# Patient Record
Sex: Male | Born: 1940
Health system: Southern US, Community
[De-identification: ages and names within clinical notes are randomized; demographics above are authoritative.]

## PROBLEM LIST (undated history)

## (undated) DIAGNOSIS — M1990 Unspecified osteoarthritis, unspecified site: Secondary | ICD-10-CM

## (undated) DIAGNOSIS — M545 Low back pain, unspecified: Secondary | ICD-10-CM

## (undated) DIAGNOSIS — F1010 Alcohol abuse, uncomplicated: Secondary | ICD-10-CM

## (undated) DIAGNOSIS — N189 Chronic kidney disease, unspecified: Secondary | ICD-10-CM

## (undated) DIAGNOSIS — G8929 Other chronic pain: Secondary | ICD-10-CM

## (undated) DIAGNOSIS — K921 Melena: Secondary | ICD-10-CM

## (undated) DIAGNOSIS — I509 Heart failure, unspecified: Secondary | ICD-10-CM

## (undated) DIAGNOSIS — K7460 Unspecified cirrhosis of liver: Secondary | ICD-10-CM

## (undated) DIAGNOSIS — J189 Pneumonia, unspecified organism: Secondary | ICD-10-CM

## (undated) DIAGNOSIS — K4090 Unilateral inguinal hernia, without obstruction or gangrene, not specified as recurrent: Secondary | ICD-10-CM

## (undated) DIAGNOSIS — K652 Spontaneous bacterial peritonitis: Secondary | ICD-10-CM

## (undated) DIAGNOSIS — I10 Essential (primary) hypertension: Secondary | ICD-10-CM

## (undated) DIAGNOSIS — D7589 Other specified diseases of blood and blood-forming organs: Secondary | ICD-10-CM

## (undated) DIAGNOSIS — I499 Cardiac arrhythmia, unspecified: Secondary | ICD-10-CM

## (undated) HISTORY — DX: Unilateral inguinal hernia, without obstruction or gangrene, not specified as recurrent: K40.90

## (undated) HISTORY — DX: Essential (primary) hypertension: I10

## (undated) HISTORY — DX: Unspecified osteoarthritis, unspecified site: M19.90

## (undated) HISTORY — DX: Melena: K92.1

## (undated) HISTORY — DX: Unspecified cirrhosis of liver: K74.60

## (undated) HISTORY — DX: Spontaneous bacterial peritonitis: K65.2

---

## 1998-01-17 ENCOUNTER — Emergency Department (HOSPITAL_COMMUNITY): Admission: EM | Admit: 1998-01-17 | Discharge: 1998-01-17 | Payer: Self-pay | Admitting: *Deleted

## 1998-01-24 ENCOUNTER — Encounter: Payer: Self-pay | Admitting: Emergency Medicine

## 1998-01-24 ENCOUNTER — Inpatient Hospital Stay (HOSPITAL_COMMUNITY): Admission: EM | Admit: 1998-01-24 | Discharge: 1998-01-28 | Payer: Self-pay | Admitting: Emergency Medicine

## 1998-01-24 ENCOUNTER — Encounter: Payer: Self-pay | Admitting: Family Medicine

## 1998-01-25 ENCOUNTER — Encounter: Payer: Self-pay | Admitting: Family Medicine

## 1998-02-02 ENCOUNTER — Encounter: Admission: RE | Admit: 1998-02-02 | Discharge: 1998-02-02 | Payer: Self-pay | Admitting: Family Medicine

## 1998-02-04 ENCOUNTER — Encounter: Admission: RE | Admit: 1998-02-04 | Discharge: 1998-02-04 | Payer: Self-pay | Admitting: Family Medicine

## 1998-03-04 ENCOUNTER — Encounter: Admission: RE | Admit: 1998-03-04 | Discharge: 1998-03-04 | Payer: Self-pay | Admitting: Family Medicine

## 1998-04-07 ENCOUNTER — Encounter: Admission: RE | Admit: 1998-04-07 | Discharge: 1998-04-07 | Payer: Self-pay | Admitting: Sports Medicine

## 1998-06-03 ENCOUNTER — Inpatient Hospital Stay (HOSPITAL_COMMUNITY): Admission: AD | Admit: 1998-06-03 | Discharge: 1998-06-05 | Payer: Self-pay | Admitting: Family Medicine

## 1998-06-19 ENCOUNTER — Encounter: Admission: RE | Admit: 1998-06-19 | Discharge: 1998-06-19 | Payer: Self-pay | Admitting: Family Medicine

## 1998-07-17 ENCOUNTER — Encounter: Admission: RE | Admit: 1998-07-17 | Discharge: 1998-07-17 | Payer: Self-pay | Admitting: Family Medicine

## 1998-10-08 ENCOUNTER — Inpatient Hospital Stay (HOSPITAL_COMMUNITY): Admission: EM | Admit: 1998-10-08 | Discharge: 1998-10-10 | Payer: Self-pay | Admitting: Emergency Medicine

## 1998-10-08 ENCOUNTER — Encounter: Payer: Self-pay | Admitting: Emergency Medicine

## 1999-01-26 ENCOUNTER — Encounter: Payer: Self-pay | Admitting: Sports Medicine

## 1999-01-26 ENCOUNTER — Inpatient Hospital Stay (HOSPITAL_COMMUNITY): Admission: EM | Admit: 1999-01-26 | Discharge: 1999-01-28 | Payer: Self-pay | Admitting: *Deleted

## 1999-01-28 ENCOUNTER — Encounter: Payer: Self-pay | Admitting: Sports Medicine

## 1999-02-03 ENCOUNTER — Encounter: Admission: RE | Admit: 1999-02-03 | Discharge: 1999-02-03 | Payer: Self-pay | Admitting: Family Medicine

## 1999-05-24 ENCOUNTER — Encounter: Admission: RE | Admit: 1999-05-24 | Discharge: 1999-05-24 | Payer: Self-pay | Admitting: Family Medicine

## 1999-05-25 ENCOUNTER — Encounter: Payer: Self-pay | Admitting: Sports Medicine

## 1999-05-25 ENCOUNTER — Encounter: Admission: RE | Admit: 1999-05-25 | Discharge: 1999-05-25 | Payer: Self-pay | Admitting: Sports Medicine

## 1999-06-03 ENCOUNTER — Encounter: Admission: RE | Admit: 1999-06-03 | Discharge: 1999-06-03 | Payer: Self-pay | Admitting: Family Medicine

## 1999-12-29 ENCOUNTER — Encounter: Admission: RE | Admit: 1999-12-29 | Discharge: 1999-12-29 | Payer: Self-pay | Admitting: Family Medicine

## 2000-01-28 ENCOUNTER — Encounter: Admission: RE | Admit: 2000-01-28 | Discharge: 2000-01-28 | Payer: Self-pay | Admitting: Family Medicine

## 2000-01-31 ENCOUNTER — Encounter: Admission: RE | Admit: 2000-01-31 | Discharge: 2000-01-31 | Payer: Self-pay | Admitting: Family Medicine

## 2000-03-08 ENCOUNTER — Inpatient Hospital Stay (HOSPITAL_COMMUNITY): Admission: AD | Admit: 2000-03-08 | Discharge: 2000-03-10 | Payer: Self-pay | Admitting: Family Medicine

## 2000-03-08 ENCOUNTER — Encounter: Admission: RE | Admit: 2000-03-08 | Discharge: 2000-03-08 | Payer: Self-pay | Admitting: Family Medicine

## 2000-04-10 ENCOUNTER — Encounter: Payer: Self-pay | Admitting: Emergency Medicine

## 2000-04-10 ENCOUNTER — Inpatient Hospital Stay (HOSPITAL_COMMUNITY): Admission: EM | Admit: 2000-04-10 | Discharge: 2000-04-12 | Payer: Self-pay | Admitting: Emergency Medicine

## 2000-04-19 ENCOUNTER — Encounter: Admission: RE | Admit: 2000-04-19 | Discharge: 2000-04-19 | Payer: Self-pay | Admitting: Family Medicine

## 2000-05-26 ENCOUNTER — Encounter: Admission: RE | Admit: 2000-05-26 | Discharge: 2000-05-26 | Payer: Self-pay | Admitting: Family Medicine

## 2000-06-30 ENCOUNTER — Encounter: Admission: RE | Admit: 2000-06-30 | Discharge: 2000-06-30 | Payer: Self-pay | Admitting: Family Medicine

## 2000-10-11 ENCOUNTER — Encounter: Admission: RE | Admit: 2000-10-11 | Discharge: 2000-10-11 | Payer: Self-pay | Admitting: Family Medicine

## 2000-12-06 ENCOUNTER — Encounter: Admission: RE | Admit: 2000-12-06 | Discharge: 2000-12-06 | Payer: Self-pay | Admitting: Family Medicine

## 2001-07-16 ENCOUNTER — Encounter: Admission: RE | Admit: 2001-07-16 | Discharge: 2001-07-16 | Payer: Self-pay | Admitting: Family Medicine

## 2001-10-24 ENCOUNTER — Encounter: Admission: RE | Admit: 2001-10-24 | Discharge: 2001-10-24 | Payer: Self-pay | Admitting: Family Medicine

## 2002-01-21 ENCOUNTER — Encounter: Admission: RE | Admit: 2002-01-21 | Discharge: 2002-01-21 | Payer: Self-pay | Admitting: Family Medicine

## 2002-02-05 ENCOUNTER — Encounter: Admission: RE | Admit: 2002-02-05 | Discharge: 2002-02-05 | Payer: Self-pay | Admitting: Family Medicine

## 2002-04-08 ENCOUNTER — Encounter: Admission: RE | Admit: 2002-04-08 | Discharge: 2002-04-08 | Payer: Self-pay | Admitting: Sports Medicine

## 2002-06-05 ENCOUNTER — Encounter: Admission: RE | Admit: 2002-06-05 | Discharge: 2002-06-05 | Payer: Self-pay | Admitting: Family Medicine

## 2002-07-15 ENCOUNTER — Encounter: Admission: RE | Admit: 2002-07-15 | Discharge: 2002-07-15 | Payer: Self-pay | Admitting: Family Medicine

## 2002-07-19 ENCOUNTER — Encounter: Admission: RE | Admit: 2002-07-19 | Discharge: 2002-07-19 | Payer: Self-pay | Admitting: Family Medicine

## 2002-09-26 ENCOUNTER — Encounter: Admission: RE | Admit: 2002-09-26 | Discharge: 2002-09-26 | Payer: Self-pay | Admitting: Family Medicine

## 2003-01-10 ENCOUNTER — Encounter: Admission: RE | Admit: 2003-01-10 | Discharge: 2003-01-10 | Payer: Self-pay | Admitting: Family Medicine

## 2003-01-14 ENCOUNTER — Encounter: Payer: Self-pay | Admitting: Sports Medicine

## 2003-01-14 ENCOUNTER — Encounter: Admission: RE | Admit: 2003-01-14 | Discharge: 2003-01-14 | Payer: Self-pay | Admitting: Sports Medicine

## 2003-01-23 ENCOUNTER — Encounter: Admission: RE | Admit: 2003-01-23 | Discharge: 2003-01-23 | Payer: Self-pay | Admitting: Family Medicine

## 2003-03-04 ENCOUNTER — Encounter: Admission: RE | Admit: 2003-03-04 | Discharge: 2003-03-04 | Payer: Self-pay | Admitting: Family Medicine

## 2003-06-30 ENCOUNTER — Encounter: Admission: RE | Admit: 2003-06-30 | Discharge: 2003-06-30 | Payer: Self-pay | Admitting: Family Medicine

## 2003-07-31 ENCOUNTER — Encounter: Admission: RE | Admit: 2003-07-31 | Discharge: 2003-07-31 | Payer: Self-pay | Admitting: Sports Medicine

## 2003-08-05 ENCOUNTER — Inpatient Hospital Stay (HOSPITAL_COMMUNITY): Admission: EM | Admit: 2003-08-05 | Discharge: 2003-08-08 | Payer: Self-pay | Admitting: *Deleted

## 2003-08-05 ENCOUNTER — Encounter: Admission: RE | Admit: 2003-08-05 | Discharge: 2003-08-05 | Payer: Self-pay | Admitting: Family Medicine

## 2003-08-12 ENCOUNTER — Encounter: Admission: RE | Admit: 2003-08-12 | Discharge: 2003-08-12 | Payer: Self-pay | Admitting: Sports Medicine

## 2003-09-11 ENCOUNTER — Encounter: Admission: RE | Admit: 2003-09-11 | Discharge: 2003-09-11 | Payer: Self-pay | Admitting: Sports Medicine

## 2004-01-28 ENCOUNTER — Ambulatory Visit: Payer: Self-pay | Admitting: Family Medicine

## 2004-03-24 ENCOUNTER — Ambulatory Visit: Payer: Self-pay | Admitting: Family Medicine

## 2004-08-11 ENCOUNTER — Ambulatory Visit: Payer: Self-pay | Admitting: Family Medicine

## 2004-12-23 ENCOUNTER — Ambulatory Visit: Payer: Self-pay | Admitting: Family Medicine

## 2004-12-23 ENCOUNTER — Encounter: Admission: RE | Admit: 2004-12-23 | Discharge: 2004-12-23 | Payer: Self-pay | Admitting: Sports Medicine

## 2005-01-07 ENCOUNTER — Ambulatory Visit: Payer: Self-pay | Admitting: Family Medicine

## 2005-03-09 ENCOUNTER — Ambulatory Visit: Payer: Self-pay | Admitting: Family Medicine

## 2005-06-09 ENCOUNTER — Ambulatory Visit: Payer: Self-pay | Admitting: Family Medicine

## 2005-09-01 ENCOUNTER — Ambulatory Visit: Payer: Self-pay | Admitting: Family Medicine

## 2006-02-10 ENCOUNTER — Ambulatory Visit: Payer: Self-pay | Admitting: Family Medicine

## 2006-07-13 DIAGNOSIS — M1990 Unspecified osteoarthritis, unspecified site: Secondary | ICD-10-CM

## 2006-07-13 DIAGNOSIS — I10 Essential (primary) hypertension: Secondary | ICD-10-CM | POA: Insufficient documentation

## 2006-07-13 DIAGNOSIS — M109 Gout, unspecified: Secondary | ICD-10-CM | POA: Insufficient documentation

## 2006-09-26 ENCOUNTER — Encounter (INDEPENDENT_AMBULATORY_CARE_PROVIDER_SITE_OTHER): Payer: Self-pay | Admitting: Family Medicine

## 2006-09-26 ENCOUNTER — Ambulatory Visit: Payer: Self-pay | Admitting: Family Medicine

## 2006-09-26 LAB — CONVERTED CEMR LAB
ALT: 38 units/L (ref 0–53)
Alkaline Phosphatase: 84 units/L (ref 39–117)
CO2: 22 meq/L (ref 19–32)
Cholesterol: 177 mg/dL (ref 0–200)
LDL Cholesterol: 73 mg/dL (ref 0–99)
Sodium: 139 meq/L (ref 135–145)
Total Bilirubin: 1 mg/dL (ref 0.3–1.2)
Total Protein: 7.8 g/dL (ref 6.0–8.3)
VLDL: 36 mg/dL (ref 0–40)

## 2006-09-27 ENCOUNTER — Encounter (INDEPENDENT_AMBULATORY_CARE_PROVIDER_SITE_OTHER): Payer: Self-pay | Admitting: Family Medicine

## 2007-04-30 ENCOUNTER — Ambulatory Visit: Payer: Self-pay | Admitting: Sports Medicine

## 2007-04-30 ENCOUNTER — Encounter (INDEPENDENT_AMBULATORY_CARE_PROVIDER_SITE_OTHER): Payer: Self-pay | Admitting: Family Medicine

## 2007-04-30 DIAGNOSIS — F17200 Nicotine dependence, unspecified, uncomplicated: Secondary | ICD-10-CM

## 2007-04-30 LAB — CONVERTED CEMR LAB
Chloride: 102 meq/L (ref 96–112)
Potassium: 4.6 meq/L (ref 3.5–5.3)

## 2007-05-01 ENCOUNTER — Encounter (INDEPENDENT_AMBULATORY_CARE_PROVIDER_SITE_OTHER): Payer: Self-pay | Admitting: Family Medicine

## 2007-05-04 ENCOUNTER — Encounter (INDEPENDENT_AMBULATORY_CARE_PROVIDER_SITE_OTHER): Payer: Self-pay | Admitting: Family Medicine

## 2007-05-07 ENCOUNTER — Encounter (INDEPENDENT_AMBULATORY_CARE_PROVIDER_SITE_OTHER): Payer: Self-pay | Admitting: Family Medicine

## 2007-06-14 ENCOUNTER — Ambulatory Visit: Payer: Self-pay | Admitting: Family Medicine

## 2007-06-14 ENCOUNTER — Encounter (INDEPENDENT_AMBULATORY_CARE_PROVIDER_SITE_OTHER): Payer: Self-pay | Admitting: Family Medicine

## 2007-06-14 LAB — CONVERTED CEMR LAB
CO2: 21 meq/L (ref 19–32)
Chloride: 103 meq/L (ref 96–112)
Glucose, Bld: 93 mg/dL (ref 70–99)
Potassium: 4.2 meq/L (ref 3.5–5.3)
Sodium: 138 meq/L (ref 135–145)

## 2007-06-18 ENCOUNTER — Encounter (INDEPENDENT_AMBULATORY_CARE_PROVIDER_SITE_OTHER): Payer: Self-pay | Admitting: Family Medicine

## 2007-07-26 ENCOUNTER — Ambulatory Visit: Payer: Self-pay | Admitting: Family Medicine

## 2007-07-26 ENCOUNTER — Encounter (INDEPENDENT_AMBULATORY_CARE_PROVIDER_SITE_OTHER): Payer: Self-pay | Admitting: Family Medicine

## 2007-07-26 LAB — CONVERTED CEMR LAB
CO2: 24 meq/L (ref 19–32)
Calcium: 9.3 mg/dL (ref 8.4–10.5)
Creatinine, Ser: 1.19 mg/dL (ref 0.40–1.50)
Glucose, Bld: 109 mg/dL — ABNORMAL HIGH (ref 70–99)

## 2007-07-27 ENCOUNTER — Encounter (INDEPENDENT_AMBULATORY_CARE_PROVIDER_SITE_OTHER): Payer: Self-pay | Admitting: Family Medicine

## 2007-08-28 ENCOUNTER — Ambulatory Visit: Payer: Self-pay | Admitting: Family Medicine

## 2007-11-20 ENCOUNTER — Ambulatory Visit: Payer: Self-pay | Admitting: Family Medicine

## 2008-02-28 ENCOUNTER — Ambulatory Visit: Payer: Self-pay | Admitting: Family Medicine

## 2008-04-25 ENCOUNTER — Ambulatory Visit: Payer: Self-pay | Admitting: Family Medicine

## 2008-04-29 ENCOUNTER — Ambulatory Visit: Payer: Self-pay | Admitting: Family Medicine

## 2008-04-29 ENCOUNTER — Encounter: Payer: Self-pay | Admitting: Family Medicine

## 2008-04-30 LAB — CONVERTED CEMR LAB
Calcium: 9.2 mg/dL (ref 8.4–10.5)
Creatinine, Ser: 1.13 mg/dL (ref 0.40–1.50)
HDL: 60 mg/dL (ref >39)
Triglycerides: 88 mg/dL (ref <150)
Uric Acid, Serum: 12.2 mg/dL — ABNORMAL HIGH (ref 4.0–7.8)

## 2008-05-12 ENCOUNTER — Telehealth: Payer: Self-pay | Admitting: Family Medicine

## 2008-06-30 ENCOUNTER — Ambulatory Visit: Payer: Self-pay | Admitting: Family Medicine

## 2008-06-30 DIAGNOSIS — E785 Hyperlipidemia, unspecified: Secondary | ICD-10-CM | POA: Insufficient documentation

## 2008-07-01 ENCOUNTER — Ambulatory Visit: Payer: Self-pay | Admitting: Family Medicine

## 2008-07-01 ENCOUNTER — Encounter: Payer: Self-pay | Admitting: Family Medicine

## 2008-07-01 LAB — CONVERTED CEMR LAB: Uric Acid, Serum: 11.9 mg/dL — ABNORMAL HIGH (ref 4.0–7.8)

## 2008-08-04 ENCOUNTER — Ambulatory Visit: Payer: Self-pay | Admitting: Family Medicine

## 2008-08-07 ENCOUNTER — Ambulatory Visit: Payer: Self-pay | Admitting: Gastroenterology

## 2008-08-07 ENCOUNTER — Telehealth: Payer: Self-pay | Admitting: Gastroenterology

## 2008-08-11 ENCOUNTER — Telehealth: Payer: Self-pay | Admitting: Gastroenterology

## 2008-08-25 ENCOUNTER — Ambulatory Visit: Payer: Self-pay | Admitting: Gastroenterology

## 2008-08-25 ENCOUNTER — Encounter: Payer: Self-pay | Admitting: Gastroenterology

## 2008-08-27 ENCOUNTER — Encounter: Payer: Self-pay | Admitting: Gastroenterology

## 2008-09-10 ENCOUNTER — Ambulatory Visit: Payer: Self-pay | Admitting: Family Medicine

## 2008-09-10 LAB — CONVERTED CEMR LAB: Hgb A1c MFr Bld: 5.1 %

## 2008-10-21 ENCOUNTER — Ambulatory Visit: Payer: Self-pay | Admitting: Family Medicine

## 2008-10-21 ENCOUNTER — Encounter: Payer: Self-pay | Admitting: Family Medicine

## 2008-11-10 LAB — CONVERTED CEMR LAB
ALT: 45 units/L (ref 0–53)
AST: 40 units/L — ABNORMAL HIGH (ref 0–37)
CO2: 24 meq/L (ref 19–32)
Chloride: 99 meq/L (ref 96–112)
Sodium: 133 meq/L — ABNORMAL LOW (ref 135–145)
Total Bilirubin: 0.6 mg/dL (ref 0.3–1.2)
Total Protein: 8.1 g/dL (ref 6.0–8.3)
Uric Acid, Serum: 8.6 mg/dL — ABNORMAL HIGH (ref 4.0–7.8)

## 2009-01-02 ENCOUNTER — Ambulatory Visit: Payer: Self-pay | Admitting: Family Medicine

## 2009-01-02 ENCOUNTER — Encounter: Payer: Self-pay | Admitting: Family Medicine

## 2009-01-02 LAB — CONVERTED CEMR LAB
BUN: 9 mg/dL (ref 6–23)
CO2: 24 meq/L (ref 19–32)
Calcium: 10 mg/dL (ref 8.4–10.5)
Creatinine, Ser: 1.12 mg/dL (ref 0.40–1.50)
Glucose, Bld: 102 mg/dL — ABNORMAL HIGH (ref 70–99)

## 2009-01-07 ENCOUNTER — Ambulatory Visit: Payer: Self-pay | Admitting: Family Medicine

## 2009-02-11 ENCOUNTER — Encounter: Payer: Self-pay | Admitting: Family Medicine

## 2009-02-11 ENCOUNTER — Ambulatory Visit: Payer: Self-pay | Admitting: Family Medicine

## 2009-02-11 LAB — CONVERTED CEMR LAB
BUN: 18 mg/dL (ref 6–23)
Calcium: 9.3 mg/dL (ref 8.4–10.5)
Creatinine, Ser: 1.07 mg/dL (ref 0.40–1.50)
Glucose, Bld: 89 mg/dL (ref 70–99)
Potassium: 3.8 meq/L (ref 3.5–5.3)
Uric Acid, Serum: 5.6 mg/dL (ref 4.0–7.8)

## 2009-02-12 ENCOUNTER — Encounter: Payer: Self-pay | Admitting: Family Medicine

## 2009-02-18 ENCOUNTER — Ambulatory Visit: Payer: Self-pay | Admitting: Family Medicine

## 2009-06-01 ENCOUNTER — Emergency Department (HOSPITAL_COMMUNITY): Admission: EM | Admit: 2009-06-01 | Discharge: 2009-06-02 | Payer: Self-pay | Admitting: Emergency Medicine

## 2009-06-19 ENCOUNTER — Ambulatory Visit: Payer: Self-pay | Admitting: Family Medicine

## 2009-06-19 LAB — CONVERTED CEMR LAB

## 2009-10-05 ENCOUNTER — Ambulatory Visit: Payer: Self-pay | Admitting: Family Medicine

## 2009-10-13 ENCOUNTER — Encounter: Payer: Self-pay | Admitting: Family Medicine

## 2009-10-13 ENCOUNTER — Ambulatory Visit: Payer: Self-pay | Admitting: Family Medicine

## 2009-10-14 LAB — CONVERTED CEMR LAB
Albumin: 4.1 g/dL (ref 3.5–5.2)
CO2: 25 meq/L (ref 19–32)
Calcium: 9.6 mg/dL (ref 8.4–10.5)
Chloride: 103 meq/L (ref 96–112)
Cholesterol: 163 mg/dL (ref 0–200)
Glucose, Bld: 121 mg/dL — ABNORMAL HIGH (ref 70–99)
LDL Cholesterol: 93 mg/dL (ref 0–99)
Total Protein: 7.5 g/dL (ref 6.0–8.3)
Triglycerides: 66 mg/dL (ref <150)

## 2009-10-26 ENCOUNTER — Encounter: Payer: Self-pay | Admitting: Family Medicine

## 2009-12-31 ENCOUNTER — Encounter: Payer: Self-pay | Admitting: Family Medicine

## 2009-12-31 ENCOUNTER — Ambulatory Visit: Payer: Self-pay | Admitting: Family Medicine

## 2010-01-01 ENCOUNTER — Encounter: Payer: Self-pay | Admitting: Family Medicine

## 2010-01-16 ENCOUNTER — Encounter: Payer: Self-pay | Admitting: Family Medicine

## 2010-01-29 ENCOUNTER — Encounter: Payer: Self-pay | Admitting: Family Medicine

## 2010-01-29 DIAGNOSIS — J45909 Unspecified asthma, uncomplicated: Secondary | ICD-10-CM | POA: Insufficient documentation

## 2010-02-16 ENCOUNTER — Ambulatory Visit: Payer: Self-pay | Admitting: Family Medicine

## 2010-02-21 ENCOUNTER — Emergency Department (HOSPITAL_COMMUNITY): Admission: EM | Admit: 2010-02-21 | Discharge: 2010-02-21 | Payer: Self-pay | Admitting: Emergency Medicine

## 2010-05-31 ENCOUNTER — Ambulatory Visit: Admission: RE | Admit: 2010-05-31 | Discharge: 2010-05-31 | Payer: Self-pay | Source: Home / Self Care

## 2010-05-31 ENCOUNTER — Encounter: Payer: Self-pay | Admitting: Family Medicine

## 2010-05-31 DIAGNOSIS — L2089 Other atopic dermatitis: Secondary | ICD-10-CM | POA: Insufficient documentation

## 2010-06-04 LAB — CONVERTED CEMR LAB
Albumin: 5 g/dL (ref 3.5–5.2)
Alkaline Phosphatase: 104 units/L (ref 39–117)
CO2: 23 meq/L (ref 19–32)
Glucose, Bld: 102 mg/dL — ABNORMAL HIGH (ref 70–99)
Potassium: 4.1 meq/L (ref 3.5–5.3)
Sodium: 133 meq/L — ABNORMAL LOW (ref 135–145)
Total Protein: 8.2 g/dL (ref 6.0–8.3)

## 2010-06-10 ENCOUNTER — Encounter (INDEPENDENT_AMBULATORY_CARE_PROVIDER_SITE_OTHER): Payer: Self-pay | Admitting: *Deleted

## 2010-06-15 NOTE — Assessment & Plan Note (Signed)
Summary: FLU SHOT/KH  Nurse Visit   Vital Signs:  Patient profile:   70 year old male Temp:     98.9 degrees F  Vitals Entered By: Marcell Barlow RN (February 16, 2010 9:49 AM)  Allergies: No Known Drug Allergies  Immunizations Administered:  Influenza Vaccine # 1:    Vaccine Type: Fluvax MCR    Site: left deltoid    Mfr: GlaxoSmithKline    Dose: 0.5 ml    Route: IM    Given by: Marcell Barlow RN    Exp. Date: 11/10/2010    Lot #: [id number]    VIS given: 12/08/09 version given February 16, 2010.  Flu Vaccine Consent Questions:    Do you have a history of severe allergic reactions to this vaccine? no    Any prior history of allergic reactions to egg and/or gelatin? no    Do you have a sensitivity to the preservative Thimersol? no    Do you have a past history of Guillan-Barre Syndrome? no    Do you currently have an acute febrile illness? no    Have you ever had a severe reaction to latex? no    Vaccine information given and explained to patient? yes  Orders Added: 1)  Influenza Vaccine MCR [00025] 2)  Administration Flu vaccine - MCR [id number]

## 2010-06-15 NOTE — Miscellaneous (Signed)
Summary: Update problem list  Clinical Lists Changes  Problems: Removed problem of ASTHMA, PERSISTENT, MILD (ICD-493.90) Added new problem of ASTHMA, PERSISTENT (ICD-493.90)

## 2010-06-15 NOTE — Miscellaneous (Signed)
Summary: Correction in asthma Dx  Clinical Lists Changes  Problems: Removed problem of ASTHMA, UNSPECIFIED (ICD-493.90) Added new problem of ASTHMA, PERSISTENT, MILD (ICD-493.90)

## 2010-06-15 NOTE — Assessment & Plan Note (Signed)
Summary: SWOLLEN KNEE/KH   Vital Signs:  Patient profile:   70 year old male Height:      69 inches Weight:      182.2 pounds BMI:     27.00 Temp:     98.8 degrees F oral Pulse rate:   68 / minute BP sitting:   150 / 71  (left arm) Cuff size:   regular  Vitals Entered By: Levert Feinstein LPN (August 18, 624THL 3:13 PM) CC: left knee pain x 4 days Is Patient Diabetic? No Pain Assessment Patient in pain? yes     Location: left knee   Primary Lillian Tigges:  Eugenie Norrie  MD  CC:  left knee pain x 4 days.  History of Present Illness: 1. L knee pain.  Noticed it on Monday morning when he woke up. Better today than when he called for appt on Tuesday. Denies hitting knee, falling on knee or any other trauma to knee. Taking arthritis meds and Tylenol; have been helping. Able to walk okay now, though stiff. Hurts when moving. Uses walker occasionally. This has happened before, resolved on its own and with meds.   Arthritis also in [facility], but no pain.  Denies other pains, rashes, fevers.  2. Gout. Last took med for acute attack in foot last week. Resolved with med.   3. Asthma. Well controlled on current meds. Hasn't used alb inhaler for a while.   Preventive Screening-Counseling & Management  Alcohol-Tobacco     Smoking Status: never  Current Medications (verified): 1)  Advair Diskus 500-50 Mcg/dose Misc (Fluticasone-Salmeterol) .... Inhale 1 Puff Twice A Day 2)  Albuterol 90 Mcg/act Aers (Albuterol) .... Inhale 2 Puff Using Inhaler Every Four Hours 3)  Lasix 40 Mg Tabs (Furosemide) .... Take 1 Tablet By Mouth Once A Day 4)  Naprosyn 500 Mg Tabs (Naproxen) .Marland Kitchen.. 1 Tablet By Mouth Twice A Day 5)  Norvasc 10 Mg Tabs (Amlodipine Besylate) .... Take 1 Tablet By Mouth Once A Day 6)  Singulair 10 Mg Tabs (Montelukast Sodium) .... Take 1 Tablet By Mouth Once A Day 7)  Lisinopril 40 Mg  Tabs (Lisinopril) .Marland Kitchen.. 1 By Mouth Daily 8)  Coreg 6.25 Mg  Tabs (Carvedilol) .Marland Kitchen.. 1 By Mouth Bid 9)   Colcrys 0.6 Mg Tabs (Colchicine) .... Take 2 Tabs By Mouth At The Onset of A Gout Attack Followed By One Tablet One Hour Later. Continue Daily Until Gout Attack Subsides. 10)  Allopurinol 300 Mg Tabs (Allopurinol) .... Take One By Mouth Daily To Prevent Gout Attacks  Allergies (verified): No Known Drug Allergies  Social History: Just returned for trip to [place]. Lives with sister Stanton Kidney and nephew (now 57 yo) in [place]. Still uses snuff all day. Denies smoking. Drinks beer occ. Walks several miles a day.  Smoking Status:  never  Review of Systems       Denies weakness, headaches, nausea, vision changes, dysuria, hematuria, constipation, hematuria, diarrhea.  Physical Exam  General:  Pleasant, optimistic; well nourished, well hydrated Head:  normocephalic and atraumatic.   Eyes:  vision grossly intact, pupils equal, pupils round, pupils reactive to light, pupils react to accomodation, no optic disk abnormalities, and no retinal abnormalitiies.   Nose:  Erythema medial R turbinate. No edema bilaterally. No rhinorrhea. Mouth:  No teeth. Pharynx pink and mosit. No erythema or lesions.  Neck:  supple, full ROM, and no masses.   Lungs:  Some mild wheezing diffusely L lobe.  R lobe clear. No crackles or dullness. Heart:  normal rate, regular rhythm, no murmur, no gallop, and no rub.   Abdomen:  soft, non-tender, normal bowel sounds, no distention, no masses, no guarding, no rigidity, and no rebound tenderness.   Extremities:  5+ strength upper extremity, 4+ strength lower. L knee: warmth, moderate intraarticular effusion, moderate tenderness laterally; full ROM  Feet: long toenails, no heavy callouses, lesions, erythema, or warmth Neurologic:  cranial nerves II-XII intact.   Gait: limping on L side but able get onto exam table independently Coordination: finger to nose slightly slower L vs. R; fingertap same bilaterally Cervical Nodes:  no anterior cervical adenopathy and no posterior  cervical adenopathy.   Psych:  memory intact for recent and remote, normally interactive, and good eye contact.   Additional Exam:  Repeat BP in room: 140/70   Impression & Recommendations:  Problem # 1:  JOINT EFFUSION, LEFT KNEE (ICD-719.06) Likely due to DJD since relieved with pain meds, afebrile, and resolving symptoms. Rec icing to decrease inflammation. Cont naproxen as needed.  Problem # 2:  GOUT, UNSPECIFIED (ICD-274.9) Controlled on allopurinol & colchicine as needed. Cont. Will check uric acid level today.   His updated medication list for this problem includes:    Naprosyn 500 Mg Tabs (Naproxen) .Marland Kitchen... 1 tablet by mouth twice a day    Colcrys 0.6 Mg Tabs (Colchicine) .Marland Kitchen... Take 2 tabs by mouth at the onset of a gout attack followed by one tablet one hour later. continue daily until gout attack subsides.    Allopurinol 300 Mg Tabs (Allopurinol) .Marland Kitchen... Take one by mouth daily to prevent gout attacks  Orders: [facility]- Est Level  3 [id number]) Uric Acid-[facility] [id number])  Problem # 3:  HYPERTENSION, BENIGN SYSTEMIC (ICD-401.1) BP controlled on current meds. Cont Lasix, Norvasc, lisinopril, & Coreg.  Pt will f/u in 6 months to re-eval.   His updated medication list for this problem includes:    Lasix 40 Mg Tabs (Furosemide) .Marland Kitchen... Take 1 tablet by mouth once a day    Norvasc 10 Mg Tabs (Amlodipine besylate) .Marland Kitchen... Take 1 tablet by mouth once a day    Lisinopril 40 Mg Tabs (Lisinopril) .Marland Kitchen... 1 by mouth daily    Coreg 6.25 Mg Tabs (Carvedilol) .Marland Kitchen... 1 by mouth bid  Orders: [place]- Est Level  3 [id number])  Complete Medication List: 1)  Advair Diskus 500-50 Mcg/dose Misc (Fluticasone-salmeterol) .... Inhale 1 puff twice a day 2)  Albuterol 90 Mcg/act Aers (Albuterol) .... Inhale 2 puff using inhaler every four hours 3)  Lasix 40 Mg Tabs (Furosemide) .... Take 1 tablet by mouth once a day 4)  Naprosyn 500 Mg Tabs (Naproxen) .Marland Kitchen.. 1 tablet by mouth twice a day 5)  Norvasc 10 Mg Tabs (Amlodipine  besylate) .... Take 1 tablet by mouth once a day 6)  Singulair 10 Mg Tabs (Montelukast sodium) .... Take 1 tablet by mouth once a day 7)  Lisinopril 40 Mg Tabs (Lisinopril) .Marland Kitchen.. 1 by mouth daily 8)  Coreg 6.25 Mg Tabs (Carvedilol) .Marland Kitchen.. 1 by mouth bid 9)  Colcrys 0.6 Mg Tabs (Colchicine) .... Take 2 tabs by mouth at the onset of a gout attack followed by one tablet one hour later. continue daily until gout attack subsides. 10)  Allopurinol 300 Mg Tabs (Allopurinol) .... Take one by mouth daily to prevent gout attacks

## 2010-06-15 NOTE — Assessment & Plan Note (Signed)
Summary: [place]   Vital Signs:  Patient profile:   70 year old male Height:      69 inches Weight:      188.4 pounds BMI:     27.92 Temp:     98.6 degrees F oral Pulse rate:   59 / minute BP sitting:   114 / 66  (left arm) Cuff size:   regular  Vitals Entered By: Levert Feinstein LPN (May 23, 624THL [id number] AM) CC: f/u asthma, gout, bp Is Patient Diabetic? No Pain Assessment Patient in pain? no        Primary Care Provider:  Eugenie Norrie  MD  CC:  f/u asthma, gout, and bp.  History of Present Illness: 1. gout Had a flare that started within the last 2 weeks -- pain in the L ankle. Now resolved. Last uric acid as 5.9 Sept 2010. on allopurinol. Take colcyrs and naproxen for acute attacks with good results .  2. Hypertension adherent to medications: yes side effects from medications:no subjective: taking medicines without problems  ROS: chest pain: no      SOB: no   HA: no     swelling: no   3. asthma Taking advair, singulair and albuterol. No increase in usage of albuterol inhaler. Has had a persistent cough from recent URI that is productive of yellow sputum but states that this is slowly improving.   Habits & Providers  Alcohol-Tobacco-Diet     Tobacco Status: current     Tobacco Counseling: to quit use of tobacco products  Current Medications (verified): 1)  Advair Diskus 500-50 Mcg/dose Misc (Fluticasone-Salmeterol) .... Inhale 1 Puff Twice A Day 2)  Albuterol 90 Mcg/act Aers (Albuterol) .... Inhale 2 Puff Using Inhaler Every Four Hours 3)  Lasix 40 Mg Tabs (Furosemide) .... Take 1 Tablet By Mouth Once A Day 4)  Naprosyn 500 Mg Tabs (Naproxen) .Marland Kitchen.. 1 Tablet By Mouth Twice A Day 5)  Norvasc 10 Mg Tabs (Amlodipine Besylate) .... Take 1 Tablet By Mouth Once A Day 6)  Singulair 10 Mg Tabs (Montelukast Sodium) .... Take 1 Tablet By Mouth Once A Day 7)  Lisinopril 40 Mg  Tabs (Lisinopril) .Marland Kitchen.. 1 By Mouth Daily 8)  Coreg 6.25 Mg  Tabs (Carvedilol) .Marland Kitchen.. 1 By Mouth Bid 9)   Colcrys 0.6 Mg Tabs (Colchicine) .... Take 2 Tabs By Mouth At The Onset of A Gout Attack Followed By One Tablet One Hour Later. Continue Daily Until Gout Attack Subsides. 10)  Allopurinol 300 Mg Tabs (Allopurinol) .... Take One By Mouth Daily To Prevent Gout Attacks  Allergies (verified): No Known Drug Allergies  Social History: Smoking Status:  current  Review of Systems       review of systems as noted in HPI section   Physical Exam  General:  vital signs reviewed and normal Alert, appropriate; well-dressed and well-nourished  Lungs:  shallow breaths but clear throughout; good air movement. no wheezes, rales, or ronchi. work of breathing normal Heart:  vital signs reviewed and normal Alert, appropriate; well-dressed and well-nourished  Msk:  L and R ankles non-tender to palpation. No swelling, erythema or warmth Extremities:  no cyanosis, clubbing, or edema    Impression & Recommendations:  Problem # 1:  GOUT, UNSPECIFIED (ICD-274.9)  check uric acid. continue current therapy.   His updated medication list for this problem includes:    Naprosyn 500 Mg Tabs (Naproxen) .Marland Kitchen... 1 tablet by mouth twice a day    Colcrys 0.6 Mg Tabs (Colchicine) .Marland KitchenMarland KitchenMarland KitchenMarland Kitchen  Take 2 tabs by mouth at the onset of a gout attack followed by one tablet one hour later. continue daily until gout attack subsides.    Allopurinol 300 Mg Tabs (Allopurinol) .Marland Kitchen... Take one by mouth daily to prevent gout attacks  Orders: Uric Acid-[facility] [id number]) [place]- Est  Level 4 [id number])  Problem # 2:  HYPERTENSION, BENIGN SYSTEMIC (ICD-401.1)  stable. continue current regimen. BP shows good control today.  His updated medication list for this problem includes:    Lasix 40 Mg Tabs (Furosemide) .Marland Kitchen... Take 1 tablet by mouth once a day    Norvasc 10 Mg Tabs (Amlodipine besylate) .Marland Kitchen... Take 1 tablet by mouth once a day    Lisinopril 40 Mg Tabs (Lisinopril) .Marland Kitchen... 1 by mouth daily    Coreg 6.25 Mg Tabs (Carvedilol) .Marland Kitchen... 1 by mouth  bid  BP today: 114/66 Prior BP: 129/74 (06/19/2009)  Labs Reviewed: K+: 3.8 (02/11/2009) Creat: : 1.07 (02/11/2009)   Chol: 190 (04/29/2008)   HDL: 60 (04/29/2008)   LDL: 112 (04/29/2008)   TG: 88 (04/29/2008)  Orders: [place]- Est  Level 4 [id number])  Problem # 3:  ASTHMA, UNSPECIFIED (ICD-493.90)  quite severe in younger years but has been stable since I have known him. Note that pt is on a nonselective beta blocker but has been stable on this regimen for many years and therefore I am not inclined to alter it.   His updated medication list for this problem includes:    Advair Diskus 500-50 Mcg/dose Misc (Fluticasone-salmeterol) ..... Inhale 1 puff twice a day    Albuterol 90 Mcg/act Aers (Albuterol) ..... Inhale 2 puff using inhaler every four hours    Singulair 10 Mg Tabs (Montelukast sodium) .Marland Kitchen... Take 1 tablet by mouth once a day  Orders: [place]- Est  Level 4 ([id number])  Complete Medication List: 1)  Advair Diskus 500-50 Mcg/dose Misc (Fluticasone-salmeterol) .... Inhale 1 puff twice a day 2)  Albuterol 90 Mcg/act Aers (Albuterol) .... Inhale 2 puff using inhaler every four hours 3)  Lasix 40 Mg Tabs (Furosemide) .... Take 1 tablet by mouth once a day 4)  Naprosyn 500 Mg Tabs (Naproxen) .Marland Kitchen.. 1 tablet by mouth twice a day 5)  Norvasc 10 Mg Tabs (Amlodipine besylate) .... Take 1 tablet by mouth once a day 6)  Singulair 10 Mg Tabs (Montelukast sodium) .... Take 1 tablet by mouth once a day 7)  Lisinopril 40 Mg Tabs (Lisinopril) .Marland Kitchen.. 1 by mouth daily 8)  Coreg 6.25 Mg Tabs (Carvedilol) .Marland Kitchen.. 1 by mouth bid 9)  Colcrys 0.6 Mg Tabs (Colchicine) .... Take 2 tabs by mouth at the onset of a gout attack followed by one tablet one hour later. continue daily until gout attack subsides. 10)  Allopurinol 300 Mg Tabs (Allopurinol) .... Take one by mouth daily to prevent gout attacks  Other Orders: Comp Met-[facility] [id number]) Lipid-[facility] [id number])  Patient Instructions: 1)  come back for a fasting  cholesterol check on Friday 2)  continue your medicines as they are 3)  I'll send in refills to [facility]. 4)  I'll send in a refill for you amlodipine to the pharmacy.  Prescriptions: ALLOPURINOL 300 MG TABS (ALLOPURINOL) take one by mouth daily to prevent gout attacks  #90 Tablet x 1   Entered and Authorized by:   Eugenie Norrie  MD   Signed by:   Eugenie Norrie  MD on 10/05/2009   Method used:   Electronically to        [facility] (retail)       [address], [place]  [id number]       Ph: [phone]       Fax: [phone]   RxID:   [id number]    Prevention & Chronic Care Immunizations   Influenza vaccine: Fluvax Non-MCR  (02/18/2009)   Influenza vaccine due: 02/27/2009    Tetanus booster: 05/17/1999: Done.   Tetanus booster due: 05/16/2009    Pneumococcal vaccine: Pneumovax  (04/25/2008)   Pneumococcal vaccine due: None    H. zoster vaccine: 06/30/2008: refused  Colorectal Screening   Hemoccult: negative  (05/07/2007)   Hemoccult action/deferral: Not indicated  (01/07/2009)   Hemoccult due: 05/2008    Colonoscopy: Location:  [place].    (08/25/2008)   Colonoscopy due: 08/2013  Other Screening   PSA: 2.89  (04/29/2008)   PSA action/deferral: Not indicated  (06/19/2009)   PSA due due: 04/29/2009   Smoking status: current  (10/05/2009)   Smoking cessation counseling: yes  (04/25/2008)  Lipids   Total Cholesterol: 190  (04/29/2008)   LDL: 112  (04/29/2008)   LDL Direct: Not documented   HDL: 60  (04/29/2008)   Triglycerides: 88  (04/29/2008)    SGOT (AST): 40  (10/21/2008)   SGPT (ALT): 45  (10/21/2008) CMP ordered    Alkaline phosphatase: 104  (10/21/2008)   Total bilirubin: 0.6  (10/21/2008)    Lipid flowsheet reviewed?: Yes   Progress toward LDL goal: Unchanged  Hypertension   Last Blood Pressure: 114 / 66  (10/05/2009)   Serum creatinine: 1.07  (02/11/2009)   Serum  potassium 3.8  (02/11/2009) CMP ordered     Hypertension flowsheet reviewed?: Yes   Progress toward BP goal: At goal  Self-Management Support :   Personal Goals (by the next clinic visit) :      Personal blood pressure goal: 140/90  (01/07/2009)     Personal LDL goal: 100  (01/07/2009)    Hypertension self-management support: Written self-care plan  (02/11/2009)    Hypertension self-management support not done because: Good outcomes  (06/19/2009)    Lipid self-management support: Written self-care plan  (02/11/2009)     Lipid self-management support not done because: Good outcomes  (06/19/2009)

## 2010-06-15 NOTE — Letter (Signed)
Summary: Generic Letter  [facility]  [address]   [place], [place] [address]   Phone: [phone]  Fax: [phone]    01/01/2010  Donald Moreno [address] [place], [place]  [address]  Dear Mr. Copher,  Your uric acid level yesterday was 7.4 mg/dL, on the high side of the normal range, 4.0-7.8 mg/dL. If you have more frequent gout flares than usual or your knee does not improve, please come in and see me.  It was a pleasure to meet you yesterday.   Sincerely,   Karen Kays MD  Appended Document: Generic Letter mailed.

## 2010-06-15 NOTE — Miscellaneous (Signed)
Summary: chart update  Pt needs recheck of fasting glucose after labs done 5/11.  Clinical Lists Changes  Observations: Added new observation of PRIMARY MD: Eugenie Norrie  MD (10/26/2009 11:18)

## 2010-06-15 NOTE — Assessment & Plan Note (Signed)
Summary: [place]   Vital Signs:  Patient profile:   70 year old male Height:      69 inches Weight:      185 pounds BMI:     27.42 BSA:     2.00 Temp:     98.5 degrees F Pulse rate:   51 / minute BP sitting:   129 / 74  Vitals Entered By: Christen Bame CMA (June 19, 2009 10:25 AM) CC: f/u Is Patient Diabetic? No Pain Assessment Patient in pain? no        Primary Care Provider:  Eugenie Norrie  MD  CC:  f/u.  History of Present Illness: 1. nosebleed Seen at ED a few weeks ago for nosebleed that he couldn't get to stop. Since then has bought a humidifier and nasal saline and hasn't had any further problems.   2. Asthma stable on current regimen. Taking advair, singulair, albuterol.   3. gout On allopurinol 300 mg daily. Last uric acid 5.8. Stable. No recent flares. Takes naproxen and colcrys with acute flares.   4. HTN  well controlled today on coreg, norvasc and lasix.  ROS: no chest pain. Breathing is at baseline. Some occasional joint aches. No back pain.   Habits & Providers  Alcohol-Tobacco-Diet     Tobacco Status: never  Current Medications (verified): 1)  Advair Diskus 500-50 Mcg/dose Misc (Fluticasone-Salmeterol) .... Inhale 1 Puff Twice A Day 2)  Albuterol 90 Mcg/act Aers (Albuterol) .... Inhale 2 Puff Using Inhaler Every Four Hours 3)  Lasix 40 Mg Tabs (Furosemide) .... Take 1 Tablet By Mouth Once A Day 4)  Naprosyn 500 Mg Tabs (Naproxen) .Marland Kitchen.. 1 Tablet By Mouth Twice A Day 5)  Norvasc 10 Mg Tabs (Amlodipine Besylate) .... Take 1 Tablet By Mouth Once A Day 6)  Singulair 10 Mg Tabs (Montelukast Sodium) .... Take 1 Tablet By Mouth Once A Day 7)  Lisinopril 40 Mg  Tabs (Lisinopril) .Marland Kitchen.. 1 By Mouth Daily 8)  Coreg 6.25 Mg  Tabs (Carvedilol) .Marland Kitchen.. 1 By Mouth Bid 9)  Colcrys 0.6 Mg Tabs (Colchicine) .... Take 2 Tabs By Mouth At The Onset of A Gout Attack Followed By One Tablet One Hour Later. Continue Daily Until Gout Attack Subsides. 10)  Allopurinol 300 Mg  Tabs (Allopurinol) .... Take One By Mouth Daily To Prevent Gout Attacks  Allergies (verified): No Known Drug Allergies  Family History: Reviewed history from 09/26/2006 and no changes required. Asthma - pretty much everyone in his family CAD - Sister with CAD prior to 62 yo No DM, no CA.  Social History: Reviewed history from 09/10/2008 and no changes required. Lives with sister Stanton Kidney in [place], uses snuff, no smoke or drugs.  1-2 beers a day.  10th-grade education.  Frequently travels to [place] and [place] (by bus) to see his kids, plans trip in about 3 months.  3 daughters in [place], 1 son in [place] with 6 grandkids.  walks a mile daily unless it rains   Physical Exam  General:  General:  Vital signs reviewed -- normal except slightly bradycardic Alert, appropriate; well-dressed and well-nourished Neck:  no carotid bruits, no JVD, no tenderness or masses Lungs:  work of breathing unlabored, clear to auscultation bilaterally; no wheezes, rales, or ronchi; shallow respirations but o/w normal  Heart:  regular rate and rhythm, no murmurs; normal s1/s2 Pulses:  DP and radial pulses 2+ bilaterally  Extremities:  no cyanosis, clubbing, or edema Neurologic:  alert and oriented. speech normal.  Additional Exam:  General:  Vital signs reviewed -- normal except slightly bradycardic Alert, appropriate; well-dressed and well-nourished Nose: nasal mucosa slightly red and boggy, no active bleeding or clotted blood.  Lungs:  work of breathing unlabored, clear to auscultation bilaterally except for occasional mild wheeze; no rales, or ronchi; shallow respirations but o/w normal  Heart:  regular rate and rhythm, no murmurs; normal s1/s2    Impression & Recommendations:  Problem # 1:  NOSEBLEED (ICD-784.7) Assessment New  resolved. Encouraged humidified air and nasal saline.   Orders: [facility]- Est  Level 4 [id number])  Problem # 2:  ASTHMA, UNSPECIFIED (ICD-493.90) Assessment: Unchanged  stable, continue  current meds.   His updated medication list for this problem includes:    Advair Diskus 500-50 Mcg/dose Misc (Fluticasone-salmeterol) ..... Inhale 1 puff twice a day    Albuterol 90 Mcg/act Aers (Albuterol) ..... Inhale 2 puff using inhaler every four hours    Singulair 10 Mg Tabs (Montelukast sodium) .Marland Kitchen... Take 1 tablet by mouth once a day  Orders: [facility]- Est  Level 4 [id number])  Problem # 3:  HYPERTENSION, BENIGN SYSTEMIC (ICD-401.1) Assessment: Unchanged  stable, continue current meds.  His updated medication list for this problem includes:    Lasix 40 Mg Tabs (Furosemide) .Marland Kitchen... Take 1 tablet by mouth once a day    Norvasc 10 Mg Tabs (Amlodipine besylate) .Marland Kitchen... Take 1 tablet by mouth once a day    Lisinopril 40 Mg Tabs (Lisinopril) .Marland Kitchen... 1 by mouth daily    Coreg 6.25 Mg Tabs (Carvedilol) .Marland Kitchen... 1 by mouth bid  Orders: [facility]- Est  Level 4 [id number])  Problem # 4:  GOUT, UNSPECIFIED (ICD-274.9) Assessment: Unchanged  stasble continue current meds.   His updated medication list for this problem includes:    Naprosyn 500 Mg Tabs (Naproxen) .Marland Kitchen... 1 tablet by mouth twice a day    Colcrys 0.6 Mg Tabs (Colchicine) .Marland Kitchen... Take 2 tabs by mouth at the onset of a gout attack followed by one tablet one hour later. continue daily until gout attack subsides.    Allopurinol 300 Mg Tabs (Allopurinol) .Marland Kitchen... Take one by mouth daily to prevent gout attacks  Orders: [facility]- Est  Level 4 (99214)  Complete Medication List: 1)  Advair Diskus 500-50 Mcg/dose Misc (Fluticasone-salmeterol) .... Inhale 1 puff twice a day 2)  Albuterol 90 Mcg/act Aers (Albuterol) .... Inhale 2 puff using inhaler every four hours 3)  Lasix 40 Mg Tabs (Furosemide) .... Take 1 tablet by mouth once a day 4)  Naprosyn 500 Mg Tabs (Naproxen) .Marland Kitchen.. 1 tablet by mouth twice a day 5)  Norvasc 10 Mg Tabs (Amlodipine besylate) .... Take 1 tablet by mouth once a day 6)  Singulair 10 Mg Tabs (Montelukast sodium) .... Take 1 tablet by mouth once  a day 7)  Lisinopril 40 Mg Tabs (Lisinopril) .Marland Kitchen.. 1 by mouth daily 8)  Coreg 6.25 Mg Tabs (Carvedilol) .Marland Kitchen.. 1 by mouth bid 9)  Colcrys 0.6 Mg Tabs (Colchicine) .... Take 2 tabs by mouth at the onset of a gout attack followed by one tablet one hour later. continue daily until gout attack subsides. 10)  Allopurinol 300 Mg Tabs (Allopurinol) .... Take one by mouth daily to prevent gout attacks  Patient Instructions: 1)  follow-up with me in 6 months 2)  you look great   Prevention & Chronic Care Immunizations   Influenza vaccine: Fluvax Non-MCR  (02/18/2009)   Influenza vaccine due: 02/27/2009    Tetanus booster: 05/17/1999: Done.  Tetanus booster due: 05/16/2009    Pneumococcal vaccine: Pneumovax  (04/25/2008)   Pneumococcal vaccine due: None    H. zoster vaccine: 06/30/2008: refused  Colorectal Screening   Hemoccult: negative  (05/07/2007)   Hemoccult action/deferral: Not indicated  (01/07/2009)   Hemoccult due: 05/2008    Colonoscopy: Location:  [facility].    (08/25/2008)   Colonoscopy due: 08/2013  Other Screening   PSA: 2.89  (04/29/2008)   PSA action/deferral: Not indicated  (06/19/2009)   PSA due due: 04/29/2009   Smoking status: never  (06/19/2009)  Lipids   Total Cholesterol: 190  (04/29/2008)   LDL: 112  (04/29/2008)   LDL Direct: Not documented   HDL: 60  (04/29/2008)   Triglycerides: 88  (04/29/2008)    SGOT (AST): 40  (10/21/2008)   SGPT (ALT): 45  (10/21/2008)   Alkaline phosphatase: 104  (10/21/2008)   Total bilirubin: 0.6  (10/21/2008)    Lipid flowsheet reviewed?: Yes   Progress toward LDL goal: Unchanged  Hypertension   Last Blood Pressure: 129 / 74  (06/19/2009)   Serum creatinine: 1.07  (02/11/2009)   Serum potassium 3.8  (02/11/2009)    Hypertension flowsheet reviewed?: Yes   Progress toward BP goal: At goal  Self-Management Support :   Personal Goals (by the next clinic visit) :      Personal blood pressure goal:  140/90  (01/07/2009)     Personal LDL goal: 100  (01/07/2009)    Hypertension self-management support: Written self-care plan  (02/11/2009)    Hypertension self-management support not done because: Good outcomes  (06/19/2009)    Lipid self-management support: Written self-care plan  (02/11/2009)     Lipid self-management support not done because: Good outcomes  (06/19/2009)

## 2010-06-17 NOTE — Assessment & Plan Note (Signed)
Summary: f/u hypertension, starting Chantix for snuff, gout/arthritis   Vital Signs:  Patient profile:   70 year old male Height:      69 inches Weight:      186.8 pounds BMI:     27.69 Temp:     98.8 degrees F oral Pulse rate:   65 / minute BP sitting:   124 / 74  (left arm) Cuff size:   regular  Vitals Entered By: Levert Feinstein LPN (January 16, [id number] 1:53 PM) CC: f/u meds Is Patient Diabetic? No Pain Assessment Patient in pain? no        Primary Provider:  Karen Kays MD  CC:  f/u meds.  History of Present Illness: 1. Abdominal rash Past several months. Keeps itching it while sleeping and during day. Has tried diaper ointment on it.  Denies fevers, drainage.  2. Arthritis Controlled with Naproxen occasionally. Has taken almost 120 tablets over past 6 months, so a fewer than 1 daily.  Denies any pain now.   3. Gout No recent flares (past 6 months).  Taking allopurinol and colchicine as needed (has not needed since he last saw me).   4. HTN Taking lasix, norvasc, lisinopril, and coreg. Denies chest pain, edema, ligthheadedness, dyspnea.   5. Mild persistent asthma Has not needed albuterol past 6 months. Also on Advair daily.   6. Wants to quit using snuff. Interested in Chantix which he tried before.     Other ROS: denies dysuria, hematuria  Preventive Screening-Counseling & Management  Alcohol-Tobacco     Alcohol drinks/day: 2     Alcohol type: beer     Alcohol Counseling: to decrease amount and/or frequency of alcohol intake     Smoking Status: never     Smoking Cessation Counseling: yes     Cans of tobacco/week: yes     Passive Smoke Exposure: no     Tobacco Counseling: to quit use of tobacco products  Allergies: No Known Drug Allergies  Past History:  Past Medical History: dupuytren`s contracture L hand,  Asthma--on Advair and albuterol as needed   Frequent past hospitalization for exacerbations... intubated X2 (5/99, 9/99) for  asthma HTN   (01/16) taking torsemide, norvasc, lisinopril, coreg Arthritis--controlled with occasional naproxen Gout--on allopurinol and colchicine prn  A1c 5.1 09/10/08  Social History: Sons are about to Writer from college in [place]. Lives with sister Stanton Kidney and nephew (now 22 yo) in [place]. Still uses snuff all day. Denies smoking. Drinks beer 1-2 daily; used to drink whiskey. CAGE negative.  Walks several miles a day.    Physical Exam  General:  alert, well-developed, well-nourished, and well-hydrated.   Head:  normocephalic and atraumatic.   Eyes:  vision intact, sclera cloudy (no jaundiced) Mouth:  no teeth, no exudates/erythema/plaques/other lesions, no LAD Neck:  no LAD Lungs:  CTAB, no w/r/r Heart:  RRR, no murmurs Abdomen:  soft, distended, NABS   Impression & Recommendations:  Problem # 1:  DERMATITIS, ATOPIC (ICD-691.8) Assessment New Seems more atopic. Recommend Eucerin cream instead of diaper ointment. Also may be contact dermatitis. Belt hits rash. Recommended not using belt and keeping pressure off area.   Problem # 2:  NICOTINE ADDICTION (ICD-305.1) Assessment: Improved Rx for Chat[place]. Patients wants to quit. Will follow-up in 3 months. Given red flags about side affects. Patient aware. Tried in past.   His updated medication list for this problem includes:    Chantix Starting Month Pak 0.5 Mg X 11 & 1 Mg X 42  Tabs (Varenicline tartrate) .Marland Kitchen... Take 0.5mg  once daily x3 days. then take 0.5mg  twice daily x4days. then take 1mg  twice daily from then on.    Chantix 1 Mg Tabs (Varenicline tartrate) .Marland Kitchen... Take 1 tablet twice daily.  Orders: [facility]- Est Level  3 [id number])  Problem # 3:  HYPERTENSION, BENIGN SYSTEMIC (ICD-401.1) Assessment: Improved BP good today. Will decrease loop dose and change to torsemide. Patient may finish lasix but at half the dose. Will f/u 3 months.   His updated medication list for this problem includes:    Torsemide 10 Mg Tabs  (Torsemide) .Marland Kitchen... Take 1 fluid pill daily for your high blood pressure.    Norvasc 10 Mg Tabs (Amlodipine besylate) .Marland Kitchen... Take 1 tablet by mouth once a day    Lisinopril 40 Mg Tabs (Lisinopril) .Marland Kitchen... 1 by mouth daily    Coreg 6.25 Mg Tabs (Carvedilol) .Marland Kitchen... 1 by mouth bid  Problem # 4:  HEALTH MAINTENANCE EXAM (ICD-V70.0) Given tetanus shot today.   Problem # 5:  GOUT, UNSPECIFIED (ICD-274.9) Assessment: Improved Controlled with no recent flares. Continue allopurinol and colchicine as needed.   His updated medication list for this problem includes:    Naprosyn 500 Mg Tabs (Naproxen) .Marland Kitchen... 1 tablet by mouth twice a day    Colcrys 0.6 Mg Tabs (Colchicine) .Marland Kitchen... Take 2 tabs by mouth at the onset of a gout attack followed by one tablet one hour later. continue daily until gout attack subsides.    Allopurinol 300 Mg Tabs (Allopurinol) .Marland Kitchen... Take one by mouth daily to prevent gout attacks  Orders: [facility]- Est Level  3 [id number])  Problem # 6:  ARTHRITIS (ICD-716.90) Assessment: Improved Osteoarthritis controlled on naproxen. Will check Cr today.   Orders: [facility]- Est Level  3 [id number])  Complete Medication List: 1)  Advair Diskus 500-50 Mcg/dose Misc (Fluticasone-salmeterol) .... Inhale 1 puff twice a day 2)  Albuterol 90 Mcg/act Aers (Albuterol) .... Inhale 2 puff using inhaler every four hours 3)  Torsemide 10 Mg Tabs (Torsemide) .... Take 1 fluid pill daily for your high blood pressure. 4)  Naprosyn 500 Mg Tabs (Naproxen) .Marland Kitchen.. 1 tablet by mouth twice a day 5)  Norvasc 10 Mg Tabs (Amlodipine besylate) .... Take 1 tablet by mouth once a day 6)  Singulair 10 Mg Tabs (Montelukast sodium) .... Take 1 tablet by mouth once a day 7)  Lisinopril 40 Mg Tabs (Lisinopril) .Marland Kitchen.. 1 by mouth daily 8)  Coreg 6.25 Mg Tabs (Carvedilol) .Marland Kitchen.. 1 by mouth bid 9)  Colcrys 0.6 Mg Tabs (Colchicine) .... Take 2 tabs by mouth at the onset of a gout attack followed by one tablet one hour later. continue daily until gout  attack subsides. 10)  Allopurinol 300 Mg Tabs (Allopurinol) .... Take one by mouth daily to prevent gout attacks 11)  Chantix Starting Month Pak 0.5 Mg X 11 & 1 Mg X 42 Tabs (Varenicline tartrate) .... Take 0.5mg  once daily x3 days. then take 0.5mg  twice daily x4days. then take 1mg  twice daily from then on. 12)  Chantix 1 Mg Tabs (Varenicline tartrate) .... Take 1 tablet twice daily.  Other Orders: [facility] [id number])  Patient Instructions: 1)  I am so happy to hear your wanting to quit dipping. I sent a prescription for Chantix. The first month you take a starter pack. The next 2 months, you take Chantix 1 mg twice a day. Take the medicine with food. I hope you would stop using dip right away, but at the very least,  stop using dip within 1 week of starting the medicine. If you have any thoughts of hurting yourself, get chest pain, or develop a rash, please call the clinic or got the ED if severe. 2)  I am decreasing your fluid pill by half and switching it to a different one (called torsemide). So, take your remaining lasix (the one I marked with a star), 1/2 tablet daily until you finish. After that, you will start taking the torsemide 10mg  once a day.  3)  Try Eucerin cream (you can get from the pharmacy or grocery store) daily for your rash, and try not using your belt and not letting any clothing or other belts irritate that area.  4)  Please come back and see me in 3 months. We'll take a look at your blood pressure then and talk about your using dip.  Prescriptions: CHANTIX 1 MG TABS (VARENICLINE TARTRATE) Take 1 tablet twice daily.  #60 x 7   Entered and Authorized by:   Karen Kays MD   Signed by:   Sheral Flow Park MD on 05/31/2010   Method used:   Electronically to        [facility] (retail)       [address], [place]  [id number]       Ph: [phone]       Fax: [phone]   RxID:   [id number] CHANTIX STARTING MONTH PAK 0.5  MG X 11 & 1 MG X 42 TABS (VARENICLINE TARTRATE) Take 0.5mg  once daily x3 days. Then take 0.5mg  twice daily x4days. Then take 1mg  twice daily from then on.  #1 pack x 0   Entered and Authorized by:   Karen Kays MD   Signed by:   Sheral Flow Park MD on 05/31/2010   Method used:   Electronically to        [facility] (retail)       [address], [place]  [id number]       Ph: [phone]       Fax: [phone]   RxID:   [id number] TORSEMIDE 10 MG TABS (TORSEMIDE) Take 1 fluid pill daily for your high blood pressure.  #90 x 2   Entered and Authorized by:   Karen Kays MD   Signed by:   Sheral Flow Park MD on 05/31/2010   Method used:   Electronically to        [facility] (retail)       [address], [place]  [id number]       Ph: [phone]       Fax: [phone]   RxID:   [id number] LISINOPRIL 40 MG  TABS (LISINOPRIL) 1 by mouth daily  #90 x 3   Entered and Authorized by:   Karen Kays MD   Signed by:   Sheral Flow Park MD on 05/31/2010   Method used:   Electronically to        [facility] (retail)       [address], [place]  [id number]       Ph: [phone]       Fax: [phone]   RxID:   [id number] COLCRYS 0.6 MG TABS (COLCHICINE) take 2 tabs by mouth at the onset of a gout attack  followed by one tablet one hour later. Continue daily until gout attack subsides.  #30 x 2   Entered and Authorized by:   Karen Kays MD   Signed by:   Sheral Flow Park MD on 05/31/2010   Method used:   Electronically to        [facility] (retail)       [address], [place]  [id number]       Ph: [phone]       Fax: [phone]   RxID:   [id number] ALLOPURINOL 300 MG TABS (ALLOPURINOL) take one by mouth daily to prevent gout attacks  #90 Tablet x 1   Entered and Authorized by:   Karen Kays MD    Signed by:   Sheral Flow Park MD on 05/31/2010   Method used:   Electronically to        [facility] (retail)       [address], [place]  [id number]       Ph: [phone]       Fax: [phone]   RxID:   [id number] SINGULAIR 10 MG TABS (MONTELUKAST SODIUM) Take 1 tablet by mouth once a day  #90 Tablet x 5   Entered and Authorized by:   Karen Kays MD   Signed by:   Sheral Flow Park MD on 05/31/2010   Method used:   Electronically to        [facility] (retail)       [address], [place]  [id number]       Ph: [phone]       Fax: [phone]   RxID:   [id number] NAPROSYN 500 MG TABS (NAPROXEN) 1 tablet by mouth twice a day  #60 x 1   Entered and Authorized by:   Karen Kays MD   Signed by:   Sheral Flow Park MD on 05/31/2010   Method used:   Electronically to        [facility] (retail)       [address], [place]  [id number]       Ph: [phone]       Fax: [phone]   RxID:   [id number] COREG 6.25 MG  TABS (CARVEDILOL) 1 by mouth bid  #180 Tablet x 5   Entered and Authorized by:   Karen Kays MD   Signed by:   Sheral Flow Park MD on 05/31/2010   Method used:   Electronically to        [facility] (retail)       [address], [place]  [id number]       Ph: [phone]       Fax: [phone]   RxID:   [id number] NORVASC 10 MG TABS (AMLODIPINE BESYLATE) Take 1 tablet by mouth once a day  #90 Tablet x 3   Entered and Authorized by:   Karen Kays MD   Signed by:   Sheral Flow Park MD on 05/31/2010   Method used:   Electronically to        [facility] (retail)       [address]       [place], [place]  [id number]  Ph: [phone]       Fax: [phone]   [id number]    Orders Added: 1)  [facility]- Est Level  3 [99213] 2)  [facility]  [id number]  Appended Document: f/u hypertension, starting Chantix for snuff, gout/arthritis   Tetanus/Td Vaccine    Vaccine Type: Td    Site: right deltoid    Mfr: [place]    Dose: 0.5 ml    Route: IM    Given by: Levert Feinstein LPN    Exp. Date: 06/11/2011    Lot #: [id number]    VIS given: 04/02/08 version given May 31, 2010.

## 2010-06-17 NOTE — Letter (Signed)
Summary: Generic Letter  [facility]  [address]   [place], [place] [address]   Phone: [phone]  Fax: [phone]    06/10/2010  [address] [place], [place]  [address]  Dear Donald Moreno,  We are happy to let you know that since you are covered under Medicare you are able to have a FREE visit at the [facility] to discuss your HEALTH. This is a new benefit for Medicare.  There will be no co-payment.  At this visit you will meet with [name] an expert in wellness and the health coach at our clinic.  At this visit we will discuss ways to keep you healthy and feeling well.  This visit will not replace your regular doctor visit and we cannot refill medications.     You will need to plan to be here at least one hour to talk about your medical history, your current status, review all of your medications, and discuss your future plans for your health.  This information will be entered into your record for your doctor to have and review.  If you are interested in staying healthy, this type of visit can help.  Please call the office at: [phone], to schedule a "Medicare Wellness Visit".  The day of the visit you should bring in all of your medications, including any vitamins, herbs, over the counter products you take.  Make a list of all the other doctors that you see, so we know who they are. If you have any other health documents please bring them.  We look forward to helping you stay healthy.  Sincerely,   Suzanne Lineberry [place]

## 2010-06-24 ENCOUNTER — Encounter: Payer: Self-pay | Admitting: Home Health Services

## 2010-06-24 ENCOUNTER — Ambulatory Visit (INDEPENDENT_AMBULATORY_CARE_PROVIDER_SITE_OTHER): Payer: No Typology Code available for payment source | Admitting: Home Health Services

## 2010-06-24 VITALS — BP 151/76 | Temp 98.8°F | Ht 70.0 in | Wt 190.2 lb

## 2010-06-24 DIAGNOSIS — Z0000 Encounter for general adult medical examination without abnormal findings: Secondary | ICD-10-CM

## 2010-06-24 NOTE — Progress Notes (Signed)
Patient here for annual wellness visit, he reports: Does not have any other doctors.  Independence in ADL and IADL Lives with his sister and nephew in 1-story home.  Reports no falls. He walked every other day 1.5 miles or for an hour.  He wears seat belts. Patient does not have any teeth and does not wear his dentures.  He does not have any problems with hearing. He admits to difficulty of vision while reading and to not have an eye doctor.  Encouraged patient to schedule an eye exam. He does not have smoke alarms in the home.   He does not have any adaptive equipment.   He admits to 2 cans of beer daily and never drives after drinking.  His diet is high in animal protein and starches. His BMI is 27. He reports that when he first moved to [facility] 15 years ago that he was 150 lbs and wanted to gain weight.  Patient set the goal to lose 5 lbs.  He denies depression, denies prolems with her memory. He has discussed his wishes for end of life care with sister Labarron Pianka, she would call her in the case of an emergency.  Gave patient an advance directive pamphlet. He did not ever receive the zostavax shot.  Gave patient pamphlet and encourage to ask his pharmacist at [facility] about insurance coverage.  He identified his health problems as: gout, asthma, hypertension, and arthritis. He did not bring his medications for review and could not name the medicines that he is currently taking. Encourage patient to bring all his medication to his next PCP appointment.     Balance max value value  Sitting balance 1 1  Arise 2 2  Attempts to arise 2 2  Immediate standing balance 2 2  Standing balance 1 1  Nudge 2 2  Eyes closed 1 1  360 degree turn 1 1  Sitting down 2 2    Gait max value value  Initiation of gait 1 1  Step length-left 1 1  Step length-right 1 1  Step height-left 1 1  Step height-right 1 1  Step symmetry 1 1  Step continuity 1 1  Path 2 2  Trunk 2 2  Walking  stance 1 1    Mental Status Exam max value value  Orientation to time 5 5  Orientation to place 5 5  Registration 3 5  Attention 5 5  Recall 3 3  Language (name 2 objects) 2 2  Language-repeat 1 1  Language-follow 3 step command 3 3  Language-read and follow directions 1 1

## 2010-06-24 NOTE — Patient Instructions (Addendum)
1. Try to decease your how much salt you eat every day. 2. Try to eat vegetables 3 times a week. 3. Try to lose 5 pounds. 4. Purchase 2 smoke detectors for your house. 5. Make an appointment with an eye doctor.  6. Talk to your pharmacist at [facility] about the shingles shot.

## 2010-06-29 ENCOUNTER — Encounter: Payer: Self-pay | Admitting: Home Health Services

## 2010-07-28 ENCOUNTER — Encounter: Payer: Self-pay | Admitting: Home Health Services

## 2010-08-26 ENCOUNTER — Encounter: Payer: Self-pay | Admitting: Family Medicine

## 2010-08-27 ENCOUNTER — Ambulatory Visit (INDEPENDENT_AMBULATORY_CARE_PROVIDER_SITE_OTHER): Payer: No Typology Code available for payment source | Admitting: Family Medicine

## 2010-08-27 ENCOUNTER — Encounter: Payer: Self-pay | Admitting: Family Medicine

## 2010-08-27 VITALS — BP 117/67 | HR 67 | Temp 99.4°F | Wt 187.5 lb

## 2010-08-27 DIAGNOSIS — M109 Gout, unspecified: Secondary | ICD-10-CM

## 2010-08-27 DIAGNOSIS — L2089 Other atopic dermatitis: Secondary | ICD-10-CM

## 2010-08-27 DIAGNOSIS — F17200 Nicotine dependence, unspecified, uncomplicated: Secondary | ICD-10-CM

## 2010-08-27 DIAGNOSIS — J45909 Unspecified asthma, uncomplicated: Secondary | ICD-10-CM

## 2010-08-27 DIAGNOSIS — I10 Essential (primary) hypertension: Secondary | ICD-10-CM

## 2010-08-27 MED ORDER — HYDROCORTISONE 1 % EX CREA: TOPICAL_CREAM | CUTANEOUS | Status: DC

## 2010-08-27 NOTE — Patient Instructions (Addendum)
Please stop taking the amlodipine (Norvasc). Please measure your blood pressures in a few days at the pharmacy. If your blood pressure is higher than 150 (top number) or 90 (bottom number), please call the clinic and let me know.  For your rash, please use the steroid cream once a day. If it does not get better, you can try it twice a day.  Please check your stool for blood using those cards.  Please follow-up with me in 3 months.

## 2010-08-29 ENCOUNTER — Encounter: Payer: Self-pay | Admitting: Family Medicine

## 2010-08-29 MED ORDER — ALLOPURINOL 300 MG PO TABS: 300.0000 mg | ORAL_TABLET | Freq: Every day | ORAL | Status: DC

## 2010-08-29 MED ORDER — ALBUTEROL 90 MCG/ACT IN AERS: 1.0000 | INHALATION_SPRAY | RESPIRATORY_TRACT | Status: DC | PRN

## 2010-08-29 MED ORDER — FLUTICASONE-SALMETEROL 500-50 MCG/DOSE IN AEPB: 1.0000 | INHALATION_SPRAY | Freq: Two times a day (BID) | RESPIRATORY_TRACT | Status: DC

## 2010-08-29 MED ORDER — TORSEMIDE 10 MG PO TABS: 10.0000 mg | ORAL_TABLET | Freq: Every day | ORAL | Status: DC

## 2010-08-29 MED ORDER — LISINOPRIL 40 MG PO TABS: 40.0000 mg | ORAL_TABLET | Freq: Every day | ORAL | Status: DC

## 2010-08-29 MED ORDER — CARVEDILOL 6.25 MG PO TABS: 6.2500 mg | ORAL_TABLET | Freq: Two times a day (BID) | ORAL | Status: DC

## 2010-08-29 MED ORDER — MONTELUKAST SODIUM 10 MG PO TABS: 10.0000 mg | ORAL_TABLET | Freq: Every day | ORAL | Status: DC

## 2010-08-29 MED ORDER — VARENICLINE TARTRATE 1 MG PO TABS: 1.0000 mg | ORAL_TABLET | Freq: Two times a day (BID) | ORAL | Status: DC

## 2010-08-29 NOTE — Assessment & Plan Note (Signed)
Patient aware of risks (of oropharyngeal diseases, including cancer) but still uses snuff. Not ready to quit completely at this time but feels using Chantix has helped decreased his usage (has cut back to 1/3 of what he previously was using). Would like to continue Chantix. Will refill.  Denies visit with pharmacologist for cessation recommendations.

## 2010-08-29 NOTE — Progress Notes (Signed)
  Subjective:    Patient ID: Donald Moreno, male    DOB: 1940-10-07, 70 y.o.   MRN: [id number]  HPI Routine 3 month follow-up of chronic issues:  1. HTN Compliant with all meds: norvasc, lisiniopril, torsemide, coreg. Had stopped one medication last visit 3 months ago secondary to good BPs. Today, BP is also good. ROS: denies falls, chest pain, dizziness  2. Preventative Still discussing AD given to him by [name] at recent visit. As of now, would like all interventions done acutely but would like ventilation/life support discontinued if more than 48 hours.   Does not want shingles vaccine.  3. Gout No recent episodes. Compliant with allopurinol.   4. Snuff use Still using but less after starting Chantix. Now one box lasts him 3 days whereas before would only last him 1 day.   5. Severe asthma requiring past intubation Compliant with Advair and Singular. Has not needed albuterol recently.   6. Atopic dermatitis Using thick cream twice daily. Only wears his belt was he is going out. But still has rash on belly and now on his left shoulder.  Itches and feels very dry. ROS: denies fever, erythema, wamrth   Review of Systems     Objective:   Physical Exam Gen: NAD, pleasant, good spirits Neuro: fully alert and oriented, appropriate to all questions; steady gait; slight resting tremor in hands bilaterally CV: RRR Resp: no increased WOB, CTAB Skin: thickened, scaley, non-erythematous/non-warm rash transversely across abdomen about where the waist of his pants his; similar rash on left shoulder area Ext: no pedal edema     Assessment & Plan:

## 2010-08-29 NOTE — Assessment & Plan Note (Signed)
Rash about the same as previously on belly and now new rash on shoulder.  Rx for steroid cream. Continue using emollient, especially after bathing.

## 2010-08-29 NOTE — Assessment & Plan Note (Signed)
Controlled.  Continue allopurinol daily and colchicine prn.

## 2010-08-29 NOTE — Assessment & Plan Note (Signed)
Unclear why patient has been on so many blood pressure medications. His blood pressure today is actually lower than I would like in this elderly patient.  Will discontinue Norvasc. Continue lisinopril, torsemide, and Coreg. Asked patient to measure BP in a few days and if high, then to call office and re-start Norvasc.  Will follow-up in 3 months.

## 2010-08-29 NOTE — Assessment & Plan Note (Signed)
Controlled.  Continue current therapy.  

## 2010-09-03 LAB — HEMOCCULT GUIAC POC 1CARD (OFFICE): Card #2 Fecal Occult Blod, POC: NEGATIVE

## 2010-09-06 ENCOUNTER — Other Ambulatory Visit (INDEPENDENT_AMBULATORY_CARE_PROVIDER_SITE_OTHER): Payer: No Typology Code available for payment source | Admitting: Family Medicine

## 2010-09-06 DIAGNOSIS — I10 Essential (primary) hypertension: Secondary | ICD-10-CM

## 2010-09-06 DIAGNOSIS — Z1211 Encounter for screening for malignant neoplasm of colon: Secondary | ICD-10-CM

## 2010-09-06 MED ORDER — CARVEDILOL 6.25 MG PO TABS: 6.2500 mg | ORAL_TABLET | Freq: Two times a day (BID) | ORAL | Status: DC

## 2010-09-09 NOTE — Progress Notes (Signed)
Addended by: Martin[place], Wilson Dusenbery on: 09/09/2010 05:20 PM   Modules accepted: Orders

## 2010-10-01 NOTE — Discharge Summary (Signed)
[place]  Patient:    Donald Moreno, Donald Moreno                       MRN: [id number] Adm. Date:  [id number] Disc. Date: [id number] Attending:  Tiburcio Pea Dictator:   Lyda Jester, M.D. CC:         Pecolia Ades, M.D.   Discharge Summary  CONSULTING PHYSICIANS:  None.  PROCEDURE:  None.  DISCHARGE DIAGNOSES: 1. Asthma acute exacerbation. 2. Elevated blood pressures without diagnosis of hypertension.  DISCHARGE MEDICATIONS: 1. Singulair 10 mg p.o. q.d. 2. Flovent 220 mcg MDI two puffs b.i.d. 3. Serevent two puffs b.i.d. 4. Albuterol meter dose inhaler two puffs q.4-6h. p.r.n. 5. Prednisone 20 mg three pills everyday x 5 days then stop.  BRIEF HISTORY:  The patient is a 70 year old male who presents with a three-day history of worsening asthma symptoms, increasing shortness of breath, wheezing, and chest tightness.  He denied any upper respiratory infection symptoms.  He states that his current exacerbation was triggered by the change in the weather and working outside in the heat.  He was using his albuterol nebulizers q.3h. at home, but has not used the Singulair for one month because the drug people told him it was not indicated for his disease and therefore not covered by his medication assistance program.  He has a history of frequent hospitalizations with the last hospitalization in December of 2000 and a history of intubations x 2.  His last asthma exacerbation was December of 2000.  LABORATORY DATA:  Peak flow prealbuterol and Atrovent treatment was 0, posttreatment was 140, 150, and 170.  HOSPITAL COURSE:  Asthma exacerbation.  He was admitted to a nontelemetry bed with continuous pulse oximetry.  He did not have any evidence of acute infection, therefore, no antibiotics were started.  He was continued on albuterol and Atrovent nebulizers q.6h. as well as Solu-Medrol IV, and then was changed to p.o. prednisone for a  five-day 60 mg burst.  His oxygen saturation on admission was 93%.  I am not sure if that was on room air.  He did the following day have oxygen saturations up to 98% on room air.  Chest x-ray was obtained to rule out any infiltrate and it showed just COPD changes with no acute infiltrate, so he was not placed on antibiotics.  His examination was much improved on the day of discharge and he had much less wheezing on examination.  He did receive a flu shot prior to discharge and was felt stable for discharge home.  Elevated blood pressures.  His blood pressure on admission was 163/95. Over his hospitalization, his blood pressures did go down into the 120s to 130s over 70s to 80s.  Therefore it was felt that his admission hypertension was secondary to his acute respiratory distress, but this will need to be followed up on an outpatient basis.  DISPOSITION:  The patient was discharged to home in stable condition.  DISCHARGE INSTRUCTIONS:  He was instructed to call his doctor if he should have increased difficulty breathing or his inhalers are not helping.  FOLLOW-UP:  He is scheduled to follow up with Dr. Gust Rung at the [facility] on Friday, March 17, 2000, at 2:20 p.m. DD:  08/06/00 TD:  08/07/00 Job: [id number]

## 2010-10-01 NOTE — Discharge Summary (Signed)
Donald Moreno, Donald Moreno                          ACCOUNT NO.:  [id number]   MEDICAL RECORD NO.:  [id number]                   PATIENT TYPE:  INP   LOCATION:  [id number]                                 FACILITY:  [facility]   PHYSICIAN:  Manus Rudd, MD                    DATE OF BIRTH:  1940-09-03   DATE OF ADMISSION:  08/05/2003  DATE OF DISCHARGE:  08/08/2003                                 DISCHARGE SUMMARY   PRIMARY CARE PHYSICIAN:  Remo Lipps A. Lavella Hammock, M.D. at [facility].   PROCEDURE:  None.   DISCHARGE MEDICATIONS:  1. Advair 500/50, one puff b.i.d.  2. Albuterol MDI spacer, two puffs p.r.n. as needed.  3. HCTZ 25 mg, one tab p.o. q.d.  4. Norvasc 10 mg, one tab p.o. q.d.  5. Singulair 10 mg, one tab p.o. q.d.  6. Prednisone 60 mg once daily for six days.  7. Avelox 400 mg once daily for four days.   DISCHARGE DIAGNOSES:  1. Acute asthma exacerbation.  2. Right lower lobe pneumonia.  3. Hypertension.  4. Arthritis.  5. Elevated CBGs with a hemoglobin A1C of 6.1.  6. Elevated creatinine at 1.4.  7. Elevated liver function tests.   LABORATORY VALUES:  Creatinine 1.4, hemoglobin A1C 6.1, sodium 135,  potassium 4, chloride 103, CO2 24, sugars 300's, BUN 22, calcium 9, AST 32,  ALT 64, alkaline phosphatase 83, T protein 7.6, albumin 3.5, T bilirubin  1.1.  Cardiac markers negative times three.  Acute hepatitis panel negative.  Hepatitis B surface antigen negative.  Hepatitis B core antibody IgM  negative.  Hepatitis A negative.  Hepatitis C negative.   HISTORY OF PRESENT ILLNESS:  Please see full dictation for further details  but in short, Donald Moreno is a 70 year old African-American male with a  known history of asthma his entire life with history of multiple intubations  in the past.  The patient had recently been seen in the outpatient clinic  and was treated for an asthma exacerbation with p.o. steroid burst.  Patient  said the morning prior to admission he took  his last steroid pill and he  noticed he began having a pain in his right lower ribs with cough.  He  describes the pain as sharp and stabbing and only present with cough,  although he does admit that it hurts occasionally with twisting and he gets  no relief except for when he stops coughing.  The patient returned to the  clinic on the day of admission with increased work of breathing, respiratory  rate in the 40's, O2 saturation 93 on room air.  He was diaphoretic and had  diffuse wheezing on pulmonary exam.  The patient did report a sore throat  the week prior to admission but said that this was resolved, and he had  taken some over-the-counter cold tablets in addition to his regular  medications which did include Advair, Singulair and albuterol.   HOSPITAL COURSE:  Problem #1:  Asthma.  Donald Moreno was admitted and started  on IV Solu-Medrol 120 q.12h. for the first day and then was transitioned to  prednisone 60 q.d.  He was initially started on albuterol and Atrovent nebs  q.4h. for the first day and then transitioned to just albuterol nebs q.6h.  for the second day and q.8h. on the third day, and on the day of discharge  he said that he was back to his baseline work of breathing, was able to  ambulate well with saturation in the high 90s on room air.  However, on  physical exam he continued to have diffuse wheezing but had greatly improved  air movement.  Patient states that he chronically wheezes.  Patient is  extremely compliant with his medications and was provided with albuterol MDI  with spacer prior to his discharge.  He was continued on a full 10 day  course of prednisone as well as a full seven day course of Avelox for the  right lower lobe pneumonia that developed on chest x-ray.   Problem #2:  Right lower lobe pneumonia.  His initial chest x-ray on  admission showed a questionable right lower lobe pneumonia which was more  evident on day two.  So the patient was treated  with a seven day course of  Avelox.  At the time of discharge the patient had no crackles or rhonchi in  the right lower lobe and this was clear to auscultation.  The patient was  afebrile throughout his hospital course.   Problem #3:  Elevated CBG.  Throughout his hospital course the patient had  elevated CBG in the 300s.  I think this was likely secondary to his acute  illness, as well as the ample amount of steroids that he received.  A  hemoglobin A1C was checked and was 6.1. This was borderline for diabetes.  However, diabetes should not be diagnosed in the presence of acute illness  and in hospitalization.  Also felt that given his past history of having  frequent bouts of steroids in several illnesses over the past several months  this was likely to raise his A1C and they recommend to follow up as an  outpatient.   Problem #4:  Hypertension.  His hypertension was stable throughout the  hospital stay and he was maintained on HCTZ and Norvasc.   Problem #5:  Elevated creatinine.  The patient's creatinine on admission was  1.3 then rose to 1.4.  It stabilized there and was not responsive to fluid  boluses.  I do not; however, know where the patient's baseline was.  It was  felt that this could be further worked up as an outpatient as needed.   Problem #6:  Elevated LFT's.  The patient on admission had an elevated ALT  in a 2:1 ratio to AST.  An acute hepatitis panel was checked and everything  was negative.  The patient was complaining of acute right upper quadrant  pain on admission and this resolved and was completely bone by day two.  No  further workup of the patient's elevated LFT's was done during this hospital  admission.   Problem #7:  Right upper quadrant pain/rib pain.  It was felt that the  patient's pain represented multiple strain versus costochondritis secondary  to his coughing and also he had a right lower lobe pneumonia which could result in some diaphragmatic  irritation  resulting in right upper quadrant  pain.  His pain was completely gone by day two of admission.   INSTRUCTION TO PATIENT AND FAMILY:  The patient was instructed to complete  his course of prednisone and Avelox and was instructed to use his albuterol  as needed.  He was instructed to call Dr. Remo Lipps A. Greer on Monday next  week for a followup next week.  I was unable to make this appointment myself  secondary to today being a holiday and the clinic being closed.                                                Manus Rudd, MD    SJ/MEDQ  D:  08/08/2003  T:  08/10/2003  Job:  [id number]   cc:   Remo Lipps A. Lavella Hammock, M.D.  Fam. Med - Resident - [place], [place] [address]  Fax: [phone]

## 2010-10-01 NOTE — H&P (Signed)
[place]  Patient:    Donald Moreno, Donald Moreno                       MRN: [id number] Adm. Date:  [id number] Attending:  Tiburcio Pea Dictator:   Lucas Mallow, M.D. CC:         Pecolia Ades, M.D.   History and Physical  HISTORY OF PRESENT ILLNESS:  Donald Moreno is a 70 year old black male with a longstanding history of asthma.  He has had multiple hospitalizations in the past and most recent was in December 2000.  He has also had two intubations in 1999.  His asthma has actually been fairly well controlled in the past year. His last exacerbation was with that hospitalization in December 2000.  CURRENT MEDICATIONS: 1. Albuterol nebulizers as needed. 2. Flovent 220 mcg, two puffs b.i.d. 3. Serevent two puffs b.i.d. 4. Singulair 10 mg q.d., although he has not taken the Singulair in    over one month, because the person providing him with his medications    told him that this medication was not indicated for his disease.  He reports that in the past three days he has had worsening asthma symptoms, with increasing shortness of breath, wheeze, and chest tightness.  He has not had any upper respiratory infection symptoms.  No fevers or chills.  He thinks that this may have been triggered by the change in weather and working outside in the heat.  He has been using albuterol nebulizer q.3h. at home, with limited improvement in his symptoms.  REVIEW OF SYSTEMS:  Negative for fever and chills.  No weight loss, no chest pain, no change in GI symptoms.  Normal bowel movements.  No bright red blood per rectum.  No melena.  No skin rash.  No joint pain.  No urinary symptoms.  PAST MEDICAL HISTORY:  Significant for asthma, as mentioned above.  ALLERGIES:  No known drug allergies.  SOCIAL HISTORY:  He lives with his sister in [place].  He uses snuff.  Has never smoked or done drugs.  He does use occasional alcohol.  He has a 10th grade education and  frequently travels to the [place] and [place] by bus.  FAMILY HISTORY:  Significant for asthma in multiple family members.  PHYSICAL EXAMINATION:  VITAL SIGNS:  Temperature 99.4 degrees, pulse 95, respirations 32, O2 saturation 93% on room air.  GENERAL:  He is a well-developed, well-nourished black male, in moderate respiratory distress, with obvious increased work of breathing, unable to complete sentences, secondary to shortness of breath.  HEENT:  [place]/AT.  Conjunctivae clear.  Nares patent.  No rhinorrhea.  Tympanic membranes clear.  OP:  With very little remaining dentition.  No erythema or exudate.  NECK:  Supple, no LAD, no TM.  CARDIOVASCULAR:  Tachy, S1, S2.  No N/R/J appreciated.  LUNGS:  Diffuse inspiratory and expiratory wheezes.  He is tachypneic and has poor aeration.  Moderate intercostal retractions and supraclavicular tugging.  ABDOMEN:  Soft, NT/ND, positive BS, no HSM.  SKIN:  No rash.  EXTREMITIES:  No C/C/E.  Peak flow pre-nebulizer treatment is 0 x 3.  Post-nebulizer treatment is 140, 150, and 170.  The patient did receive an albuterol and Atrovent nebulizer, which did improve his symptoms only minimally.  His respiratory rate remained 30, and his O2 saturation remained at 93% on room air.  ASSESSMENT: 1. Asthma exacerbation.  PLAN:  To admit him to a nontelemetry bed  with continuous pulse oximetry. Since he has no evidence of infection at this time, will not start antibiotics. He will be placed on albuterol and Atrovent nebulizers.  He will be given Solu-Medrol 125 mg IV x 1, and then switched to p.o. prednisone tomorrow.  Will check a chest x-ray on admission, to rule out infiltrate.  2. Elevated blood pressure, likely secondary to respiratory distress.    He has had labile blood pressures in the past.  PLAN:  Would recommend following blood pressure once his acute attack resolves, and consider blood pressure medications if it remains  elevated.  The patient is seen and discussed with Dr. Jamal Collin. Hensel. DD:  03/09/00 TD:  03/09/00 Job: [id number]

## 2010-10-01 NOTE — H&P (Signed)
NAMEBASTIAN, BALSBAUGH                          ACCOUNT NO.:  [id number]   MEDICAL RECORD NO.:  [id number]                   PATIENT TYPE:  INP   LOCATION:  [id number]                                 FACILITY:  [facility]   PHYSICIAN:  Jamal Collin. Hensel, M.D.             DATE OF BIRTH:  21-May-1940   DATE OF ADMISSION:  08/05/2003  DATE OF DISCHARGE:                                HISTORY & PHYSICAL   CHIEF COMPLAINT:  Shortness of breath.   HISTORY OF PRESENT ILLNESS:  Mr. Donald Moreno is a 70 year old with a known  history of asthma with multiple intubations in the past.  The patient had  recently been seen in the outpatient clinic and treated for an asthma  exacerbation with p.o. steroid bursts.  The patient said that yesterday  morning after he took his last steroid pill, he noticed that he began having  a pain in his right lower ribs with cough.  He describes the pain as sharp  and stabbing and only present with cough, although he does admit that it  hurts occasionally with twisting and he gets no relief except when he stops  coughing.  The patient returned to the clinic today with increased work of  breathing.  His respiratory rate was 40, O2 saturation was 93% on room air,  he was diaphoretic and had diffuse wheezing on pulmonary exam.  The patient  does report he had a sore throat last week, but says that this has resolved,  and he has taken some over-the-counter cold tablets in addition to his  regular medications which do include Advair and Singulair and albuterol  p.r.n.   REVIEW OF SYSTEMS:  CONSTITUTIONAL:  The patient did complain of chills last  night and loss of 5 pounds over the past week.  CARDIOVASCULAR:  No chest  pain.  No edema.  GI:  No nausea, vomiting or diarrhea.  No blood in his  stool.  NEUROLOGIC:  No tingling or numbness.  He does have some generalized  weakness.  MUSCULOSKELETAL:  Reports that he feels sore all over but says  that this is chronic and he thinks from his  arthritis.  ENT:  Some nasal  congestion.  EYES:  No vision changes.  GU:  No blood or urinary tract  symptoms.  RESPIRATORY:  Positive dyspnea, see above.  SKIN:  No rashes or  lesions.  ENDOCRINE:  No excessive heat or cold sensation.   PAST MEDICAL HISTORY:  1. Hypertension.  2. Asthma.  3. Arthritis.  4. History of intubation x3 or 4.   MEDICATIONS:  1. Advair Diskus 500/50 mcg 1 puff b.i.d.  2. Albuterol p.r.n.  3. Hydrochlorothiazide 25 mg daily.  4. Ibuprofen 800 mg t.i.d. p.r.n.  5. Norvasc 10 mg daily.  6. Singulair 10 mg daily.   ALLERGIES:  No known drug allergies.   FAMILY HISTORY:  Multiple people in his family with asthma.  Denies any  heart disease or diabetes, though he does report a brother who he thinks may  have had a stroke.   SOCIAL HISTORY:  The patient currently lives with his sister here in  [place].  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  He has 5 children, 3 that live in [place] and 1  in [place] and 1 that is deceased.  He has used snuff for approximately 50  years and does not smoke or use drugs.  The patient does drink 1 or 2 beers  a day.   EXAM:  VITAL SIGNS:  Temperature 99.0, heart rate 76, respirations 32, blood  pressure 123/76 and 97% on 2 L.  CONSTITUTIONAL:  The patient is alert and oriented in general, in no acute  distress.  He is somewhat short of breath with speech.  He does appear to  have good insight and judgment.  HEENT:  He is atraumatic, normocephalic.  Pupils are equal, round and  reactive to light and accommodation.  Extraocular movements are intact.  Conjunctivae and lids are normal.  External ears appear normal with clear  TMs bilaterally, though the internal canals do appear somewhat erythematous  but with no drainage.  External nose is normal.  The patient is missing all  of his teeth but his gums appear intact.  Oropharynx is clear.  NECK:  No masses or thyromegaly.   Trachea is midline.  RESPIRATORY:  He has increased work of breathing and is currently  tachypneic.  He has diffuse rhonchi over the entire right lung and at the  base of the left lung and some decreased air movement in the left upper  lung.  He has also some mild expiratory wheezing bilaterally diffusely.  HEART:  Difficult to hear S1 and S2 secondary to respirations.  Extremities  have no edema and inspection of digits is normal.  GI:  Abdomen appears somewhat tender in the right upper quadrant and along  several of the lower lateral ribs.  His abdomen is mildly distended and  tympanic and obese.  No organomegaly is appreciated.  SKIN:  No rashes.  NEUROLOGIC:  Cranial nerves II-XII are intact.  Gross motor intact.   LABORATORY TESTS:  CBC and BMET are pending.   EKG showed a rate of 76 with a normal axis, normal sinus rhythm, no ST-T  wave changes and no Q waves.   Chest x-ray shows a right lower lobe pneumonia.   ASSESSMENT AND PLAN:  1. Acute asthma exacerbation.  As the patient has received an intravenous     dose of Solu-Medrol, plan is to switch to oral, once he is under better     control.  We will also place him on scheduled q.4 h. nebulizers.  We will     also check a sputum culture and follow for any fevers.  The patient has     been started on Avelox and we will order pre and post nebulizer     treatments.  Currently, it appears that his asthma has been exacerbated     by an upper respiratory infection, now probably secondary to a right     lower lobe pneumonia.  2. Right lower lobe pneumonia.  The patient will be started on Avelox today,     as above.  3. Hypertension.  We will continue home hydrochlorothiazide and Norvasc.  4. Distended abdomen with right upper quadrant pain.  We  will check a KUB     and check a CMET.  Consider other sources such as gallbladder disease or     possible rib fracture; we will rule these out by plain film.     Beatrice Lecher,  M.D.                  William A. Andria Frames, M.D.    CM/MEDQ  D:  08/06/2003  T:  08/06/2003  Job:  [id number]   cc:   Remo Lipps A. Lavella Hammock, M.D.  Fam. Med - Resident - [place], [place] [address]  Fax: [phone]

## 2010-11-09 ENCOUNTER — Encounter: Payer: Self-pay | Admitting: Family Medicine

## 2010-11-09 NOTE — Progress Notes (Signed)
  Subjective:    Patient ID: Donald Moreno, male    DOB: June 18, 1940, 70 y.o.   MRN: [id number]  HPI    Review of Systems     Objective:   Physical Exam        Assessment & Plan:  Received note from pharmacy. Patient has not filled Rx on lisinopril.

## 2010-12-13 ENCOUNTER — Ambulatory Visit: Payer: No Typology Code available for payment source | Admitting: Family Medicine

## 2011-01-03 ENCOUNTER — Encounter: Payer: Self-pay | Admitting: Family Medicine

## 2011-01-03 ENCOUNTER — Ambulatory Visit (INDEPENDENT_AMBULATORY_CARE_PROVIDER_SITE_OTHER): Payer: No Typology Code available for payment source | Admitting: Family Medicine

## 2011-01-03 DIAGNOSIS — F17200 Nicotine dependence, unspecified, uncomplicated: Secondary | ICD-10-CM

## 2011-01-03 DIAGNOSIS — L2089 Other atopic dermatitis: Secondary | ICD-10-CM

## 2011-01-03 DIAGNOSIS — I10 Essential (primary) hypertension: Secondary | ICD-10-CM

## 2011-01-03 DIAGNOSIS — Z7189 Other specified counseling: Secondary | ICD-10-CM

## 2011-01-03 DIAGNOSIS — J45909 Unspecified asthma, uncomplicated: Secondary | ICD-10-CM

## 2011-01-03 DIAGNOSIS — M109 Gout, unspecified: Secondary | ICD-10-CM

## 2011-01-03 MED ORDER — HYDROCORTISONE 1 % EX CREA: TOPICAL_CREAM | CUTANEOUS | Status: DC

## 2011-01-03 MED ORDER — ALLOPURINOL 300 MG PO TABS: 300.0000 mg | ORAL_TABLET | Freq: Every day | ORAL | Status: DC

## 2011-01-03 MED ORDER — FLUTICASONE-SALMETEROL 500-50 MCG/DOSE IN AEPB: 1.0000 | INHALATION_SPRAY | Freq: Two times a day (BID) | RESPIRATORY_TRACT | Status: DC

## 2011-01-03 MED ORDER — MONTELUKAST SODIUM 10 MG PO TABS: 10.0000 mg | ORAL_TABLET | Freq: Every day | ORAL | Status: DC

## 2011-01-03 MED ORDER — COLCHICINE 0.6 MG PO TABS: ORAL_TABLET | ORAL | Status: DC

## 2011-01-03 MED ORDER — ALBUTEROL 90 MCG/ACT IN AERS: 1.0000 | INHALATION_SPRAY | RESPIRATORY_TRACT | Status: DC | PRN

## 2011-01-03 MED ORDER — LISINOPRIL 40 MG PO TABS: 40.0000 mg | ORAL_TABLET | Freq: Every day | ORAL | Status: DC

## 2011-01-03 MED ORDER — CARVEDILOL 6.25 MG PO TABS: 6.2500 mg | ORAL_TABLET | Freq: Two times a day (BID) | ORAL | Status: DC

## 2011-01-03 NOTE — Assessment & Plan Note (Signed)
Has forms at home. Asked patient to go over with family and bring at next visit.

## 2011-01-03 NOTE — Progress Notes (Signed)
  Subjective:    Patient ID: Donald Moreno, male    DOB: May 12, 1941, 70 y.o.   MRN: [id number]  HPI 1. HTN Stopped amlodipine at last visit.  Taking torsemide and Coreg.   2. Eczema On abdomen and back of neck improving.  Now new rash on right leg. Has not tried anything for this but using steroid cream for other 2 places.   3. Snuff-user Chantix made him use more snuff.  Not interested in quitting.  Review of Systems Denies dizziness, chest pain, shortness-of-breath, blood in sputum or difficulty swallowing, blood in stool.     Objective:   Physical Exam General: NAD, pleasant HEENT: no teeth, mucosa pink with no lesions in oropharynx, no LAD CV: RRR, no m/g Pulm: CTAB, no w/r/r Ext: warm, 2+ pedal pulses bilaterally, no edema Skin: dry scaly rash on abdomen above belt buckle but rash not as extensive as before (can see areas that have improved), similar rash on neck and on lateral lower right leg    Assessment & Plan:

## 2011-01-03 NOTE — Assessment & Plan Note (Signed)
Stable. Has not needed albuterol. Continue current regimen.

## 2011-01-03 NOTE — Assessment & Plan Note (Signed)
Unchanged. Will stop Chantix as it has not helped. Patient not ready to quit but is aware of possible consequences of snuff use.

## 2011-01-03 NOTE — Assessment & Plan Note (Signed)
Controlled. No recent flares. Continue allopurinol daily and colchicine prn.

## 2011-01-03 NOTE — Patient Instructions (Signed)
Stop taking the torsemide.  For the rashes, use the cream and use a heavy thick lotion at least once daily.   Stretch before you exercise.   Bring the Advanced Directives forms at your next visit.  Follow-up in 3 months to re-check your blood pressure.

## 2011-01-03 NOTE — Assessment & Plan Note (Addendum)
Blood pressure continues to be low despite discontinuing Norvasc. Will discontinue torsemide today as patient shows no signs of fluid overload. Continue lisinopril and Coreg.  Follow-up in 3 months. Will not check labs this visit as they were fine last visit 3 months ago. Will check at next visit in 3 months.

## 2011-01-03 NOTE — Assessment & Plan Note (Signed)
Rash improving on abdomen and neck but new rash on right lower extremity. Continue steroid cream. Emphasized keeping areas well moisturized, using thick emollient at least once daily, especially after bathing.

## 2011-02-15 ENCOUNTER — Ambulatory Visit (INDEPENDENT_AMBULATORY_CARE_PROVIDER_SITE_OTHER): Payer: No Typology Code available for payment source | Admitting: *Deleted

## 2011-02-15 DIAGNOSIS — Z23 Encounter for immunization: Secondary | ICD-10-CM

## 2011-04-11 ENCOUNTER — Encounter: Payer: Self-pay | Admitting: Family Medicine

## 2011-04-11 ENCOUNTER — Ambulatory Visit (INDEPENDENT_AMBULATORY_CARE_PROVIDER_SITE_OTHER): Payer: No Typology Code available for payment source | Admitting: Family Medicine

## 2011-04-11 VITALS — BP 139/84 | HR 65 | Temp 98.2°F | Ht 71.0 in | Wt 180.0 lb

## 2011-04-11 DIAGNOSIS — J45909 Unspecified asthma, uncomplicated: Secondary | ICD-10-CM

## 2011-04-11 DIAGNOSIS — R05 Cough: Secondary | ICD-10-CM

## 2011-04-11 MED ORDER — AZITHROMYCIN 250 MG PO TABS: ORAL_TABLET | ORAL | Status: AC

## 2011-04-11 MED ORDER — ALBUTEROL 90 MCG/ACT IN AERS: 1.0000 | INHALATION_SPRAY | RESPIRATORY_TRACT | Status: DC | PRN

## 2011-04-11 MED ORDER — PREDNISONE 50 MG PO TABS: 50.0000 mg | ORAL_TABLET | Freq: Every day | ORAL | Status: DC

## 2011-04-11 NOTE — Patient Instructions (Signed)
Thank you for coming in today. Please take the prednisone daily for 5 day.  Take the Z-pack (azithromycin) 2 pills day 1 and 1 pill days 2-5.  Use the albuterol every 6 hours for today and then as needed after.  If you dont get better soon let me know.  If you are still horse in 2 weeks come back we will need x-rays.

## 2011-04-11 NOTE — Progress Notes (Signed)
   Donald Moreno presents to clinic today to discuss a cough and cold.   URI symptoms present now for 2 weeks. He notes a productive cough, runny nose and feeling run down. He denies any fevers chills or dyspnea. He also notes a sore throat.  Additionally he notes some hoarsness for 2 weeks. He feels well otherwise.   PMH reviewed.  ROS as above otherwise neg Medications reviewed. Current Outpatient Prescriptions  Medication Sig Dispense Refill  . albuterol (PROVENTIL,VENTOLIN) 90 MCG/ACT inhaler Inhale 1-2 puffs into the lungs every 4 (four) hours as needed for wheezing. Inhale 2 puff using inhaler every four hours  17 g  3  . allopurinol (ZYLOPRIM) 300 MG tablet Take 1 tablet (300 mg total) by mouth daily. To prevent gout attacks  30 tablet  3  . azithromycin (ZITHROMAX Z-PAK) 250 MG tablet Take 2 tablets (500 mg) on  Day 1,  followed by 1 tablet (250 mg) once daily on Days 2 through 5.  6 each  0  . carvedilol (COREG) 6.25 MG tablet Take 1 tablet (6.25 mg total) by mouth 2 (two) times daily.  180 tablet  3  . colchicine 0.6 MG tablet Take 2 tabs by mouth at the onset of a gout attack followed by one tablet one hour later. Continue daily until gout attack subsides.  30 tablet  1  . Fluticasone-Salmeterol (ADVAIR DISKUS) 500-50 MCG/DOSE AEPB Inhale 1 puff into the lungs 2 (two) times daily.  60 each  3  . hydrocortisone 1 % cream Apply to affected area (back of neck, belly, and leg) 2 times daily  60 g  2  . lisinopril (PRINIVIL,ZESTRIL) 40 MG tablet Take 1 tablet (40 mg total) by mouth daily.  30 tablet  3  . montelukast (SINGULAIR) 10 MG tablet Take 1 tablet (10 mg total) by mouth daily.  30 tablet  3  . naproxen (NAPROSYN) 500 MG tablet Take 500 mg by mouth 2 (two) times daily.        . predniSONE (DELTASONE) 50 MG tablet Take 1 tablet (50 mg total) by mouth daily.  5 tablet  0  . DISCONTD: albuterol (PROVENTIL,VENTOLIN) 90 MCG/ACT inhaler Inhale 1-2 puffs into the lungs every 4 (four) hours as  needed for wheezing. Inhale 2 puff using inhaler every four hours  17 g  3    Exam:  BP 139/84  Pulse 65  Temp(Src) 98.2 F (36.8 C) (Oral)  Ht 5\' 11"  (1.803 m)  Wt 180 lb (81.647 kg)  BMI 25.10 kg/m2 Gen: Well NAD HEENT: EOMI,  MMM Lungs: NL WOB, long exp phase but no wheeze. Clear otherwise.  Heart: RRR no MRG Abd: NABS, NT, ND Exts: Non edematous BL  LE, warm and well perfused.

## 2011-04-11 NOTE — Assessment & Plan Note (Signed)
Possibly this is a COPD exacerbation. He has a long history of asthma. I suspect that he does have some irreversible changes in his lungs.  Plan to treat as COPD excerb with Albuterol, Prednisone and Z-pack.  Will f/u in 2 weeks if not better. If horsiness still present then I would recommend Xray.

## 2011-05-19 ENCOUNTER — Other Ambulatory Visit: Payer: Self-pay | Admitting: Family Medicine

## 2011-05-19 DIAGNOSIS — I10 Essential (primary) hypertension: Secondary | ICD-10-CM

## 2011-05-19 DIAGNOSIS — J45909 Unspecified asthma, uncomplicated: Secondary | ICD-10-CM

## 2011-05-19 MED ORDER — MONTELUKAST SODIUM 10 MG PO TABS: 10.0000 mg | ORAL_TABLET | Freq: Every day | ORAL | Status: DC

## 2011-05-19 MED ORDER — LISINOPRIL 40 MG PO TABS: 40.0000 mg | ORAL_TABLET | Freq: Every day | ORAL | Status: DC

## 2011-06-28 ENCOUNTER — Encounter: Payer: Self-pay | Admitting: Home Health Services

## 2011-06-28 ENCOUNTER — Ambulatory Visit (INDEPENDENT_AMBULATORY_CARE_PROVIDER_SITE_OTHER): Payer: No Typology Code available for payment source | Admitting: Home Health Services

## 2011-06-28 VITALS — BP 144/75 | HR 73 | Temp 98.7°F | Ht 70.0 in | Wt 173.2 lb

## 2011-06-28 DIAGNOSIS — Z0000 Encounter for general adult medical examination without abnormal findings: Secondary | ICD-10-CM

## 2011-06-28 NOTE — Patient Instructions (Addendum)
1. Schedule eye appointment [facility] accepts Medicare [id number] [place], [place] [address] [phone]   2. Walk every day.  3. Think about quitting chew.

## 2011-06-28 NOTE — Progress Notes (Signed)
Patient here for annual wellness visit, patient reports: Risk Factors/Conditions needing evaluation or treatment: Pt does not have any risk factors that need evaluation. Home Safety: Pt lives with sister and nephew in 1 story home.  Pt reports having smoke detector and does not have adaptive equipment in bathroom. Other Information: Corrective lens: Pt does not wear corrective lens.  Pt has not gone to eye doctor in several years. Dentures: Pt has full dentures, does not wear them. Memory: Pt denies memory problems.  Patient's Mini Mental Score (recorded in doc. flowsheet): 23.  Pt report illiteracy therefore mini mental score may not accurately reflect cognitive impairment.   Balance/Gait: Pt does not have any noticeable impairment.  Balance Abnormal Patient value  Sitting balance    Sit to stand    Attempts to arise    Immediate standing balance    Standing balance    Nudge    Eyes closed- Romberg    Tandem stance    Back lean    Neck Rotation    360 degree turn    Sitting down     Gait Abnormal Patient value  Initiation of gait    Step length-left    Step length-right    Step height-left    Step height-right    Step symmetry    Step continuity    Path deviation    Trunk movement    Walking stance        Annual Wellness Visit Requirements Recorded Today In  Medical, family, social history Past Medical, Family, Social History Section  Current providers Care team  Current medications Medications  Wt, BP, Ht, BMI Vital signs  Tobacco, alcohol, illicit drug use History  ADL Nurse Assessment  Depression Screening Nurse Assessment  Cognitive impairment Nurse Assessment  Mini Mental Status Document Flowsheet  Fall Risk Nurse Assessment  Home Safety Progress Note  End of Life Planning (welcome visit) Social Documentation  Medicare preventative services Progress Note  Risk factors/conditions needing evaluation/treatment Progress Note  Personalized health advice  Patient Instructions, goals, letter  Diet & Exercise Social Documentation  Emergency Contact Social Documentation  Seat Belts Social Documentation  Sun exposure/protection Social Documentation    Medicare Prevention Plan:   Recommended Medicare Prevention Screenings Men over 65 Test For Frequency Date of Last- BOLD if needed  Colorectal Cancer 1-10 yrs 4/10  Prostate Cancer Never or yearly 12/09  Aortic Aneurysm Once if 65-75 with hx of smoking discussed  Cholesterol 5 yrs 5/11  Diabetes yearly 1/12  HIV yearly declined  Influenza Shot yearly 10/12  Pneumonia Shot once 12/09  Zostavax Shot once declined

## 2011-06-28 NOTE — Progress Notes (Signed)
  Subjective:    Patient ID: Donald Moreno, male    DOB: 07/14/1940, 71 y.o.   MRN: [id number]  HPI    Review of Systems     Objective:   Physical Exam        Assessment & Plan:  I have reviewed this visit and discussed with [name] and agree with her documentation.

## 2011-08-18 ENCOUNTER — Other Ambulatory Visit: Payer: Self-pay | Admitting: Family Medicine

## 2011-08-18 DIAGNOSIS — I10 Essential (primary) hypertension: Secondary | ICD-10-CM

## 2011-08-18 MED ORDER — NAPROXEN 500 MG PO TABS: 500.0000 mg | ORAL_TABLET | Freq: Two times a day (BID) | ORAL | Status: DC

## 2011-08-18 MED ORDER — LISINOPRIL 40 MG PO TABS: 40.0000 mg | ORAL_TABLET | Freq: Every day | ORAL | Status: DC

## 2011-09-19 ENCOUNTER — Other Ambulatory Visit: Payer: Self-pay | Admitting: Family Medicine

## 2011-10-29 ENCOUNTER — Other Ambulatory Visit: Payer: Self-pay | Admitting: Family Medicine

## 2011-11-11 ENCOUNTER — Encounter: Payer: Self-pay | Admitting: Family Medicine

## 2011-11-11 ENCOUNTER — Ambulatory Visit (INDEPENDENT_AMBULATORY_CARE_PROVIDER_SITE_OTHER): Payer: No Typology Code available for payment source | Admitting: Family Medicine

## 2011-11-11 VITALS — BP 154/72 | HR 58 | Temp 98.3°F | Ht 70.0 in | Wt 170.1 lb

## 2011-11-11 DIAGNOSIS — F17200 Nicotine dependence, unspecified, uncomplicated: Secondary | ICD-10-CM

## 2011-11-11 DIAGNOSIS — Z0000 Encounter for general adult medical examination without abnormal findings: Secondary | ICD-10-CM

## 2011-11-11 DIAGNOSIS — J45909 Unspecified asthma, uncomplicated: Secondary | ICD-10-CM

## 2011-11-11 DIAGNOSIS — I10 Essential (primary) hypertension: Secondary | ICD-10-CM

## 2011-11-11 LAB — LIPID PANEL
LDL Cholesterol: 44 mg/dL (ref 0–99)
VLDL: 26 mg/dL (ref 0–40)

## 2011-11-11 MED ORDER — NAPROXEN 500 MG PO TABS: 500.0000 mg | ORAL_TABLET | Freq: Two times a day (BID) | ORAL | Status: DC

## 2011-11-11 NOTE — Patient Instructions (Addendum)
If your lab results are normal, I will send you a letter with the results. If abnormal, someone at the clinic will get in touch with you.   Follow-up at the [facility] sometime in July. We can re-assess your blood pressure at that time as well.

## 2011-11-11 NOTE — Assessment & Plan Note (Signed)
Controlled.  He does have occasional wheeze today but no symptoms.  Continue current regimen.

## 2011-11-11 NOTE — Progress Notes (Signed)
  Subjective:    Patient ID: Suriel Calverley, male    DOB: Mar 26, 1941, 71 y.o.   MRN: [id number]  HPI Physical. No acute issues.   1. No thoughts about quitting chew tobacco. 2. Hypertension. Does not measure BP outside of clinic although he does have cuff at home. He reports compliance with medication, including this morning. 3. Asthma. No issues. He has not had to use albuterol recently.    Review of Systems Denies fevers/chills, feeling unwell Denies chest pain/palpitations, headaches, difficulty breathing Denies dysuria, problems with urination Denies feet swelling    Objective:   Physical Exam GEN: NAD; well-appearing, -nourished PSYCH: engaged, appropriate to questions, normal affect, pleasant, not depressed or anxious appearing HEENT:    Mouth: no teeth; no lesions in gum or oropharynx CV: RRR, no m/r/g PULM: NI WOB, occasional wheeze, fair aeration, no ronchi or rales ABD: soft, NT, ND GU: deferred Ext: no edema Skin: warm, dry    Assessment & Plan:

## 2011-11-11 NOTE — Assessment & Plan Note (Signed)
Not well controlled. His SBP has been ranging 110-150s in clinic. He is maximum dose of lisinopril. Would be hesitant to increase beta-blocker due to HR in 50s. So would need another agent.  Discussed options with patient. He will try to minimize salt in his diet for now and follow-up in about a month. Consider adding HCTZ is SBP consistently >150. Also strongly encouraged him to bring log of BP (since he has cuff at home) and check about once a week.

## 2011-11-11 NOTE — Assessment & Plan Note (Signed)
Not interested in quitting 

## 2011-11-14 ENCOUNTER — Encounter: Payer: Self-pay | Admitting: Family Medicine

## 2011-12-09 ENCOUNTER — Other Ambulatory Visit: Payer: Self-pay | Admitting: Family Medicine

## 2011-12-20 ENCOUNTER — Other Ambulatory Visit: Payer: Self-pay | Admitting: Family Medicine

## 2011-12-20 NOTE — Telephone Encounter (Signed)
Needs appointment

## 2011-12-24 ENCOUNTER — Other Ambulatory Visit: Payer: Self-pay | Admitting: Family Medicine

## 2012-01-13 ENCOUNTER — Other Ambulatory Visit: Payer: Self-pay | Admitting: Family Medicine

## 2012-01-13 NOTE — Telephone Encounter (Signed)
Needs appointment for more refills.

## 2012-01-26 ENCOUNTER — Other Ambulatory Visit: Payer: Self-pay | Admitting: Family Medicine

## 2012-02-16 ENCOUNTER — Other Ambulatory Visit: Payer: Self-pay | Admitting: Family Medicine

## 2012-02-22 ENCOUNTER — Ambulatory Visit: Payer: No Typology Code available for payment source | Admitting: Family Medicine

## 2012-03-07 ENCOUNTER — Encounter: Payer: Self-pay | Admitting: Family Medicine

## 2012-03-07 ENCOUNTER — Ambulatory Visit (INDEPENDENT_AMBULATORY_CARE_PROVIDER_SITE_OTHER): Payer: No Typology Code available for payment source | Admitting: Family Medicine

## 2012-03-07 VITALS — BP 141/82 | HR 56 | Temp 98.4°F | Ht 70.0 in | Wt 167.0 lb

## 2012-03-07 DIAGNOSIS — M109 Gout, unspecified: Secondary | ICD-10-CM

## 2012-03-07 DIAGNOSIS — M129 Arthropathy, unspecified: Secondary | ICD-10-CM

## 2012-03-07 DIAGNOSIS — Z23 Encounter for immunization: Secondary | ICD-10-CM

## 2012-03-07 DIAGNOSIS — Z0000 Encounter for general adult medical examination without abnormal findings: Secondary | ICD-10-CM

## 2012-03-07 DIAGNOSIS — I10 Essential (primary) hypertension: Secondary | ICD-10-CM

## 2012-03-07 DIAGNOSIS — F17200 Nicotine dependence, unspecified, uncomplicated: Secondary | ICD-10-CM

## 2012-03-07 MED ORDER — LISINOPRIL 40 MG PO TABS: 40.0000 mg | ORAL_TABLET | Freq: Every day | ORAL | Status: DC

## 2012-03-07 MED ORDER — ALLOPURINOL 300 MG PO TABS: 300.0000 mg | ORAL_TABLET | Freq: Every day | ORAL | Status: DC

## 2012-03-07 MED ORDER — CARVEDILOL 6.25 MG PO TABS: 6.2500 mg | ORAL_TABLET | Freq: Two times a day (BID) | ORAL | Status: DC

## 2012-03-07 MED ORDER — FLUTICASONE-SALMETEROL 500-50 MCG/DOSE IN AEPB: 1.0000 | INHALATION_SPRAY | Freq: Two times a day (BID) | RESPIRATORY_TRACT | Status: DC

## 2012-03-07 MED ORDER — MONTELUKAST SODIUM 10 MG PO TABS: 10.0000 mg | ORAL_TABLET | Freq: Every day | ORAL | Status: DC

## 2012-03-07 NOTE — Assessment & Plan Note (Signed)
Flu shot today 

## 2012-03-07 NOTE — Assessment & Plan Note (Signed)
Controlled with occasional NSAID

## 2012-03-07 NOTE — Patient Instructions (Addendum)
Re-start lisinopril (sent to your pharmacy)  Continue current medications  Start baby aspirin (81 mg chewable) daily  Schedule a follow-up to do a biopsy of your leg rash  It was nice to see you today.

## 2012-03-07 NOTE — Assessment & Plan Note (Signed)
Controlled with current medications.

## 2012-03-07 NOTE — Assessment & Plan Note (Signed)
He is not at all interested in quitting. He is aware of risks.

## 2012-03-07 NOTE — Progress Notes (Signed)
  Subjective:    Patient ID: Donald Moreno, male    DOB: [birth year], 71 y.o.   MRN: [id number]  HPI # Preventative He would like flu shot today He is not interested in quitting chewing tobacco  # Arthritis  Well controlled with prn NSAID (does not use every day; maybe a few times a week if that)  # Gout He had a flare that lasted for 2 days a few weeks ago It resolved with colchicine/NSAID He is taking allopurinol daily   # HTN He stopped taking lisinopril recently because he ran out and new Rx was not filled (I had advised that he make a follow-up visit before any more refills and sent in 30 tablets at that time) ROS: denies chest pain, headache; he does have pain around his right eye sometimes and is scheduled to see eye doctor; he sometimes have blurred vision in the morning; he denies headache or eye pain currently   Review of Systems Per HPI  Allergies, medication, past medical history reviewed.      Objective:   Physical Exam GEN: NAD; well-nourished, -appearing PSYCH: pleasant CV: RRR, no m/r/g, normal S1/S2 PULM: NI WOB; CTAB without w/r/r EXT: no edema     Assessment & Plan:

## 2012-03-07 NOTE — Assessment & Plan Note (Signed)
It could be better controlled. He was asked to resume lisinopril and new Rx sent in and to continue other anti-hypertensives.

## 2012-03-15 ENCOUNTER — Ambulatory Visit (INDEPENDENT_AMBULATORY_CARE_PROVIDER_SITE_OTHER): Payer: No Typology Code available for payment source | Admitting: Family Medicine

## 2012-03-15 ENCOUNTER — Encounter: Payer: Self-pay | Admitting: Family Medicine

## 2012-03-15 VITALS — BP 172/75 | HR 69 | Temp 98.5°F | Ht 70.0 in | Wt 163.0 lb

## 2012-03-15 DIAGNOSIS — L2089 Other atopic dermatitis: Secondary | ICD-10-CM

## 2012-03-15 DIAGNOSIS — L989 Disorder of the skin and subcutaneous tissue, unspecified: Secondary | ICD-10-CM

## 2012-03-15 NOTE — Patient Instructions (Addendum)
For you wound: -Change the dressing every day. Wash gently with mild soap and water. Use antibiotic ointment. Cover for gauze or a band-aide.   If you have fevers or notice redness and worsening pain in that area, please call the clinic and let me know.   Follow-up next Friday 11/08.

## 2012-03-16 NOTE — Assessment & Plan Note (Signed)
Biopsies in center of rash and at border taken for evaluation due to persistence of rash and no improvement with steroid cream.

## 2012-03-16 NOTE — Progress Notes (Signed)
  Subjective:    Patient ID: Donald Moreno, male    DOB: May 14, 1941, 71 y.o.   MRN: [id number]  HPI # Unchanged rash on right lower extremity It has been present for several months. I noted it on 12/2010 office visit. He has a history of what seems to be eczema and has had similar rashes in various areas of his body before that have improved with steroid creams and emollients in the past.  This current rash though is not getting worse but is not improving with the creams. He uses 3 different kinds that he seems to alternate. He is not sure what he is using right now. He applies after his bath.  Review of Systems Denies new rashes    Objective:   Physical Exam GEN: NAD SKIN: 10 x 5 cm dry, scaly, hyperpigmented, slightly rash lesion on right lower leg with clean borders  Procedure note: skin tag removal  Written informed consent received. Right lower inner shin cleaned with Betadine x 2 and alcohol swabs x 4, then injected with a total of 3 cm 2% lidocaine with epinephrine.  Good anesthesia achieved.  5 mm punch biopsy taken at border and in middle of lesion Hemostasis achieved promptly with silver nitrate Area covered with antibiotic ointment and then gauze with tape Patient was advised to keep area clean and dry and change dressing daily for the next few days to prevent infection.     Assessment & Plan:

## 2012-03-23 ENCOUNTER — Ambulatory Visit (INDEPENDENT_AMBULATORY_CARE_PROVIDER_SITE_OTHER): Payer: No Typology Code available for payment source | Admitting: Family Medicine

## 2012-03-23 ENCOUNTER — Encounter: Payer: Self-pay | Admitting: Family Medicine

## 2012-03-23 VITALS — BP 133/74 | HR 67 | Temp 98.2°F | Ht 70.0 in | Wt 171.0 lb

## 2012-03-23 DIAGNOSIS — L2089 Other atopic dermatitis: Secondary | ICD-10-CM

## 2012-03-23 MED ORDER — TRIAMCINOLONE ACETONIDE 0.1 % EX CREA: TOPICAL_CREAM | Freq: Two times a day (BID) | CUTANEOUS | Status: DC

## 2012-03-23 NOTE — Progress Notes (Signed)
  Subjective:    Patient ID: Donald Moreno, male    DOB: 1940/08/07, 71 y.o.   MRN: [id number]  HPI # Dermatitis Punch biopsy wound taken last week as some clear drainage He brought cream he had been using with him today. Vaseline, hydrating creams, diaper rash ointment, baby oil and lotion.  He denies fevers, tenderness/redness/warmth in leg  Review of Systems Per HPI  Allergies, medication, past medical history reviewed.     Objective:   Physical Exam GEN: NAD SKIN: 12mm x 2 punch biopsies with minimal serous drainage; large thickened hypertrophic dry scaly rash on lateral lower right leg    Assessment & Plan:

## 2012-03-23 NOTE — Assessment & Plan Note (Signed)
We discussed biopsy results consistent with atopic dermatitis. We will treat more aggressively with bid TAC 0.1% cream and Vaseline particularly after bathing. Follow-up in 1 month if not improved.

## 2012-03-23 NOTE — Patient Instructions (Addendum)
What to do for your rash: Put antibiotic on your wound Then put prescription cream (triamcinolone cream) over the rash Then cover everything with vaseline Then cover with your sock   Do this for a month or until rash goes away  Follow-up in 1 month if the rash is not improving Otherwise, follow-up in 6 months

## 2012-04-16 ENCOUNTER — Other Ambulatory Visit: Payer: Self-pay | Admitting: Family Medicine

## 2012-06-04 ENCOUNTER — Other Ambulatory Visit: Payer: Self-pay | Admitting: *Deleted

## 2012-06-04 MED ORDER — COLCHICINE 0.6 MG PO TABS: ORAL_TABLET | ORAL | Status: DC

## 2012-07-13 ENCOUNTER — Other Ambulatory Visit: Payer: Self-pay | Admitting: Family Medicine

## 2012-07-23 ENCOUNTER — Encounter: Payer: Self-pay | Admitting: Home Health Services

## 2012-07-31 ENCOUNTER — Ambulatory Visit (INDEPENDENT_AMBULATORY_CARE_PROVIDER_SITE_OTHER): Payer: No Typology Code available for payment source | Admitting: Home Health Services

## 2012-07-31 ENCOUNTER — Encounter: Payer: Self-pay | Admitting: Home Health Services

## 2012-07-31 VITALS — BP 148/78 | HR 52 | Temp 97.6°F | Ht 70.0 in | Wt 169.0 lb

## 2012-07-31 DIAGNOSIS — Z0000 Encounter for general adult medical examination without abnormal findings: Secondary | ICD-10-CM

## 2012-07-31 NOTE — Patient Instructions (Addendum)
1. Schedule eye appointment with Dr. Katy Fitch. 2. Continue to walk every day. 3. Eat fruits and vegetables every day. 4. Take all medications as prescribed. 5. Consider quitting chewing tobacco. 6. No more than 2 beers a day.

## 2012-07-31 NOTE — Progress Notes (Signed)
Patient here for annual wellness visit, patient reports: Risk Factors/Conditions needing evaluation or treatment: Pt has not risk factors that need evaluation. Home Safety: Pt lives with sister and nephew in 1 story home. Other Information: Corrective lens: Pt does not have corrective lens and has not had eye appointment in several years. Recommenced scheduling eye apt ASAP.  Dentures: Pt has full dentures but does not wear them. Memory: Pt denies memory loss.  Patient's Mini Mental Score (recorded in doc. flowsheet): 22 pt has low literacy and score may not acuratley reflect cognitive ability.  Balance/Gait: Pt has no noticeable impairment.   Balance Abnormal Patient value  Sitting balance    Sit to stand    Attempts to arise    Immediate standing balance    Standing balance    Nudge    Eyes closed- Romberg    Tandem stance    Back lean    Neck Rotation    360 degree turn    Sitting down     Gait Abnormal Patient value  Initiation of gait    Step length-left    Step length-right    Step height-left    Step height-right    Step symmetry    Step continuity    Path deviation    Trunk movement    Walking stance        Annual Wellness Visit Requirements Recorded Today In  Medical, family, social history Past Medical, Family, Social History Section  Current providers Care team  Current medications Medications  Wt, BP, Ht, BMI Vital signs  Tobacco, alcohol, illicit drug use History  ADL Nurse Assessment  Depression Screening Nurse Assessment  Cognitive impairment Nurse Assessment  Mini Mental Status Document Flowsheet  Fall Risk Nurse Assessment  Home Safety Progress Note  End of Life Planning (welcome visit) Social Documentation  Medicare preventative services Progress Note  Risk factors/conditions needing evaluation/treatment Progress Note  Personalized health advice Patient Instructions, goals, letter  Diet & Exercise Social Documentation  Emergency Contact  Social Documentation  Seat Belts Social Documentation  Sun exposure/protection Social Documentation

## 2012-07-31 NOTE — Progress Notes (Signed)
I have reviewed this visit and discussed with Suzanne Lineberry and agree with her documentation  

## 2012-08-06 ENCOUNTER — Ambulatory Visit (INDEPENDENT_AMBULATORY_CARE_PROVIDER_SITE_OTHER): Payer: No Typology Code available for payment source | Admitting: Family Medicine

## 2012-08-06 DIAGNOSIS — J45909 Unspecified asthma, uncomplicated: Secondary | ICD-10-CM

## 2012-08-06 DIAGNOSIS — F17200 Nicotine dependence, unspecified, uncomplicated: Secondary | ICD-10-CM

## 2012-08-06 DIAGNOSIS — I10 Essential (primary) hypertension: Secondary | ICD-10-CM

## 2012-08-06 MED ORDER — CARVEDILOL 6.25 MG PO TABS: 6.2500 mg | ORAL_TABLET | Freq: Two times a day (BID) | ORAL | Status: DC

## 2012-08-06 MED ORDER — ALBUTEROL SULFATE HFA 108 (90 BASE) MCG/ACT IN AERS: 2.0000 | INHALATION_SPRAY | Freq: Four times a day (QID) | RESPIRATORY_TRACT | Status: DC | PRN

## 2012-08-06 MED ORDER — ALLOPURINOL 300 MG PO TABS: 300.0000 mg | ORAL_TABLET | Freq: Every day | ORAL | Status: DC

## 2012-08-06 NOTE — Assessment & Plan Note (Signed)
Poorly controlled today, asymptomatic. He did not take his medications this morning in anticipation of today's OV. I advised he take medications even if coming to see provider. He will bring his sister to her appointment in a few days and I will re-check at that time. I anticipate that it will improved.

## 2012-08-06 NOTE — Patient Instructions (Signed)
We will re-check your blood pressure on Friday  Follow-up in June

## 2012-08-06 NOTE — Progress Notes (Signed)
  Subjective:    Patient ID: Donald Moreno, male    DOB: October 21, 1940, 72 y.o.   MRN: [id number]  HPI # Hypertension  He does not check at home.  He says his blood pressure fluctuates, high, normal, low.  He is usually compliant with medications, does not miss doses but did not take this morning because of appointment today.  ROS: denies headaches, chest pain, difficulty breathing  # Asthma, persistent Compliant with Advair Uses albuterol: infrequently; he has not had to use it in a while   # Tobacco  Continues to use chewing tobacco    Review of Systems Per HPI  Allergies, medication, past medical history reviewed.  Smoking status noted.     Objective:   Physical Exam GEN: NAD; well-nourished, -appearing PSYCH: pleasant; alert and oriented CV: RRR, no m/r/g PULM: NI WOB; CTAB EXT: no edema    Assessment & Plan:

## 2012-08-06 NOTE — Assessment & Plan Note (Signed)
He is not interested in quitting chewing tobacco.

## 2012-08-06 NOTE — Assessment & Plan Note (Signed)
Controlled. He had been using fluticasone inhaler prn. I took this medication away from him today and recommended he use albuterol prn; Rx sent. Continue Advair, which he is using appropriately.

## 2012-11-07 ENCOUNTER — Ambulatory Visit (INDEPENDENT_AMBULATORY_CARE_PROVIDER_SITE_OTHER): Payer: No Typology Code available for payment source | Admitting: Family Medicine

## 2012-11-07 VITALS — BP 149/79 | HR 64 | Temp 98.4°F | Wt 163.4 lb

## 2012-11-07 DIAGNOSIS — M109 Gout, unspecified: Secondary | ICD-10-CM

## 2012-11-07 DIAGNOSIS — I10 Essential (primary) hypertension: Secondary | ICD-10-CM

## 2012-11-07 DIAGNOSIS — J45909 Unspecified asthma, uncomplicated: Secondary | ICD-10-CM

## 2012-11-07 MED ORDER — MONTELUKAST SODIUM 10 MG PO TABS: 10.0000 mg | ORAL_TABLET | Freq: Every day | ORAL | Status: DC

## 2012-11-07 MED ORDER — CARVEDILOL 6.25 MG PO TABS: 6.2500 mg | ORAL_TABLET | Freq: Two times a day (BID) | ORAL | Status: DC

## 2012-11-07 MED ORDER — ALLOPURINOL 300 MG PO TABS: 300.0000 mg | ORAL_TABLET | Freq: Every day | ORAL | Status: DC

## 2012-11-07 MED ORDER — FLUTICASONE-SALMETEROL 500-50 MCG/DOSE IN AEPB: 1.0000 | INHALATION_SPRAY | Freq: Two times a day (BID) | RESPIRATORY_TRACT | Status: DC

## 2012-11-07 MED ORDER — COLCHICINE 0.6 MG PO TABS: ORAL_TABLET | ORAL | Status: DC

## 2012-11-07 NOTE — Assessment & Plan Note (Signed)
Controlled on Advair, Singulair, prn albuterol which he uses infrequently.

## 2012-11-07 NOTE — Patient Instructions (Addendum)
Fllow-up in 6 months or sooner if needed

## 2012-11-07 NOTE — Assessment & Plan Note (Signed)
Controlled on current medications 

## 2012-11-07 NOTE — Assessment & Plan Note (Signed)
Controlled. No recent exacerbations on prophylactic medication.

## 2012-11-07 NOTE — Progress Notes (Signed)
  Subjective:    Patient ID: Donald Moreno, male    DOB: [birth year], 72 y.o.   MRN: [id number]  HPI # Asthma Albuterol: rarely uses Advair: bid  # Gout on PPx medication He has not had one recently    # HTN See below ROS Denies headache  Review of Systems Per HPI Denies chest pain, difficulty breathing, constipation, urinating problems Denies depression   Allergies, medication, past medical history reviewed.  Smoking status noted. Stills uses snuff Alcohol 1-2 beer daily     Objective:   Physical Exam GEN: NAD HEENT:   Head: [place]/AT   Eyes: normal conjunctiva without injection or tearing   Mouth: MMM; odentulous; no oral lesions NECK: no LAD CV: RRR, no m/r/g PULM: NI WOB; CTAB without w/r/r     Assessment & Plan:

## 2012-11-11 ENCOUNTER — Other Ambulatory Visit: Payer: Self-pay | Admitting: Family Medicine

## 2013-01-22 ENCOUNTER — Telehealth: Payer: Self-pay | Admitting: Family Medicine

## 2013-01-22 NOTE — Telephone Encounter (Signed)
Pt would like a refill on his arthritis medicine he thinks its the allopurinol medicine.

## 2013-01-23 ENCOUNTER — Other Ambulatory Visit: Payer: Self-pay | Admitting: Family Medicine

## 2013-01-23 MED ORDER — ALLOPURINOL 300 MG PO TABS: 300.0000 mg | ORAL_TABLET | Freq: Every day | ORAL | Status: DC

## 2013-01-23 NOTE — Telephone Encounter (Signed)
Pt informed. Fleeger, Jessica Dawn  

## 2013-01-23 NOTE — Telephone Encounter (Signed)
Refill sent. Raeann Offner M. Eileene Kisling, M.D.  

## 2013-01-30 ENCOUNTER — Ambulatory Visit (INDEPENDENT_AMBULATORY_CARE_PROVIDER_SITE_OTHER): Payer: No Typology Code available for payment source | Admitting: Family Medicine

## 2013-01-30 ENCOUNTER — Encounter: Payer: Self-pay | Admitting: Family Medicine

## 2013-01-30 VITALS — BP 162/81 | HR 54 | Temp 98.6°F | Wt 169.0 lb

## 2013-01-30 DIAGNOSIS — I10 Essential (primary) hypertension: Secondary | ICD-10-CM

## 2013-01-30 DIAGNOSIS — M129 Arthropathy, unspecified: Secondary | ICD-10-CM

## 2013-01-30 DIAGNOSIS — Z23 Encounter for immunization: Secondary | ICD-10-CM

## 2013-01-30 DIAGNOSIS — Z0000 Encounter for general adult medical examination without abnormal findings: Secondary | ICD-10-CM

## 2013-01-30 DIAGNOSIS — M109 Gout, unspecified: Secondary | ICD-10-CM

## 2013-01-30 LAB — COMPREHENSIVE METABOLIC PANEL
Alkaline Phosphatase: 124 U/L — ABNORMAL HIGH (ref 39–117)
BUN: 14 mg/dL (ref 6–23)
Glucose, Bld: 97 mg/dL (ref 70–99)
Sodium: 130 mEq/L — ABNORMAL LOW (ref 135–145)
Total Bilirubin: 0.5 mg/dL (ref 0.3–1.2)

## 2013-01-30 LAB — CBC
HCT: 40.5 % (ref 39.0–52.0)
Hemoglobin: 13.9 g/dL (ref 13.0–17.0)
MCH: 34.4 pg — ABNORMAL HIGH (ref 26.0–34.0)
MCV: 100.2 fL — ABNORMAL HIGH (ref 78.0–100.0)
RBC: 4.04 MIL/uL — ABNORMAL LOW (ref 4.22–5.81)

## 2013-01-30 MED ORDER — MELOXICAM 15 MG PO TABS: 15.0000 mg | ORAL_TABLET | Freq: Every day | ORAL | Status: DC | PRN

## 2013-01-30 NOTE — Assessment & Plan Note (Signed)
Flu shot and prevnar today.

## 2013-01-30 NOTE — Assessment & Plan Note (Signed)
No change. Refills previously sent. Contact our office if he has a severe flare.

## 2013-01-30 NOTE — Progress Notes (Signed)
Patient ID: Donald Moreno, male   DOB: 04-29-1941, 72 y.o.   MRN: [id number]  [facility] Brandii Lakey M. Shekita Boyden, MD Phone: [phone]   Subjective: HPI: Patient is a 72 y.o. male presenting to clinic today for follow up appointment. Concerns today include needing arthritis medication (unsure which medication this is)  1. Gout - No recent flares. On allopurinol daily with colcrys prn. States he knows when his attack is coming based on tingling in his toes 2. Arthritis - Needs med refilled. Based on previous notes, it looks like he was on Mobic prn in the past. I googled a picture of the pill and he immediately identified the Mobic 15mg . He states his arthritis is all over and he only takes the NSAID when it is very severe. No acute concerns today 3. Health Maintenance- Pt UTD on pneumovax and zoster. Needs Prevnar booster and flu shot today. No labs since 2012, will check today given chronic medication use.  History Reviewed:  Oral tobacco user  ROS: Please see HPI above.  Objective: Office vital signs reviewed. BP 162/81  Pulse 54  Temp(Src) 98.6 F (37 C) (Oral)  Wt 169 lb (76.658 kg)  BMI 24.25 kg/m2  Physical Examination:  General: Awake, alert. NAD HEENT: Atraumatic, normocephalic Neck: No masses palpated. No LAD Pulm: CTAB, scant wheezes at bases Cardio: RRR, no murmurs appreciated Abdomen:+BS, soft, nontender, nondistended Extremities: No edema Neuro: Grossly intact  Assessment: 72 y.o. male follow up  Plan: See Problem List and After Visit Summary

## 2013-01-30 NOTE — Patient Instructions (Addendum)
It was nice to meet you today!  I have sent Meloxicam to the pharmacy for you to use as needed for your arthritis pain.  If your labs are not normal, I will call you. Otherwise, please come back in 6 months or sooner if you need anything!  Amber M. Hairford, M.D.

## 2013-01-30 NOTE — Assessment & Plan Note (Signed)
Bmet and CBC today. Con't current medications.

## 2013-01-30 NOTE — Assessment & Plan Note (Signed)
Mobic 15mg  prn pain sent to pharmacy. Hopefully this will help. F/u if it does not.

## 2013-03-22 ENCOUNTER — Other Ambulatory Visit: Payer: Self-pay | Admitting: Family Medicine

## 2013-05-03 ENCOUNTER — Ambulatory Visit: Payer: No Typology Code available for payment source | Admitting: Family Medicine

## 2013-05-16 DIAGNOSIS — K7460 Unspecified cirrhosis of liver: Secondary | ICD-10-CM

## 2013-05-16 HISTORY — DX: Unspecified cirrhosis of liver: K74.60

## 2013-05-27 ENCOUNTER — Ambulatory Visit: Payer: No Typology Code available for payment source | Admitting: Family Medicine

## 2013-06-22 ENCOUNTER — Other Ambulatory Visit: Payer: Self-pay | Admitting: Family Medicine

## 2013-07-05 ENCOUNTER — Ambulatory Visit (INDEPENDENT_AMBULATORY_CARE_PROVIDER_SITE_OTHER): Payer: Medicare Other | Admitting: Family Medicine

## 2013-07-05 ENCOUNTER — Ambulatory Visit (HOSPITAL_COMMUNITY)
Admission: RE | Admit: 2013-07-05 | Discharge: 2013-07-05 | Disposition: A | Discharge status: Home / Self Care | Payer: Medicare Other | Source: Ambulatory Visit | Attending: Sports Medicine | Admitting: Sports Medicine

## 2013-07-05 ENCOUNTER — Encounter: Payer: Self-pay | Admitting: Family Medicine

## 2013-07-05 VITALS — BP 161/81 | HR 57 | Temp 98.9°F | Ht 70.0 in | Wt 166.0 lb

## 2013-07-05 DIAGNOSIS — M109 Gout, unspecified: Secondary | ICD-10-CM

## 2013-07-05 DIAGNOSIS — Z0000 Encounter for general adult medical examination without abnormal findings: Secondary | ICD-10-CM

## 2013-07-05 DIAGNOSIS — R001 Bradycardia, unspecified: Secondary | ICD-10-CM

## 2013-07-05 DIAGNOSIS — J45909 Unspecified asthma, uncomplicated: Secondary | ICD-10-CM

## 2013-07-05 DIAGNOSIS — I498 Other specified cardiac arrhythmias: Secondary | ICD-10-CM

## 2013-07-05 DIAGNOSIS — R9431 Abnormal electrocardiogram [ECG] [EKG]: Secondary | ICD-10-CM | POA: Insufficient documentation

## 2013-07-05 DIAGNOSIS — I10 Essential (primary) hypertension: Secondary | ICD-10-CM

## 2013-07-05 MED ORDER — ALBUTEROL SULFATE HFA 108 (90 BASE) MCG/ACT IN AERS: INHALATION_SPRAY | RESPIRATORY_TRACT | Status: DC

## 2013-07-05 MED ORDER — CARVEDILOL 6.25 MG PO TABS
6.2500 mg | ORAL_TABLET | Freq: Two times a day (BID) | ORAL | Status: DC
Start: 2013-07-05 — End: 2013-07-05

## 2013-07-05 NOTE — Patient Instructions (Signed)
I am glad everything is going well for you.  Stop taking the Carvedilol. I will call your pharmacy and tell them as well.  I would like to see you back in 2 weeks to recheck your blood pressure off this medication.  If you have any chest pain, shortness of breath or feel like you are going to pass out, please seek medical care.  Ivana Nicastro M. Jasani Lengel, M.D.

## 2013-07-08 DIAGNOSIS — R001 Bradycardia, unspecified: Secondary | ICD-10-CM | POA: Insufficient documentation

## 2013-07-08 NOTE — Assessment & Plan Note (Signed)
Stable without recent flares. Con't regimen.

## 2013-07-08 NOTE — Progress Notes (Signed)
Patient ID: Donald Moreno, male   DOB: [birth year], 73 y.o.   MRN: [id number]    Subjective: HPI: Patient is a 73 y.o. male presenting to clinic today for annual exam. He has no concerns today, and states he feels well  1. Hypertension Blood pressure at home: Does not check Blood pressure today: 161/81 Pulse: 57 Taking Meds: Yes, Coreg and Lisinopril Side effects: None. He states he does not have CP, SOB or pre-syncope especially with position changes ROS: Denies headache, visual changes, nausea, vomiting, chest pain, abdominal pain or shortness of breath. *He has never seen a cardiologist. His last EKG was many years ago (not in [place])  2. Asthma Stable, no recent flares. Taking Advair daily, singulair po daily and Albuterol prn. He has been intubated in the past for his asthma. He has excellent control at this time with no concerns. He does not smoke. No recent respiratory illnesses.   3. Gout No recent flares. He has allopurinol and colchicine to use, which he is tolerating with no adverse side effects. Take Mobic prn for pain   History Reviewed: Dips tobacco daily. No interest in quitting  Health Maintenance:  UTD colonoscopy, but states he will not have another.  Declines DRE today (also states he has no problems urinating).  UTD on flu, pneumovax and prevar.  ROS: Please see HPI above.  Objective: Office vital signs reviewed. BP 161/81  Pulse 57  Temp(Src) 98.9 F (37.2 C) (Oral)  Ht 5\' 10"  (1.778 m)  Wt 166 lb (75.297 kg)  BMI 23.82 kg/m2  Physical Examination:  General: Awake, alert. NAD. Pleasant HEENT: Atraumatic, normocephalic. Mild posterior pharynx erythema. Neck: No masses palpated. No LAD Pulm: CTAB, no wheezes Cardio: RRR, no murmurs appreciated Abdomen:+BS, soft, nontender, nondistended Extremities: No edema Skin: Diffusely dry skin. Large hyperpigmented area on RLE with excoriations Neuro: Grossly intact  Assessment: 73 y.o. male annual  exam  Plan: See Problem List and After Visit Summary

## 2013-07-08 NOTE — Assessment & Plan Note (Signed)
BP at goal for age, however now that we are stopping Coreg for bradycardia will need to keep close eye on BP. F/u in 2 weeks.

## 2013-07-08 NOTE — Assessment & Plan Note (Signed)
Clinc EKG obtained and reviewed. Sinus brady without BBB. Shows some upsloping of T waves in lateral leads and inverted T waves. Since there is no old one for comparison, will monitor at this time. Pt advised to STOP taking Coreg and f/u in 2 weeks for bp check and possible repeat EKG. Given red flags that should prompt immediate evaluation such as CP, SOB, syncope or pre-syncope.

## 2013-07-08 NOTE — Assessment & Plan Note (Signed)
Stable. Con't current regimen. Advised to avoid sick contacts. Refills on Albuterol sent to pharmacy.

## 2013-07-19 ENCOUNTER — Ambulatory Visit (INDEPENDENT_AMBULATORY_CARE_PROVIDER_SITE_OTHER): Payer: Medicare Other | Admitting: Family Medicine

## 2013-07-19 ENCOUNTER — Encounter: Payer: Self-pay | Admitting: Family Medicine

## 2013-07-19 VITALS — BP 134/80 | HR 60 | Temp 98.3°F | Wt 164.0 lb

## 2013-07-19 DIAGNOSIS — R001 Bradycardia, unspecified: Secondary | ICD-10-CM

## 2013-07-19 DIAGNOSIS — I498 Other specified cardiac arrhythmias: Secondary | ICD-10-CM

## 2013-07-19 NOTE — Patient Instructions (Signed)
Stay off your Coreg. Let me know if anything changes.  I will see you back in June. Call to make your appointment.  Jacorie Ernsberger M. Morayo Leven, M.D.

## 2013-07-19 NOTE — Assessment & Plan Note (Signed)
Improved off beta blocker, no symptoms. Will continue current regimen and f/u in 3 months or sooner if needed. Will likely need repeat EKG.

## 2013-07-19 NOTE — Progress Notes (Signed)
Patient ID: Donald Moreno, male   DOB: March 03, 1941, 73 y.o.   MRN: [id number]    Subjective: HPI: Patient is a 73 y.o. male presenting to clinic today for asymptomatic bradycardia.  He was seen in the clinic 2 weeks ago and noted to have bradycardia. He was not having any symptoms at that time, but was on a Beta blocker. His EKG had nonspecific changes and did not show a bundle branch block. We stopped his BB and brought him back today to check his BP and how he is feeling. He states he feels fine and still has not had any symptoms at all. His BP is stable. He is happy to not take an extra medication.   History Reviewed: Oral tobacco user Health Maintenance: UTD  ROS: Please see HPI above. Denies CP, headaches, SOB, syncope  Objective: Office vital signs reviewed. BP 134/80  Pulse 60  Temp(Src) 98.3 F (36.8 C) (Oral)  Wt 164 lb (74.39 kg)  Physical Examination:  General: Awake, alert. NAD HEENT: Atraumatic, normocephalic. MMM Neck: No masses palpated. No LAD Pulm: CTAB, no wheezes Cardio: RRR, no murmurs appreciated Abdomen:+BS, soft, nontender, nondistended Extremities: Trace edema Neuro: Grossly intact  Assessment: 73 y.o. male with bradycardia  Plan: See Problem List and After Visit Summary

## 2013-09-09 ENCOUNTER — Encounter: Payer: Self-pay | Admitting: Internal Medicine

## 2013-11-23 ENCOUNTER — Other Ambulatory Visit: Payer: Self-pay | Admitting: Family Medicine

## 2014-01-23 ENCOUNTER — Other Ambulatory Visit: Payer: Self-pay | Admitting: Family Medicine

## 2014-01-23 MED ORDER — ALLOPURINOL 300 MG PO TABS
300.0000 mg | ORAL_TABLET | Freq: Every day | ORAL | Status: DC
Start: 2014-01-23 — End: 2014-07-30

## 2014-01-27 ENCOUNTER — Encounter: Payer: Self-pay | Admitting: Obstetrics and Gynecology

## 2014-01-27 ENCOUNTER — Ambulatory Visit (INDEPENDENT_AMBULATORY_CARE_PROVIDER_SITE_OTHER): Payer: Medicare Other | Admitting: Obstetrics and Gynecology

## 2014-01-27 VITALS — BP 145/63 | HR 64 | Temp 98.5°F | Ht 70.0 in | Wt 159.2 lb

## 2014-01-27 DIAGNOSIS — Z23 Encounter for immunization: Secondary | ICD-10-CM

## 2014-01-27 DIAGNOSIS — M109 Gout, unspecified: Secondary | ICD-10-CM

## 2014-01-27 DIAGNOSIS — Z0000 Encounter for general adult medical examination without abnormal findings: Secondary | ICD-10-CM

## 2014-01-27 DIAGNOSIS — J45909 Unspecified asthma, uncomplicated: Secondary | ICD-10-CM

## 2014-01-27 DIAGNOSIS — F17200 Nicotine dependence, unspecified, uncomplicated: Secondary | ICD-10-CM

## 2014-01-27 DIAGNOSIS — I10 Essential (primary) hypertension: Secondary | ICD-10-CM

## 2014-01-27 LAB — BASIC METABOLIC PANEL
BUN: 13 mg/dL (ref 6–23)
CHLORIDE: 103 meq/L (ref 96–112)
CO2: 25 mEq/L (ref 19–32)
CREATININE: 1.07 mg/dL (ref 0.50–1.35)
Calcium: 8.9 mg/dL (ref 8.4–10.5)
GLUCOSE: 115 mg/dL — AB (ref 70–99)
Potassium: 4.4 mEq/L (ref 3.5–5.3)
Sodium: 135 mEq/L (ref 135–145)

## 2014-01-27 NOTE — Assessment & Plan Note (Addendum)
Blood pressure at goal for age. Patient should continue with using Lisinopril. BMET ordered.

## 2014-01-27 NOTE — Assessment & Plan Note (Signed)
Patient stable. Continue current regimen. Use albuterol as needed.

## 2014-01-27 NOTE — Progress Notes (Signed)
Patient ID: Donald Moreno, male   DOB: 1940-06-14, 73 y.o.   MRN: [id number]     Subjective:  Chief Complaint  Patient presents with  . Annual Exam    HPI: Donald Moreno is a 73 y.o. presenting to clinic today for his annual visit. He has no concerns.  #Hypertension Blood pressure at home: Patient does not track blood pressures at home. Blood pressure today: 145/63 Taking Meds: Yes lisinopril Side effects: None ROS: Denies headache, dizziness, visual changes, nausea, vomiting, chest pain, abdominal pain or shortness of breath.  #Asthma  Stable, no recent flares. Taking Advair daily, Singulair daily, and Albuterol prn. He has been intubated/hospitalized in the past for his asthma. He has excellent control at this time with no concerns. He does not smoke. No recent respiratory illnesses.   #Gout  No recent flares. He has allopurinol and colchicine to use, which he is tolerating with no adverse side effects.    #Health Maintenance: Only needs flu shot.   Past Medical, Surgical, Social, and Family History Reviewed & Updated per EMR.  Objective: BP 145/63  Pulse 64  Temp(Src) 98.5 F (36.9 C) (Oral)  Ht 5\' 10"  (1.778 m)  Wt 159 lb 3.2 oz (72.213 kg)  BMI 22.84 kg/m2  General: alert, well-developed, NAD, cooperative, A&Ox3 HEENT: normocephalic and atraumatic. MMM, EOMI. Pharynx without erythema or lesions. Missing teeth. Neck: supple, no thyromegaly, no carotid bruits. No palpable masses. Lungs: CTAB, normal respiratory effort, no accessory muscle use, no crackles, and no wheezes. Heart: RRR, no M/R/G appreciated Abdomen: soft, NT, ND, BS present and normoactive, no guarding, no rebound tenderness, no hepatosplenomegaly, no palpable masses Msk: no joint swelling, no joint warmth, and no redness over joints.  Extremities: No edema Neurologic: No neurological deficits. Grossly intact. Skin: turgor normal and no rashes.    Assessment/Plan: Please see problem based  Assessment and Plan  Health Maintainance: flu shot given today   Luiz Blare, DO 01/27/2014, 11:03 AM PGY-1, [place]

## 2014-01-27 NOTE — Assessment & Plan Note (Signed)
Flu vaccine given today. 

## 2014-01-27 NOTE — Assessment & Plan Note (Signed)
Patient denies any recent flares. Continue current medication regimen.

## 2014-01-27 NOTE — Patient Instructions (Signed)
Donald Moreno it was great to see you today!  I am pleased to hear that things are going well for you. Continue to stay active and healthy.  Here are some of the things we discussed today: -Flu shot given today -Medications -Ordered blood work on you today. I will call you with any abnormal results.  Please schedule your next annual for next year. You can always schedule an appointment as needed.  Thanks for allowing me to be a part of your care! Dr. Gerarda Fraction

## 2014-01-27 NOTE — Assessment & Plan Note (Signed)
Patient not interested in snuff/chewing tobacco cessation.

## 2014-02-03 ENCOUNTER — Encounter: Payer: Self-pay | Admitting: Internal Medicine

## 2014-02-28 ENCOUNTER — Other Ambulatory Visit: Payer: Self-pay | Admitting: *Deleted

## 2014-02-28 NOTE — Telephone Encounter (Signed)
Pt in nurse clinic for refill on Mobic.  Pt stated he is out of medication.  Derl Barrow, RN

## 2014-03-01 MED ORDER — MELOXICAM 15 MG PO TABS: 15.0000 mg | ORAL_TABLET | Freq: Every day | ORAL | Status: DC | PRN

## 2014-03-31 ENCOUNTER — Encounter: Payer: Self-pay | Admitting: Obstetrics and Gynecology

## 2014-03-31 ENCOUNTER — Ambulatory Visit (INDEPENDENT_AMBULATORY_CARE_PROVIDER_SITE_OTHER): Payer: Medicare Other | Admitting: Obstetrics and Gynecology

## 2014-03-31 VITALS — BP 138/64 | HR 90 | Temp 98.8°F | Ht 70.0 in | Wt 156.0 lb

## 2014-03-31 DIAGNOSIS — M109 Gout, unspecified: Secondary | ICD-10-CM

## 2014-03-31 DIAGNOSIS — M10079 Idiopathic gout, unspecified ankle and foot: Secondary | ICD-10-CM

## 2014-03-31 DIAGNOSIS — M129 Arthropathy, unspecified: Secondary | ICD-10-CM

## 2014-03-31 MED ORDER — COLCHICINE 0.6 MG PO TABS: 1.2000 mg | ORAL_TABLET | ORAL | Status: DC | PRN

## 2014-03-31 MED ORDER — ALLOPURINOL 100 MG PO TABS: 100.0000 mg | ORAL_TABLET | Freq: Every day | ORAL | Status: DC

## 2014-03-31 MED ORDER — COLCHICINE 0.6 MG PO TABS: 0.6000 mg | ORAL_TABLET | ORAL | Status: DC | PRN

## 2014-03-31 NOTE — Progress Notes (Signed)
     Subjective: Chief Complaint  Patient presents with  . Arthritis    HPI: Donald Moreno is a 73 y.o. presenting to clinic today to discuss the following:  #Arthritis: Patient states that he is having more stiffness. Feels like his arthritis is flaring up more. This occurs mainly in both hands. Gets better after ~15 minutes of massaging and moving hands. Is worse at night. The cold weather has exacerbated attacks. Pain sometimes goes up his arms. He uses Mobic as needed. Says affects only lasts about 15 minutes.  #Gout: Patient states that for the last 2 months he's been getting more frequent gout attacks. Says he is getting them about every other day. Denies any exacerbating causes. Currently he takes 300 mg of allopurinol daily and colchicine as needed. Patient states that the colchicine does help with gout flares.  All systems were reviewed and were negative unless otherwise noted in the HPI   Objective: BP 138/64 mmHg  Pulse 90  Temp(Src) 98.8 F (37.1 C) (Oral)  Ht 5\' 10"  (1.778 m)  Wt 156 lb (70.761 kg)  BMI 22.38 kg/m2  General: alert, well-developed, NAD, cooperative Lungs: CTAB, normal respiratory effort Heart: RRR, no M/R/G.  Msk: no joint swelling, no joint warmth, and no redness over joints. Range of motion intact. 5th digit stricture on left hand. Tone & strength normal. Extremities: No cyanosis, clubbing, edema. Neurologic: No focal deficits, +5 strength globally, sensation grossly intact, gait normal, A&Ox3. Deep tendon reflexes symmetrical and normal. Skin: Intact without suspicious lesions or rashes. Warm and dry.  Assessment/Plan: Please see problem based Assessment and Plan  Health Maintainance: Given Rx for Tdap or Tetanus   Luiz Blare, DO 03/31/2014, 2:32 PM PGY-1, Donald Moreno

## 2014-03-31 NOTE — Assessment & Plan Note (Signed)
Flares becoming more frequent. Patient denies any exacerbating factors. Uric acid ordered. Increased allopurinol to 400 mg daily. Patient to continue colchicine as needed for flares.

## 2014-03-31 NOTE — Patient Instructions (Signed)
Donald Moreno it was great to see you today!  I am pleased to hear that things are going well for you.  Here are some of the things we discussed today: -Please take you Mobic daily for your arthritis. This will also help with your gout. -Also I have increased your allopurinal to 400mg /daily. Take your 300mg  pill and a 100mg  pill.    Please schedule a follow-up appointment as needed.   Thanks for allowing me to be a part of your care! Dr. Gerarda Fraction

## 2014-03-31 NOTE — Assessment & Plan Note (Signed)
Increase in arthropathy. Encouraged patient to use warm compresses, continue massages, and continue Mobic as needed.

## 2014-04-01 LAB — URIC ACID: URIC ACID, SERUM: 4.3 mg/dL (ref 4.0–7.8)

## 2014-04-02 ENCOUNTER — Encounter: Payer: Self-pay | Admitting: *Deleted

## 2014-04-02 NOTE — Progress Notes (Unsigned)
Form completed by PCP and faxed to pt's insurance for review.  Derl Barrow, RN

## 2014-04-02 NOTE — Progress Notes (Unsigned)
Prior Authorization received from [facility] for colchicine 0.6 mg.  PA form placed in provider box for completion. Derl Barrow, RN

## 2014-05-12 ENCOUNTER — Other Ambulatory Visit: Payer: Self-pay | Admitting: *Deleted

## 2014-05-12 MED ORDER — LISINOPRIL 40 MG PO TABS: 40.0000 mg | ORAL_TABLET | Freq: Every day | ORAL | Status: DC

## 2014-05-16 ENCOUNTER — Emergency Department (HOSPITAL_COMMUNITY)
Admission: EM | Admit: 2014-05-16 | Discharge: 2014-05-16 | Disposition: A | Discharge status: Home / Self Care | Payer: Medicare Other | Attending: Emergency Medicine | Admitting: Emergency Medicine

## 2014-05-16 ENCOUNTER — Encounter (HOSPITAL_COMMUNITY): Payer: Self-pay | Admitting: Emergency Medicine

## 2014-05-16 DIAGNOSIS — J45909 Unspecified asthma, uncomplicated: Secondary | ICD-10-CM | POA: Diagnosis not present

## 2014-05-16 DIAGNOSIS — R040 Epistaxis: Secondary | ICD-10-CM | POA: Insufficient documentation

## 2014-05-16 DIAGNOSIS — Z79899 Other long term (current) drug therapy: Secondary | ICD-10-CM | POA: Diagnosis not present

## 2014-05-16 DIAGNOSIS — M109 Gout, unspecified: Secondary | ICD-10-CM | POA: Insufficient documentation

## 2014-05-16 DIAGNOSIS — M1990 Unspecified osteoarthritis, unspecified site: Secondary | ICD-10-CM | POA: Diagnosis not present

## 2014-05-16 DIAGNOSIS — I10 Essential (primary) hypertension: Secondary | ICD-10-CM | POA: Diagnosis not present

## 2014-05-16 MED ORDER — SALINE SPRAY 0.65 % NA SOLN
1.0000 | Freq: Once | NASAL | Status: AC
  Administered 2014-05-16: 1 via NASAL
  Filled 2014-05-16: qty 44

## 2014-05-16 NOTE — ED Notes (Addendum)
Pt states he was laying in bed and felt his nose start to bleed, thinks it has been bleeding about 30 mins but bleeding seems to have slowed down since pt arrived here. No injuries to nose, states he has had bad nose bleeds before but it has been a while since his last one. Alert,oriented, respirations equal and unlabored. nad

## 2014-05-16 NOTE — ED Notes (Signed)
MD at bedside. 

## 2014-05-16 NOTE — ED Notes (Signed)
Pt. Reports epistaxis at left nare onset this morning , denies injury , respirations unlabored . Pt. is not taking anticoagulant.

## 2014-05-16 NOTE — ED Provider Notes (Signed)
CSN: [id number]     Arrival date & time 05/16/14  [id number] History   First MD Initiated Contact with Patient 05/16/14 0424     Chief Complaint  Patient presents with  . Epistaxis     (Consider location/radiation/quality/duration/timing/severity/associated sxs/prior Treatment) HPI  Patient presents with concern of epistaxis. This episode began in the hours prior to ED arrival. Bleeding was from the left nostril, with no associated lightheadedness, syncope, pain anywhere else. Patient had minimal relief with pressure.  However, bleeding decreased substantially, after arrival here.   Past Medical History  Diagnosis Date  . Arthritis   . Gout   . Asthma   . Hypertension    History reviewed. No pertinent past surgical history. Family History  Problem Relation Age of Onset  . Asthma Sister   . Heart disease Sister   . Asthma Brother    History  Substance Use Topics  . Smoking status: Never Smoker   . Smokeless tobacco: Current User  . Alcohol Use: 1.2 oz/week    2 Cans of beer per week    Review of Systems  All other systems reviewed and are negative.     Allergies  Review of patient's allergies indicates no known allergies.  Home Medications   Prior to Admission medications   Medication Sig Start Date End Date Taking? Authorizing Provider  ADVAIR DISKUS 500-50 MCG/DOSE AEPB inhale 1 puff INTO THE LUNGS twice a day   Yes Katheren Shams, DO  albuterol (PROAIR HFA) 108 (90 BASE) MCG/ACT inhaler take 2 puffs every 6 hours if needed for wheezing 07/05/13  Yes Andrena Mews, MD  allopurinol (ZYLOPRIM) 100 MG tablet Take 1 tablet (100 mg total) by mouth daily. Add to 300mg  tablet for combined dose of 400mg . 03/31/14  Yes Katheren Shams, DO  allopurinol (ZYLOPRIM) 300 MG tablet Take 1 tablet (300 mg total) by mouth daily. 01/23/14  Yes Katheren Shams, DO  aspirin 81 MG tablet Take 81 mg by mouth daily.   Yes Historical Provider, MD  colchicine (COLCRYS) 0.6 MG tablet Take 2  tablets (1.2 mg total) by mouth as needed. Followed by 1 tab every hour until gout flare subsides. 03/31/14  Yes Katheren Shams, DO  lisinopril (PRINIVIL,ZESTRIL) 40 MG tablet Take 1 tablet (40 mg total) by mouth daily. 05/12/14  Yes Katheren Shams, DO  meloxicam (MOBIC) 15 MG tablet Take 1 tablet (15 mg total) by mouth daily as needed for pain. 03/01/14  Yes Katheren Shams, DO  montelukast (SINGULAIR) 10 MG tablet take 1 tablet by mouth at bedtime 01/23/14  Yes Jazma Y Phelps, DO   BP 123/60 mmHg  Pulse 75  Temp(Src) 98.6 F (37 C) (Oral)  Resp 14  SpO2 99% Physical Exam  Constitutional: He is oriented to person, place, and time. He appears well-developed. No distress.  HENT:  Head: Normocephalic and atraumatic.  Nose:    Eyes: Conjunctivae and EOM are normal.  Cardiovascular: Normal rate and regular rhythm.   Pulmonary/Chest: Effort normal. No stridor. No respiratory distress.  Abdominal: He exhibits no distension.  Musculoskeletal: He exhibits no edema.  Neurological: He is alert and oriented to person, place, and time.  Skin: Skin is warm and dry.  Psychiatric: He has a normal mood and affect.  Nursing note and vitals reviewed.  After the initial evaluation the patient received phenylephrine infused gauze packing in the left nostril.  ED Course  EPISTAXIS MANAGEMENT Date/Time: 05/16/2014 5:50 AM Performed by: Carmin Muskrat Authorized  by: Carmin Muskrat Consent: The procedure was performed in an emergent situation. Verbal consent obtained. Risks and benefits: risks, benefits and alternatives were discussed Consent given by: patient Patient understanding: patient states understanding of the procedure being performed Patient consent: the patient's understanding of the procedure matches consent given Procedure consent: procedure consent matches procedure scheduled Relevant documents: relevant documents present and verified Test results: test results available and properly  labeled Site marked: the operative site was marked Imaging studies: imaging studies available Required items: required blood products, implants, devices, and special equipment available Patient identity confirmed: verbally with patient Time out: Immediately prior to procedure a "time out" was called to verify the correct patient, procedure, equipment, support staff and site/side marked as required. Preparation: Patient was prepped and draped in the usual sterile fashion. Anesthesia: see MAR for details Patient sedated: no Treatment site: left Kiesselbach's area Repair method: silver nitrate Post-procedure assessment: bleeding stopped Treatment complexity: simple Patient tolerance: Patient tolerated the procedure well with no immediate complications   (including critical care time)   MDM   Patient presents with epistaxis, largely resolved on arrival, but with ongoing mild oozing. Following packing with infused gauze, and upper location of silver nitrate there was no ongoing bleeding. He was discharged in stable condition.   Carmin Muskrat, MD 05/16/14 [phone]

## 2014-05-16 NOTE — ED Notes (Addendum)
Ice applied to bridge of nose. ENT cart brought to patient's room.

## 2014-05-16 NOTE — Discharge Instructions (Signed)
For the next two days, use the provided nose spray four times daily.   Nosebleed A nosebleed can be caused by many things, including:  Getting hit hard in the nose.  Infections.  Dry nose.  Colds.  Medicines. Your doctor may do lab testing if you get nosebleeds a lot and the cause is not known. HOME CARE   If your nose was packed with material, keep it there until your doctor takes it out. Put the pack back in your nose if the pack falls out.  Do not blow your nose for 12 hours after the nosebleed.  Sit up and bend forward if your nose starts bleeding again. Pinch the front half of your nose nonstop for 20 minutes.  Put petroleum jelly inside your nose every morning if you have a dry nose.  Use a humidifier to make the air less dry.  Do not take aspirin.  Try not to strain, lift, or bend at the waist for many days after the nosebleed. GET HELP RIGHT AWAY IF:   Nosebleeds keep happening and are hard to stop or control.  You have bleeding or bruises that are not normal on other parts of the body.  You have a fever.  The nosebleeds get worse.  You get lightheaded, feel faint, sweaty, or throw up (vomit) blood. MAKE SURE YOU:   Understand these instructions.  Will watch your condition.  Will get help right away if you are not doing well or get worse. Document Released: 02/09/2008 Document Revised: 07/25/2011 Document Reviewed: 02/09/2008 [facility] Patient Information 2015 [place], [place]. This information is not intended to replace advice given to you by your health care provider. Make sure you discuss any questions you have with your health care provider.

## 2014-07-24 ENCOUNTER — Other Ambulatory Visit: Payer: Self-pay | Admitting: *Deleted

## 2014-07-24 MED ORDER — FLUTICASONE-SALMETEROL 500-50 MCG/DOSE IN AEPB: INHALATION_SPRAY | RESPIRATORY_TRACT | Status: DC

## 2014-07-29 ENCOUNTER — Ambulatory Visit (INDEPENDENT_AMBULATORY_CARE_PROVIDER_SITE_OTHER): Payer: Medicare Other | Admitting: Family Medicine

## 2014-07-29 ENCOUNTER — Encounter: Payer: Self-pay | Admitting: Family Medicine

## 2014-07-29 ENCOUNTER — Other Ambulatory Visit: Payer: Self-pay | Admitting: *Deleted

## 2014-07-29 VITALS — BP 150/66 | HR 62 | Temp 98.6°F | Ht 70.0 in | Wt 163.0 lb

## 2014-07-29 DIAGNOSIS — I10 Essential (primary) hypertension: Secondary | ICD-10-CM

## 2014-07-29 DIAGNOSIS — M129 Arthropathy, unspecified: Secondary | ICD-10-CM | POA: Diagnosis not present

## 2014-07-29 MED ORDER — MONTELUKAST SODIUM 10 MG PO TABS: 10.0000 mg | ORAL_TABLET | Freq: Every day | ORAL | Status: DC

## 2014-07-29 MED ORDER — LISINOPRIL 40 MG PO TABS: 40.0000 mg | ORAL_TABLET | Freq: Every day | ORAL | Status: DC

## 2014-07-29 MED ORDER — MELOXICAM 15 MG PO TABS: 15.0000 mg | ORAL_TABLET | Freq: Every day | ORAL | Status: DC | PRN

## 2014-07-29 NOTE — Progress Notes (Signed)
Patient ID: Donald Moreno, male   DOB: [birth year], 74 y.o.   MRN: [id number]  Tommi Rumps, MD Phone: [phone]  Donald Moreno is a 74 y.o. male who presents today for f/u. States coming in for a check up.   Left leg pain: notes this pain feels like his arthritis. Notes it starts in his hip and moves down to his knee. It is achey. It is intermittent. Has not had any pain since early yesterday. Denies swelling at this time. He does note he took a recent train trip to [place], though he states he got up and walked around the train a significant portion of the day and at every stop they made for the whole trip. He did note some mild swelling of ankles while on the trip. No swelling or redness of thighs or calves since his trip. No pain at this time. Has taken mobic with good relief of pain.   HYPERTENSION Disease Monitoring Home BP Monitoring not checking Chest pain- no    Dyspnea- no Medications Compliance-  Taking lisinopril.  Edema- no   Patient is a nonsmoker.    ROS: Per HPI   Physical Exam Filed Vitals:   07/29/14 1512  BP: 150/66  Pulse:   Temp:     Gen: Well NAD HEENT: PERRL,  MMM Lungs: CTABL Nl WOB Heart: RRR  MSK: no thigh or calf tenderness bilaterally, no thigh or calf swelling or erythema bilaterally, decreased ROM in internal and external ROM in bilateral hips with no pain, bilateral knees with no joint line tenderness, swelling, or erythema, no ligamentous laxity, negative mcmurray's, no cords palpated Exts: Non edematous BL  LE, warm and well perfused.    Assessment/Plan: Please see individual problem list.  Tommi Rumps, MD [facility] PGY-3

## 2014-07-29 NOTE — Patient Instructions (Signed)
Nice to see you. Your leg pain is likely related to your arthritis. If you develop persistent leg pain, swelling, or redness of the leg or chest pain or shortness of breath please seek medical attention.  We are going to have you follow-up in one month with your PCP for your blood pressure.

## 2014-07-29 NOTE — Assessment & Plan Note (Addendum)
Patient with left leg pain likely related to arthritis given origin in hip with decreased ROM in bilateral hips. Doubt DVT at this time with no evidence of swelling, tenderness, or erythema in either LE on exam, no history of blood clot, and symptoms that are intermittent. Also patient with high level of mobility on recent trip. Discussed that he should continue to treat this with mobic. Advised that if he were to develop swelling, recurrent pain, or erythema that he should follow-up immediately and may warrant d-dimer at that time.

## 2014-07-29 NOTE — Assessment & Plan Note (Signed)
At goal for age on recheck. Will continue current regimen. F/u with PCP in one month to ensure still at goal.

## 2014-07-30 ENCOUNTER — Other Ambulatory Visit: Payer: Self-pay | Admitting: *Deleted

## 2014-07-30 MED ORDER — ALLOPURINOL 300 MG PO TABS: 300.0000 mg | ORAL_TABLET | Freq: Every day | ORAL | Status: DC

## 2014-07-30 MED ORDER — ALLOPURINOL 100 MG PO TABS: 100.0000 mg | ORAL_TABLET | Freq: Every day | ORAL | Status: DC

## 2014-07-30 MED ORDER — COLCHICINE 0.6 MG PO TABS: ORAL_TABLET | ORAL | Status: DC

## 2014-07-30 NOTE — Telephone Encounter (Signed)
Patient also requesting rx for flonase nasal spray.

## 2014-07-30 NOTE — Addendum Note (Signed)
Addended by: Leone Haven on: 07/30/2014 03:31 PM   Modules accepted: Level of Service

## 2014-08-01 ENCOUNTER — Telehealth: Payer: Self-pay | Admitting: *Deleted

## 2014-08-01 NOTE — Telephone Encounter (Signed)
Received fax from [facility] needing clarification in zyloprim dosage.  States maximum recommended single dose of 300 mg.  Form placed in provider box for review.  Derl Barrow, RN

## 2014-08-04 NOTE — Telephone Encounter (Signed)
Completed form and placed in the to be faxed pile.

## 2014-09-03 ENCOUNTER — Ambulatory Visit (INDEPENDENT_AMBULATORY_CARE_PROVIDER_SITE_OTHER): Payer: Medicare Other | Admitting: Obstetrics and Gynecology

## 2014-09-03 ENCOUNTER — Encounter: Payer: Self-pay | Admitting: Obstetrics and Gynecology

## 2014-09-03 VITALS — BP 146/64 | HR 85 | Temp 99.1°F | Ht 70.0 in | Wt 161.0 lb

## 2014-09-03 DIAGNOSIS — I10 Essential (primary) hypertension: Secondary | ICD-10-CM | POA: Diagnosis not present

## 2014-09-03 DIAGNOSIS — M129 Arthropathy, unspecified: Secondary | ICD-10-CM | POA: Diagnosis not present

## 2014-09-03 NOTE — Assessment & Plan Note (Signed)
A: Initially elevated but on recheck blood pressure at goal for age.  P: Continue current regimen. Goal blood pressure less than 150/80. Patient advised to monitor blood pressures at least once weekly at home to monitor for continued elevation. He is to return to clinic if blood pressures are above goal.

## 2014-09-03 NOTE — Progress Notes (Signed)
    Subjective: Chief Complaint  Patient presents with  . Hypertension    HPI: Donald Moreno is a 74 y.o. presenting to clinic today to discuss the following:  #Hypertension: Blood pressure at home: Patient does not monitor his blood pressures at home Blood pressure today: First blood pressure check blood pressure 163/76 but on repeat 146/64 Taking Meds: Lisinopril 40 mg Side effects: No side effects ROS: Denies headache, dizziness, visual changes, nausea, vomiting, chest pain, abdominal pain or shortness of breath. Compliant with medications, just taken his medications an hour before visit  #Leg pain: Patient recently seen for leg pain. -resolved now -left leg -Patient continued to take his mobic which helps with his arthritic pain -he is able to get around and do his yard work  Health Maintenance: Up to date.  ROS reviewed and were negative unless otherwise noted in HPI. Pertinently, no chest pain, palpitations, SOB, Fever, Chills, Abd pain, N/V/D.  Past Medical, Surgical, Social, and Family History Reviewed & Updated per EMR.   Objective: BP 146/64 mmHg  Pulse 85  Temp(Src) 99.1 F (37.3 C) (Oral)  Ht 5\' 10"  (1.778 m)  Wt 161 lb (73.029 kg)  BMI 23.10 kg/m2  Physical Exam  Constitutional: He is oriented to person, place, and time and well-developed, well-nourished, and in no distress.  HENT:  Head: Normocephalic and atraumatic.  Neck: Normal range of motion. Neck supple. No thyromegaly present.  Cardiovascular: Normal rate, regular rhythm, normal heart sounds and intact distal pulses.   Pulmonary/Chest: Effort normal and breath sounds normal.  Musculoskeletal: Normal range of motion. He exhibits no edema or tenderness.  Neurological: He is alert and oriented to person, place, and time.  Skin: Skin is warm and dry.  Psychiatric: Mood and affect normal.    Assessment/Plan: Please see problem based Assessment and Plan  Health Maintainance: Up to date   Luiz Blare, DO 09/03/2014, 3:22 PM PGY-1, [place]

## 2014-09-03 NOTE — Patient Instructions (Signed)
Mr. Merrick it was great to see you today!  I am pleased to hear that things are going well for you.  We do not need to change your BP medication at this time. However I want you to try and check it once a week so I can have an idea of what your BPs are at home. If they remain >150/80 please return to have medications adjusted.  Thanks for allowing me to be a part of your care! Dr. Gerarda Fraction

## 2014-09-03 NOTE — Assessment & Plan Note (Signed)
Resolved patient continuing to take mobic daily.

## 2014-10-14 ENCOUNTER — Encounter: Payer: Self-pay | Admitting: Gastroenterology

## 2014-10-16 ENCOUNTER — Encounter: Payer: Self-pay | Admitting: Family Medicine

## 2014-10-16 ENCOUNTER — Encounter (HOSPITAL_COMMUNITY)
Admission: EM | Disposition: A | Discharge status: Home / Self Care | Payer: Self-pay | Source: Home / Self Care | Attending: Family Medicine

## 2014-10-16 ENCOUNTER — Encounter (HOSPITAL_COMMUNITY): Payer: Self-pay

## 2014-10-16 ENCOUNTER — Inpatient Hospital Stay (HOSPITAL_COMMUNITY): Payer: Medicare Other

## 2014-10-16 ENCOUNTER — Inpatient Hospital Stay (HOSPITAL_COMMUNITY)
Admission: EM | Admit: 2014-10-16 | Discharge: 2014-10-21 | DRG: 442 | Disposition: A | Discharge status: Home / Self Care | Payer: Medicare Other | Attending: Family Medicine | Admitting: Family Medicine

## 2014-10-16 ENCOUNTER — Emergency Department (HOSPITAL_COMMUNITY): Payer: Medicare Other

## 2014-10-16 DIAGNOSIS — K7030 Alcoholic cirrhosis of liver without ascites: Secondary | ICD-10-CM | POA: Diagnosis not present

## 2014-10-16 DIAGNOSIS — I493 Ventricular premature depolarization: Secondary | ICD-10-CM | POA: Diagnosis present

## 2014-10-16 DIAGNOSIS — D689 Coagulation defect, unspecified: Secondary | ICD-10-CM | POA: Diagnosis present

## 2014-10-16 DIAGNOSIS — D696 Thrombocytopenia, unspecified: Secondary | ICD-10-CM | POA: Diagnosis not present

## 2014-10-16 DIAGNOSIS — R079 Chest pain, unspecified: Secondary | ICD-10-CM

## 2014-10-16 DIAGNOSIS — Z7951 Long term (current) use of inhaled steroids: Secondary | ICD-10-CM | POA: Diagnosis not present

## 2014-10-16 DIAGNOSIS — Z87891 Personal history of nicotine dependence: Secondary | ICD-10-CM | POA: Diagnosis not present

## 2014-10-16 DIAGNOSIS — K921 Melena: Secondary | ICD-10-CM | POA: Diagnosis not present

## 2014-10-16 DIAGNOSIS — Z538 Procedure and treatment not carried out for other reasons: Secondary | ICD-10-CM | POA: Diagnosis present

## 2014-10-16 DIAGNOSIS — Z7982 Long term (current) use of aspirin: Secondary | ICD-10-CM | POA: Diagnosis not present

## 2014-10-16 DIAGNOSIS — I10 Essential (primary) hypertension: Secondary | ICD-10-CM | POA: Diagnosis not present

## 2014-10-16 DIAGNOSIS — R0789 Other chest pain: Secondary | ICD-10-CM | POA: Diagnosis not present

## 2014-10-16 DIAGNOSIS — K922 Gastrointestinal hemorrhage, unspecified: Secondary | ICD-10-CM | POA: Diagnosis present

## 2014-10-16 DIAGNOSIS — R1084 Generalized abdominal pain: Secondary | ICD-10-CM | POA: Diagnosis not present

## 2014-10-16 DIAGNOSIS — I959 Hypotension, unspecified: Secondary | ICD-10-CM | POA: Diagnosis present

## 2014-10-16 DIAGNOSIS — M109 Gout, unspecified: Secondary | ICD-10-CM | POA: Diagnosis present

## 2014-10-16 DIAGNOSIS — K766 Portal hypertension: Secondary | ICD-10-CM | POA: Diagnosis not present

## 2014-10-16 DIAGNOSIS — D62 Acute posthemorrhagic anemia: Secondary | ICD-10-CM | POA: Diagnosis not present

## 2014-10-16 DIAGNOSIS — F1010 Alcohol abuse, uncomplicated: Secondary | ICD-10-CM | POA: Diagnosis present

## 2014-10-16 DIAGNOSIS — Z79899 Other long term (current) drug therapy: Secondary | ICD-10-CM

## 2014-10-16 DIAGNOSIS — N179 Acute kidney failure, unspecified: Secondary | ICD-10-CM | POA: Diagnosis not present

## 2014-10-16 DIAGNOSIS — J45909 Unspecified asthma, uncomplicated: Secondary | ICD-10-CM | POA: Diagnosis not present

## 2014-10-16 DIAGNOSIS — R109 Unspecified abdominal pain: Secondary | ICD-10-CM

## 2014-10-16 DIAGNOSIS — K3189 Other diseases of stomach and duodenum: Secondary | ICD-10-CM | POA: Diagnosis not present

## 2014-10-16 DIAGNOSIS — K4090 Unilateral inguinal hernia, without obstruction or gangrene, not specified as recurrent: Secondary | ICD-10-CM | POA: Diagnosis not present

## 2014-10-16 DIAGNOSIS — R072 Precordial pain: Secondary | ICD-10-CM

## 2014-10-16 DIAGNOSIS — K769 Liver disease, unspecified: Secondary | ICD-10-CM | POA: Diagnosis not present

## 2014-10-16 DIAGNOSIS — E875 Hyperkalemia: Secondary | ICD-10-CM | POA: Diagnosis present

## 2014-10-16 DIAGNOSIS — E861 Hypovolemia: Secondary | ICD-10-CM | POA: Diagnosis not present

## 2014-10-16 DIAGNOSIS — K7460 Unspecified cirrhosis of liver: Secondary | ICD-10-CM | POA: Diagnosis not present

## 2014-10-16 DIAGNOSIS — D649 Anemia, unspecified: Secondary | ICD-10-CM | POA: Diagnosis not present

## 2014-10-16 HISTORY — DX: Alcohol abuse, uncomplicated: F10.10

## 2014-10-16 LAB — POC OCCULT BLOOD, ED: Fecal Occult Bld: POSITIVE — AB

## 2014-10-16 LAB — CBC WITH DIFFERENTIAL/PLATELET
Basophils Absolute: 0.1 10*3/uL (ref 0.0–0.1)
Basophils Relative: 1 % (ref 0–1)
EOS ABS: 0.2 10*3/uL (ref 0.0–0.7)
EOS PCT: 2 % (ref 0–5)
HEMATOCRIT: 22.8 % — AB (ref 39.0–52.0)
HEMOGLOBIN: 7.8 g/dL — AB (ref 13.0–17.0)
LYMPHS ABS: 2.9 10*3/uL (ref 0.7–4.0)
Lymphocytes Relative: 27 % (ref 12–46)
MCH: 35.5 pg — ABNORMAL HIGH (ref 26.0–34.0)
MCHC: 34.2 g/dL (ref 30.0–36.0)
MCV: 103.6 fL — ABNORMAL HIGH (ref 78.0–100.0)
Monocytes Absolute: 0.7 10*3/uL (ref 0.1–1.0)
Monocytes Relative: 6 % (ref 3–12)
NEUTROS ABS: 7.2 10*3/uL (ref 1.7–7.7)
Neutrophils Relative %: 65 % (ref 43–77)
PLATELETS: 107 10*3/uL — AB (ref 150–400)
RBC: 2.2 MIL/uL — AB (ref 4.22–5.81)
RDW: 14.5 % (ref 11.5–15.5)
WBC: 11.1 10*3/uL — ABNORMAL HIGH (ref 4.0–10.5)

## 2014-10-16 LAB — CBC
HCT: 17.7 % — ABNORMAL LOW (ref 39.0–52.0)
HEMOGLOBIN: 6.3 g/dL — AB (ref 13.0–17.0)
MCH: 36.4 pg — ABNORMAL HIGH (ref 26.0–34.0)
MCHC: 35.6 g/dL (ref 30.0–36.0)
MCV: 102.3 fL — ABNORMAL HIGH (ref 78.0–100.0)
PLATELETS: 86 10*3/uL — AB (ref 150–400)
RBC: 1.73 MIL/uL — ABNORMAL LOW (ref 4.22–5.81)
RDW: 14.9 % (ref 11.5–15.5)
WBC: 11.5 10*3/uL — AB (ref 4.0–10.5)

## 2014-10-16 LAB — COMPREHENSIVE METABOLIC PANEL
ALBUMIN: 2.5 g/dL — AB (ref 3.5–5.0)
ALK PHOS: 74 U/L (ref 38–126)
ALT: 30 U/L (ref 17–63)
AST: 49 U/L — ABNORMAL HIGH (ref 15–41)
Anion gap: 7 (ref 5–15)
BUN: 37 mg/dL — AB (ref 6–20)
CHLORIDE: 107 mmol/L (ref 101–111)
CO2: 23 mmol/L (ref 22–32)
Calcium: 8.4 mg/dL — ABNORMAL LOW (ref 8.9–10.3)
Creatinine, Ser: 1.63 mg/dL — ABNORMAL HIGH (ref 0.61–1.24)
GFR calc non Af Amer: 40 mL/min — ABNORMAL LOW (ref >60)
GFR, EST AFRICAN AMERICAN: 47 mL/min — AB (ref >60)
Glucose, Bld: 113 mg/dL — ABNORMAL HIGH (ref 65–99)
Potassium: 5.5 mmol/L — ABNORMAL HIGH (ref 3.5–5.1)
Sodium: 137 mmol/L (ref 135–145)
TOTAL PROTEIN: 6.1 g/dL — AB (ref 6.5–8.1)
Total Bilirubin: 1.6 mg/dL — ABNORMAL HIGH (ref 0.3–1.2)

## 2014-10-16 LAB — PROTIME-INR
INR: 1.59 — ABNORMAL HIGH (ref 0.00–1.49)
PROTHROMBIN TIME: 19 s — AB (ref 11.6–15.2)

## 2014-10-16 LAB — I-STAT TROPONIN, ED: Troponin i, poc: 0 ng/mL (ref 0.00–0.08)

## 2014-10-16 LAB — ABO/RH: ABO/RH(D): AB POS

## 2014-10-16 LAB — LIPASE, BLOOD: Lipase: 35 U/L (ref 22–51)

## 2014-10-16 LAB — MRSA PCR SCREENING: MRSA by PCR: NEGATIVE

## 2014-10-16 LAB — I-STAT CG4 LACTIC ACID, ED: LACTIC ACID, VENOUS: 1.64 mmol/L (ref 0.5–2.0)

## 2014-10-16 LAB — APTT: APTT: 31 s (ref 24–37)

## 2014-10-16 LAB — TROPONIN I
TROPONIN I: 0.03 ng/mL (ref <0.031)
Troponin I: <0.03 ng/mL (ref <0.031)

## 2014-10-16 LAB — PREPARE RBC (CROSSMATCH)

## 2014-10-16 SURGERY — CANCELLED PROCEDURE

## 2014-10-16 MED ORDER — ALLOPURINOL 300 MG PO TABS
300.0000 mg | ORAL_TABLET | Freq: Every day | ORAL | Status: DC
  Administered 2014-10-17 – 2014-10-21 (×5): 300 mg via ORAL
  Filled 2014-10-16 (×5): qty 1

## 2014-10-16 MED ORDER — DIPHENHYDRAMINE HCL 50 MG/ML IJ SOLN
INTRAMUSCULAR | Status: AC
  Filled 2014-10-16: qty 1

## 2014-10-16 MED ORDER — SODIUM CHLORIDE 0.9 % IV BOLUS (SEPSIS)
1000.0000 mL | Freq: Once | INTRAVENOUS | Status: AC
  Administered 2014-10-16: 1000 mL via INTRAVENOUS

## 2014-10-16 MED ORDER — PANTOPRAZOLE SODIUM 40 MG IV SOLR
40.0000 mg | Freq: Once | INTRAVENOUS | Status: AC
  Administered 2014-10-16: 40 mg via INTRAVENOUS
  Filled 2014-10-16: qty 40

## 2014-10-16 MED ORDER — BUTAMBEN-TETRACAINE-BENZOCAINE 2-2-14 % EX AERO
INHALATION_SPRAY | CUTANEOUS | Status: DC | PRN
  Administered 2014-10-16: 2 via TOPICAL

## 2014-10-16 MED ORDER — ALBUTEROL SULFATE (2.5 MG/3ML) 0.083% IN NEBU
2.5000 mg | INHALATION_SOLUTION | RESPIRATORY_TRACT | Status: DC | PRN
  Administered 2014-10-16 – 2014-10-18 (×2): 2.5 mg via RESPIRATORY_TRACT
  Filled 2014-10-16: qty 3

## 2014-10-16 MED ORDER — SODIUM CHLORIDE 0.9 % IV SOLN
Freq: Once | INTRAVENOUS | Status: AC
  Administered 2014-10-16: 23:00:00 via INTRAVENOUS

## 2014-10-16 MED ORDER — SODIUM CHLORIDE 0.9 % IV SOLN
INTRAVENOUS | Status: DC
  Administered 2014-10-16 – 2014-10-19 (×3): via INTRAVENOUS
  Filled 2014-10-16: qty 1000

## 2014-10-16 MED ORDER — SODIUM CHLORIDE 0.9 % IV SOLN
8.0000 mg/h | INTRAVENOUS | Status: DC
  Administered 2014-10-16 – 2014-10-18 (×3): 8 mg/h via INTRAVENOUS
  Filled 2014-10-16 (×7): qty 80

## 2014-10-16 MED ORDER — FENTANYL CITRATE (PF) 100 MCG/2ML IJ SOLN
INTRAMUSCULAR | Status: AC
  Filled 2014-10-16: qty 2

## 2014-10-16 MED ORDER — PANTOPRAZOLE SODIUM 40 MG IV SOLR: 40.0000 mg | Freq: Two times a day (BID) | INTRAVENOUS | Status: DC

## 2014-10-16 MED ORDER — MIDAZOLAM HCL 10 MG/2ML IJ SOLN
INTRAMUSCULAR | Status: DC | PRN
  Administered 2014-10-16: 2 mg via INTRAVENOUS
  Administered 2014-10-16: 1 mg via INTRAVENOUS
  Administered 2014-10-16: 2 mg via INTRAVENOUS

## 2014-10-16 MED ORDER — SUCRALFATE 1 GM/10ML PO SUSP
1.0000 g | Freq: Four times a day (QID) | ORAL | Status: DC
  Administered 2014-10-16 – 2014-10-21 (×17): 1 g via ORAL
  Filled 2014-10-16 (×23): qty 10

## 2014-10-16 MED ORDER — SODIUM CHLORIDE 0.9 % IV SOLN
80.0000 mg | Freq: Once | INTRAVENOUS | Status: AC
  Administered 2014-10-16: 80 mg via INTRAVENOUS
  Filled 2014-10-16: qty 80

## 2014-10-16 MED ORDER — SODIUM CHLORIDE 0.9 % IV SOLN
INTRAVENOUS | Status: DC
  Administered 2014-10-17: 75 mL/h via INTRAVENOUS
  Administered 2014-10-19: 02:00:00 via INTRAVENOUS

## 2014-10-16 MED ORDER — ALBUTEROL SULFATE HFA 108 (90 BASE) MCG/ACT IN AERS: 2.0000 | INHALATION_SPRAY | Freq: Four times a day (QID) | RESPIRATORY_TRACT | Status: DC | PRN

## 2014-10-16 MED ORDER — LORAZEPAM 2 MG/ML IJ SOLN: 2.0000 mg | INTRAMUSCULAR | Status: DC | PRN

## 2014-10-16 MED ORDER — MIDAZOLAM HCL 5 MG/ML IJ SOLN
INTRAMUSCULAR | Status: AC
  Filled 2014-10-16: qty 2

## 2014-10-16 MED ORDER — FENTANYL CITRATE (PF) 100 MCG/2ML IJ SOLN
INTRAMUSCULAR | Status: DC | PRN
  Administered 2014-10-16 (×2): 25 ug via INTRAVENOUS

## 2014-10-16 MED ORDER — MOMETASONE FURO-FORMOTEROL FUM 200-5 MCG/ACT IN AERO
2.0000 | INHALATION_SPRAY | Freq: Two times a day (BID) | RESPIRATORY_TRACT | Status: DC
  Administered 2014-10-17 – 2014-10-21 (×8): 2 via RESPIRATORY_TRACT
  Filled 2014-10-16 (×2): qty 8.8

## 2014-10-16 NOTE — Progress Notes (Signed)
Pt just got here from ED. Pt is alert and oriented x 4. Pt given a CHG bath and settled into bed. Pt has no complaints at this time. Nurse tech is calling Telemetry to notify. Mrsa swab sent to lab. Pt's vitals within normal limits.

## 2014-10-16 NOTE — ED Notes (Signed)
Gi at bedside performing egd

## 2014-10-16 NOTE — ED Notes (Signed)
Pt here for left lower chest pain, back pain, stomach pain and rectal bleeding that started bothering him this morning at 0300. Denies taking any blood thinners. Only reports taking ASA. Has gone to the bathroom 3 x and the commode has been "black" each time. Pt states he hurts all over.

## 2014-10-16 NOTE — Progress Notes (Signed)
Attempted endoscopic evaluation for  evaluation of patient's GI bleeding was unsuccessful, because of patient combativeness. despite an increase in sedation and having several people try to hold the patient down, he was combative, pushing the scope away, taking swings at the staff, and so forth.  It was felt that further sedation was not safe, and that continued attempts at endoscopy would entail excessive risk for patient and staff safety.  Accordingly the procedure was aborted.  Recommendations:  1.  Maximal medical therapy (e.g., Protonix 80mg  IV bolus followed by 8 mg/hr constant infusion, PLUS sucralfate suspension 1 gm qid).  2.  Monitor hgb and VS carefully; if the patient shows signs of acute destabilizing bleeding, options would include egd in O.R., under propofol sedation, or a bleeding scan to verify whether bleeding is from upper or lower GI tract (could pass an NG tube for a diagnostic lavage for the same purpose).  3.  The patient should have eventual elective outpatient surveillance colonoscopy (if he will let us---he indicated "never again," but he might agree if it's under propofol sedation), in which case we could do a concurrent egd to look for any persistent abnormality in the upper GI tract.  Will follow patient with you.  Cleotis Nipper, M.D. Pager [phone] If no answer or after 5 PM call [phone]

## 2014-10-16 NOTE — ED Notes (Signed)
Called to give report X1. Secretary stated nurse was in meeting and would call back.

## 2014-10-16 NOTE — ED Notes (Signed)
amb to the bathroom

## 2014-10-16 NOTE — ED Notes (Signed)
PA at bedside.

## 2014-10-16 NOTE — ED Notes (Signed)
Attempted IV twice unable to gain IV access. Second nurse will attempt second IV and blood draw.

## 2014-10-16 NOTE — H&P (Signed)
[facility] Admission History and Physical Service Pager: [phone]  [name] Medical record number: [id number] Date of birth: 07/06/1940 Age: 74 y.o. Gender: male  Primary Care Provider: Diona Fanti, DO Consultants: gastroenterology, cardiology Code Status: FULL for now, will verify once awake  Chief Complaint: melena, hematochezia, atypical chest pain.  Assessment and Plan: [name] is a 74 y.o. male presenting with melena, hematochezia, atypical chest pain. PMH significant for arthritis, gout, asthma, HTN and ETOH abuse presenting with melena, fatigue, abdominal pain, and atypical chest pain.   Melena/Hematochezia: FOBT+, abd u/s with cirrhosis and a benign hepatic cyst, gallbladder wall thickening w/out Murphy's sign. CT abd hepatic cirrhosis, ascites, gallbladder wall thickening, sequela of AVN on b/l femoral head w/ mild-mod degenerative changes. Hgb 7.8, Plt 107, LA 1.64, K 5.5, 1.63, Bili 1.6 -c/s GI, appreciate recs:  -EGD today unsuccessful 2/2 combativeness under sedation  -Discussed care with Dr Cristina Gong, GI. Recommends maximum medical management with protonix infusion and carafate.  Recommends tagged RBC study vs conscious EGD vs EGD with propofol if need for further evaluation of GI bleed (becomes hemodynamically unstable or bleed becomes more brisk)  -Will need outpatient colonoscopy -repeat CBC 1800 -CBC in am  Atypical chest pain: EKG with PVCs and trigeminy.  Trop neg x1. LA normal.  -c/s cardiology, recommend cycling troponins but no further ischemic work up at this time. Consider nuclear stress test outpatient -telemetry -cycle troponins q6 x2 -ASA held in setting of GI bleed  AKI: Cr 1.63 (1.07 in 01/2014) -s/p 2L IVF -gentle IVF hydration with PO clears -BMET in am  ETOH abuse: AST 49, ALT 30, bilirubin 1.6, platelets 107 (normal 2 years ago) -CIWA protocol  HTN: hypotensive in ED to 70/40's. On  Lisinopril at home. -Hold home med for now  Gout: on Allopurinol and Colcrys at home -Continue allopurinol  FEN/GI: Clears, Protonix infusion Prophylaxis: SCDs in setting of GI bleed  Disposition: Admit to SDU.  Anticipate discharge home with family once improved.  History of Present Illness: Donald Moreno is a 74 y.o. male presenting with GI bleed and atypical chest pain.  Unfortunately, patient sedated s/p EGD attempt so history obtained per ED, cardiology and gastroenterology notes.  It appears that patient had black tarry stools that began around 3am today.  Last couple of BMs were tarry with trace red blood.  Patient endorsed light headedness, fatigue and generalized abdominal pain to Cardiologist along with some dull CP.  No SOB, N/V.  Patient normally active at home.  Lives with wife.  Performs ADLs independently.  H/O ETOH abuse, currently 3 beers/day.  Review Of Systems: Per HPI with the following additions: none Otherwise 12 point review of systems was performed and was unremarkable.  Patient Active Problem List   Diagnosis Date Noted  . Upper GI bleed 10/16/2014  . Preventative health care 11/11/2011  . ASTHMA, PERSISTENT 01/29/2010  . NICOTINE ADDICTION 04/30/2007  . Gout 07/13/2006  . HYPERTENSION, BENIGN SYSTEMIC 07/13/2006  . Arthropathy 07/13/2006   Past Medical History: Past Medical History  Diagnosis Date  . Arthritis   . Gout   . Asthma   . Hypertension   . ETOH abuse    Past Surgical History: History reviewed. No pertinent past surgical history. Social History: History  Substance Use Topics  . Smoking status: Never Smoker   . Smokeless tobacco: Current User  . Alcohol Use: 1.2 oz/week    2 Cans of beer per week   Additional  social history: ETOH use daily, no drugs  Please also refer to relevant sections of EMR.  Family History: Family History  Problem Relation Age of Onset  . Asthma Sister   . Heart disease Sister   . Asthma Brother     Allergies and Medications: No Known Allergies No current facility-administered medications on file prior to encounter.   Current Outpatient Prescriptions on File Prior to Encounter  Medication Sig Dispense Refill  . albuterol (PROAIR HFA) 108 (90 BASE) MCG/ACT inhaler take 2 puffs every 6 hours if needed for wheezing 8.5 g 5  . allopurinol (ZYLOPRIM) 100 MG tablet Take 1 tablet (100 mg total) by mouth daily. Add to 300mg  tablet for combined dose of 400mg . 60 tablet 3  . allopurinol (ZYLOPRIM) 300 MG tablet Take 1 tablet (300 mg total) by mouth daily. 60 tablet 3  . aspirin 81 MG tablet Take 81 mg by mouth daily.    . Fluticasone-Salmeterol (ADVAIR DISKUS) 500-50 MCG/DOSE AEPB Inhale 1 puff INTO THE LUNGS twice a day 60 each 6  . lisinopril (PRINIVIL,ZESTRIL) 40 MG tablet Take 1 tablet (40 mg total) by mouth daily. 90 tablet 3  . meloxicam (MOBIC) 15 MG tablet Take 1 tablet (15 mg total) by mouth daily as needed for pain. 30 tablet 5  . montelukast (SINGULAIR) 10 MG tablet Take 1 tablet (10 mg total) by mouth at bedtime. 90 tablet 6  . colchicine (COLCRYS) 0.6 MG tablet Take 2 tablets(1.2mg  total) at first sign of flare. One hour later take 1 tablet (0.6mg ). (Patient taking differently: Take 0.6-1.2 mg by mouth as needed (gout flare). Take 2 tablets(1.2mg  total) at first sign of flare. One hour later take 1 tablet (0.6mg ).) 60 tablet 3    Objective: BP 139/65 mmHg  Pulse 98  Temp(Src) 98.1 F (36.7 C) (Oral)  Resp 23  Ht 5\' 10"  (1.778 m)  Wt 165 lb (74.844 kg)  BMI 23.68 kg/m2  SpO2 100% Exam: General: sedated, NAD Eyes: closed ENTM: closed Neck: no LAD, no JVD appreciated Cardiovascular: RRR, soft blowing murmur at apex of heart Respiratory: CTAB, no increased WOB, on 2L O2 [place] Abdomen: flat, soft, ND, +BS MSK: no edema, +2DP, warm Skin: dry, intact, no rashes Neuro: sedated Psych: sedated  Labs and Imaging: CBC BMET   Recent Labs Lab 10/16/14 0915  WBC 11.1*  HGB  7.8*  HCT 22.8*  PLT 107*    Recent Labs Lab 10/16/14 0915  NA 137  K 5.5*  CL 107  CO2 23  BUN 37*  CREATININE 1.63*  GLUCOSE 113*  CALCIUM 8.4*     Ct Abdomen Pelvis Wo Contrast  10/16/2014   CLINICAL DATA:  Left lower chest pain. Back pain. Stomach pain. Rectal bleeding.  EXAM: CT ABDOMEN AND PELVIS WITHOUT CONTRAST  TECHNIQUE: Multidetector CT imaging of the abdomen and pelvis was performed following the standard protocol without IV contrast.  COMPARISON:  None.  FINDINGS: The lack of intravenous contrast limits the ability to evaluate solid abdominal organs.  There is mild nodularity of the hepatic contour suggestive of cirrhosis. This finding is associated with minimal amount of intra-abdominal ascites. Note is made of an approximately 1.2 cm hypo attenuating (9 Hounsfield unit) lesion within the central aspect of the liver (image 17, series 2), incompletely characterized on this noncontrast examination. No radiopaque gallstones however there is mild diffuse apparent thickening of the gallbladder wall, nonspecific finding given gallbladder underdistention and minimal amount of intra-abdominal ascites.  Normal noncontrast appearance of the  bilateral kidneys. No renal stones. No renal stones are seen along the expected course of either ureter or the urinary bladder. Normal noncontrast appearance of the urinary bladder given degree distention.  Normal noncontrast appearance of the bilateral adrenal glands and spleen. No evidence of splenomegaly. Incidental note is made of a small splenule. There is a minimal amount of fluid tracking within the root of the mesentery which is favored to be secondary to minimal amount of intra-abdominal ascites. Otherwise, normal noncontrast appearance of the pancreas.  The bowel is normal in course and caliber without wall thickening or evidence of obstruction. Normal noncontrast appearance of the appendix. No pneumoperitoneum, pneumatosis or portal venous gas.   Scattered atherosclerotic plaque within a normal caliber abdominal aorta.  Scattered shotty porta hepatis, retroperitoneal and mesenteric lymph nodes are numerous though individually not enlarged by size criteria with index gastrohepatic ligament lymph node measuring 1.3 cm in greatest short axis diameter (coronal image 81, series 5), aortocaval lymph node measuring 0.6 cm in greatest short axis diameter (image 34, series 2) and index mesenteric lymph node measuring 0.5 cm in diameter (image 53, series 2).  Scattered calcifications within a normal sized prostate gland. There is a small amount of fluid seen within the pelvic cul-de-sac and within the bilateral inguinal canals.  Limited visualization of lower thorax is negative for focal airspace opacity or pleural effusion.  Normal heart size. There is diffuse decreased attenuation of the intra cardiac blood pool suggestive of anemia.  No acute or aggressive osseous abnormalities.  Avascular necrosis involving the bilateral femoral heads with associated mild-to-moderate bilateral hip degenerative change, articular surface collapse and irregularity, left greater than right.  Tiny left-sided mesenteric fat containing inguinal hernia. Regional soft tissues appear otherwise normal.  IMPRESSION: 1. Nodularity hepatic contour for suggestive of hepatic cirrhosis. This finding is associated with a small volume intra-abdominal ascites. 2. There is stranding throughout the root of the mesentery, presumably the sequela of small volume intra-abdominal ascites. Otherwise, normal noncontrast appearance of the pancreas. If there is clinical concern for acute pancreatitis, further evaluation with serum amylase and lipase levels is recommended. 3. Mild diffuse gallbladder wall thickening, presumably secondary to underdistention and small amount of intra-abdominal ascites, however if there is clinical concern for acute cholecystitis, further evaluation could be performed with right  upper quadrant abdominal ultrasound as indicated. 4. Punctate (approximately 1.2 cm) hypo attenuating lesion within the central aspect of the liver, incompletely characterized on this noncontrast examination, and while potentially representative of a hepatic cyst given suspected hepatic cirrhosis, further evaluation with nonemergent abdominal MRI could be performed as clinically indicated. 5. Sequela of avascular necrosis involving the bilateral femoral heads with associated mild-to-moderate bilateral hip degenerative change and bilateral femoral head articular surface collapse and irregularity, left greater than right.   Electronically Signed   By: Sandi Mariscal M.D.   On: 10/16/2014 11:55   Dg Chest 2 View  10/16/2014   CLINICAL DATA:  Chest pain.  EXAM: CHEST  2 VIEW  COMPARISON:  August 05, 2003.  FINDINGS: The heart size and mediastinal contours are within normal limits. Both lungs are clear. No pneumothorax or pleural effusion is noted. The visualized skeletal structures are unremarkable.  IMPRESSION: No active cardiopulmonary disease.   Electronically Signed   By: Marijo Conception, M.D.   On: 10/16/2014 09:46   US Abdomen Limited Ruq  10/16/2014   CLINICAL DATA:  74 year old male with abnormal liver and gallbladder and small volume ascites on noncontrast CT Abdomen  and Pelvis. Initial encounter.  EXAM: US ABDOMEN LIMITED - RIGHT UPPER QUADRANT  COMPARISON:  CT Abdomen and Pelvis 1125 hours today.  FINDINGS: Gallbladder:  Decompressed with thickening of the gallbladder wall up to 5-6 mm. No sonographic Murphy sign elicited. No sludge or stones identified within the gallbladder lumen.  Common bile duct:  Diameter: 3 mm, normal  Liver:  Nodular liver contour re- identified. No intrahepatic biliary ductal dilatation. Simple appearing cyst in the central liver measuring 17 mm and corresponding to the low-density finding on the CT earlier today.  Other findings: Small volume perihepatic ascites is less apparent  (image 34). Negative visualized right kidney.  IMPRESSION: 1. Cirrhotic liver. The small central hypodense liver lesions seen on the earlier CT is a benign cyst. 2. Gallbladder wall thickening without stones or sonographic Murphy sign most likely is reactive / sequelae of chronic liver disease.   Electronically Signed   By: Genevie Ann M.D.   On: 10/16/2014 13:21    Janora Norlander, DO 10/16/2014, 1:38 PM PGY-1, [facility] pager: [phone], text pages welcome

## 2014-10-16 NOTE — ED Notes (Signed)
Report to Herbert Spires, Therapist, sports and Marya Amsler, Therapist, sports.  Pt care transferred

## 2014-10-16 NOTE — Progress Notes (Signed)
CRITICAL VALUE ALERT  Critical value received: Hemoglobin 6.3  Date of notification: 10/16/14  Time of notification: 2008  Critical value read back: Yes  Nurse who received alert: Leary Roca  Time of first page: 2015  Responding MD: Dr. Georgena Spurling  Time MD responded: 2023

## 2014-10-16 NOTE — ED Notes (Signed)
Patient transported to Ultrasound 

## 2014-10-16 NOTE — ED Provider Notes (Signed)
CSN: [id number]     Arrival date & time 10/16/14  [id number] History   First MD Initiated Contact with Patient 10/16/14 [phone]     Chief Complaint  Patient presents with  . Chest Pain  . Rectal Bleeding     (Consider location/radiation/quality/duration/timing/severity/associated sxs/prior Treatment) The history is provided by the patient. No language interpreter was used.  Donald Moreno is a 74 y/o M with PMHx of arthritis, gout, asthma, HTN presenting to the ED with abdominal pain, chest pain, and melena that started this morning. Patient reported that he has been dealing with abdominal pain and chest pain for years, but noted an exacerbation yesterday evening. Patient reported that he woke up and had a BM at 3:00AM this morning that was noted to melenic. Patient reported that he has had at least 3 BM this morning that were of melenic stools. Patient reported that his abdominal pain is localized to the upper aspect of his abdomen described as a tightness, as well as chest pain described as a tightness. Patient reported that he does take ASA 81 mg daily - reported that he took ASA today. Reported that he has been eating hot peppers and spicy food all of his life. Reported that he does take mobic for his arthritis. Reported that he does drink alcohol "every chance he gets" stated that his last use was yesterday where he drank at least 2-3 beers. Denied blood thinners, arm pain, jaw pain, dizziness, fainting, hemtochezia, urine decreased, hematuria, dysuria, hemoptysis, nausea, vomiting, leg swelling, travels, cough.  PCP Dr. Gerarda Fraction  Past Medical History  Diagnosis Date  . Arthritis   . Gout   . Asthma   . Hypertension   . ETOH abuse    History reviewed. No pertinent past surgical history. Family History  Problem Relation Age of Onset  . Asthma Sister   . Heart disease Sister   . Asthma Brother    History  Substance Use Topics  . Smoking status: Never Smoker   . Smokeless tobacco: Current User   . Alcohol Use: 1.2 oz/week    2 Cans of beer per week    Review of Systems  Constitutional: Negative for fever and chills.  Eyes: Negative for visual disturbance.  Respiratory: Negative for chest tightness and shortness of breath.   Cardiovascular: Positive for chest pain. Negative for leg swelling.  Gastrointestinal: Positive for abdominal pain and blood in stool. Negative for nausea, vomiting, diarrhea and constipation.  Genitourinary: Negative for dysuria and hematuria.  Neurological: Negative for dizziness, weakness, numbness and headaches.      Allergies  Review of patient's allergies indicates no known allergies.  Home Medications   Prior to Admission medications   Medication Sig Start Date End Date Taking? Authorizing Provider  albuterol (PROAIR HFA) 108 (90 BASE) MCG/ACT inhaler take 2 puffs every 6 hours if needed for wheezing 07/05/13  Yes Kinnie Feil, MD  allopurinol (ZYLOPRIM) 100 MG tablet Take 1 tablet (100 mg total) by mouth daily. Add to 300mg  tablet for combined dose of 400mg . 07/30/14  Yes Katheren Shams, DO  allopurinol (ZYLOPRIM) 300 MG tablet Take 1 tablet (300 mg total) by mouth daily. 07/30/14  Yes Katheren Shams, DO  aspirin 81 MG tablet Take 81 mg by mouth daily.   Yes Historical Provider, MD  Fluticasone-Salmeterol (ADVAIR DISKUS) 500-50 MCG/DOSE AEPB Inhale 1 puff INTO THE LUNGS twice a day 07/24/14  Yes Katheren Shams, DO  lisinopril (PRINIVIL,ZESTRIL) 40 MG tablet Take 1 tablet (  40 mg total) by mouth daily. 07/29/14  Yes Katheren Shams, DO  meloxicam (MOBIC) 15 MG tablet Take 1 tablet (15 mg total) by mouth daily as needed for pain. 07/29/14  Yes Katheren Shams, DO  montelukast (SINGULAIR) 10 MG tablet Take 1 tablet (10 mg total) by mouth at bedtime. 07/29/14  Yes Katheren Shams, DO  colchicine (COLCRYS) 0.6 MG tablet Take 2 tablets(1.2mg  total) at first sign of flare. One hour later take 1 tablet (0.6mg ). Patient taking differently: Take 0.6-1.2 mg by  mouth as needed (gout flare). Take 2 tablets(1.2mg  total) at first sign of flare. One hour later take 1 tablet (0.6mg ). 07/30/14   Katheren Shams, DO   BP 104/45 mmHg  Pulse 95  Temp(Src) 98.1 F (36.7 C) (Oral)  Resp 19  Ht 5\' 10"  (1.778 m)  Wt 165 lb (74.844 kg)  BMI 23.68 kg/m2  SpO2 100% Physical Exam  Constitutional: He is oriented to person, place, and time. He appears well-developed and well-nourished. No distress.  HENT:  Head: Normocephalic and atraumatic.  Mouth/Throat: Oropharynx is clear and moist. No oropharyngeal exudate.  Eyes: Conjunctivae and EOM are normal. Pupils are equal, round, and reactive to light. Right eye exhibits no discharge. Left eye exhibits no discharge.  Neck: Normal range of motion. Neck supple. No tracheal deviation present.  Cardiovascular: Normal rate, regular rhythm and normal heart sounds.  Exam reveals no friction rub.   No murmur heard. Pulmonary/Chest: Effort normal and breath sounds normal. No respiratory distress. He has no wheezes. He has no rales. He exhibits tenderness.  Patient is able to speak in full sentences without difficulty  Negative use of accessory muscles Negative stridor  Pain is reproducible upon palpation to the chest wal  Abdominal: Soft. Bowel sounds are normal. He exhibits no distension. There is tenderness in the periumbilical area. There is no rebound and no guarding.  Genitourinary:  Rectal Exam: Negative swelling, erythema, inflammation, lesions, sores, deformities, hemorrhoids. Bright red blood surrounding the anus with a mixture of black tarry stools. Black tarry and bright red noted on glove.  Exam chaperoned with nurse, Jerene Pitch.   Musculoskeletal: Normal range of motion.  Lymphadenopathy:    He has no cervical adenopathy.  Neurological: He is alert and oriented to person, place, and time. No cranial nerve deficit. He exhibits normal muscle tone. Coordination normal. GCS eye subscore is 4. GCS verbal subscore is 5.  GCS motor subscore is 6.  Skin: Skin is warm and dry. No rash noted. He is not diaphoretic. No erythema.  Psychiatric: He has a normal mood and affect. His behavior is normal. Thought content normal.  Nursing note and vitals reviewed.   ED Course  Procedures (including critical care time)  Results for orders placed or performed during the hospital encounter of 10/16/14  Comprehensive metabolic panel  Result Value Ref Range   Sodium 137 135 - 145 mmol/L   Potassium 5.5 (H) 3.5 - 5.1 mmol/L   Chloride 107 101 - 111 mmol/L   CO2 23 22 - 32 mmol/L   Glucose, Bld 113 (H) 65 - 99 mg/dL   BUN 37 (H) 6 - 20 mg/dL   Creatinine, Ser 1.63 (H) 0.61 - 1.24 mg/dL   Calcium 8.4 (L) 8.9 - 10.3 mg/dL   Total Protein 6.1 (L) 6.5 - 8.1 g/dL   Albumin 2.5 (L) 3.5 - 5.0 g/dL   AST 49 (H) 15 - 41 U/L   ALT 30 17 - 63 U/L  Alkaline Phosphatase 74 38 - 126 U/L   Total Bilirubin 1.6 (H) 0.3 - 1.2 mg/dL   GFR calc non Af Amer 40 (L) >60 mL/min   GFR calc Af Amer 47 (L) >60 mL/min   Anion gap 7 5 - 15  Troponin I  Result Value Ref Range   Troponin I <0.03 <0.031 ng/mL  CBC with Differential/Platelet  Result Value Ref Range   WBC 11.1 (H) 4.0 - 10.5 K/uL   RBC 2.20 (L) 4.22 - 5.81 MIL/uL   Hemoglobin 7.8 (L) 13.0 - 17.0 g/dL   HCT 22.8 (L) 39.0 - 52.0 %   MCV 103.6 (H) 78.0 - 100.0 fL   MCH 35.5 (H) 26.0 - 34.0 pg   MCHC 34.2 30.0 - 36.0 g/dL   RDW 14.5 11.5 - 15.5 %   Platelets 107 (L) 150 - 400 K/uL   Neutrophils Relative % 65 43 - 77 %   Neutro Abs 7.2 1.7 - 7.7 K/uL   Lymphocytes Relative 27 12 - 46 %   Lymphs Abs 2.9 0.7 - 4.0 K/uL   Monocytes Relative 6 3 - 12 %   Monocytes Absolute 0.7 0.1 - 1.0 K/uL   Eosinophils Relative 2 0 - 5 %   Eosinophils Absolute 0.2 0.0 - 0.7 K/uL   Basophils Relative 1 0 - 1 %   Basophils Absolute 0.1 0.0 - 0.1 K/uL  Protime-INR  Result Value Ref Range   Prothrombin Time 19.0 (H) 11.6 - 15.2 seconds   INR 1.59 (H) 0.00 - 1.49  APTT  Result Value Ref  Range   aPTT 31 24 - 37 seconds  Lipase, blood  Result Value Ref Range   Lipase 35 22 - 51 U/L  I-stat troponin, ED  (not at [facility], [facility])  Result Value Ref Range   Troponin i, poc 0.00 0.00 - 0.08 ng/mL   Comment 3          POC occult blood, ED  Result Value Ref Range   Fecal Occult Bld POSITIVE (A) NEGATIVE  I-Stat CG4 Lactic Acid, ED  Result Value Ref Range   Lactic Acid, Venous 1.64 0.5 - 2.0 mmol/L  Type and screen  Result Value Ref Range   ABO/RH(D) AB POS    Antibody Screen NEG    Sample Expiration 10/19/2014   ABO/Rh  Result Value Ref Range   ABO/RH(D) AB POS     Labs Review Labs Reviewed  COMPREHENSIVE METABOLIC PANEL - Abnormal; Notable for the following:    Potassium 5.5 (*)    Glucose, Bld 113 (*)    BUN 37 (*)    Creatinine, Ser 1.63 (*)    Calcium 8.4 (*)    Total Protein 6.1 (*)    Albumin 2.5 (*)    AST 49 (*)    Total Bilirubin 1.6 (*)    GFR calc non Af Amer 40 (*)    GFR calc Af Amer 47 (*)    All other components within normal limits  CBC WITH DIFFERENTIAL/PLATELET - Abnormal; Notable for the following:    WBC 11.1 (*)    RBC 2.20 (*)    Hemoglobin 7.8 (*)    HCT 22.8 (*)    MCV 103.6 (*)    MCH 35.5 (*)    Platelets 107 (*)    All other components within normal limits  PROTIME-INR - Abnormal; Notable for the following:    Prothrombin Time 19.0 (*)    INR 1.59 (*)  All other components within normal limits  POC OCCULT BLOOD, ED - Abnormal; Notable for the following:    Fecal Occult Bld POSITIVE (*)    All other components within normal limits  TROPONIN I  APTT  LIPASE, BLOOD  I-STAT TROPOININ, ED  I-STAT CG4 LACTIC ACID, ED  TYPE AND SCREEN  ABO/RH    Imaging Review Ct Abdomen Pelvis Wo Contrast  10/16/2014   CLINICAL DATA:  Left lower chest pain. Back pain. Stomach pain. Rectal bleeding.  EXAM: CT ABDOMEN AND PELVIS WITHOUT CONTRAST  TECHNIQUE: Multidetector CT imaging of the abdomen and pelvis was performed following the standard  protocol without IV contrast.  COMPARISON:  None.  FINDINGS: The lack of intravenous contrast limits the ability to evaluate solid abdominal organs.  There is mild nodularity of the hepatic contour suggestive of cirrhosis. This finding is associated with minimal amount of intra-abdominal ascites. Note is made of an approximately 1.2 cm hypo attenuating (9 Hounsfield unit) lesion within the central aspect of the liver (image 17, series 2), incompletely characterized on this noncontrast examination. No radiopaque gallstones however there is mild diffuse apparent thickening of the gallbladder wall, nonspecific finding given gallbladder underdistention and minimal amount of intra-abdominal ascites.  Normal noncontrast appearance of the bilateral kidneys. No renal stones. No renal stones are seen along the expected course of either ureter or the urinary bladder. Normal noncontrast appearance of the urinary bladder given degree distention.  Normal noncontrast appearance of the bilateral adrenal glands and spleen. No evidence of splenomegaly. Incidental note is made of a small splenule. There is a minimal amount of fluid tracking within the root of the mesentery which is favored to be secondary to minimal amount of intra-abdominal ascites. Otherwise, normal noncontrast appearance of the pancreas.  The bowel is normal in course and caliber without wall thickening or evidence of obstruction. Normal noncontrast appearance of the appendix. No pneumoperitoneum, pneumatosis or portal venous gas.  Scattered atherosclerotic plaque within a normal caliber abdominal aorta.  Scattered shotty porta hepatis, retroperitoneal and mesenteric lymph nodes are numerous though individually not enlarged by size criteria with index gastrohepatic ligament lymph node measuring 1.3 cm in greatest short axis diameter (coronal image 81, series 5), aortocaval lymph node measuring 0.6 cm in greatest short axis diameter (image 34, series 2) and index  mesenteric lymph node measuring 0.5 cm in diameter (image 53, series 2).  Scattered calcifications within a normal sized prostate gland. There is a small amount of fluid seen within the pelvic cul-de-sac and within the bilateral inguinal canals.  Limited visualization of lower thorax is negative for focal airspace opacity or pleural effusion.  Normal heart size. There is diffuse decreased attenuation of the intra cardiac blood pool suggestive of anemia.  No acute or aggressive osseous abnormalities.  Avascular necrosis involving the bilateral femoral heads with associated mild-to-moderate bilateral hip degenerative change, articular surface collapse and irregularity, left greater than right.  Tiny left-sided mesenteric fat containing inguinal hernia. Regional soft tissues appear otherwise normal.  IMPRESSION: 1. Nodularity hepatic contour for suggestive of hepatic cirrhosis. This finding is associated with a small volume intra-abdominal ascites. 2. There is stranding throughout the root of the mesentery, presumably the sequela of small volume intra-abdominal ascites. Otherwise, normal noncontrast appearance of the pancreas. If there is clinical concern for acute pancreatitis, further evaluation with serum amylase and lipase levels is recommended. 3. Mild diffuse gallbladder wall thickening, presumably secondary to underdistention and small amount of intra-abdominal ascites, however if there is clinical  concern for acute cholecystitis, further evaluation could be performed with right upper quadrant abdominal ultrasound as indicated. 4. Punctate (approximately 1.2 cm) hypo attenuating lesion within the central aspect of the liver, incompletely characterized on this noncontrast examination, and while potentially representative of a hepatic cyst given suspected hepatic cirrhosis, further evaluation with nonemergent abdominal MRI could be performed as clinically indicated. 5. Sequela of avascular necrosis involving the  bilateral femoral heads with associated mild-to-moderate bilateral hip degenerative change and bilateral femoral head articular surface collapse and irregularity, left greater than right.   Electronically Signed   By: Sandi Mariscal M.D.   On: 10/16/2014 11:55   Dg Chest 2 View  10/16/2014   CLINICAL DATA:  Chest pain.  EXAM: CHEST  2 VIEW  COMPARISON:  August 05, 2003.  FINDINGS: The heart size and mediastinal contours are within normal limits. Both lungs are clear. No pneumothorax or pleural effusion is noted. The visualized skeletal structures are unremarkable.  IMPRESSION: No active cardiopulmonary disease.   Electronically Signed   By: Marijo Conception, M.D.   On: 10/16/2014 09:46     EKG Interpretation   Date/Time:  Thursday October 16 2014 08:49:53 EDT Ventricular Rate:  104 PR Interval:  171 QRS Duration: 76 QT Interval:  335 QTC Calculation: 441 R Axis:   67 Text Interpretation:  Sinus tachycardia Ventricular trigeminy Since  previous tracing PVCs are new Confirmed by Canary Brim  MD, MARTHA [phone]) on  10/16/2014 8:57:44 AM       11:06 AM This provider spoke with Wannetta Sender, Cardiology. Discussed case, labs, EKG in great detail. Patient to be consulted.   11:14 AM This provider spoke with Dr. Cristina Gong, GI. Discussed case, labs in great detail. GI to consult patient.   12:13 PM This provider spoke with Dr. Petra Kuba, Family medicine Resident. Discussed case in great detail. Patient to be admitted to [facility].    CRITICAL CARE Performed by: Jamse Mead   Total critical care time: 40  Critical care time was exclusive of separately billable procedures and treating other patients.  Critical care was necessary to treat or prevent imminent or life-threatening deterioration.  Critical care was time spent personally by me on the following activities: development of treatment plan with patient and/or surrogate as well as nursing, discussions with consultants, evaluation of patient's response to  treatment, examination of patient, obtaining history from patient or surrogate, ordering and performing treatments and interventions, ordering and review of laboratory studies, ordering and review of radiographic studies, pulse oximetry and re-evaluation of patient's condition.   MDM   Final diagnoses:  Abdominal pain  Upper GI bleed  Chest pain, unspecified chest pain type  Hyperkalemia    Medications  sodium chloride 0.9 % bolus 1,000 mL (not administered)  sodium chloride 0.9 % bolus 1,000 mL (0 mLs Intravenous Stopped 10/16/14 1047)  pantoprazole (PROTONIX) injection 40 mg (40 mg Intravenous Given 10/16/14 1023)    Filed Vitals:   10/16/14 1015 10/16/14 1030 10/16/14 1045 10/16/14 1145  BP: 112/49 130/59 116/43 104/45  Pulse: 84 78 93 95  Temp:      TempSrc:      Resp: 22 19 26 19   Height:      Weight:      SpO2: 100%  100% 100%   EKG noted sinus tachycardia with a heart rate 104 bpm with trigeminy PVCs that is a new finding. Troponin negative elevation. CBC noted elevated white blood cell count of 11.1. Hemoglobin 7.8 when compared to one year  ago patient's hemoglobin was 7.1. CMP noted hyperkalemia 5.5, negative findings of T-wave changes noted on EKG. Elevated BUN of 37, creatinine 1.63 - when compared to 8 months ago patient's creatinine was 1.07. Lactic acid negative elevation. Lipase negative elevation. Fecal occult positive. Chest xray negative for acute cardiopulmonary disease. CT abdomen and pelvis with contrast noted  Hepatic cirrhosis associated with a small volume intra-abdominal ascites. Stranding throughout the root of the mesentery presumably sequelae of the ascites. Mild diffuse gallbladder wall thickening presently secondary to under distention, small amount of intra-abdominal ascites. Punctate hypoattenuating lesion within the central aspect of the liver. Avascular necrosis involving the bilateral femoral heads. Patient presenting to the ED with chest pain which  changes to EKG, PVCs and trigeminy with negative elevated troponin. Cardiology to consult patients. Patient presenting to the ED with abdominal pain with identified ascites and cirrhosis with possible beginnings of gallbladder issue-right upper quadrant ultrasound ordered. Patient presenting with upper GI bleed, patient started on IV fluids, IV protonic-GI consulted and to assess patient. Patient to be admitted to stepdown under the care family medicine. Discussed plan for admission with patient who is in accordance to plan of care.  Jamse Mead, PA-C 10/16/14 [place], MD 10/16/14 1226

## 2014-10-16 NOTE — Consult Note (Signed)
Referring Provider: [facility] (Dr. McDiarmid) Primary Care Physician:  Diona Fanti, DO Primary Gastroenterologist:  None (unassigned; had screening colonoscopy by Dr. Sharlett Iles 6 yrs ago)  Reason for Consultation:  GI bleed  HPI: Donald Moreno is a 74 y.o. male with no prior history of GI bleeding been admitted to the hospital today because of multiple episodes of bloody bowel movements, both dark and red in color, which began this morning and were associated with some slight lightheadedness but no frank syncope. No prodromal dyspeptic symptomatology, no associated abdominal pain or significant cramps. Patient is on daily 81 mg aspirin plus Mobitz for polyarthralgias. Is not on any PPI therapy.  Colonoscopy 6 years ago did show left-sided diverticulosis and a small adenomatous polyp, which has not been followed up on.  In the emergency room, hemoglobin is 7.8 as compared to 13 a couple of years ago, MCV is elevated at 104, platelets are down at 107,000, BUN is up at 37.  The patient admits to drinking "several beers" a day, having previously consumed other types of alcohol somewhat more heavily. In that regard, the patient has an elevated AST of 49, ALT 30, bilirubin 1.6, platelets low, as mentioned, at 107,000 whereas they were normal 2 years ago.   The patient is a reasonably sound overall medical health, doing a fair amount of yard work, driving a car, up and around all day.   Past Medical History  Diagnosis Date  . Arthritis   . Gout   . Asthma   . Hypertension   . ETOH abuse     History reviewed. No pertinent past surgical history.  Prior to Admission medications   Medication Sig Start Date End Date Taking? Authorizing Provider  albuterol (PROAIR HFA) 108 (90 BASE) MCG/ACT inhaler take 2 puffs every 6 hours if needed for wheezing 07/05/13  Yes Kinnie Feil, MD  allopurinol (ZYLOPRIM) 100 MG tablet Take 1 tablet (100 mg total) by mouth daily. Add to  300mg  tablet for combined dose of 400mg . 07/30/14  Yes Katheren Shams, DO  allopurinol (ZYLOPRIM) 300 MG tablet Take 1 tablet (300 mg total) by mouth daily. 07/30/14  Yes Katheren Shams, DO  aspirin 81 MG tablet Take 81 mg by mouth daily.   Yes Historical Provider, MD  Fluticasone-Salmeterol (ADVAIR DISKUS) 500-50 MCG/DOSE AEPB Inhale 1 puff INTO THE LUNGS twice a day 07/24/14  Yes Katheren Shams, DO  lisinopril (PRINIVIL,ZESTRIL) 40 MG tablet Take 1 tablet (40 mg total) by mouth daily. 07/29/14  Yes Katheren Shams, DO  meloxicam (MOBIC) 15 MG tablet Take 1 tablet (15 mg total) by mouth daily as needed for pain. 07/29/14  Yes Katheren Shams, DO  montelukast (SINGULAIR) 10 MG tablet Take 1 tablet (10 mg total) by mouth at bedtime. 07/29/14  Yes Katheren Shams, DO  colchicine (COLCRYS) 0.6 MG tablet Take 2 tablets(1.2mg  total) at first sign of flare. One hour later take 1 tablet (0.6mg ). Patient taking differently: Take 0.6-1.2 mg by mouth as needed (gout flare). Take 2 tablets(1.2mg  total) at first sign of flare. One hour later take 1 tablet (0.6mg ). 07/30/14   Katheren Shams, DO    No current facility-administered medications for this encounter.   Current Outpatient Prescriptions  Medication Sig Dispense Refill  . albuterol (PROAIR HFA) 108 (90 BASE) MCG/ACT inhaler take 2 puffs every 6 hours if needed for wheezing 8.5 g 5  . allopurinol (ZYLOPRIM) 100 MG tablet Take 1 tablet (100  mg total) by mouth daily. Add to 300mg  tablet for combined dose of 400mg . 60 tablet 3  . allopurinol (ZYLOPRIM) 300 MG tablet Take 1 tablet (300 mg total) by mouth daily. 60 tablet 3  . aspirin 81 MG tablet Take 81 mg by mouth daily.    . Fluticasone-Salmeterol (ADVAIR DISKUS) 500-50 MCG/DOSE AEPB Inhale 1 puff INTO THE LUNGS twice a day 60 each 6  . lisinopril (PRINIVIL,ZESTRIL) 40 MG tablet Take 1 tablet (40 mg total) by mouth daily. 90 tablet 3  . meloxicam (MOBIC) 15 MG tablet Take 1 tablet (15 mg total) by mouth daily as  needed for pain. 30 tablet 5  . montelukast (SINGULAIR) 10 MG tablet Take 1 tablet (10 mg total) by mouth at bedtime. 90 tablet 6  . colchicine (COLCRYS) 0.6 MG tablet Take 2 tablets(1.2mg  total) at first sign of flare. One hour later take 1 tablet (0.6mg ). (Patient taking differently: Take 0.6-1.2 mg by mouth as needed (gout flare). Take 2 tablets(1.2mg  total) at first sign of flare. One hour later take 1 tablet (0.6mg ).) 60 tablet 3    Allergies as of 10/16/2014  . (No Known Allergies)    Family History  Problem Relation Age of Onset  . Asthma Sister   . Heart disease Sister   . Asthma Brother     History   Social History  . Marital Status: Legally Separated    Spouse Name: N/A  . Number of Children: 5  . Years of Education: 10   Occupational History  . Retired- Administrator    Social History Main Topics  . Smoking status: Never Smoker   . Smokeless tobacco: Current User  . Alcohol Use: 1.2 oz/week    2 Cans of beer per week  . Drug Use: No  . Sexual Activity: Not Currently   Other Topics Concern  . Not on file   Social History Narrative   Lives with his sister Stanton Kidney and nephew in [place] home.     Alcohol: 2 cans of beer daily    Tobacco: snuff all day (now 1/3 can per day). Never smoked.    Exercise: walks a to store a few times a week.       Other: dentures, walker (occasionally)      Will be visiting his son in [place] for his graduation in July 2012.          Hobbies:  TV, drink beer, visit with friends    Pets:  Dog, Bean    Review of Systems: Negative for chest pain, shortness of breath, urinary difficulties, or skin problems. As mentioned, no prior history of GI bleeding. No problems with chronic reflux. Multiple arthralgias   Physical Exam: Vital signs in last 24 hours: Temp:  [98.1 F (36.7 C)] 98.1 F (36.7 C) (06/02 0855) Pulse Rate:  [78-112] 112 (06/02 1300) Resp:  [16-32] 20 (06/02 1300) BP: (97-139)/(43-73) 139/65 mmHg (06/02  1300) SpO2:  [100 %] 100 % (06/02 1300) Weight:  [74.844 kg (165 lb)] 74.844 kg (165 lb) (06/02 0855)   General:   Alert,  Well-developed, well-nourished, pleasant and cooperative in NAD Head:  Normocephalic and atraumatic. Eyes:  Sclera clear, no icterus.   Conjunctiva pink. Mouth:   No ulcerations or lesions.  Oropharynx pink & moist. Neck:   No masses or thyromegaly. Lungs: The patient has some scattered soft wheezes.Marland Kitchen Heart:   Regular rate and rhythm; no murmurs, clicks, rubs,  or gallops. Abdomen:  Soft, nontender, nontympanitic, and  nondistended. No masses, hepatosplenomegaly or ventral hernias noted. Normal bowel sounds, without bruits, guarding, or rebound.   Rectal:  Grossly bloody stool, both dark and red, per ER MD   Msk:   Symmetrical without gross deformities. Pulses:  Normal radial pulse is noted. Extremities:   Without clubbing, cyanosis, or edema. Neurologic:  Alert and coherent;  grossly normal neurologically. Skin:  Intact without significant lesions or rashes. Cervical Nodes:  No significant cervical adenopathy. Psych:   Alert and cooperative. Normal mood and affect.  Intake/Output from previous day:   Intake/Output this shift: Total I/O In: 1000 [I.V.:1000] Out: -   Lab Results:  Recent Labs  10/16/14 0915  WBC 11.1*  HGB 7.8*  HCT 22.8*  PLT 107*   BMET  Recent Labs  10/16/14 0915  NA 137  K 5.5*  CL 107  CO2 23  GLUCOSE 113*  BUN 37*  CREATININE 1.63*  CALCIUM 8.4*   LFT  Recent Labs  10/16/14 0915  PROT 6.1*  ALBUMIN 2.5*  AST 49*  ALT 30  ALKPHOS 74  BILITOT 1.6*   PT/INR  Recent Labs  10/16/14 0915  LABPROT 19.0*  INR 1.59*    Studies/Results: Ct Abdomen Pelvis Wo Contrast  10/16/2014   CLINICAL DATA:  Left lower chest pain. Back pain. Stomach pain. Rectal bleeding.  EXAM: CT ABDOMEN AND PELVIS WITHOUT CONTRAST  TECHNIQUE: Multidetector CT imaging of the abdomen and pelvis was performed following the standard protocol  without IV contrast.  COMPARISON:  None.  FINDINGS: The lack of intravenous contrast limits the ability to evaluate solid abdominal organs.  There is mild nodularity of the hepatic contour suggestive of cirrhosis. This finding is associated with minimal amount of intra-abdominal ascites. Note is made of an approximately 1.2 cm hypo attenuating (9 Hounsfield unit) lesion within the central aspect of the liver (image 17, series 2), incompletely characterized on this noncontrast examination. No radiopaque gallstones however there is mild diffuse apparent thickening of the gallbladder wall, nonspecific finding given gallbladder underdistention and minimal amount of intra-abdominal ascites.  Normal noncontrast appearance of the bilateral kidneys. No renal stones. No renal stones are seen along the expected course of either ureter or the urinary bladder. Normal noncontrast appearance of the urinary bladder given degree distention.  Normal noncontrast appearance of the bilateral adrenal glands and spleen. No evidence of splenomegaly. Incidental note is made of a small splenule. There is a minimal amount of fluid tracking within the root of the mesentery which is favored to be secondary to minimal amount of intra-abdominal ascites. Otherwise, normal noncontrast appearance of the pancreas.  The bowel is normal in course and caliber without wall thickening or evidence of obstruction. Normal noncontrast appearance of the appendix. No pneumoperitoneum, pneumatosis or portal venous gas.  Scattered atherosclerotic plaque within a normal caliber abdominal aorta.  Scattered shotty porta hepatis, retroperitoneal and mesenteric lymph nodes are numerous though individually not enlarged by size criteria with index gastrohepatic ligament lymph node measuring 1.3 cm in greatest short axis diameter (coronal image 81, series 5), aortocaval lymph node measuring 0.6 cm in greatest short axis diameter (image 34, series 2) and index mesenteric  lymph node measuring 0.5 cm in diameter (image 53, series 2).  Scattered calcifications within a normal sized prostate gland. There is a small amount of fluid seen within the pelvic cul-de-sac and within the bilateral inguinal canals.  Limited visualization of lower thorax is negative for focal airspace opacity or pleural effusion.  Normal heart size. There  is diffuse decreased attenuation of the intra cardiac blood pool suggestive of anemia.  No acute or aggressive osseous abnormalities.  Avascular necrosis involving the bilateral femoral heads with associated mild-to-moderate bilateral hip degenerative change, articular surface collapse and irregularity, left greater than right.  Tiny left-sided mesenteric fat containing inguinal hernia. Regional soft tissues appear otherwise normal.  IMPRESSION: 1. Nodularity hepatic contour for suggestive of hepatic cirrhosis. This finding is associated with a small volume intra-abdominal ascites. 2. There is stranding throughout the root of the mesentery, presumably the sequela of small volume intra-abdominal ascites. Otherwise, normal noncontrast appearance of the pancreas. If there is clinical concern for acute pancreatitis, further evaluation with serum amylase and lipase levels is recommended. 3. Mild diffuse gallbladder wall thickening, presumably secondary to underdistention and small amount of intra-abdominal ascites, however if there is clinical concern for acute cholecystitis, further evaluation could be performed with right upper quadrant abdominal ultrasound as indicated. 4. Punctate (approximately 1.2 cm) hypo attenuating lesion within the central aspect of the liver, incompletely characterized on this noncontrast examination, and while potentially representative of a hepatic cyst given suspected hepatic cirrhosis, further evaluation with nonemergent abdominal MRI could be performed as clinically indicated. 5. Sequela of avascular necrosis involving the bilateral  femoral heads with associated mild-to-moderate bilateral hip degenerative change and bilateral femoral head articular surface collapse and irregularity, left greater than right.   Electronically Signed   By: Sandi Mariscal M.D.   On: 10/16/2014 11:55   Dg Chest 2 View  10/16/2014   CLINICAL DATA:  Chest pain.  EXAM: CHEST  2 VIEW  COMPARISON:  August 05, 2003.  FINDINGS: The heart size and mediastinal contours are within normal limits. Both lungs are clear. No pneumothorax or pleural effusion is noted. The visualized skeletal structures are unremarkable.  IMPRESSION: No active cardiopulmonary disease.   Electronically Signed   By: Marijo Conception, M.D.   On: 10/16/2014 09:46   US Abdomen Limited Ruq  10/16/2014   CLINICAL DATA:  74 year old male with abnormal liver and gallbladder and small volume ascites on noncontrast CT Abdomen and Pelvis. Initial encounter.  EXAM: US ABDOMEN LIMITED - RIGHT UPPER QUADRANT  COMPARISON:  CT Abdomen and Pelvis 1125 hours today.  FINDINGS: Gallbladder:  Decompressed with thickening of the gallbladder wall up to 5-6 mm. No sonographic Murphy sign elicited. No sludge or stones identified within the gallbladder lumen.  Common bile duct:  Diameter: 3 mm, normal  Liver:  Nodular liver contour re- identified. No intrahepatic biliary ductal dilatation. Simple appearing cyst in the central liver measuring 17 mm and corresponding to the low-density finding on the CT earlier today.  Other findings: Small volume perihepatic ascites is less apparent (image 34). Negative visualized right kidney.  IMPRESSION: 1. Cirrhotic liver. The small central hypodense liver lesions seen on the earlier CT is a benign cyst. 2. Gallbladder wall thickening without stones or sonographic Murphy sign most likely is reactive / sequelae of chronic liver disease.   Electronically Signed   By: Genevie Ann M.D.   On: 10/16/2014 13:21    Impression: 1. Acute GI bleeding of indeterminate origin. I favor the upper tract  because of the dark character of the stool and the elevated BUN, plus the risk factors for ulcer disease with aspirin and NSAID exposure. No significant hemodynamic instability. 2. Posthemorrhagic anemia, acute, moderate 3. Evidence for chronic liver disease with nodular liver contour on CT, mild elevation of liver chemistries, thrombocytopenia 4. Left-sided diverticulosis and small adenomatous  polyp on colonoscopy 6 years ago   Plan: Proceed to endoscopic evaluation this afternoon. Petra Kuba, purpose, and risks reviewed and patient agreeable. Further management to depend on endoscopic findings.  Consider elective colonoscopy, either later during this admission or as an outpatient, to follow up on the small adenomatous polyp removed 6 years ago.  The patient will need education about the fact he is showing signs of liver disease, and the need for complete ethanol abstinence for optimal longevity.      LOS: 0 days   Shauna Bodkins,Kaylum V  10/16/2014, 1:30 PM   Pager [phone] If no answer or after 5 PM call [phone]

## 2014-10-16 NOTE — Consult Note (Signed)
Patient ID: Donald Moreno MRN: [id number], DOB/AGE: 10/04/1940   Admit date: 10/16/2014  Primary Physician: Diona Fanti, DO Primary Cardiologist: new - seen by C. Angelena Form, MD   Pt. Profile:  74 year old male with PMH significant for arthritis, gout, asthma, HTN and ETOH abuse presenting with melena, fatigue, abdominal pain, and atypical chest pain.   Problem List  Past Medical History  Diagnosis Date  . Arthritis   . Gout   . Asthma   . Hypertension   . ETOH abuse     History reviewed. No pertinent past surgical history.   Allergies  No Known Allergies  HPI  Mr. Tray is a 74 year old male with a PMH significant per above. He states that he was in his usual state of health up until this morning. He had a black, tarry stool at 0300 this morning and had 2 additional BMs later in the morning which were tarry and with trace amounts of bright red blood. While on the commode, he felt light-headed, fatigued, and had generalized abdominal pain with some dull, atypical chest pain. At no point this morning did he feel short of breath, anxious, diaphoretic, or nauseous. He phoned a friend and told him about how weak he felt, and his friend brought him to the ED.   He states that he's experienced nonspecific abdominal pain for several years but has never sought care for this. Nothing in particular seems to make the pain better or worse; it just comes and goes from time to time with no identifiable pattern. Of note, he reports drinking 3 beers every day and has a distant history of severe, protracted alcohol abuse. Lately, over the past 5 years or so, he only drinks beer. He also loves spicy foods and eats hot peppers on a daily basis. However, he has never experienced burning or irritation in his throat or esophagus. No history of illicit drug use or unprotected sex.   Lastly, he is very active and keeps busy by working in the yard and chopping down trees. He says working in the yard  and staying busy helps with the abdominal pain. Most times he will work through the pain, but says this time he was frightened by the blood in his stool.   Home Medications  Prior to Admission medications   Medication Sig Start Date End Date Taking? Authorizing Provider  albuterol (PROAIR HFA) 108 (90 BASE) MCG/ACT inhaler take 2 puffs every 6 hours if needed for wheezing 07/05/13  Yes Kinnie Feil, MD  allopurinol (ZYLOPRIM) 100 MG tablet Take 1 tablet (100 mg total) by mouth daily. Add to 300mg  tablet for combined dose of 400mg . 07/30/14  Yes Katheren Shams, DO  allopurinol (ZYLOPRIM) 300 MG tablet Take 1 tablet (300 mg total) by mouth daily. 07/30/14  Yes Katheren Shams, DO  aspirin 81 MG tablet Take 81 mg by mouth daily.   Yes Historical Provider, MD  Fluticasone-Salmeterol (ADVAIR DISKUS) 500-50 MCG/DOSE AEPB Inhale 1 puff INTO THE LUNGS twice a day 07/24/14  Yes Katheren Shams, DO  lisinopril (PRINIVIL,ZESTRIL) 40 MG tablet Take 1 tablet (40 mg total) by mouth daily. 07/29/14  Yes Katheren Shams, DO  meloxicam (MOBIC) 15 MG tablet Take 1 tablet (15 mg total) by mouth daily as needed for pain. 07/29/14  Yes Katheren Shams, DO  montelukast (SINGULAIR) 10 MG tablet Take 1 tablet (10 mg total) by mouth at bedtime. 07/29/14  Yes Katheren Shams,  DO  colchicine (COLCRYS) 0.6 MG tablet Take 2 tablets(1.2mg  total) at first sign of flare. One hour later take 1 tablet (0.6mg ). Patient taking differently: Take 0.6-1.2 mg by mouth as needed (gout flare). Take 2 tablets(1.2mg  total) at first sign of flare. One hour later take 1 tablet (0.6mg ). 07/30/14   Katheren Shams, DO    Family History  Family History  Problem Relation Age of Onset  . Asthma Sister   . Heart disease Sister     multiple stents.  . Asthma Brother   . Other Mother     died when pt was only 62 - unknown cause.  . Other Father     deceased.    Social History  History   Social History  . Marital Status: Legally Separated     Spouse Name: N/A  . Number of Children: 5  . Years of Education: 10   Occupational History  . Retired- Administrator    Social History Main Topics  . Smoking status: Never Smoker   . Smokeless tobacco: Current User  . Alcohol Use: 1.2 oz/week    2 Cans of beer per week  . Drug Use: No  . Sexual Activity: Not Currently   Other Topics Concern  . Not on file   Social History Narrative   Lives with his sister Stanton Kidney and nephew in [place] home.     Alcohol: 2 cans of beer daily    Tobacco: snuff all day (now 1/3 can per day). Never smoked.    Exercise: walks a to store a few times a week.       Other: dentures, walker (occasionally)      Will be visiting his son in [place] for his graduation in July 2012.          Hobbies:  TV, drink beer, visit with friends    Pets:  Dog, Bean     Review of Systems General:  No chills, fever, night sweats or weight changes.  Cardiovascular:  Positive for chest pain. No dyspnea on exertion, edema, orthopnea, palpitations, paroxysmal nocturnal dyspnea. Dermatological: No rash, lesions/masses Respiratory: No cough, dyspnea Urologic: No hematuria, dysuria Abdominal:   No nausea or vomiting. No diarrhea. Positive for abdominal pain, melena, and hematochezia.  Neurologic:  No visual changes, wkns, changes in mental status. All other systems reviewed and are otherwise negative except as noted above.  Physical Exam  Blood pressure 115/65, pulse 98, temperature 98.1 F (36.7 C), temperature source Oral, resp. rate 23, height 5\' 10"  (1.778 m), weight 165 lb (74.844 kg), SpO2 100 %.  General: Pleasant, alert, interactive, NAD Psych: Normal affect. Neuro: Alert and oriented X 3. Moves all extremities spontaneously. HEENT: Sclera icteric, poor dentition(toothless)  Neck: Supple without bruits or JVD. Lungs:  Wheezes auscultated in RUL, RML, LUL. Good air movement.  Heart: RRR no s3, s4, or murmurs. Abdomen: Soft, tender to light and deep  palpation predominantly around the umbilicus, mildly distended. Trace ascities. BS + x4.  Extremities: No clubbing, cyanosis or edema. DP/PT/Radials 2+ and equal bilaterally.  Labs  Troponin [facility] Care Test)  Recent Labs  10/16/14 0920  TROPIPOC 0.00    Recent Labs  10/16/14 0915  TROPONINI <0.03   Lab Results  Component Value Date   WBC 11.1* 10/16/2014   HGB 7.8* 10/16/2014   HCT 22.8* 10/16/2014   MCV 103.6* 10/16/2014   PLT 107* 10/16/2014     Recent Labs Lab 10/16/14 0915  NA 137  K 5.5*  CL 107  CO2 23  BUN 37*  CREATININE 1.63*  CALCIUM 8.4*  PROT 6.1*  BILITOT 1.6*  ALKPHOS 74  ALT 30  AST 49*  GLUCOSE 113*   Radiology/Studies  Ct Abdomen Pelvis Wo Contrast  10/16/2014   CLINICAL DATA:  Left lower chest pain. Back pain. Stomach pain. Rectal bleeding.  EXAM: CT ABDOMEN AND PELVIS WITHOUT CONTRAST  IMPRESSION: 1. Nodularity hepatic contour for suggestive of hepatic cirrhosis. This finding is associated with a small volume intra-abdominal ascites. 2. There is stranding throughout the root of the mesentery, presumably the sequela of small volume intra-abdominal ascites. Otherwise, normal noncontrast appearance of the pancreas. If there is clinical concern for acute pancreatitis, further evaluation with serum amylase and lipase levels is recommended. 3. Mild diffuse gallbladder wall thickening, presumably secondary to underdistention and small amount of intra-abdominal ascites, however if there is clinical concern for acute cholecystitis, further evaluation could be performed with right upper quadrant abdominal ultrasound as indicated. 4. Punctate (approximately 1.2 cm) hypo attenuating lesion within the central aspect of the liver, incompletely characterized on this noncontrast examination, and while potentially representative of a hepatic cyst given suspected hepatic cirrhosis, further evaluation with nonemergent abdominal MRI could be performed as clinically  indicated. 5. Sequela of avascular necrosis involving the bilateral femoral heads with associated mild-to-moderate bilateral hip degenerative change and bilateral femoral head articular surface collapse and irregularity, left greater than right.   Electronically Signed   By: Sandi Mariscal M.D.   On: 10/16/2014 11:55   Dg Chest 2 View  10/16/2014   CLINICAL DATA:  Chest pain.  EXAM: CHEST  2 VIEW  COMPARISON:  August 05, 2003.  FINDINGS: The heart size and mediastinal contours are within normal limits. Both lungs are clear. No pneumothorax or pleural effusion is noted. The visualized skeletal structures are unremarkable.  IMPRESSION: No active cardiopulmonary disease.   Electronically Signed   By: Marijo Conception, M.D.   On: 10/16/2014 09:46   US Abdomen Limited Ruq  10/16/2014   CLINICAL DATA:  74 year old male with abnormal liver and gallbladder and small volume ascites on noncontrast CT Abdomen and Pelvis. Initial encounter.  EXAM: US ABDOMEN LIMITED - RIGHT UPPER QUADRANT   IMPRESSION: 1. Cirrhotic liver. The small central hypodense liver lesions seen on the earlier CT is a benign cyst. 2. Gallbladder wall thickening without stones or sonographic Murphy sign most likely is reactive / sequelae of chronic liver disease.   Electronically Signed   By: Genevie Ann M.D.   On: 10/16/2014 13:21   ECG  Sinus tachycardia, rate 104, poor R wave progression, frequent PVCs, no ischemic changes.   ASSESSMENT AND PLAN  1. Atypical chest pain: ECG without ischemic changes and initial troponin negative. Low suspicion for ACS, however, patient has several risk factors for CAD. In setting of melena/GIB, suspect chest and abdominal pain is GI in nature - currently pending EGD in ED. -Trend cardiac enzymes q6 hours x2 -Replace volume and correct electrolytes per internal medicine -Discontinue ASA for now due to active GIB -Consider nuclear stress test as an outpt for risk stratification.   2. GIB: management per GI.  EGD  pending today.  3. HTN: currently hypotensive due to hypovolemia. Consider restarting home lisinopril 40 mg following volume resuscitation if stable.  4. Transaminitis and coagulopathy: abdominal CT showing hepatic cirrhosis with small volume intra-abdominal ascites. Likely related to chronic alcohol abuse and predisposing GIB. Per GI/IM.  5. Alcohol and tobacco  abuse: discussed tobacco and alcohol cessation and the patient is interested in quitting.  6.  AKI:  In setting of hypovolemia/GIB.  IVF running though R AC IV currently infiltrated.  Hold home dose of ACEI.  7.  Asthma:  On inhalers @ home.  Signed, Murray Hodgkins, NP 10/16/2014, 1:52 PM  I have personally seen and examined this patient with Ignacia Bayley, NP. I agree with the assessment and plan as outlined above. He is being admitted with abdominal pain and melena and an urgent EGD is being performed in the ED. It does not seem that there are any active cardiac issues. Agree with cycling troponin for atypical chest pain. He has risk factors for CAD but no ischemic workup recommended at this time.   MCALHANY,CHRISTOPHER 10/16/2014 2:03 PM

## 2014-10-17 DIAGNOSIS — D62 Acute posthemorrhagic anemia: Secondary | ICD-10-CM | POA: Diagnosis present

## 2014-10-17 DIAGNOSIS — K921 Melena: Secondary | ICD-10-CM

## 2014-10-17 LAB — CBC
HCT: 20.3 % — ABNORMAL LOW (ref 39.0–52.0)
HEMATOCRIT: 21.2 % — AB (ref 39.0–52.0)
HEMOGLOBIN: 7.2 g/dL — AB (ref 13.0–17.0)
Hemoglobin: 7 g/dL — ABNORMAL LOW (ref 13.0–17.0)
MCH: 34.1 pg — AB (ref 26.0–34.0)
MCH: 34.4 pg — AB (ref 26.0–34.0)
MCHC: 34.5 g/dL (ref 30.0–36.0)
MCHC: 34 g/dL (ref 30.0–36.0)
MCV: 99 fL (ref 78.0–100.0)
MCV: 101.4 fL — AB (ref 78.0–100.0)
Platelets: 85 10*3/uL — ABNORMAL LOW (ref 150–400)
Platelets: 82 10*3/uL — ABNORMAL LOW (ref 150–400)
RBC: 2.05 MIL/uL — ABNORMAL LOW (ref 4.22–5.81)
RBC: 2.09 MIL/uL — ABNORMAL LOW (ref 4.22–5.81)
RDW: 16.7 % — ABNORMAL HIGH (ref 11.5–15.5)
RDW: 17.9 % — ABNORMAL HIGH (ref 11.5–15.5)
WBC: 9 10*3/uL (ref 4.0–10.5)
WBC: 8.7 10*3/uL (ref 4.0–10.5)

## 2014-10-17 LAB — COMPREHENSIVE METABOLIC PANEL
ALT: 24 U/L (ref 17–63)
AST: 33 U/L (ref 15–41)
Albumin: 2.2 g/dL — ABNORMAL LOW (ref 3.5–5.0)
Alkaline Phosphatase: 50 U/L (ref 38–126)
Anion gap: 5 (ref 5–15)
BUN: 35 mg/dL — ABNORMAL HIGH (ref 6–20)
CO2: 21 mmol/L — ABNORMAL LOW (ref 22–32)
Calcium: 8 mg/dL — ABNORMAL LOW (ref 8.9–10.3)
Chloride: 114 mmol/L — ABNORMAL HIGH (ref 101–111)
Creatinine, Ser: 1.25 mg/dL — ABNORMAL HIGH (ref 0.61–1.24)
GFR calc non Af Amer: 56 mL/min — ABNORMAL LOW (ref >60)
GLUCOSE: 94 mg/dL (ref 65–99)
POTASSIUM: 3.9 mmol/L (ref 3.5–5.1)
Sodium: 140 mmol/L (ref 135–145)
Total Bilirubin: 1.9 mg/dL — ABNORMAL HIGH (ref 0.3–1.2)
Total Protein: 5.5 g/dL — ABNORMAL LOW (ref 6.5–8.1)

## 2014-10-17 LAB — TROPONIN I
Troponin I: <0.03 ng/mL (ref <0.031)
Troponin I: <0.03 ng/mL (ref <0.031)

## 2014-10-17 MED ORDER — PHYTONADIONE 5 MG PO TABS
5.0000 mg | ORAL_TABLET | Freq: Every day | ORAL | Status: AC
  Administered 2014-10-17 – 2014-10-19 (×3): 5 mg via ORAL
  Filled 2014-10-17 (×3): qty 1

## 2014-10-17 NOTE — Progress Notes (Signed)
Family Medicine Teaching Service Daily Progress Note Intern Pager: [phone]  [name] Medical record number: [id number] Date of birth: 1941/04/18 Age: 74 y.o. Gender: male  Primary Care Provider: Luiz Blare, DO Consultants: GI, Cardiology Code Status: Full code  Pt Overview and Major Events to Date:  6/2 - admit for melena, hematochezia, atypical chest pain  Assessment and Plan: Donald Moreno is a 74 y.o. male presenting with melena, hematochezia, atypical chest pain. PMH significant for arthritis, gout, asthma, HTN and ETOH abuse presenting with melena, fatigue, abdominal pain, and atypical chest pain.   Melena/Hematochezia: FOBT+, abd u/s with cirrhosis and a benign hepatic cyst, gallbladder wall thickening w/out Murphy's sign. CT abd with hepatic cirrhosis, ascites, gallbladder wall thickening, sequela of AVN on b/l femoral head w/ mild-mod degenerative changes. Hgb 7.8, Plt 107, LA 1.64, K 5.5, 1.63, Bili 1.6 on admission -c/s GI, appreciate recs: -EGD 6/2 unsuccessful 2/2 combativeness under sedation -Continue IV PPI, switch to oral 6/4  - if continued melena or persistent drop in hemoglobin, EGD under propofol -Will need outpatient colonoscopy - Continue to monitor Hgb: 7.8 >> 6.3 (1u pRBC given) >> 7.0  - next check at 3pm  Atypical chest pain: EKG with PVCs and trigeminy. Trop neg x1. LA normal. Cardiology reports low suspicion for ACS. -c/s cardiology, appreciate recs. - no further ischemic work up at this time. Consider nuclear stress test outpatient - Continue to monitor on telemetry - troponins neg x3, repeat EKG nonischemic -ASA held in setting of GI bleed  AKI: Cr 1.63 on admission >> 1.25 this AM (1.07 in 01/2014). Likely related to volume loss in setting of GI bleed. -s/p 2L IVF - Continue to monitor  ETOH abuse: AST 49, ALT 30, bilirubin 1.6, platelets 107 (normal 2 years ago) -CIWA protocol  HTN:  hypotensive in ED to 70/40's. Now normotensive -Hold home Lisinopril for now - Can consider restarting if BPs elevated consistently  Gout: on Allopurinol and Colcrys at home -Continue allopurinol  FEN/GI: Reg diet, Protonix infusion Prophylaxis: SCDs in setting of GI bleed  Disposition: SDU for close monitoring.  D/c pending GI, cardiology recs, and stabilization of hemoglobin.  Subjective:  Feeling well, no further bleeding, those RN reports small melenotic stool.  Objective: Temp:  [98.5 F (36.9 C)-100.8 F (38.2 C)] 98.6 F (37 C) (06/03 1214) Pulse Rate:  [68-103] 82 (06/03 1104) Resp:  [14-25] 25 (06/03 1104) BP: (74-140)/(42-71) 137/71 mmHg (06/03 1104) SpO2:  [100 %] 100 % (06/03 1104) Weight:  [161 lb 9.6 oz (73.3 kg)] 161 lb 9.6 oz (73.3 kg) (06/02 1807) Physical Exam: General: NAD, resting comfortably in bed Cardiovascular: RRR, soft blowing murmur at apex Respiratory: CTAB, normal WOB, no w/r/c Abdomen: Soft, NDNT, +BS, no rebound/guarding Extremities: WWP, no edema  Laboratory:  Recent Labs Lab 10/16/14 0915 10/16/14 1932 10/17/14 0419  WBC 11.1* 11.5* 9.0  HGB 7.8* 6.3* 7.0*  HCT 22.8* 17.7* 20.3*  PLT 107* 86* 85*    Recent Labs Lab 10/16/14 0915 10/17/14 0419  NA 137 140  K 5.5* 3.9  CL 107 114*  CO2 23 21*  BUN 37* 35*  CREATININE 1.63* 1.25*  CALCIUM 8.4* 8.0*  PROT 6.1* 5.5*  BILITOT 1.6* 1.9*  ALKPHOS 74 50  ALT 30 24  AST 49* 33  GLUCOSE 113* 94    Troponin neg x3  EKG: NSR, PVCs  Imaging/Diagnostic Tests: Ct Abdomen Pelvis Wo Contrast  10/16/2014   CLINICAL DATA:  Left lower chest pain. Back  pain. Stomach pain. Rectal bleeding.  EXAM: CT ABDOMEN AND PELVIS WITHOUT CONTRAST  TECHNIQUE: Multidetector CT imaging of the abdomen and pelvis was performed following the standard protocol without IV contrast.  COMPARISON:  None.  FINDINGS: The lack of intravenous contrast limits the ability to evaluate solid abdominal organs.  There is  mild nodularity of the hepatic contour suggestive of cirrhosis. This finding is associated with minimal amount of intra-abdominal ascites. Note is made of an approximately 1.2 cm hypo attenuating (9 Hounsfield unit) lesion within the central aspect of the liver (image 17, series 2), incompletely characterized on this noncontrast examination. No radiopaque gallstones however there is mild diffuse apparent thickening of the gallbladder wall, nonspecific finding given gallbladder underdistention and minimal amount of intra-abdominal ascites.  Normal noncontrast appearance of the bilateral kidneys. No renal stones. No renal stones are seen along the expected course of either ureter or the urinary bladder. Normal noncontrast appearance of the urinary bladder given degree distention.  Normal noncontrast appearance of the bilateral adrenal glands and spleen. No evidence of splenomegaly. Incidental note is made of a small splenule. There is a minimal amount of fluid tracking within the root of the mesentery which is favored to be secondary to minimal amount of intra-abdominal ascites. Otherwise, normal noncontrast appearance of the pancreas.  The bowel is normal in course and caliber without wall thickening or evidence of obstruction. Normal noncontrast appearance of the appendix. No pneumoperitoneum, pneumatosis or portal venous gas.  Scattered atherosclerotic plaque within a normal caliber abdominal aorta.  Scattered shotty porta hepatis, retroperitoneal and mesenteric lymph nodes are numerous though individually not enlarged by size criteria with index gastrohepatic ligament lymph node measuring 1.3 cm in greatest short axis diameter (coronal image 81, series 5), aortocaval lymph node measuring 0.6 cm in greatest short axis diameter (image 34, series 2) and index mesenteric lymph node measuring 0.5 cm in diameter (image 53, series 2).  Scattered calcifications within a normal sized prostate gland. There is a small amount  of fluid seen within the pelvic cul-de-sac and within the bilateral inguinal canals.  Limited visualization of lower thorax is negative for focal airspace opacity or pleural effusion.  Normal heart size. There is diffuse decreased attenuation of the intra cardiac blood pool suggestive of anemia.  No acute or aggressive osseous abnormalities.  Avascular necrosis involving the bilateral femoral heads with associated mild-to-moderate bilateral hip degenerative change, articular surface collapse and irregularity, left greater than right.  Tiny left-sided mesenteric fat containing inguinal hernia. Regional soft tissues appear otherwise normal.  IMPRESSION: 1. Nodularity hepatic contour for suggestive of hepatic cirrhosis. This finding is associated with a small volume intra-abdominal ascites. 2. There is stranding throughout the root of the mesentery, presumably the sequela of small volume intra-abdominal ascites. Otherwise, normal noncontrast appearance of the pancreas. If there is clinical concern for acute pancreatitis, further evaluation with serum amylase and lipase levels is recommended. 3. Mild diffuse gallbladder wall thickening, presumably secondary to underdistention and small amount of intra-abdominal ascites, however if there is clinical concern for acute cholecystitis, further evaluation could be performed with right upper quadrant abdominal ultrasound as indicated. 4. Punctate (approximately 1.2 cm) hypo attenuating lesion within the central aspect of the liver, incompletely characterized on this noncontrast examination, and while potentially representative of a hepatic cyst given suspected hepatic cirrhosis, further evaluation with nonemergent abdominal MRI could be performed as clinically indicated. 5. Sequela of avascular necrosis involving the bilateral femoral heads with associated mild-to-moderate bilateral hip degenerative change and  bilateral femoral head articular surface collapse and irregularity,  left greater than right.   Electronically Signed   By: Sandi Mariscal M.D.   On: 10/16/2014 11:55   Dg Chest 2 View  10/16/2014   CLINICAL DATA:  Chest pain.  EXAM: CHEST  2 VIEW  COMPARISON:  August 05, 2003.  FINDINGS: The heart size and mediastinal contours are within normal limits. Both lungs are clear. No pneumothorax or pleural effusion is noted. The visualized skeletal structures are unremarkable.  IMPRESSION: No active cardiopulmonary disease.   Electronically Signed   By: Marijo Conception, M.D.   On: 10/16/2014 09:46   US Abdomen Limited Ruq  10/16/2014   CLINICAL DATA:  74 year old male with abnormal liver and gallbladder and small volume ascites on noncontrast CT Abdomen and Pelvis. Initial encounter.  EXAM: US ABDOMEN LIMITED - RIGHT UPPER QUADRANT  COMPARISON:  CT Abdomen and Pelvis 1125 hours today.  FINDINGS: Gallbladder:  Decompressed with thickening of the gallbladder wall up to 5-6 mm. No sonographic Murphy sign elicited. No sludge or stones identified within the gallbladder lumen.  Common bile duct:  Diameter: 3 mm, normal  Liver:  Nodular liver contour re- identified. No intrahepatic biliary ductal dilatation. Simple appearing cyst in the central liver measuring 17 mm and corresponding to the low-density finding on the CT earlier today.  Other findings: Small volume perihepatic ascites is less apparent (image 34). Negative visualized right kidney.  IMPRESSION: 1. Cirrhotic liver. The small central hypodense liver lesions seen on the earlier CT is a benign cyst. 2. Gallbladder wall thickening without stones or sonographic Murphy sign most likely is reactive / sequelae of chronic liver disease.   Electronically Signed   By: Genevie Ann M.D.   On: 10/16/2014 13:21     Virginia Crews, MD 10/17/2014, 2:10 PM PGY-1, [place] Intern pager: [phone], text pages welcome

## 2014-10-17 NOTE — Progress Notes (Signed)
Utilization Review Completed.  

## 2014-10-17 NOTE — Progress Notes (Signed)
Subjective: Some black stools.  No overt red hematochezia. No abdominal pain. Is hungry.  Objective: Vital signs in last 24 hours: Temp:  [98.5 F (36.9 C)-100.8 F (38.2 C)] 98.5 F (36.9 C) (06/03 0827) Pulse Rate:  [68-112] 80 (06/03 0827) Resp:  [14-32] 22 (06/03 0827) BP: (74-140)/(42-68) 131/64 mmHg (06/03 0827) SpO2:  [100 %] 100 % (06/03 0827) Weight:  [73.3 kg (161 lb 9.6 oz)] 73.3 kg (161 lb 9.6 oz) (06/02 1807) Weight change:  Last BM Date: 10/17/14  PE: GEN:  NAD, pleasant, engaging personality ABD:  Soft, non-tender  Lab Results: CBC    Component Value Date/Time   WBC 9.0 10/17/2014 0419   RBC 2.05* 10/17/2014 0419   HGB 7.0* 10/17/2014 0419   HCT 20.3* 10/17/2014 0419   PLT 85* 10/17/2014 0419   MCV 99.0 10/17/2014 0419   MCH 34.1* 10/17/2014 0419   MCHC 34.5 10/17/2014 0419   RDW 16.7* 10/17/2014 0419   LYMPHSABS 2.9 10/16/2014 0915   MONOABS 0.7 10/16/2014 0915   EOSABS 0.2 10/16/2014 0915   BASOSABS 0.1 10/16/2014 0915   CMP     Component Value Date/Time   NA 140 10/17/2014 0419   K 3.9 10/17/2014 0419   CL 114* 10/17/2014 0419   CO2 21* 10/17/2014 0419   GLUCOSE 94 10/17/2014 0419   BUN 35* 10/17/2014 0419   CREATININE 1.25* 10/17/2014 0419   CREATININE 1.07 01/27/2014 1136   CALCIUM 8.0* 10/17/2014 0419   PROT 5.5* 10/17/2014 0419   ALBUMIN 2.2* 10/17/2014 0419   AST 33 10/17/2014 0419   ALT 24 10/17/2014 0419   ALKPHOS 50 10/17/2014 0419   BILITOT 1.9* 10/17/2014 0419   GFRNONAA 56* 10/17/2014 0419   GFRAA >60 10/17/2014 0419   Assessment:  1.  GI bleeding, black and maroon stools.  Now mostly dark.  Suspect upper GI tract (ulcer vs gastritis vs portal gastropathy seem most likely), but unfortunately patient unable to be sedated for his attempted endoscopy yesterday. 2.  Anemia.  Likely acute blood loss.    Plan:  1.  Clear liquid diet. 2.  Continued medical therapy with intravenous PPI; may switch to oral therapy tomorrow. 3.   If patient has continued black stools and/or interval persistent decline in hemoglobin, will need to consider endoscopy with propofol (if emergent, do in OR over weekend; if not emergent do in endoscopy unit Monday).  On the other hand, if his anemia stabilizes and his black stool improves, we could likely discharge patient in the next few days with anticipated outpatient evaluation with propofol. 4.  Will follow.   Landry Dyke 10/17/2014, 11:00 AM   Pager [phone] If no answer or after 5 PM call [phone]

## 2014-10-17 NOTE — Progress Notes (Signed)
Pt had a 11 beat run of Vtach. Pt asymptomatic lying in bed watching tv. Vital signs WNLs. Provider on call paged. Awaiting response. Day shift RN made aware. Will continue to monitor

## 2014-10-18 LAB — CBC
HEMATOCRIT: 18.9 % — AB (ref 39.0–52.0)
HEMATOCRIT: 24.2 % — AB (ref 39.0–52.0)
HEMOGLOBIN: 8.5 g/dL — AB (ref 13.0–17.0)
Hemoglobin: 6.5 g/dL — CL (ref 13.0–17.0)
MCH: 34.6 pg — ABNORMAL HIGH (ref 26.0–34.0)
MCH: 34.4 pg — ABNORMAL HIGH (ref 26.0–34.0)
MCHC: 34.4 g/dL (ref 30.0–36.0)
MCHC: 35.1 g/dL (ref 30.0–36.0)
MCV: 100.5 fL — ABNORMAL HIGH (ref 78.0–100.0)
MCV: 98 fL (ref 78.0–100.0)
PLATELETS: 63 10*3/uL — AB (ref 150–400)
PLATELETS: 76 10*3/uL — AB (ref 150–400)
RBC: 1.88 MIL/uL — ABNORMAL LOW (ref 4.22–5.81)
RBC: 2.47 MIL/uL — AB (ref 4.22–5.81)
RDW: 17.7 % — AB (ref 11.5–15.5)
RDW: 18.7 % — AB (ref 11.5–15.5)
WBC: 6.8 10*3/uL (ref 4.0–10.5)
WBC: 6.7 10*3/uL (ref 4.0–10.5)

## 2014-10-18 LAB — BASIC METABOLIC PANEL
Anion gap: 8 (ref 5–15)
BUN: 19 mg/dL (ref 6–20)
CALCIUM: 7.9 mg/dL — AB (ref 8.9–10.3)
CO2: 17 mmol/L — ABNORMAL LOW (ref 22–32)
Chloride: 112 mmol/L — ABNORMAL HIGH (ref 101–111)
Creatinine, Ser: 1.03 mg/dL (ref 0.61–1.24)
GFR calc Af Amer: >60 mL/min (ref >60)
GFR calc non Af Amer: >60 mL/min (ref >60)
GLUCOSE: 97 mg/dL (ref 65–99)
POTASSIUM: 4.2 mmol/L (ref 3.5–5.1)
Sodium: 137 mmol/L (ref 135–145)

## 2014-10-18 LAB — PREPARE RBC (CROSSMATCH)

## 2014-10-18 MED ORDER — SODIUM CHLORIDE 0.9 % IV SOLN
Freq: Once | INTRAVENOUS | Status: AC
  Administered 2014-10-18: 06:00:00 via INTRAVENOUS

## 2014-10-18 MED ORDER — PANTOPRAZOLE SODIUM 40 MG PO TBEC
40.0000 mg | DELAYED_RELEASE_TABLET | Freq: Two times a day (BID) | ORAL | Status: DC
  Administered 2014-10-18 – 2014-10-21 (×7): 40 mg via ORAL
  Filled 2014-10-18 (×5): qty 1

## 2014-10-18 NOTE — Progress Notes (Signed)
Subjective: No dark stools or frank hematochezia. No abdominal pain. Tolerating clear liquid diet.  Objective: Vital signs in last 24 hours: Temp:  [98.2 F (36.8 C)-98.7 F (37.1 C)] 98.6 F (37 C) (06/04 1125) Pulse Rate:  [56-104] 70 (06/04 1125) Resp:  [16-28] 23 (06/04 1125) BP: (108-150)/(50-73) 128/64 mmHg (06/04 1125) SpO2:  [94 %-100 %] 100 % (06/04 1125) Weight:  [75.2 kg (165 lb 12.6 oz)] 75.2 kg (165 lb 12.6 oz) (06/04 0426) Weight change: 0.356 kg (12.6 oz) Last BM Date: 10/17/14  PE: GEN:  NAD ABD:  Soft  Lab Results: CBC    Component Value Date/Time   WBC 6.8 10/18/2014 0300   RBC 1.88* 10/18/2014 0300   HGB 6.5* 10/18/2014 0300   HCT 18.9* 10/18/2014 0300   PLT 63* 10/18/2014 0300   MCV 100.5* 10/18/2014 0300   MCH 34.6* 10/18/2014 0300   MCHC 34.4 10/18/2014 0300   RDW 17.7* 10/18/2014 0300   LYMPHSABS 2.9 10/16/2014 0915   MONOABS 0.7 10/16/2014 0915   EOSABS 0.2 10/16/2014 0915   BASOSABS 0.1 10/16/2014 0915   CMP     Component Value Date/Time   NA 137 10/18/2014 0300   K 4.2 10/18/2014 0300   CL 112* 10/18/2014 0300   CO2 17* 10/18/2014 0300   GLUCOSE 97 10/18/2014 0300   BUN 19 10/18/2014 0300   CREATININE 1.03 10/18/2014 0300   CREATININE 1.07 01/27/2014 1136   CALCIUM 7.9* 10/18/2014 0300   PROT 5.5* 10/17/2014 0419   ALBUMIN 2.2* 10/17/2014 0419   AST 33 10/17/2014 0419   ALT 24 10/17/2014 0419   ALKPHOS 50 10/17/2014 0419   BILITOT 1.9* 10/17/2014 0419   GFRNONAA >60 10/18/2014 0300   GFRAA >60 10/18/2014 0300   Assessment:  1.  Melena and hematochezia, resolved.  Suspect portal gastropathy, given his cirrhosis and enhanced by his thrombocytopenia, but no symptoms suggestive of overt variceal bleeding. 2.  Anemia, interval worsening overnight but without overt bleeding, possibly reflective of equilibration changes.  Plan:  1.  Continue PPI. 2.  Follow Hgb; if continues to downtrend, will need endoscopy prior to discharge  (which will need to be done with propofol); if Hgb stabilizes, will manage medically with plan for outpatient endoscopy +/- colonoscopy. 3.  Advance diet today. 4.  Will follow.   Landry Dyke 10/18/2014, 1:19 PM   Pager [phone] If no answer or after 5 PM call [phone]

## 2014-10-18 NOTE — Progress Notes (Signed)
Family Medicine Teaching Service Daily Progress Note Intern Pager: [phone]  Patient name: Gus Dicks Medical record number: [id number] Date of birth: March 20, 1941 Age: 74 y.o. Gender: male  Primary Care Provider: Luiz Blare, DO Consultants: GI, Cardiology Code Status: Full code  Pt Overview and Major Events to Date:  6/2 - admit for melena, hematochezia, atypical chest pain 6/4- 2nd unit of pRBCs given  Assessment and Plan: Kierian Hoggan is a 74 y.o. male presenting with melena, hematochezia, atypical chest pain. PMH significant for arthritis, gout, asthma, HTN and ETOH abuse presenting with melena, fatigue, abdominal pain, and atypical chest pain.   Melena/Hematochezia: FOBT+, abd u/s with cirrhosis and a benign hepatic cyst, gallbladder wall thickening w/out Murphy's sign. CT abd with hepatic cirrhosis, ascites, gallbladder wall thickening, sequela of AVN on b/l femoral head w/ mild-mod degenerative changes. Plt 82>63, Hgb 7.8 >7.0>6.5 -c/s GI, appreciate recs: -EGD 6/2 unsuccessful 2/2 combativeness under sedation -Will switch PPI to oral 6/4  -if continued melena or persistent drop in hemoglobin, EGD under propofol -Will need outpatient colonoscopy - Continue to monitor Hgb; 1 pRBCs ordered for this am. S/p 1 unit thus far.   - f/u post transfusion CBC  Atypical chest pain: EKG with PVCs and trigeminy. Trop neg x4. LA normal. Cardiology reports low suspicion for  ACS. repeat EKG nonischemic -c/s cardiology, appreciate recs. - no further ischemic work up at this time. Consider nuclear stress test outpatient -Telemetry -ASA held in setting of GI bleed  AKI: Cr 1.63 on admission >> 1.03 this AM (1.07 in 01/2014). Likely related to volume loss in setting of GI bleed. -s/p 2L IVF - Continue to monitor  ETOH abuse: On admission, AST 49, ALT 30, bilirubin 1.6, platelets 107 (normal 2 years ago) -CIWA protocol  HTN: hypotensive in ED to  70/40's. Now normotensive -Hold home Lisinopril for now - Can consider restarting if BPs elevated consistently  Gout: on Allopurinol and Colcrys at home -Continue allopurinol  FEN/GI: Reg diet, Protonix infusion Prophylaxis: SCDs in setting of GI bleed  Disposition: SDU for close monitoring.  D/c pending GI, cardiology recs, and stabilization of hemoglobin.  Subjective:  Patient reports that he is feeling really well today.  He denies CP, SOB, N/V, abdominal pain, melena, hematochezia, dizziness.  He reports that he wheezed and for that reason was placed on O2 a couple of days ago.    Objective: Temp:  [98.2 F (36.8 C)-98.7 F (37.1 C)] 98.3 F (36.8 C) (06/04 0515) Pulse Rate:  [69-189] 71 (06/04 0540) Resp:  [17-28] 23 (06/04 0540) BP: (108-137)/(50-71) 133/65 mmHg (06/04 0515) SpO2:  [94 %-100 %] 100 % (06/04 0540) Weight:  [165 lb 12.6 oz (75.2 kg)] 165 lb 12.6 oz (75.2 kg) (06/04 0426) Physical Exam: General: well appearing male, NAD, sitting up in bed Cardiovascular: RRR, soft blowing murmur at apex Respiratory: CTAB, normal WOB, end expiratory wheeze appreciated, 2L [place] in place Abdomen: Soft, NT/ND, +BS, no rebound/guarding Extremities: WWP, no edema, +2DP  Laboratory:  Recent Labs Lab 10/17/14 0419 10/17/14 1545 10/18/14 0300  WBC 9.0 8.7 6.8  HGB 7.0* 7.2* 6.5*  HCT 20.3* 21.2* 18.9*  PLT 85* 82* 63*    Recent Labs Lab 10/16/14 0915 10/17/14 0419 10/18/14 0300  NA 137 140 137  K 5.5* 3.9 4.2  CL 107 114* 112*  CO2 23 21* 17*  BUN 37* 35* 19  CREATININE 1.63* 1.25* 1.03  CALCIUM 8.4* 8.0* 7.9*  PROT 6.1* 5.5*  --   BILITOT 1.6*  1.9*  --   ALKPHOS 74 50  --   ALT 30 24  --   AST 49* 33  --   GLUCOSE 113* 94 97   Troponin neg x3  EKG: NSR, PVCs  Imaging/Diagnostic Tests: No new  Janora Norlander, DO 10/18/2014, 5:52 AM PGY-1, [place] Intern pager: [phone], text pages welcome

## 2014-10-18 NOTE — Progress Notes (Addendum)
CRITICAL VALUE ALERT  Critical value received:  Hemoglobin 6.5  Date of notification:  10/18/2014  Time of notification:  [id number]  Critical value read back:Yes.    Nurse who received alert:  Annice Pih, RN  MD notified (1st page):  Paged family medicine teaching services   Time of first page:  [phone]  MD notified (2nd page): Janora Norlander, MD  Time of second page:  Responding MD:   Time MD responded:  [phone]

## 2014-10-19 LAB — CBC
HCT: 25.3 % — ABNORMAL LOW (ref 39.0–52.0)
HEMATOCRIT: 23.5 % — AB (ref 39.0–52.0)
HEMOGLOBIN: 8.8 g/dL — AB (ref 13.0–17.0)
Hemoglobin: 8.2 g/dL — ABNORMAL LOW (ref 13.0–17.0)
MCH: 34.5 pg — ABNORMAL HIGH (ref 26.0–34.0)
MCH: 34.4 pg — ABNORMAL HIGH (ref 26.0–34.0)
MCHC: 34.9 g/dL (ref 30.0–36.0)
MCHC: 34.8 g/dL (ref 30.0–36.0)
MCV: 98.7 fL (ref 78.0–100.0)
MCV: 98.8 fL (ref 78.0–100.0)
PLATELETS: 71 10*3/uL — AB (ref 150–400)
Platelets: 78 10*3/uL — ABNORMAL LOW (ref 150–400)
RBC: 2.38 MIL/uL — ABNORMAL LOW (ref 4.22–5.81)
RBC: 2.56 MIL/uL — AB (ref 4.22–5.81)
RDW: 18.3 % — AB (ref 11.5–15.5)
RDW: 17.9 % — ABNORMAL HIGH (ref 11.5–15.5)
WBC: 7.2 10*3/uL (ref 4.0–10.5)
WBC: 7.7 10*3/uL (ref 4.0–10.5)

## 2014-10-19 LAB — TYPE AND SCREEN
ABO/RH(D): AB POS
Antibody Screen: NEGATIVE
UNIT DIVISION: 0
Unit division: 0

## 2014-10-19 LAB — BASIC METABOLIC PANEL
Anion gap: 4 — ABNORMAL LOW (ref 5–15)
BUN: 12 mg/dL (ref 6–20)
CALCIUM: 8.2 mg/dL — AB (ref 8.9–10.3)
CO2: 24 mmol/L (ref 22–32)
Chloride: 110 mmol/L (ref 101–111)
Creatinine, Ser: 1.11 mg/dL (ref 0.61–1.24)
GFR calc Af Amer: >60 mL/min (ref >60)
GFR calc non Af Amer: >60 mL/min (ref >60)
GLUCOSE: 116 mg/dL — AB (ref 65–99)
Potassium: 3.5 mmol/L (ref 3.5–5.1)
SODIUM: 138 mmol/L (ref 135–145)

## 2014-10-19 MED ORDER — LISINOPRIL 2.5 MG PO TABS
2.5000 mg | ORAL_TABLET | Freq: Every day | ORAL | Status: DC
  Filled 2014-10-19: qty 1

## 2014-10-19 MED ORDER — LISINOPRIL 20 MG PO TABS: 20.0000 mg | ORAL_TABLET | Freq: Every day | ORAL | Status: DC

## 2014-10-19 MED ORDER — LISINOPRIL 10 MG PO TABS
10.0000 mg | ORAL_TABLET | Freq: Every day | ORAL | Status: DC
  Filled 2014-10-19: qty 1

## 2014-10-19 NOTE — Progress Notes (Signed)
Subjective: Normal stool today; no black stool or hematochezia. No abdominal pain; tolerating diet.  Objective: Vital signs in last 24 hours: Temp:  [98.1 F (36.7 C)-99.1 F (37.3 C)] 99.1 F (37.3 C) (06/05 1315) Pulse Rate:  [64-78] 78 (06/05 1315) Resp:  [17-28] 27 (06/05 1315) BP: (133-153)/(60-72) 152/72 mmHg (06/05 1315) SpO2:  [97 %-100 %] 97 % (06/05 1315) Weight change:  Last BM Date: 10/18/14  PE: GEN:  Pleasant, NAD ABD:  Soft, non-tender  Lab Results: CBC    Component Value Date/Time   WBC 7.2 10/19/2014 0405   RBC 2.38* 10/19/2014 0405   HGB 8.2* 10/19/2014 0405   HCT 23.5* 10/19/2014 0405   PLT 71* 10/19/2014 0405   MCV 98.7 10/19/2014 0405   MCH 34.5* 10/19/2014 0405   MCHC 34.9 10/19/2014 0405   RDW 18.3* 10/19/2014 0405   LYMPHSABS 2.9 10/16/2014 0915   MONOABS 0.7 10/16/2014 0915   EOSABS 0.2 10/16/2014 0915   BASOSABS 0.1 10/16/2014 0915   CMP     Component Value Date/Time   NA 138 10/19/2014 0405   K 3.5 10/19/2014 0405   CL 110 10/19/2014 0405   CO2 24 10/19/2014 0405   GLUCOSE 116* 10/19/2014 0405   BUN 12 10/19/2014 0405   CREATININE 1.11 10/19/2014 0405   CREATININE 1.07 01/27/2014 1136   CALCIUM 8.2* 10/19/2014 0405   PROT 5.5* 10/17/2014 0419   ALBUMIN 2.2* 10/17/2014 0419   AST 33 10/17/2014 0419   ALT 24 10/17/2014 0419   ALKPHOS 50 10/17/2014 0419   BILITOT 1.9* 10/17/2014 0419   GFRNONAA >60 10/19/2014 0405   GFRAA >60 10/19/2014 0405   Assessment:  1. Melena and hematochezia, resolved. Suspect portal gastropathy, given his cirrhosis and enhanced by his thrombocytopenia, but no symptoms suggestive of overt variceal bleeding. 2. Anemia, stable over past 24 hours.  No evidence of overt GI bleeding.  Plan:  1.  Continue pantoprazole 40 mg po bid, now and upon discharge. 2.  Advance diet to heart healthy. 3.  If allowable, patient would benefit from OOBTC/mobilization. 4.  No further testing from GI perspective  anticipated while patient is hospitalized. 5.  Will make outpatient follow-up arrangements, and patient can follow-up with Dr. Cristina Gong [phone]) as outpatient, at which time we will likely arrange outpatient endoscopy and colonoscopy. 6.  Will sign-off; please call with questions; thank you for the consult.  Landry Dyke 10/19/2014, 3:30 PM   Pager [phone] If no answer or after 5 PM call [phone]

## 2014-10-19 NOTE — Discharge Summary (Signed)
[facility] Discharge Summary  [name] Medical record number: [id number] Date of birth: 11-18-1940 Age: 74 y.o. Gender: male Date of Admission: 10/16/2014  Date of Discharge: 10/21/14 Admitting Physician: Blane Ohara McDiarmid, MD  Primary Care Provider: Luiz Blare, DO Consultants: gastroenterology, cardiology  Indication for Hospitalization: GI bleed, symptomatic anemia  Discharge Diagnoses/Problem List:  Upper GI bleed Hypotension Atypical Chest pain Acute blood loss anemia  Disposition: Discharge home  Discharge Condition: Stable  Discharge Exam:  Blood pressure 139/63, pulse 73, temperature 98.8 F (37.1 C), temperature source Oral, resp. rate 16, height 5\' 10"  (1.778 m), weight 165 lb 9.1 oz (75.1 kg), SpO2 98 %. Gen: awake, alert, well appearing male, NAD, standing at sink washing up HEENT: [place]/AT, EOMI, MMM Cardio: RRR, soft murmur at apex of heart Pulm: normal WOB, CTAB, no wheeze this am Abd: flat, NT/ND, +BS Ext: WWP, no edema MSK: normal strength, ambulates independently Neuro: no focal deficits, follows commands Psych: normal speech, mood stable  Brief Hospital Course:  Donald Moreno is a 74 y.o. male that presented with melena, hematochezia, atypical chest pain. PMH significant for arthritis, gout, asthma, HTN and ETOH abuse presenting with melena, fatigue, abdominal pain, and atypical chest pain.   In ED, patient found to be FOBT+.  Abdominal u/s revealed cirrhosis and a benign hepatic cyst, gallbladder thickening w NEG Murphy sign.  CT abdomen with hepatic cirrhosis, ascites.  Hgb 7.8, Plt 107, Lactic acid 1.64, Bili 1.6.  GI was consulted and attempted and EGD in the emergency department but EGD was unsuccessful 2/2 patient combativeness under sedation.  Patient was admitted to [facility] and medically managed on a protonix infusion and carafate.  His diet was advanced slowly.  Patient was placed in the step down unit and his  hemoglobin was monitored closely.  Patient needed a total of 2 units of pRBCs for hgb <7.0.  GI continued to follow patient and recommended continued outpatient evaluation and monitoring.  Once patient's hemoglobin was stable x24 hours, he was transferred to telemetry.  Upon discharge patient's hemoglobin was 8.4.  Patient also noted atypical chest pain in ED.  EKG showed PVCs and trigeminy.  Cardiology was consulted who recommended cycling troponins and consideration for a nuclear stress test outpatient.  Troponins were negative.  Patient's home aspirin was held in the setting of GI bleed.  Patient noted to have an AKI on admission with Cr 1.63.  He was hydrated with 2L IVF in the ED.  HE was continued on gentle hydration until he was tolerating PO.  Renal function was monitored and gradually improved to baseline.  Patient hypotensive in ED to 70/40's.  His home lisinopril was held during hospitalization.  His blood pressures improved and he remained essentially normotensive throughout the duration of his hospitalization.   He was also placed on CIWA protocol for h/o alcoholism.  No PRN Ativan was needed.  Patient was discharged in stable condition.  Discharge instructions and return precautions were reviewed with patient, who voiced good understanding.  Patient to follow up with family medicine and gastroenterology.  Issues for Follow Up:  1. Hemoglobin, recommend CBC 2. Patient to f/u with GI outpatient.  Discharged on Protonix 40mg  BID, carafate and iron 3. Blood pressure, Lisinopril held at discharge 4. Consider referral to cardiology for nuclear stress test for risk stratification.  Significant Procedures: transfused 2 units pRBCs  Significant Labs and Imaging:   Recent Labs Lab 10/20/14 0320 10/20/14 1136 10/21/14 0554  WBC  6.8 7.0 7.5  HGB 7.8* 8.8* 8.4*  HCT 22.2* 25.3* 24.5*  PLT 75* 81* 86*    Recent Labs Lab 10/16/14 0915 10/17/14 0419 10/18/14 0300 10/19/14 0405  10/20/14 0320  NA 137 140 137 138 138  K 5.5* 3.9 4.2 3.5 3.5  CL 107 114* 112* 110 111  CO2 23 21* 17* 24 23  GLUCOSE 113* 94 97 116* 107*  BUN 37* 35* 19 12 7   CREATININE 1.63* 1.25* 1.03 1.11 0.95  CALCIUM 8.4* 8.0* 7.9* 8.2* 8.3*  ALKPHOS 74 50  --   --   --   AST 49* 33  --   --   --   ALT 30 24  --   --   --   ALBUMIN 2.5* 2.2*  --   --   --    Cardiac Panel (last 3 results) No results for input(s): CKTOTAL, CKMB, TROPONINI, RELINDX in the last 72 hours. No results found. Results/Tests Pending at Time of Discharge: none  Discharge Medications:    Medication List    STOP taking these medications        lisinopril 40 MG tablet  Commonly known as:  PRINIVIL,ZESTRIL     meloxicam 15 MG tablet  Commonly known as:  MOBIC      TAKE these medications        albuterol 108 (90 BASE) MCG/ACT inhaler  Commonly known as:  PROAIR HFA  take 2 puffs every 6 hours if needed for wheezing     allopurinol 300 MG tablet  Commonly known as:  ZYLOPRIM  Take 1 tablet (300 mg total) by mouth daily.     allopurinol 100 MG tablet  Commonly known as:  ZYLOPRIM  Take 1 tablet (100 mg total) by mouth daily. Add to 300mg  tablet for combined dose of 400mg .     aspirin 81 MG tablet  Take 81 mg by mouth daily.     colchicine 0.6 MG tablet  Commonly known as:  COLCRYS  Take 2 tablets(1.2mg  total) at first sign of flare. One hour later take 1 tablet (0.6mg ).     ferrous sulfate 325 (65 FE) MG tablet  Take 1 tablet (325 mg total) by mouth 2 (two) times daily with a meal.     Fluticasone-Salmeterol 500-50 MCG/DOSE Aepb  Commonly known as:  ADVAIR DISKUS  Inhale 1 puff INTO THE LUNGS twice a day     montelukast 10 MG tablet  Commonly known as:  SINGULAIR  Take 1 tablet (10 mg total) by mouth at bedtime.     pantoprazole 40 MG tablet  Commonly known as:  PROTONIX  Take 1 tablet (40 mg total) by mouth 2 (two) times daily.     sucralfate 1 G tablet  Commonly known as:  CARAFATE   Take 1 tablet (1 g total) by mouth 4 (four) times daily -  with meals and at bedtime.        Discharge Instructions: Please refer to Patient Instructions section of EMR for full details.  Patient was counseled important signs and symptoms that should prompt return to medical care, changes in medications, dietary instructions, activity restrictions, and follow up appointments.   Follow-Up Appointments: Follow-up Information    Follow up with Ronnie Doss, DO. Go on 10/28/2014.   Specialty:  Family Medicine   Why:  3:30 pm (hospital follow up)   Contact information:   [address] [place] [address] [phone]       Schedule  an appointment as soon as possible for a visit with Cleotis Nipper, MD.   Specialty:  Gastroenterology   Why:  hospital follow up   Contact information:   [address] [place] [address] [phone]       Follow up with Moreno. Go on 10/24/2014.   Why:  9:15am lab appointment CBC   Contact information:   [address] [place]      Janora Norlander, DO 10/21/2014, 12:55 PM PGY-1, [place]

## 2014-10-19 NOTE — Progress Notes (Signed)
Patient wheezing on ausculation, however, states he doesn't need neb treatment at this time.

## 2014-10-19 NOTE — Progress Notes (Signed)
Family Medicine Teaching Service Daily Progress Note Intern Pager: [phone]  [name] Medical record number: [id number] Date of birth: 1940-05-28 Age: 74 y.o. Gender: male  Primary Care Provider: Luiz Blare, DO Consultants: GI, Cardiology Code Status: Full code  Pt Overview and Major Events to Date:  6/2 - admit for melena, hematochezia, atypical chest pain 6/4- 2nd unit of pRBCs given  Assessment and Plan: Donald Moreno is a 74 y.o. male presenting with melena, hematochezia, atypical chest pain. PMH significant for arthritis, gout, asthma, HTN and ETOH abuse presenting with melena, fatigue, abdominal pain, and atypical chest pain.   Melena/Hematochezia: FOBT+, abd u/s with cirrhosis and a benign hepatic cyst, gallbladder wall thickening w/out Murphy's sign. CT abd with hepatic cirrhosis, ascites, gallbladder wall thickening, sequela of AVN on b/l femoral head w/ mild-mod degenerative changes. Plt 82>63>71, Hgb 7.8 >7.0>6.5>8.5>8.2 -c/s GI, appreciate recs: -EGD 6/2 unsuccessful 2/2 combativeness under sedation -Will switch PPI to oral 6/4  -if continued melena or persistent drop in hemoglobin, EGD under propofol -Will need outpatient colonoscopy -Continue to monitor Hgb, S/p 2 pRBCs units this hospitalization.  -Once patient 24 hours with stable hemoglobin, can transfer out of SDU to telemetry  Atypical chest pain: EKG with PVCs and trigeminy. Trop neg x4. LA normal. Cardiology reports low suspicion for  ACS. repeat EKG nonischemic -c/s cardiology, appreciate recs. - no further ischemic work up at this time. Consider nuclear stress test outpatient -Telemetry -ASA held in setting of GI bleed  AKI: Cr 1.63 on admission >> 1.11 this AM (1.07 in 01/2014). Likely related to volume loss in setting of GI bleed. -s/p 2L IVF -Continue to monitor -KVO  ETOH abuse: On admission, AST 49, ALT 30, bilirubin 1.6, platelets 107 (normal 2  years ago) -CIWA protocol  HTN: hypotensive in ED to 70/40's. Hypertensive to 140-150's/24 hours.  Last BP 135/64 -consider restarting lisinopril at decreased dose (40mg  at home) once patient no longer bleeding.  Gout: on Allopurinol and Colcrys at home -Continue allopurinol  FEN/GI: Reg diet, Protonix infusion, KVO Prophylaxis: SCDs in setting of GI bleed  Disposition: SDU for close monitoring.  Anticipate patient can transfer to tele once hgb stable x24 hours.  D/c pending GI, cardiology recs, and stabilization of hemoglobin.  Subjective:  Patient reports that he is feeling well today.  He tolerated a normal diet yesterday without difficulty.  He denies CP, SOB, N/V, abdominal pain, melena, hematochezia, dizziness.  He is breathing on room air.    Objective: Temp:  [98.2 F (36.8 C)-98.7 F (37.1 C)] 98.7 F (37.1 C) (06/05 0416) Pulse Rate:  [56-104] 72 (06/05 0416) Resp:  [16-28] 24 (06/05 0416) BP: (115-156)/(57-85) 135/64 mmHg (06/05 0416) SpO2:  [99 %-100 %] 100 % (06/05 0416) Physical Exam: General: well appearing male, NAD, sitting up in bed watching TV Cardiovascular: RRR, soft blowing murmur at apex Respiratory: CTAB, normal WOB, end expiratory wheeze appreciated, breathing on room air Abdomen: Soft, NT/ND, +BS, no rebound/guarding Extremities: WWP, no edema, +2DP  Laboratory:  Recent Labs Lab 10/18/14 0300 10/18/14 1349 10/19/14 0405  WBC 6.8 6.7 7.2  HGB 6.5* 8.5* 8.2*  HCT 18.9* 24.2* 23.5*  PLT 63* 76* 71*    Recent Labs Lab 10/16/14 0915 10/17/14 0419 10/18/14 0300 10/19/14 0405  NA 137 140 137 138  K 5.5* 3.9 4.2 3.5  CL 107 114* 112* 110  CO2 23 21* 17* 24  BUN 37* 35* 19 12  CREATININE 1.63* 1.25* 1.03 1.11  CALCIUM 8.4*  8.0* 7.9* 8.2*  PROT 6.1* 5.5*  --   --   BILITOT 1.6* 1.9*  --   --   ALKPHOS 74 50  --   --   ALT 30 24  --   --   AST 49* 33  --   --   GLUCOSE 113* 94 97 116*   Troponin neg x3  EKG: NSR,  PVCs  Imaging/Diagnostic Tests: No new  Janora Norlander, DO 10/19/2014, 6:27 AM PGY-1, [place] Intern pager: [phone], text pages welcome

## 2014-10-20 LAB — CBC
HCT: 22.2 % — ABNORMAL LOW (ref 39.0–52.0)
HCT: 25.3 % — ABNORMAL LOW (ref 39.0–52.0)
Hemoglobin: 7.8 g/dL — ABNORMAL LOW (ref 13.0–17.0)
Hemoglobin: 8.8 g/dL — ABNORMAL LOW (ref 13.0–17.0)
MCH: 34.5 pg — ABNORMAL HIGH (ref 26.0–34.0)
MCH: 34.6 pg — ABNORMAL HIGH (ref 26.0–34.0)
MCHC: 35.1 g/dL (ref 30.0–36.0)
MCHC: 34.8 g/dL (ref 30.0–36.0)
MCV: 98.2 fL (ref 78.0–100.0)
MCV: 99.6 fL (ref 78.0–100.0)
PLATELETS: 75 10*3/uL — AB (ref 150–400)
Platelets: 81 10*3/uL — ABNORMAL LOW (ref 150–400)
RBC: 2.26 MIL/uL — AB (ref 4.22–5.81)
RBC: 2.54 MIL/uL — ABNORMAL LOW (ref 4.22–5.81)
RDW: 17.9 % — AB (ref 11.5–15.5)
RDW: 18 % — AB (ref 11.5–15.5)
WBC: 6.8 10*3/uL (ref 4.0–10.5)
WBC: 7 10*3/uL (ref 4.0–10.5)

## 2014-10-20 LAB — BASIC METABOLIC PANEL
ANION GAP: 4 — AB (ref 5–15)
BUN: 7 mg/dL (ref 6–20)
CHLORIDE: 111 mmol/L (ref 101–111)
CO2: 23 mmol/L (ref 22–32)
CREATININE: 0.95 mg/dL (ref 0.61–1.24)
Calcium: 8.3 mg/dL — ABNORMAL LOW (ref 8.9–10.3)
Glucose, Bld: 107 mg/dL — ABNORMAL HIGH (ref 65–99)
Potassium: 3.5 mmol/L (ref 3.5–5.1)
Sodium: 138 mmol/L (ref 135–145)

## 2014-10-20 MED ORDER — PANTOPRAZOLE SODIUM 40 MG PO TBEC: 40.0000 mg | DELAYED_RELEASE_TABLET | Freq: Two times a day (BID) | ORAL | Status: DC

## 2014-10-20 NOTE — Progress Notes (Signed)
NURSING PROGRESS NOTE  Donald Moreno [id number] Transfer Data: 10/20/2014 3:43 PM Attending Provider: Blane Ohara McDiarmid, MD [id number] Gerarda Fraction, DO Code Status: Full  Donald Moreno is a 74 y.o. male patient transferred from [place]  -No acute distress noted.  -No complaints of shortness of breath.  -No complaints of chest pain.   Cardiac Monitoring: Box # 08 in place.   Blood pressure 153/87, pulse 70, temperature 99 F (37.2 C), temperature source Oral, resp. rate 16, height 5\' 10"  (1.778 m), weight 75.1 kg (165 lb 9.1 oz), SpO2 100 %.    Allergies:  Review of patient's allergies indicates no known allergies.  Past Medical History:   has a past medical history of Arthritis; Gout; Asthma; Hypertension; and ETOH abuse.  Past Surgical History:   has no past surgical history on file.  Social History:   reports that he has never smoked. He uses smokeless tobacco. He reports that he drinks about 1.2 oz of alcohol per week. He reports that he does not use illicit drugs.  Skin: intact  Patient/Family orientated to room. Information packet given to patient/family. Admission inpatient armband information verified with patient/family to include name and date of birth and placed on patient arm. Side rails up x 2, fall assessment and education completed with patient/family. Patient/family able to verbalize understanding of risk associated with falls and verbalized understanding to call for assistance before getting out of bed. Call light within reach. Patient/family able to voice and demonstrate understanding of unit orientation instructions.    Will continue to evaluate and treat per MD orders.

## 2014-10-20 NOTE — Progress Notes (Signed)
Report called to nurse on [place]. Will transport pt via wheelchair. Teresita Madura RN

## 2014-10-20 NOTE — Evaluation (Signed)
Physical Therapy Evaluation Patient Details Name: Dionis Spar MRN: [id number] DOB: 07/26/1940 Today's Date: 10/20/2014   History of Present Illness  Ramces Stetzer is a 74 y.o. male presenting with melena, hematochezia, atypical chest pain. PMH significant for arthritis, gout, asthma, HTN and ETOH abuse presenting with melena, fatigue, abdominal pain, and atypical chest pain.  EGD attempt was unsuccessful.  Clinical Impression  Pt is at or close to baseline functioning and should be safe at home with limited available assist. There are no further acute PT needs.  Will sign off at this time.     Follow Up Recommendations No PT follow up    Equipment Recommendations  None recommended by PT    Recommendations for Other Services       Precautions / Restrictions Precautions Precautions: None Restrictions Weight Bearing Restrictions: No      Mobility  Bed Mobility Overal bed mobility: Independent                Transfers Overall transfer level: Independent                  Ambulation/Gait Ambulation/Gait assistance: Independent Ambulation Distance (Feet): 300 Feet Assistive device: None Gait Pattern/deviations: WFL(Within Functional Limits) Gait velocity: moderate. Gait velocity interpretation: at or above normal speed for age/gender General Gait Details: WFL  Stairs Stairs: Yes Stairs assistance: Modified independent (Device/Increase time) Stair Management: One rail Right;Alternating pattern;Step to pattern;Forwards Number of Stairs: 12 General stair comments: safe with rail  Wheelchair Mobility    Modified Rankin (Stroke Patients Only)       Balance Overall balance assessment: Needs assistance;No apparent balance deficits (not formally assessed)                           High level balance activites: Backward walking;Direction changes;Turns;Sudden stops;Head turns High Level Balance Comments: no deviations or overt LOB              Pertinent Vitals/Pain Pain Assessment: No/denies pain    Home Living Family/patient expects to be discharged to:: Private residence Living Arrangements: Other relatives Available Help at Discharge: Family;Available PRN/intermittently Type of Home: House Home Access: Level entry     Home Layout: One level Home Equipment: None      Prior Function Level of Independence: Independent               Hand Dominance        Extremity/Trunk Assessment   Upper Extremity Assessment: Overall WFL for tasks assessed           Lower Extremity Assessment: Overall WFL for tasks assessed      Cervical / Trunk Assessment: Normal  Communication   Communication: No difficulties  Cognition Arousal/Alertness: Awake/alert Behavior During Therapy: WFL for tasks assessed/performed Overall Cognitive Status: Within Functional Limits for tasks assessed                      General Comments      Exercises        Assessment/Plan    PT Assessment Patent does not need any further PT services  PT Diagnosis     PT Problem List    PT Treatment Interventions     PT Goals (Current goals can be found in the Care Plan section) Acute Rehab PT Goals PT Goal Formulation: All assessment and education complete, DC therapy    Frequency     Barriers to discharge  Co-evaluation               End of Session   Activity Tolerance: Patient tolerated treatment well Patient left: Other (comment) (walking around in the room) Nurse Communication: Mobility status         Time: [id number] PT Time Calculation (min) (ACUTE ONLY): 17 min   Charges:   PT Evaluation $Initial PT Evaluation Tier I: 1 Procedure     PT G Codes:        Eula Jaster, Tessie Fass 10/20/2014, 5:29 PM 10/20/2014  Donnella Sham, PT [phone]  (pager)

## 2014-10-20 NOTE — Progress Notes (Signed)
Family Medicine Teaching Service Daily Progress Note Intern Pager: [phone]  [name] Medical record number: [id number] Date of birth: [birth year] Age: 74 y.o. Gender: male  Primary Care Provider: Luiz Blare, DO Consultants: GI, Cardiology Code Status: Full code  Pt Overview and Major Events to Date:  6/2 - admit for melena, hematochezia, atypical chest pain 6/4- 2nd unit of pRBCs given  Assessment and Plan: Donald Moreno is a 74 y.o. male presenting with melena, hematochezia, atypical chest pain. PMH significant for arthritis, gout, asthma, HTN and ETOH abuse presenting with melena, fatigue, abdominal pain, and atypical chest pain.   Melena/Hematochezia: FOBT+, abd u/s with cirrhosis and a benign hepatic cyst, gallbladder wall thickening w/out Murphy's sign. CT abd with hepatic cirrhosis, ascites, gallbladder wall thickening, sequela of AVN on b/l femoral head w/ mild-mod degenerative changes. Plt 82>63>>75, Hgb 6.3>7.8 >>6.5>8.5>>8.8>7.8 -c/s GI, appreciate recs: signed off 6/5 -EGD 6/2 unsuccessful 2/2 combativeness under sedation -Will switch PPI to oral 6/4  -if continued melena or persistent drop in hemoglobin, EGD under propofol -Will need outpatient colonoscopy -Continue to monitor Hgb, S/p 2 pRBCs units this hospitalization.  -Recheck hgb at 12pm today.  If hemoglobin stable, can transfer out of SDU to telemetry  Atypical chest pain: EKG with PVCs and trigeminy. Trop neg x4. LA normal. Cardiology reports low suspicion for  ACS. repeat EKG nonischemic -c/s cardiology, appreciate recs. - no further ischemic work up at this time. Consider nuclear stress test outpatient -Telemetry  -ASA held in setting of GI bleed  AKI: Resolved. Cr 1.63 on admission >> 0.95 this AM (1.07 in 01/2014). Likely related to volume loss in setting of GI bleed. -s/p 2L IVF -Continue to monitor -KVO  ETOH abuse: On admission, AST 49, ALT 30,  bilirubin 1.6, platelets 107 (normal 2 years ago) -CIWA protocol  HTN: hypotensive in ED to 70/40's. Hypertensive to 140-150's/24 hours.  Last BP 137/60 -consider restarting lisinopril at decreased dose (40mg  at home) once patient's hgb stable  Gout: on Allopurinol and Colcrys at home -Continue allopurinol  FEN/GI: Reg diet, Protonix infusion, KVO Prophylaxis: SCDs in setting of GI bleed  Disposition: SDU for close monitoring.  Anticipate patient can transfer to tele once hgb stable x24 hours.  Hopefully today.  D/c pending stabilization of hemoglobin.  Subjective:  Patient reports that he is feeling well today. He continues to tolerate diet without difficulty.  He reports that he had a normal BM yesterday.  Patient declined his albuterol yesterday, in spite of having wheeze on exam.  Patient reports that he does not feel like he isn't breathing well so he did not want it.  No hematochezia, melena, dizziness, SOB, CP, abdominal pain, nausea or vomiting.    Objective: Temp:  [97.9 F (36.6 C)-99.1 F (37.3 C)] 98 F (36.7 C) (06/06 0400) Pulse Rate:  [67-82] 67 (06/06 0400) Resp:  [22-27] 26 (06/05 2020) BP: (115-152)/(53-72) 137/60 mmHg (06/06 0400) SpO2:  [97 %-100 %] 100 % (06/06 0400) Weight:  [166 lb 0.1 oz (75.3 kg)] 166 lb 0.1 oz (75.3 kg) (06/06 0400) Physical Exam: General: well appearing male, pleasant, NAD, lying in bed resting Cardiovascular: RRR, soft blowing murmur at apex Respiratory: normal WOB, end expiratory wheeze appreciated, breathing on room air Abdomen: Soft, NT/ND, +BS, no rebound/guarding Extremities: WWP, no edema, +2DP  Laboratory:  Recent Labs Lab 10/19/14 0405 10/19/14 1705 10/20/14 0320  WBC 7.2 7.7 6.8  HGB 8.2* 8.8* 7.8*  HCT 23.5* 25.3* 22.2*  PLT 71* 78* 75*  Recent Labs Lab 10/16/14 0915 10/17/14 0419 10/18/14 0300 10/19/14 0405 10/20/14 0320  NA 137 140 137 138 138  K 5.5* 3.9 4.2 3.5 3.5  CL 107 114* 112* 110 111  CO2 23  21* 17* 24 23  BUN 37* 35* 19 12 7   CREATININE 1.63* 1.25* 1.03 1.11 0.95  CALCIUM 8.4* 8.0* 7.9* 8.2* 8.3*  PROT 6.1* 5.5*  --   --   --   BILITOT 1.6* 1.9*  --   --   --   ALKPHOS 74 50  --   --   --   ALT 30 24  --   --   --   AST 49* 33  --   --   --   GLUCOSE 113* 94 97 116* 107*   Troponin neg x3  EKG: NSR, PVCs  Imaging/Diagnostic Tests: No new  Janora Norlander, DO 10/20/2014, 7:00 AM PGY-1, [facility] Intern pager: [phone], text pages welcome

## 2014-10-20 NOTE — Progress Notes (Signed)
Report received from Maudie Mercury, [place] for transfer to [phone]

## 2014-10-21 LAB — CBC
HEMATOCRIT: 24.5 % — AB (ref 39.0–52.0)
Hemoglobin: 8.4 g/dL — ABNORMAL LOW (ref 13.0–17.0)
MCH: 34.3 pg — ABNORMAL HIGH (ref 26.0–34.0)
MCHC: 34.3 g/dL (ref 30.0–36.0)
MCV: 100 fL (ref 78.0–100.0)
PLATELETS: 86 10*3/uL — AB (ref 150–400)
RBC: 2.45 MIL/uL — ABNORMAL LOW (ref 4.22–5.81)
RDW: 18 % — AB (ref 11.5–15.5)
WBC: 7.5 10*3/uL (ref 4.0–10.5)

## 2014-10-21 MED ORDER — FERROUS SULFATE 325 (65 FE) MG PO TABS: 325.0000 mg | ORAL_TABLET | Freq: Two times a day (BID) | ORAL | Status: DC

## 2014-10-21 MED ORDER — FERROUS SULFATE 325 (65 FE) MG PO TABS
325.0000 mg | ORAL_TABLET | Freq: Two times a day (BID) | ORAL | Status: DC
  Filled 2014-10-21 (×2): qty 1

## 2014-10-21 MED ORDER — SUCRALFATE 1 G PO TABS: 1.0000 g | ORAL_TABLET | Freq: Three times a day (TID) | ORAL | Status: DC

## 2014-10-21 NOTE — Care Management Note (Signed)
Case Management Note  Patient Details  Name: Donald Moreno MRN: [id number] Date of Birth: 1940-09-15  Subjective/Objective:  Patient lives with nephew and niece, patient is indep.  Patient is for possible dc today.                  Action/Plan:   Expected Discharge Date:                  Expected Discharge Plan:  Home/Self Care  In-House Referral:     Discharge planning Services  CM Consult  Post Acute Care Choice:    Choice offered to:     DME Arranged:    DME Agency:     HH Arranged:    [place] Agency:     Status of Service:  Completed, signed off  Medicare Important Message Given:  Yes Date Medicare IM Given:  10/21/14 Medicare IM give by:  Tomi Bamberger RN Date Additional Medicare IM Given:    Additional Medicare Important Message give by:     If discussed at [facility] of Stay Meetings, dates discussed:    Additional Comments:  Zenon Mayo, RN 10/21/2014, 1:49 PM

## 2014-10-21 NOTE — Discharge Instructions (Signed)
You were admitted for a GI bleed.  You were seen by Dr Cristina Gong, gastroenterologist, who recommends that you see him outpatient for continued monitoring of your GI bleed.  You were transfused with blood for your anemia.  You are being discharged with Protonix (Pantoprozole) to take twice daily and iron pills as well.  If you become constipated you can use Miralax daily.  You should stop taking NSAIDs (Meloxicam, Naproxen, Ibuprofen) as this can increase your risk of bleeding.  You were also evaluated by cardiology.  They recommend that you follow up outpatient as well.   A hospital follow up appointment has been made for you to see Family Medicine.  Bloody Stools Bloody stools means there is blood in your poop (stool). It is a sign that there is a problem somewhere in the digestive system. It is important for your doctor to find the cause of your bleeding, so the problem can be treated.  HOME CARE  Only take medicine as told by your doctor.  Eat foods with fiber (prunes, bran cereals).  Drink enough fluids to keep your pee (urine) clear or pale yellow.  Sit in warm water (sitz bath) for 10 to 15 minutes as told by your doctor.  Know how to take your medicines (enemas, suppositories) if advised by your doctor.  Watch for signs that you are getting better or getting worse. GET HELP RIGHT AWAY IF:   You are not getting better.  You start to get better but then get worse again.  You have new problems.  You have severe bleeding from the place where poop comes out (rectum) that does not stop.  You throw up (vomit) blood.  You feel weak or pass out (faint).  You have a fever. MAKE SURE YOU:   Understand these instructions.  Will watch your condition.  Will get help right away if you are not doing well or get worse. Document Released: 04/20/2009 Document Revised: 07/25/2011 Document Reviewed: 09/17/2010 [facility] Patient Information 2015 [place], [place]. This information is not intended  to replace advice given to you by your health care provider. Make sure you discuss any questions you have with your health care provider.

## 2014-10-21 NOTE — Progress Notes (Signed)
Utilization review completed.  

## 2014-10-21 NOTE — Progress Notes (Signed)
Donald Moreno to be D/C'd Home per MD order.  Discussed with the patient and all questions fully answered.  VSS, Skin clean, dry and intact without evidence of skin break down, no evidence of skin tears noted. IV catheter discontinued intact. Site without signs and symptoms of complications. Dressing and pressure applied.  An After Visit Summary was printed and given to the patient.  D/c education completed with patient/family including follow up instructions, medication list, d/c activities limitations if indicated, with other d/c instructions as indicated by MD - patient able to verbalize understanding, all questions fully answered.   Patient instructed to return to ED, call 911, or call MD for any changes in condition.   Patient escorted via [place], and D/C home via private auto.  [name] F 10/21/2014 3:42 PM

## 2014-10-23 LAB — CULTURE, BLOOD (ROUTINE X 2)
CULTURE: NO GROWTH
Culture: NO GROWTH

## 2014-10-24 ENCOUNTER — Other Ambulatory Visit: Payer: Medicare Other

## 2014-10-24 DIAGNOSIS — D62 Acute posthemorrhagic anemia: Secondary | ICD-10-CM | POA: Diagnosis not present

## 2014-10-24 LAB — CBC
HCT: 29.3 % — ABNORMAL LOW (ref 39.0–52.0)
HEMOGLOBIN: 9.8 g/dL — AB (ref 13.0–17.0)
MCH: 34.5 pg — AB (ref 26.0–34.0)
MCHC: 33.4 g/dL (ref 30.0–36.0)
MCV: 103.2 fL — ABNORMAL HIGH (ref 78.0–100.0)
MPV: 11 fL (ref 8.6–12.4)
Platelets: 143 10*3/uL — ABNORMAL LOW (ref 150–400)
RBC: 2.84 MIL/uL — AB (ref 4.22–5.81)
RDW: 18 % — ABNORMAL HIGH (ref 11.5–15.5)
WBC: 7.3 10*3/uL (ref 4.0–10.5)

## 2014-10-24 NOTE — Progress Notes (Signed)
CBC DONE TODAY Donald Moreno 

## 2014-10-28 ENCOUNTER — Ambulatory Visit (INDEPENDENT_AMBULATORY_CARE_PROVIDER_SITE_OTHER): Payer: Medicare Other | Admitting: Family Medicine

## 2014-10-28 VITALS — BP 130/63 | HR 78 | Temp 98.3°F | Ht 70.0 in | Wt 163.8 lb

## 2014-10-28 DIAGNOSIS — K922 Gastrointestinal hemorrhage, unspecified: Secondary | ICD-10-CM | POA: Diagnosis not present

## 2014-10-28 DIAGNOSIS — K4090 Unilateral inguinal hernia, without obstruction or gangrene, not specified as recurrent: Secondary | ICD-10-CM | POA: Diagnosis not present

## 2014-10-28 DIAGNOSIS — I10 Essential (primary) hypertension: Secondary | ICD-10-CM | POA: Diagnosis not present

## 2014-10-28 HISTORY — DX: Unilateral inguinal hernia, without obstruction or gangrene, not specified as recurrent: K40.90

## 2014-10-28 NOTE — Assessment & Plan Note (Signed)
Patient continued to take Lisinopril on discharge.  BP stable today -Patient can continue Lisinopril since not hypotensive -Follow up BPs

## 2014-10-28 NOTE — Patient Instructions (Signed)
It was a pleasure seeing you today, Donald Moreno!  Information regarding what we discussed is included in this packet.  Please make an appointment to see your primary care provider in 2 weeks.  Use Miralax to help with constipation: 1 capful mixed in 1 glass liquid once daily.  If no normal bowel movements in 3 days increase to twice daily. Take ferrous sulfate and pantoprazole.  You have an inguinal hernia.  It is stable for now and we will watch it.  BUT SEEK IMMEDIATE MEDICAL CARE IF:   Pain in the groin increases suddenly.  A bulge in the groin gets bigger suddenly and does not go down.  For men, there is sudden pain in the scrotum. Or, the size of the scrotum increases.  A bulge in the groin area becomes red or purple and is painful to touch.  You have nausea or vomiting that does not go away.  You feel your heart beating much faster than normal.  You cannot have a bowel movement or pass gas.  You develop a fever of more than 102.0 F (38.9 C).  Please feel free to call our office at [phone] if any questions or concerns arise.  Warm Regards, Ashly M. Gottschalk, DO Inguinal Hernia, Adult Muscles help keep everything in the body in its proper place. But if a weak spot in the muscles develops, something can poke through. That is called a hernia. When this happens in the lower part of the belly (abdomen), it is called an inguinal hernia. (It takes its name from a part of the body in this region called the inguinal canal.) A weak spot in the wall of muscles lets some fat or part of the small intestine bulge through. An inguinal hernia can develop at any age. Men get them more often than women. CAUSES  In adults, an inguinal hernia develops over time.  It can be triggered by:  Suddenly straining the muscles of the lower abdomen.  Lifting heavy objects.  Straining to have a bowel movement. Difficult bowel movements (constipation) can lead to this.  Constant coughing. This  may be caused by smoking or lung disease.  Being overweight.  Being pregnant.  Working at a job that requires long periods of standing or heavy lifting.  Having had an inguinal hernia before. One type can be an emergency situation. It is called a strangulated inguinal hernia. It develops if part of the small intestine slips through the weak spot and cannot get back into the abdomen. The blood supply can be cut off. If that happens, part of the intestine may die. This situation requires emergency surgery. SYMPTOMS  Often, a small inguinal hernia has no symptoms. It is found when a healthcare provider does a physical exam. Larger hernias usually have symptoms.   In adults, symptoms may include:  A lump in the groin. This is easier to see when the person is standing. It might disappear when lying down.  In men, a lump in the scrotum.  Pain or burning in the groin. This occurs especially when lifting, straining or coughing.  A dull ache or feeling of pressure in the groin.  Signs of a strangulated hernia can include:  A bulge in the groin that becomes very painful and tender to the touch.  A bulge that turns red or purple.  Fever, nausea and vomiting.  Inability to have a bowel movement or to pass gas. DIAGNOSIS  To decide if you have an inguinal hernia, a  healthcare provider will probably do a physical examination.  This will include asking questions about any symptoms you have noticed.  The healthcare provider might feel the groin area and ask you to cough. If an inguinal hernia is felt, the healthcare provider may try to slide it back into the abdomen.  Usually no other tests are needed. TREATMENT  Treatments can vary. The size of the hernia makes a difference. Options include:  Watchful waiting. This is often suggested if the hernia is small and you have had no symptoms.  No medical procedure will be done unless symptoms develop.  You will need to watch closely for  symptoms. If any occur, contact your healthcare provider right away.  Surgery. This is used if the hernia is larger or you have symptoms.  Open surgery. This is usually an outpatient procedure (you will not stay overnight in a hospital). An cut (incision) is made through the skin in the groin. The hernia is put back inside the abdomen. The weak area in the muscles is then repaired by herniorrhaphy or hernioplasty. Herniorrhaphy: in this type of surgery, the weak muscles are sewn back together. Hernioplasty: a patch or mesh is used to close the weak area in the abdominal wall.  Laparoscopy. In this procedure, a surgeon makes small incisions. A thin tube with a tiny video camera (called a laparoscope) is put into the abdomen. The surgeon repairs the hernia with mesh by looking with the video camera and using two long instruments. HOME CARE INSTRUCTIONS   After surgery to repair an inguinal hernia:  You will need to take pain medicine prescribed by your healthcare provider. Follow all directions carefully.  You will need to take care of the wound from the incision.  Your activity will be restricted for awhile. This will probably include no heavy lifting for several weeks. You also should not do anything too active for a few weeks. When you can return to work will depend on the type of job that you have.  During "watchful waiting" periods, you should:  Maintain a healthy weight.  Eat a diet high in fiber (fruits, vegetables and whole grains).  Drink plenty of fluids to avoid constipation. This means drinking enough water and other liquids to keep your urine clear or pale yellow.  Do not lift heavy objects.  Do not stand for long periods of time.  Quit smoking. This should keep you from developing a frequent cough. SEEK MEDICAL CARE IF:   A bulge develops in your groin area.  You feel pain, a burning sensation or pressure in the groin. This might be worse if you are lifting or  straining.  You develop a fever of more than 100.5 F (38.1 C). SEEK IMMEDIATE MEDICAL CARE IF:   Pain in the groin increases suddenly.  A bulge in the groin gets bigger suddenly and does not go down.  For men, there is sudden pain in the scrotum. Or, the size of the scrotum increases.  A bulge in the groin area becomes red or purple and is painful to touch.  You have nausea or vomiting that does not go away.  You feel your heart beating much faster than normal.  You cannot have a bowel movement or pass gas.  You develop a fever of more than 102.0 F (38.9 C). Document Released: 09/18/2008 Document Revised: 07/25/2011 Document Reviewed: 09/18/2008 [facility] Patient Information 2015 [place], [place]. This information is not intended to replace advice given to you by your health  care provider. Make sure you discuss any questions you have with your health care provider.  Iron Deficiency Anemia Anemia is a condition in which there are less red blood cells or hemoglobin in the blood than normal. Hemoglobin is the part of red blood cells that carries oxygen. Iron deficiency anemia is anemia caused by too little iron. It is the most common type of anemia. It may leave you tired and short of breath. CAUSES   Lack of iron in the diet.  Poor absorption of iron, as seen with intestinal disorders.  Intestinal bleeding.  Heavy periods. SIGNS AND SYMPTOMS  Mild anemia may not be noticeable. Symptoms may include:  Fatigue.  Headache.  Pale skin.  Weakness.  Tiredness.  Shortness of breath.  Dizziness.  Cold hands and feet.  Fast or irregular heartbeat. DIAGNOSIS  Diagnosis requires a thorough evaluation and physical exam by your health care provider. Blood tests are generally used to confirm iron deficiency anemia. Additional tests may be done to find the underlying cause of your anemia. These may include:  Testing for blood in the stool (fecal occult blood test).  A  procedure to see inside the colon and rectum (colonoscopy).  A procedure to see inside the esophagus and stomach (endoscopy). TREATMENT  Iron deficiency anemia is treated by correcting the cause of the deficiency. Treatment may involve:  Adding iron-rich foods to your diet.  Taking iron supplements. Pregnant or breastfeeding women need to take extra iron because their normal diet usually does not provide the required amount.  Taking vitamins. Vitamin C improves the absorption of iron. Your health care provider may recommend that you take your iron tablets with a glass of orange juice or vitamin C supplement.  Medicines to make heavy menstrual flow lighter.  Surgery. HOME CARE INSTRUCTIONS   Take iron as directed by your health care provider.  If you cannot tolerate taking iron supplements by mouth, talk to your health care provider about taking them through a vein (intravenously) or an injection into a muscle.  For the best iron absorption, iron supplements should be taken on an empty stomach. If you cannot tolerate them on an empty stomach, you may need to take them with food.  Do not drink milk or take antacids at the same time as your iron supplements. Milk and antacids may interfere with the absorption of iron.  Iron supplements can cause constipation. Make sure to include fiber in your diet to prevent constipation. A stool softener may also be recommended.  Take vitamins as directed by your health care provider.  Eat a diet rich in iron. Foods high in iron include liver, lean beef, whole-grain bread, eggs, dried fruit, and dark green leafy vegetables. SEEK IMMEDIATE MEDICAL CARE IF:   You faint. If this happens, do not drive. Call your local emergency services (911 in [place] if no other help is available.  You have chest pain.  You feel nauseous or vomit.  You have severe or increased shortness of breath with activity.  You feel weak.  You have a rapid heartbeat.  You  have unexplained sweating.  You become light-headed when getting up from a chair or bed. MAKE SURE YOU:   Understand these instructions.  Will watch your condition.  Will get help right away if you are not doing well or get worse. Document Released: 04/29/2000 Document Revised: 05/07/2013 Document Reviewed: 01/07/2013 [facility] Patient Information 2015 [place], [place]. This information is not intended to replace advice given to you by  your health care provider. Make sure you discuss any questions you have with your health care provider.

## 2014-10-28 NOTE — Progress Notes (Addendum)
Patient ID: Donald Moreno, male   DOB: Jan 03, 1941, 74 y.o.   MRN: [id number]    Subjective: CC: GI bleed/symptomatic anemia HPI: Patient is a 73 y.o. male presenting to clinic today for hospital follow up. Concerns today include:  GI bleed: Patient reports he has been doing well since discharge from hospital.  Denies dizziness, SOB, CP, fatigue, melena.  He has not had any more bloody bowel movements.  He has follow up with GI on 10/31/14.  He has not been taking protonix or iron, as he is waiting for his mail order to send this to him.  He expects that it will arrive in the next day or two.  He reports that he discussed CBC with Dr Gerarda Fraction.    HTN: Patient reports that he continued to take his Lisinopril at discharge from hospital.  He reports that he has been doing well on it.  Denies CP, SOB, vision changes, dizziness/ lightheadedness.    Hernia Patient reports that he developed a hernia before hospitalization.  He reports that it is non tender but "swells occasionally".  He reports that he was moving some trees that he cut down about 3 weeks ago when he first noticed it.  Denies abdominal pain, chills, fevers, nausea or vomiting.  Endorses constipation.  Reports that has been stooling about every other day.  Normally stools daily but has been having a BM about every other day.  Not taking Miralax but has in the past.  Does not feel hernia is worse.  Denies straining.   FamHx and MedHx updated.  Please see EMR.  ROS: All other systems reviewed and are negative.  Objective: Office vital signs reviewed. BP 130/63 mmHg  Pulse 78  Temp(Src) 98.3 F (36.8 C) (Oral)  Ht 5\' 10"  (1.778 m)  Wt 163 lb 12.8 oz (74.299 kg)  BMI 23.50 kg/m2  Physical Examination:  General: Awake, alert, well nourished male, NAD HEENT: Normal, EOMI, MMM Cardio: RRR, S1S2 heard, no murmurs appreciated Pulm: CTAB, no wheezes, rhonchi or rales GI: soft, NT/ND but full feeling,+BS x4 GU: femoral pulses palpable,  R inguinal hernia present that is reducible and non-TTP Extremities: WWP, No edema, cyanosis or clubbing; +2 pulses bilaterally MSK: Normal gait and station Skin: dry, intact, no rashes or lesions  Assessment: 74 y.o. male with upper GI bleed, HTN, inguinal hernia  Plan: See Problem List and After Visit Summary   Janora Norlander, DO PGY-1, [place]

## 2014-10-28 NOTE — Assessment & Plan Note (Addendum)
CBC reviewed today.  Hgb improved since discharge. No melena, hematochezia. -Iron supplementation and Protonix BID -Patient to follow up with GI 10/31/14

## 2014-10-28 NOTE — Assessment & Plan Note (Signed)
Reducible.  Nontender.  Patient does not want surgery if can be avoided. -Recommend that this be monitored -Return precautions reviewed with patient and handout given, see AVS -Consider referral to surgery if gets larger/painful/red flags -f/u with PCP in 2-4 weeks

## 2014-10-31 DIAGNOSIS — K7460 Unspecified cirrhosis of liver: Secondary | ICD-10-CM | POA: Diagnosis not present

## 2014-10-31 DIAGNOSIS — K5900 Constipation, unspecified: Secondary | ICD-10-CM | POA: Diagnosis not present

## 2014-10-31 DIAGNOSIS — D649 Anemia, unspecified: Secondary | ICD-10-CM | POA: Diagnosis not present

## 2014-10-31 DIAGNOSIS — Z86010 Personal history of colonic polyps: Secondary | ICD-10-CM | POA: Diagnosis not present

## 2014-10-31 DIAGNOSIS — K922 Gastrointestinal hemorrhage, unspecified: Secondary | ICD-10-CM | POA: Diagnosis not present

## 2014-11-14 DIAGNOSIS — Z86010 Personal history of colonic polyps: Secondary | ICD-10-CM | POA: Diagnosis not present

## 2014-11-14 DIAGNOSIS — K766 Portal hypertension: Secondary | ICD-10-CM | POA: Diagnosis not present

## 2014-11-14 DIAGNOSIS — D509 Iron deficiency anemia, unspecified: Secondary | ICD-10-CM | POA: Diagnosis not present

## 2014-11-14 DIAGNOSIS — K5520 Angiodysplasia of colon without hemorrhage: Secondary | ICD-10-CM | POA: Diagnosis not present

## 2014-11-14 DIAGNOSIS — I8500 Esophageal varices without bleeding: Secondary | ICD-10-CM | POA: Diagnosis not present

## 2014-11-14 DIAGNOSIS — K3189 Other diseases of stomach and duodenum: Secondary | ICD-10-CM | POA: Diagnosis not present

## 2014-11-14 DIAGNOSIS — K921 Melena: Secondary | ICD-10-CM | POA: Diagnosis not present

## 2014-11-19 ENCOUNTER — Ambulatory Visit (INDEPENDENT_AMBULATORY_CARE_PROVIDER_SITE_OTHER): Payer: Medicare Other | Admitting: Family Medicine

## 2014-11-19 ENCOUNTER — Encounter: Payer: Self-pay | Admitting: Family Medicine

## 2014-11-19 ENCOUNTER — Ambulatory Visit: Payer: Medicare Other | Admitting: Family Medicine

## 2014-11-19 VITALS — BP 155/62 | HR 78 | Temp 98.3°F | Ht 69.0 in | Wt 163.5 lb

## 2014-11-19 DIAGNOSIS — K4091 Unilateral inguinal hernia, without obstruction or gangrene, recurrent: Secondary | ICD-10-CM

## 2014-11-19 DIAGNOSIS — R140 Abdominal distension (gaseous): Secondary | ICD-10-CM

## 2014-11-19 DIAGNOSIS — R1030 Lower abdominal pain, unspecified: Secondary | ICD-10-CM | POA: Diagnosis not present

## 2014-11-19 NOTE — Assessment & Plan Note (Signed)
Physical exam on discharge summary reports abdomen as being flat in a patient with BMI 24 Reports more fullness and looks like he is pregnant Having some tenderness in lower quadrant.  Normal BM two hours ago with no N/V. Hearing bowel sounds.  - CT ab/pelvis  - BMP  - discussed with Dr. Nori Riis

## 2014-11-19 NOTE — Patient Instructions (Signed)
Thank you for coming in,   I will call you with the results from today and the ct scan.   Please bring all of your medications with you to each visit.    Please feel free to call with any questions or concerns at any time, at [phone]. --Dr. Raeford Razor

## 2014-11-19 NOTE — Assessment & Plan Note (Signed)
Reducible, but having increased pain with walking. Has to place his hand on hernia when he walks to reduce pain  - referral to gen surgery  - discussed with Dr. Nori Riis.

## 2014-11-19 NOTE — Progress Notes (Signed)
   Subjective:    Patient ID: Donald Moreno, male    DOB: [birth year], 74 y.o.   MRN: [id number]  HPI  Donald Moreno is here for hernia f/u.   Hernia: he can't walk without placing his hand on his right groin. This helps the relieve the pain.  The pain is worse if he doesn't hold pressure.  Reports normal bowel movements.  Normal urination.  No nausea, vomiting, constipation, diarrhea, fevers, or night sweats.  The hernia developed before his recent hospitalization.  First about 1.5 months ago. Started getting worse about 3 weeks ago.   Current Outpatient Prescriptions on File Prior to Visit  Medication Sig Dispense Refill  . albuterol (PROAIR HFA) 108 (90 BASE) MCG/ACT inhaler take 2 puffs every 6 hours if needed for wheezing 8.5 g 5  . allopurinol (ZYLOPRIM) 100 MG tablet Take 1 tablet (100 mg total) by mouth daily. Add to 300mg  tablet for combined dose of 400mg . 60 tablet 3  . allopurinol (ZYLOPRIM) 300 MG tablet Take 1 tablet (300 mg total) by mouth daily. 60 tablet 3  . aspirin 81 MG tablet Take 81 mg by mouth daily.    . colchicine (COLCRYS) 0.6 MG tablet Take 2 tablets(1.2mg  total) at first sign of flare. One hour later take 1 tablet (0.6mg ). (Patient taking differently: Take 0.6-1.2 mg by mouth as needed (gout flare). Take 2 tablets(1.2mg  total) at first sign of flare. One hour later take 1 tablet (0.6mg ).) 60 tablet 3  . ferrous sulfate 325 (65 FE) MG tablet Take 1 tablet (325 mg total) by mouth 2 (two) times daily with a meal. 60 tablet 3  . Fluticasone-Salmeterol (ADVAIR DISKUS) 500-50 MCG/DOSE AEPB Inhale 1 puff INTO THE LUNGS twice a day 60 each 6  . lisinopril (PRINIVIL,ZESTRIL) 40 MG tablet Take 40 mg by mouth daily.    . montelukast (SINGULAIR) 10 MG tablet Take 1 tablet (10 mg total) by mouth at bedtime. 90 tablet 6  . pantoprazole (PROTONIX) 40 MG tablet Take 1 tablet (40 mg total) by mouth 2 (two) times daily. 60 tablet 0  . sucralfate (CARAFATE) 1 G tablet Take 1 tablet (1 g  total) by mouth 4 (four) times daily -  with meals and at bedtime. 28 tablet 0   No current facility-administered medications on file prior to visit.   Review of Systems See HPI     Objective:   Physical Exam BP 155/62 mmHg  Pulse 78  Temp(Src) 98.3 F (36.8 C) (Oral)  Ht 5\' 9"  (1.753 m)  Wt 163 lb 8 oz (74.163 kg)  BMI 24.13 kg/m2 Gen: NAD, alert, cooperative with exam Abd: S, some tenderness upon palpation of lower quadrant, distended, BS present, no guarding or organomegaly,  GU: inguinal hernia present on right, able to reduce.  Skin: no rashes, normal turgor  Neuro: no gross deficits.      Assessment & Plan:

## 2014-11-20 ENCOUNTER — Encounter: Payer: Self-pay | Admitting: Family Medicine

## 2014-11-20 LAB — BASIC METABOLIC PANEL WITH GFR
BUN: 12 mg/dL (ref 6–23)
CO2: 25 mEq/L (ref 19–32)
CREATININE: 0.88 mg/dL (ref 0.50–1.35)
Calcium: 9 mg/dL (ref 8.4–10.5)
Chloride: 109 mEq/L (ref 96–112)
GFR, EST NON AFRICAN AMERICAN: 86 mL/min
GFR, Est African American: >89 mL/min
Glucose, Bld: 100 mg/dL — ABNORMAL HIGH (ref 70–99)
POTASSIUM: 4.1 meq/L (ref 3.5–5.3)
Sodium: 145 mEq/L (ref 135–145)

## 2014-11-21 ENCOUNTER — Other Ambulatory Visit: Payer: Self-pay | Admitting: Family Medicine

## 2014-11-21 ENCOUNTER — Telehealth: Payer: Self-pay | Admitting: Family Medicine

## 2014-11-21 ENCOUNTER — Telehealth: Payer: Self-pay

## 2014-11-21 DIAGNOSIS — Z8719 Personal history of other diseases of the digestive system: Secondary | ICD-10-CM

## 2014-11-21 DIAGNOSIS — R140 Abdominal distension (gaseous): Secondary | ICD-10-CM

## 2014-11-21 NOTE — Telephone Encounter (Signed)
Patient presents to clinic to find out the status of his CT scan. It was approved for next Friday.  I discussed he symptoms and story with Dr. Lindell Noe. Upon chart review, he had a CT scan on June 2 that showed cirrhosis. He has had abdominal distention since being discharged from the hospital.  He denies any abdominal pain, fevers or chills. Still is having bowel movements. Will order [place] complete abdomen as well as US paracentesis so if there is ascites present then labs can be collected and fluid drained.  Patient is in agreement with this plan.   Rosemarie Ax, MD PGY-3, [facility] 11/21/2014, 12:10 PM

## 2014-11-24 ENCOUNTER — Ambulatory Visit (HOSPITAL_COMMUNITY)
Admission: RE | Admit: 2014-11-24 | Discharge: 2014-11-24 | Disposition: A | Discharge status: Home / Self Care | Payer: Medicare Other | Source: Ambulatory Visit | Attending: Family Medicine | Admitting: Family Medicine

## 2014-11-24 DIAGNOSIS — Z8719 Personal history of other diseases of the digestive system: Secondary | ICD-10-CM

## 2014-11-24 DIAGNOSIS — R140 Abdominal distension (gaseous): Secondary | ICD-10-CM

## 2014-11-24 DIAGNOSIS — K7460 Unspecified cirrhosis of liver: Secondary | ICD-10-CM | POA: Diagnosis not present

## 2014-11-24 DIAGNOSIS — R188 Other ascites: Secondary | ICD-10-CM | POA: Diagnosis not present

## 2014-11-24 LAB — LACTATE DEHYDROGENASE, PLEURAL OR PERITONEAL FLUID: LD FL: 45 U/L — AB (ref 3–23)

## 2014-11-24 LAB — BODY FLUID CELL COUNT WITH DIFFERENTIAL
EOS FL: 0 %
LYMPHS FL: 49 %
Monocyte-Macrophage-Serous Fluid: 39 % — ABNORMAL LOW (ref 50–90)
Neutrophil Count, Fluid: 12 % (ref 0–25)
Other Cells, Fluid: 0 %
WBC FLUID: 324 uL (ref 0–1000)

## 2014-11-24 LAB — AMYLASE, PERITONEAL FLUID: AMYLASE, PERITONEAL FLUID: 12 U/L

## 2014-11-24 LAB — ALBUMIN, FLUID (OTHER): Albumin, Fluid: 1.3 g/dL

## 2014-11-24 LAB — PH, BODY FLUID: pH, Fluid: 8

## 2014-11-24 LAB — GLUCOSE, SEROUS FLUID: GLUCOSE FL: 110 mg/dL

## 2014-11-24 LAB — GRAM STAIN

## 2014-11-24 LAB — PROTEIN, BODY FLUID: Total protein, fluid: <3 g/dL

## 2014-11-24 MED ORDER — LIDOCAINE HCL (PF) 1 % IJ SOLN
INTRAMUSCULAR | Status: AC
Start: 2014-11-24 — End: 2014-11-24
  Filled 2014-11-24: qty 10

## 2014-11-27 ENCOUNTER — Other Ambulatory Visit: Payer: Self-pay | Admitting: Gastroenterology

## 2014-11-27 DIAGNOSIS — K7460 Unspecified cirrhosis of liver: Secondary | ICD-10-CM | POA: Diagnosis not present

## 2014-11-27 DIAGNOSIS — K769 Liver disease, unspecified: Secondary | ICD-10-CM

## 2014-11-27 DIAGNOSIS — R188 Other ascites: Secondary | ICD-10-CM | POA: Diagnosis not present

## 2014-11-27 DIAGNOSIS — D649 Anemia, unspecified: Secondary | ICD-10-CM | POA: Diagnosis not present

## 2014-11-27 DIAGNOSIS — I8500 Esophageal varices without bleeding: Secondary | ICD-10-CM | POA: Diagnosis not present

## 2014-11-28 ENCOUNTER — Ambulatory Visit (HOSPITAL_COMMUNITY): Payer: Medicare Other

## 2014-11-28 LAB — TOTAL BILIRUBIN, BODY FLUID: TOTBILIFLUID: 0.3 mg/dL

## 2014-11-29 LAB — CULTURE, BODY FLUID-BOTTLE: CULTURE: NO GROWTH

## 2014-11-29 LAB — CULTURE, BODY FLUID W GRAM STAIN -BOTTLE

## 2014-12-09 ENCOUNTER — Other Ambulatory Visit: Payer: Medicare Other

## 2014-12-09 ENCOUNTER — Other Ambulatory Visit: Payer: Self-pay | Admitting: Surgery

## 2014-12-09 DIAGNOSIS — K4090 Unilateral inguinal hernia, without obstruction or gangrene, not specified as recurrent: Secondary | ICD-10-CM | POA: Diagnosis not present

## 2014-12-10 ENCOUNTER — Ambulatory Visit
Admission: RE | Admit: 2014-12-10 | Discharge: 2014-12-10 | Disposition: A | Discharge status: Home / Self Care | Payer: Medicare Other | Source: Ambulatory Visit | Attending: Gastroenterology | Admitting: Gastroenterology

## 2014-12-10 DIAGNOSIS — K7689 Other specified diseases of liver: Secondary | ICD-10-CM | POA: Diagnosis not present

## 2014-12-10 DIAGNOSIS — K7460 Unspecified cirrhosis of liver: Secondary | ICD-10-CM

## 2014-12-10 DIAGNOSIS — R188 Other ascites: Secondary | ICD-10-CM | POA: Diagnosis not present

## 2014-12-10 DIAGNOSIS — K769 Liver disease, unspecified: Secondary | ICD-10-CM

## 2014-12-10 MED ORDER — GADOXETATE DISODIUM 0.25 MMOL/ML IV SOLN
7.0000 mL | Freq: Once | INTRAVENOUS | Status: AC | PRN
  Administered 2014-12-10: 7 mL via INTRAVENOUS

## 2014-12-24 ENCOUNTER — Other Ambulatory Visit (HOSPITAL_COMMUNITY): Payer: Self-pay | Admitting: Gastroenterology

## 2014-12-24 DIAGNOSIS — R188 Other ascites: Secondary | ICD-10-CM

## 2014-12-29 ENCOUNTER — Ambulatory Visit (HOSPITAL_COMMUNITY): Payer: Medicare Other

## 2014-12-30 DIAGNOSIS — H40013 Open angle with borderline findings, low risk, bilateral: Secondary | ICD-10-CM | POA: Diagnosis not present

## 2014-12-30 DIAGNOSIS — H25813 Combined forms of age-related cataract, bilateral: Secondary | ICD-10-CM | POA: Diagnosis not present

## 2014-12-31 ENCOUNTER — Ambulatory Visit (HOSPITAL_COMMUNITY)
Admission: RE | Admit: 2014-12-31 | Discharge: 2014-12-31 | Disposition: A | Discharge status: Home / Self Care | Payer: Medicare Other | Source: Ambulatory Visit | Attending: Gastroenterology | Admitting: Gastroenterology

## 2014-12-31 ENCOUNTER — Encounter (HOSPITAL_COMMUNITY)
Admission: RE | Admit: 2014-12-31 | Discharge: 2014-12-31 | Disposition: A | Discharge status: Home / Self Care | Payer: Medicare Other | Source: Ambulatory Visit | Attending: Gastroenterology | Admitting: Gastroenterology

## 2014-12-31 ENCOUNTER — Encounter (HOSPITAL_COMMUNITY): Payer: Self-pay

## 2014-12-31 DIAGNOSIS — R188 Other ascites: Secondary | ICD-10-CM | POA: Insufficient documentation

## 2014-12-31 DIAGNOSIS — K7460 Unspecified cirrhosis of liver: Secondary | ICD-10-CM | POA: Insufficient documentation

## 2014-12-31 MED ORDER — SODIUM CHLORIDE 0.9 % IV SOLN
Freq: Once | INTRAVENOUS | Status: AC
  Administered 2014-12-31: 12:00:00 via INTRAVENOUS

## 2014-12-31 MED ORDER — ALBUMIN HUMAN 25 % IV SOLN
50.0000 g | Freq: Once | INTRAVENOUS | Status: AC
  Administered 2014-12-31: 50 g via INTRAVENOUS
  Filled 2014-12-31: qty 200

## 2014-12-31 NOTE — Procedures (Signed)
Successful US guided paracentesis from [facility].  Yielded 5.9 liters of serous fluid.  No immediate complications.  Pt tolerated well.  Post procedure IV Albumin was ordered.  Specimen was not sent for labs.  Tsosie Billing D PA-C 12/31/2014 10:21 AM

## 2014-12-31 NOTE — Discharge Instructions (Signed)
Paracentesis °Paracentesis is a procedure used to remove excess fluid from the belly (abdomen). Excess fluid in the belly is called ascites. Excess fluid can be the result of certain conditions, such as infection, inflammation, abdominal injury, heart failure, chronic scarring of the liver (cirrhosis), or cancer. The excess fluid is removed using a needle inserted through the skin and tissue into the abdomen.  °A paracentesis may be done to: °· Determine the cause of the excess fluid through examination of the fluid. °· Relieve symptoms of shortness of breath or pain caused by the excess fluid. °· Determine presence of bleeding after an abdominal injury. °LET YOUR CAREGIVERS KNOW ABOUT: °· Allergies. °· Medications taken including herbs, eye drops, over-the-counter medications, and creams. °· Use of steroids (by mouth or creams). °· Previous problems with anesthetics or numbing medicine. °· Possibility of pregnancy, if this applies. °· History of blood clots (thrombophlebitis). °· History of bleeding or blood problems. °· Previous surgery. °· Other health problems. °RISKS AND COMPLICATIONS °· Injury to an abdominal organ, such as the bowel (large intestine), liver, spleen, or bladder. °· Possible infection. °· Bleeding. °· Low blood pressure (hypotension). °BEFORE THE PROCEDURE °This is a procedure that can be done as an outpatient. Confirm the time that you need to arrive for your procedure. A blood sample may be done to determine your blood clotting time. The presence of a severe bleeding disorder (coagulopathy) which cannot be promptly corrected may make this procedure inadvisable. You may be asked to urinate. °PROCEDURE °The procedure will take about 30 minutes. This time will vary depending on the amount of fluid that is removed. You may be asked to lie on your back with your head elevated. An area on your abdomen will be cleansed. A numbing medicine may then be injected (local anesthesia) into the skin and  tissue. A needle is inserted through your abdominal skin and tissues until it is positioned in your abdomen. You may feel pressure or slight pain as the needle is positioned into the abdomen. Fluid is removed from the abdomen through the needle. Tell your caregiver if you feel dizzy or lightheaded. The needle is withdrawn once the desired amount of fluid has been removed. A sample of the fluid may be sent for examination.  °AFTER THE PROCEDURE °Your recovery will be assessed and monitored. If there are no problems, as an outpatient, you should be able to go home shortly after the procedure. There may be a very limited amount of clear fluid draining from the needle insertion site over the next 2 days. Confirm with your caregiver as to the expected amount of drainage. °Obtaining the Test Results °It is your responsibility to obtain your test results. Do not assume everything is normal if you have not heard from your caregiver or the medical facility. It is important for you to follow up on all of your test results. °HOME CARE INSTRUCTIONS  °· You may resume normal diet and activities as directed or allowed. °· Only take over-the-counter or prescription medicines for pain, discomfort, or fever as directed by your caregiver. °SEEK IMMEDIATE MEDICAL CARE IF: °· You develop shortness of breath or chest pain. °· You develop increasing pain, discomfort, or swelling in your abdomen. °· You develop new drainage or pus coming from site where fluid was removed. °· You develop swelling or increased redness from site where fluid was removed. °· You develop an unexplained temperature of 102° F (38.9° C) or above. °Document Released: 11/15/2004 Document Revised: 07/25/2011 Document   Reviewed: 12/22/2008 [employer] Patient Information 2015 Donald More[place], [place]. This information is not intended to replace advice given to you by your health care provider. Make sure you discuss any questions you have with your health care  provider.       Albumin Albumin tests are done as a screen for a liver disorder or kidney disease or to check nutritional status, especially in hospitalized patients (prealbumin is sometimes used instead of albumin in this situation). Albumin is the most plentiful protein in the blood plasma. It keeps fluid from leaking out of blood vessels; nourishes tissues; and transports hormones, vitamins, drugs, and ions like calcium throughout the body. Albumin is made in the liver and is extremely sensitive to liver damage. The concentration of albumin drops when the liver is damaged, with kidney disease (nephrotic syndrome), when a person is malnourished, if a person experiences inflammation in the body, or with shock. Albumin increases when a person is dehydrated. PREPARATION FOR TEST No preparation or fasting is necessary. A blood sample is taken by a needle from a vein. Tell the person doing the test if you are pregnant. NORMAL FINDINGS  Adult/Elderly  Total Protein: 6.4-8.3 g/dL or 64-83g/L (SI units)  Albumin; 3.5-5 g/dL or 35-50 g/L (SI units)  Globulin 2.3-3.4 g/dL  Alpha1 globulin: 0.1-3 g/dL or 1-3 g/L (SI units)  Alpha2 globulin: 0.6-1 g/dL or6-10 g/L (SI units)  Beta globulin: 0.7-1.1 g/dL or 7-11 g/L (SI units) Children  Total protein.  Premature infant: 4.2-7.6 g/L  Newborn: 4.6-7.4 g/dL  Infant: 6-6.7 g/L  Albumin.  Premature infant: 3-4.2 g/dL  Newborn: 3.5-5.4 g/dL  Infant: 4.4-5.4 g/dL  Child: 4-5.9 g/dL Ranges for normal findings may vary among different laboratories and hospitals. You should always check with your doctor after having lab work or other tests done to discuss the meaning of your test results and whether your values are considered within normal limits. MEANING OF TEST  Your caregiver will go over the test results with you and discuss the importance and meaning of your results, as well as treatment options and the need for additional tests if  necessary. OBTAINING THE TEST RESULTS It is your responsibility to obtain your test results. Ask the lab or department performing the test when and how you will get your results. Document Released: 05/24/2004 Document Revised: 07/25/2011 Document Reviewed: 04/06/2008 [facility] Patient Information 2015 [place], [place]. This information is not intended to replace advice given to you by your health care provider. Make sure you discuss any questions you have with your health care provider.

## 2015-01-02 DIAGNOSIS — K4090 Unilateral inguinal hernia, without obstruction or gangrene, not specified as recurrent: Secondary | ICD-10-CM | POA: Diagnosis not present

## 2015-01-02 DIAGNOSIS — R188 Other ascites: Secondary | ICD-10-CM | POA: Diagnosis not present

## 2015-01-06 DIAGNOSIS — R188 Other ascites: Secondary | ICD-10-CM | POA: Diagnosis not present

## 2015-01-06 DIAGNOSIS — K7460 Unspecified cirrhosis of liver: Secondary | ICD-10-CM | POA: Diagnosis not present

## 2015-01-06 DIAGNOSIS — K769 Liver disease, unspecified: Secondary | ICD-10-CM | POA: Diagnosis not present

## 2015-01-24 ENCOUNTER — Emergency Department (HOSPITAL_COMMUNITY)
Admission: EM | Admit: 2015-01-24 | Discharge: 2015-01-24 | Disposition: A | Discharge status: Home / Self Care | Payer: Medicare Other | Attending: Emergency Medicine | Admitting: Emergency Medicine

## 2015-01-24 ENCOUNTER — Emergency Department (HOSPITAL_COMMUNITY): Payer: Medicare Other

## 2015-01-24 ENCOUNTER — Encounter (HOSPITAL_COMMUNITY): Payer: Self-pay | Admitting: Emergency Medicine

## 2015-01-24 DIAGNOSIS — M1990 Unspecified osteoarthritis, unspecified site: Secondary | ICD-10-CM | POA: Diagnosis not present

## 2015-01-24 DIAGNOSIS — Z79899 Other long term (current) drug therapy: Secondary | ICD-10-CM | POA: Diagnosis not present

## 2015-01-24 DIAGNOSIS — J45909 Unspecified asthma, uncomplicated: Secondary | ICD-10-CM | POA: Insufficient documentation

## 2015-01-24 DIAGNOSIS — S3992XA Unspecified injury of lower back, initial encounter: Secondary | ICD-10-CM | POA: Diagnosis present

## 2015-01-24 DIAGNOSIS — S2231XA Fracture of one rib, right side, initial encounter for closed fracture: Secondary | ICD-10-CM | POA: Diagnosis not present

## 2015-01-24 DIAGNOSIS — Y998 Other external cause status: Secondary | ICD-10-CM | POA: Insufficient documentation

## 2015-01-24 DIAGNOSIS — M549 Dorsalgia, unspecified: Secondary | ICD-10-CM | POA: Diagnosis not present

## 2015-01-24 DIAGNOSIS — I10 Essential (primary) hypertension: Secondary | ICD-10-CM | POA: Diagnosis not present

## 2015-01-24 DIAGNOSIS — X58XXXA Exposure to other specified factors, initial encounter: Secondary | ICD-10-CM | POA: Diagnosis not present

## 2015-01-24 DIAGNOSIS — Y9289 Other specified places as the place of occurrence of the external cause: Secondary | ICD-10-CM | POA: Diagnosis not present

## 2015-01-24 DIAGNOSIS — M109 Gout, unspecified: Secondary | ICD-10-CM | POA: Insufficient documentation

## 2015-01-24 DIAGNOSIS — S2241XA Multiple fractures of ribs, right side, initial encounter for closed fracture: Secondary | ICD-10-CM | POA: Diagnosis not present

## 2015-01-24 DIAGNOSIS — Z7982 Long term (current) use of aspirin: Secondary | ICD-10-CM | POA: Insufficient documentation

## 2015-01-24 DIAGNOSIS — R188 Other ascites: Secondary | ICD-10-CM | POA: Diagnosis not present

## 2015-01-24 DIAGNOSIS — Y9389 Activity, other specified: Secondary | ICD-10-CM | POA: Insufficient documentation

## 2015-01-24 LAB — CBC WITH DIFFERENTIAL/PLATELET
Basophils Absolute: 0.1 10*3/uL (ref 0.0–0.1)
Basophils Relative: 1 % (ref 0–1)
Eosinophils Absolute: 0.2 10*3/uL (ref 0.0–0.7)
Eosinophils Relative: 4 % (ref 0–5)
HEMATOCRIT: 34.9 % — AB (ref 39.0–52.0)
Hemoglobin: 12.1 g/dL — ABNORMAL LOW (ref 13.0–17.0)
LYMPHS ABS: 2 10*3/uL (ref 0.7–4.0)
LYMPHS PCT: 37 % (ref 12–46)
MCH: 33.8 pg (ref 26.0–34.0)
MCHC: 34.7 g/dL (ref 30.0–36.0)
MCV: 97.5 fL (ref 78.0–100.0)
Monocytes Absolute: 0.4 10*3/uL (ref 0.1–1.0)
Monocytes Relative: 7 % (ref 3–12)
NEUTROS ABS: 2.8 10*3/uL (ref 1.7–7.7)
NEUTROS PCT: 51 % (ref 43–77)
Platelets: 104 10*3/uL — ABNORMAL LOW (ref 150–400)
RBC: 3.58 MIL/uL — AB (ref 4.22–5.81)
RDW: 13.5 % (ref 11.5–15.5)
WBC: 5.4 10*3/uL (ref 4.0–10.5)

## 2015-01-24 LAB — COMPREHENSIVE METABOLIC PANEL
ALT: 15 U/L — AB (ref 17–63)
AST: 30 U/L (ref 15–41)
Albumin: 3.3 g/dL — ABNORMAL LOW (ref 3.5–5.0)
Alkaline Phosphatase: 69 U/L (ref 38–126)
Anion gap: 9 (ref 5–15)
BUN: 21 mg/dL — ABNORMAL HIGH (ref 6–20)
CALCIUM: 9.3 mg/dL (ref 8.9–10.3)
CO2: 21 mmol/L — AB (ref 22–32)
Chloride: 104 mmol/L (ref 101–111)
Creatinine, Ser: 1.37 mg/dL — ABNORMAL HIGH (ref 0.61–1.24)
GFR calc Af Amer: 58 mL/min — ABNORMAL LOW (ref >60)
GFR calc non Af Amer: 50 mL/min — ABNORMAL LOW (ref >60)
Glucose, Bld: 115 mg/dL — ABNORMAL HIGH (ref 65–99)
Potassium: 4.2 mmol/L (ref 3.5–5.1)
SODIUM: 134 mmol/L — AB (ref 135–145)
TOTAL PROTEIN: 7.6 g/dL (ref 6.5–8.1)
Total Bilirubin: 1 mg/dL (ref 0.3–1.2)

## 2015-01-24 MED ORDER — HYDROCODONE-ACETAMINOPHEN 5-325 MG PO TABS
1.0000 | ORAL_TABLET | Freq: Once | ORAL | Status: AC
  Administered 2015-01-24: 1 via ORAL
  Filled 2015-01-24: qty 1

## 2015-01-24 MED ORDER — SODIUM CHLORIDE 0.9 % IV BOLUS (SEPSIS)
1000.0000 mL | Freq: Once | INTRAVENOUS | Status: AC
Start: 2015-01-24 — End: 2015-01-24
  Administered 2015-01-24: 1000 mL via INTRAVENOUS

## 2015-01-24 MED ORDER — IOHEXOL 350 MG/ML SOLN
100.0000 mL | Freq: Once | INTRAVENOUS | Status: AC | PRN
  Administered 2015-01-24: 100 mL via INTRAVENOUS

## 2015-01-24 MED ORDER — TRAMADOL HCL 50 MG PO TABS: 50.0000 mg | ORAL_TABLET | Freq: Four times a day (QID) | ORAL | Status: DC | PRN

## 2015-01-24 NOTE — ED Notes (Signed)
Pt. Stated, I've had some back pain for about 2 weeks.

## 2015-01-24 NOTE — ED Notes (Signed)
Patient transported to X-ray 

## 2015-01-24 NOTE — ED Provider Notes (Signed)
CSN: [id number]     Arrival date & time 01/24/15  [id number] History   First MD Initiated Contact with Patient 01/24/15 [phone]     Chief Complaint  Patient presents with  . Back Pain     (Consider location/radiation/quality/duration/timing/severity/associated sxs/prior Treatment) Patient is a 74 y.o. male presenting with back pain. The history is provided by the patient (The patient complains of mid back pain for 2 weeks. No history of trauma).  Back Pain Location:  Generalized (across upper back) Quality:  Aching Radiates to:  Does not radiate Pain severity:  Moderate Onset quality:  Gradual Timing:  Constant Progression:  Unchanged Associated symptoms: no abdominal pain, no chest pain and no headaches     Past Medical History  Diagnosis Date  . Arthritis   . Gout   . Asthma   . Hypertension   . ETOH abuse    History reviewed. No pertinent past surgical history. Family History  Problem Relation Age of Onset  . Asthma Sister   . Heart disease Sister     multiple stents.  . Asthma Brother   . Other Mother     died when pt was only 89 - unknown cause.  . Other Father     deceased.   Social History  Substance Use Topics  . Smoking status: Never Smoker   . Smokeless tobacco: Current User  . Alcohol Use: 1.2 oz/week    2 Cans of beer per week    Review of Systems  Constitutional: Negative for appetite change and fatigue.  HENT: Negative for congestion, ear discharge and sinus pressure.   Eyes: Negative for discharge.  Respiratory: Negative for cough.   Cardiovascular: Negative for chest pain.  Gastrointestinal: Negative for abdominal pain and diarrhea.  Genitourinary: Negative for frequency and hematuria.  Musculoskeletal: Positive for back pain.  Skin: Negative for rash.  Neurological: Negative for seizures and headaches.  Psychiatric/Behavioral: Negative for hallucinations.      Allergies  Review of patient's allergies indicates no known allergies.  Home  Medications   Prior to Admission medications   Medication Sig Start Date End Date Taking? Authorizing Provider  albuterol (PROAIR HFA) 108 (90 BASE) MCG/ACT inhaler take 2 puffs every 6 hours if needed for wheezing 07/05/13   Kinnie Feil, MD  allopurinol (ZYLOPRIM) 100 MG tablet Take 1 tablet (100 mg total) by mouth daily. Add to 300mg  tablet for combined dose of 400mg . 07/30/14   Katheren Shams, DO  allopurinol (ZYLOPRIM) 300 MG tablet Take 1 tablet (300 mg total) by mouth daily. 07/30/14   Katheren Shams, DO  aspirin 81 MG tablet Take 81 mg by mouth daily.    Historical Provider, MD  colchicine (COLCRYS) 0.6 MG tablet Take 2 tablets(1.2mg  total) at first sign of flare. One hour later take 1 tablet (0.6mg ). Patient taking differently: Take 0.6-1.2 mg by mouth as needed (gout flare). Take 2 tablets(1.2mg  total) at first sign of flare. One hour later take 1 tablet (0.6mg ). 07/30/14   Katheren Shams, DO  ferrous sulfate 325 (65 FE) MG tablet Take 1 tablet (325 mg total) by mouth 2 (two) times daily with a meal. 10/21/14   Ashly Windell Moulding, DO  Fluticasone-Salmeterol (ADVAIR DISKUS) 500-50 MCG/DOSE AEPB Inhale 1 puff INTO THE LUNGS twice a day 07/24/14   Katheren Shams, DO  lisinopril (PRINIVIL,ZESTRIL) 40 MG tablet Take 40 mg by mouth daily.    Historical Provider, MD  montelukast (SINGULAIR) 10 MG tablet Take 1 tablet (  10 mg total) by mouth at bedtime. 07/29/14   Katheren Shams, DO  pantoprazole (PROTONIX) 40 MG tablet Take 1 tablet (40 mg total) by mouth 2 (two) times daily. 10/20/14   Ashly Windell Moulding, DO  sucralfate (CARAFATE) 1 G tablet Take 1 tablet (1 g total) by mouth 4 (four) times daily -  with meals and at bedtime. 10/21/14   Ashly Windell Moulding, DO  traMADol (ULTRAM) 50 MG tablet Take 1 tablet (50 mg total) by mouth every 6 (six) hours as needed. 01/24/15   Milton Ferguson, MD   BP 98/69 mmHg  Pulse 73  Temp(Src) 97.9 F (36.6 C)  Resp 15  Ht 5\' 8"  (1.727 m)  Wt 143 lb 9 oz (65.12 kg)   BMI 21.83 kg/m2  SpO2 99% Physical Exam  Constitutional: He is oriented to person, place, and time. He appears well-developed.  HENT:  Head: Normocephalic.  Eyes: Conjunctivae and EOM are normal. No scleral icterus.  Neck: Neck supple. No thyromegaly present.  Cardiovascular: Normal rate and regular rhythm.  Exam reveals no gallop and no friction rub.   No murmur heard. Pulmonary/Chest: No stridor. He has no wheezes. He has no rales. He exhibits no tenderness.  Abdominal: He exhibits no distension. There is no tenderness. There is no rebound.  Musculoskeletal: Normal range of motion. He exhibits tenderness. He exhibits no edema.  Tenderness across his upper back worse on right side.  Lymphadenopathy:    He has no cervical adenopathy.  Neurological: He is oriented to person, place, and time. He exhibits normal muscle tone. Coordination normal.  Skin: No rash noted. No erythema.  Psychiatric: He has a normal mood and affect. His behavior is normal.    ED Course  Procedures (including critical care time) Labs Review Labs Reviewed  CBC WITH DIFFERENTIAL/PLATELET - Abnormal; Notable for the following:    RBC 3.58 (*)    Hemoglobin 12.1 (*)    HCT 34.9 (*)    Platelets 104 (*)    All other components within normal limits  COMPREHENSIVE METABOLIC PANEL - Abnormal; Notable for the following:    Sodium 134 (*)    CO2 21 (*)    Glucose, Bld 115 (*)    BUN 21 (*)    Creatinine, Ser 1.37 (*)    Albumin 3.3 (*)    ALT 15 (*)    GFR calc non Af Amer 50 (*)    GFR calc Af Amer 58 (*)    All other components within normal limits    Imaging Review Dg Chest 2 View  01/24/2015   CLINICAL DATA:  Shoulder left-sided back pain for 2 weeks.  EXAM: CHEST  2 VIEW  COMPARISON:  10/16/2014  FINDINGS: Posterior right seventh rib fracture with mild displacement, new from June 2016. There is a remote and healed left seventh rib fracture. When accounting for skin folds there is no evidence of  pneumothorax. Linear opacity in the right mid lung consistent with atelectasis.  IMPRESSION: 1. Mildly displaced right seventh rib fracture, recent appearing and new from June 2016. 2. Mild right atelectasis. 3. No visible pneumothorax.   Electronically Signed   By: Monte Fantasia M.D.   On: 01/24/2015 11:23   Ct Angio Abdomen W/cm &/or Wo Contrast  01/24/2015   CLINICAL DATA:  Back pain for 2 weeks. Evaluation for aortic dissection.  EXAM: CT ANGIOGRAPHY CHEST AND ABDOMEN  TECHNIQUE: Multidetector CT imaging of the chest and abdomen was performed using the standard protocol  during bolus administration of intravenous contrast. Multiplanar CT image reconstructions and MIPs were obtained to evaluate the vascular anatomy.  CONTRAST:  171mL OMNIPAQUE IOHEXOL 350 MG/ML SOLN  COMPARISON:  Abdominal MRI 12/10/2014. CT abdomen and pelvis 10/16/2014. Chest radiographs 01/24/2015.  FINDINGS: CTA CHEST FINDINGS  Decreased attenuation of the blood pool is consistent with known anemia. There is no evidence of thoracic aortic intramural hematoma on precontrast images. Incidental note is made of a bovine configuration of the aortic arch. The thoracic aorta is normal in caliber without evidence of dissection. There is mild thoracic aortic atherosclerotic calcification. Heart size is within normal limits.  There is a 1.2 cm precarinal lymph node with a normal fatty hilum. Enlarged right hilar lymph nodes measure up to 1.3 cm in short axis. Enlarged subcarinal and azygoesophageal recess lymph nodes measure up to 2.0 cm in short axis. The configuration of these enlarged lymph nodes are similar to the prior abdominal MRI although better demonstrated on this CT. Subcentimeter left hilar lymph nodes are noted. There is no pleural or pericardial effusion.  Scarring or atelectasis is present posteriorly in the right upper lobe and in both lower lobes. There is thickening of the right minor fissure as seen on prior radiographs. A 3 mm  nodule is present in the right middle lobe just beneath the minor fissure (series 7, image 62). There is a minimally displaced, acute appearing posterior right seventh rib fracture. There are old fractures of the left seventh, ninth, and tenth ribs.  Review of the MIP images confirms the above findings.  CTA ABDOMEN FINDINGS  Abdominal aorta is normal in caliber without evidence of dissection. Mild atherosclerotic calcification is noted. Celiac, superior mesenteric, renal, and inferior mesenteric arteries are patent. Incidental note is made of replaced right hepatic artery arising from the celiac artery.  The liver is again noted to be nodular in contour consistent with cirrhosis. 1.5 cm liver cyst is again seen. Subcentimeter low-density renal lesions likely represent cysts. The spleen, adrenal glands, and pancreas are unremarkable. Gallbladder is grossly unremarkable.  Large volume ascites is similar to the prior MRI and incompletely visualized. 1.6 cm gastrohepatic lymph node is similar to the prior CT, with multiple mildly prominent periportal lymph nodes also again seen. Small caliber omental varices are noted. No dilated loops of bowel are seen in the visualized abdomen. Thoracic spondylosis is noted.  Review of the MIP images confirms the above findings.  IMPRESSION: 1. No evidence of aortic dissection. 2. Cirrhosis and large volume ascites. 3. Acute appearing right seventh rib fracture. 4. 3 mm right middle lobe nodule. If the patient is at high risk for bronchogenic carcinoma, follow-up chest CT at 1 year is recommended. If the patient is at low risk, no follow-up is needed. This recommendation follows the consensus statement: Guidelines for Management of Small Pulmonary Nodules Detected on CT Scans: A Statement from the [employer] as published in Radiology 2005; 237:395-400. 5. Mild right hilar and subcarinal lymphadenopathy, similar to the prior MRI and nonspecific.   Electronically Signed   By:  Logan Bores M.D.   On: 01/24/2015 14:05   Ct Angio Chest Aorta W/cm &/or Wo/cm  01/24/2015   CLINICAL DATA:  Back pain for 2 weeks. Evaluation for aortic dissection.  EXAM: CT ANGIOGRAPHY CHEST AND ABDOMEN  TECHNIQUE: Multidetector CT imaging of the chest and abdomen was performed using the standard protocol during bolus administration of intravenous contrast. Multiplanar CT image reconstructions and MIPs were obtained to evaluate the vascular  anatomy.  CONTRAST:  113mL OMNIPAQUE IOHEXOL 350 MG/ML SOLN  COMPARISON:  Abdominal MRI 12/10/2014. CT abdomen and pelvis 10/16/2014. Chest radiographs 01/24/2015.  FINDINGS: CTA CHEST FINDINGS  Decreased attenuation of the blood pool is consistent with known anemia. There is no evidence of thoracic aortic intramural hematoma on precontrast images. Incidental note is made of a bovine configuration of the aortic arch. The thoracic aorta is normal in caliber without evidence of dissection. There is mild thoracic aortic atherosclerotic calcification. Heart size is within normal limits.  There is a 1.2 cm precarinal lymph node with a normal fatty hilum. Enlarged right hilar lymph nodes measure up to 1.3 cm in short axis. Enlarged subcarinal and azygoesophageal recess lymph nodes measure up to 2.0 cm in short axis. The configuration of these enlarged lymph nodes are similar to the prior abdominal MRI although better demonstrated on this CT. Subcentimeter left hilar lymph nodes are noted. There is no pleural or pericardial effusion.  Scarring or atelectasis is present posteriorly in the right upper lobe and in both lower lobes. There is thickening of the right minor fissure as seen on prior radiographs. A 3 mm nodule is present in the right middle lobe just beneath the minor fissure (series 7, image 62). There is a minimally displaced, acute appearing posterior right seventh rib fracture. There are old fractures of the left seventh, ninth, and tenth ribs.  Review of the MIP  images confirms the above findings.  CTA ABDOMEN FINDINGS  Abdominal aorta is normal in caliber without evidence of dissection. Mild atherosclerotic calcification is noted. Celiac, superior mesenteric, renal, and inferior mesenteric arteries are patent. Incidental note is made of replaced right hepatic artery arising from the celiac artery.  The liver is again noted to be nodular in contour consistent with cirrhosis. 1.5 cm liver cyst is again seen. Subcentimeter low-density renal lesions likely represent cysts. The spleen, adrenal glands, and pancreas are unremarkable. Gallbladder is grossly unremarkable.  Large volume ascites is similar to the prior MRI and incompletely visualized. 1.6 cm gastrohepatic lymph node is similar to the prior CT, with multiple mildly prominent periportal lymph nodes also again seen. Small caliber omental varices are noted. No dilated loops of bowel are seen in the visualized abdomen. Thoracic spondylosis is noted.  Review of the MIP images confirms the above findings.  IMPRESSION: 1. No evidence of aortic dissection. 2. Cirrhosis and large volume ascites. 3. Acute appearing right seventh rib fracture. 4. 3 mm right middle lobe nodule. If the patient is at high risk for bronchogenic carcinoma, follow-up chest CT at 1 year is recommended. If the patient is at low risk, no follow-up is needed. This recommendation follows the consensus statement: Guidelines for Management of Small Pulmonary Nodules Detected on CT Scans: A Statement from the [employer] as published in Radiology 2005; 237:395-400. 5. Mild right hilar and subcarinal lymphadenopathy, similar to the prior MRI and nonspecific.   Electronically Signed   By: Logan Bores M.D.   On: 01/24/2015 14:05   I have personally reviewed and evaluated these images and lab results as part of my medical decision-making.   EKG Interpretation None      MDM   Final diagnoses:  Rib fracture, right, closed, initial encounter     Labs and x-rays unremarkable except CT scan shows seventh rib fracture acute. Also there is a small lung nodule will be followed up by primary care doctor. Patient will be given Ultram for pain.    Milton Ferguson, MD  01/24/15 1548 

## 2015-01-24 NOTE — ED Notes (Signed)
Pt is in stable condition upon d/c and is escorted from ED via wheelchair. 

## 2015-01-24 NOTE — Discharge Instructions (Signed)
Follow up with your family md for recheck.

## 2015-02-07 ENCOUNTER — Other Ambulatory Visit: Payer: Self-pay | Admitting: Obstetrics and Gynecology

## 2015-02-10 DIAGNOSIS — I8500 Esophageal varices without bleeding: Secondary | ICD-10-CM | POA: Diagnosis not present

## 2015-02-10 DIAGNOSIS — K769 Liver disease, unspecified: Secondary | ICD-10-CM | POA: Diagnosis not present

## 2015-02-10 DIAGNOSIS — R188 Other ascites: Secondary | ICD-10-CM | POA: Diagnosis not present

## 2015-02-10 DIAGNOSIS — D649 Anemia, unspecified: Secondary | ICD-10-CM | POA: Diagnosis not present

## 2015-02-10 DIAGNOSIS — K7460 Unspecified cirrhosis of liver: Secondary | ICD-10-CM | POA: Diagnosis not present

## 2015-03-02 ENCOUNTER — Other Ambulatory Visit: Payer: Self-pay | Admitting: Family Medicine

## 2015-03-03 NOTE — Telephone Encounter (Signed)
Patient has an appt on 03/04/2015. Jazmin Hartsell,CMA

## 2015-03-04 ENCOUNTER — Encounter: Payer: Self-pay | Admitting: Obstetrics and Gynecology

## 2015-03-04 ENCOUNTER — Ambulatory Visit (INDEPENDENT_AMBULATORY_CARE_PROVIDER_SITE_OTHER): Payer: Medicare Other | Admitting: Obstetrics and Gynecology

## 2015-03-04 VITALS — BP 120/69 | HR 69 | Temp 98.5°F | Ht 69.0 in | Wt 143.0 lb

## 2015-03-04 DIAGNOSIS — I10 Essential (primary) hypertension: Secondary | ICD-10-CM | POA: Diagnosis not present

## 2015-03-04 DIAGNOSIS — K4090 Unilateral inguinal hernia, without obstruction or gangrene, not specified as recurrent: Secondary | ICD-10-CM

## 2015-03-04 DIAGNOSIS — Z23 Encounter for immunization: Secondary | ICD-10-CM | POA: Diagnosis not present

## 2015-03-04 DIAGNOSIS — Z87898 Personal history of other specified conditions: Secondary | ICD-10-CM

## 2015-03-04 DIAGNOSIS — Z8719 Personal history of other diseases of the digestive system: Secondary | ICD-10-CM | POA: Insufficient documentation

## 2015-03-04 DIAGNOSIS — M8580 Other specified disorders of bone density and structure, unspecified site: Secondary | ICD-10-CM

## 2015-03-04 DIAGNOSIS — R634 Abnormal weight loss: Secondary | ICD-10-CM | POA: Diagnosis not present

## 2015-03-04 DIAGNOSIS — Z9189 Other specified personal risk factors, not elsewhere classified: Secondary | ICD-10-CM | POA: Insufficient documentation

## 2015-03-04 LAB — BASIC METABOLIC PANEL
BUN: 23 mg/dL (ref 7–25)
CALCIUM: 9.2 mg/dL (ref 8.6–10.3)
CO2: 24 mmol/L (ref 20–31)
CREATININE: 1.32 mg/dL — AB (ref 0.70–1.18)
Chloride: 104 mmol/L (ref 98–110)
Glucose, Bld: 108 mg/dL — ABNORMAL HIGH (ref 65–99)
Potassium: 3.6 mmol/L (ref 3.5–5.3)
Sodium: 138 mmol/L (ref 135–146)

## 2015-03-04 LAB — CBC WITH DIFFERENTIAL/PLATELET
Basophils Absolute: 0.1 10*3/uL (ref 0.0–0.1)
Basophils Relative: 1 % (ref 0–1)
EOS ABS: 0.2 10*3/uL (ref 0.0–0.7)
Eosinophils Relative: 3 % (ref 0–5)
HCT: 33.4 % — ABNORMAL LOW (ref 39.0–52.0)
Hemoglobin: 11.3 g/dL — ABNORMAL LOW (ref 13.0–17.0)
LYMPHS ABS: 2.2 10*3/uL (ref 0.7–4.0)
Lymphocytes Relative: 37 % (ref 12–46)
MCH: 33.5 pg (ref 26.0–34.0)
MCHC: 33.8 g/dL (ref 30.0–36.0)
MCV: 99.1 fL (ref 78.0–100.0)
MONO ABS: 0.5 10*3/uL (ref 0.1–1.0)
MONOS PCT: 8 % (ref 3–12)
MPV: 10.8 fL (ref 8.6–12.4)
NEUTROS PCT: 51 % (ref 43–77)
Neutro Abs: 3.1 10*3/uL (ref 1.7–7.7)
Platelets: 115 10*3/uL — ABNORMAL LOW (ref 150–400)
RBC: 3.37 MIL/uL — ABNORMAL LOW (ref 4.22–5.81)
RDW: 15.5 % (ref 11.5–15.5)
WBC: 6 10*3/uL (ref 4.0–10.5)

## 2015-03-04 MED ORDER — CERAVE EX LOTN: TOPICAL_LOTION | CUTANEOUS | Status: DC

## 2015-03-04 MED ORDER — ENSURE COMPLETE SHAKE PO LIQD: 1.0000 | Freq: Every day | ORAL | Status: DC

## 2015-03-04 NOTE — Progress Notes (Signed)
    Subjective: Chief Complaint  Patient presents with  . Hypertension  . Hernia    HPI: Donald Moreno is a 74 y.o. presenting to clinic today to discuss the following:  #Hypertension: Blood pressure at home: Does not monitor Blood pressure today: within normal limits Taking Meds: Yes; on two diuretics(placed on by GI doctor for ascites) and lisinopril Side effects: None ROS: Denies headache, dizziness, visual changes, nausea, vomiting, chest pain, abdominal pain or shortness of breath.  #Hernia: Here for routine follow-up of hernias Has inguinal and umbilical hernia Groin hernia has increased in size and is harder to reduce Causes patient discomfort intermittently  #Weight loss: States he has lost about 30lbs in the last couple of months States he has never had a good appetite but is eating like normal Unsure what could be causing weight loss Denies any more GI bleeding Denies any other constitutional symptoms such as fever, night sweats, chills History of recurrent bone fractures No h/o cancer - lung nodule found on CT chest a couple months ago (recommended 1 yr repeat CT scan)  Non-smoker but does dip daily Last alcoholic drink was 6 months ago  Health Maintenance: Up to date just needs flu vaccine   ROS in HPI.  Past Medical, Surgical, Social, and Family History Reviewed & Updated per EMR.   Objective: BP 120/69 mmHg  Pulse 69  Temp(Src) 98.5 F (36.9 C) (Oral)  Ht 5\' 9"  (1.753 m)  Wt 143 lb (64.864 kg)  BMI 21.11 kg/m2  Physical Exam  Constitutional: He is well-developed, well-nourished, and in no distress.  Thin appearing  HENT:  Head: Normocephalic and atraumatic.  Eyes: EOM are normal.  Abdominal: Soft. Normal appearance and bowel sounds are normal. He exhibits ascites. There is hepatomegaly. There is no tenderness. A hernia is present. Hernia confirmed positive in the umbilical area and confirmed positive in the right inguinal area.  Umbilical  hernia reducible. Inguinal hernia about 10cm in size. Non-reducible. Not painful. Does not appear incarcerated. No abnormal coloration.     Assessment/Plan: Please see problem based Assessment and Plan   Orders Placed This Encounter  Procedures  . DG Bone Density    Standing Status: Future     Number of Occurrences:      Standing Expiration Date: 05/03/2016    Order Specific Question:  Reason for Exam (SYMPTOM  OR DIAGNOSIS REQUIRED)    Answer:  Screening bone density    Order Specific Question:  Preferred imaging location?    Answer:  [facility]  . Flu Vaccine QUAD 36+ mos IM  . Basic Metabolic Panel  . CBC with Differential/Platelet  . Ambulatory referral to General Surgery    Referral Priority:  Routine    Referral Type:  Surgical    Referral Reason:  Specialty Services Required    Requested Specialty:  General Surgery    Number of Visits Requested:  1    Meds ordered this encounter  Medications  . Nutritional Supplements (ENSURE COMPLETE SHAKE) LIQD    Sig: Take 1 Container by mouth daily.    Dispense:  237 mL    Refill:  11  . Emollient (CERAVE) LOTN    Sig: Apply to dry skin daily.    Dispense:  1 Bottle    Refill:  [place], DO 03/04/2015, 5:24 PM PGY-2, [place]

## 2015-03-04 NOTE — Patient Instructions (Signed)
Here are some of the things we discussed today: -I sent in a prescription for you to start Ensure supplementation for your weight -Referral back to surgeon for hernia -I will contact you about your blood work -Start taking the calcium and vitamin d supplements; I qam going to check your bone density to screen for osteoporosis.  -Lotion called in for dry skin as well  Please schedule a follow-up appointment for 6 weeks so I can monitor your weight.    Thanks for allowing me to be a part of your care! Dr. Gerarda Fraction

## 2015-03-05 ENCOUNTER — Telehealth: Payer: Self-pay | Admitting: Obstetrics and Gynecology

## 2015-03-05 MED ORDER — VITAMIN D3 25 MCG (1000 UT) PO CAPS: 1.0000 | ORAL_CAPSULE | Freq: Every day | ORAL | Status: DC

## 2015-03-05 NOTE — Assessment & Plan Note (Addendum)
Patient with 20lb weight loss in the last 2 months. Patient denies any dietary changes. States he has always had poor appetite. Patient with history of ascites and on diuretics but weight loss from diuretics would not be this extreme. Will work up other etiology of weight loss. Basic blood work ordered. Patient up to date on colonoscopy. No h/o smoking but does dip. CT chest this year showed 74mm RML ung nodule that requires yearly follow-up. Patient given Rx for ensure to take at least daily as a meal supplement. Discussed eating a more balanced diet. Will follow-up in 4 weeks. Will place referral to nutrientist Dr. Jenne Campus.

## 2015-03-05 NOTE — Telephone Encounter (Signed)
Patient was referred for a bone density test and [facility] told him that the way the order was put in that the insurance would not pay.  Please let him know when this has been corrected.

## 2015-03-05 NOTE — Telephone Encounter (Signed)
Will forward to MD to change diagnosis for this order.  Donald Moreno,CMA

## 2015-03-05 NOTE — Assessment & Plan Note (Addendum)
Patient with history of multiple fractures some without known trauma. Last seen in ED in Sept 2016 for rib fracture. Will place order for bone density scan. Patient to start Vit-D supplement.

## 2015-03-05 NOTE — Assessment & Plan Note (Signed)
Inguinal hernia per patient is larger in size and causing more discomfort. On physical exam hard to reduce and is moderately firm. Does not appear incarcerated at this time and no color changes. No tenderness with palpation. Patient previously seen by general surgery who said monitor it. Patient was not a candidate at that time for procedures due to health. Will place another referral as I believe patient needs to be reevaluated.

## 2015-03-05 NOTE — Assessment & Plan Note (Signed)
BP stable today and wnl. Patient to continue taking his lisinopril and diuretic medications. Side effects and signs of hypotension discussed.

## 2015-03-06 NOTE — Addendum Note (Signed)
Addended by: Katheren Shams on: 03/06/2015 08:51 AM   Modules accepted: Miquel Dunn

## 2015-03-06 NOTE — Telephone Encounter (Signed)
Placed another associated diagnosis to bone density scan. [facility] and [facility] gave me qualifying diagnosis so this should work. Please let patient and imaging center know so they can schedule imaging. Thank you.

## 2015-03-06 NOTE — Telephone Encounter (Signed)
Lm for patient to call back.  Please inform him that the proper changes have been made and he can now call to schedule his bone density scan.  Thanks [employer]

## 2015-03-27 DIAGNOSIS — K4090 Unilateral inguinal hernia, without obstruction or gangrene, not specified as recurrent: Secondary | ICD-10-CM | POA: Diagnosis not present

## 2015-03-30 ENCOUNTER — Telehealth: Payer: Self-pay | Admitting: Obstetrics and Gynecology

## 2015-03-30 NOTE — Telephone Encounter (Signed)
Called patient due to general surgery concern that his ascites has worsened. I would like patient to be seen either by a SDA or myself if possible in order to reassess his ascites. He may need to have another paracentesis vs oral diuretic. Patient is going to call to make appointment. Thanks

## 2015-03-31 ENCOUNTER — Ambulatory Visit (INDEPENDENT_AMBULATORY_CARE_PROVIDER_SITE_OTHER): Payer: Medicare Other | Admitting: Family Medicine

## 2015-03-31 ENCOUNTER — Ambulatory Visit (HOSPITAL_COMMUNITY)
Admission: RE | Admit: 2015-03-31 | Discharge: 2015-03-31 | Disposition: A | Discharge status: Home / Self Care | Payer: Medicare Other | Source: Ambulatory Visit | Attending: Family Medicine | Admitting: Family Medicine

## 2015-03-31 ENCOUNTER — Other Ambulatory Visit: Payer: Self-pay | Admitting: Family Medicine

## 2015-03-31 ENCOUNTER — Encounter: Payer: Self-pay | Admitting: Family Medicine

## 2015-03-31 VITALS — BP 126/72 | HR 69 | Temp 98.6°F | Wt 152.0 lb

## 2015-03-31 DIAGNOSIS — I493 Ventricular premature depolarization: Secondary | ICD-10-CM | POA: Insufficient documentation

## 2015-03-31 DIAGNOSIS — R188 Other ascites: Principal | ICD-10-CM

## 2015-03-31 DIAGNOSIS — K7460 Unspecified cirrhosis of liver: Secondary | ICD-10-CM

## 2015-03-31 DIAGNOSIS — R008 Other abnormalities of heart beat: Secondary | ICD-10-CM | POA: Diagnosis not present

## 2015-03-31 DIAGNOSIS — I499 Cardiac arrhythmia, unspecified: Secondary | ICD-10-CM

## 2015-03-31 LAB — BASIC METABOLIC PANEL WITH GFR
BUN: 22 mg/dL (ref 7–25)
CALCIUM: 9.6 mg/dL (ref 8.6–10.3)
CO2: 25 mmol/L (ref 20–31)
CREATININE: 1.2 mg/dL — AB (ref 0.70–1.18)
Chloride: 104 mmol/L (ref 98–110)
GFR, Est African American: 69 mL/min (ref >=60)
GFR, Est Non African American: 60 mL/min (ref >=60)
Glucose, Bld: 103 mg/dL — ABNORMAL HIGH (ref 65–99)
Potassium: 4.4 mmol/L (ref 3.5–5.3)
SODIUM: 138 mmol/L (ref 135–146)

## 2015-03-31 MED ORDER — SPIRONOLACTONE 100 MG PO TABS: 100.0000 mg | ORAL_TABLET | Freq: Every day | ORAL | Status: DC

## 2015-03-31 MED ORDER — FUROSEMIDE 40 MG PO TABS: 40.0000 mg | ORAL_TABLET | Freq: Every day | ORAL | Status: DC

## 2015-03-31 NOTE — Progress Notes (Signed)
    Subjective:  Donald Moreno is a 74 y.o. male who presents to the [facility] today for same day appointment with a chief complaint of worsening ascites.    HPI:  Ascites Patient presents with worsening ascites for the past month. Patient saw his surgeon last week to discuss hernia repair, however his surgeon told him that he would be unable to perform the surgery due to his ascites. Patient feels like his abdomen is increasing in size. He has gained 9-10 pounds in the past month. Patient is unsure what cased his ascites, but is currently being followed by Dr Paulita Fujita. His is currently taking spironolactone 50mg  daily and lasix 40mg  daily. Patient is not aware of anything that makes the ascites better or worse. Patient denies any abdominal pain. No melena or hematochezia. No hematemesis. No fevers or chills.   ROS: Per HPI  PMH:  The following were reviewed and entered/updated in epic: Past Medical History  Diagnosis Date  . Arthritis   . Gout   . Asthma   . Hypertension   . ETOH abuse    Patient Active Problem List   Diagnosis Date Noted  . Hepatic cirrhosis ([place]) 03/31/2015  . Irregular heartbeat 03/31/2015  . History of GI bleed 03/04/2015  . At risk for decreased bone density 03/04/2015  . Weight loss, unintentional 03/04/2015  . Abdominal distention 11/19/2014  . Inguinal hernia 10/28/2014  . ASTHMA, PERSISTENT 01/29/2010  . NICOTINE ADDICTION 04/30/2007  . Gout 07/13/2006  . HYPERTENSION, BENIGN SYSTEMIC 07/13/2006  . Arthropathy 07/13/2006   No past surgical history on file.   Objective:  Physical Exam: BP 126/72 mmHg  Pulse 69  Temp(Src) 98.6 F (37 C) (Oral)  Wt 152 lb (68.947 kg)  Gen: NAD, resting comfortably CV: RRR with occasional ectopic beat. No murmurs appreciated Lungs: NWOB, CTAB with no crackles, wheezes, or rhonchi GI: Very distended abdomen. Nontender. 2cm umbilical hernia noted, easily reducible MSK: no cyanosis or clubbing noted Skin: warm,  dry Neuro: grossly normal, moves all extremities Psych: Normal affect and thought content  EKG: NSR with occasional PVCs  Assessment/Plan:  Hepatic cirrhosis ([place]) Patient with worsening ascites due to cirrhosis. No clear etiology for patient's cirrhosis on chart review, and patient is not sure of the cause. Patient is currently being followed by Dr Paulita Fujita. Requested records to be sent to this office.  Will order a therapeutic paracentesis. Will also increase patient's diuretic regimen to spironolactone 100mg  daily and lasix 40mg  daily. Will check BMP today. Instructed patient to follow up in 1 week for recheck on ascites and repeat labs. May need increase in diuretic therapy if fluid rapidly re-accumulates.   No hepatitis labs available in our EMR. Hopefully, patient's GI physician has already investigated this. Will wait for further records before obtain hepatitis panel. Additionally, patient may benefit from prophylactic propranolol given his history of upper GI bleeds, though will wait until obtaining records from GI.   Irregular heartbeat Noted on physical exam today. EKG with PVCs. No afib.     Algis Greenhouse. Jerline Pain, [place] Resident PGY-2 03/31/2015 11:08 AM

## 2015-03-31 NOTE — Patient Instructions (Addendum)
We scheduled you to have fluid taken off your belly. Thursday 8:45am  We will increase your fluid pills. We sent in a new prescription. Please take 100mg  of spironolactone daily and 40mg  of lasix daily. Please come back in 1 week for a recheck on your ascites and blood work.  We would like records from your stomach doctor. Please send those here as soon as possible.  See you again in 1 week.  Take care,  Dr Jerline Pain

## 2015-03-31 NOTE — Assessment & Plan Note (Signed)
Noted on physical exam today. EKG with PVCs. No afib.

## 2015-03-31 NOTE — Assessment & Plan Note (Signed)
Patient with worsening ascites due to cirrhosis. No clear etiology for patient's cirrhosis on chart review, and patient is not sure of the cause. Patient is currently being followed by Dr Paulita Fujita. Requested records to be sent to this office.  Will order a therapeutic paracentesis. Will also increase patient's diuretic regimen to spironolactone 100mg  daily and lasix 40mg  daily. Will check BMP today. Instructed patient to follow up in 1 week for recheck on ascites and repeat labs. May need increase in diuretic therapy if fluid rapidly re-accumulates.   No hepatitis labs available in our EMR. Hopefully, patient's GI physician has already investigated this. Will wait for further records before obtain hepatitis panel. Additionally, patient may benefit from prophylactic propranolol given his history of upper GI bleeds, though will wait until obtaining records from GI.

## 2015-04-01 ENCOUNTER — Encounter: Payer: Self-pay | Admitting: Family Medicine

## 2015-04-02 ENCOUNTER — Ambulatory Visit (HOSPITAL_COMMUNITY): Payer: Medicare Other

## 2015-04-02 ENCOUNTER — Ambulatory Visit (HOSPITAL_COMMUNITY)
Admission: RE | Admit: 2015-04-02 | Discharge: 2015-04-02 | Disposition: A | Discharge status: Home / Self Care | Payer: Medicare Other | Source: Ambulatory Visit | Attending: Family Medicine | Admitting: Family Medicine

## 2015-04-02 DIAGNOSIS — K7460 Unspecified cirrhosis of liver: Secondary | ICD-10-CM | POA: Diagnosis not present

## 2015-04-02 DIAGNOSIS — R140 Abdominal distension (gaseous): Secondary | ICD-10-CM | POA: Insufficient documentation

## 2015-04-02 DIAGNOSIS — R188 Other ascites: Secondary | ICD-10-CM | POA: Insufficient documentation

## 2015-04-02 DIAGNOSIS — I499 Cardiac arrhythmia, unspecified: Secondary | ICD-10-CM

## 2015-04-02 MED ORDER — LIDOCAINE HCL (PF) 1 % IJ SOLN
INTRAMUSCULAR | Status: AC
  Filled 2015-04-02: qty 10

## 2015-04-02 MED ORDER — ALBUMIN HUMAN 25 % IV SOLN
50.0000 g | Freq: Once | INTRAVENOUS | Status: AC
  Administered 2015-04-02: 50 g via INTRAVENOUS
  Filled 2015-04-02: qty 200

## 2015-04-02 NOTE — Procedures (Signed)
Successful US paracentesis No comp Stable Full report in PACS

## 2015-04-06 ENCOUNTER — Encounter: Payer: Self-pay | Admitting: Student

## 2015-04-06 ENCOUNTER — Ambulatory Visit (INDEPENDENT_AMBULATORY_CARE_PROVIDER_SITE_OTHER): Payer: Medicare Other | Admitting: Student

## 2015-04-06 VITALS — BP 134/78 | HR 71 | Temp 98.4°F | Ht 69.0 in | Wt 144.2 lb

## 2015-04-06 DIAGNOSIS — R188 Other ascites: Secondary | ICD-10-CM | POA: Diagnosis not present

## 2015-04-06 DIAGNOSIS — K7031 Alcoholic cirrhosis of liver with ascites: Secondary | ICD-10-CM | POA: Insufficient documentation

## 2015-04-06 NOTE — Assessment & Plan Note (Signed)
Status post therapeutic tap on 11/17 of 7.2L, increase in spironolactone to 100 mg from 50 mg. Still unclear etiology of ascites but has again asked the patient to bring record from his GI office - repeat BMP today and adjust spironolacone as needed - will delay further work up until records received for fear of repeating what has already been performed

## 2015-04-06 NOTE — Progress Notes (Signed)
   Subjective:    Patient ID: Donald Moreno, male    DOB: 05/23/1940, 74 y.o.   MRN: [id number]   CC: Follow up labs  HPI 74 y/o M with ascites with recent paracentesis of 7.2 L on 11/17, and increase in home spironolactone from 50 mg to 100 mg and was continued on home lasix 40 mg  Ascites - Today since is therapeutic paracentesis, states his abdomen has felt much better - "it has gone down a lot" after the tap - he has had ascites like this in past that has been tapped twice prior to this most recent episode - last therapeutic tap was 3 months ago per patient - he is manages by GI, Dr Paulita Fujita - denies abdominal pain, fevers/chills, chest pain, SOB  Review of Systems   See HPI for ROS.   Past Medical History  Diagnosis Date  . Arthritis   . Gout   . Asthma   . Hypertension   . ETOH abuse    No past surgical history on file.  Social History   Social History  . Marital Status: Divorced    Spouse Name: N/A  . Number of Children: 5  . Years of Education: 10   Occupational History  . Retired- Administrator    Social History Main Topics  . Smoking status: Never Smoker   . Smokeless tobacco: Current User  . Alcohol Use: 1.2 oz/week    2 Cans of beer per week  . Drug Use: No  . Sexual Activity: Not Currently   Other Topics Concern  . Not on file   Social History Narrative   Lives with his sister Stanton Kidney and nephew in [place] home.     Alcohol: 2 cans of beer daily    Tobacco: snuff all day (now 1/3 can per day). Never smoked.    Exercise: walks a to store a few times a week.       Other: dentures, walker (occasionally)      Will be visiting his son in [place] for his graduation in July 2012.          Hobbies:  TV, drink beer, visit with friends    Pets:  Dog, Bean    Objective:  BP 134/78 mmHg  Pulse 71  Temp(Src) 98.4 F (36.9 C) (Oral)  Ht 5\' 9"  (1.753 m)  Wt 144 lb 3.2 oz (65.409 kg)  BMI 21.29 kg/m2 Vitals and nursing note reviewed  General:  NAD Cardiac: RRR, normal heart sounds, Respiratory: CTAB, normal effort Abdomen: mildly distended with no discernable fluid wave, non tender, reducible approximately 2 cm umbilical hernia Extremities: no edema or cyanosis. WWP. Skin: warm and dry, no rashes noted Neuro: alert and oriented, no focal deficits   Assessment & Plan:    Ascites Status post therapeutic tap on 11/17 of 7.2L, increase in spironolactone to 100 mg from 50 mg. Still unclear etiology of ascites but has again asked the patient to bring record from his GI office - repeat BMP today and adjust spironolacone as needed - will delay further work up until records received for fear of repeating what has already been performed     Bleu Moisan A. Lincoln Brigham MD, [facility] Resident PGY-1 Pager [phone]

## 2015-04-06 NOTE — Patient Instructions (Addendum)
Follow up in 1 month If you have questions or concerns, please call the office Please have your GI specialist, Dr Paulita Fujita, send you records to out office If you have questions or concerns, please call the office at [phone]

## 2015-04-07 LAB — BASIC METABOLIC PANEL
BUN: 25 mg/dL (ref 7–25)
CALCIUM: 9.4 mg/dL (ref 8.6–10.3)
CO2: 23 mmol/L (ref 20–31)
Chloride: 103 mmol/L (ref 98–110)
Creat: 1.18 mg/dL (ref 0.70–1.18)
GLUCOSE: 86 mg/dL (ref 65–99)
POTASSIUM: 4.5 mmol/L (ref 3.5–5.3)
SODIUM: 136 mmol/L (ref 135–146)

## 2015-04-13 ENCOUNTER — Other Ambulatory Visit: Payer: Medicare Other

## 2015-04-20 ENCOUNTER — Other Ambulatory Visit: Payer: Self-pay | Admitting: Obstetrics and Gynecology

## 2015-04-28 ENCOUNTER — Other Ambulatory Visit (HOSPITAL_COMMUNITY): Payer: Self-pay | Admitting: Gastroenterology

## 2015-04-28 DIAGNOSIS — K7460 Unspecified cirrhosis of liver: Secondary | ICD-10-CM | POA: Diagnosis not present

## 2015-04-28 DIAGNOSIS — I8500 Esophageal varices without bleeding: Secondary | ICD-10-CM | POA: Diagnosis not present

## 2015-04-28 DIAGNOSIS — K769 Liver disease, unspecified: Secondary | ICD-10-CM | POA: Diagnosis not present

## 2015-04-28 DIAGNOSIS — R188 Other ascites: Secondary | ICD-10-CM

## 2015-04-28 DIAGNOSIS — D649 Anemia, unspecified: Secondary | ICD-10-CM | POA: Diagnosis not present

## 2015-05-04 ENCOUNTER — Ambulatory Visit (HOSPITAL_COMMUNITY)
Admission: RE | Admit: 2015-05-04 | Discharge: 2015-05-04 | Disposition: A | Discharge status: Home / Self Care | Payer: Medicare Other | Source: Ambulatory Visit | Attending: Gastroenterology | Admitting: Gastroenterology

## 2015-05-04 DIAGNOSIS — K7460 Unspecified cirrhosis of liver: Secondary | ICD-10-CM | POA: Diagnosis not present

## 2015-05-04 DIAGNOSIS — R188 Other ascites: Secondary | ICD-10-CM | POA: Diagnosis not present

## 2015-05-04 MED ORDER — ALBUMIN HUMAN 25 % IV SOLN
50.0000 g | Freq: Once | INTRAVENOUS | Status: AC
  Administered 2015-05-04: 50 g via INTRAVENOUS
  Filled 2015-05-04: qty 200

## 2015-05-04 MED ORDER — LIDOCAINE HCL (PF) 1 % IJ SOLN
INTRAMUSCULAR | Status: AC
  Filled 2015-05-04: qty 10

## 2015-05-04 NOTE — Procedures (Signed)
US guided therapeutic paracentesis performed yielding 7 liters yellow fluid (maximum ordered). No immediate complications. The pt will receive IV albumin postprocedure.

## 2015-05-06 ENCOUNTER — Ambulatory Visit (INDEPENDENT_AMBULATORY_CARE_PROVIDER_SITE_OTHER): Payer: Medicare Other | Admitting: Family Medicine

## 2015-05-06 ENCOUNTER — Encounter: Payer: Self-pay | Admitting: Family Medicine

## 2015-05-06 VITALS — BP 102/62 | HR 82 | Temp 98.2°F | Ht 69.0 in | Wt 141.0 lb

## 2015-05-06 DIAGNOSIS — R188 Other ascites: Principal | ICD-10-CM

## 2015-05-06 DIAGNOSIS — K7460 Unspecified cirrhosis of liver: Secondary | ICD-10-CM | POA: Diagnosis not present

## 2015-05-06 DIAGNOSIS — I10 Essential (primary) hypertension: Secondary | ICD-10-CM

## 2015-05-06 LAB — COMPLETE METABOLIC PANEL WITH GFR
ALT: 12 U/L (ref 9–46)
AST: 19 U/L (ref 10–35)
Albumin: 3.4 g/dL — ABNORMAL LOW (ref 3.6–5.1)
Alkaline Phosphatase: 93 U/L (ref 40–115)
BUN: 55 mg/dL — ABNORMAL HIGH (ref 7–25)
CHLORIDE: 106 mmol/L (ref 98–110)
CO2: 19 mmol/L — AB (ref 20–31)
CREATININE: 2.89 mg/dL — AB (ref 0.70–1.18)
Calcium: 8.9 mg/dL (ref 8.6–10.3)
GFR, EST AFRICAN AMERICAN: 24 mL/min — AB (ref >=60)
GFR, Est Non African American: 21 mL/min — ABNORMAL LOW (ref >=60)
Glucose, Bld: 115 mg/dL — ABNORMAL HIGH (ref 65–99)
POTASSIUM: 5 mmol/L (ref 3.5–5.3)
Sodium: 133 mmol/L — ABNORMAL LOW (ref 135–146)
Total Bilirubin: 0.5 mg/dL (ref 0.2–1.2)
Total Protein: 6.7 g/dL (ref 6.1–8.1)

## 2015-05-06 LAB — CBC WITH DIFFERENTIAL/PLATELET
BASOS ABS: 0.1 10*3/uL (ref 0.0–0.1)
Basophils Relative: 1 % (ref 0–1)
EOS PCT: 11 % — AB (ref 0–5)
Eosinophils Absolute: 0.7 10*3/uL (ref 0.0–0.7)
HEMATOCRIT: 33.2 % — AB (ref 39.0–52.0)
HEMOGLOBIN: 10.9 g/dL — AB (ref 13.0–17.0)
LYMPHS ABS: 2.1 10*3/uL (ref 0.7–4.0)
LYMPHS PCT: 32 % (ref 12–46)
MCH: 33.5 pg (ref 26.0–34.0)
MCHC: 32.8 g/dL (ref 30.0–36.0)
MCV: 102.2 fL — AB (ref 78.0–100.0)
MPV: 12.1 fL (ref 8.6–12.4)
Monocytes Absolute: 0.3 10*3/uL (ref 0.1–1.0)
Monocytes Relative: 5 % (ref 3–12)
NEUTROS ABS: 3.3 10*3/uL (ref 1.7–7.7)
Neutrophils Relative %: 51 % (ref 43–77)
Platelets: 122 10*3/uL — ABNORMAL LOW (ref 150–400)
RBC: 3.25 MIL/uL — AB (ref 4.22–5.81)
RDW: 14.4 % (ref 11.5–15.5)
WBC: 6.5 10*3/uL (ref 4.0–10.5)

## 2015-05-06 LAB — PROTIME-INR
INR: 1.34 (ref <1.50)
PROTHROMBIN TIME: 16.7 s — AB (ref 11.6–15.2)

## 2015-05-06 NOTE — Assessment & Plan Note (Signed)
Pt. With low BP today 102/63, especially given Cirrhosis. He was also recently taken off of his Aldactone and lasix and continues with low blood pressure. He has not had any hypotensive episodes. Cr had been elevated previously.  - Check kidney function today.  - Will discontinue Lisinopril today given low BP's in the setting of Cirrhosis and concern for kidney function.  - Follow up for BP check in 3 weeks.  - Would avoid ACE if possible if he needs to restart BP medication.

## 2015-05-06 NOTE — Progress Notes (Signed)
Patient ID: Donald Moreno, male   DOB: [birth year], 74 y.o.   MRN: [id number]   [facility] Aquilla Hacker, MD Phone: [phone]  Subjective:   # Cirrhosis Follow Up - pt. Received therapeutic paracentesis on 12/19.  - He says that he is feeling much better after his tap.  - No fever, no chills, no abdominal pain.  - He has been following with GI Dr. Paulita Fujita for his symptoms.  - He says he was referred to Nephrology.  - Now off of his lasix / aldactone at the direction of Dr. Paulita Fujita until he sees nephrology. He has not been on any propranolol for variceal ppx.  - He is not drinking any more. Has not had a drinkin 7 months per pt.  - He does not know if he has ever been tested for Hepatitis.  - He has not had any episodes of dizziness or lightheadedness.  - No confusion, no nausea, or vomiting.  - He remains in good spirits.  - Pt. Is up to date on immunizations / health maintenance.   # HTN  - pt. Is on lisinopril for BP medication 40 mg daily.  - He was on lasix and aldactone but is off of these now.  - No hypotensive episodes.  - He has had rising creatinine in the past, but most recently here has been relatively normal.  - No chest pain , SOB, or LE edema.   All relevant systems were reviewed and were negative unless otherwise noted in the HPI  Past Medical History Reviewed problem list.  Medications- reviewed and updated Current Outpatient Prescriptions  Medication Sig Dispense Refill  . ADVAIR DISKUS 500-50 MCG/DOSE AEPB Inhale 1 puff two times  daily 120 each 3  . albuterol (PROAIR HFA) 108 (90 BASE) MCG/ACT inhaler take 2 puffs every 6 hours if needed for wheezing 8.5 g 5  . allopurinol (ZYLOPRIM) 300 MG tablet Take 1 tablet by mouth  daily 60 tablet 1  . aspirin 81 MG tablet Take 81 mg by mouth daily.    . Cholecalciferol (VITAMIN D3) 1000 UNITS CAPS Take 1 capsule (1,000 Units total) by mouth daily. 60 capsule 1  . colchicine (COLCRYS) 0.6 MG  tablet Take 2 tablets(1.2mg  total) at first sign of flare. One hour later take 1 tablet (0.6mg ). (Patient taking differently: Take 0.6-1.2 mg by mouth as needed (gout flare). Take 2 tablets(1.2mg  total) at first sign of flare. One hour later take 1 tablet (0.6mg ).) 60 tablet 3  . Emollient (CERAVE) LOTN Apply to dry skin daily. 1 Bottle 2  . ferrous sulfate 325 (65 FE) MG tablet Take 1 tablet (325 mg total) by mouth 2 (two) times daily with a meal. 60 tablet 3  . furosemide (LASIX) 40 MG tablet Take 1 tablet (40 mg total) by mouth daily. 30 tablet 0  . montelukast (SINGULAIR) 10 MG tablet Take 1 tablet (10 mg total) by mouth at bedtime. 90 tablet 6  . Nutritional Supplements (ENSURE COMPLETE SHAKE) LIQD Take 1 Container by mouth daily. 237 mL 11  . pantoprazole (PROTONIX) 40 MG tablet Take 1 tablet (40 mg total) by mouth 2 (two) times daily. 60 tablet 0  . spironolactone (ALDACTONE) 100 MG tablet Take 1 tablet (100 mg total) by mouth daily. 30 tablet 0  . sucralfate (CARAFATE) 1 G tablet Take 1 tablet (1 g total) by mouth 4 (four) times daily -  with meals and at bedtime. (Patient not taking: Reported on 03/04/2015)  28 tablet 0  . traMADol (ULTRAM) 50 MG tablet Take 1 tablet (50 mg total) by mouth every 6 (six) hours as needed. (Patient not taking: Reported on 03/04/2015) 30 tablet 0   No current facility-administered medications for this visit.   Chief complaint-noted No additions to family history Social history- patient uses chewable tobacco products.   Objective: BP 102/62 mmHg  Pulse 82  Temp(Src) 98.2 F (36.8 C) (Oral)  Ht 5\' 9"  (1.753 m)  Wt 141 lb (63.957 kg)  BMI 20.81 kg/m2 Gen: NAD, alert, cooperative with exam, Cachectic.  HEENT: NCAT, EOMI, PERRL, TMs nml, O/P clear. Sclera clear. No icterus.  Neck: FROM, supple, no LAD.  CV: RRR, good S1/S2, no murmur Resp: CTABL, no wheezes, non-labored Abd: S,NT, no peritoneal signs, Bandaid in place over paracentesis site, Distended  abdomen with obvious fluid wave, venous congestion over abdominal wall noted , BS present, no guarding or organomegaly.  Ext: No edema, warm, normal tone, moves UE/LE spontaneously, Cachectic extremities.  Neuro: Alert and oriented, No gross deficits Skin: no rashes no lesions.   Assessment/Plan:   HYPERTENSION, BENIGN SYSTEMIC Pt. With low BP today 102/63, especially given Cirrhosis. He was also recently taken off of his Aldactone and lasix and continues with low blood pressure. He has not had any hypotensive episodes. Cr had been elevated previously.  - Check kidney function today.  - Will discontinue Lisinopril today given low BP's in the setting of Cirrhosis and concern for kidney function.  - Follow up for BP check in 3 weeks.  - Would avoid ACE if possible if he needs to restart BP medication.   Hepatic cirrhosis ([place]) Ongoing follow up for Hepatic Cirrhosis. Presumptively ETOH cirrhosis Child's B at this point. Have not received records from GI yet. Had a second therapeutic paracentesis two days ago without complications. No signs of BP. He has ongoing follow up with Dr. Paulita Fujita of GI. Recently discontinued Lasix and Aldactone given ? Kidney function.  - will check laboratory work today given that we still do not have records from GI. - CMET, CBC, Hepatitis, PT/ INR.  - Discontinuing Lisinopril today given low BP's in cirrhosis.  - Has not had variceal ppx, but BP is borderline anyway.  - All other health maintenance is up to date.  - GI is referring him to Renal. Will f/u renal recommendations.  - Pt. To return in 3 weeks for BP f/u.  - therapeutic paracentesis as needed.

## 2015-05-06 NOTE — Assessment & Plan Note (Signed)
Ongoing follow up for Hepatic Cirrhosis. Presumptively ETOH cirrhosis Child's B at this point. Have not received records from GI yet. Had a second therapeutic paracentesis two days ago without complications. No signs of BP. He has ongoing follow up with Dr. Paulita Fujita of GI. Recently discontinued Lasix and Aldactone given ? Kidney function.  - will check laboratory work today given that we still do not have records from GI. - CMET, CBC, Hepatitis, PT/ INR.  - Discontinuing Lisinopril today given low BP's in cirrhosis.  - Has not had variceal ppx, but BP is borderline anyway.  - All other health maintenance is up to date.  - GI is referring him to Renal. Will f/u renal recommendations.  - Pt. To return in 3 weeks for BP f/u.  - therapeutic paracentesis as needed.

## 2015-05-06 NOTE — Patient Instructions (Signed)
Thanks for coming in today.   I will call you with the lab results.   When you see the kidney doctor, and Dr. Paulita Fujita, then be sure to have them resend the medical records.   We are stopping your Lisinopril today. We will have you return in 3 weeks for follow up of your blood pressure and Cirrhosis.   Thanks for letting us take care of you.   Let us know if you need anything in the meantime.   Sincerely,  Paula Compton, MD Family Medicine - PGY 2

## 2015-05-07 LAB — ACUTE HEP PANEL AND HEP B SURFACE AB
HCV AB: NEGATIVE
HEP A IGM: NONREACTIVE
HEP B C IGM: NONREACTIVE
HEP B S AB: NEGATIVE
Hepatitis B Surface Ag: NEGATIVE

## 2015-05-15 ENCOUNTER — Encounter (HOSPITAL_COMMUNITY): Admission: RE | Admit: 2015-05-15 | Payer: Medicare Other | Source: Ambulatory Visit

## 2015-05-22 DIAGNOSIS — D631 Anemia in chronic kidney disease: Secondary | ICD-10-CM | POA: Diagnosis not present

## 2015-05-22 DIAGNOSIS — N183 Chronic kidney disease, stage 3 (moderate): Secondary | ICD-10-CM | POA: Diagnosis not present

## 2015-05-22 DIAGNOSIS — Z720 Tobacco use: Secondary | ICD-10-CM | POA: Diagnosis not present

## 2015-05-22 DIAGNOSIS — N2581 Secondary hyperparathyroidism of renal origin: Secondary | ICD-10-CM | POA: Diagnosis not present

## 2015-05-22 DIAGNOSIS — I129 Hypertensive chronic kidney disease with stage 1 through stage 4 chronic kidney disease, or unspecified chronic kidney disease: Secondary | ICD-10-CM | POA: Diagnosis not present

## 2015-05-26 ENCOUNTER — Other Ambulatory Visit: Payer: Self-pay | Admitting: Nephrology

## 2015-05-26 ENCOUNTER — Telehealth: Payer: Self-pay | Admitting: Obstetrics and Gynecology

## 2015-05-26 DIAGNOSIS — N183 Chronic kidney disease, stage 3 unspecified: Secondary | ICD-10-CM

## 2015-05-26 NOTE — Telephone Encounter (Signed)
Would like to have an appt sometime this week  to have the fluid drained off his stomach hs

## 2015-05-26 NOTE — Telephone Encounter (Signed)
Patient has appointment scheduled for tomorrow.

## 2015-05-27 ENCOUNTER — Ambulatory Visit (INDEPENDENT_AMBULATORY_CARE_PROVIDER_SITE_OTHER): Payer: Medicare Other | Admitting: Obstetrics and Gynecology

## 2015-05-27 ENCOUNTER — Encounter: Payer: Self-pay | Admitting: Obstetrics and Gynecology

## 2015-05-27 VITALS — BP 132/68 | HR 68 | Temp 98.3°F | Wt 152.2 lb

## 2015-05-27 DIAGNOSIS — N189 Chronic kidney disease, unspecified: Secondary | ICD-10-CM

## 2015-05-27 DIAGNOSIS — K7460 Unspecified cirrhosis of liver: Secondary | ICD-10-CM | POA: Diagnosis not present

## 2015-05-27 DIAGNOSIS — I10 Essential (primary) hypertension: Secondary | ICD-10-CM | POA: Diagnosis not present

## 2015-05-27 DIAGNOSIS — R188 Other ascites: Secondary | ICD-10-CM

## 2015-05-27 DIAGNOSIS — N184 Chronic kidney disease, stage 4 (severe): Secondary | ICD-10-CM | POA: Insufficient documentation

## 2015-05-27 DIAGNOSIS — N183 Chronic kidney disease, stage 3 unspecified: Secondary | ICD-10-CM | POA: Insufficient documentation

## 2015-05-27 LAB — COMPREHENSIVE METABOLIC PANEL
ALK PHOS: 83 U/L (ref 40–115)
ALT: 12 U/L (ref 9–46)
AST: 21 U/L (ref 10–35)
Albumin: 3.5 g/dL — ABNORMAL LOW (ref 3.6–5.1)
BILIRUBIN TOTAL: 0.7 mg/dL (ref 0.2–1.2)
BUN: 27 mg/dL — AB (ref 7–25)
CO2: 20 mmol/L (ref 20–31)
Calcium: 9.2 mg/dL (ref 8.6–10.3)
Chloride: 110 mmol/L (ref 98–110)
Creat: 1.81 mg/dL — ABNORMAL HIGH (ref 0.70–1.18)
GLUCOSE: 93 mg/dL (ref 65–99)
POTASSIUM: 5.3 mmol/L (ref 3.5–5.3)
Sodium: 137 mmol/L (ref 135–146)
Total Protein: 7 g/dL (ref 6.1–8.1)

## 2015-05-27 MED ORDER — FLUTICASONE-SALMETEROL 500-50 MCG/DOSE IN AEPB: INHALATION_SPRAY | RESPIRATORY_TRACT | Status: DC

## 2015-05-27 MED ORDER — ALBUTEROL SULFATE HFA 108 (90 BASE) MCG/ACT IN AERS: INHALATION_SPRAY | RESPIRATORY_TRACT | Status: DC

## 2015-05-27 NOTE — Progress Notes (Addendum)
     Subjective: Chief Complaint  Patient presents with  . Follow-up    Hypertension;Cirrhosis    HPI: Donald Moreno is a 75 y.o. presenting to clinic today to discuss the following:  #Hypertension Blood pressure at home: Does not take pressures Blood pressure today: wnl Taking Meds:  No, discontinued at last office visit due to hypotension Side effects: None. ROS: Denies headache, dizziness, visual changes, nausea, vomiting, chest pain, or shortness of breath.  #Cirrhosis - no longer taking fluid pills either due to hypotension -feels like he has accumulation of fluid again -abdomen is really tight; no abdominal pain currently -follows with [facility], Dr. Paulita Fujita ; next appointment in 2 months see GI doctor -believes he needs paracentesis again - last time he had about 2 months ago and took off about 6L -has gained weight believe it is fluid weight. Baseline weight ~140s ROS: no abdominal tenderness, fevers, weight loss, bleeding  #CKD  -recently started going to [facility] -has new diagnosis of CKD -when he was hypotensive he also had AKI.   ROS noted in HPI.  Past Medical, Surgical, Social, and Family History Reviewed & Updated per EMR.   Objective: BP 132/68 mmHg  Pulse 68  Temp(Src) 98.3 F (36.8 C) (Oral)  Wt 152 lb 3.2 oz (69.037 kg) Vitals and nursing notes reviewed  Physical Exam  Constitutional: He is oriented to person, place, and time.  Well-appearing, non-toxic, NAD, thin AAM  Cardiovascular: Normal rate and regular rhythm.   Pulmonary/Chest: Effort normal. He has wheezes. He has no rales.  Abdominal: He exhibits distension and ascites. There is hepatomegaly. There is no tenderness.  Musculoskeletal: He exhibits no edema.  Neurological: He is alert and oriented to person, place, and time.  Skin: Skin is warm and dry.    Assessment/Plan: Please see problem based Assessment and Plan   Orders Placed This Encounter  Procedures  .  Comprehensive metabolic panel    Order Specific Question:  Has the patient fasted?    Answer:  No    Meds ordered this encounter  Medications  . albuterol (PROAIR HFA) 108 (90 Base) MCG/ACT inhaler    Sig: take 2 puffs every 4 hours if needed for wheezing or shortness of breath    Dispense:  8.5 g    Refill:  5  . Fluticasone-Salmeterol (ADVAIR DISKUS) 500-50 MCG/DOSE AEPB    Sig: Inhale 1 puff two times  daily    Dispense:  120 each    Refill:  [place], DO 05/27/2015, 8:35 AM PGY-2, [place]

## 2015-05-27 NOTE — Patient Instructions (Signed)
Mr. Rosati it was great to see you today!  Here are some of the things we discussed today: -Please go and release records from [facility] so I can see what they are working up -Someone will call you about scheduling a therapeutic tap to get fluid off abdomen -Will let you know results of your blood work -Will continue to hold the BP medication. Also avoid NSAIDs and ibuprofen as this can damage kidneys further  New medications: -None  Please schedule a follow-up appointment for 6 weeks. I want to follow you more frequently.  Thanks for allowing me to be a part of your care! Dr. Gerarda Fraction

## 2015-05-29 ENCOUNTER — Encounter: Payer: Self-pay | Admitting: Obstetrics and Gynecology

## 2015-05-29 ENCOUNTER — Ambulatory Visit
Admission: RE | Admit: 2015-05-29 | Discharge: 2015-05-29 | Disposition: A | Discharge status: Home / Self Care | Payer: Medicare Other | Source: Ambulatory Visit | Attending: Nephrology | Admitting: Nephrology

## 2015-05-29 DIAGNOSIS — N183 Chronic kidney disease, stage 3 unspecified: Secondary | ICD-10-CM

## 2015-05-29 NOTE — Assessment & Plan Note (Signed)
Unknown at this time what new baseline might be. Patient has fluctuated between Cr of .8-1.2. Recently with AKI. Had referral placed to [facility] by GI. Patient to send those records to my office. Will collect CMP today to see if renal function improving. Discussed avoidance of NSAIDs and other nephrotoxic agents.

## 2015-05-29 NOTE — Assessment & Plan Note (Signed)
Abdominal distention and accumulation of ascites again. Therapeutic paracentesis scheduled for patient. Appears patient is going toward needing regularly scheduled taps.

## 2015-05-29 NOTE — Assessment & Plan Note (Signed)
BPs now appropriate and not low. Continue to hold BP medications at this time. Patient denies any more hypotensive symptoms. Add back medications as appropriate but start with lowest dose.

## 2015-05-29 NOTE — Assessment & Plan Note (Signed)
Ongoing hepatic cirrhosis with recurrent asitics needing therapeutic paracentesis about every 2-3 months. Most likely cause of his cirrhosis is due to alcohol abuse. Hep labs were negative. No signs of SBP a this time. Vitals are stable. Does have moderate abdominal distention but without tenderness or pain.  -continue to hold off on diuretic regimen in setting of AKI and hypotension -Will order a therapeutic paracentesis today -CMET ordered today -will contact GI doctor about helping schedule regular taps -RTC in 6 weeks; patient will need regular follow-up with me.

## 2015-06-01 ENCOUNTER — Ambulatory Visit (HOSPITAL_COMMUNITY)
Admission: RE | Admit: 2015-06-01 | Discharge: 2015-06-01 | Disposition: A | Discharge status: Home / Self Care | Payer: Medicare Other | Source: Ambulatory Visit | Attending: Family Medicine | Admitting: Family Medicine

## 2015-06-01 DIAGNOSIS — K7460 Unspecified cirrhosis of liver: Secondary | ICD-10-CM

## 2015-06-01 DIAGNOSIS — R188 Other ascites: Secondary | ICD-10-CM | POA: Insufficient documentation

## 2015-06-01 DIAGNOSIS — N189 Chronic kidney disease, unspecified: Secondary | ICD-10-CM | POA: Diagnosis not present

## 2015-06-01 NOTE — Procedures (Signed)
Ultrasound-guided therapeutic paracentesis performed yielding 10 L slightly hazy, yellow fluid. No immediate complications.

## 2015-06-11 ENCOUNTER — Other Ambulatory Visit: Payer: Self-pay | Admitting: Obstetrics and Gynecology

## 2015-06-11 ENCOUNTER — Telehealth: Payer: Self-pay | Admitting: Obstetrics and Gynecology

## 2015-06-11 NOTE — Telephone Encounter (Signed)
Patient needs to be referred to have fluid drawn again.  He wants to do this [birth year] his [birth year].

## 2015-06-11 NOTE — Telephone Encounter (Signed)
Will forward to Dr. Gerarda Fraction to place another order. Jazmin Hartsell,CMA

## 2015-06-11 NOTE — Telephone Encounter (Signed)
Spoke to patient's GI doctor (Dr. Paulita Fujita) about his continued need for paracentesis. Dr. Paulita Fujita stated patient should call their office to get scheduled for regular taps.

## 2015-06-11 NOTE — Telephone Encounter (Signed)
Patient is aware of this and will plan to contact GI. Richie Vadala,CMA

## 2015-06-12 ENCOUNTER — Other Ambulatory Visit (HOSPITAL_COMMUNITY): Payer: Self-pay | Admitting: Gastroenterology

## 2015-06-12 ENCOUNTER — Encounter (HOSPITAL_COMMUNITY): Payer: Medicare Other

## 2015-06-12 DIAGNOSIS — R188 Other ascites: Secondary | ICD-10-CM

## 2015-06-12 DIAGNOSIS — K7460 Unspecified cirrhosis of liver: Secondary | ICD-10-CM

## 2015-06-22 ENCOUNTER — Other Ambulatory Visit: Payer: Self-pay

## 2015-06-22 ENCOUNTER — Emergency Department (HOSPITAL_COMMUNITY)
Admission: EM | Admit: 2015-06-22 | Discharge: 2015-06-22 | Disposition: A | Discharge status: Home / Self Care | Payer: Medicare Other | Attending: Emergency Medicine | Admitting: Emergency Medicine

## 2015-06-22 ENCOUNTER — Ambulatory Visit (HOSPITAL_COMMUNITY)
Admission: RE | Admit: 2015-06-22 | Discharge: 2015-06-22 | Disposition: A | Discharge status: Home / Self Care | Payer: Medicare Other | Source: Ambulatory Visit | Attending: Gastroenterology | Admitting: Gastroenterology

## 2015-06-22 ENCOUNTER — Encounter (HOSPITAL_COMMUNITY): Payer: Self-pay | Admitting: Emergency Medicine

## 2015-06-22 ENCOUNTER — Encounter (HOSPITAL_COMMUNITY)
Admission: RE | Admit: 2015-06-22 | Discharge: 2015-06-22 | Disposition: A | Discharge status: Home / Self Care | Payer: Medicare Other | Source: Ambulatory Visit | Attending: Gastroenterology | Admitting: Gastroenterology

## 2015-06-22 ENCOUNTER — Encounter (HOSPITAL_COMMUNITY): Payer: Self-pay

## 2015-06-22 DIAGNOSIS — E869 Volume depletion, unspecified: Secondary | ICD-10-CM | POA: Insufficient documentation

## 2015-06-22 DIAGNOSIS — M109 Gout, unspecified: Secondary | ICD-10-CM | POA: Insufficient documentation

## 2015-06-22 DIAGNOSIS — J45909 Unspecified asthma, uncomplicated: Secondary | ICD-10-CM | POA: Diagnosis not present

## 2015-06-22 DIAGNOSIS — R188 Other ascites: Secondary | ICD-10-CM | POA: Insufficient documentation

## 2015-06-22 DIAGNOSIS — R000 Tachycardia, unspecified: Secondary | ICD-10-CM | POA: Diagnosis not present

## 2015-06-22 DIAGNOSIS — Z7952 Long term (current) use of systemic steroids: Secondary | ICD-10-CM | POA: Insufficient documentation

## 2015-06-22 DIAGNOSIS — K7460 Unspecified cirrhosis of liver: Secondary | ICD-10-CM

## 2015-06-22 DIAGNOSIS — I10 Essential (primary) hypertension: Secondary | ICD-10-CM | POA: Insufficient documentation

## 2015-06-22 DIAGNOSIS — Z79899 Other long term (current) drug therapy: Secondary | ICD-10-CM | POA: Diagnosis not present

## 2015-06-22 DIAGNOSIS — K7031 Alcoholic cirrhosis of liver with ascites: Secondary | ICD-10-CM | POA: Insufficient documentation

## 2015-06-22 DIAGNOSIS — Z7982 Long term (current) use of aspirin: Secondary | ICD-10-CM | POA: Diagnosis not present

## 2015-06-22 LAB — BASIC METABOLIC PANEL
ANION GAP: 8 (ref 5–15)
BUN: 23 mg/dL — AB (ref 6–20)
CO2: 21 mmol/L — AB (ref 22–32)
Calcium: 8.8 mg/dL — ABNORMAL LOW (ref 8.9–10.3)
Chloride: 106 mmol/L (ref 101–111)
Creatinine, Ser: 1.43 mg/dL — ABNORMAL HIGH (ref 0.61–1.24)
GFR calc Af Amer: 55 mL/min — ABNORMAL LOW (ref >60)
GFR calc non Af Amer: 47 mL/min — ABNORMAL LOW (ref >60)
GLUCOSE: 124 mg/dL — AB (ref 65–99)
POTASSIUM: 4.3 mmol/L (ref 3.5–5.1)
Sodium: 135 mmol/L (ref 135–145)

## 2015-06-22 LAB — CBC
HEMATOCRIT: 28.9 % — AB (ref 39.0–52.0)
Hemoglobin: 9.8 g/dL — ABNORMAL LOW (ref 13.0–17.0)
MCH: 34 pg (ref 26.0–34.0)
MCHC: 33.9 g/dL (ref 30.0–36.0)
MCV: 100.3 fL — AB (ref 78.0–100.0)
Platelets: 133 10*3/uL — ABNORMAL LOW (ref 150–400)
RBC: 2.88 MIL/uL — AB (ref 4.22–5.81)
RDW: 14.1 % (ref 11.5–15.5)
WBC: 5.1 10*3/uL (ref 4.0–10.5)

## 2015-06-22 MED ORDER — SODIUM CHLORIDE 0.9 % IV SOLN
Freq: Once | INTRAVENOUS | Status: AC
  Administered 2015-06-22: 13:00:00 via INTRAVENOUS

## 2015-06-22 MED ORDER — SODIUM CHLORIDE 0.9 % IV BOLUS (SEPSIS)
1000.0000 mL | Freq: Once | INTRAVENOUS | Status: AC
  Administered 2015-06-22: 1000 mL via INTRAVENOUS

## 2015-06-22 MED ORDER — METOPROLOL TARTRATE 1 MG/ML IV SOLN
5.0000 mg | Freq: Once | INTRAVENOUS | Status: AC
  Administered 2015-06-22: 5 mg via INTRAVENOUS
  Filled 2015-06-22: qty 5

## 2015-06-22 MED ORDER — ALBUMIN HUMAN 25 % IV SOLN
50.0000 g | Freq: Once | INTRAVENOUS | Status: AC
  Administered 2015-06-22: 50 g via INTRAVENOUS
  Filled 2015-06-22: qty 200

## 2015-06-22 MED ORDER — ADENOSINE 6 MG/2ML IV SOLN
18.0000 mg | Freq: Once | INTRAVENOUS | Status: AC
  Administered 2015-06-22: 18 mg via INTRAVENOUS
  Filled 2015-06-22: qty 6

## 2015-06-22 NOTE — ED Notes (Signed)
Pt coming from short stay. Pt had paracentesis earlier today and in recovery pt had heart rate of 130. Pt received 50g albumin in short stay and pulled off 7 L of fluid. Dr. Paulita Fujita is pt's medical doctor. Pt appears to be asymptomatic, no complaints. A&Ox4. Pt ambulatory.

## 2015-06-22 NOTE — Progress Notes (Addendum)
Patient ID: Donald Moreno, male   DOB: 01/02/1941, 75 y.o.   MRN: [id number] Pt s/p large volume paracentesis today( 7 liters) with subsequent albumin replacement (50 grams). He is asymptomatic. His heart rate is consistently tachycardic(130's) but regular. AF; BP ok; O2 sats 100 % RA; case d/w ordering MD (Dr. Paulita Fujita) and he prefers that pt be evaluated in ED before discharge. Pt informed.

## 2015-06-22 NOTE — ED Provider Notes (Addendum)
CSN: [id number]     Arrival date & time 06/22/15  1415 History   First MD Initiated Contact with Patient 06/22/15 1424     Chief Complaint  Patient presents with  . Tachycardia      HPI Patient has a history of cirrhosis and was at short stay today where he had 7 L drained from his peritoneum, and was given 50 g of albumin.  He presents to the emergency department after he was found to have heart rate of 1:30.  Patient is completely asymptomatic.  He denies chest pain or palpitations.  No shortness of breath.  He denies weakness.  No recent fever or chills.  He reports no lightheadedness.  No history of A. fib or atrial flutter.  No other complaints.   Past Medical History  Diagnosis Date  . Arthritis   . Gout   . Asthma   . Hypertension   . ETOH abuse    History reviewed. No pertinent past surgical history. Family History  Problem Relation Age of Onset  . Asthma Sister   . Heart disease Sister     multiple stents.  . Asthma Brother   . Other Mother     died when pt was only 84 - unknown cause.  . Other Father     deceased.   Social History  Substance Use Topics  . Smoking status: Never Smoker   . Smokeless tobacco: Current User  . Alcohol Use: 1.2 oz/week    2 Cans of beer per week    Review of Systems  All other systems reviewed and are negative.     Allergies  Review of patient's allergies indicates no known allergies.  Home Medications   Prior to Admission medications   Medication Sig Start Date End Date Taking? Authorizing Provider  albuterol (PROAIR HFA) 108 (90 Base) MCG/ACT inhaler take 2 puffs every 4 hours if needed for wheezing or shortness of breath 05/27/15   Katheren Shams, DO  allopurinol (ZYLOPRIM) 300 MG tablet Take 1 tablet by mouth  daily 02/09/15   Katheren Shams, DO  aspirin 81 MG tablet Take 81 mg by mouth daily.    Historical Provider, MD  Cholecalciferol (VITAMIN D3) 1000 UNITS CAPS Take 1 capsule (1,000 Units total) by mouth daily.  03/05/15   Katheren Shams, DO  Emollient (CERAVE) LOTN Apply to dry skin daily. 03/04/15   Katheren Shams, DO  ferrous sulfate 325 (65 FE) MG tablet Take 1 tablet (325 mg total) by mouth 2 (two) times daily with a meal. 10/21/14   Ashly Windell Moulding, DO  Fluticasone-Salmeterol (ADVAIR DISKUS) 500-50 MCG/DOSE AEPB Inhale 1 puff two times  daily 05/27/15   Katheren Shams, DO  montelukast (SINGULAIR) 10 MG tablet Take 1 tablet (10 mg total) by mouth at bedtime. 07/29/14   Katheren Shams, DO  Nutritional Supplements (ENSURE COMPLETE SHAKE) LIQD Take 1 Container by mouth daily. 03/04/15   Katheren Shams, DO  pantoprazole (PROTONIX) 40 MG tablet Take 1 tablet (40 mg total) by mouth 2 (two) times daily. 10/20/14   Ashly Windell Moulding, DO  sucralfate (CARAFATE) 1 G tablet Take 1 tablet (1 g total) by mouth 4 (four) times daily -  with meals and at bedtime. Patient not taking: Reported on 03/04/2015 10/21/14   Janora Norlander, DO  traMADol (ULTRAM) 50 MG tablet Take 1 tablet (50 mg total) by mouth every 6 (six) hours as needed. Patient not taking: Reported on 03/04/2015  01/24/15   Milton Ferguson, MD   BP 124/91 mmHg  Pulse 130  Temp(Src) 97.8 F (36.6 C) (Oral)  Resp 17  SpO2 100% Physical Exam  Constitutional: He is oriented to person, place, and time. He appears well-developed and well-nourished.  HENT:  Head: Normocephalic and atraumatic.  Eyes: EOM are normal.  Neck: Normal range of motion.  Cardiovascular: Regular rhythm, normal heart sounds and intact distal pulses.   Tachycardia  Pulmonary/Chest: Effort normal and breath sounds normal. No respiratory distress.  Abdominal: Soft. He exhibits no distension. There is no tenderness.  Musculoskeletal: Normal range of motion.  Neurological: He is alert and oriented to person, place, and time.  Skin: Skin is warm and dry.  Psychiatric: He has a normal mood and affect. Judgment normal.  Nursing note and vitals reviewed.   ED Course  Procedures  (including critical care time) Labs Review Labs Reviewed  CBC - Abnormal; Notable for the following:    RBC 2.88 (*)    Hemoglobin 9.8 (*)    HCT 28.9 (*)    MCV 100.3 (*)    Platelets 133 (*)    All other components within normal limits  BASIC METABOLIC PANEL - Abnormal; Notable for the following:    CO2 21 (*)    Glucose, Bld 124 (*)    BUN 23 (*)    Creatinine, Ser 1.43 (*)    Calcium 8.8 (*)    GFR calc non Af Amer 47 (*)    GFR calc Af Amer 55 (*)    All other components within normal limits   BUN  Date Value Ref Range Status  06/22/2015 23* 6 - 20 mg/dL Final  05/27/2015 27* 7 - 25 mg/dL Final  05/06/2015 55* 7 - 25 mg/dL Final  04/06/2015 25 7 - 25 mg/dL Final   CREAT  Date Value Ref Range Status  05/27/2015 1.81* 0.70 - 1.18 mg/dL Final  05/06/2015 2.89* 0.70 - 1.18 mg/dL Final  04/06/2015 1.18 0.70 - 1.18 mg/dL Final  03/31/2015 1.20* 0.70 - 1.18 mg/dL Final   CREATININE, SER  Date Value Ref Range Status  06/22/2015 1.43* 0.61 - 1.24 mg/dL Final  01/24/2015 1.37* 0.61 - 1.24 mg/dL Final  10/20/2014 0.95 0.61 - 1.24 mg/dL Final  10/19/2014 1.11 0.61 - 1.24 mg/dL Final       Imaging Review US Paracentesis  06/22/2015  INDICATION: Cirrhosis, recurrent ascites. Request is made for therapeutic paracentesis up to 7 liters. EXAM: ULTRASOUND GUIDED THERAPEUTIC PARACENTESIS MEDICATIONS: None. COMPLICATIONS: None immediate. PROCEDURE: Informed written consent was obtained from the patient after a discussion of the risks, benefits and alternatives to treatment. A timeout was performed prior to the initiation of the procedure. Initial ultrasound scanning demonstrates a large amount of ascites within the right lower abdominal quadrant. The right lower abdomen was prepped and draped in the usual sterile fashion. 1% lidocaine was used for local anesthesia. Following this, a Yueh catheter was introduced. An ultrasound image was saved for documentation purposes. The paracentesis  was performed. The catheter was removed and a dressing was applied. The patient tolerated the procedure well without immediate post procedural complication. FINDINGS: A total of approximately 7 liters of slightly hazy, yellow fluid was removed. IMPRESSION: Successful ultrasound-guided therapeutic paracentesis yielding 7 liters liters of peritoneal fluid. The patient will receive IV albumin infusion postprocedure . Read by: Rowe Hulon, PA-C Electronically Signed   By: Aletta Edouard M.D.   On: 06/22/2015 12:29   I have personally reviewed and  evaluated these images and lab results as part of my medical decision-making.   EKG Interpretation   Date/Time:  Monday June 22 2015 14:18:46 EST Ventricular Rate:  131 PR Interval:  97 QRS Duration: 97 QT Interval:  339 QTC Calculation: 500 R Axis:   52 Text Interpretation:  Sinus or ectopic atrial tachycardia Abnormal R-wave  progression, early transition Prolonged QT interval HR increased compared  to prior ecg Confirmed by Tyliek Timberman  MD, Lennette Bihari ([id number]) on 06/22/2015 2:25:17 PM      MDM   Final diagnoses:  None    The patient's initial rate was so regular 130 this was thought to be possibly 2:1 atrial flutter.  He was given 6 mg of adenosine followed by 12 mg of adenosine for electrical pause to determine if there are underlying flutter waves.  There were no underlying atrial flutter waves.  Patient received IV fluids and his heart rate improved to 116.  He is asymptomatic at this time.  No indication for additional workup.  Sinus tachycardia resolving with IV fluids likely related to large volume paracentesis.  Patient was able stand at the edge of the bed without any lightheadedness.  Discharge home in good condition.    Jola Schmidt, MD 06/23/15 [id number]  Jola Schmidt, MD 06/23/15 [phone]

## 2015-06-22 NOTE — Progress Notes (Signed)
Rowe Madeline PA Radiology here to check on patient. Pulse 135 and BP 120/89. Pt watching TV and drinking ginger ale. States he feels fine

## 2015-06-22 NOTE — ED Notes (Signed)
Bed: [id number] Expected date:  Expected time:  Means of arrival:  Comments: Pt from Short Stay

## 2015-06-22 NOTE — Progress Notes (Signed)
Rowe Slaton PA spoke to patient's medical doctor Dr. Paulita Fujita regarding pulse in the 130's. He stated to take patient to the ER now. Calling ER to make them aware.

## 2015-06-22 NOTE — Discharge Instructions (Signed)
Albumin injection °What is this medicine? °ALBUMIN (al BYOO min) is used to treat or prevent shock following serious injury, bleeding, surgery, or burns by increasing the volume of blood plasma. This medicine can also replace low blood protein. °This medicine may be used for other purposes; ask your health care provider or pharmacist if you have questions. °What should I tell my health care provider before I take this medicine? °They need to know if you have any of the following conditions: °-anemia °-heart disease °-kidney disease °-an unusual or allergic reaction to albumin, other medicines, foods, dyes, or preservatives °-pregnant or trying to get pregnant °-breast-feeding °How should I use this medicine? °This medicine is for infusion into a vein. It is given by a health-care professional in a hospital or clinic. °Talk to your pediatrician regarding the use of this medicine in children. While this drug may be prescribed for selected conditions, precautions do apply. °Overdosage: If you think you have taken too much of this medicine contact a poison control center or emergency room at once. °NOTE: This medicine is only for you. Do not share this medicine with others. °What if I miss a dose? °This does not apply. °What may interact with this medicine? °Interactions are not expected. °This list may not describe all possible interactions. Give your health care provider a list of all the medicines, herbs, non-prescription drugs, or dietary supplements you use. Also tell them if you smoke, drink alcohol, or use illegal drugs. Some items may interact with your medicine. °What should I watch for while using this medicine? °Your condition will be closely monitored while you receive this medicine. °Some products are derived from human plasma, and there is a small risk that these products may contain certain types of virus or bacteria. All products are processed to kill most viruses and bacteria. If you have questions  concerning the risk of infections, discuss them with your doctor or health care professional. °What side effects may I notice from receiving this medicine? °Side effects that you should report to your doctor or health care professional as soon as possible: °-allergic reactions like skin rash, itching or hives, swelling of the face, lips, or tongue °-breathing problems °-changes in heartbeat °-fever, chills °-pain, redness or swelling at the injection site °-signs of viral infection including fever, drowsiness, chills, runny nose followed in about 2 weeks by a rash and joint pain °-tightness in the chest °Side effects that usually do not require medical attention (report to your doctor or health care professional if they continue or are bothersome): °-increased salivation °-nausea, vomiting °This list may not describe all possible side effects. Call your doctor for medical advice about side effects. You may report side effects to FDA at 1-800-FDA-1088. °Where should I keep my medicine? °This does not apply. You will not be given this medicine to store at home. °NOTE: This sheet is a summary. It may not cover all possible information. If you have questions about this medicine, talk to your doctor, pharmacist, or health care provider. °  °© 2016, [employer]vier/[employer]. (2007-07-26 10:18:55) °Paracentesis °Paracentesis is a procedure to remove excess fluid (ascites) from the belly (abdomen). Ascites can result from certain conditions, such as infection, inflammation, abdominal injury, heart failure, chronic scarring of the liver (cirrhosis), or cancer. Ascites is removed using a needle that is inserted through the skin and tissue into the abdomen. °This procedure may be done: °· To determine the cause of the ascites. °· To relieve symptoms that are caused   by the ascites, such as pain or shortness of breath. °· To see if there is bleeding after an abdominal injury. °LET YOUR HEALTH CARE PROVIDER KNOW ABOUT: °· Any  allergies you have. °· All medicines you are taking, including vitamins, herbs, eye drops, creams, and over-the-counter medicines. °· Previous problems you or members of your family have had with the use of anesthetics. °· Any blood disorders you have. °· Previous surgeries you have had. °· Any medical conditions you have. °· Whether you are pregnant or may be pregnant. °RISKS AND COMPLICATIONS °Generally, this is a safe procedure. However, problems may occur, including: °· Infection. °· Bleeding. °· Injury to an abdominal organ, such as the bowel (large intestine), liver, spleen, or bladder. °· Low blood pressure (hypotension). °· Spreading of cancer, if there are cancer cells in the abdominal fluid. °· Mental status changes in people who have liver disease. These changes would be caused by shifts in the balance of fluids and minerals (electrolytes) in the body. °BEFORE THE PROCEDURE °· Ask your health care provider about: °¨ Changing or stopping your regular medicines. This is especially important if you are taking diabetes medicines or blood thinners. °¨ Taking medicines such as aspirin and ibuprofen. These medicines can thin your blood. Do not take these medicines before your procedure if your health care provider instructs you not to. °· A blood sample may be done to determine your blood clotting time. °· You will be asked to urinate. °PROCEDURE °· You may be asked to lie on your back with your head raised (elevated). °· To reduce your risk of infection: °¨ Your health care team will wash or sanitize their hands. °¨ Your skin will be washed with soap. °· You will be given a medicine to numb the area (local anesthetic). °· Your abdominal skin will be punctured with a needle or a scalpel. °· A drainage tube will be inserted through the puncture site. Fluid will drain through the tube into a container. °· After enough fluid has been removed, the tube will be removed. °· A sample of the fluid will be sent for  examination. °· A bandage (dressing) will be placed over the puncture site. °The procedure may vary among health care providers and hospitals. °AFTER THE PROCEDURE °· It is your responsibility to get your test results. Ask your health care provider or the department performing the test when your results will be ready. °  °This information is not intended to replace advice given to you by your health care provider. Make sure you discuss any questions you have with your health care provider. °  °Document Released: 11/15/2004 Document Revised: 01/21/2015 Document Reviewed: 07/15/2014 °Elsevier Interactive Patient Education ©2016 [employer]. ° ° °

## 2015-06-22 NOTE — Procedures (Signed)
Ultrasound-guided  therapeutic paracentesis performed yielding 7  liters (maximum ordered) of slightly hazy, yellow  fluid. No immediate complications. The patient will receive IV albumin infusion post procedure.

## 2015-06-22 NOTE — Progress Notes (Addendum)
Pt arrived here today from Ultrasound radiology via w/c post paracentesis. Pulse is 136 per dynamap and then 132 radially (pulse feels regular). BP it 115/78 . Pt is warm and dry denies any discomfort and states he feel fine. No previous documentation of his pulse in this record. Spoke with Rowe Adar PA radiology who did the paracentesis and informed him of this. If patient is asymptomatic I am to proceed with Albumin infusion

## 2015-06-22 NOTE — Progress Notes (Signed)
Albumin has infused Pt still feels fine and is eager to go home. BP 124/75 p 133 regular. OK to discharge per Rowe Melanie PA radiology

## 2015-07-01 ENCOUNTER — Emergency Department (HOSPITAL_COMMUNITY): Payer: Medicare Other

## 2015-07-01 ENCOUNTER — Inpatient Hospital Stay (HOSPITAL_COMMUNITY)
Admission: EM | Admit: 2015-07-01 | Discharge: 2015-07-07 | DRG: 432 | Disposition: A | Discharge status: Home Health | Payer: Medicare Other | Attending: Family Medicine | Admitting: Family Medicine

## 2015-07-01 ENCOUNTER — Encounter (HOSPITAL_COMMUNITY): Payer: Self-pay | Admitting: Family Medicine

## 2015-07-01 DIAGNOSIS — R140 Abdominal distension (gaseous): Secondary | ICD-10-CM | POA: Diagnosis not present

## 2015-07-01 DIAGNOSIS — K767 Hepatorenal syndrome: Secondary | ICD-10-CM | POA: Diagnosis present

## 2015-07-01 DIAGNOSIS — J45909 Unspecified asthma, uncomplicated: Secondary | ICD-10-CM | POA: Diagnosis not present

## 2015-07-01 DIAGNOSIS — R188 Other ascites: Secondary | ICD-10-CM

## 2015-07-01 DIAGNOSIS — M109 Gout, unspecified: Secondary | ICD-10-CM | POA: Diagnosis not present

## 2015-07-01 DIAGNOSIS — D649 Anemia, unspecified: Secondary | ICD-10-CM | POA: Diagnosis not present

## 2015-07-01 DIAGNOSIS — Z6822 Body mass index (BMI) 22.0-22.9, adult: Secondary | ICD-10-CM

## 2015-07-01 DIAGNOSIS — R64 Cachexia: Secondary | ICD-10-CM | POA: Diagnosis not present

## 2015-07-01 DIAGNOSIS — R000 Tachycardia, unspecified: Secondary | ICD-10-CM | POA: Insufficient documentation

## 2015-07-01 DIAGNOSIS — F1021 Alcohol dependence, in remission: Secondary | ICD-10-CM | POA: Diagnosis not present

## 2015-07-01 DIAGNOSIS — I483 Typical atrial flutter: Secondary | ICD-10-CM | POA: Insufficient documentation

## 2015-07-01 DIAGNOSIS — I440 Atrioventricular block, first degree: Secondary | ICD-10-CM | POA: Diagnosis not present

## 2015-07-01 DIAGNOSIS — K652 Spontaneous bacterial peritonitis: Secondary | ICD-10-CM | POA: Diagnosis not present

## 2015-07-01 DIAGNOSIS — E869 Volume depletion, unspecified: Secondary | ICD-10-CM | POA: Diagnosis present

## 2015-07-01 DIAGNOSIS — Z66 Do not resuscitate: Secondary | ICD-10-CM | POA: Diagnosis not present

## 2015-07-01 DIAGNOSIS — Z7951 Long term (current) use of inhaled steroids: Secondary | ICD-10-CM

## 2015-07-01 DIAGNOSIS — N184 Chronic kidney disease, stage 4 (severe): Secondary | ICD-10-CM | POA: Diagnosis present

## 2015-07-01 DIAGNOSIS — I10 Essential (primary) hypertension: Secondary | ICD-10-CM | POA: Diagnosis not present

## 2015-07-01 DIAGNOSIS — Z825 Family history of asthma and other chronic lower respiratory diseases: Secondary | ICD-10-CM

## 2015-07-01 DIAGNOSIS — R109 Unspecified abdominal pain: Secondary | ICD-10-CM | POA: Diagnosis present

## 2015-07-01 DIAGNOSIS — K7031 Alcoholic cirrhosis of liver with ascites: Principal | ICD-10-CM | POA: Diagnosis present

## 2015-07-01 DIAGNOSIS — N183 Chronic kidney disease, stage 3 unspecified: Secondary | ICD-10-CM | POA: Diagnosis present

## 2015-07-01 DIAGNOSIS — I868 Varicose veins of other specified sites: Secondary | ICD-10-CM | POA: Diagnosis not present

## 2015-07-01 DIAGNOSIS — E43 Unspecified severe protein-calorie malnutrition: Secondary | ICD-10-CM | POA: Insufficient documentation

## 2015-07-01 DIAGNOSIS — F17200 Nicotine dependence, unspecified, uncomplicated: Secondary | ICD-10-CM | POA: Diagnosis present

## 2015-07-01 DIAGNOSIS — I471 Supraventricular tachycardia: Secondary | ICD-10-CM | POA: Diagnosis not present

## 2015-07-01 DIAGNOSIS — I129 Hypertensive chronic kidney disease with stage 1 through stage 4 chronic kidney disease, or unspecified chronic kidney disease: Secondary | ICD-10-CM | POA: Diagnosis not present

## 2015-07-01 DIAGNOSIS — N182 Chronic kidney disease, stage 2 (mild): Secondary | ICD-10-CM | POA: Diagnosis not present

## 2015-07-01 DIAGNOSIS — R1084 Generalized abdominal pain: Secondary | ICD-10-CM

## 2015-07-01 DIAGNOSIS — I4892 Unspecified atrial flutter: Secondary | ICD-10-CM | POA: Diagnosis not present

## 2015-07-01 DIAGNOSIS — K7030 Alcoholic cirrhosis of liver without ascites: Secondary | ICD-10-CM | POA: Insufficient documentation

## 2015-07-01 DIAGNOSIS — E875 Hyperkalemia: Secondary | ICD-10-CM | POA: Diagnosis not present

## 2015-07-01 DIAGNOSIS — K7460 Unspecified cirrhosis of liver: Secondary | ICD-10-CM | POA: Diagnosis present

## 2015-07-01 HISTORY — DX: Spontaneous bacterial peritonitis: K65.2

## 2015-07-01 LAB — LACTIC ACID, PLASMA
LACTIC ACID, VENOUS: 1.4 mmol/L (ref 0.5–2.0)
Lactic Acid, Venous: 2.2 mmol/L (ref 0.5–2.0)

## 2015-07-01 LAB — CBC
HCT: 34.1 % — ABNORMAL LOW (ref 39.0–52.0)
Hemoglobin: 11.4 g/dL — ABNORMAL LOW (ref 13.0–17.0)
MCH: 33.8 pg (ref 26.0–34.0)
MCHC: 33.4 g/dL (ref 30.0–36.0)
MCV: 101.2 fL — AB (ref 78.0–100.0)
PLATELETS: 181 10*3/uL (ref 150–400)
RBC: 3.37 MIL/uL — ABNORMAL LOW (ref 4.22–5.81)
RDW: 14.2 % (ref 11.5–15.5)
WBC: 7.6 10*3/uL (ref 4.0–10.5)

## 2015-07-01 LAB — LIPASE, BLOOD: LIPASE: 39 U/L (ref 11–51)

## 2015-07-01 LAB — PROTEIN, BODY FLUID

## 2015-07-01 LAB — BODY FLUID CELL COUNT WITH DIFFERENTIAL
Eos, Fluid: 0 %
LYMPHS FL: 68 %
MONOCYTE-MACROPHAGE-SEROUS FLUID: 30 % — AB (ref 50–90)
Neutrophil Count, Fluid: 2 % (ref 0–25)
WBC FLUID: 68 uL (ref 0–1000)

## 2015-07-01 LAB — GLUCOSE, PERITONEAL FLUID: GLUCOSE, PERITONEAL FLUID: 117 mg/dL

## 2015-07-01 LAB — COMPREHENSIVE METABOLIC PANEL
ALT: 17 U/L (ref 17–63)
AST: 30 U/L (ref 15–41)
Albumin: 3.1 g/dL — ABNORMAL LOW (ref 3.5–5.0)
Alkaline Phosphatase: 132 U/L — ABNORMAL HIGH (ref 38–126)
Anion gap: 9 (ref 5–15)
BUN: 25 mg/dL — AB (ref 6–20)
CHLORIDE: 109 mmol/L (ref 101–111)
CO2: 22 mmol/L (ref 22–32)
CREATININE: 1.67 mg/dL — AB (ref 0.61–1.24)
Calcium: 9.2 mg/dL (ref 8.9–10.3)
GFR calc Af Amer: 45 mL/min — ABNORMAL LOW (ref >60)
GFR calc non Af Amer: 39 mL/min — ABNORMAL LOW (ref >60)
GLUCOSE: 115 mg/dL — AB (ref 65–99)
Potassium: 5.5 mmol/L — ABNORMAL HIGH (ref 3.5–5.1)
SODIUM: 140 mmol/L (ref 135–145)
Total Bilirubin: 0.7 mg/dL (ref 0.3–1.2)
Total Protein: 7.1 g/dL (ref 6.5–8.1)

## 2015-07-01 LAB — GRAM STAIN

## 2015-07-01 LAB — URINALYSIS, ROUTINE W REFLEX MICROSCOPIC
GLUCOSE, UA: NEGATIVE mg/dL
HGB URINE DIPSTICK: NEGATIVE
KETONES UR: NEGATIVE mg/dL
Leukocytes, UA: NEGATIVE
Nitrite: NEGATIVE
PROTEIN: NEGATIVE mg/dL
Specific Gravity, Urine: 1.026 (ref 1.005–1.030)
pH: 5 (ref 5.0–8.0)

## 2015-07-01 LAB — TSH: TSH: 3.004 u[IU]/mL (ref 0.350–4.500)

## 2015-07-01 LAB — LACTATE DEHYDROGENASE, PLEURAL OR PERITONEAL FLUID: LD, Fluid: 41 U/L — ABNORMAL HIGH (ref 3–23)

## 2015-07-01 LAB — I-STAT CG4 LACTIC ACID, ED: Lactic Acid, Venous: 2.19 mmol/L (ref 0.5–2.0)

## 2015-07-01 LAB — ALBUMIN, FLUID (OTHER): Albumin, Fluid: 1.2 g/dL

## 2015-07-01 LAB — I-STAT TROPONIN, ED: Troponin i, poc: 0 ng/mL (ref 0.00–0.08)

## 2015-07-01 MED ORDER — SODIUM CHLORIDE 0.45 % IV SOLN
INTRAVENOUS | Status: DC
  Administered 2015-07-01: 23:00:00 via INTRAVENOUS

## 2015-07-01 MED ORDER — FERROUS SULFATE 325 (65 FE) MG PO TABS
325.0000 mg | ORAL_TABLET | Freq: Two times a day (BID) | ORAL | Status: DC
  Administered 2015-07-02 – 2015-07-07 (×11): 325 mg via ORAL
  Filled 2015-07-01 (×11): qty 1

## 2015-07-01 MED ORDER — ENSURE ENLIVE PO LIQD
1.0000 | Freq: Every day | ORAL | Status: DC
  Administered 2015-07-02: 237 mL via ORAL

## 2015-07-01 MED ORDER — IPRATROPIUM-ALBUTEROL 0.5-2.5 (3) MG/3ML IN SOLN
3.0000 mL | Freq: Once | RESPIRATORY_TRACT | Status: AC
  Administered 2015-07-01: 3 mL via RESPIRATORY_TRACT
  Filled 2015-07-01: qty 3

## 2015-07-01 MED ORDER — ALBUTEROL SULFATE (2.5 MG/3ML) 0.083% IN NEBU: 3.0000 mL | INHALATION_SOLUTION | Freq: Four times a day (QID) | RESPIRATORY_TRACT | Status: DC | PRN

## 2015-07-01 MED ORDER — VITAMIN D 1000 UNITS PO TABS
1000.0000 [IU] | ORAL_TABLET | Freq: Every day | ORAL | Status: DC
  Administered 2015-07-02 – 2015-07-07 (×6): 1000 [IU] via ORAL
  Filled 2015-07-01 (×7): qty 1

## 2015-07-01 MED ORDER — DEXTROSE 5 % IV SOLN
1.0000 g | Freq: Once | INTRAVENOUS | Status: AC
  Administered 2015-07-01: 1 g via INTRAVENOUS
  Filled 2015-07-01: qty 10

## 2015-07-01 MED ORDER — LIDOCAINE-EPINEPHRINE 1 %-1:100000 IJ SOLN
10.0000 mL | Freq: Once | INTRAMUSCULAR | Status: AC
  Administered 2015-07-01: 10 mL
  Filled 2015-07-01: qty 1

## 2015-07-01 MED ORDER — HEPARIN SODIUM (PORCINE) 5000 UNIT/ML IJ SOLN
5000.0000 [IU] | Freq: Three times a day (TID) | INTRAMUSCULAR | Status: DC
  Administered 2015-07-01 – 2015-07-07 (×16): 5000 [IU] via SUBCUTANEOUS
  Filled 2015-07-01 (×14): qty 1

## 2015-07-01 MED ORDER — MOMETASONE FURO-FORMOTEROL FUM 200-5 MCG/ACT IN AERO
2.0000 | INHALATION_SPRAY | Freq: Two times a day (BID) | RESPIRATORY_TRACT | Status: DC
Start: 2015-07-01 — End: 2015-07-07
  Administered 2015-07-02 – 2015-07-07 (×9): 2 via RESPIRATORY_TRACT
  Filled 2015-07-01 (×2): qty 8.8

## 2015-07-01 MED ORDER — SODIUM CHLORIDE 0.9 % IV BOLUS (SEPSIS)
500.0000 mL | Freq: Once | INTRAVENOUS | Status: AC
  Administered 2015-07-01: 500 mL via INTRAVENOUS

## 2015-07-01 MED ORDER — MONTELUKAST SODIUM 10 MG PO TABS
10.0000 mg | ORAL_TABLET | Freq: Every day | ORAL | Status: DC
  Administered 2015-07-01 – 2015-07-06 (×6): 10 mg via ORAL
  Filled 2015-07-01 (×6): qty 1

## 2015-07-01 MED ORDER — DEXTROSE 5 % IV SOLN
2.0000 g | INTRAVENOUS | Status: DC
  Administered 2015-07-02 – 2015-07-03 (×2): 2 g via INTRAVENOUS
  Filled 2015-07-01 (×2): qty 2

## 2015-07-01 MED ORDER — SODIUM CHLORIDE 0.9% FLUSH
3.0000 mL | Freq: Two times a day (BID) | INTRAVENOUS | Status: DC
  Administered 2015-07-01 – 2015-07-06 (×10): 3 mL via INTRAVENOUS

## 2015-07-01 NOTE — Progress Notes (Signed)
Pharmacy Antibiotic Note  Donald Moreno is a 75 y.o. male admitted on 07/01/2015 with SBP.  Pharmacy has been consulted for Ceftriaxone dosing. WBC WNL. Other labs reviewed. Peritoneal fluid has been collected for cultures.   Plan: -Ceftriaxone 2g IV q24h -F/U body fluid cultures   Weight: 155 lb 3.3 oz (70.4 kg)  Temp (24hrs), Avg:98.2 F (36.8 C), Min:97.8 F (36.6 C), Max:98.6 F (37 C)   Recent Labs Lab 07/01/15 0841 07/01/15 0924 07/01/15 1930  WBC 7.6  --   --   CREATININE 1.67*  --   --   LATICACIDVEN  --  2.19* 2.2*    Estimated Creatinine Clearance: 39.2 mL/min (by C-G formula based on Cr of 1.67).    No Known Allergies   Narda Bonds 07/01/2015 10:48 PM

## 2015-07-01 NOTE — H&P (Signed)
[facility] Admission History and Physical Service Pager: [phone]  [name] Medical record number: [id number] Date of birth: [birth year] Age: 75 y.o. Gender: male  Primary Care Provider: Luiz Blare, DO Consultants: None Code Status: DNR  Chief Complaint: Abdominal pain  Assessment and Plan: Donald Moreno is a 75 y.o. male presenting with chronic abdominal distention and new abdominal pain. PMH is significant for remote alcoholism, cirrhosis, asthma, OA, gout  Spontaneous bacterial peritonitis: Presumptive Dx based on tachycardia and new abdominal discomfort in presence of ascites. Not septic (qSOFA 1).  - Follow blood cultures (2/15 >> ) - Continue CTX for SBP (2/15 >> anticipated final dose of 5 day course 2/19) consider D/C abx pending ascitic fluid cell analysis. If worsening clinically, consider broadening to include pseudomonal coverage. - Defer IV albumin for now as albumin isn't severely low at 3.1.  - 1L NS bolus given in ED, will continue MIVF x 20 hrs - Trend lactate until cleared - Could consider SBP ppx after discharge given history of ascites and upper GI bleed - Pt of Dr. Erlinda Hong, [facility], who has been made aware of admission - though no formal consult at this time.   Sinus tachycardia: Confirmed by ECG s/p adenosine per ED MD report. New, not present at [place] 1/11. Presumably related to related volume depletion, infection, pain. Mild anemia possibly contributing. Asymptomatic. Doubt PE with good oxygenation and no chest pain or significant DVT RFs. No reqcent h/o alcohol use or signs of withdrawal so will not order CIWA. - Hold beta blocker while asymptomatic, could consider this, though propranolol was discontinued for a history of hypotension - Peritoneal fluid studies pending - UA without signs of infection and pt w/o sxs.  - Consider D/C MIVF if/when resolves - Check TSH with history of weight loss (none found in  EMR)  Asthma: Persistent, chronic, stable. Wheezing with good air exchange on auscultation, saturating 100% on room air without labored breathing all suggest no exacerbation. Albuterol possibly contributing to tachycardia.  - Continue formulary dulera for advair and albuterol as needed. Consider xopenex if requiring consistent albuterol.  - Pulse ox with vital signs; O2 by [place] prn saturation < 92% - Regular diet (do not wish to restrict fluid with infection)  Protein calorie malnutrition: Chronic, taking ensure at home. Cachectic on exam. Presumptive wasting secondary to chronic illness. Long time alcohol and smokeless tobacco use predispose to malignancy, though  - Reorder formulary equivalent to ensure - Nutrition consult - PT/OT -> Do not suspect he will require SNF  CKD: Presumably hepatorenal, Cr at historic baseline. No casts on UA. - Avoid nephrotoxins - I/O  DNR STATUS CONFIRMED AT ADMISSION  FEN/GI: MIVF, regular diet (do not wish to restrict fluid with infection) Prophylaxis: Subcutaneous heparin  Disposition: Admit to telemetry for HR monitoring and treatment of presumptive SBP.   History of Present Illness:  Donald Moreno is a 75 y.o. male presenting with abdominal distention and new abdominal pain.   He reports gradual onset of pain last night around 12:30am when going to sleep. Pain was general in his abdomen and affected whatever side he was sleeping on, including right abdomen, left abdomen and his back when supine. Non-radiating, moderate to severe, keeping him awake, not experienced before. Nothing taken for the pain, but he called his nephew to bring him in this morning to get it checked out. Notes no changes prior to last night. Receives therapeutic paracenteses every 1 - 2 months, last  2/6, uncomplicated. His legs are chronically intermittently swollen. Denies personal or family history of clots, recent surgery, Dx malignancy.  Review Of Systems: Per HPI with the  following additions: He denies fevers, chills, night sweats, recent weight loss (chronically very thin), chest pain, dyspnea, palpitations, HA, vision changes, rash, wounds, dysuria, changes in urinary habits, constipation, blood in stool, N/V/D. No congestion, rhinorrhea, cough, sore throat. + intermittent nose bleeds in the winter. + wheezing chronically. Otherwise the remainder of the systems were negative.  Patient Active Problem List   Diagnosis Date Noted  . Abdominal pain 07/01/2015  . CKD (chronic kidney disease) 05/27/2015  . Ascites 04/06/2015  . Hepatic cirrhosis ([place]) 03/31/2015  . Irregular heartbeat 03/31/2015  . History of GI bleed 03/04/2015  . At risk for decreased bone density 03/04/2015  . Weight loss, unintentional 03/04/2015  . Abdominal distention 11/19/2014  . Inguinal hernia 10/28/2014  . ASTHMA, PERSISTENT 01/29/2010  . NICOTINE ADDICTION 04/30/2007  . Gout 07/13/2006  . HYPERTENSION, BENIGN SYSTEMIC 07/13/2006  . Arthropathy 07/13/2006   Past Medical History: Past Medical History  Diagnosis Date  . Arthritis   . Gout   . Asthma   . Hypertension   . ETOH abuse    Past Surgical History: History reviewed. No pertinent past surgical history.  Social History: Social History  Substance Use Topics  . Smoking status: Never Smoker   . Smokeless tobacco: Current User  . Alcohol Use: 1.2 oz/week    2 Cans of beer per week   Additional social history: Lives in deceased sister's house, which he owns, and his nephew and 2 nieces live with him periodically. Never has smoked but continues to dip daily. Started drinking "everything," meaning liquor, wine and beer at a young age, transitioned to just beer about 20 years ago, and stopped drinking without outside intervention about 1 year ago because he was told it was hurting his liver. Denies illicit drugs.  Please also refer to relevant sections of EMR.  Family History: Family History  Problem Relation Age of  Onset  . Asthma Sister   . Heart disease Sister     multiple stents.  . Asthma Brother   . Other Mother     died when pt was only 46 - unknown cause.  . Other Father     deceased.   Allergies and Medications: No Known Allergies No current facility-administered medications on file prior to encounter.   Current Outpatient Prescriptions on File Prior to Encounter  Medication Sig Dispense Refill  . albuterol (PROAIR HFA) 108 (90 Base) MCG/ACT inhaler take 2 puffs every 4 hours if needed for wheezing or shortness of breath 8.5 g 5  . allopurinol (ZYLOPRIM) 300 MG tablet Take 1 tablet by mouth  daily 60 tablet 1  . Cholecalciferol (VITAMIN D3) 1000 UNITS CAPS Take 1 capsule (1,000 Units total) by mouth daily. 60 capsule 1  . Emollient (CERAVE) LOTN Apply to dry skin daily. 1 Bottle 2  . ferrous sulfate 325 (65 FE) MG tablet Take 1 tablet (325 mg total) by mouth 2 (two) times daily with a meal. (Patient taking differently: Take 325 mg by mouth daily with breakfast. ) 60 tablet 3  . Fluticasone-Salmeterol (ADVAIR DISKUS) 500-50 MCG/DOSE AEPB Inhale 1 puff two times  daily 120 each 3  . montelukast (SINGULAIR) 10 MG tablet Take 1 tablet (10 mg total) by mouth at bedtime. 90 tablet 6  . Nutritional Supplements (ENSURE COMPLETE SHAKE) LIQD Take 1 Container by mouth daily.  237 mL 11    Objective: BP 121/101 mmHg  Pulse 139  Temp(Src) 98.1 F (36.7 C) (Oral)  Resp 25  SpO2 98% Exam: General: Pleasant, frail elderly gentleman in no distress Eyes: Muddy sclerae, arcus senilis, PERRL ENTM: MMM, edentulous, no nasal flaring, nares wnl Neck: Supple, no thyromegaly Cardiovascular: Tachycardic, no murmur, 2+ DP and radial pulses, elevated JVP. Respiratory: Nonlabored, 100% at time of exam breathing room air. Scattered expiratory wheezing with good air movement, bibasilar crackles Abdomen: Distended, nontender, cannot examine organomegaly, +BS GU: Very large reducible right inguinal hernia Ext:  LE's with + trace pitting edema R > L nontender, negative homan's. No deformities, decreased muscle bulk Skin: Thin without rashes or bleeding. RLQ needle wound from paracentesis hemostatic. Varicosities across abdomen. No wounds on sacrum or feet. Neuro: Alert and oriented, conversant without aphasia, gait slow but balanced, no dysmetria, no asterixis. Psych: Euthymic mood, broad affect.  Labs and Imaging: CBC BMET   Recent Labs Lab 07/01/15 0841  WBC 7.6  HGB 11.4*  HCT 34.1*  PLT 181    Recent Labs Lab 07/01/15 0841  NA 140  K 5.5*  CL 109  CO2 22  BUN 25*  CREATININE 1.67*  GLUCOSE 115*  CALCIUM 9.2     ECG: Supraventricular tachycardia, 142bpm, normal ventricular axis, Qtc 459msec  CXR 2/15: Elevated hemidiaphragms suggesting abdominal ascites as seen previously. Mild basilar atelectasis and fluid in the fissures.  Lactate: 2.19 Troponin 0.00  Patrecia Pour, MD 07/01/2015, 12:22 PM PGY-3, [facility] pager: [phone], text pages welcome

## 2015-07-01 NOTE — ED Notes (Signed)
Pt here for severe abd pain and swelling sts due to liver issues. Pt tachy in triage around 140.

## 2015-07-01 NOTE — ED Notes (Signed)
Patient transported to X-ray 

## 2015-07-01 NOTE — ED Notes (Signed)
Report attempted, Nurse in another room, will call back.

## 2015-07-01 NOTE — ED Provider Notes (Signed)
CSN: [id number]     Arrival date & time 07/01/15  [id number] History   First MD Initiated Contact with Patient 07/01/15 [phone]     Chief Complaint  Patient presents with  . Abdominal Pain     (Consider location/radiation/quality/duration/timing/severity/associated sxs/prior Treatment) HPI Comments: 75 year old male with past medical history including liver cirrhosis who presents with abdominal pain. The patient states that he often has mild abdominal discomfort related to distention from ascites. He reports that recently his abdominal pain has been worsening and he could not sleep last night due to the severity of the pain. His pain is not usually severe like this. He denies any vomiting, diarrhea, fevers, blood in his stool, or chest pain. His last therapeutic paracentesis was on 2/6, after which he was evaluated here for tachycardia. He has not noticed any heart racing sensation or shortness of breath. He chronically has wheezing related to asthma but denies any significant change recently.  Patient is a 75 y.o. male presenting with abdominal pain. The history is provided by the patient.  Abdominal Pain   Past Medical History  Diagnosis Date  . Arthritis   . Gout   . Asthma   . Hypertension   . ETOH abuse    History reviewed. No pertinent past surgical history. Family History  Problem Relation Age of Onset  . Asthma Sister   . Heart disease Sister     multiple stents.  . Asthma Brother   . Other Mother     died when pt was only 83 - unknown cause.  . Other Father     deceased.   Social History  Substance Use Topics  . Smoking status: Never Smoker   . Smokeless tobacco: Current User  . Alcohol Use: 1.2 oz/week    2 Cans of beer per week    Review of Systems  Gastrointestinal: Positive for abdominal pain.    10 Systems reviewed and are negative for acute change except as noted in the HPI.   Allergies  Review of patient's allergies indicates no known allergies.  Home  Medications   Prior to Admission medications   Medication Sig Start Date End Date Taking? Authorizing Provider  albuterol (PROAIR HFA) 108 (90 Base) MCG/ACT inhaler take 2 puffs every 4 hours if needed for wheezing or shortness of breath 05/27/15   Katheren Shams, DO  allopurinol (ZYLOPRIM) 300 MG tablet Take 1 tablet by mouth  daily 02/09/15   Katheren Shams, DO  Cholecalciferol (VITAMIN D3) 1000 UNITS CAPS Take 1 capsule (1,000 Units total) by mouth daily. 03/05/15   Katheren Shams, DO  Emollient (CERAVE) LOTN Apply to dry skin daily. 03/04/15   Katheren Shams, DO  ferrous sulfate 325 (65 FE) MG tablet Take 1 tablet (325 mg total) by mouth 2 (two) times daily with a meal. Patient taking differently: Take 325 mg by mouth daily with breakfast.  10/21/14   Janora Norlander, DO  Fluticasone-Salmeterol (ADVAIR DISKUS) 500-50 MCG/DOSE AEPB Inhale 1 puff two times  daily 05/27/15   Katheren Shams, DO  montelukast (SINGULAIR) 10 MG tablet Take 1 tablet (10 mg total) by mouth at bedtime. 07/29/14   Katheren Shams, DO  Nutritional Supplements (ENSURE COMPLETE SHAKE) LIQD Take 1 Container by mouth daily. 03/04/15   Jazma Sharyne Richters, DO   BP 135/91 mmHg  Pulse 143  Temp(Src) 98.1 F (36.7 C) (Oral)  Resp 26  SpO2 100% Physical Exam  Constitutional: He is oriented to person,  place, and time. No distress.  Cachectic with significant ascites  HENT:  Head: Normocephalic and atraumatic.  Moist mucous membranes  Eyes: Conjunctivae are normal. Pupils are equal, round, and reactive to light.  Neck: Neck supple.  Cardiovascular: Regular rhythm and normal heart sounds.   No murmur heard. Tachycardic  Pulmonary/Chest: Effort normal.  Wheezes bilaterally with diminished breath sounds left lower lung  Abdominal: Soft. Bowel sounds are normal. He exhibits distension. There is no tenderness.  Significant distension w/ umbilical hernia  Musculoskeletal:  1+ pitting edema R>L leg  Neurological: He is alert and  oriented to person, place, and time.  Fluent speech  Skin: Skin is warm and dry. No rash noted.  Psychiatric: He has a normal mood and affect. Judgment normal.  Nursing note and vitals reviewed.   ED Course  .Paracentesis Date/Time: 07/01/2015 10:46 AM Performed by: Sharlett Iles Authorized by: Sharlett Iles Consent: Written consent obtained. Risks and benefits: risks, benefits and alternatives were discussed Consent given by: patient Patient understanding: patient states understanding of the procedure being performed Patient consent: the patient's understanding of the procedure matches consent given Procedure consent: procedure consent matches procedure scheduled Patient identity confirmed: verbally with patient Time out: Immediately prior to procedure a "time out" was called to verify the correct patient, procedure, equipment, support staff and site/side marked as required. Initial or subsequent exam: initial Procedure purpose: diagnostic Indications: secondary bacterial peritonitis and suspected peritonitis Anesthesia: local infiltration Local anesthetic: lidocaine 1% with epinephrine Anesthetic total: 0.2 ml Patient sedated: no Preparation: Patient was prepped and draped in the usual sterile fashion. Needle gauge: 18 Ultrasound guidance: yes Puncture site: left lower quadrant Fluid removed: 60(ml) Fluid appearance: cloudy Dressing: 4x4 sterile gauze Patient tolerance: Patient tolerated the procedure well with no immediate complications   (including critical care time) Labs Review Labs Reviewed  COMPREHENSIVE METABOLIC PANEL - Abnormal; Notable for the following:    Potassium 5.5 (*)    Glucose, Bld 115 (*)    BUN 25 (*)    Creatinine, Ser 1.67 (*)    Albumin 3.1 (*)    Alkaline Phosphatase 132 (*)    GFR calc non Af Amer 39 (*)    GFR calc Af Amer 45 (*)    All other components within normal limits  CBC - Abnormal; Notable for the following:    RBC  3.37 (*)    Hemoglobin 11.4 (*)    HCT 34.1 (*)    MCV 101.2 (*)    All other components within normal limits  URINALYSIS, ROUTINE W REFLEX MICROSCOPIC (NOT AT [facility]) - Abnormal; Notable for the following:    Color, Urine AMBER (*)    Bilirubin Urine SMALL (*)    All other components within normal limits  LACTATE DEHYDROGENASE, BODY FLUID - Abnormal; Notable for the following:    LD, Fluid 41 (*)    All other components within normal limits  I-STAT CG4 LACTIC ACID, ED - Abnormal; Notable for the following:    Lactic Acid, Venous 2.19 (*)    All other components within normal limits  URINE CULTURE  GRAM STAIN  CULTURE, BLOOD (ROUTINE X 2)  CULTURE, BLOOD (ROUTINE X 2)  CULTURE, BODY FLUID-BOTTLE  LIPASE, BLOOD  GLUCOSE, PERITONEAL FLUID  PROTEIN, BODY FLUID  ALBUMIN, FLUID  I-STAT TROPOININ, ED    Imaging Review Dg Chest 2 View  07/01/2015  CLINICAL DATA:  Tachycardia with abdominal pain and distension. EXAM: CHEST  2 VIEW COMPARISON:  01/24/2015 FINDINGS:  Heart and mediastinal shadows are normal. Diaphragms are elevated consistent with the presence of abdominal ascites. There is mild atelectasis at the lung bases. There is a small amount of fluid in the fissures. No acute bone finding. IMPRESSION: Elevated hemidiaphragms suggesting abdominal ascites as seen previously. Mild basilar atelectasis and fluid in the fissures. Electronically Signed   By: Nelson Chimes M.D.   On: 07/01/2015 10:09   I have personally reviewed and evaluated these lab results as part of my medical decision-making.   EKG Interpretation   Date/Time:  Wednesday July 01 2015 08:30:57 EST Ventricular Rate:  142 PR Interval:    QRS Duration: 94 QT Interval:  324 QTC Calculation: 498 R Axis:   42 Text Interpretation:  Supraventricular tachycardia Nonspecific ST  abnormality Abnormal ECG No significant change since last tracing  Confirmed by Brooke Payes MD, Nevae Pinnix [id number]) on 07/01/2015 9:09:19 AM      MDM    Final diagnoses:  Generalized abdominal pain  Ascites  Sinus tachycardia (HCC)   Pt w/ h/o cirrhosis p/w worsening abdominal pain and his usual abd distension related to ascites. On exam, he was thin and chronically ill-appearing but comfortable and in no acute distress. Heart rate in the 140s, mild wheezes on exam but normal work of breathing. No focal abdominal tenderness but significant abdominal distention noted. Obtained above labs as well as chest x-ray. EKG shows sinus tachycardia without ischemic changes. Also sent blood and urine cultures given the patient's chronic disease and risk of infection.  Labs show mildly elevated lactate at 2.2, hyperkalemia at 5.5, stable creatinine of 1.67, stable anemia with hemoglobin 11.4. Chest x-ray negative acute.  I'm concerned about SBP. Performed a diagnostic paracentesis at bedside after obtaining consent; see procedure note for details. Patient tolerated procedure well. Fluid sent for analysis and culture. Gave the patient a dose of ceftriaxone as UA and chest x-ray do not show signs of infection. I discussed admission with family medicine, Dr. Bonner Puna, and pt admitted for further care.  Sharlett Iles, MD 07/01/15 [phone]

## 2015-07-01 NOTE — ED Notes (Signed)
Ignore

## 2015-07-01 NOTE — Progress Notes (Signed)
Arrival Method: via stretcher Mental Status: alert and oriented x 4 Telemetry: applied, CCMD notified. Pt ST Tubes: n/a IV: RAC Pain: Pt states he is always in pain, but refuses intervention  Family: at bedside Living Situation: home with family  Safety Measures: bed in lowest position, call bell in reach,  6E Orientation: oriented to staff and unit

## 2015-07-01 NOTE — ED Notes (Signed)
Pt transported to imaging.

## 2015-07-02 ENCOUNTER — Inpatient Hospital Stay (HOSPITAL_COMMUNITY): Payer: Medicare Other

## 2015-07-02 DIAGNOSIS — K7460 Unspecified cirrhosis of liver: Secondary | ICD-10-CM

## 2015-07-02 DIAGNOSIS — E43 Unspecified severe protein-calorie malnutrition: Secondary | ICD-10-CM | POA: Insufficient documentation

## 2015-07-02 DIAGNOSIS — N182 Chronic kidney disease, stage 2 (mild): Secondary | ICD-10-CM

## 2015-07-02 LAB — URINE CULTURE

## 2015-07-02 LAB — CBC
HEMATOCRIT: 30.6 % — AB (ref 39.0–52.0)
HEMOGLOBIN: 10.2 g/dL — AB (ref 13.0–17.0)
MCH: 32.9 pg (ref 26.0–34.0)
MCHC: 33.3 g/dL (ref 30.0–36.0)
MCV: 98.7 fL (ref 78.0–100.0)
Platelets: 182 10*3/uL (ref 150–400)
RBC: 3.1 MIL/uL — AB (ref 4.22–5.81)
RDW: 14.1 % (ref 11.5–15.5)
WBC: 6.3 10*3/uL (ref 4.0–10.5)

## 2015-07-02 LAB — COMPREHENSIVE METABOLIC PANEL
ALBUMIN: 2.7 g/dL — AB (ref 3.5–5.0)
ALK PHOS: 101 U/L (ref 38–126)
ALT: 12 U/L — AB (ref 17–63)
AST: 25 U/L (ref 15–41)
Anion gap: 9 (ref 5–15)
BILIRUBIN TOTAL: 0.9 mg/dL (ref 0.3–1.2)
BUN: 25 mg/dL — AB (ref 6–20)
CALCIUM: 8.7 mg/dL — AB (ref 8.9–10.3)
CO2: 21 mmol/L — AB (ref 22–32)
CREATININE: 1.57 mg/dL — AB (ref 0.61–1.24)
Chloride: 108 mmol/L (ref 101–111)
GFR calc Af Amer: 49 mL/min — ABNORMAL LOW (ref >60)
GFR calc non Af Amer: 42 mL/min — ABNORMAL LOW (ref >60)
GLUCOSE: 89 mg/dL (ref 65–99)
Potassium: 5.1 mmol/L (ref 3.5–5.1)
SODIUM: 138 mmol/L (ref 135–145)
TOTAL PROTEIN: 6.2 g/dL — AB (ref 6.5–8.1)

## 2015-07-02 LAB — PHOSPHORUS: Phosphorus: 3.7 mg/dL (ref 2.5–4.6)

## 2015-07-02 LAB — PATHOLOGIST SMEAR REVIEW

## 2015-07-02 LAB — LACTIC ACID, PLASMA: Lactic Acid, Venous: 0.9 mmol/L (ref 0.5–2.0)

## 2015-07-02 LAB — MAGNESIUM: Magnesium: 2 mg/dL (ref 1.7–2.4)

## 2015-07-02 MED ORDER — ENSURE ENLIVE PO LIQD
1.0000 | Freq: Two times a day (BID) | ORAL | Status: DC
  Administered 2015-07-02 – 2015-07-07 (×8): 237 mL via ORAL

## 2015-07-02 MED ORDER — SPIRONOLACTONE 25 MG PO TABS
50.0000 mg | ORAL_TABLET | Freq: Every day | ORAL | Status: DC
  Administered 2015-07-02 – 2015-07-07 (×6): 50 mg via ORAL
  Filled 2015-07-02 (×3): qty 1
  Filled 2015-07-02: qty 2
  Filled 2015-07-02: qty 1
  Filled 2015-07-02: qty 2

## 2015-07-02 MED ORDER — METOPROLOL SUCCINATE ER 25 MG PO TB24
25.0000 mg | ORAL_TABLET | Freq: Every day | ORAL | Status: DC
  Administered 2015-07-02 – 2015-07-03 (×2): 25 mg via ORAL
  Filled 2015-07-02 (×2): qty 1

## 2015-07-02 MED ORDER — LIDOCAINE HCL (PF) 1 % IJ SOLN
INTRAMUSCULAR | Status: AC
  Filled 2015-07-02: qty 10

## 2015-07-02 MED ORDER — FUROSEMIDE 20 MG PO TABS
20.0000 mg | ORAL_TABLET | Freq: Every day | ORAL | Status: DC
  Administered 2015-07-02 – 2015-07-07 (×6): 20 mg via ORAL
  Filled 2015-07-02 (×7): qty 1

## 2015-07-02 NOTE — Progress Notes (Signed)
Results for Donald Moreno, Donald Moreno (MRN [id number]), notified as of 07/01/2015 21:14  Ref. Range 07/01/2015 19:30  Lactic Acid, Venous Latest Ref Range: 0.5-2.0 mmol/L 2.2 [facility])  MD on call notified 07/01/15 @ 22:12

## 2015-07-02 NOTE — Progress Notes (Signed)
Initial Nutrition Assessment  DOCUMENTATION CODES:   Severe malnutrition in context of chronic illness  INTERVENTION:  Provide Ensure Enlive po BID, each supplement provides 350 kcal and 20 grams of protein.  Encourage adequate PO intake.   NUTRITION DIAGNOSIS:   Malnutrition related to chronic illness as evidenced by severe depletion of body fat, severe depletion of muscle mass.  GOAL:   Patient will meet greater than or equal to 90% of their needs  MONITOR:   PO intake, Supplement acceptance, Weight trends, Labs, I & O's, Skin  REASON FOR ASSESSMENT:   Consult  (PCM)  ASSESSMENT:   75 y.o. male presenting with chronic abdominal distention and new abdominal pain. PMH is significant for remote alcoholism, cirrhosis, asthma, OA, gout Pt to undergo paracentesis today.   Pt reports appetite is fine currently and PTA. He reports only eating at most 1-2 meals a day with Ensure shakes 1-2 times daily. Usual body weight unknown as he reports weight fluctuates frequently. Per Epic weight records, pt with no significant weight loss. Current meal completion has been 90%. Pt currently has Ensure ordered and has been consuming them. RD to increase Ensure order to aid in caloric and protein needs. Pt was encouraged to eat his food at meals and to drink his supplements.   Nutrition-Focused physical exam completed. Findings are severe fat depletion, severe muscle depletion, and moderate edema.   Labs and medications reviewed.   Diet Order:  Diet regular Room service appropriate?: Yes; Fluid consistency:: Thin  Skin:  Reviewed, no issues  Last BM:  2/15  Height:   Ht Readings from Last 1 Encounters:  05/06/15 5\' 9"  (1.753 m)    Weight:   Wt Readings from Last 1 Encounters:  07/01/15 155 lb 3.3 oz (70.4 kg)    Ideal Body Weight:  72.7 kg  BMI:  Body mass index is 22.91 kg/(m^2).  Estimated Nutritional Needs:   Kcal:  1900-2100  Protein:  100-110 grams  Fluid:  Per  MD  EDUCATION NEEDS:   No education needs identified at this time  Corrin Parker, MS, RD, LDN Pager # [phone] After hours/ weekend pager # [phone]

## 2015-07-02 NOTE — Evaluation (Signed)
Occupational Therapy Evaluation Patient Details Name: Jaquante Kirschenmann MRN: [id number] DOB: 03/17/1941 Today's Date: 07/02/2015    History of Present Illness Antwoine Mcguffie is a 75 y.o. male presenting with chronic abdominal distention and new abdominal pain. PMH is significant for remote alcoholism, cirrhosis, asthma, OA, gout admitted  on 07/01/15 with diagnosis: Spontaneous bacterial peritonitis.   Clinical Impression   Pt admitted as above and is overall Mod I-distant supervision level for ADL's and self care tasks. He is Independent bed mobility, sit to stand transfers & overall Mod I-distant supervision for toilet transfers. Pt stood at sink for bathing UB and grooming tasks, and sat in chair to don/doff sock and for LB dressing with increased time for tasks. Pt was educated in Role of OT and states that he has PRN intermittent assistance from family members upon d/c. Pt currently polietly declines need for further acute OT at this time. Will sign off as pt appears at baseline level.   Follow Up Recommendations  No OT follow up;Supervision - Intermittent    Equipment Recommendations  None recommended by OT    Recommendations for Other Services       Precautions / Restrictions        Mobility Bed Mobility Overal bed mobility: Independent                Transfers Overall transfer level: Modified independent Equipment used: None                  Balance Overall balance assessment: No apparent balance deficits (not formally assessed)                                          ADL Overall ADL's : At baseline                                       General ADL Comments: Pt is overall Mod I-distant supervision level for ADL's and self care tasks. He was assessed by OT today was Independent bed mobility, transfers overall Mod I-distant supervision for toilet transfers. Pt stood at sink for bathing UB and grooming tasks, and sat in  chair to don/doff sock and for LB dressing with increased time for tasks. Pt was educated in Role of OT and states that he has PRN intermittent assistance from family members upon d/c. Pt currently polietly declines need for further acute OT at this time. Will sign off as pt appears at baseline level.     Vision  Wears glasses for distance: No change from baseline per pt report   Perception     Praxis      Pertinent Vitals/Pain Pain Assessment: 0-10 Pain Score: 3  Pain Location: Abdomen Pain Descriptors / Indicators: Tightness Pain Intervention(s): Limited activity within patient's tolerance;Repositioned;Monitored during session     Hand Dominance Right   Extremity/Trunk Assessment Upper Extremity Assessment Upper Extremity Assessment: Overall WFL for tasks assessed   Lower Extremity Assessment Lower Extremity Assessment: Defer to PT evaluation       Communication Communication Communication: No difficulties   Cognition Arousal/Alertness: Awake/alert Behavior During Therapy: WFL for tasks assessed/performed Overall Cognitive Status: Within Functional Limits for tasks assessed                     General Comments  Exercises       Shoulder Instructions      Home Living Family/patient expects to be discharged to:: Private residence Living Arrangements: Other relatives (Neice and nephew live with pt, both work and are avail intermittently) Available Help at Discharge: Family;Available PRN/intermittently Type of Home: House Home Access: Level entry     Home Layout: One level     Bathroom Shower/Tub: Tub/shower unit;Curtain   Biochemist, clinical: Standard     Home Equipment: Bedside commode;Shower seat          Prior Functioning/Environment Level of Independence: Independent with assistive device(s)        Comments: drives, I ADL's and functional mobiltiy w/o AD. Uses 3:1 over toilet and shower seat in tub     OT Diagnosis:     OT Problem  List:     OT Treatment/Interventions:      OT Goals(Current goals can be found in the care plan section) Acute Rehab OT Goals Patient Stated Goal: Get some fluid off  OT Goal Formulation: All assessment and education complete, DC therapy  OT Frequency:     Barriers to D/C:            Co-evaluation              End of Session    Activity Tolerance: Patient tolerated treatment well Patient left: in chair;with call bell/phone within reach;with chair alarm set   Time: [id number] OT Time Calculation (min): 34 min Charges:  OT General Charges $OT Visit: 1 Procedure OT Evaluation $OT Eval Moderate Complexity: 1 Procedure OT Treatments $Self Care/Home Management : 8-22 mins G-Codes:    Josephine Igo Dixon, OTR/L 07/02/2015, 10:28 AM

## 2015-07-02 NOTE — Progress Notes (Signed)
Family Medicine Teaching Service Daily Progress Note Intern Pager: [phone]  [name] Medical record number: [id number] Date of birth: Oct 19, 1940 Age: 75 y.o. Gender: male  Primary Care Provider: Luiz Blare, DO Consultants: Cardiology Code Status: DNR  Pt Overview and Major Events to Date:  12/15 - admitted to [facility] 12/16 - paracentesis  Assessment and Plan: [facility] is a 75 y.o. male presenting with chronic abdominal distention and new abdominal pain. PMH is significant for remote alcoholism, cirrhosis, asthma, OA, gout.  Abdominal pain: Initially thought to be SBP based on tachycardia and new abdominal discomfort in presence of ascites, however ascitic fluid analysis ruled out SBP (only 68 WBC and 2 neutrophils). Not septic (qSOFA 1). Lactic acid increased to 2.2 initially; repeat LA normalized to 0.9. Received paracentesis yesterday.  - Follow blood cultures - NGx1d - Follow ascitic fluid culture - NGx1d - Continue CTX for SBP (2/15 >) - Defer IV albumin for now as albumin isn't severely low at 2.7  - Could consider SBP ppx after discharge given history of ascites and upper GI bleed - Pt of Dr. Erlinda Hong, [facility], who has been made aware of admission - though no formal consult at this time. - Cont Lasix 20 mg, spironolactone 50 mg   Sinus tachycardia: Confirmed by ECG s/p adenosine per ED MD report. New, not present at [place] 1/11. Possibly related to related volume depletion, infection, pain. Mild anemia possibly contributing. Asymptomatic. Doubt PE with good oxygenation and no chest pain or significant DVT RFs. No reqcent h/o alcohol use or signs of withdrawal so will not order CIWA. UA without signs of infection and pt w/o sxs; cx with multiple species suggesting not clean catch. TSH WNL. Tachycardia persists; average HR in 140s again overnight.  - Cont metoprolol 25 mg - Cards consulted - appreciate recs  Asthma: Persistent, chronic, stable. Wheezing with  good air exchange on auscultation, saturating 100% on room air without labored breathing all suggest no exacerbation. Albuterol possibly contributing to tachycardia. Maintaining adequate O2 sats on RA.  - Continue formulary dulera for advair and albuterol as needed.  - Pulse ox with vital signs; O2 by [place] prn saturation < 92% - Regular diet (do not wish to restrict fluid with infection)  Protein calorie malnutrition: Chronic, taking ensure at home. Cachectic on exam. Presumptive wasting secondary to chronic illness. Long time alcohol and smokeless tobacco use predispose to malignancy. Also seen by OT, who recommended no follow-up. Pt not evaluated by PT yesterday due to tachycardia at rest.  - Reorder formulary equivalent to ensure - Nutrition consult - continue Ensure Enlive PO BID (350kcal and 20g protein in each supplement) - F/u PT recs  CKD: Presumably hepatorenal, Cr at historic baseline. No casts on UA. - Avoid nephrotoxins - I/O  FEN/GI: regular diet (do not wish to restrict fluid with infection) Prophylaxis: subQ heparin  Disposition: home pending medical improvement  Subjective:  Patient says he is feeling much better this AM after paracentesis. Remains tachycardic, but has no complaints.   Objective: Temp:  [97.9 F (36.6 C)-99.2 F (37.3 C)] 97.9 F (36.6 C) (02/17 0835) Pulse Rate:  [128-144] 128 (02/17 0835) Resp:  [18-19] 18 (02/17 0835) BP: (96-139)/(65-92) 96/73 mmHg (02/17 0835) SpO2:  [98 %-100 %] 98 % (02/17 0835) Weight:  [139 lb (63.05 kg)] 139 lb (63.05 kg) (02/16 2115) Physical Exam: General: sitting on side of bed eating breakfast, in NAD Cardiovascular: tachycardic, regular rhythm, no murmurs appreciated Respiratory: scattered wheezes bilaterally,  good air movement, normal work of breathing on RA Abdomen: distended but improved from yesterday; non-tender, +BS Extremities: 1+ pitting edema bilateral LE to midshin  Laboratory:  Recent Labs Lab  07/01/15 0841 07/02/15 0604  WBC 7.6 6.3  HGB 11.4* 10.2*  HCT 34.1* 30.6*  PLT 181 182    Recent Labs Lab 07/01/15 0841 07/02/15 0604  NA 140 138  K 5.5* 5.1  CL 109 108  CO2 22 21*  BUN 25* 25*  CREATININE 1.67* 1.57*  CALCIUM 9.2 8.7*  PROT 7.1 6.2*  BILITOT 0.7 0.9  ALKPHOS 132* 101  ALT 17 12*  AST 30 25  GLUCOSE 115* 89   Imaging/Diagnostic Tests: Dg Chest 2 View 07/01/2015 IMPRESSION: Elevated hemidiaphragms suggesting abdominal ascites as seen previously. Mild basilar atelectasis and fluid in the fissures.    Verner Mould, MD 07/03/2015, 9:26 AM PGY-1, [place] Intern pager: [phone], text pages welcome

## 2015-07-02 NOTE — Progress Notes (Signed)
Family Medicine Teaching Service Daily Progress Note Intern Pager: [phone]  [name] Medical record number: [id number] Date of birth: Oct 21, 1940 Age: 75 y.o. Gender: male  Primary Care Provider: Luiz Blare, DO Consultants: None Code Status: DNR  Pt Overview and Major Events to Date:  12/15 - admitted to [facility]  Assessment and Plan: [facility] is a 75 y.o. male presenting with chronic abdominal distention and new abdominal pain. PMH is significant for remote alcoholism, cirrhosis, asthma, OA, gout  Abdominal pain: Initially thought to be SBP based on tachycardia and new abdominal discomfort in presence of ascites, however ascitic fluid analysis ruled out SBP (only 68 WBC and 2 neutrophils). Not septic (qSOFA 1). Lactic acid increased to 2.2 initially; repeat LA normalized to 0.9.  - Follow blood cultures - NGTD - Continue CTX for SBP (2/15 >) - Defer IV albumin for now as albumin isn't severely low at 2.7  - Could consider SBP ppx after discharge given history of ascites and upper GI bleed - Pt of Dr. Erlinda Hong, [facility], who has been made aware of admission - though no formal consult at this time. - Begin Lasix 20 mg, spironolactone 50 mg  - Paracentesis today  Sinus tachycardia: Confirmed by ECG s/p adenosine per ED MD report. New, not present at [place] 1/11. Possibly related to related volume depletion, infection, pain. Mild anemia possibly contributing. Asymptomatic. Doubt PE with good oxygenation and no chest pain or significant DVT RFs. No reqcent h/o alcohol use or signs of withdrawal so will not order CIWA. UA without signs of infection and pt w/o sxs. TSH WNL. Tachycardia persists this AM; average HR in 140s overnight. Volume depletion does not seem to be etiology, as has not improved with overnight fluid administration.  - Begin metoprolol 25 mg  Asthma: Persistent, chronic, stable. Wheezing with good air exchange on auscultation, saturating 100% on room air  without labored breathing all suggest no exacerbation. Albuterol possibly contributing to tachycardia.  - Continue formulary dulera for advair and albuterol as needed. Consider xopenex if requiring consistent albuterol.  - Pulse ox with vital signs; O2 by [place] prn saturation < 92% - Regular diet (do not wish to restrict fluid with infection)  Protein calorie malnutrition: Chronic, taking ensure at home. Cachectic on exam. Presumptive wasting secondary to chronic illness. Long time alcohol and smokeless tobacco use predispose to malignancy, though  - Reorder formulary equivalent to ensure - Nutrition consult - PT/OT - do not suspect he will require SNF  CKD: Presumably hepatorenal, Cr at historic baseline. No casts on UA. - Avoid nephrotoxins - I/O  FEN/GI: regular diet (do not wish to restrict fluid with infection) Prophylaxis: subQ heparin  Disposition: home pending medical improvement  Subjective:  Patient reports continued abdominal pain. He says when this has happened in the past, his pain and tachycardia have subsided after paracentesis. He denies palpitations or chest pain.   Objective: Temp:  [97.8 F (36.6 C)-98.6 F (37 C)] 98.3 F (36.8 C) (02/16 0900) Pulse Rate:  [135-148] 147 (02/16 0900) Resp:  [16-29] 18 (02/16 0900) BP: (115-139)/(81-107) 139/96 mmHg (02/16 0900) SpO2:  [95 %-100 %] 98 % (02/16 0900) Weight:  [155 lb 3.3 oz (70.4 kg)-157 lb 3 oz (71.3 kg)] 155 lb 3.3 oz (70.4 kg) (02/15 2119) Physical Exam: General: resting comfortably in bed eating breakfast, in NAD Cardiovascular: tachycardic, regular rhythm, no murmurs appreciated Respiratory: scattered wheezes bilaterally, good air movement, normal work of breathing on RA Abdomen: very distended, tender  Extremities: 1+ pitting edema bilateral LE to midshin  Laboratory:  Recent Labs Lab 07/01/15 0841 07/02/15 0604  WBC 7.6 6.3  HGB 11.4* 10.2*  HCT 34.1* 30.6*  PLT 181 182    Recent Labs Lab  07/01/15 0841 07/02/15 0604  NA 140 138  K 5.5* 5.1  CL 109 108  CO2 22 21*  BUN 25* 25*  CREATININE 1.67* 1.57*  CALCIUM 9.2 8.7*  PROT 7.1 6.2*  BILITOT 0.7 0.9  ALKPHOS 132* 101  ALT 17 12*  AST 30 25  GLUCOSE 115* 89   Imaging/Diagnostic Tests: Dg Chest 2 View 07/01/2015 IMPRESSION: Elevated hemidiaphragms suggesting abdominal ascites as seen previously. Mild basilar atelectasis and fluid in the fissures.    Verner Mould, MD 07/02/2015, 12:07 PM PGY-1, [place] Intern pager: [phone], text pages welcome

## 2015-07-02 NOTE — Discharge Summary (Signed)
[facility] Discharge Summary  [name] Medical record number: [id number] Date of birth: 06/13/1940 Age: 75 y.o. Gender: male Date of Admission: 07/01/2015  Date of Discharge: 07/07/2015 Admitting Physician: Zenia Resides, MD  Primary Care Provider: Luiz Blare, DO Consultants: Cardiology  Indication for Hospitalization: abdominal pain, abdominal distention  Discharge Diagnoses/Problem List:  Patient Active Problem List   Diagnosis Date Noted  . Alcoholic cirrhosis of liver with ascites ([place])   . Typical atrial flutter ([place])   . Protein-calorie malnutrition, severe 07/02/2015  . Abdominal pain 07/01/2015  . Spontaneous bacterial peritonitis ([place]) 07/01/2015  . Sinus tachycardia ([place])   . CKD (chronic kidney disease) 05/27/2015  . Ascites 04/06/2015  . Hepatic cirrhosis ([place]) 03/31/2015  . Irregular heartbeat 03/31/2015  . History of GI bleed 03/04/2015  . At risk for decreased bone density 03/04/2015  . Weight loss, unintentional 03/04/2015  . Abdominal distention 11/19/2014  . Inguinal hernia 10/28/2014  . Asthma 01/29/2010  . NICOTINE ADDICTION 04/30/2007  . Gout 07/13/2006  . HYPERTENSION, BENIGN SYSTEMIC 07/13/2006  . Arthropathy 07/13/2006   Disposition: home  Discharge Condition: improved  Discharge Exam:  General: sitting on side of bed eating breakfast, in NAD Cardiovascular: RRR, no murmurs appreciated Respiratory: scattered wheezes bilaterally, good air movement, normal work of breathing on RA Abdomen: distended; non-tender, +BS Extremities: 1+ pitting edema bilaterally Neuro: A&Ox3, no focal deficits   Brief Hospital Course:  Patient was admitted for abdominal pain and distention.   Abdominal pain Patient presented after gradual onset of abdominal pain over the past 1 day. He has a history of cirrhosis and receives therapeutic paracentesis every 1-2 months, with his last paracentesis 9 days prior to  admission. Given increasing pain and distention so soon after paracentesis, as well as tachycardia noted in ED, patient was admitted for concern for SBP.  Analysis of ascitic fluid was not consistent with SBP (only 68 WBC and 2% neutrophils), however patient did receive 3 days of CTX. Culture of ascitic fluid with no growth.  Patient received paracentesis, and ~10L of fluid was removed. He was also started on Lasix and spironolactone. He subsequently began to improve after paracentesis and initiation of diuretics.   Atrial flutter Patient was noted to be tachycardic in ED, with HR averaged in the 140s. At first it was thought that this may be due to infection vs volume depletion, however SBP was ruled out by fluid analysis, and HR remained elevated after a night of IV fluid adminstration. He was started on metoprolol 25 mg, and received paracentesis, however he continued to have elevated HR. Patient was then noted to have atrial flutter on monitoring. Cardiology was consulted, who recommended amiodarone and increase in metoprolol. Due to patient's history of liver damage, digoxin was started in place of amiodarone. Patient's HR remained elevated despite digoxin, so he was transitioned to IV amiodarone. His HR quickly improved, and he was transitioned to PO amiodarone prior to discharge.    Issues for Follow Up:  1. Patient was started on Lasix 20mg  qd and spironolactone 50mg  qd for prevention of ascites.  2. Patient started on metoprolol 25 mg QID to decrease HR.  3. Patient started on amiodarone 200 mg qd for rate control. Per cardiology, can consider decreasing dose to 100 mg qd if rate remains controlled.  4. Due to history of GI bleeds and cirrhosis (Child Pugh Class B for cirrhosis mortality), SBP prophylaxis is indicated. Recommend beginning prophylactic antibiotics at follow-up appt.  5. As patient is now on amiodarone, recommend monitoring LFTs, lung function (obtaining baseline spirometry), and  thyroid panel.   Significant Procedures: US-guided paracentesis (07/02/2015)  Significant Labs and Imaging:   Recent Labs Lab 07/01/15 0841 07/02/15 0604 07/05/15 0525  WBC 7.6 6.3 6.6  HGB 11.4* 10.2* 10.2*  HCT 34.1* 30.6* 30.9*  PLT 181 182 163    Recent Labs Lab 07/01/15 0841 07/02/15 0604 07/05/15 0525 07/05/15 1942 07/06/15 0551  NA 140 138 137 132* 137  K 5.5* 5.1 5.4* 4.8 4.9  CL 109 108 110 100* 107  CO2 22 21* 21* 21* 23  GLUCOSE 115* 89 105* 112* 114*  BUN 25* 25* 41* 44* 45*  CREATININE 1.67* 1.57* 1.66* 1.79* 1.68*  CALCIUM 9.2 8.7* 8.6* 8.4* 8.8*  MG  --  2.0  --   --   --   PHOS  --  3.7  --   --   --   ALKPHOS 132* 101 108  --   --   AST 30 25 37  --   --   ALT 17 12* 19  --   --   ALBUMIN 3.1* 2.7* 2.1*  --   --     Results/Tests Pending at Time of Discharge: none  Discharge Medications:    Medication List    TAKE these medications        albuterol 108 (90 Base) MCG/ACT inhaler  Commonly known as:  PROAIR HFA  take 2 puffs every 4 hours if needed for wheezing or shortness of breath     allopurinol 300 MG tablet  Commonly known as:  ZYLOPRIM  Take 1 tablet by mouth  daily     amiodarone 200 MG tablet  Commonly known as:  PACERONE  Take 1 tablet (200 mg total) by mouth daily.     CERAVE Lotn  Apply to dry skin daily.     ENSURE COMPLETE SHAKE Liqd  Take 1 Container by mouth daily.     ferrous sulfate 325 (65 FE) MG tablet  Take 1 tablet (325 mg total) by mouth 2 (two) times daily with a meal.     Fluticasone-Salmeterol 500-50 MCG/DOSE Aepb  Commonly known as:  ADVAIR DISKUS  Inhale 1 puff two times  daily     furosemide 20 MG tablet  Commonly known as:  LASIX  Take 1 tablet (20 mg total) by mouth daily.     montelukast 10 MG tablet  Commonly known as:  SINGULAIR  Take 1 tablet (10 mg total) by mouth at bedtime.     spironolactone 50 MG tablet  Commonly known as:  ALDACTONE  Take 1 tablet (50 mg total) by mouth daily.      Vitamin D3 1000 units Caps  Take 1 capsule (1,000 Units total) by mouth daily.        Discharge Instructions: Please refer to Patient Instructions section of EMR for full details.  Patient was counseled important signs and symptoms that should prompt return to medical care, changes in medications, dietary instructions, activity restrictions, and follow up appointments.   Follow-Up Appointments: Follow-up Information    Follow up with Luiz Blare, DO. Schedule an appointment as soon as possible for a visit in 1 week.   Specialty:  Family Medicine   Why:  For hospital follow-up   Contact information:   [id number] [place] [address] [phone]       Follow up with Dola Argyle, MD. Schedule an appointment as  soon as possible for a visit in 1 week.   Specialty:  Cardiology   Why:  For hospital follow-up   Contact information:   [address] [place] [address] [phone]       Follow up with Landry Dyke, MD. Schedule an appointment as soon as possible for a visit in 1 week.   Specialty:  Gastroenterology   Why:  For hospital follow-up   Contact information:   [address] [place] [address] [phone]       Verner Mould, MD 07/07/2015, 11:08 AM PGY-1, [place]

## 2015-07-02 NOTE — Progress Notes (Signed)
HR 130's 140's sustained. PT holding off on evaluation today d/t HR.   MD notified.  Will continue to monitor.

## 2015-07-02 NOTE — Progress Notes (Addendum)
PT Cancellation Note  Patient Details Name: [facility] MRN: [id number] DOB: 14-Jul-1940   Cancelled Treatment:    Reason Eval/Treat Not Completed: Patient at procedure or test/unavailable Patient off unit for ultrasound. Will follow up for evaluation.  Ellouise Newer 07/02/2015, 1:56 PM  [place], [place]   Addendum: Patient back from ultrasound however, his HR is 139 at rest. Will hold comprehensive PT evaluation at this time.

## 2015-07-03 ENCOUNTER — Encounter (HOSPITAL_COMMUNITY): Payer: Self-pay | Admitting: Cardiology

## 2015-07-03 DIAGNOSIS — N183 Chronic kidney disease, stage 3 (moderate): Secondary | ICD-10-CM

## 2015-07-03 DIAGNOSIS — K7031 Alcoholic cirrhosis of liver with ascites: Principal | ICD-10-CM

## 2015-07-03 DIAGNOSIS — K652 Spontaneous bacterial peritonitis: Secondary | ICD-10-CM

## 2015-07-03 DIAGNOSIS — E43 Unspecified severe protein-calorie malnutrition: Secondary | ICD-10-CM

## 2015-07-03 DIAGNOSIS — I483 Typical atrial flutter: Secondary | ICD-10-CM | POA: Insufficient documentation

## 2015-07-03 MED ORDER — DIGOXIN 0.25 MG/ML IJ SOLN
0.2500 mg | INTRAMUSCULAR | Status: AC
  Administered 2015-07-03 – 2015-07-04 (×3): 0.25 mg via INTRAVENOUS
  Filled 2015-07-03 (×3): qty 1

## 2015-07-03 MED ORDER — AMIODARONE HCL IN DEXTROSE 360-4.14 MG/200ML-% IV SOLN
30.0000 mg/h | INTRAVENOUS | Status: DC
  Filled 2015-07-03: qty 200

## 2015-07-03 MED ORDER — DIGOXIN 125 MCG PO TABS
0.1250 mg | ORAL_TABLET | Freq: Every day | ORAL | Status: DC
  Administered 2015-07-04: 0.125 mg via ORAL
  Filled 2015-07-03: qty 1

## 2015-07-03 NOTE — Progress Notes (Signed)
Patient is a high fall risk. Patient refused to have bed alarm. Non-skid socks on. Patient educated on fall risks. Patient verbalizes understanding. RN will continue to monitor patient.  Ermalinda Memos, RN

## 2015-07-03 NOTE — Progress Notes (Signed)
EKG this morning showed SVT with PVCs. Patient is asymptomatic. MD notified, morning RN updated as well.

## 2015-07-03 NOTE — Progress Notes (Signed)
PT Cancellation Note  Patient Details Name: [facility] MRN: [id number] DOB: 1941/02/24   Cancelled Treatment:    Reason Eval/Treat Not Completed: Medical issues which prohibited therapy HR in 140s. Noted cards consultation. Will hold formal PT evaluation at this time and follow up tomorrow.  Ellouise Newer 07/03/2015, 2:37 PM  Camille Bal [place], [place]

## 2015-07-03 NOTE — Care Management Important Message (Signed)
Important Message  Patient Details  Name: [facility] MRN: [id number] Date of Birth: [birth year]   Medicare Important Message Given:  Yes    [name] P Talbert Trembath 07/03/2015, 11:54 AM

## 2015-07-03 NOTE — Consult Note (Signed)
Reason for Consult:  Possible SVT    Referring Physician: Dr. Avon Gully    PCP:  Luiz Blare, DO  Primary Cardiologist:Donald  Lleyton Moreno is an 75 y.o. male.    Chief Complaint: pt admitted 07/01/15  With abd pain with ascites.  Found to have tachycardia around 140.    HPI: 75 year old male with hx of liver cirrhosis and ascites with paracentesis 06/22/15  And seen for possible tachycardia- at that time he was given 6 mg of adenosine followed by 12 mg of adenosine.  .  On the 15th he had increased abd pain and came to ER on the 15th.  ER did paracentesis. With elevated HR ER MD gave adenosine 6 mg followed by 12 mg and with pause there were no underlying flutter wave.  With IV fluids HR decreased to 116.  It was felt to be S. Tach.  With admit on the 15th he was tachycardic with HR 144 he also showed signs of sepsis. He also has albuterol for asthma.   Here also with presumed ST.   He is now on toprol XL 25 mg daily.   BP borderline.  HR continues to be elevated.   No Echo in chart.  Currently not aware of tachycardia, has no complaints now since his abd. Was tapped.  No SOB and no Chest pain.   Past Medical History  Diagnosis Date  . Arthritis   . Gout   . Asthma   . Hypertension   . ETOH abuse     History reviewed. No pertinent past surgical history.  Family History  Problem Relation Age of Onset  . Asthma Sister   . Heart disease Sister     multiple stents.  . Asthma Brother   . Other Mother     died when pt was only 51 - unknown cause.  . Other Father     deceased.   Social History:  reports that he has never smoked. He uses smokeless tobacco. He reports that he drinks about 1.2 oz of alcohol per week. He reports that he does not use illicit drugs.  Allergies: No Known Allergies  OUTPATIENT MEDICATIONS: No current facility-administered medications on file prior to encounter.   Current Outpatient Prescriptions on File Prior to Encounter  Medication  Sig Dispense Refill  . albuterol (PROAIR HFA) 108 (90 Base) MCG/ACT inhaler take 2 puffs every 4 hours if needed for wheezing or shortness of breath 8.5 g 5  . allopurinol (ZYLOPRIM) 300 MG tablet Take 1 tablet by mouth  daily 60 tablet 1  . Cholecalciferol (VITAMIN D3) 1000 UNITS CAPS Take 1 capsule (1,000 Units total) by mouth daily. 60 capsule 1  . Emollient (CERAVE) LOTN Apply to dry skin daily. 1 Bottle 2  . ferrous sulfate 325 (65 FE) MG tablet Take 1 tablet (325 mg total) by mouth 2 (two) times daily with a meal. (Patient taking differently: Take 325 mg by mouth daily with breakfast. ) 60 tablet 3  . Fluticasone-Salmeterol (ADVAIR DISKUS) 500-50 MCG/DOSE AEPB Inhale 1 puff two times  daily 120 each 3  . montelukast (SINGULAIR) 10 MG tablet Take 1 tablet (10 mg total) by mouth at bedtime. 90 tablet 6  . Nutritional Supplements (ENSURE COMPLETE SHAKE) LIQD Take 1 Container by mouth daily. 237 mL 11   CURRENT MEDICATIONS: Scheduled Meds: . cholecalciferol  1,000 Units Oral Daily  . feeding supplement (ENSURE ENLIVE)  1 Bottle Oral BID BM  .  ferrous sulfate  325 mg Oral BID WC  . furosemide  20 mg Oral Daily  . heparin  5,000 Units Subcutaneous 3 times per day  . metoprolol succinate  25 mg Oral Daily  . mometasone-formoterol  2 puff Inhalation BID  . montelukast  10 mg Oral QHS  . sodium chloride flush  3 mL Intravenous Q12H  . spironolactone  50 mg Oral Daily   Continuous Infusions:  PRN Meds:.albuterol   Results for orders placed or performed during the hospital encounter of 07/01/15 (from the past 48 hour(s))  Pathologist smear review     Status: None   Collection Time: 07/01/15  5:00 PM  Result Value Ref Range   Path Review No malignant cells identified.     Comment: Reviewed by Audree Camel. Hillard, MD 07/02/2015   TSH     Status: None   Collection Time: 07/01/15  7:30 PM  Result Value Ref Range   TSH 3.004 0.350 - 4.500 uIU/mL  Lactic acid, plasma     Status: Abnormal    Collection Time: 07/01/15  7:30 PM  Result Value Ref Range   Lactic Acid, Venous 2.2 (HH) 0.5 - 2.0 mmol/L    Comment: CRITICAL RESULT CALLED TO, READ BACK BY AND VERIFIED WITH: [facility] M,RN 07/01/15 2112 WAYK   Body fluid cell count with differential     Status: Abnormal   Collection Time: 07/01/15  7:47 PM  Result Value Ref Range   Fluid Type-FCT FLUID     Comment: ASCITES CORRECTED ON 02/15 AT 1948: PREVIOUSLY REPORTED AS Body Fluid    Color, Fluid YELLOW (A) YELLOW   Appearance, Fluid CLOUDY (A) CLEAR   WBC, Fluid 68 0 - 1000 cu mm   Neutrophil Count, Fluid 2 0 - 25 %   Lymphs, Fluid 68 %   Monocyte-Macrophage-Serous Fluid 30 (L) 50 - 90 %   Eos, Fluid 0 %  Lactic acid, plasma     Status: None   Collection Time: 07/01/15 10:00 PM  Result Value Ref Range   Lactic Acid, Venous 1.4 0.5 - 2.0 mmol/L  Lactic acid, plasma     Status: None   Collection Time: 07/02/15  1:05 AM  Result Value Ref Range   Lactic Acid, Venous 0.9 0.5 - 2.0 mmol/L  CBC     Status: Abnormal   Collection Time: 07/02/15  6:04 AM  Result Value Ref Range   WBC 6.3 4.0 - 10.5 K/uL   RBC 3.10 (L) 4.22 - 5.81 MIL/uL   Hemoglobin 10.2 (L) 13.0 - 17.0 g/dL   HCT 30.6 (L) 39.0 - 52.0 %   MCV 98.7 78.0 - 100.0 fL   MCH 32.9 26.0 - 34.0 pg   MCHC 33.3 30.0 - 36.0 g/dL   RDW 14.1 11.5 - 15.5 %   Platelets 182 150 - 400 K/uL  Comprehensive metabolic panel     Status: Abnormal   Collection Time: 07/02/15  6:04 AM  Result Value Ref Range   Sodium 138 135 - 145 mmol/L   Potassium 5.1 3.5 - 5.1 mmol/L   Chloride 108 101 - 111 mmol/L   CO2 21 (L) 22 - 32 mmol/L   Glucose, Bld 89 65 - 99 mg/dL   BUN 25 (H) 6 - 20 mg/dL   Creatinine, Ser 1.57 (H) 0.61 - 1.24 mg/dL   Calcium 8.7 (L) 8.9 - 10.3 mg/dL   Total Protein 6.2 (L) 6.5 - 8.1 g/dL   Albumin 2.7 (L) 3.5 - 5.0  g/dL   AST 25 15 - 41 U/L   ALT 12 (L) 17 - 63 U/L   Alkaline Phosphatase 101 38 - 126 U/L   Total Bilirubin 0.9 0.3 - 1.2 mg/dL   GFR calc non Af  Amer 42 (L) >60 mL/min   GFR calc Af Amer 49 (L) >60 mL/min    Comment: (NOTE) The eGFR has been calculated using the CKD EPI equation. This calculation has not been validated in all clinical situations. eGFR's persistently <60 mL/min signify possible Chronic Kidney Disease.    Anion gap 9 5 - 15  Magnesium     Status: None   Collection Time: 07/02/15  6:04 AM  Result Value Ref Range   Magnesium 2.0 1.7 - 2.4 mg/dL  Phosphorus     Status: None   Collection Time: 07/02/15  6:04 AM  Result Value Ref Range   Phosphorus 3.7 2.5 - 4.6 mg/dL   US Paracentesis  07/02/2015  INDICATION: Abdominal distention secondary to recurrent ascites. Request therapeutic paracentesis. EXAM: ULTRASOUND GUIDED RIGHT LOWER QUADRANT PARACENTESIS MEDICATIONS: None. COMPLICATIONS: None immediate. PROCEDURE: Informed written consent was obtained from the patient after a discussion of the risks, benefits and alternatives to treatment. A timeout was performed prior to the initiation of the procedure. Initial ultrasound scanning demonstrates a large amount of ascites within the right lower abdominal quadrant. The right lower abdomen was prepped and draped in the usual sterile fashion. 1% lidocaine with epinephrine was used for local anesthesia. Following this, a Safe-T-Centesis catheter was introduced. An ultrasound image was saved for documentation purposes. The paracentesis was performed. The catheter was removed and a dressing was applied. The patient tolerated the procedure well without immediate post procedural complication. FINDINGS: A total of approximately 10.8 L of clear yellow fluid was removed. IMPRESSION: Successful ultrasound-guided paracentesis yielding 10.8 liters of peritoneal fluid. Read by: Ascencion Dike PA-C Electronically Signed   By: Markus Daft M.D.   On: 07/02/2015 14:23    ROS: General:no colds or fevers, + loss from 1/11/7 to 07/02/15 14 lbs Skin:no rashes or ulcers HEENT:no blurred vision, no  congestion CV:see HPI PUL:see HPI GI:no diarrhea constipation or melena, no indigestion GU:no hematuria, no dysuria MS:no joint pain, no claudication, + hx gout Neuro:no syncope, no lightheadedness Endo:no diabetes, no thyroid disease   Blood pressure 100/60, pulse 145, temperature 97.9 F (36.6 C), temperature source Oral, resp. rate 18, height '5\' 11"'  (1.803 m), weight 139 lb (63.05 kg), SpO2 98 %.  Wt Readings from Last 3 Encounters:  07/02/15 139 lb (63.05 kg)  05/27/15 152 lb 3.2 oz (69.037 kg)  05/06/15 141 lb (63.957 kg)    PE: General:Pleasant affect, NAD Skin:Warm and dry, brisk capillary refill HEENT:normocephalic, sclera clear, mucus membranes moist Neck:supple, no JVD, no bruits  Heart:S1S2 RRR rapid without murmur, gallup, rub or click Lungs:clear without rales, rhonchi, or wheezes IFO:YDXA, non tender, + BS, do not palpate liver spleen or masses Ext:1+ lower ext edema, 2+ pedal pulses, 2+ radial pulses Neuro:alert and oriented, MAE, follows commands, + facial symmetry   Tele:  Either ST or SVT rate continues at 144.  Assessment/Plan Principal Problem:   Spontaneous bacterial peritonitis (HCC) Active Problems:   HYPERTENSION, BENIGN SYSTEMIC   Asthma   Abdominal distention   Hepatic cirrhosis (HCC)   Ascites   CKD (chronic kidney disease)   Abdominal pain   Sinus tachycardia (HCC)   Protein-calorie malnutrition, severe  1. SVT with strip does appear to be SVT possible AVNRT--per  ER MD there were no flutter waves when he paused with adenosine- I do not have these strips. Rate is still not controlled despite toprol. And BP is borderline. Dr. Sallyanne Kuster to see and eval.   2. Bacterial peritonitis  3. Acites with hepatic cirrhosis  2 taps in last 2 weeks  4. Asthma on albuterol  [facility] R  Nurse Practitioner Certified [place] Pager [phone] or after 5pm or weekends call [phone] 07/03/2015, 3:15 PM    I have seen and  examined the patient along with [facility] R NP.  I have reviewed the chart, notes and Donald data.  I agree with NP's note.  Key Donald complaints: he is unaware of palpitations Key examination changes: rapid regular rhythm, ascites, cachexia Key Donald findings / data: notwithstanding the reported response to Adenosine, the rhythm right now is clearly atrial flutter with 2:1 AV block. A few hours ago, there were periods of occasional 3:1 and 4:1 conduction and the flutter waves are more obvious. The ECG from 06/22/15 and the current ECG are also consistent with atrial flutter. It appears to be typical counterclockwise right atrial flutter. He has advanced liver disease and evidence of both hemodynamic and synthetic complications of cirrhosis (recurrent ascites, mild omental varices, hypoalbuminemia, elevated PT). However, there is little evidence for aggressive disease progression (normal transaminases, negative viral markers). He has evidence of moderate CKD (GFR <50) and volatile renal function.  PLAN:  He is at risk for tachycardia related cardiomyopathy and stroke.  Needs echo to exclude structural heart disease.  There are substantial concerns with the interaction between severe liver problems and management of his atrial flutter:  - anticoagulation is indicated (CHADSVasc 2: age, HTN), but high risk due to increased bleeding complications - conventional rate control meds are unlikely to help: A Flutter is very hard to rate control and his BP will not allow high doses of AV blocking agents. - amiodarone can be used for rate control and rhythm, but brings with it concerns for further liver toxicity - other antiarrhythmics would be even riskier with his liver and renal problems - if amiodarone leads to conversion to sinus rhythm, there is a risk of stroke since he is not formally anticoagulated. - RF ablation and brief anticoagulation could be considered, but he does not want any invasive procedures  and is not a great candidate due to malnutrition and liver disease.  In summary there are no great options. He clearly understands the poor prognosis of his liver disease. He is at peace with dying.  I would suggest a 24 hour load with IV amiodarone, followed by low dose maintenance therapy 200 mg daily, with periodic reevaluation of transaminases. Another option is to load orally, amiodarone 400 mg daily for a month, then 200 mg daily. This will take a long time to achieve rate control. Finally, there is also an option to not treat his atrial flutter. This will likely lead to CHF and further worsening of his fluid retention problems, more frequent paracenteses.  Sanda Klein, MD, [place] [phone] 07/03/2015, 4:20 PM

## 2015-07-04 DIAGNOSIS — I4892 Unspecified atrial flutter: Secondary | ICD-10-CM

## 2015-07-04 MED ORDER — METOPROLOL TARTRATE 25 MG PO TABS
25.0000 mg | ORAL_TABLET | Freq: Four times a day (QID) | ORAL | Status: DC
  Administered 2015-07-04 – 2015-07-05 (×5): 25 mg via ORAL
  Filled 2015-07-04 (×5): qty 1

## 2015-07-04 MED ORDER — POLYETHYLENE GLYCOL 3350 17 G PO PACK
17.0000 g | PACK | Freq: Every day | ORAL | Status: DC
  Administered 2015-07-04 – 2015-07-07 (×4): 17 g via ORAL
  Filled 2015-07-04 (×4): qty 1

## 2015-07-04 NOTE — Progress Notes (Signed)
Patient requesting a laxative.  MD notified.

## 2015-07-04 NOTE — Progress Notes (Signed)
PT Cancellation Note  Patient Details Name: Donald Moreno MRN: [id number] DOB: 1940-11-20   Cancelled Treatment:    Reason Eval/Treat Not Completed: Medical issues which prohibited therapy Noted that cards recommending rate control with medications at this time. Currently, Donald Moreno resting HR in upper 140s. Will follow up tomorrow, hopefull rate will be better controlled for comprehensive PT eval. Of note, RN reports pt has been ambulating to rest room.  Ellouise Newer 07/04/2015, 4:15 PM  Camille Bal [place], [place]

## 2015-07-04 NOTE — Progress Notes (Signed)
Patient refusing bed alarm & chair alarm. Patient educated on fall risk assessment.

## 2015-07-04 NOTE — Progress Notes (Signed)
Family Medicine Teaching Service Daily Progress Note Intern Pager: [phone]  [name] Medical record number: [id number] Date of birth: 1940-11-12 Age: 75 y.o. Gender: male  Primary Care Provider: Luiz Blare, DO Consultants: Cardiology Code Status: DNR  Pt Overview and Major Events to Date:  2/15 - admitted to [facility] 2/16 - paracentesis 2/17 - afib  Assessment and Plan: Donald Moreno is a 75 y.o. male presenting with chronic abdominal distention and new abdominal pain. PMH is significant for remote alcoholism, cirrhosis, asthma, OA, gout.  Abdominal pain/ascites: Initially thought to be SBP based on tachycardia and new abdominal discomfort in presence of ascites, however ascitic fluid analysis ruled out SBP (only 68 WBC and 2 neutrophils). Not septic (qSOFA 1). - Follow blood cultures - NGx2d - Follow ascitic fluid culture - NGx2d - Could consider SBP ppx after discharge given history of ascites and upper GI bleed - Pt of Dr. Erlinda Hong, [facility], who has been made aware of admission - though no formal consult at this time. - Cont Lasix 20 mg, spironolactone 50 mg  - Regular diet with fluid restriction  Atrial flutter:  - Cards consulted - appreciate recs - Digoxin started 2/17 - cardiology continues to recommend amiodarone - Metoprolol increased to 25 qid for ease of titrate, will monitor rate closely today  Asthma: Persistent, chronic, stable. Wheezing with good air exchange on auscultation, saturating 100% on room air without labored breathing all suggest no exacerbation. Maintaining adequate O2 sats on RA.  - Continue formulary dulera for advair and albuterol as needed.  - Pulse ox with vital signs; O2 by [place] prn saturation < 92%  Protein calorie malnutrition: Chronic, taking ensure at home. Cachectic on exam. Presumptive wasting secondary to chronic illness. Long time alcohol and smokeless tobacco use predispose to malignancy. Also seen by OT, who recommended no  follow-up. Pt not evaluated by PT yesterday due to tachycardia at rest.  - Nutrition consult - continue Ensure Enlive PO BID (350kcal and 20g protein in each supplement) - F/u PT recs  CKD: Presumably hepatorenal, Cr at historic baseline. No casts on UA. - Avoid nephrotoxins - I/O  FEN/GI: regular diet, fluid restrict to 1522mL/day Prophylaxis: subQ heparin  Disposition: home pending medical improvement  Subjective:  Patient says he is feeling well. Remains tachycardic, but has no complaints and is asymptomatic.  Objective: Temp:  [97.6 F (36.4 C)-99.2 F (37.3 C)] 97.6 F (36.4 C) (02/18 0945) Pulse Rate:  [107-148] 144 (02/18 0945) Resp:  [18-20] 18 (02/18 0945) BP: (100-107)/(60-79) 107/75 mmHg (02/18 0945) SpO2:  [98 %-100 %] 100 % (02/18 0945) Weight:  [140 lb (63.504 kg)] 140 lb (63.504 kg) (02/18 0434) Physical Exam: General: sitting in chair, in NAD Cardiovascular: tachycardic, regular rhythm, no murmurs appreciated Respiratory: scattered wheezes bilaterally, good air movement, normal work of breathing on RA Abdomen: distended but improved from yesterday; non-tender, +BS Extremities: 1+ pitting edema bilateral LE to midshin Neuro: alert and oriented  Laboratory:  Recent Labs Lab 07/01/15 0841 07/02/15 0604  WBC 7.6 6.3  HGB 11.4* 10.2*  HCT 34.1* 30.6*  PLT 181 182    Recent Labs Lab 07/01/15 0841 07/02/15 0604  NA 140 138  K 5.5* 5.1  CL 109 108  CO2 22 21*  BUN 25* 25*  CREATININE 1.67* 1.57*  CALCIUM 9.2 8.7*  PROT 7.1 6.2*  BILITOT 0.7 0.9  ALKPHOS 132* 101  ALT 17 12*  AST 30 25  GLUCOSE 115* 89   Imaging/Diagnostic Tests: Dg  Chest 2 View 07/01/2015 IMPRESSION: Elevated hemidiaphragms suggesting abdominal ascites as seen previously. Mild basilar atelectasis and fluid in the fissures.    Frazier Richards, MD 07/04/2015, 10:44 AM PGY-3, [place] Intern pager: [phone], text pages welcome

## 2015-07-04 NOTE — Progress Notes (Signed)
Family Medicine Teaching Service Daily Progress Note Intern Pager: [phone]  [name] Medical record number: [id number] Date of birth: [birth year] Age: 75 y.o. Gender: male  Primary Care Provider: Luiz Blare, DO Consultants: Cardiology Code Status: DNR  Pt Overview and Major Events to Date:  2/15 - admitted to [facility] 2/16 - paracentesis 2/17 - found to be in atrial flutter; digoxin started 2/19 - digoxin discontinued; 24-hr amiodarone load started, pt transferred to SDU  Assessment and Plan: Donald Moreno is a 75 y.o. male presenting with chronic abdominal distention and new abdominal pain. PMH is significant for remote alcoholism, cirrhosis, asthma, OA, gout.  Abdominal pain/ascites: Initially thought to be SBP based on tachycardia and new abdominal discomfort in presence of ascites, however ascitic fluid analysis ruled out SBP (only 68 WBC and 2 neutrophils). Not septic (qSOFA 1). - Follow blood cultures - NGx3d - Follow ascitic fluid culture - NGx3d - Could consider SBP ppx after discharge given history of ascites and upper GI bleed - Cont Lasix 20 mg, spironolactone 50 mg  - Regular diet with fluid restriction  Atrial flutter: Initially sinus tachycardia, but a.flutter noted on monitoring and EKG. Remains tachycardic with average HR in 140s despite digoxin.  - Cards consulted - appreciate recs - D/c digoxin, metoprolol - Begin amiodarone load - Transfer to SDU for amiodarone infusion   Asthma: Persistent, chronic, stable. Wheezing with good air exchange on auscultation, saturating 100% on room air without labored breathing all suggest no exacerbation. Maintaining adequate O2 sats on RA.  - Continue formulary dulera for advair and albuterol as needed.  - Pulse ox with vital signs; O2 by [place] prn saturation < 92%  Protein calorie malnutrition: Chronic, taking ensure at home. Cachectic on exam. Presumptive wasting secondary to chronic illness. Long time alcohol  and smokeless tobacco use predispose to malignancy. Also seen by OT, who recommended no follow-up. Pt not evaluated by PT due to tachycardia.  - Nutrition consult - continue Ensure Enlive PO BID (350kcal and 20g protein in each supplement) - F/u PT recs  CKD: Presumably hepatorenal, Cr at historic baseline. No casts on UA. - Avoid nephrotoxins - I/O  FEN/GI: regular diet, fluid restrict to 1534mL/day Prophylaxis: subQ heparin  Disposition: home pending medical improvement  Subjective:  Patient says he feels very well today. No complaints.   Objective: Temp:  [97.6 F (36.4 C)-99 F (37.2 C)] 98.3 F (36.8 C) (02/19 0420) Pulse Rate:  [140-144] 140 (02/19 0420) Resp:  [18-19] 19 (02/18 2020) BP: (91-107)/(66-79) 100/79 mmHg (02/19 0420) SpO2:  [95 %-100 %] 100 % (02/19 0420) Weight:  [139 lb 5.3 oz (63.2 kg)] 139 lb 5.3 oz (63.2 kg) (02/18 2020) Physical Exam: General: sitting up in bed, in NAD Cardiovascular: tachycardic, regular rhythm, no murmurs appreciated Respiratory: scattered wheezes bilaterally, good air movement, normal work of breathing on RA Abdomen: distended but improving; non-tender, +BS Extremities: trace pitting edema bilateral LE to midshin Neuro: A&Ox3  Laboratory:  Recent Labs Lab 07/01/15 0841 07/02/15 0604 07/05/15 0525  WBC 7.6 6.3 6.6  HGB 11.4* 10.2* 10.2*  HCT 34.1* 30.6* 30.9*  PLT 181 182 163    Recent Labs Lab 07/01/15 0841 07/02/15 0604 07/05/15 0525  NA 140 138 137  K 5.5* 5.1 5.4*  CL 109 108 110  CO2 22 21* 21*  BUN 25* 25* 41*  CREATININE 1.67* 1.57* 1.66*  CALCIUM 9.2 8.7* 8.6*  PROT 7.1 6.2* 5.4*  BILITOT 0.7 0.9 0.6  ALKPHOS 132* 101  108  ALT 17 12* 19  AST 30 25 37  GLUCOSE 115* 89 105*   Imaging/Diagnostic Tests: Dg Chest 2 View 07/01/2015 IMPRESSION: Elevated hemidiaphragms suggesting abdominal ascites as seen previously. Mild basilar atelectasis and fluid in the fissures.    Verner Mould,  MD 07/05/2015, 8:17 AM PGY-1, [place] Intern pager: [phone], text pages welcome

## 2015-07-04 NOTE — Progress Notes (Signed)
Patient ID: Eldean Gean, male   DOB: 12-Dec-1940, 75 y.o.   MRN: [id number]    Primary cardiologist:  Subjective:    No palpitations  Objective:   Temp:  [97.9 F (36.6 C)-99.2 F (37.3 C)] 98.5 F (36.9 C) (02/18 0434) Pulse Rate:  [107-148] 144 (02/18 0434) Resp:  [18-20] 20 (02/18 0434) BP: (96-106)/(60-79) 106/68 mmHg (02/18 0434) SpO2:  [98 %-100 %] 99 % (02/18 0434) Weight:  [140 lb (63.504 kg)] 140 lb (63.504 kg) (02/18 0434) Last BM Date: 07/02/15  Filed Weights   07/01/15 2119 07/02/15 2115 07/04/15 0434  Weight: 155 lb 3.3 oz (70.4 kg) 139 lb (63.05 kg) 140 lb (63.504 kg)    Intake/Output Summary (Last 24 hours) at 07/04/15 0653 Last data filed at 07/04/15 0600  Gross per 24 hour  Intake   1305 ml  Output    500 ml  Net    805 ml    Telemetry: aflutter 140  Exam:  General: NAD  HEENT: sclera clear, throat clear  Resp: CTAB  Cardiac: regular, tachy 140, no m/r/g  GI: abdomen soft, NT, ND  MSK: no LE edema  Neuro: no focal deficits  Psych: appropriate affect  Lab Results:  Basic Metabolic Panel:  Recent Labs Lab 07/01/15 0841 07/02/15 0604  NA 140 138  K 5.5* 5.1  CL 109 108  CO2 22 21*  GLUCOSE 115* 89  BUN 25* 25*  CREATININE 1.67* 1.57*  CALCIUM 9.2 8.7*  MG  --  2.0    Liver Function Tests:  Recent Labs Lab 07/01/15 0841 07/02/15 0604  AST 30 25  ALT 17 12*  ALKPHOS 132* 101  BILITOT 0.7 0.9  PROT 7.1 6.2*  ALBUMIN 3.1* 2.7*    CBC:  Recent Labs Lab 07/01/15 0841 07/02/15 0604  WBC 7.6 6.3  HGB 11.4* 10.2*  HCT 34.1* 30.6*  MCV 101.2* 98.7  PLT 181 182    Cardiac Enzymes: No results for input(s): CKTOTAL, CKMB, CKMBINDEX, TROPONINI in the last 168 hours.  BNP: No results for input(s): PROBNP in the last 8760 hours.  Coagulation: No results for input(s): INR in the last 168 hours.  ECG:   Medications:   Scheduled Medications: . cholecalciferol  1,000 Units Oral Daily  . digoxin  0.125 mg Oral  Daily  . feeding supplement (ENSURE ENLIVE)  1 Bottle Oral BID BM  . ferrous sulfate  325 mg Oral BID WC  . furosemide  20 mg Oral Daily  . heparin  5,000 Units Subcutaneous 3 times per day  . metoprolol succinate  25 mg Oral Daily  . mometasone-formoterol  2 puff Inhalation BID  . montelukast  10 mg Oral QHS  . sodium chloride flush  3 mL Intravenous Q12H  . spironolactone  50 mg Oral Daily     Infusions:     PRN Medications:  albuterol     Assessment/Plan    1. Aflutter - rates remain elevated, denies any palpitations.  - difficult to control based on history of liver disease and renal dysfunction, bp's somewhat soft as well - from original consult note recs for amiodarone loading. No anticoag given his history of advanced liver disease. Would not consider DCCV without ability to anticoagulate.  - will change to short acting lopressor with frequent dosing intervals to allow titration to see if can achieve rate control, though with flutter this can be difficult.  - I agree with Dr Orene Desanctis that amio may ultimate be best short term option  with 24 hr IV load followed by oral maintenance.        Carlyle Dolly, M.D.

## 2015-07-05 DIAGNOSIS — K7031 Alcoholic cirrhosis of liver with ascites: Secondary | ICD-10-CM | POA: Insufficient documentation

## 2015-07-05 DIAGNOSIS — K7030 Alcoholic cirrhosis of liver without ascites: Secondary | ICD-10-CM | POA: Insufficient documentation

## 2015-07-05 LAB — COMPREHENSIVE METABOLIC PANEL
ALT: 19 U/L (ref 17–63)
ANION GAP: 6 (ref 5–15)
AST: 37 U/L (ref 15–41)
Albumin: 2.1 g/dL — ABNORMAL LOW (ref 3.5–5.0)
Alkaline Phosphatase: 108 U/L (ref 38–126)
BUN: 41 mg/dL — ABNORMAL HIGH (ref 6–20)
CHLORIDE: 110 mmol/L (ref 101–111)
CO2: 21 mmol/L — ABNORMAL LOW (ref 22–32)
Calcium: 8.6 mg/dL — ABNORMAL LOW (ref 8.9–10.3)
Creatinine, Ser: 1.66 mg/dL — ABNORMAL HIGH (ref 0.61–1.24)
GFR, EST AFRICAN AMERICAN: 46 mL/min — AB (ref >60)
GFR, EST NON AFRICAN AMERICAN: 39 mL/min — AB (ref >60)
Glucose, Bld: 105 mg/dL — ABNORMAL HIGH (ref 65–99)
POTASSIUM: 5.4 mmol/L — AB (ref 3.5–5.1)
Sodium: 137 mmol/L (ref 135–145)
TOTAL PROTEIN: 5.4 g/dL — AB (ref 6.5–8.1)
Total Bilirubin: 0.6 mg/dL (ref 0.3–1.2)

## 2015-07-05 LAB — CBC
HCT: 30.9 % — ABNORMAL LOW (ref 39.0–52.0)
Hemoglobin: 10.2 g/dL — ABNORMAL LOW (ref 13.0–17.0)
MCH: 32.5 pg (ref 26.0–34.0)
MCHC: 33 g/dL (ref 30.0–36.0)
MCV: 98.4 fL (ref 78.0–100.0)
PLATELETS: 163 10*3/uL (ref 150–400)
RBC: 3.14 MIL/uL — ABNORMAL LOW (ref 4.22–5.81)
RDW: 13.7 % (ref 11.5–15.5)
WBC: 6.6 10*3/uL (ref 4.0–10.5)

## 2015-07-05 LAB — BASIC METABOLIC PANEL
Anion gap: 11 (ref 5–15)
BUN: 44 mg/dL — ABNORMAL HIGH (ref 6–20)
CHLORIDE: 100 mmol/L — AB (ref 101–111)
CO2: 21 mmol/L — ABNORMAL LOW (ref 22–32)
Calcium: 8.4 mg/dL — ABNORMAL LOW (ref 8.9–10.3)
Creatinine, Ser: 1.79 mg/dL — ABNORMAL HIGH (ref 0.61–1.24)
GFR, EST AFRICAN AMERICAN: 42 mL/min — AB (ref >60)
GFR, EST NON AFRICAN AMERICAN: 36 mL/min — AB (ref >60)
Glucose, Bld: 112 mg/dL — ABNORMAL HIGH (ref 65–99)
POTASSIUM: 4.8 mmol/L (ref 3.5–5.1)
SODIUM: 132 mmol/L — AB (ref 135–145)

## 2015-07-05 MED ORDER — SODIUM POLYSTYRENE SULFONATE 15 GM/60ML PO SUSP
30.0000 g | Freq: Once | ORAL | Status: AC
  Administered 2015-07-05: 30 g via ORAL
  Filled 2015-07-05: qty 120

## 2015-07-05 MED ORDER — AMIODARONE HCL IN DEXTROSE 360-4.14 MG/200ML-% IV SOLN
60.0000 mg/h | INTRAVENOUS | Status: AC
  Administered 2015-07-05: 60 mg/h via INTRAVENOUS
  Filled 2015-07-05 (×2): qty 200

## 2015-07-05 MED ORDER — AMIODARONE LOAD VIA INFUSION
150.0000 mg | Freq: Once | INTRAVENOUS | Status: AC
  Administered 2015-07-05: 150 mg via INTRAVENOUS
  Filled 2015-07-05: qty 83.34

## 2015-07-05 MED ORDER — AMIODARONE HCL IN DEXTROSE 360-4.14 MG/200ML-% IV SOLN
30.0000 mg/h | INTRAVENOUS | Status: DC
  Administered 2015-07-06: 30 mg/h via INTRAVENOUS
  Filled 2015-07-05 (×3): qty 200

## 2015-07-05 NOTE — Progress Notes (Signed)
Report called to [place] on 2W.  All questions answered.  Denise with [facility] notified of room assignment.  Patient just received dinner. Will transfer patient after he finishes.

## 2015-07-05 NOTE — Progress Notes (Signed)
PT Cancellation Note  Patient Details Name: Donald Moreno MRN: [id number] DOB: [birth year]   Cancelled Treatment:    Reason Eval/Treat Not Completed: Medical issues which prohibited therapy Patient on Amiodarone drip, transferring to SDU. Will hold PT at this time.  Ellouise Newer 07/05/2015, 4:06 PM  Elayne Snare, [place]

## 2015-07-05 NOTE — Progress Notes (Signed)
Family Medicine Teaching Service Daily Progress Note Intern Pager: [phone]  [name] Medical record number: [id number] Date of birth: November 22, 1940 Age: 75 y.o. Gender: male  Primary Care Provider: Luiz Blare, DO Consultants: Cardiology Code Status: DNR  Pt Overview and Major Events to Date:  2/15 - admitted to [facility] 2/16 - paracentesis 2/17 - found to be in atrial flutter; digoxin started 2/19 - digoxin discontinued; 24-hr amiodarone load started, pt transferred to SDU 2/20 - PO amiodarone started  Assessment and Plan: Donald Moreno is a 75 y.o. male presenting with chronic abdominal distention and new abdominal pain. PMH is significant for remote alcoholism, cirrhosis, asthma, OA, gout.  Abdominal pain/ascites: Initially thought to be SBP based on tachycardia and new abdominal discomfort in presence of ascites, however ascitic fluid analysis ruled out SBP (only 68 WBC and 2 neutrophils). Not septic (qSOFA 1). Pain now resolved.  - Follow blood cultures - NGx4d - Follow ascitic fluid culture - NGx4d - Could consider SBP ppx after discharge given history of ascites and upper GI bleed - Cont Lasix 20 mg, spironolactone 50 mg  - Regular diet with fluid restriction  Atrial flutter: Initially sinus tachycardia, but a.flutter noted on monitoring and EKG. HR improved since beginning amiodarone; average HR in 60s overnight.  - Cards consulted - appreciate recs - Transition to PO amiodarone 200 mg BID today  Asthma: Persistent, chronic, stable. Wheezing with good air exchange on auscultation, saturating 100% on room air without labored breathing all suggest no exacerbation. Maintaining adequate O2 sats on RA.  - Continue formulary dulera for advair and albuterol as needed.  - Pulse ox with vital signs; O2 by [place] prn saturation < 92%  Protein calorie malnutrition: Chronic, taking ensure at home. Cachectic on exam. Presumptive wasting secondary to chronic illness. Long time  alcohol and smokeless tobacco use predispose to malignancy. Also seen by OT, who recommended no follow-up. Pt not evaluated by PT due to tachycardia/amiodarone drip.  - Nutrition consult - continue Ensure Enlive PO BID (350kcal and 20g protein in each supplement) - F/u PT recs once HR normalized and off drip  CKD: Presumably hepatorenal, Cr at historic baseline. No casts on UA. - Avoid nephrotoxins - I/O  FEN/GI: regular diet, fluid restrict to 1537mL/day Prophylaxis: subQ heparin  Disposition: home pending medical improvement  Subjective:  Patient with no complaints this AM. Says he feels no different now that his HR has stabilized.   Objective: Temp:  [98.4 F (36.9 C)-98.8 F (37.1 C)] 98.4 F (36.9 C) (02/20 0406) Pulse Rate:  [62-75] 62 (02/20 0406) Resp:  [18] 18 (02/20 0406) BP: (95-114)/(62-68) 108/65 mmHg (02/20 0406) SpO2:  [100 %] 100 % (02/20 0406) Physical Exam: General: sitting on side of bed eating breakfast, in NAD Cardiovascular: RRR, no murmurs appreciated Respiratory: scattered wheezes bilaterally, good air movement, normal work of breathing on RA Abdomen: distended; non-tender, +BS Extremities: no edema Neuro: A&Ox3  Laboratory:  Recent Labs Lab 07/01/15 0841 07/02/15 0604 07/05/15 0525  WBC 7.6 6.3 6.6  HGB 11.4* 10.2* 10.2*  HCT 34.1* 30.6* 30.9*  PLT 181 182 163    Recent Labs Lab 07/01/15 0841 07/02/15 0604 07/05/15 0525 07/05/15 1942 07/06/15 0551  NA 140 138 137 132* 137  K 5.5* 5.1 5.4* 4.8 4.9  CL 109 108 110 100* 107  CO2 22 21* 21* 21* 23  BUN 25* 25* 41* 44* 45*  CREATININE 1.67* 1.57* 1.66* 1.79* 1.68*  CALCIUM 9.2 8.7* 8.6* 8.4* 8.8*  PROT 7.1 6.2* 5.4*  --   --   BILITOT 0.7 0.9 0.6  --   --   ALKPHOS 132* 101 108  --   --   ALT 17 12* 19  --   --   AST 30 25 37  --   --   GLUCOSE 115* 89 105* 112* 114*   Imaging/Diagnostic Tests: Dg Chest 2 View 07/01/2015 IMPRESSION: Elevated hemidiaphragms suggesting abdominal  ascites as seen previously. Mild basilar atelectasis and fluid in the fissures.    Verner Mould, MD 07/06/2015, 9:18 AM PGY-1, [place] Intern pager: [phone], text pages welcome

## 2015-07-05 NOTE — Progress Notes (Signed)
Notified by CMT HR 55 sustaining.  Denise with [facility] and Dr. Katrinka Blazing notified.  No new orders given at this time.  Will continue to monitor.

## 2015-07-05 NOTE — Progress Notes (Addendum)
Interim Note:  Spoke with cardiology regarding patient's low HR in 55 while in flutter while on IV Amiodarone started this morning. Cardiology recommends continuing IV Amiodarone load and transition to PO 200 BID tomorrow. Recommended stopping IV if patient's HR is below 50.   Smiley Houseman, MD PGY 1 Family Medicine

## 2015-07-05 NOTE — Progress Notes (Signed)
    SUBJECTIVE:  Denies any pain.  No acute SOB.  Abdomen still distended    PHYSICAL EXAM Filed Vitals:   07/04/15 2020 07/04/15 2106 07/05/15 0420 07/05/15 1000  BP: 91/70  100/79 95/62  Pulse: 144  140 75  Temp: 99 F (37.2 C)  98.3 F (36.8 C) 98.6 F (37 C)  TempSrc: Oral  Oral Oral  Resp: 19   18  Height:      Weight: 139 lb 5.3 oz (63.2 kg)     SpO2: 95% 98% 100% 100%   General:  No acute distress Lungs:  Lungs clear Heart:  Irregular Abdomen:  Distended Extremities:  Mild right leg edema   LABS:  Results for orders placed or performed during the hospital encounter of 07/01/15 (from the past 24 hour(s))  CBC     Status: Abnormal   Collection Time: 07/05/15  5:25 AM  Result Value Ref Range   WBC 6.6 4.0 - 10.5 K/uL   RBC 3.14 (L) 4.22 - 5.81 MIL/uL   Hemoglobin 10.2 (L) 13.0 - 17.0 g/dL   HCT 30.9 (L) 39.0 - 52.0 %   MCV 98.4 78.0 - 100.0 fL   MCH 32.5 26.0 - 34.0 pg   MCHC 33.0 30.0 - 36.0 g/dL   RDW 13.7 11.5 - 15.5 %   Platelets 163 150 - 400 K/uL  Comprehensive metabolic panel     Status: Abnormal   Collection Time: 07/05/15  5:25 AM  Result Value Ref Range   Sodium 137 135 - 145 mmol/L   Potassium 5.4 (H) 3.5 - 5.1 mmol/L   Chloride 110 101 - 111 mmol/L   CO2 21 (L) 22 - 32 mmol/L   Glucose, Bld 105 (H) 65 - 99 mg/dL   BUN 41 (H) 6 - 20 mg/dL   Creatinine, Ser 1.66 (H) 0.61 - 1.24 mg/dL   Calcium 8.6 (L) 8.9 - 10.3 mg/dL   Total Protein 5.4 (L) 6.5 - 8.1 g/dL   Albumin 2.1 (L) 3.5 - 5.0 g/dL   AST 37 15 - 41 U/L   ALT 19 17 - 63 U/L   Alkaline Phosphatase 108 38 - 126 U/L   Total Bilirubin 0.6 0.3 - 1.2 mg/dL   GFR calc non Af Amer 39 (L) >60 mL/min   GFR calc Af Amer 46 (L) >60 mL/min   Anion gap 6 5 - 15    Intake/Output Summary (Last 24 hours) at 07/05/15 1348 Last data filed at 07/05/15 0600  Gross per 24 hour  Intake    300 ml  Output      0 ml  Net    300 ml     ASSESSMENT AND PLAN:  Atrial flutter:   Now on amiodarone.   Continue IV for 24 hours and then switch to PO of rate controlled.  Unable to use anticoagulation.     Jeneen Rinks [facility] 07/05/2015 1:48 PM

## 2015-07-06 DIAGNOSIS — I10 Essential (primary) hypertension: Secondary | ICD-10-CM

## 2015-07-06 DIAGNOSIS — R140 Abdominal distension (gaseous): Secondary | ICD-10-CM

## 2015-07-06 LAB — BASIC METABOLIC PANEL
ANION GAP: 7 (ref 5–15)
BUN: 45 mg/dL — ABNORMAL HIGH (ref 6–20)
CALCIUM: 8.8 mg/dL — AB (ref 8.9–10.3)
CO2: 23 mmol/L (ref 22–32)
Chloride: 107 mmol/L (ref 101–111)
Creatinine, Ser: 1.68 mg/dL — ABNORMAL HIGH (ref 0.61–1.24)
GFR, EST AFRICAN AMERICAN: 45 mL/min — AB (ref >60)
GFR, EST NON AFRICAN AMERICAN: 39 mL/min — AB (ref >60)
Glucose, Bld: 114 mg/dL — ABNORMAL HIGH (ref 65–99)
Potassium: 4.9 mmol/L (ref 3.5–5.1)
Sodium: 137 mmol/L (ref 135–145)

## 2015-07-06 LAB — CULTURE, BLOOD (ROUTINE X 2)
CULTURE: NO GROWTH
Culture: NO GROWTH

## 2015-07-06 LAB — CULTURE, BODY FLUID W GRAM STAIN -BOTTLE: Culture: NO GROWTH

## 2015-07-06 LAB — CULTURE, BODY FLUID-BOTTLE

## 2015-07-06 MED ORDER — AMIODARONE HCL 200 MG PO TABS
200.0000 mg | ORAL_TABLET | Freq: Two times a day (BID) | ORAL | Status: DC
  Administered 2015-07-06 – 2015-07-07 (×3): 200 mg via ORAL
  Filled 2015-07-06 (×3): qty 1

## 2015-07-06 NOTE — Evaluation (Signed)
Physical Therapy Evaluation Patient Details Name: Donald Moreno MRN: [id number] DOB: 1941-04-07 Today's Date: 07/06/2015   History of Present Illness  Pt is a 75 y/o M w/ h/o liver cirrhosis and ascites w/ paracentesis and tachycardia 06/22/15.  He came to the ER on 07/01/15 w/ increased abdominal pain, paracentesis completed in ER.  Pt found to be in atrial flutter on 2/17. Pt w/ sinus tachycardia, bacterial peritonitis, ascites.  Pt's PMH includes CKD, hepatic cirrhosis, ETOH abuse, gout.  Clinical Impression  Pt admitted with above diagnosis. Pt currently with functional limitations due to the deficits listed below (see PT Problem List). Donald Moreno presents w/ balance impairments w/ ambulation, requiring min guard assist.  He will have intermittent assist available from his family upon d/c.  Pt will benefit from skilled PT to increase their independence and safety with mobility to allow discharge to the venue listed below.       Follow Up Recommendations Home health PT;Supervision - Intermittent (to address balance impairments)    Equipment Recommendations  None recommended by PT    Recommendations for Other Services       Precautions / Restrictions Precautions Precautions: Fall Restrictions Weight Bearing Restrictions: No      Mobility  Bed Mobility Overal bed mobility: Modified Independent             General bed mobility comments: HOB elevated  Transfers Overall transfer level: Needs assistance Equipment used: None Transfers: Sit to/from Stand Sit to Stand: Supervision         General transfer comment: Supervision for safety.  No physical assist or cues needed.  Ambulation/Gait Ambulation/Gait assistance: Min guard Ambulation Distance (Feet): 80 Feet Assistive device: None Gait Pattern/deviations: Step-through pattern;Decreased stride length;Trunk flexed;Ataxic   Gait velocity interpretation: Below normal speed for age/gender General Gait Details: Slower  gait speed and mildly ataxic Lt LE. Min instability and min guard provided for pt's safety. No increased instability noted w/ high level balance activities listed below.  Stairs            Wheelchair Mobility    Modified Rankin (Stroke Patients Only)       Balance Overall balance assessment: Needs assistance Sitting-balance support: No upper extremity supported;Feet supported Sitting balance-Leahy Scale: Good     Standing balance support: No upper extremity supported;During functional activity Standing balance-Leahy Scale: Fair                               Pertinent Vitals/Pain Pain Assessment: No/denies pain    Home Living Family/patient expects to be discharged to:: Private residence Living Arrangements: Other relatives (niece and nephew who work during day) Available Help at Discharge: Family;Available PRN/intermittently Type of Home: House Home Access: Level entry     Home Layout: One level Home Equipment: Walker - 2 wheels;Cane - single point;Crutches;Wheelchair - manual      Prior Function Level of Independence: Independent         Comments: drives, Ind w/ all mobility and ADLs per pt     Hand Dominance   Dominant Hand: Right    Extremity/Trunk Assessment   Upper Extremity Assessment: Defer to OT evaluation           Lower Extremity Assessment: LLE deficits/detail         Communication   Communication: No difficulties  Cognition Arousal/Alertness: Awake/alert Behavior During Therapy: WFL for tasks assessed/performed Overall Cognitive Status: Within Functional Limits for tasks assessed  General Comments General comments (skin integrity, edema, etc.): HR ranging in 80's and 90's during ambulation.    Exercises        Assessment/Plan    PT Assessment Patient needs continued PT services  PT Diagnosis Difficulty walking;Abnormality of gait   PT Problem List Decreased activity  tolerance;Decreased balance;Decreased coordination;Decreased safety awareness  PT Treatment Interventions DME instruction;Gait training;Functional mobility training;Therapeutic activities;Therapeutic exercise;Balance training;Neuromuscular re-education;Patient/family education   PT Goals (Current goals can be found in the Care Plan section) Acute Rehab PT Goals Patient Stated Goal: to go home PT Goal Formulation: With patient Time For Goal Achievement: 07/20/15 Potential to Achieve Goals: Good    Frequency Min 3X/week   Barriers to discharge Decreased caregiver support Intermittent assist available at home    Co-evaluation               End of Session Equipment Utilized During Treatment: Gait belt Activity Tolerance: Patient tolerated treatment well;Patient limited by fatigue Patient left: in chair;with call bell/phone within reach;with chair alarm set Nurse Communication: Mobility status         Time: [id number] PT Time Calculation (min) (ACUTE ONLY): 17 min   Charges:   PT Evaluation $PT Eval Low Complexity: 1 Procedure     PT G Codes:       Joslyn Hy PT, DPT [phone] Pager: [phone] 07/06/2015, 1:07 PM

## 2015-07-06 NOTE — Care Management Important Message (Signed)
Important Message  Patient Details  Name: Donald Moreno MRN: [id number] Date of Birth: [birth year]   Medicare Important Message Given:  Yes    Loann Quill 07/06/2015, 11:10 AM

## 2015-07-06 NOTE — Progress Notes (Signed)
CCMD notified that patient had a run of bigeminy. He is now in sinus rhythm. Will continue to monitor.

## 2015-07-06 NOTE — Progress Notes (Signed)
Family Medicine Teaching Service Daily Progress Note Intern Pager: [phone]  [name] Medical record number: [id number] Date of birth: Apr 13, 1941 Age: 75 y.o. Gender: male  Primary Care Provider: Luiz Blare, DO Consultants: Cardiology Code Status: DNR  Pt Overview and Major Events to Date:  2/15 - admitted to [facility] 2/16 - paracentesis 2/17 - found to be in atrial flutter; digoxin started 2/19 - digoxin discontinued; 24-hr amiodarone load started, pt transferred to SDU 2/20 - PO amiodarone started  Assessment and Plan: Donald Moreno is a 75 y.o. male presenting with chronic abdominal distention and new abdominal pain. PMH is significant for remote alcoholism, cirrhosis, asthma, OA, gout.  Abdominal pain/ascites: Initially thought to be SBP based on tachycardia and new abdominal discomfort in presence of ascites, however ascitic fluid analysis ruled out SBP (only 68 WBC and 2 neutrophils). Not septic (qSOFA 1). Pain now resolved. Blood and ascitic cultures with no growth x5d (final).  - Could consider SBP ppx after discharge given history of ascites and upper GI bleed - Cont Lasix 20 mg, spironolactone 50 mg  - Regular diet with fluid restriction  Atrial flutter: Initially sinus tachycardia, but a.flutter noted on monitoring and EKG. HR improved since beginning amiodarone. Transitioned to PO amiodarone yesterday. Average HR remained in 60s overnight.  - Cards consulted - appreciate recs - Continue PO amiodarone 200 mg BID today  Asthma: Persistent, chronic, stable. Wheezing with good air exchange on auscultation, saturating 100% on room air without labored breathing all suggest no exacerbation. Maintaining adequate O2 sats on RA.  - Continue formulary dulera for advair and albuterol as needed.  - Pulse ox with vital signs; O2 by [place] prn saturation < 92%  Protein calorie malnutrition: Chronic, taking ensure at home. Cachectic on exam. Presumptive wasting secondary to  chronic illness. Long time alcohol and smokeless tobacco use predispose to malignancy. Also seen by OT, who recommended no follow-up. Per PT, needs intermittent HH for balance issues.  - Nutrition consult - continue Ensure Enlive PO BID (350kcal and 20g protein in each supplement)  CKD: Presumably hepatorenal, Cr at historic baseline. No casts on UA. - Avoid nephrotoxins - I/O  FEN/GI: regular diet, fluid restrict to 1542mL/day Prophylaxis: subQ heparin  Disposition: likely home today as HR has improved  Subjective:  Patient with no complaints this AM. Would like to go home today.   Objective: Temp:  [98 F (36.7 C)-98.5 F (36.9 C)] 98 F (36.7 C) (02/21 0511) Pulse Rate:  [64-72] 72 (02/21 0511) Resp:  [18-19] 18 (02/21 0511) BP: (113-122)/(63-69) 121/69 mmHg (02/21 0511) SpO2:  [99 %-100 %] 99 % (02/21 0842) Physical Exam: General: sitting on side of bed eating breakfast, in NAD Cardiovascular: RRR, no murmurs appreciated Respiratory: scattered wheezes bilaterally, good air movement, normal work of breathing on RA Abdomen: distended; non-tender, +BS Extremities: 1+ pitting edema bilaterally Neuro: A&Ox3, no focal deficits   Laboratory:  Recent Labs Lab 07/01/15 0841 07/02/15 0604 07/05/15 0525  WBC 7.6 6.3 6.6  HGB 11.4* 10.2* 10.2*  HCT 34.1* 30.6* 30.9*  PLT 181 182 163    Recent Labs Lab 07/01/15 0841 07/02/15 0604 07/05/15 0525 07/05/15 1942 07/06/15 0551  NA 140 138 137 132* 137  K 5.5* 5.1 5.4* 4.8 4.9  CL 109 108 110 100* 107  CO2 22 21* 21* 21* 23  BUN 25* 25* 41* 44* 45*  CREATININE 1.67* 1.57* 1.66* 1.79* 1.68*  CALCIUM 9.2 8.7* 8.6* 8.4* 8.8*  PROT 7.1 6.2* 5.4*  --   --  BILITOT 0.7 0.9 0.6  --   --   ALKPHOS 132* 101 108  --   --   ALT 17 12* 19  --   --   AST 30 25 37  --   --   GLUCOSE 115* 89 105* 112* 114*   Imaging/Diagnostic Tests: Dg Chest 2 View 07/01/2015 IMPRESSION: Elevated hemidiaphragms suggesting abdominal ascites as  seen previously. Mild basilar atelectasis and fluid in the fissures.    Verner Mould, MD 07/07/2015, 9:15 AM PGY-1, [place] Intern pager: [phone], text pages welcome

## 2015-07-06 NOTE — Progress Notes (Signed)
Patient Name:  Donald Moreno, DOB: 04/26/1941, MRN: [id number] Primary Doctor: Luiz Blare, DO   Date: 07/06/2015   SUBJECTIVE: Patient feels well this morning. He is not having any chest pain.   Past Medical History  Diagnosis Date  . Arthritis   . Gout   . Asthma   . Hypertension   . ETOH abuse    Filed Vitals:   07/05/15 1000 07/05/15 1703 07/05/15 1953 07/06/15 0406  BP: 95/62 114/68 110/67 108/65  Pulse: 75 63 65 62  Temp: 98.6 F (37 C) 98.7 F (37.1 C) 98.8 F (37.1 C) 98.4 F (36.9 C)  TempSrc: Oral Oral Oral Oral  Resp: 18 18 18 18   Height:      Weight:      SpO2: 100% 100% 100% 100%    Intake/Output Summary (Last 24 hours) at 07/06/15 0701 Last data filed at 07/06/15 0300  Gross per 24 hour  Intake 3875.81 ml  Output    100 ml  Net 3775.81 ml   Filed Weights   07/02/15 2115 07/04/15 0434 07/04/15 2020  Weight: 139 lb (63.05 kg) 140 lb (63.504 kg) 139 lb 5.3 oz (63.2 kg)     LABS: Basic Metabolic Panel:  Recent Labs  07/05/15 1942 07/06/15 0551  NA 132* 137  K 4.8 4.9  CL 100* 107  CO2 21* 23  GLUCOSE 112* 114*  BUN 44* 45*  CREATININE 1.79* 1.68*  CALCIUM 8.4* 8.8*   Liver Function Tests:  Recent Labs  07/05/15 0525  AST 37  ALT 19  ALKPHOS 108  BILITOT 0.6  PROT 5.4*  ALBUMIN 2.1*   No results for input(s): LIPASE, AMYLASE in the last 72 hours. CBC:  Recent Labs  07/05/15 0525  WBC 6.6  HGB 10.2*  HCT 30.9*  MCV 98.4  PLT 163   Cardiac Enzymes: No results for input(s): CKTOTAL, CKMB, CKMBINDEX, TROPONINI in the last 72 hours. BNP: Invalid input(s): POCBNP D-Dimer: No results for input(s): DDIMER in the last 72 hours. Thyroid Function Tests: No results for input(s): TSH, T4TOTAL, T3FREE, THYROIDAB in the last 72 hours.  Invalid input(s): FREET3  RADIOLOGY: Dg Chest 2 View  07/01/2015  CLINICAL DATA:  Tachycardia with abdominal pain and distension. EXAM: CHEST  2 VIEW COMPARISON:  01/24/2015 FINDINGS:  Heart and mediastinal shadows are normal. Diaphragms are elevated consistent with the presence of abdominal ascites. There is mild atelectasis at the lung bases. There is a small amount of fluid in the fissures. No acute bone finding. IMPRESSION: Elevated hemidiaphragms suggesting abdominal ascites as seen previously. Mild basilar atelectasis and fluid in the fissures. Electronically Signed   By: Nelson Chimes M.D.   On: 07/01/2015 10:09   US Paracentesis  07/02/2015  INDICATION: Abdominal distention secondary to recurrent ascites. Request therapeutic paracentesis. EXAM: ULTRASOUND GUIDED RIGHT LOWER QUADRANT PARACENTESIS MEDICATIONS: None. COMPLICATIONS: None immediate. PROCEDURE: Informed written consent was obtained from the patient after a discussion of the risks, benefits and alternatives to treatment. A timeout was performed prior to the initiation of the procedure. Initial ultrasound scanning demonstrates a large amount of ascites within the right lower abdominal quadrant. The right lower abdomen was prepped and draped in the usual sterile fashion. 1% lidocaine with epinephrine was used for local anesthesia. Following this, a Safe-T-Centesis catheter was introduced. An ultrasound image was saved for documentation purposes. The paracentesis was performed. The catheter was removed and a dressing was applied. The patient tolerated the procedure well without immediate post  procedural complication. FINDINGS: A total of approximately 10.8 L of clear yellow fluid was removed. IMPRESSION: Successful ultrasound-guided paracentesis yielding 10.8 liters of peritoneal fluid. Read by: Ascencion Dike PA-C Electronically Signed   By: Markus Daft M.D.   On: 07/02/2015 14:23   US Paracentesis  06/22/2015  INDICATION: Cirrhosis, recurrent ascites. Request is made for therapeutic paracentesis up to 7 liters. EXAM: ULTRASOUND GUIDED THERAPEUTIC PARACENTESIS MEDICATIONS: None. COMPLICATIONS: None immediate. PROCEDURE: Informed  written consent was obtained from the patient after a discussion of the risks, benefits and alternatives to treatment. A timeout was performed prior to the initiation of the procedure. Initial ultrasound scanning demonstrates a large amount of ascites within the right lower abdominal quadrant. The right lower abdomen was prepped and draped in the usual sterile fashion. 1% lidocaine was used for local anesthesia. Following this, a Yueh catheter was introduced. An ultrasound image was saved for documentation purposes. The paracentesis was performed. The catheter was removed and a dressing was applied. The patient tolerated the procedure well without immediate post procedural complication. FINDINGS: A total of approximately 7 liters of slightly hazy, yellow fluid was removed. IMPRESSION: Successful ultrasound-guided therapeutic paracentesis yielding 7 liters liters of peritoneal fluid. The patient will receive IV albumin infusion postprocedure . Read by: Rowe Yahshua, PA-C Electronically Signed   By: Aletta Edouard M.D.   On: 06/22/2015 12:29    PHYSICAL EXAM  the patient responds appropriately. He is thin and frail. Lungs reveal scattered rhonchi. Cardiac exam reveals an S1 with an S2. His rhythm sounds regular with his underlying atrial flutter at this time. There is no peripheral edema.   TELEMETRY: I have reviewed telemetry today July 06, 2015. Atrial flutter continues. The rate is 65.    ASSESSMENT AND PLAN:     Typical atrial flutter (HCC)     Atrial flutter continues. He is on IV amiodarone at this time. We will continue with the plan to switch him to oral amiodarone. We will use a loading dose on the lower side because of his relatively low heart rate.           Dola Argyle 07/06/2015 7:01 AM

## 2015-07-06 NOTE — Progress Notes (Signed)
Utilization review completed.  

## 2015-07-07 DIAGNOSIS — R1084 Generalized abdominal pain: Secondary | ICD-10-CM

## 2015-07-07 MED ORDER — SPIRONOLACTONE 50 MG PO TABS: 50.0000 mg | ORAL_TABLET | Freq: Every day | ORAL | Status: DC

## 2015-07-07 MED ORDER — FUROSEMIDE 20 MG PO TABS: 20.0000 mg | ORAL_TABLET | Freq: Every day | ORAL | Status: DC

## 2015-07-07 MED ORDER — AMIODARONE HCL 200 MG PO TABS
200.0000 mg | ORAL_TABLET | Freq: Every day | ORAL | Status: DC
Start: 2015-07-07 — End: 2015-07-14

## 2015-07-07 NOTE — Care Management Note (Signed)
Case Management Note Marvetta Gibbons RN, BSN Unit 2W-Case Manager [phone] Patient Details  Name: Donald Moreno MRN: [id number] Date of Birth: July 20, 1940  Subjective/Objective:    Pt admitted with abd pain and new aflutter                Action/Plan:  PTA pt lived at home - plan to return home with [facility] services- order placed for HH-PT- spoke with pt at bedside- who is agreeable to services- choice offered for [facility] agencies- pt states he does not have a preference - is agreeable to [facility] - referral called to Lelan Pons with [facility] for HH-PT- per pt he does not have any DME needs- has RW, scooter, w/c at home- no further CM needs noted.   Expected Discharge Date:   07/07/15               Expected Discharge Plan:  [place]  In-House Referral:     Discharge planning Services  CM Consult  Post Acute Care Choice:  Home Health Choice offered to:  Patient  DME Arranged:    DME Agency:     HH Arranged:  PT [place]:  [place]  Status of Service:  Completed, signed off  Medicare Important Message Given:  Yes Date Medicare IM Given:    Medicare IM give by:    Date Additional Medicare IM Given:    Additional Medicare Important Message give by:     If discussed at [place] of Stay Meetings, dates discussed:    Discharge Disposition: Home/home health   Additional Comments:  Dawayne Patricia, RN 07/07/2015, 11:23 AM

## 2015-07-07 NOTE — Progress Notes (Signed)
Patient Name:  Donald Moreno, DOB: February 02, 1941, MRN: [id number] Primary Doctor: Luiz Blare, DO Primary Cardiologist:   Date: 07/07/2015   SUBJECTIVE:  Patient is stable. Amiodarone was changed from IV to by mouth yesterday.   Past Medical History  Diagnosis Date  . Arthritis   . Gout   . Asthma   . Hypertension   . ETOH abuse    Filed Vitals:   07/06/15 1251 07/06/15 2041 07/07/15 0511 07/07/15 0842  BP: 113/63 122/64 121/69   Pulse: 67 64 72   Temp: 98.1 F (36.7 C) 98.5 F (36.9 C) 98 F (36.7 C)   TempSrc: Oral Oral Oral   Resp: 19 18 18    Height:      Weight:      SpO2: 100% 100% 100% 99%    Intake/Output Summary (Last 24 hours) at 07/07/15 1002 Last data filed at 07/07/15 0800  Gross per 24 hour  Intake    675 ml  Output      0 ml  Net    675 ml   Filed Weights   07/02/15 2115 07/04/15 0434 07/04/15 2020  Weight: 139 lb (63.05 kg) 140 lb (63.504 kg) 139 lb 5.3 oz (63.2 kg)     LABS: Basic Metabolic Panel:  Recent Labs  07/05/15 1942 07/06/15 0551  NA 132* 137  K 4.8 4.9  CL 100* 107  CO2 21* 23  GLUCOSE 112* 114*  BUN 44* 45*  CREATININE 1.79* 1.68*  CALCIUM 8.4* 8.8*   Liver Function Tests:  Recent Labs  07/05/15 0525  AST 37  ALT 19  ALKPHOS 108  BILITOT 0.6  PROT 5.4*  ALBUMIN 2.1*   No results for input(s): LIPASE, AMYLASE in the last 72 hours. CBC:  Recent Labs  07/05/15 0525  WBC 6.6  HGB 10.2*  HCT 30.9*  MCV 98.4  PLT 163   Cardiac Enzymes: No results for input(s): CKTOTAL, CKMB, CKMBINDEX, TROPONINI in the last 72 hours. BNP: Invalid input(s): POCBNP D-Dimer: No results for input(s): DDIMER in the last 72 hours. Thyroid Function Tests: No results for input(s): TSH, T4TOTAL, T3FREE, THYROIDAB in the last 72 hours.  Invalid input(s): FREET3  RADIOLOGY: Dg Chest 2 View  07/01/2015  CLINICAL DATA:  Tachycardia with abdominal pain and distension. EXAM: CHEST  2 VIEW COMPARISON:  01/24/2015 FINDINGS:  Heart and mediastinal shadows are normal. Diaphragms are elevated consistent with the presence of abdominal ascites. There is mild atelectasis at the lung bases. There is a small amount of fluid in the fissures. No acute bone finding. IMPRESSION: Elevated hemidiaphragms suggesting abdominal ascites as seen previously. Mild basilar atelectasis and fluid in the fissures. Electronically Signed   By: Nelson Chimes M.D.   On: 07/01/2015 10:09   US Paracentesis  07/02/2015  INDICATION: Abdominal distention secondary to recurrent ascites. Request therapeutic paracentesis. EXAM: ULTRASOUND GUIDED RIGHT LOWER QUADRANT PARACENTESIS MEDICATIONS: None. COMPLICATIONS: None immediate. PROCEDURE: Informed written consent was obtained from the patient after a discussion of the risks, benefits and alternatives to treatment. A timeout was performed prior to the initiation of the procedure. Initial ultrasound scanning demonstrates a large amount of ascites within the right lower abdominal quadrant. The right lower abdomen was prepped and draped in the usual sterile fashion. 1% lidocaine with epinephrine was used for local anesthesia. Following this, a Safe-T-Centesis catheter was introduced. An ultrasound image was saved for documentation purposes. The paracentesis was performed. The catheter was removed and a dressing was applied. The  patient tolerated the procedure well without immediate post procedural complication. FINDINGS: A total of approximately 10.8 L of clear yellow fluid was removed. IMPRESSION: Successful ultrasound-guided paracentesis yielding 10.8 liters of peritoneal fluid. Read by: Ascencion Dike PA-C Electronically Signed   By: Markus Daft M.D.   On: 07/02/2015 14:23   US Paracentesis  06/22/2015  INDICATION: Cirrhosis, recurrent ascites. Request is made for therapeutic paracentesis up to 7 liters. EXAM: ULTRASOUND GUIDED THERAPEUTIC PARACENTESIS MEDICATIONS: None. COMPLICATIONS: None immediate. PROCEDURE: Informed  written consent was obtained from the patient after a discussion of the risks, benefits and alternatives to treatment. A timeout was performed prior to the initiation of the procedure. Initial ultrasound scanning demonstrates a large amount of ascites within the right lower abdominal quadrant. The right lower abdomen was prepped and draped in the usual sterile fashion. 1% lidocaine was used for local anesthesia. Following this, a Yueh catheter was introduced. An ultrasound image was saved for documentation purposes. The paracentesis was performed. The catheter was removed and a dressing was applied. The patient tolerated the procedure well without immediate post procedural complication. FINDINGS: A total of approximately 7 liters of slightly hazy, yellow fluid was removed. IMPRESSION: Successful ultrasound-guided therapeutic paracentesis yielding 7 liters liters of peritoneal fluid. The patient will receive IV albumin infusion postprocedure . Read by: Rowe Granger, PA-C Electronically Signed   By: Aletta Edouard M.D.   On: 06/22/2015 12:29     TELEMETRY:  I have personally reviewed telemetry today July 07, 2015. There is atrial flutter with a controlled rate.   ASSESSMENT AND PLAN:    Typical atrial flutter (HCC)      Amiodarone is being used. We need to be very careful with this drug in this patient with liver abnormalities. Today I'm going to change his dose to only 200 mg daily. I would suggest careful outpatient follow-up. If his rate remains controlled, his dose could be weaned down to 100 mg daily.   Dola Argyle 07/07/2015 10:02 AM

## 2015-07-07 NOTE — Discharge Instructions (Signed)
You were admitted for ascites (fluid build up in your abdomen). While in the hospital, you were found to have an irregular heartbeat (known as atrial flutter). We started a new medication to control this, and your heart rate improved.   Please continue to take amiodarone 200 mg once a day to control your heart rate.   You were also started on two medications to help prevent fluid build up from happening again. Please continue taking Lasix 20 mg daily, and spironolactone 50 mg daily.   Continue taking all of your other medications as you were before hospitalization.

## 2015-07-08 ENCOUNTER — Ambulatory Visit: Payer: Medicare Other | Admitting: Obstetrics and Gynecology

## 2015-07-13 ENCOUNTER — Ambulatory Visit (INDEPENDENT_AMBULATORY_CARE_PROVIDER_SITE_OTHER): Payer: Medicare Other | Admitting: Family Medicine

## 2015-07-13 ENCOUNTER — Encounter: Payer: Self-pay | Admitting: Family Medicine

## 2015-07-13 VITALS — BP 137/74 | HR 70 | Temp 98.9°F | Ht 71.0 in | Wt 155.0 lb

## 2015-07-13 DIAGNOSIS — I483 Typical atrial flutter: Secondary | ICD-10-CM | POA: Diagnosis not present

## 2015-07-13 DIAGNOSIS — K7031 Alcoholic cirrhosis of liver with ascites: Secondary | ICD-10-CM

## 2015-07-13 NOTE — Assessment & Plan Note (Signed)
Remains in irregular rhythm today with normal rate.  He has not taken amiodarone, Lasix or spironolactone since discharged one week ago due to having to wait for medications to arrive via mail.  He has these with him today.  He denies chest pain, palpitations, lightheadedness, shortness of breath.  Has follow-up with cardiology scheduled for 3/10 - Advised starting amiodarone 200 mg daily, keeping his scheduled follow-up with cardiology

## 2015-07-13 NOTE — Patient Instructions (Signed)
Your heart continues to have irregular heartbeat.  - Start amiodarone 200 mg once a day - Take Lasix 20 mg daily and spironolactone 50 mg daily for your fluid on your stomach  Make an appointment within the next 2-4 weeks to follow up with your primary care doctor for ascites

## 2015-07-13 NOTE — Progress Notes (Signed)
   Subjective:    Patient ID: Donald Moreno, male    DOB: [birth year], 75 y.o.   MRN: [id number]  Seen for hospital follow-up for   CC: A flutter  He denies any chest pain, palpitations, shortness of breath since being discharged from the hospital.  He reports his medications just came in the mail today, so he has not taken his amiodarone, Lasix, spironolactone, since his hospital discharge on 2/21.  He continues to report abdominal swelling, but reports this is no worse than when he left the hospital.  He reports he is scheduled to follow-up with cardiology on 3/10 and with nephrology on 3/6 as well as his liver specialist on 3/3.   Review of Systems   See HPI for ROS. Objective:  BP 137/74 mmHg  Pulse 70  Temp(Src) 98.9 F (37.2 C) (Oral)  Ht 5\' 11"  (1.803 m)  Wt 155 lb (70.308 kg)  BMI 21.63 kg/m2  General: NAD Cardiac: Irregular rate with normal rhythm, soft heart sounds, no murmurs. Respiratory: CTAB, normal effort Abdomen: Swollen and distended; nontender; large umbilical hernia Extremities: 1+ edema or cyanosis. WWP.    Assessment & Plan:   Typical atrial flutter (HCC) Remains in irregular rhythm today with normal rate.  He has not taken amiodarone, Lasix or spironolactone since discharged one week ago due to having to wait for medications to arrive via mail.  He has these with him today.  He denies chest pain, palpitations, lightheadedness, shortness of breath.  Has follow-up with cardiology scheduled for 3/10 - Advised starting amiodarone 200 mg daily, keeping his scheduled follow-up with cardiology  Hepatic cirrhosis ([place]) Distended abdomen without tenderness or signs of SBP.  Has not started Lasix or spironolactone but has is with him today - Recommend a starting Lasix 20 mg daily and spironolactone 50mg  qd - Recommend keep apt with gastroenterologist on 3/3 - F/u with PCP in 2-4 weeks

## 2015-07-13 NOTE — Assessment & Plan Note (Signed)
Distended abdomen without tenderness or signs of SBP.  Has not started Lasix or spironolactone but has is with him today - Recommend a starting Lasix 20 mg daily and spironolactone 50mg  qd - Recommend keep apt with gastroenterologist on 3/3 - F/u with PCP in 2-4 weeks

## 2015-07-14 ENCOUNTER — Other Ambulatory Visit: Payer: Self-pay | Admitting: Internal Medicine

## 2015-07-17 DIAGNOSIS — D649 Anemia, unspecified: Secondary | ICD-10-CM | POA: Diagnosis not present

## 2015-07-17 DIAGNOSIS — R188 Other ascites: Secondary | ICD-10-CM | POA: Diagnosis not present

## 2015-07-17 DIAGNOSIS — K7460 Unspecified cirrhosis of liver: Secondary | ICD-10-CM | POA: Diagnosis not present

## 2015-07-20 DIAGNOSIS — D631 Anemia in chronic kidney disease: Secondary | ICD-10-CM | POA: Diagnosis not present

## 2015-07-20 DIAGNOSIS — N183 Chronic kidney disease, stage 3 (moderate): Secondary | ICD-10-CM | POA: Diagnosis not present

## 2015-07-20 DIAGNOSIS — N2581 Secondary hyperparathyroidism of renal origin: Secondary | ICD-10-CM | POA: Diagnosis not present

## 2015-07-20 DIAGNOSIS — I129 Hypertensive chronic kidney disease with stage 1 through stage 4 chronic kidney disease, or unspecified chronic kidney disease: Secondary | ICD-10-CM | POA: Diagnosis not present

## 2015-07-23 ENCOUNTER — Telehealth: Payer: Self-pay | Admitting: Cardiovascular Disease

## 2015-07-23 ENCOUNTER — Other Ambulatory Visit (HOSPITAL_COMMUNITY): Payer: Self-pay | Admitting: Gastroenterology

## 2015-07-23 DIAGNOSIS — R188 Other ascites: Secondary | ICD-10-CM

## 2015-07-23 NOTE — Telephone Encounter (Signed)
Received records from [facility] for appointment on 07/24/15 with Dr Sallyanne Kuster.  Records given to [facility] (medical records) for Dr Croitoru's schedule on 07/24/15. lp

## 2015-07-24 ENCOUNTER — Ambulatory Visit (HOSPITAL_COMMUNITY)
Admission: RE | Admit: 2015-07-24 | Discharge: 2015-07-24 | Disposition: A | Discharge status: Home / Self Care | Payer: Medicare Other | Source: Ambulatory Visit | Attending: Gastroenterology | Admitting: Gastroenterology

## 2015-07-24 ENCOUNTER — Encounter: Payer: Self-pay | Admitting: Cardiovascular Disease

## 2015-07-24 ENCOUNTER — Other Ambulatory Visit (HOSPITAL_COMMUNITY): Payer: Self-pay | Admitting: Gastroenterology

## 2015-07-24 ENCOUNTER — Ambulatory Visit (INDEPENDENT_AMBULATORY_CARE_PROVIDER_SITE_OTHER): Payer: Medicare Other | Admitting: Cardiovascular Disease

## 2015-07-24 VITALS — BP 144/92 | HR 96 | Ht 70.5 in | Wt 164.8 lb

## 2015-07-24 DIAGNOSIS — N182 Chronic kidney disease, stage 2 (mild): Secondary | ICD-10-CM | POA: Diagnosis not present

## 2015-07-24 DIAGNOSIS — I483 Typical atrial flutter: Secondary | ICD-10-CM | POA: Diagnosis not present

## 2015-07-24 DIAGNOSIS — E43 Unspecified severe protein-calorie malnutrition: Secondary | ICD-10-CM

## 2015-07-24 DIAGNOSIS — R188 Other ascites: Secondary | ICD-10-CM | POA: Diagnosis not present

## 2015-07-24 DIAGNOSIS — K7460 Unspecified cirrhosis of liver: Secondary | ICD-10-CM | POA: Diagnosis not present

## 2015-07-24 DIAGNOSIS — K7031 Alcoholic cirrhosis of liver with ascites: Secondary | ICD-10-CM

## 2015-07-24 MED ORDER — ALBUMIN HUMAN 25 % IV SOLN
50.0000 g | Freq: Once | INTRAVENOUS | Status: AC
  Administered 2015-07-24: 50 g via INTRAVENOUS
  Filled 2015-07-24: qty 200

## 2015-07-24 MED ORDER — LIDOCAINE HCL (PF) 1 % IJ SOLN
INTRAMUSCULAR | Status: AC
  Filled 2015-07-24: qty 10

## 2015-07-24 NOTE — Procedures (Signed)
Successful US guided paracentesis from RLQ.  (6 Liter maximum ordered) Yielded 6 liters of clear yellow fluid.  No immediate complications.  Pt tolerated well.   Specimen was not sent for labs.  Jermiya Reichl S Mihira Tozzi PA-C 07/24/2015 4:26 PM

## 2015-07-24 NOTE — Patient Instructions (Signed)
Dr. Croitoru recommends that you schedule a follow-up appointment in: 3 MONTHS   

## 2015-07-24 NOTE — Progress Notes (Signed)
Patient ID: Donald Moreno, male   DOB: Mar 17, 1941, 75 y.o.   MRN: [id number]    Cardiology Office Note    Date:  07/24/2015   ID:  Donald Moreno, DOB 09-Jun-1940, MRN [id number]  PCP:  Luiz Blare, DO  Cardiologist:   Sanda Klein, MD   Chief Complaint  Patient presents with  . New Evaluation    swollen abdomen, legs,ankles,feet--prcedure to remove fluid today at [facility] long, not the first time.    History of Present Illness:  Donald Moreno is a 75 y.o. male with advanced liver cirrhosis with portal HTN and ascites, parenchymal insufficiency with spontaneously elevated prothrombin time, with recently diagnosed atrial flutter with RVR, requiring amiodarone for rate control due to hypotension.   He did not receive diuretics or amiodarone for about a week after hospital discharge, but has started these medications for the last week. He has gained 10 lb since discharge and has tense ascites.  His BP is now higher, but he is scheduled for high volume paracentesis later today. He has not had bleeding, syncope, change in dyspnea and is unaware of palpitations.    Past Medical History  Diagnosis Date  . Arthritis   . Gout   . Asthma   . Hypertension   . ETOH abuse     No past surgical history on file.  Outpatient Prescriptions Prior to Visit  Medication Sig Dispense Refill  . albuterol (PROAIR HFA) 108 (90 Base) MCG/ACT inhaler take 2 puffs every 4 hours if needed for wheezing or shortness of breath 8.5 g 5  . allopurinol (ZYLOPRIM) 300 MG tablet Take 1 tablet by mouth  daily 60 tablet 1  . amiodarone (PACERONE) 200 MG tablet Take 1 tablet by mouth  daily 30 tablet 1  . Cholecalciferol (VITAMIN D3) 1000 UNITS CAPS Take 1 capsule (1,000 Units total) by mouth daily. 60 capsule 1  . Emollient (CERAVE) LOTN Apply to dry skin daily. 1 Bottle 2  . ferrous sulfate 325 (65 FE) MG tablet Take 1 tablet (325 mg total) by mouth 2 (two) times daily with a meal. (Patient taking differently: Take  325 mg by mouth daily with breakfast. ) 60 tablet 3  . Fluticasone-Salmeterol (ADVAIR DISKUS) 500-50 MCG/DOSE AEPB Inhale 1 puff two times  daily 120 each 3  . furosemide (LASIX) 20 MG tablet Take 1 tablet by mouth  daily 30 tablet 1  . montelukast (SINGULAIR) 10 MG tablet Take 1 tablet (10 mg total) by mouth at bedtime. 90 tablet 6  . Nutritional Supplements (ENSURE COMPLETE SHAKE) LIQD Take 1 Container by mouth daily. 237 mL 11  . spironolactone (ALDACTONE) 50 MG tablet Take 1 tablet by mouth  daily 30 tablet 1   No facility-administered medications prior to visit.     Allergies:   Review of patient's allergies indicates no known allergies.   Social History   Social History  . Marital Status: Divorced    Spouse Name: N/A  . Number of Children: 5  . Years of Education: 10   Occupational History  . Retired- Administrator    Social History Main Topics  . Smoking status: Never Smoker   . Smokeless tobacco: Current User  . Alcohol Use: 1.2 oz/week    2 Cans of beer per week  . Drug Use: No  . Sexual Activity: Not Currently   Other Topics Concern  . None   Social History Narrative   Lives with his sister Stanton Kidney and nephew in [place] home.  Alcohol: 2 cans of beer daily    Tobacco: snuff all day (now 1/3 can per day). Never smoked.    Exercise: walks a to store a few times a week.       Other: dentures, walker (occasionally)      Will be visiting his son in [place] for his graduation in July 2012.          Hobbies:  TV, drink beer, visit with friends    Pets:  Dog, Bean     Family History:  The patient's family history includes Asthma in his brother and sister; Heart disease in his sister; Other in his father and mother.   ROS:   Please see the history of present illness.    ROS All other systems reviewed and are negative.   PHYSICAL EXAM:   VS:  BP 144/92 mmHg  Pulse 96  Ht 5' 10.5" (1.791 m)  Wt 74.753 kg (164 lb 12.8 oz)  BMI 23.30 kg/m2   GEN:  cachectic, well developed, in no acute distress HEENT: normal Neck: no JVD, carotid bruits, or masses Cardiac: irregular, no murmurs, rubs, or gallops,no edema  Respiratory:  clear to auscultation bilaterally, normal work of breathing GI: tense, nontender ascites, + BS MS: no deformity or atrophy Skin: warm and dry, no rash Neuro:  Alert and Oriented x 3, Strength and sensation are intact Psych: euthymic mood, full affect  Wt Readings from Last 3 Encounters:  07/24/15 74.753 kg (164 lb 12.8 oz)  07/13/15 70.308 kg (155 lb)  07/04/15 63.2 kg (139 lb 5.3 oz)      Studies/Labs Reviewed:   EKG:  EKG is ordered today.  The ekg ordered today demonstrates atrial flutter with variable AV block, ventricular rate 96 bpm, QTC 480 ms  Recent Labs: 07/01/2015: TSH 3.004 07/02/2015: Magnesium 2.0 07/05/2015: ALT 19; Hemoglobin 10.2*; Platelets 163 07/06/2015: BUN 45*; Creatinine, Ser 1.68*; Potassium 4.9; Sodium 137   Lipid Panel    Component Value Date/Time   CHOL 141 11/11/2011 1030   TRIG 128 11/11/2011 1030   HDL 71 11/11/2011 1030   CHOLHDL 2.0 11/11/2011 1030   VLDL 26 11/11/2011 1030   LDLCALC 44 11/11/2011 1030    Additional studies/ records that were reviewed today include:  Notes from Nephrology, Dr. Posey Pronto and Family Medicine, Dr. Berkley Harvey    ASSESSMENT:    1. Typical atrial flutter ([place])   2. Alcoholic cirrhosis of liver with ascites ([place])   3. Protein-calorie malnutrition, severe   4. CKD (chronic kidney disease), stage 2 (mild)      PLAN:  In order of problems listed above:  1. Atrial flutter: controlled ventricular rate, not a good candidate for anticoagulation or ablation. If his BP remains high enough after paracentesis, would try to gradually switch from amiodarone rate control to beta blockers (may benefit from nonselective beta blockers such as propranolol or nadolol) and reduce the likelihood of amiodarone related liver toxicity. 2. Cirrhosis: seems to have  severe disease, but not markers of rapid progression. He understands the severity of his liver disorder and again states that he is ready to die. 3. His BMI overestimates true nutritional status - probably 15-20 lb of ascites fluid. Albumin 2.8. 4. CKD:  Baseline creatinine appears to be 1.5-1.7.    Medication Adjustments/Labs and Tests Ordered: Current medicines are reviewed at length with the patient today.  Concerns regarding medicines are outlined above.  Medication changes, Labs and Tests ordered today are listed in the Patient Instructions  below. Patient Instructions  Dr. Sallyanne Kuster recommends that you schedule a follow-up appointment in: [place], [place], MD  07/24/2015 8:31 AM    [address], [place], [place]  [address] Phone: [phone]; Fax: [phone]

## 2015-07-27 ENCOUNTER — Other Ambulatory Visit: Payer: Self-pay | Admitting: Obstetrics and Gynecology

## 2015-07-29 ENCOUNTER — Encounter: Payer: Self-pay | Admitting: Obstetrics and Gynecology

## 2015-07-29 ENCOUNTER — Ambulatory Visit (INDEPENDENT_AMBULATORY_CARE_PROVIDER_SITE_OTHER): Payer: Medicare Other | Admitting: Obstetrics and Gynecology

## 2015-07-29 VITALS — BP 130/94 | HR 102 | Temp 98.2°F | Wt 156.0 lb

## 2015-07-29 DIAGNOSIS — I483 Typical atrial flutter: Secondary | ICD-10-CM | POA: Diagnosis not present

## 2015-07-29 DIAGNOSIS — Z7189 Other specified counseling: Secondary | ICD-10-CM | POA: Diagnosis not present

## 2015-07-29 DIAGNOSIS — K7031 Alcoholic cirrhosis of liver with ascites: Secondary | ICD-10-CM

## 2015-07-29 NOTE — Progress Notes (Signed)
    Subjective: Chief Complaint  Patient presents with  . Follow-up    ascites  - doing good    HPI: Donald Moreno is a 75 y.o. presenting to clinic today to discuss the following:  #Ascites -patient states he is feeling well -has some ascites but not causing any symtpoms currently -last had paracentesis on 3/10; with about 5L of fluid taken off -states theya re nto scheduled at this point and he only goes in when he is symtpomatic -symtpoms would include hurting in his ribs or back, feeling too full, dyspnea -goes to GI office to schedule -back on diuretics (Lasix and Spironlactone) to help with fluid retention  #Adv Directives: -patient with multiple Adv. Directives filled out -none in chart so patient to bring one to be scanned -very addiment about being a DNR  #Atrial flutter -now follows with a cardiologist -Denies any chest pain, palpitations, dyspnea, lightheadness -states he does not feel heart rate -believes this started because of too much fluid that came off after his last paracentesis  Non-smoker but everyday uses dip(smokeliess tobacco) - not ready to quit   ROS noted in HPI.  Past Medical, Surgical, Social, and Family History Reviewed & Updated per EMR.   Objective: BP 130/94 mmHg  Pulse 102  Temp(Src) 98.2 F (36.8 C) (Oral)  Wt 156 lb (70.761 kg) Vitals and nursing notes reviewed  Physical Exam  Constitutional: He is well-developed, well-nourished, and in no distress.  Cardiovascular: An irregular rhythm present. Tachycardia present.   Pulmonary/Chest: Effort normal and breath sounds normal. He has no rales.  Abdominal: He exhibits distension and ascites. Bowel sounds are hypoactive. There is no tenderness.  Musculoskeletal: He exhibits no edema.    Assessment/Plan: Please see problem based Assessment and [place], DO 07/29/2015, 9:02 AM PGY-2, [place]

## 2015-07-29 NOTE — Patient Instructions (Signed)
I am pleased to hear that things are going well for you.  Here are some of the things we discussed today: -We discussed your wishes for end of life care today. Will be noted in your chart -Continue to follow-up as needed for fluid removal -I will refill your medications as I get them   Please schedule a follow-up appointment with me in May.   Thanks for allowing me to be a part of your care! Dr. Gerarda Fraction

## 2015-08-01 ENCOUNTER — Encounter: Payer: Self-pay | Admitting: Obstetrics and Gynecology

## 2015-08-01 DIAGNOSIS — Z7189 Other specified counseling: Secondary | ICD-10-CM | POA: Insufficient documentation

## 2015-08-01 NOTE — Assessment & Plan Note (Signed)
Patient with multiple advance directives. Encouraged him to bring them in to scan into system. Patient is addiment about DNR code status since last hospitalization. States he is at peace and will be ready to go. Will get social worker at next visit to sit with patient and fill out MOST form.

## 2015-08-01 NOTE — Assessment & Plan Note (Signed)
Remains in irregular rhythm on today's visit. Asymptomatic. Being seen by cardiology. Current medications include amiodarone for rate control. No adjustments needed at today's visit. Not a good candidate for anticoagulation due to h/o GI bleed.

## 2015-08-01 NOTE — Assessment & Plan Note (Addendum)
Ascites present on exam. No signs of SBP. Fluid not causing any discomfort or symptoms. Patient aware of when to seek help for ascites. PRN paracentesis. Follows with GI. Back on Lasix and spironolactone for ascites/fluid retention. No longer drinking alcohol.

## 2015-08-07 ENCOUNTER — Other Ambulatory Visit (HOSPITAL_COMMUNITY): Payer: Self-pay | Admitting: Gastroenterology

## 2015-08-07 DIAGNOSIS — R188 Other ascites: Secondary | ICD-10-CM

## 2015-08-10 ENCOUNTER — Ambulatory Visit (HOSPITAL_COMMUNITY): Payer: Medicare Other

## 2015-08-11 ENCOUNTER — Ambulatory Visit (HOSPITAL_COMMUNITY)
Admission: RE | Admit: 2015-08-11 | Discharge: 2015-08-11 | Disposition: A | Discharge status: Home / Self Care | Payer: Medicare Other | Source: Ambulatory Visit | Attending: Gastroenterology | Admitting: Gastroenterology

## 2015-08-11 ENCOUNTER — Other Ambulatory Visit (HOSPITAL_COMMUNITY): Payer: Self-pay | Admitting: Gastroenterology

## 2015-08-11 DIAGNOSIS — R188 Other ascites: Secondary | ICD-10-CM | POA: Insufficient documentation

## 2015-08-11 MED ORDER — ALBUMIN HUMAN 25 % IV SOLN
50.0000 g | Freq: Once | INTRAVENOUS | Status: AC
  Administered 2015-08-11: 50 g via INTRAVENOUS
  Filled 2015-08-11 (×2): qty 200

## 2015-08-11 MED ORDER — LIDOCAINE HCL (PF) 1 % IJ SOLN
INTRAMUSCULAR | Status: AC
  Filled 2015-08-11: qty 10

## 2015-08-11 NOTE — Procedures (Signed)
   US guided RLQ paracentesis  6 liter maximum obtained Pt tolerated well  Post procedure IV Albumin per MD 50 gr

## 2015-08-24 ENCOUNTER — Other Ambulatory Visit (HOSPITAL_COMMUNITY): Payer: Self-pay | Admitting: Gastroenterology

## 2015-08-24 ENCOUNTER — Ambulatory Visit (HOSPITAL_COMMUNITY)
Admission: RE | Admit: 2015-08-24 | Discharge: 2015-08-24 | Disposition: A | Discharge status: Home / Self Care | Payer: Medicare Other | Source: Ambulatory Visit | Attending: Gastroenterology | Admitting: Gastroenterology

## 2015-08-24 DIAGNOSIS — R188 Other ascites: Secondary | ICD-10-CM

## 2015-08-24 MED ORDER — ALBUMIN HUMAN 25 % IV SOLN
50.0000 g | Freq: Once | INTRAVENOUS | Status: AC
  Administered 2015-08-24: 50 g via INTRAVENOUS
  Filled 2015-08-24: qty 200

## 2015-08-24 MED ORDER — LIDOCAINE HCL (PF) 1 % IJ SOLN
INTRAMUSCULAR | Status: AC
  Filled 2015-08-24: qty 10

## 2015-08-24 NOTE — Procedures (Signed)
   US guided RLQ paracentesis  6 liter maximum per MD 6 Liters yellow fluid removed  Pt tolerated well  Post procedure IV Albumin 50 gr per MD

## 2015-09-03 ENCOUNTER — Other Ambulatory Visit: Payer: Self-pay | Admitting: Obstetrics and Gynecology

## 2015-09-10 ENCOUNTER — Ambulatory Visit (HOSPITAL_COMMUNITY)
Admission: RE | Admit: 2015-09-10 | Discharge: 2015-09-10 | Disposition: A | Discharge status: Home / Self Care | Payer: Medicare Other | Source: Ambulatory Visit | Attending: Gastroenterology | Admitting: Gastroenterology

## 2015-09-10 DIAGNOSIS — R188 Other ascites: Secondary | ICD-10-CM | POA: Diagnosis not present

## 2015-09-10 MED ORDER — LIDOCAINE HCL (PF) 1 % IJ SOLN
INTRAMUSCULAR | Status: AC
  Filled 2015-09-10: qty 10

## 2015-09-10 MED ORDER — ALBUMIN HUMAN 25 % IV SOLN
50.0000 g | Freq: Once | INTRAVENOUS | Status: AC
  Administered 2015-09-10: 50 g via INTRAVENOUS

## 2015-09-10 MED ORDER — ALBUMIN HUMAN 25 % IV SOLN
50.0000 g | Freq: Once | INTRAVENOUS | Status: DC
  Filled 2015-09-10: qty 200

## 2015-09-10 NOTE — Procedures (Signed)
  US guided RLQ paracentesis  6 Liter maximum per MD Post procedure IV Albumin 50 gr per MD  Pt tolerated well

## 2015-09-23 ENCOUNTER — Encounter: Payer: Self-pay | Admitting: Obstetrics and Gynecology

## 2015-09-23 ENCOUNTER — Ambulatory Visit (INDEPENDENT_AMBULATORY_CARE_PROVIDER_SITE_OTHER): Payer: Medicare Other | Admitting: Obstetrics and Gynecology

## 2015-09-23 ENCOUNTER — Other Ambulatory Visit (HOSPITAL_COMMUNITY): Payer: Self-pay | Admitting: Gastroenterology

## 2015-09-23 VITALS — BP 138/90 | HR 109 | Temp 98.6°F | Ht 70.0 in | Wt 158.0 lb

## 2015-09-23 DIAGNOSIS — K7031 Alcoholic cirrhosis of liver with ascites: Secondary | ICD-10-CM | POA: Diagnosis not present

## 2015-09-23 DIAGNOSIS — J4530 Mild persistent asthma, uncomplicated: Secondary | ICD-10-CM

## 2015-09-23 DIAGNOSIS — R188 Other ascites: Secondary | ICD-10-CM

## 2015-09-23 DIAGNOSIS — I10 Essential (primary) hypertension: Secondary | ICD-10-CM | POA: Diagnosis not present

## 2015-09-23 MED ORDER — FLUTICASONE-SALMETEROL 500-50 MCG/DOSE IN AEPB: INHALATION_SPRAY | RESPIRATORY_TRACT | Status: DC

## 2015-09-23 MED ORDER — MONTELUKAST SODIUM 10 MG PO TABS: 10.0000 mg | ORAL_TABLET | Freq: Every day | ORAL | Status: DC

## 2015-09-23 NOTE — Assessment & Plan Note (Signed)
Intermittent flairs but stabilizes with medications. Patient did not want regimen change today. Has not required any hospitalization for asthma in past year. Refills given of medications.

## 2015-09-23 NOTE — Patient Instructions (Signed)
Glad you are doing well. You are up to date on all your screenings and blood work Follow-up with GI office to see when next paracentesis to remove fluid Medications refilled  Schedule an appointment as needed or come back and see me in about 3 months.

## 2015-09-23 NOTE — Progress Notes (Signed)
     Subjective: Chief Complaint  Patient presents with  . Follow-up    cirrhosis     HPI: Donald Moreno is a 75 y.o. presenting to clinic today to discuss the following:  #Cirrohisis/ascites -now has scheduled paracentesis every 2 weeks -next tap is tomorrow; last tap 4/27 -patient only gets no more than 6L drained  -h/o SBP before -currently symptomatic - no dyspnea, fevers, abdominal pain -compliant with medications - spironolactone and lasix  #Asthma -states well-controlled -worsens when fluid gets built up in abdomen -dyspnea worsens with exertion as well; patient states for the last few years has not been able to walk as far -Needs refill on medications -intermittenet wheezing    Health Maintenance: Up to date   ROS noted in HPI.  Past Medical, Surgical, Social, and Family History Reviewed & Updated per EMR.   Objective: BP 138/90 mmHg  Pulse 109  Temp(Src) 98.6 F (37 C) (Oral)  Ht 5\' 10"  (1.778 m)  Wt 158 lb (71.668 kg)  BMI 22.67 kg/m2 Vitals and nursing notes reviewed  Physical Exam  Constitutional: He is well-developed, well-nourished, and in no distress.  Cardiovascular: Regular rhythm, normal heart sounds and normal pulses.  Tachycardia present.   Pulmonary/Chest: Effort normal and breath sounds normal. He has no wheezes.  Abdominal: He exhibits distension, fluid wave and ascites. There is no tenderness.    Assessment/Plan: Please see problem based Assessment and Plan   Meds ordered this encounter  Medications  . montelukast (SINGULAIR) 10 MG tablet    Sig: Take 1 tablet (10 mg total) by mouth at bedtime.    Dispense:  90 tablet    Refill:  6  . Fluticasone-Salmeterol (ADVAIR DISKUS) 500-50 MCG/DOSE AEPB    Sig: Inhale 1 puff two times  daily    Dispense:  120 each    Refill:  [place], DO 09/23/2015, 8:28 AM PGY-2, [place]

## 2015-09-23 NOTE — Assessment & Plan Note (Signed)
Has chronic ascites. No signs of SBP. Well-appearing and afebrile. Has scheduled paracentesis every two weeks. Patient to continue antidiuretic medications to help with ascites. No changes at this time.

## 2015-09-23 NOTE — Assessment & Plan Note (Addendum)
BP appropriate today. Continue regimen. Takes amiodarone for HR control and Lasix and Spironolactone for cirrhosis. Has not had anymore hypotensive episodes.

## 2015-09-24 ENCOUNTER — Ambulatory Visit (HOSPITAL_COMMUNITY)
Admission: RE | Admit: 2015-09-24 | Discharge: 2015-09-24 | Disposition: A | Discharge status: Home / Self Care | Payer: Medicare Other | Source: Ambulatory Visit | Attending: Gastroenterology | Admitting: Gastroenterology

## 2015-09-24 DIAGNOSIS — R188 Other ascites: Secondary | ICD-10-CM | POA: Diagnosis not present

## 2015-09-24 DIAGNOSIS — K7460 Unspecified cirrhosis of liver: Secondary | ICD-10-CM | POA: Diagnosis not present

## 2015-09-24 MED ORDER — ALBUMIN HUMAN 25 % IV SOLN
50.0000 g | Freq: Once | INTRAVENOUS | Status: AC
  Administered 2015-09-24: 50 g via INTRAVENOUS
  Filled 2015-09-24: qty 200

## 2015-09-24 MED ORDER — LIDOCAINE HCL (PF) 1 % IJ SOLN
INTRAMUSCULAR | Status: AC
  Filled 2015-09-24: qty 10

## 2015-09-24 NOTE — Procedures (Signed)
Ultrasound-guided therapeutic paracentesis performed yielding 6 liters (which was his max) of yellow colored fluid. No immediate complications.  Seena Ritacco E 2:01 PM 09/24/2015

## 2015-10-08 ENCOUNTER — Other Ambulatory Visit (HOSPITAL_COMMUNITY): Payer: Self-pay | Admitting: Gastroenterology

## 2015-10-08 DIAGNOSIS — R188 Other ascites: Secondary | ICD-10-CM

## 2015-10-09 ENCOUNTER — Ambulatory Visit (HOSPITAL_COMMUNITY)
Admission: RE | Admit: 2015-10-09 | Discharge: 2015-10-09 | Disposition: A | Discharge status: Home / Self Care | Payer: Medicare Other | Source: Ambulatory Visit | Attending: Gastroenterology | Admitting: Gastroenterology

## 2015-10-09 ENCOUNTER — Encounter (HOSPITAL_COMMUNITY): Payer: Self-pay

## 2015-10-09 DIAGNOSIS — R188 Other ascites: Secondary | ICD-10-CM | POA: Insufficient documentation

## 2015-10-09 DIAGNOSIS — K7460 Unspecified cirrhosis of liver: Secondary | ICD-10-CM | POA: Insufficient documentation

## 2015-10-09 DIAGNOSIS — K7031 Alcoholic cirrhosis of liver with ascites: Secondary | ICD-10-CM | POA: Diagnosis not present

## 2015-10-09 MED ORDER — ALBUMIN HUMAN 25 % IV SOLN
50.0000 g | Freq: Once | INTRAVENOUS | Status: AC
  Administered 2015-10-09: 50 g via INTRAVENOUS

## 2015-10-09 MED ORDER — ALBUMIN HUMAN 25 % IV SOLN
INTRAVENOUS | Status: AC
  Administered 2015-10-09: 50 g via INTRAVENOUS
  Filled 2015-10-09: qty 200

## 2015-10-09 NOTE — Progress Notes (Signed)
Albumin 50G infusion complete. Tolerated well. Patient with 6 liters yellow ascites removed from abdomen.

## 2015-10-09 NOTE — Procedures (Signed)
PreOperative Dx: Alcoholic cirrhosis, ascites Postoperative Dx: Alcoholic cirrhosis, ascites Procedure:   US guided paracentesis Radiologist:  Thornton Papas Anesthesia:  10 ml of1% lidocaine Specimen:  6000 ml of yellow ascitic fluid, requested maximum draw EBL:   < 1 ml Complications: None

## 2015-10-09 NOTE — Discharge Instructions (Addendum)
Paracentesis Paracentesis is a procedure to remove excess fluid (ascites) from the belly (abdomen). Ascites can result from certain conditions, such as infection, inflammation, abdominal injury, heart failure, chronic scarring of the liver (cirrhosis), or cancer. Ascites is removed using a needle that is inserted through the skin and tissue into the abdomen. This procedure may be done:  To determine the cause of the ascites.  To relieve symptoms that are caused by the ascites, such as pain or shortness of breath.  To see if there is bleeding after an abdominal injury. LET [facility] CARE PROVIDER KNOW ABOUT:  Any allergies you have.  All medicines you are taking, including vitamins, herbs, eye drops, creams, and over-the-counter medicines.  Previous problems you or members of your family have had with the use of anesthetics.  Any blood disorders you have.  Previous surgeries you have had.  Any medical conditions you have.  Whether you are pregnant or may be pregnant. RISKS AND COMPLICATIONS Generally, this is a safe procedure. However, problems may occur, including:  Infection.  Bleeding.  Injury to an abdominal organ, such as the bowel (large intestine), liver, spleen, or bladder.  Low blood pressure (hypotension).  Spreading of cancer, if there are cancer cells in the abdominal fluid.  Mental status changes in people who have liver disease. These changes would be caused by shifts in the balance of fluids and minerals (electrolytes) in the body. BEFORE THE PROCEDURE  Ask your health care provider about:  Changing or stopping your regular medicines. This is especially important if you are taking diabetes medicines or blood thinners.  Taking medicines such as aspirin and ibuprofen. These medicines can thin your blood. Do not take these medicines before your procedure if your health care provider instructs you not to.  A blood sample may be done to determine your blood  clotting time.  You will be asked to urinate. PROCEDURE  You may be asked to lie on your back with your head raised (elevated).  To reduce your risk of infection:  Your health care team will wash or sanitize their hands.  Your skin will be washed with soap.  You will be given a medicine to numb the area (local anesthetic).  Your abdominal skin will be punctured with a needle or a scalpel.  A drainage tube will be inserted through the puncture site. Fluid will drain through the tube into a container.  After enough fluid has been removed, the tube will be removed.  A sample of the fluid will be sent for examination.  A bandage (dressing) will be placed over the puncture site. The procedure may vary among health care providers and hospitals. AFTER THE PROCEDURE  It is your responsibility to get your test results. Ask your health care provider or the department performing the test when your results will be ready.   This information is not intended to replace advice given to you by your health care provider. Make sure you discuss any questions you have with your health care provider.   Document Released: 11/15/2004 Document Revised: 01/21/2015 Document Reviewed: 07/15/2014 Elsevier Interactive Patient Education [employer].

## 2015-10-16 ENCOUNTER — Other Ambulatory Visit (HOSPITAL_COMMUNITY): Payer: Self-pay | Admitting: Gastroenterology

## 2015-10-16 DIAGNOSIS — K7460 Unspecified cirrhosis of liver: Secondary | ICD-10-CM

## 2015-10-16 DIAGNOSIS — R188 Other ascites: Secondary | ICD-10-CM

## 2015-10-21 ENCOUNTER — Ambulatory Visit (HOSPITAL_COMMUNITY)
Admission: RE | Admit: 2015-10-21 | Discharge: 2015-10-21 | Disposition: A | Discharge status: Home / Self Care | Payer: Medicare Other | Source: Ambulatory Visit | Attending: Gastroenterology | Admitting: Gastroenterology

## 2015-10-21 ENCOUNTER — Encounter (HOSPITAL_COMMUNITY): Payer: Self-pay

## 2015-10-21 DIAGNOSIS — R188 Other ascites: Secondary | ICD-10-CM

## 2015-10-21 DIAGNOSIS — D649 Anemia, unspecified: Secondary | ICD-10-CM | POA: Insufficient documentation

## 2015-10-21 DIAGNOSIS — K7460 Unspecified cirrhosis of liver: Secondary | ICD-10-CM

## 2015-10-21 DIAGNOSIS — I8500 Esophageal varices without bleeding: Secondary | ICD-10-CM | POA: Diagnosis not present

## 2015-10-21 DIAGNOSIS — K769 Liver disease, unspecified: Secondary | ICD-10-CM | POA: Insufficient documentation

## 2015-10-21 MED ORDER — ALBUMIN HUMAN 25 % IV SOLN
50.0000 g | Freq: Once | INTRAVENOUS | Status: AC
  Administered 2015-10-21: 50 g via INTRAVENOUS
  Filled 2015-10-21: qty 200

## 2015-10-21 MED ORDER — SODIUM CHLORIDE 0.9 % IV SOLN
INTRAVENOUS | Status: DC
  Administered 2015-10-21: 11:00:00 via INTRAVENOUS

## 2015-10-21 NOTE — Procedures (Signed)
Ultrasound-guided therapeutic paracentesis performed yielding 5.9 liters of slightly hazy, yellow fluid. No immediate complications.The pt will receive IV albumin postprocedure.

## 2015-10-21 NOTE — Discharge Instructions (Signed)
Albumin injection °What is this medicine? °ALBUMIN (al BYOO min) is used to treat or prevent shock following serious injury, bleeding, surgery, or burns by increasing the volume of blood plasma. This medicine can also replace low blood protein. °This medicine may be used for other purposes; ask your health care provider or pharmacist if you have questions. °What should I tell my health care provider before I take this medicine? °They need to know if you have any of the following conditions: °-anemia °-heart disease °-kidney disease °-an unusual or allergic reaction to albumin, other medicines, foods, dyes, or preservatives °-pregnant or trying to get pregnant °-breast-feeding °How should I use this medicine? °This medicine is for infusion into a vein. It is given by a health-care professional in a hospital or clinic. °Talk to your pediatrician regarding the use of this medicine in children. While this drug may be prescribed for selected conditions, precautions do apply. °Overdosage: If you think you have taken too much of this medicine contact a poison control center or emergency room at once. °NOTE: This medicine is only for you. Do not share this medicine with others. °What if I miss a dose? °This does not apply. °What may interact with this medicine? °Interactions are not expected. °This list may not describe all possible interactions. Give your health care provider a list of all the medicines, herbs, non-prescription drugs, or dietary supplements you use. Also tell them if you smoke, drink alcohol, or use illegal drugs. Some items may interact with your medicine. °What should I watch for while using this medicine? °Your condition will be closely monitored while you receive this medicine. °Some products are derived from human plasma, and there is a small risk that these products may contain certain types of virus or bacteria. All products are processed to kill most viruses and bacteria. If you have questions  concerning the risk of infections, discuss them with your doctor or health care professional. °What side effects may I notice from receiving this medicine? °Side effects that you should report to your doctor or health care professional as soon as possible: °-allergic reactions like skin rash, itching or hives, swelling of the face, lips, or tongue °-breathing problems °-changes in heartbeat °-fever, chills °-pain, redness or swelling at the injection site °-signs of viral infection including fever, drowsiness, chills, runny nose followed in about 2 weeks by a rash and joint pain °-tightness in the chest °Side effects that usually do not require medical attention (report to your doctor or health care professional if they continue or are bothersome): °-increased salivation °-nausea, vomiting °This list may not describe all possible side effects. Call your doctor for medical advice about side effects. You may report side effects to FDA at 1-800-FDA-1088. °Where should I keep my medicine? °This does not apply. You will not be given this medicine to store at home. °NOTE: This sheet is a summary. It may not cover all possible information. If you have questions about this medicine, talk to your doctor, pharmacist, or health care provider. °  °© 2016, Elsevier/Gold Standard. (2007-07-26 10:18:55) ° °

## 2015-10-23 ENCOUNTER — Other Ambulatory Visit (HOSPITAL_COMMUNITY): Payer: Self-pay | Admitting: Gastroenterology

## 2015-10-23 DIAGNOSIS — I8500 Esophageal varices without bleeding: Secondary | ICD-10-CM | POA: Diagnosis not present

## 2015-10-23 DIAGNOSIS — R188 Other ascites: Secondary | ICD-10-CM

## 2015-10-23 DIAGNOSIS — K7460 Unspecified cirrhosis of liver: Secondary | ICD-10-CM

## 2015-10-23 DIAGNOSIS — K769 Liver disease, unspecified: Secondary | ICD-10-CM

## 2015-10-28 ENCOUNTER — Ambulatory Visit (INDEPENDENT_AMBULATORY_CARE_PROVIDER_SITE_OTHER): Payer: Medicare Other | Admitting: Cardiovascular Disease

## 2015-10-28 ENCOUNTER — Encounter: Payer: Self-pay | Admitting: Cardiovascular Disease

## 2015-10-28 VITALS — BP 118/80 | HR 86 | Ht 70.0 in | Wt 151.2 lb

## 2015-10-28 DIAGNOSIS — K7031 Alcoholic cirrhosis of liver with ascites: Secondary | ICD-10-CM | POA: Diagnosis not present

## 2015-10-28 DIAGNOSIS — N183 Chronic kidney disease, stage 3 unspecified: Secondary | ICD-10-CM

## 2015-10-28 DIAGNOSIS — I483 Typical atrial flutter: Secondary | ICD-10-CM | POA: Diagnosis not present

## 2015-10-28 DIAGNOSIS — E43 Unspecified severe protein-calorie malnutrition: Secondary | ICD-10-CM

## 2015-10-28 MED ORDER — AMIODARONE HCL 200 MG PO TABS: 200.0000 mg | ORAL_TABLET | ORAL | Status: DC

## 2015-10-28 NOTE — Patient Instructions (Signed)
Dr Sallyanne Kuster has recommended making the following medication changes: 1. DECREASE Amiodarone to 200 mg FOUR times weekly - Tuesdays, Thursdays, Saturdays, and Sundays  Dr Sallyanne Kuster recommends that you schedule a follow-up appointment in 3 months.  If you need a refill on your cardiac medications before your next appointment, please call your pharmacy.

## 2015-10-28 NOTE — Progress Notes (Signed)
Patient ID: Donald Moreno, male   DOB: March 31, 1941, 75 y.o.   MRN: [id number]    Cardiology Office Note    Date:  10/28/2015   ID:  Donald Moreno, DOB 1940/06/15, MRN [id number]  PCP:  Luiz Blare, DO  Cardiologist:   Sanda Klein, MD   Chief Complaint  Patient presents with  . Follow-up    patient reports no complaints    History of Present Illness:  Donald Moreno is a 75 y.o. male with advanced liver cirrhosis with portal HTN and ascites, parenchymal insufficiency with spontaneously elevated prothrombin time, with recently diagnosed atrial flutter with RVR, requiring amiodarone for rate control due to hypotension.   He requires high volume paracentesis periodically, about once every 2 weeks. His next procedure is scheduled for June 22.  He has not had bleeding, syncope, change in exertional dyspnea (class 2) and is unaware of palpitations. He has not had overt encephalopathy or any focal neurological events. He maintains his usual positive and laid-back attitude, despite his advanced liver disease.     Past Medical History  Diagnosis Date  . Arthritis   . Gout   . Asthma   . Hypertension   . ETOH abuse   . Spontaneous bacterial peritonitis ([place]) 07/01/2015    No past surgical history on file.  Outpatient Prescriptions Prior to Visit  Medication Sig Dispense Refill  . albuterol (PROAIR HFA) 108 (90 Base) MCG/ACT inhaler take 2 puffs every 4 hours if needed for wheezing or shortness of breath 8.5 g 5  . allopurinol (ZYLOPRIM) 300 MG tablet Take 1 tablet by mouth  daily 90 tablet 1  . Cholecalciferol (VITAMIN D3) 1000 UNITS CAPS Take 1 capsule (1,000 Units total) by mouth daily. 60 capsule 1  . ferrous sulfate 325 (65 FE) MG tablet Take 1 tablet (325 mg total) by mouth 2 (two) times daily with a meal. (Patient taking differently: Take 325 mg by mouth daily with breakfast. ) 60 tablet 3  . Fluticasone-Salmeterol (ADVAIR DISKUS) 500-50 MCG/DOSE AEPB Inhale 1 puff two times   daily 120 each 3  . furosemide (LASIX) 20 MG tablet Take 1 tablet by mouth  daily 60 tablet 5  . montelukast (SINGULAIR) 10 MG tablet Take 1 tablet (10 mg total) by mouth at bedtime. 90 tablet 6  . Nutritional Supplements (ENSURE COMPLETE SHAKE) LIQD Take 1 Container by mouth daily. 237 mL 11  . spironolactone (ALDACTONE) 50 MG tablet Take 1 tablet by mouth  daily 60 tablet 5  . amiodarone (PACERONE) 200 MG tablet Take 1 tablet by mouth  daily 60 tablet 5  . Emollient (CERAVE) LOTN Apply to dry skin daily. (Patient not taking: Reported on 10/28/2015) 1 Bottle 2   No facility-administered medications prior to visit.     Allergies:   Review of patient's allergies indicates no known allergies.   Social History   Social History  . Marital Status: Divorced    Spouse Name: N/A  . Number of Children: 5  . Years of Education: 10   Occupational History  . Retired- Administrator    Social History Main Topics  . Smoking status: Never Smoker   . Smokeless tobacco: Current User  . Alcohol Use: 1.2 oz/week    2 Cans of beer per week  . Drug Use: No  . Sexual Activity: Not Currently   Other Topics Concern  . None   Social History Narrative   Lives with his sister Donald Moreno and nephew in [place] home.  Alcohol: 2 cans of beer daily    Tobacco: snuff all day (now 1/3 can per day). Never smoked.    Exercise: walks a to store a few times a week.       Other: dentures, walker (occasionally)      Will be visiting his son in [place] for his graduation in July 2012.          Hobbies:  TV, drink beer, visit with friends    Pets:  Dog, Bean     Family History:  The patient's family history includes Asthma in his brother and sister; Heart disease in his sister; Other in his father and mother.   ROS:   Please see the history of present illness.    ROS All other systems reviewed and are negative.   PHYSICAL EXAM:   VS:  BP 118/80 mmHg  Pulse 86  Ht 5\' 10"  (1.778 m)  Wt 68.584 kg (151  lb 3.2 oz)  BMI 21.69 kg/m2   GEN: cachectic, well developed, in no acute distress HEENT: normal Neck: no JVD, carotid bruits, or masses Cardiac: irregular, no murmurs, rubs, or gallops,no edema  Respiratory:  clear to auscultation bilaterally, normal work of breathing GI: tense, nontender ascites, + BS MS: no deformity or atrophy Skin: warm and dry, no rash Neuro:  Alert and Oriented x 3, Strength and sensation are intact Psych: euthymic mood, full affect  Wt Readings from Last 3 Encounters:  10/28/15 68.584 kg (151 lb 3.2 oz)  09/23/15 71.668 kg (158 lb)  09/10/15 70.308 kg (155 lb)      Studies/Labs Reviewed:   EKG:  EKG is ordered today.  The ekg ordered today demonstrates atrial flutter with variable AV block, ventricular rate 86 bpm, QTC 459 ms  Recent Labs: 07/01/2015: TSH 3.004 07/02/2015: Magnesium 2.0 07/05/2015: ALT 19; Hemoglobin 10.2*; Platelets 163 07/06/2015: BUN 45*; Creatinine, Ser 1.68*; Potassium 4.9; Sodium 137   Lipid Panel    Component Value Date/Time   CHOL 141 11/11/2011 1030   TRIG 128 11/11/2011 1030   HDL 71 11/11/2011 1030   CHOLHDL 2.0 11/11/2011 1030   VLDL 26 11/11/2011 1030   LDLCALC 44 11/11/2011 1030    Additional studies/ records that were reviewed today include:  Notes from Nephrology, Dr. Posey Pronto and Family Medicine, Dr. Berkley Harvey    ASSESSMENT:    1. Typical atrial flutter ([place])   2. Alcoholic cirrhosis of liver with ascites ([place])   3. Protein-calorie malnutrition, severe   4. CKD (chronic Moreno disease), stage III      PLAN:  In order of problems listed above:  1. Atrial flutter: controlled ventricular rate, not a good candidate for anticoagulation or ablation.We'll try to use the lowest possible dose of amiodarone due to the risk of additional hepatotoxicity. He will take amiodarone 200 mg only 4 days a week. It seems that his blood pressure is a little higher. If this holds true to his next appointment, would try to gradually  switch from amiodarone rate control to beta blockers (may benefit from nonselective beta blockers such as propranolol or nadolol) and reduce the likelihood of amiodarone related liver toxicity. 2. Cirrhosis: seems to have severe disease, but not markers of rapid progression. He understands the severity of his liver disorder and again states that he is prepared for his demise whenever this may occur. 3. His BMI overestimates true nutritional status - probably 15-20 lb of ascites fluid.. 4. CKD:  Baseline creatinine appears to be 1.5-1.7.  Medication Adjustments/Labs and Tests Ordered: Current medicines are reviewed at length with the patient today.  Concerns regarding medicines are outlined above.  Medication changes, Labs and Tests ordered today are listed in the Patient Instructions below. Patient Instructions  Dr Sallyanne Kuster has recommended making the following medication changes: 1. DECREASE Amiodarone to 200 mg FOUR times weekly - Tuesdays, Thursdays, Saturdays, and Sundays  Dr Sallyanne Kuster recommends that you schedule a follow-up appointment in 3 months.  If you need a refill on your cardiac medications before your next appointment, please call your pharmacy.       Signed, Sanda Klein, MD  10/28/2015 12:15 PM    [address], [place], [place]  [address] Phone: [phone]; Fax: [phone]

## 2015-11-05 ENCOUNTER — Encounter (HOSPITAL_COMMUNITY): Payer: Self-pay

## 2015-11-05 ENCOUNTER — Ambulatory Visit (HOSPITAL_COMMUNITY)
Admission: RE | Admit: 2015-11-05 | Discharge: 2015-11-05 | Disposition: A | Discharge status: Home / Self Care | Payer: Medicare Other | Source: Ambulatory Visit | Attending: Gastroenterology | Admitting: Gastroenterology

## 2015-11-05 DIAGNOSIS — K769 Liver disease, unspecified: Secondary | ICD-10-CM

## 2015-11-05 DIAGNOSIS — K7460 Unspecified cirrhosis of liver: Secondary | ICD-10-CM

## 2015-11-05 DIAGNOSIS — D649 Anemia, unspecified: Secondary | ICD-10-CM | POA: Diagnosis not present

## 2015-11-05 DIAGNOSIS — I8500 Esophageal varices without bleeding: Secondary | ICD-10-CM | POA: Diagnosis not present

## 2015-11-05 DIAGNOSIS — R935 Abnormal findings on diagnostic imaging of other abdominal regions, including retroperitoneum: Secondary | ICD-10-CM | POA: Insufficient documentation

## 2015-11-05 DIAGNOSIS — R188 Other ascites: Secondary | ICD-10-CM | POA: Diagnosis not present

## 2015-11-05 DIAGNOSIS — K7031 Alcoholic cirrhosis of liver with ascites: Secondary | ICD-10-CM | POA: Diagnosis not present

## 2015-11-05 MED ORDER — ALBUMIN HUMAN 25 % IV SOLN
50.0000 g | Freq: Once | INTRAVENOUS | Status: AC
  Administered 2015-11-05: 50 g via INTRAVENOUS

## 2015-11-05 MED ORDER — ALBUMIN HUMAN 25 % IV SOLN
INTRAVENOUS | Status: AC
  Filled 2015-11-05: qty 200

## 2015-11-05 NOTE — Procedures (Signed)
PreOperative Dx: Alcoholic cirrhosis, stage III chronic kidney disease, ascites Postoperative Dx: Alcoholic cirrhosis, stage III chronic kidney disease, ascites Procedure:   US guided paracentesis Radiologist:  Thornton Papas Anesthesia:  10 ml of1% lidocaine Specimen:  6.0 L of yellow ascitic fluid EBL:   < 1 ml Complications: None

## 2015-11-05 NOTE — Progress Notes (Signed)
Paracentesis complete no signs of distress. 6000 ml yellow colored ascites removed.

## 2015-11-16 DIAGNOSIS — I129 Hypertensive chronic kidney disease with stage 1 through stage 4 chronic kidney disease, or unspecified chronic kidney disease: Secondary | ICD-10-CM | POA: Diagnosis not present

## 2015-11-16 DIAGNOSIS — N183 Chronic kidney disease, stage 3 (moderate): Secondary | ICD-10-CM | POA: Diagnosis not present

## 2015-11-16 DIAGNOSIS — N2581 Secondary hyperparathyroidism of renal origin: Secondary | ICD-10-CM | POA: Diagnosis not present

## 2015-11-16 DIAGNOSIS — D631 Anemia in chronic kidney disease: Secondary | ICD-10-CM | POA: Diagnosis not present

## 2015-11-18 ENCOUNTER — Other Ambulatory Visit (HOSPITAL_COMMUNITY): Payer: Self-pay | Admitting: Gastroenterology

## 2015-11-18 DIAGNOSIS — R188 Other ascites: Secondary | ICD-10-CM

## 2015-11-20 ENCOUNTER — Ambulatory Visit (HOSPITAL_COMMUNITY)
Admission: RE | Admit: 2015-11-20 | Discharge: 2015-11-20 | Disposition: A | Discharge status: Home / Self Care | Payer: Medicare Other | Source: Ambulatory Visit | Attending: Gastroenterology | Admitting: Gastroenterology

## 2015-11-20 ENCOUNTER — Encounter (HOSPITAL_COMMUNITY): Payer: Self-pay

## 2015-11-20 DIAGNOSIS — K7460 Unspecified cirrhosis of liver: Secondary | ICD-10-CM | POA: Diagnosis not present

## 2015-11-20 DIAGNOSIS — R188 Other ascites: Secondary | ICD-10-CM | POA: Insufficient documentation

## 2015-11-20 MED ORDER — ALBUMIN HUMAN 25 % IV SOLN
50.0000 g | Freq: Once | INTRAVENOUS | Status: AC
  Administered 2015-11-20: 50 g via INTRAVENOUS
  Filled 2015-11-20: qty 200

## 2015-11-20 MED ORDER — SODIUM CHLORIDE 0.9 % IV SOLN
Freq: Once | INTRAVENOUS | Status: AC
  Administered 2015-11-20: 11:00:00 via INTRAVENOUS

## 2015-11-20 NOTE — Procedures (Signed)
Ultrasound-guided  therapeutic paracentesis performed yielding 5 liters (maximum ordered) of yellow  fluid. No immediate complications. The patient will receive IV albumin infusion post procedure.

## 2015-11-20 NOTE — Discharge Instructions (Signed)
Albumin injection °What is this medicine? °ALBUMIN (al BYOO min) is used to treat or prevent shock following serious injury, bleeding, surgery, or burns by increasing the volume of blood plasma. This medicine can also replace low blood protein. °This medicine may be used for other purposes; ask your health care provider or pharmacist if you have questions. °What should I tell my health care provider before I take this medicine? °They need to know if you have any of the following conditions: °-anemia °-heart disease °-kidney disease °-an unusual or allergic reaction to albumin, other medicines, foods, dyes, or preservatives °-pregnant or trying to get pregnant °-breast-feeding °How should I use this medicine? °This medicine is for infusion into a vein. It is given by a health-care professional in a hospital or clinic. °Talk to your pediatrician regarding the use of this medicine in children. While this drug may be prescribed for selected conditions, precautions do apply. °Overdosage: If you think you have taken too much of this medicine contact a poison control center or emergency room at once. °NOTE: This medicine is only for you. Do not share this medicine with others. °What if I miss a dose? °This does not apply. °What may interact with this medicine? °Interactions are not expected. °This list may not describe all possible interactions. Give your health care provider a list of all the medicines, herbs, non-prescription drugs, or dietary supplements you use. Also tell them if you smoke, drink alcohol, or use illegal drugs. Some items may interact with your medicine. °What should I watch for while using this medicine? °Your condition will be closely monitored while you receive this medicine. °Some products are derived from human plasma, and there is a small risk that these products may contain certain types of virus or bacteria. All products are processed to kill most viruses and bacteria. If you have questions  concerning the risk of infections, discuss them with your doctor or health care professional. °What side effects may I notice from receiving this medicine? °Side effects that you should report to your doctor or health care professional as soon as possible: °-allergic reactions like skin rash, itching or hives, swelling of the face, lips, or tongue °-breathing problems °-changes in heartbeat °-fever, chills °-pain, redness or swelling at the injection site °-signs of viral infection including fever, drowsiness, chills, runny nose followed in about 2 weeks by a rash and joint pain °-tightness in the chest °Side effects that usually do not require medical attention (report to your doctor or health care professional if they continue or are bothersome): °-increased salivation °-nausea, vomiting °This list may not describe all possible side effects. Call your doctor for medical advice about side effects. You may report side effects to FDA at 1-800-FDA-1088. °Where should I keep my medicine? °This does not apply. You will not be given this medicine to store at home. °NOTE: This sheet is a summary. It may not cover all possible information. If you have questions about this medicine, talk to your doctor, pharmacist, or health care provider. °  °© 2016, [employer]vier/[employer]. (2007-07-26 10:18:55) °Paracentesis °Paracentesis is a procedure to remove excess fluid (ascites) from the belly (abdomen). Ascites can result from certain conditions, such as infection, inflammation, abdominal injury, heart failure, chronic scarring of the liver (cirrhosis), or cancer. Ascites is removed using a needle that is inserted through the skin and tissue into the abdomen. °This procedure may be done: °· To determine the cause of the ascites. °· To relieve symptoms that are caused   by the ascites, such as pain or shortness of breath. °· To see if there is bleeding after an abdominal injury. °LET YOUR HEALTH CARE PROVIDER KNOW ABOUT: °· Any  allergies you have. °· All medicines you are taking, including vitamins, herbs, eye drops, creams, and over-the-counter medicines. °· Previous problems you or members of your family have had with the use of anesthetics. °· Any blood disorders you have. °· Previous surgeries you have had. °· Any medical conditions you have. °· Whether you are pregnant or may be pregnant. °RISKS AND COMPLICATIONS °Generally, this is a safe procedure. However, problems may occur, including: °· Infection. °· Bleeding. °· Injury to an abdominal organ, such as the bowel (large intestine), liver, spleen, or bladder. °· Low blood pressure (hypotension). °· Spreading of cancer, if there are cancer cells in the abdominal fluid. °· Mental status changes in people who have liver disease. These changes would be caused by shifts in the balance of fluids and minerals (electrolytes) in the body. °BEFORE THE PROCEDURE °· Ask your health care provider about: °¨ Changing or stopping your regular medicines. This is especially important if you are taking diabetes medicines or blood thinners. °¨ Taking medicines such as aspirin and ibuprofen. These medicines can thin your blood. Do not take these medicines before your procedure if your health care provider instructs you not to. °· A blood sample may be done to determine your blood clotting time. °· You will be asked to urinate. °PROCEDURE °· You may be asked to lie on your back with your head raised (elevated). °· To reduce your risk of infection: °¨ Your health care team will wash or sanitize their hands. °¨ Your skin will be washed with soap. °· You will be given a medicine to numb the area (local anesthetic). °· Your abdominal skin will be punctured with a needle or a scalpel. °· A drainage tube will be inserted through the puncture site. Fluid will drain through the tube into a container. °· After enough fluid has been removed, the tube will be removed. °· A sample of the fluid will be sent for  examination. °· A bandage (dressing) will be placed over the puncture site. °The procedure may vary among health care providers and hospitals. °AFTER THE PROCEDURE °· It is your responsibility to get your test results. Ask your health care provider or the department performing the test when your results will be ready. °  °This information is not intended to replace advice given to you by your health care provider. Make sure you discuss any questions you have with your health care provider. °  °Document Released: 11/15/2004 Document Revised: 01/21/2015 Document Reviewed: 07/15/2014 °Elsevier Interactive Patient Education ©2016 [employer]. ° ° °

## 2015-11-24 ENCOUNTER — Other Ambulatory Visit (HOSPITAL_COMMUNITY): Payer: Self-pay | Admitting: Gastroenterology

## 2015-11-24 DIAGNOSIS — R188 Other ascites: Secondary | ICD-10-CM

## 2015-11-26 ENCOUNTER — Other Ambulatory Visit: Payer: Self-pay | Admitting: Obstetrics and Gynecology

## 2015-12-02 ENCOUNTER — Telehealth: Payer: Self-pay

## 2015-12-02 NOTE — Telephone Encounter (Signed)
Patient's nephew Roderic Palau walked in office to pick up form that patient was told was ready for pick up.Unable to locate form.[name] said to call him at [phone] when ready for pick up.Message sent to [place].

## 2015-12-03 NOTE — Telephone Encounter (Signed)
lmtcb

## 2015-12-04 ENCOUNTER — Ambulatory Visit (HOSPITAL_COMMUNITY)
Admission: RE | Admit: 2015-12-04 | Discharge: 2015-12-04 | Disposition: A | Discharge status: Home / Self Care | Payer: Medicare Other | Source: Ambulatory Visit | Attending: Gastroenterology | Admitting: Gastroenterology

## 2015-12-04 DIAGNOSIS — K7460 Unspecified cirrhosis of liver: Secondary | ICD-10-CM | POA: Diagnosis not present

## 2015-12-04 DIAGNOSIS — R188 Other ascites: Secondary | ICD-10-CM | POA: Diagnosis not present

## 2015-12-04 MED ORDER — LIDOCAINE HCL 1 % IJ SOLN
INTRAMUSCULAR | Status: AC
  Filled 2015-12-04: qty 20

## 2015-12-04 MED ORDER — ALBUMIN HUMAN 25 % IV SOLN
50.0000 g | Freq: Once | INTRAVENOUS | Status: AC
  Administered 2015-12-04: 50 g via INTRAVENOUS
  Filled 2015-12-04: qty 200

## 2015-12-04 NOTE — Procedures (Signed)
Successful US guided paracentesis from right lateral abdomen.  Yielded 6 liters of clear yellow fluid.  No immediate complications.  Pt tolerated well.   Specimen was sent for labs.  Deliliah Spranger S Kasandra Fehr PA-C 12/04/2015 4:05 PM

## 2015-12-08 NOTE — Telephone Encounter (Signed)
Left form at check in for patient's nephew, Roderic Palau, to pick up last week.   Called patient. Patient has received form and has already mailed it off.

## 2015-12-18 ENCOUNTER — Ambulatory Visit (HOSPITAL_COMMUNITY)
Admission: RE | Admit: 2015-12-18 | Discharge: 2015-12-18 | Disposition: A | Discharge status: Home / Self Care | Payer: Medicare Other | Source: Ambulatory Visit | Attending: Gastroenterology | Admitting: Gastroenterology

## 2015-12-18 DIAGNOSIS — R188 Other ascites: Secondary | ICD-10-CM

## 2015-12-18 MED ORDER — LIDOCAINE HCL (PF) 1 % IJ SOLN
INTRAMUSCULAR | Status: AC
  Filled 2015-12-18: qty 10

## 2015-12-18 MED ORDER — ALBUMIN HUMAN 25 % IV SOLN
50.0000 g | Freq: Once | INTRAVENOUS | Status: AC
  Administered 2015-12-18: 50 g via INTRAVENOUS
  Filled 2015-12-18: qty 200

## 2015-12-24 ENCOUNTER — Other Ambulatory Visit (HOSPITAL_COMMUNITY): Payer: Self-pay | Admitting: Gastroenterology

## 2015-12-24 DIAGNOSIS — K7460 Unspecified cirrhosis of liver: Secondary | ICD-10-CM

## 2015-12-24 DIAGNOSIS — R188 Other ascites: Secondary | ICD-10-CM

## 2015-12-28 ENCOUNTER — Ambulatory Visit (HOSPITAL_COMMUNITY): Payer: Medicare Other

## 2016-01-01 ENCOUNTER — Ambulatory Visit (HOSPITAL_COMMUNITY)
Admission: RE | Admit: 2016-01-01 | Discharge: 2016-01-01 | Disposition: A | Discharge status: Home / Self Care | Payer: Medicare Other | Source: Ambulatory Visit | Attending: Gastroenterology | Admitting: Gastroenterology

## 2016-01-01 DIAGNOSIS — R188 Other ascites: Secondary | ICD-10-CM | POA: Diagnosis not present

## 2016-01-01 DIAGNOSIS — K7460 Unspecified cirrhosis of liver: Secondary | ICD-10-CM | POA: Diagnosis not present

## 2016-01-01 MED ORDER — LIDOCAINE HCL 1 % IJ SOLN
INTRAMUSCULAR | Status: AC
  Filled 2016-01-01: qty 20

## 2016-01-01 MED ORDER — ALBUMIN HUMAN 25 % IV SOLN
50.0000 g | Freq: Once | INTRAVENOUS | Status: AC
  Administered 2016-01-01: 50 g via INTRAVENOUS
  Filled 2016-01-01: qty 200

## 2016-01-01 NOTE — Procedures (Signed)
   US guided RLQ paracentesis  6 L maximum per MD was obtained Tolerated well  Sent for Cyto per MD  Post procedure 50 gr IV Albumin per MD

## 2016-01-11 ENCOUNTER — Ambulatory Visit (HOSPITAL_COMMUNITY): Payer: Medicare Other

## 2016-01-14 ENCOUNTER — Other Ambulatory Visit: Payer: Self-pay | Admitting: Obstetrics and Gynecology

## 2016-01-15 ENCOUNTER — Ambulatory Visit (HOSPITAL_COMMUNITY)
Admission: RE | Admit: 2016-01-15 | Discharge: 2016-01-15 | Disposition: A | Discharge status: Home / Self Care | Payer: Medicare Other | Source: Ambulatory Visit | Attending: Gastroenterology | Admitting: Gastroenterology

## 2016-01-15 DIAGNOSIS — R188 Other ascites: Secondary | ICD-10-CM

## 2016-01-15 DIAGNOSIS — K7460 Unspecified cirrhosis of liver: Secondary | ICD-10-CM | POA: Insufficient documentation

## 2016-01-15 MED ORDER — LIDOCAINE HCL (PF) 1 % IJ SOLN
INTRAMUSCULAR | Status: AC
  Filled 2016-01-15: qty 10

## 2016-01-15 MED ORDER — ALBUMIN HUMAN 25 % IV SOLN
50.0000 g | Freq: Once | INTRAVENOUS | Status: AC
  Administered 2016-01-15: 50 g via INTRAVENOUS
  Filled 2016-01-15: qty 200

## 2016-01-15 NOTE — Procedures (Signed)
   US guided RLQ paracentesis  6 Liter max per MD 6L obtained Sent for labs  Post procedure IV Albumin 50 gr per MD  Pt tolerated well

## 2016-01-25 ENCOUNTER — Ambulatory Visit (HOSPITAL_COMMUNITY): Payer: Medicare Other

## 2016-01-29 ENCOUNTER — Ambulatory Visit (HOSPITAL_COMMUNITY)
Admission: RE | Admit: 2016-01-29 | Discharge: 2016-01-29 | Disposition: A | Discharge status: Home / Self Care | Payer: Medicare Other | Source: Ambulatory Visit | Attending: Gastroenterology | Admitting: Gastroenterology

## 2016-01-29 ENCOUNTER — Ambulatory Visit: Payer: Medicare Other | Admitting: Cardiovascular Disease

## 2016-01-29 DIAGNOSIS — R188 Other ascites: Secondary | ICD-10-CM

## 2016-01-29 DIAGNOSIS — K7460 Unspecified cirrhosis of liver: Secondary | ICD-10-CM

## 2016-01-29 MED ORDER — ALBUMIN HUMAN 25 % IV SOLN
50.0000 g | Freq: Once | INTRAVENOUS | Status: AC
  Administered 2016-01-29: 50 g via INTRAVENOUS
  Filled 2016-01-29: qty 200

## 2016-01-29 MED ORDER — LIDOCAINE HCL 1 % IJ SOLN
INTRAMUSCULAR | Status: AC
  Filled 2016-01-29: qty 20

## 2016-01-29 NOTE — Procedures (Signed)
Ultrasound-guided diagnostic and therapeutic paracentesis performed yielding 6 liters of yellow colored fluid. No immediate complications.  Lora Glomski E 11:42 AM 01/29/2016

## 2016-02-04 ENCOUNTER — Other Ambulatory Visit (HOSPITAL_COMMUNITY): Payer: Self-pay | Admitting: Gastroenterology

## 2016-02-04 DIAGNOSIS — K7460 Unspecified cirrhosis of liver: Secondary | ICD-10-CM

## 2016-02-04 DIAGNOSIS — R188 Other ascites: Secondary | ICD-10-CM

## 2016-02-10 ENCOUNTER — Ambulatory Visit (INDEPENDENT_AMBULATORY_CARE_PROVIDER_SITE_OTHER): Payer: Medicare Other | Admitting: Cardiovascular Disease

## 2016-02-10 ENCOUNTER — Encounter: Payer: Self-pay | Admitting: Cardiovascular Disease

## 2016-02-10 VITALS — BP 131/75 | HR 85 | Ht 70.0 in | Wt 159.0 lb

## 2016-02-10 DIAGNOSIS — K7031 Alcoholic cirrhosis of liver with ascites: Secondary | ICD-10-CM

## 2016-02-10 DIAGNOSIS — E43 Unspecified severe protein-calorie malnutrition: Secondary | ICD-10-CM

## 2016-02-10 DIAGNOSIS — N183 Chronic kidney disease, stage 3 unspecified: Secondary | ICD-10-CM

## 2016-02-10 DIAGNOSIS — I483 Typical atrial flutter: Secondary | ICD-10-CM | POA: Diagnosis not present

## 2016-02-10 NOTE — Patient Instructions (Signed)
Dr Croitoru recommends that you schedule a follow-up appointment in 6 months. You will receive a reminder letter in the mail two months in advance. If you don't receive a letter, please call our office to schedule the follow-up appointment.  If you need a refill on your cardiac medications before your next appointment, please call your pharmacy. 

## 2016-02-10 NOTE — Progress Notes (Signed)
Patient ID: Donald Moreno, male   DOB: 17-Apr-1941, 75 y.o.   MRN: [id number]    Cardiology Office Note    Date:  02/10/2016   ID:  [name], DOB 28-Aug-1940, MRN [id number]  PCP:  Luiz Blare, DO  Cardiologist:   Sanda Klein, MD   Chief Complaint  Patient presents with  . Follow-up    3 Months; cramping in legs occasionally.    History of Present Illness:  Donald Moreno is a 75 y.o. male with advanced liver cirrhosis with portal HTN and ascites, parenchymal insufficiency with spontaneously elevated prothrombin time, with recently diagnosed atrial flutter with RVR, requiring amiodarone for rate control due to hypotension.   He requires high volume paracentesis periodically, about once every 2 weeks. His next procedure is scheduled for Friday. He sees Dr. Paulita Fujita later today.  He has not had bleeding, syncope, change in exertional dyspnea (class 2) and is unaware of palpitations. He has not had overt encephalopathy or any focal neurological events. He maintains his usual positive and laid-back attitude, despite his advanced liver disease.   Past Medical History:  Diagnosis Date  . Arthritis   . Asthma   . ETOH abuse   . Gout   . Hypertension   . Spontaneous bacterial peritonitis ([place]) 07/01/2015    No past surgical history on file.  Outpatient Medications Prior to Visit  Medication Sig Dispense Refill  . allopurinol (ZYLOPRIM) 300 MG tablet Take 1 tablet by mouth  daily 90 tablet 1  . amiodarone (PACERONE) 200 MG tablet Take 1 tablet (200 mg total) by mouth 4 (four) times a week. Tuesdays, Thursdays, Saturdays, and Sundays 90 tablet 3  . Cholecalciferol (VITAMIN D3) 1000 UNITS CAPS Take 1 capsule (1,000 Units total) by mouth daily. 60 capsule 1  . ferrous sulfate 325 (65 FE) MG tablet Take 1 tablet (325 mg total) by mouth 2 (two) times daily with a meal. (Patient taking differently: Take 325 mg by mouth daily with breakfast. ) 60 tablet 3  . Fluticasone-Salmeterol (ADVAIR  DISKUS) 500-50 MCG/DOSE AEPB Inhale 1 puff two times  daily 120 each 3  . furosemide (LASIX) 20 MG tablet Take 1 tablet by mouth  daily 60 tablet 5  . montelukast (SINGULAIR) 10 MG tablet Take 1 tablet (10 mg total) by mouth at bedtime. 90 tablet 6  . Nutritional Supplements (ENSURE COMPLETE SHAKE) LIQD Take 1 Container by mouth daily. 237 mL 11  . PROAIR HFA 108 (90 Base) MCG/ACT inhaler Use 2 puffs every 4 hours  as needed for wheezing or  shortness of breath 51 g 3  . spironolactone (ALDACTONE) 50 MG tablet Take 1 tablet by mouth  daily 60 tablet 5   No facility-administered medications prior to visit.      Allergies:   Review of patient's allergies indicates no known allergies.   Social History   Social History  . Marital status: Divorced    Spouse name: N/A  . Number of children: 5  . Years of education: 10   Occupational History  . Retired- Administrator    Social History Main Topics  . Smoking status: Never Smoker  . Smokeless tobacco: Current User  . Alcohol use 1.2 oz/week    2 Cans of beer per week  . Drug use: No  . Sexual activity: Not Currently   Other Topics Concern  . None   Social History Narrative   Lives with his sister Stanton Kidney and nephew in [place] home.  Alcohol: 2 cans of beer daily    Tobacco: snuff all day (now 1/3 can per day). Never smoked.    Exercise: walks a to store a few times a week.       Other: dentures, walker (occasionally)      Will be visiting his son in [place] for his graduation in July 2012.          Hobbies:  TV, drink beer, visit with friends    Pets:  Dog, Bean     Family History:  The patient's family history includes Asthma in his brother and sister; Heart disease in his sister; Other in his father and mother.   ROS:   Please see the history of present illness.    ROS All other systems reviewed and are negative.   PHYSICAL EXAM:   VS:  BP 131/75   Pulse 85   Ht 5\' 10"  (1.778 m)   Wt 72.1 kg (159 lb)   BMI  22.81 kg/m    GEN: cachectic, well developed, in no acute distress  HEENT: normal  Neck: no JVD, carotid bruits, or masses Cardiac: irregular, no murmurs, rubs, or gallops,no edema  Respiratory:  clear to auscultation bilaterally, normal work of breathing GI: tense, nontender ascites, + BS MS: no deformity or atrophy  Skin: warm and dry, no rash Neuro:  Alert and Oriented x 3, Strength and sensation are intact Psych: euthymic mood, full affect  Wt Readings from Last 3 Encounters:  02/10/16 72.1 kg (159 lb)  01/29/16 68 kg (150 lb)  01/01/16 68 kg (150 lb)      Studies/Labs Reviewed:   EKG:  EKG is not ordered today.   Recent Labs: 07/01/2015: TSH 3.004 07/02/2015: Magnesium 2.0 07/05/2015: ALT 19; Hemoglobin 10.2; Platelets 163 07/06/2015: BUN 45; Creatinine, Ser 1.68; Potassium 4.9; Sodium 137   Lipid Panel    Component Value Date/Time   CHOL 141 11/11/2011 1030   TRIG 128 11/11/2011 1030   HDL 71 11/11/2011 1030   CHOLHDL 2.0 11/11/2011 1030   VLDL 26 11/11/2011 1030   LDLCALC 44 11/11/2011 1030     ASSESSMENT:    1. Typical atrial flutter ([place])   2. Alcoholic cirrhosis of liver with ascites ([place])   3. Protein-calorie malnutrition, severe   4. CKD (chronic kidney disease), stage III      PLAN:  In order of problems listed above:  1. Atrial flutter: controlled ventricular rate, not a good candidate for anticoagulation or ablation. He has been taking amiodarone daily instead of 4 days a week as prescribed. Will cut this back to 1 tablet on Tuesday Thursday Saturday and Sunday only. If his blood pressure remains in this range we will switch to a nonselective beta blocker at his next appointment. 2. Cirrhosis: seems to have severe disease due to ascites/portal hypertension, but not markers of rapid progression. 3. His BMI overestimates true nutritional status 4. CKD:  Baseline creatinine appears to be 1.5-1.7.    Medication Adjustments/Labs and Tests  Ordered: Current medicines are reviewed at length with the patient today.  Concerns regarding medicines are outlined above.  Medication changes, Labs and Tests ordered today are listed in the Patient Instructions below. Patient Instructions  Dr Sallyanne Kuster recommends that you schedule a follow-up appointment in 6 months. You will receive a reminder letter in the mail two months in advance. If you don't receive a letter, please call our office to schedule the follow-up appointment.  If you need a refill on your  cardiac medications before your next appointment, please call your pharmacy.      Signed, Sanda Klein, MD  02/10/2016 8:56 AM    [facility] [address], [place], [place]  [address] Phone: [phone]; Fax: [phone]

## 2016-02-11 DIAGNOSIS — R188 Other ascites: Secondary | ICD-10-CM | POA: Diagnosis not present

## 2016-02-11 DIAGNOSIS — Z7901 Long term (current) use of anticoagulants: Secondary | ICD-10-CM | POA: Diagnosis not present

## 2016-02-11 DIAGNOSIS — K7460 Unspecified cirrhosis of liver: Secondary | ICD-10-CM | POA: Diagnosis not present

## 2016-02-12 ENCOUNTER — Ambulatory Visit (HOSPITAL_COMMUNITY)
Admission: RE | Admit: 2016-02-12 | Discharge: 2016-02-12 | Disposition: A | Discharge status: Home / Self Care | Payer: Medicare Other | Source: Ambulatory Visit | Attending: Gastroenterology | Admitting: Gastroenterology

## 2016-02-12 DIAGNOSIS — R188 Other ascites: Secondary | ICD-10-CM | POA: Insufficient documentation

## 2016-02-12 DIAGNOSIS — K7460 Unspecified cirrhosis of liver: Secondary | ICD-10-CM | POA: Insufficient documentation

## 2016-02-12 MED ORDER — LIDOCAINE HCL (PF) 1 % IJ SOLN
INTRAMUSCULAR | Status: AC
  Filled 2016-02-12: qty 10

## 2016-02-12 MED ORDER — ALBUMIN HUMAN 25 % IV SOLN
50.0000 g | Freq: Once | INTRAVENOUS | Status: AC
  Administered 2016-02-12: 50 g via INTRAVENOUS
  Filled 2016-02-12: qty 200

## 2016-02-12 NOTE — Procedures (Signed)
Ultrasound-guided diagnostic and therapeutic paracentesis performed yielding 6 liters of yellow colored fluid. No immediate complications.  Donald Moreno E 10:54 AM 02/12/2016

## 2016-02-15 DIAGNOSIS — R748 Abnormal levels of other serum enzymes: Secondary | ICD-10-CM | POA: Diagnosis not present

## 2016-02-20 ENCOUNTER — Other Ambulatory Visit: Payer: Self-pay | Admitting: Obstetrics and Gynecology

## 2016-02-23 DIAGNOSIS — R188 Other ascites: Secondary | ICD-10-CM | POA: Diagnosis not present

## 2016-02-23 DIAGNOSIS — K7460 Unspecified cirrhosis of liver: Secondary | ICD-10-CM | POA: Diagnosis not present

## 2016-02-25 ENCOUNTER — Other Ambulatory Visit (HOSPITAL_COMMUNITY): Payer: Self-pay | Admitting: Gastroenterology

## 2016-02-25 DIAGNOSIS — K7460 Unspecified cirrhosis of liver: Secondary | ICD-10-CM

## 2016-02-25 DIAGNOSIS — R188 Other ascites: Secondary | ICD-10-CM

## 2016-02-26 ENCOUNTER — Ambulatory Visit (HOSPITAL_COMMUNITY)
Admission: RE | Admit: 2016-02-26 | Discharge: 2016-02-26 | Disposition: A | Discharge status: Home / Self Care | Payer: Medicare Other | Source: Ambulatory Visit | Attending: Gastroenterology | Admitting: Gastroenterology

## 2016-02-26 ENCOUNTER — Encounter (HOSPITAL_COMMUNITY): Payer: Self-pay

## 2016-02-26 DIAGNOSIS — R188 Other ascites: Secondary | ICD-10-CM

## 2016-02-26 DIAGNOSIS — K7460 Unspecified cirrhosis of liver: Secondary | ICD-10-CM

## 2016-02-26 MED ORDER — LIDOCAINE HCL 1 % IJ SOLN
INTRAMUSCULAR | Status: AC
  Filled 2016-02-26: qty 20

## 2016-02-26 MED ORDER — ALBUMIN HUMAN 25 % IV SOLN
50.0000 g | Freq: Once | INTRAVENOUS | Status: AC
  Administered 2016-02-26: 50 g via INTRAVENOUS
  Filled 2016-02-26: qty 200

## 2016-02-26 NOTE — Procedures (Signed)
Ultrasound-guided diagnostic and therapeutic paracentesis performed yielding 7 liters of yellow colored fluid. No immediate complications.  Skila Rollins E 11:34 AM 02/26/2016

## 2016-02-29 DIAGNOSIS — K7460 Unspecified cirrhosis of liver: Secondary | ICD-10-CM | POA: Diagnosis not present

## 2016-02-29 DIAGNOSIS — R188 Other ascites: Secondary | ICD-10-CM | POA: Diagnosis not present

## 2016-03-04 ENCOUNTER — Ambulatory Visit (HOSPITAL_COMMUNITY)
Admission: RE | Admit: 2016-03-04 | Discharge: 2016-03-04 | Disposition: A | Discharge status: Home / Self Care | Payer: Medicare Other | Source: Ambulatory Visit | Attending: Gastroenterology | Admitting: Gastroenterology

## 2016-03-04 DIAGNOSIS — R188 Other ascites: Secondary | ICD-10-CM | POA: Insufficient documentation

## 2016-03-04 DIAGNOSIS — K7460 Unspecified cirrhosis of liver: Secondary | ICD-10-CM | POA: Diagnosis not present

## 2016-03-04 MED ORDER — LIDOCAINE HCL (PF) 1 % IJ SOLN
INTRAMUSCULAR | Status: AC
  Filled 2016-03-04: qty 10

## 2016-03-04 MED ORDER — ALBUMIN HUMAN 25 % IV SOLN
50.0000 g | Freq: Once | INTRAVENOUS | Status: AC
Start: 2016-03-04 — End: 2016-03-04
  Administered 2016-03-04: 50 g via INTRAVENOUS
  Filled 2016-03-04: qty 200

## 2016-03-04 NOTE — Procedures (Signed)
Ultrasound-guided diagnostic and therapeutic paracentesis performed yielding 7  liters of yellow fluid. No immediate complications. A portion of the fluid was sent to the lab for preordered studies. Pt will receive IV albumin postprocedure.

## 2016-03-09 ENCOUNTER — Ambulatory Visit (INDEPENDENT_AMBULATORY_CARE_PROVIDER_SITE_OTHER): Payer: Medicare Other | Admitting: Obstetrics and Gynecology

## 2016-03-09 ENCOUNTER — Encounter: Payer: Self-pay | Admitting: Obstetrics and Gynecology

## 2016-03-09 VITALS — BP 153/76 | HR 49 | Temp 97.9°F | Ht 70.0 in | Wt 161.4 lb

## 2016-03-09 DIAGNOSIS — E43 Unspecified severe protein-calorie malnutrition: Secondary | ICD-10-CM

## 2016-03-09 DIAGNOSIS — Z23 Encounter for immunization: Secondary | ICD-10-CM

## 2016-03-09 DIAGNOSIS — I483 Typical atrial flutter: Secondary | ICD-10-CM | POA: Diagnosis not present

## 2016-03-09 DIAGNOSIS — Z0000 Encounter for general adult medical examination without abnormal findings: Secondary | ICD-10-CM | POA: Diagnosis not present

## 2016-03-09 DIAGNOSIS — N183 Chronic kidney disease, stage 3 unspecified: Secondary | ICD-10-CM

## 2016-03-09 DIAGNOSIS — I10 Essential (primary) hypertension: Secondary | ICD-10-CM | POA: Diagnosis not present

## 2016-03-09 DIAGNOSIS — K7031 Alcoholic cirrhosis of liver with ascites: Secondary | ICD-10-CM | POA: Diagnosis not present

## 2016-03-09 NOTE — Patient Instructions (Addendum)
Received flu shot today Up to date on health maintenance   Follow-up with specialist as directed  Follow-up in 6 months

## 2016-03-09 NOTE — Progress Notes (Signed)
Subjective: Chief Complaint  Patient presents with  . Annual Exam     HPI: Donald Moreno is a13 y.o. year old male presents for preventative visit and annual physical.   Acute Concerns: None Has had some changes in medical care recently. Was stopped on all his diuretic medications due to renal function. Sees nephrologist. Addiment that he does not want to ever do dialysis. Has nephrology appointment tomorrow.  Now gets paracentesis every week  Diet: eats "good" takes ensure when can afford it   Exercise: walks everyday   Sexual History: not currently active  POA/Living Will: Yes  Social:  Social History   Social History  . Marital status: Divorced    Spouse name: N/A  . Number of children: 5  . Years of education: 10   Occupational History  . Retired- Administrator    Social History Main Topics  . Smoking status: Never Smoker  . Smokeless tobacco: Current User  . Alcohol use 1.2 oz/week    2 Cans of beer per week  . Drug use: No  . Sexual activity: Not Currently   Other Topics Concern  . None   Social History Narrative   Lives with nephew and niece in [place] home.     Tobacco: snuff all day (can last two days. Never smoked.       Hobbies: cooking, sleeping, going to store   Pets:  Dog, Bean    Immunization: Immunization History  Administered Date(s) Administered  . Influenza Split 02/15/2011, 03/07/2012  . Influenza Whole 04/30/2007, 02/28/2008, 02/18/2009, 02/16/2010  . Influenza,inj,Quad PF,36+ Mos 01/30/2013, 01/27/2014, 03/04/2015  . Pneumococcal Conjugate-13 01/30/2013  . Pneumococcal Polysaccharide-23 02/13/1998, 04/25/2008  . Td 05/17/1999, 05/31/2010    Cancer Screening:  Colonoscopy: Up to date  Review of Systems: Per HPI.  Objective:  BP (!) 153/76 (BP Location: Left Arm, Patient Position: Sitting, Cuff Size: Small)   Pulse (!) 49   Temp 97.9 F (36.6 C) (Oral)   Ht 5\' 10"  (1.778 m)   Wt 161 lb 6.4 oz (73.2 kg)   SpO2 100%    BMI 23.16 kg/m   Physical Exam Gen:  75 y.o. male in NAD, cachectic HEENT: NCAT, MMM, EOMI, PERRL, anicteric sclerae CV: RRR, no MRG, no JVD Resp: Non-labored, CTAB, no wheezes noted Abd: Soft, NT, distended with ascites, diminished BS, no guarding or organomegaly, umbilical hernia Ext: WWP, no edema Skin: No rashes   MSK: Full ROM, strength intact Neuro: Alert and oriented, speech normal       Chemistry      Component Value Date/Time   NA 137 07/06/2015 0551   K 4.9 07/06/2015 0551   CL 107 07/06/2015 0551   CO2 23 07/06/2015 0551   BUN 45 (H) 07/06/2015 0551   CREATININE 1.68 (H) 07/06/2015 0551   CREATININE 1.81 (H) 05/27/2015 0916      Component Value Date/Time   CALCIUM 8.8 (L) 07/06/2015 0551   ALKPHOS 108 07/05/2015 0525   AST 37 07/05/2015 0525   ALT 19 07/05/2015 0525   BILITOT 0.6 07/05/2015 0525      Lab Results  Component Value Date   WBC 6.6 07/05/2015   HGB 10.2 (L) 07/05/2015   HCT 30.9 (L) 07/05/2015   MCV 98.4 07/05/2015   PLT 163 07/05/2015   Lab Results  Component Value Date   TSH 3.004 07/01/2015   Lab Results  Component Value Date   HGBA1C 5.1 09/10/2008      ASSESSMENT &  PLAN: 75 y.o. male presents for annual preventative exam. Please see problem specific assessment and plan.    Orders Placed This Encounter  Procedures  . Flu Vaccine QUAD 36+ mos IM      Luiz Blare, DO 03/09/2016, 2:51 PM PGY-3, [place]

## 2016-03-10 DIAGNOSIS — N2581 Secondary hyperparathyroidism of renal origin: Secondary | ICD-10-CM | POA: Diagnosis not present

## 2016-03-10 DIAGNOSIS — I129 Hypertensive chronic kidney disease with stage 1 through stage 4 chronic kidney disease, or unspecified chronic kidney disease: Secondary | ICD-10-CM | POA: Diagnosis not present

## 2016-03-10 DIAGNOSIS — N183 Chronic kidney disease, stage 3 (moderate): Secondary | ICD-10-CM | POA: Diagnosis not present

## 2016-03-10 DIAGNOSIS — D631 Anemia in chronic kidney disease: Secondary | ICD-10-CM | POA: Diagnosis not present

## 2016-03-11 ENCOUNTER — Ambulatory Visit (HOSPITAL_COMMUNITY)
Admission: RE | Admit: 2016-03-11 | Discharge: 2016-03-11 | Disposition: A | Discharge status: Home / Self Care | Payer: Medicare Other | Source: Ambulatory Visit | Attending: Gastroenterology | Admitting: Gastroenterology

## 2016-03-11 DIAGNOSIS — R188 Other ascites: Secondary | ICD-10-CM | POA: Insufficient documentation

## 2016-03-11 DIAGNOSIS — K7460 Unspecified cirrhosis of liver: Secondary | ICD-10-CM

## 2016-03-11 MED ORDER — ALBUMIN HUMAN 25 % IV SOLN
50.0000 g | Freq: Once | INTRAVENOUS | Status: AC
Start: 2016-03-11 — End: 2016-03-11
  Administered 2016-03-11: 50 g via INTRAVENOUS
  Filled 2016-03-11: qty 200

## 2016-03-11 MED ORDER — LIDOCAINE HCL (PF) 1 % IJ SOLN
INTRAMUSCULAR | Status: AC
  Filled 2016-03-11: qty 10

## 2016-03-11 NOTE — Assessment & Plan Note (Signed)
Chronic ascites that is now requiring paracentesis every week. Patient denies any fevers or abdominal pain. Follows with gastroenterology. No longer on diuretic therapy to help with ascites due to renal function.

## 2016-03-11 NOTE — Assessment & Plan Note (Signed)
Flu vaccine given today. Otherwise up to date on all health maintenance topics.

## 2016-03-11 NOTE — Assessment & Plan Note (Signed)
Worsening renal function; CKD 3. Follows with nephrology. Patient does not want dialysis. Has follow-up with nephrology tomorrow.

## 2016-03-11 NOTE — Procedures (Signed)
Successful US guided paracentesis from RLQ.  Yielded 7L of clear yellow fluid.  No immediate complications.  Pt tolerated well.   Specimen was sent for labs.  Ascencion Dike PA-C 03/11/2016 11:30 AM

## 2016-03-11 NOTE — Assessment & Plan Note (Signed)
BP mildly elevated for age. Goal <150/90. Has now been discontinued off of diuretic therapy due to renal function worsening. Will continue to monitor BP. No additions needed at this time.

## 2016-03-11 NOTE — Assessment & Plan Note (Signed)
Cachectic appearing elderly [place] male. Takes inserted to help with supplementation. Poor diet and appetite. Weight fluctuates due to ascites but baseline appears to be ~150lbs. Continue to monitor.

## 2016-03-11 NOTE — Assessment & Plan Note (Signed)
Follows with cardiology. Regular rhythm today. Takes amiodarone with good rate control. Not a candidate for anticoagulation due to h/o GI bleed and cirrhosis.

## 2016-03-18 ENCOUNTER — Ambulatory Visit (HOSPITAL_COMMUNITY)
Admission: RE | Admit: 2016-03-18 | Discharge: 2016-03-18 | Disposition: A | Discharge status: Home / Self Care | Payer: Medicare Other | Source: Ambulatory Visit | Attending: Gastroenterology | Admitting: Gastroenterology

## 2016-03-18 DIAGNOSIS — R188 Other ascites: Secondary | ICD-10-CM | POA: Diagnosis not present

## 2016-03-18 DIAGNOSIS — K7460 Unspecified cirrhosis of liver: Secondary | ICD-10-CM | POA: Diagnosis not present

## 2016-03-18 MED ORDER — ALBUMIN HUMAN 25 % IV SOLN
50.0000 g | Freq: Once | INTRAVENOUS | Status: AC
  Administered 2016-03-18: 50 g via INTRAVENOUS
  Filled 2016-03-18: qty 200

## 2016-03-18 MED ORDER — LIDOCAINE HCL (PF) 1 % IJ SOLN
INTRAMUSCULAR | Status: AC
  Filled 2016-03-18: qty 10

## 2016-03-18 NOTE — Procedures (Signed)
   US guided LLQ paracentesis 7 Liter maximum; yellowfluid obtained  Tolerated well  IV Albumin 50 gr post procedure per MD

## 2016-03-25 ENCOUNTER — Ambulatory Visit (HOSPITAL_COMMUNITY)
Admission: RE | Admit: 2016-03-25 | Discharge: 2016-03-25 | Disposition: A | Discharge status: Home / Self Care | Payer: Medicare Other | Source: Ambulatory Visit | Attending: Gastroenterology | Admitting: Gastroenterology

## 2016-03-25 DIAGNOSIS — K7460 Unspecified cirrhosis of liver: Secondary | ICD-10-CM | POA: Insufficient documentation

## 2016-03-25 DIAGNOSIS — R188 Other ascites: Secondary | ICD-10-CM | POA: Insufficient documentation

## 2016-03-25 MED ORDER — ALBUMIN HUMAN 25 % IV SOLN
50.0000 g | Freq: Once | INTRAVENOUS | Status: AC
  Administered 2016-03-25: 50 g via INTRAVENOUS
  Filled 2016-03-25: qty 200

## 2016-03-25 MED ORDER — LIDOCAINE HCL (PF) 1 % IJ SOLN
INTRAMUSCULAR | Status: AC
  Filled 2016-03-25: qty 10

## 2016-03-25 NOTE — Procedures (Signed)
Ultrasound-guided diagnostic and therapeutic paracentesis performed yielding 7 liters of yellow colored fluid. No immediate complications.  [name] E 12:19 PM 03/25/2016

## 2016-03-29 ENCOUNTER — Other Ambulatory Visit (HOSPITAL_COMMUNITY): Payer: Self-pay | Admitting: Gastroenterology

## 2016-03-29 DIAGNOSIS — K7460 Unspecified cirrhosis of liver: Secondary | ICD-10-CM

## 2016-03-29 DIAGNOSIS — R188 Other ascites: Secondary | ICD-10-CM

## 2016-04-01 ENCOUNTER — Ambulatory Visit (HOSPITAL_COMMUNITY)
Admission: RE | Admit: 2016-04-01 | Discharge: 2016-04-01 | Disposition: A | Discharge status: Home / Self Care | Payer: Medicare Other | Source: Ambulatory Visit | Attending: Gastroenterology | Admitting: Gastroenterology

## 2016-04-01 DIAGNOSIS — R188 Other ascites: Secondary | ICD-10-CM | POA: Diagnosis not present

## 2016-04-01 DIAGNOSIS — K7460 Unspecified cirrhosis of liver: Secondary | ICD-10-CM

## 2016-04-01 MED ORDER — LIDOCAINE HCL (PF) 1 % IJ SOLN
INTRAMUSCULAR | Status: AC
  Filled 2016-04-01: qty 10

## 2016-04-01 MED ORDER — ALBUMIN HUMAN 25 % IV SOLN
50.0000 g | Freq: Once | INTRAVENOUS | Status: AC
  Administered 2016-04-01: 50 g via INTRAVENOUS
  Filled 2016-04-01: qty 200

## 2016-04-01 NOTE — Procedures (Signed)
US guided right lateral abdomen paracentesis 7L of clear, yellow fluid aspirated Sample sent for labs. Patient tolerated procedure without complications.  Received 50g albumin post-procedure.   Docia Barrier PA-C

## 2016-04-06 ENCOUNTER — Encounter: Payer: Self-pay | Admitting: *Deleted

## 2016-04-06 NOTE — Telephone Encounter (Signed)
This encounter was created in error - please disregard.

## 2016-04-11 ENCOUNTER — Ambulatory Visit (HOSPITAL_COMMUNITY)
Admission: RE | Admit: 2016-04-11 | Discharge: 2016-04-11 | Disposition: A | Discharge status: Home / Self Care | Payer: Medicare Other | Source: Ambulatory Visit | Attending: Gastroenterology | Admitting: Gastroenterology

## 2016-04-11 DIAGNOSIS — R188 Other ascites: Secondary | ICD-10-CM

## 2016-04-11 DIAGNOSIS — K7460 Unspecified cirrhosis of liver: Secondary | ICD-10-CM | POA: Diagnosis not present

## 2016-04-11 MED ORDER — ALBUMIN HUMAN 25 % IV SOLN
50.0000 g | Freq: Once | INTRAVENOUS | Status: AC
  Administered 2016-04-11: 50 g via INTRAVENOUS
  Filled 2016-04-11 (×2): qty 200

## 2016-04-11 MED ORDER — LIDOCAINE HCL 1 % IJ SOLN
INTRAMUSCULAR | Status: AC
  Filled 2016-04-11: qty 20

## 2016-04-11 NOTE — Procedures (Signed)
Successful US guided paracentesis from right lateral abdomen.  Yielded 7L of clear, yellow fluid.  No immediate complications.  Pt tolerated well.   Specimen was sent for labs.  Docia Barrier PA-C 04/11/2016 11:45 AM

## 2016-04-15 ENCOUNTER — Ambulatory Visit (HOSPITAL_COMMUNITY): Payer: Medicare Other

## 2016-04-18 ENCOUNTER — Ambulatory Visit (HOSPITAL_COMMUNITY)
Admission: RE | Admit: 2016-04-18 | Discharge: 2016-04-18 | Disposition: A | Discharge status: Home / Self Care | Payer: Medicare Other | Source: Ambulatory Visit | Attending: Gastroenterology | Admitting: Gastroenterology

## 2016-04-18 DIAGNOSIS — K7460 Unspecified cirrhosis of liver: Secondary | ICD-10-CM | POA: Diagnosis not present

## 2016-04-18 DIAGNOSIS — R188 Other ascites: Secondary | ICD-10-CM | POA: Diagnosis not present

## 2016-04-18 MED ORDER — ALBUMIN HUMAN 25 % IV SOLN
50.0000 g | Freq: Once | INTRAVENOUS | Status: AC
  Administered 2016-04-18: 50 g via INTRAVENOUS
  Filled 2016-04-18: qty 200

## 2016-04-18 MED ORDER — LIDOCAINE HCL 1 % IJ SOLN
INTRAMUSCULAR | Status: AC
  Filled 2016-04-18: qty 20

## 2016-04-18 NOTE — Procedures (Signed)
US paracentesis No complication No blood loss. See complete dictation in [facility].

## 2016-04-22 ENCOUNTER — Ambulatory Visit (HOSPITAL_COMMUNITY): Payer: Medicare Other

## 2016-04-25 ENCOUNTER — Ambulatory Visit (HOSPITAL_COMMUNITY)
Admission: RE | Admit: 2016-04-25 | Discharge: 2016-04-25 | Disposition: A | Discharge status: Home / Self Care | Payer: Medicare Other | Source: Ambulatory Visit | Attending: Gastroenterology | Admitting: Gastroenterology

## 2016-04-25 DIAGNOSIS — R188 Other ascites: Secondary | ICD-10-CM | POA: Diagnosis not present

## 2016-04-25 DIAGNOSIS — K7460 Unspecified cirrhosis of liver: Secondary | ICD-10-CM | POA: Diagnosis not present

## 2016-04-25 MED ORDER — LIDOCAINE HCL 1 % IJ SOLN
INTRAMUSCULAR | Status: AC
  Filled 2016-04-25: qty 20

## 2016-04-25 MED ORDER — ALBUMIN HUMAN 25 % IV SOLN
50.0000 g | Freq: Once | INTRAVENOUS | Status: AC
  Administered 2016-04-25: 50 g via INTRAVENOUS
  Filled 2016-04-25: qty 200

## 2016-04-25 NOTE — Procedures (Signed)
   US guided LLQ paracentesis 7 liter maximum per MD  Tolerated well Sent for cytology per ordering MD.  Post procedure 50 gr IV Albumin

## 2016-04-29 ENCOUNTER — Ambulatory Visit (HOSPITAL_COMMUNITY): Payer: Medicare Other

## 2016-05-02 ENCOUNTER — Ambulatory Visit (HOSPITAL_COMMUNITY)
Admission: RE | Admit: 2016-05-02 | Discharge: 2016-05-02 | Disposition: A | Discharge status: Home / Self Care | Payer: Medicare Other | Source: Ambulatory Visit | Attending: Gastroenterology | Admitting: Gastroenterology

## 2016-05-02 DIAGNOSIS — K7460 Unspecified cirrhosis of liver: Secondary | ICD-10-CM | POA: Diagnosis not present

## 2016-05-02 DIAGNOSIS — R188 Other ascites: Secondary | ICD-10-CM | POA: Diagnosis not present

## 2016-05-02 MED ORDER — LIDOCAINE HCL 1 % IJ SOLN
INTRAMUSCULAR | Status: AC
  Filled 2016-05-02: qty 20

## 2016-05-02 MED ORDER — ALBUMIN HUMAN 25 % IV SOLN
50.0000 g | Freq: Once | INTRAVENOUS | Status: AC
  Administered 2016-05-02: 50 g via INTRAVENOUS
  Filled 2016-05-02 (×2): qty 200

## 2016-05-02 NOTE — Procedures (Signed)
Successful US guided paracentesis from RLQ.  Yielded 7L of clear yellow fluid.  No immediate complications.  Pt tolerated well.   Specimen was sent for labs.  Ascencion Dike PA-C 05/02/2016 10:49 AM

## 2016-05-04 ENCOUNTER — Ambulatory Visit (INDEPENDENT_AMBULATORY_CARE_PROVIDER_SITE_OTHER): Payer: Medicare Other | Admitting: *Deleted

## 2016-05-04 ENCOUNTER — Encounter: Payer: Self-pay | Admitting: *Deleted

## 2016-05-04 VITALS — BP 124/66 | HR 71 | Temp 98.0°F | Ht 70.5 in | Wt 154.6 lb

## 2016-05-04 DIAGNOSIS — Z0000 Encounter for general adult medical examination without abnormal findings: Secondary | ICD-10-CM

## 2016-05-04 NOTE — Progress Notes (Signed)
Subjective:   Donald Moreno is a 75 y.o. male who presents for Medicare Annual/Subsequent preventive examination.  Cardiac Risk Factors include: advanced age (>74men, >35 women);smoking/ tobacco exposure;hypertension     Objective:    Vitals: BP 124/66 (BP Location: Left Arm, Patient Position: Sitting, Cuff Size: Normal)   Pulse 71   Temp 98 F (36.7 C) (Oral)   Ht 5' 10.5" (1.791 m)   Wt 154 lb 9.6 oz (70.1 kg)   SpO2 98%   BMI 21.87 kg/m   Body mass index is 21.87 kg/m.  Tobacco History  Smoking Status  . Never Smoker  Smokeless Tobacco  . Current User  . Types: Snuff  Patient dips snuff and is not ready to quit.    Past Medical History:  Diagnosis Date  . Arthritis   . Asthma   . ETOH abuse   . Gout   . Hypertension   . Liver cirrhosis ([place]) 2015  . Spontaneous bacterial peritonitis ([place]) 07/01/2015   History reviewed. No pertinent surgical history. Family History  Problem Relation Age of Onset  . Other Mother     died when pt was only 58 - unknown cause.  . Other Father     deceased.  . Asthma Sister   . Heart disease Sister     multiple stents.  . Asthma Brother   . Hypertension Brother   . Asthma Son   . Asthma Brother   . Hypertension Brother    History  Sexual Activity  . Sexual activity: Not Currently    Outpatient Encounter Prescriptions as of 05/04/2016  Medication Sig  . ADVAIR DISKUS 500-50 MCG/DOSE AEPB USE 1 PUFF TWO TIMES DAILY  . amiodarone (PACERONE) 200 MG tablet Take 1 tablet (200 mg total) by mouth 4 (four) times a week. Tuesdays, Thursdays, Saturdays, and Sundays  . Cholecalciferol (VITAMIN D3) 1000 UNITS CAPS Take 1 capsule (1,000 Units total) by mouth daily.  . ferrous sulfate 325 (65 FE) MG tablet Take 1 tablet (325 mg total) by mouth 2 (two) times daily with a meal. (Patient taking differently: Take 325 mg by mouth daily with breakfast. )  . montelukast (SINGULAIR) 10 MG tablet Take 1 tablet (10 mg total) by mouth at  bedtime.  . Nutritional Supplements (ENSURE COMPLETE SHAKE) LIQD Take 1 Container by mouth daily.  Marland Kitchen PROAIR HFA 108 (90 Base) MCG/ACT inhaler Use 2 puffs every 4 hours  as needed for wheezing or  shortness of breath  . allopurinol (ZYLOPRIM) 300 MG tablet Take 1 tablet by mouth  daily (Patient not taking: Reported on 05/04/2016)   No facility-administered encounter medications on file as of 05/04/2016.     Activities of Daily Living In your present state of health, do you have any difficulty performing the following activities: 05/04/2016 05/02/2016  Hearing? N N  Vision? N N  Difficulty concentrating or making decisions? N N  Walking or climbing stairs? Y N  Dressing or bathing? N N  Doing errands, shopping? N -  Preparing Food and eating ? N -  Using the Toilet? N -  In the past six months, have you accidently leaked urine? N -  Do you have problems with loss of bowel control? N -  Managing your Medications? N -  Managing your Finances? N -  Housekeeping or managing your Housekeeping? N -  Some recent data might be hidden   Home Safety:  My home has a working smoke alarm:  Yes X 2  My home throw rugs have been fastened down to the floor or removed:  Removed I have non-slip mats in the bathtub and shower:  Yes plus bath stool         All my home's stairs have railings or bannisters: One level home with a half step outside without railing         My home's floors, stairs and hallways are free from clutter, wires and cords:  Yes     I wear seatbelts consistently:  Yes   Patient Care Team: Katheren Shams, DO as PCP - General Sanda Klein, MD as Consulting Physician (Cardiology) Arta Silence, MD as Consulting Physician (Gastroenterology)   Also sees kidney specialist but does not know name Assessment:     Exercise Activities and Dietary recommendations Current Exercise Habits: Home exercise routine, Type of exercise: walking, Time (Minutes): 10, Frequency  (Times/Week): 7, Weekly Exercise (Minutes/Week): 70, Intensity: Moderate  Goals    . Blood Pressure < 140/90    . Eat more fruits and vegetables, increase vegetables to 1-3 x week.     . Reduce daily sodium    . Weight < 185 lb (83.915 kg)      Fall Risk Fall Risk  05/04/2016 09/23/2015 07/13/2015 05/06/2015 03/31/2015  Falls in the past year? No No No No No   Depression Screen PHQ 2/9 Scores 05/04/2016 09/23/2015 07/13/2015 05/06/2015  PHQ - 2 Score 0 0 0 0   TUG Test:  Done in 27 seconds. Patient used both hands to push out of chair and to sit back down. Used slow tentative steps with little arm swing.  Cognitive Function: Mini-Cog  Passed with score 3/5  Cognitive Function MMSE - Mini Mental State Exam 07/31/2012 06/28/2011  Orientation to time 5 5  Orientation to Place 5 5  Registration 3 3  Attention/ Calculation 0 0  Recall 2 2  Language- name 2 objects 2 2  Language- repeat 1 1  Language- follow 3 step command 3 3  Language- read & follow direction 0 1  Write a sentence 0 0  Copy design 1 1  Total score 22 23        Immunization History  Administered Date(s) Administered  . Influenza Split 02/15/2011, 03/07/2012  . Influenza Whole 04/30/2007, 02/28/2008, 02/18/2009, 02/16/2010  . Influenza,inj,Quad PF,36+ Mos 01/30/2013, 01/27/2014, 03/04/2015, 03/09/2016  . Pneumococcal Conjugate-13 01/30/2013  . Pneumococcal Polysaccharide-23 02/13/1998, 04/25/2008  . Td 05/17/1999, 05/31/2010   Screening Tests Health Maintenance  Topic Date Due  . TETANUS/TDAP  05/31/2020  . COLONOSCOPY  11/13/2024  . INFLUENZA VACCINE  Completed  . ZOSTAVAX  Addressed  . PNA vac Low Risk Adult  Completed      Plan:   Patient does not read but has niece and nephew-in-law who check on him daily and will read literature to him from doctors. DASH eating plan given and reviewed with patient. Encouraged to use Mrs. Dash instead of salt and to choose foods with 5% or less DV for sodium.  Patient states understanding.  During the course of the visit the patient was educated and counseled about the following appropriate screening and preventive services:   Vaccines to include Pneumoccal, Influenza, Td, Zostavax  Cardiovascular Disease  Colorectal cancer screening  Diabetes screening  Nutrition counseling   Patient Instructions (the written plan) was given to the patient.    Velora Heckler, RN  05/04/2016

## 2016-05-04 NOTE — Patient Instructions (Addendum)
DASH Eating Plan DASH stands for "Dietary Approaches to Stop Hypertension." The DASH eating plan is a healthy eating plan that has been shown to reduce high blood pressure (hypertension). Additional health benefits may include reducing the risk of type 2 diabetes mellitus, heart disease, and stroke. The DASH eating plan may also help with weight loss. What do I need to know about the DASH eating plan? For the DASH eating plan, you will follow these general guidelines:  Choose foods with less than 150 milligrams of sodium per serving (as listed on the food label).  Use salt-free seasonings or herbs instead of table salt or sea salt.  Check with your health care provider or pharmacist before using salt substitutes.  Eat lower-sodium products. These are often labeled as "low-sodium" or "no salt added."  Eat fresh foods. Avoid eating a lot of canned foods.  Eat more vegetables, fruits, and low-fat dairy products.  Choose whole grains. Look for the word "whole" as the first word in the ingredient list.  Choose fish and skinless chicken or turkey more often than red meat. Limit fish, poultry, and meat to 6 oz (170 g) each day.  Limit sweets, desserts, sugars, and sugary drinks.  Choose heart-healthy fats.  Eat more home-cooked food and less restaurant, buffet, and fast food.  Limit fried foods.  Do not fry foods. Cook foods using methods such as baking, boiling, grilling, and broiling instead.  When eating at a restaurant, ask that your food be prepared with less salt, or no salt if possible. What foods can I eat? Seek help from a dietitian for individual calorie needs. Grains  Whole grain or whole wheat bread. Brown rice. Whole grain or whole wheat pasta. Quinoa, bulgur, and whole grain cereals. Low-sodium cereals. Corn or whole wheat flour tortillas. Whole grain cornbread. Whole grain crackers. Low-sodium crackers. Vegetables  Fresh or frozen vegetables (raw, steamed, roasted, or  grilled). Low-sodium or reduced-sodium tomato and vegetable juices. Low-sodium or reduced-sodium tomato sauce and paste. Low-sodium or reduced-sodium canned vegetables. Fruits  All fresh, canned (in natural juice), or frozen fruits. Meat and Other Protein Products  Ground beef (85% or leaner), grass-fed beef, or beef trimmed of fat. Skinless chicken or turkey. Ground chicken or turkey. Pork trimmed of fat. All fish and seafood. Eggs. Dried beans, peas, or lentils. Unsalted nuts and seeds. Unsalted canned beans. Dairy  Low-fat dairy products, such as skim or 1% milk, 2% or reduced-fat cheeses, low-fat ricotta or cottage cheese, or plain low-fat yogurt. Low-sodium or reduced-sodium cheeses. Fats and Oils  Tub margarines without trans fats. Light or reduced-fat mayonnaise and salad dressings (reduced sodium). Avocado. Safflower, olive, or canola oils. Natural peanut or almond butter. Other  Unsalted popcorn and pretzels. The items listed above may not be a complete list of recommended foods or beverages. Contact your dietitian for more options.  What foods are not recommended? Grains  White bread. White pasta. White rice. Refined cornbread. Bagels and croissants. Crackers that contain trans fat. Vegetables  Creamed or fried vegetables. Vegetables in a cheese sauce. Regular canned vegetables. Regular canned tomato sauce and paste. Regular tomato and vegetable juices. Fruits  Canned fruit in light or heavy syrup. Fruit juice. Meat and Other Protein Products  Fatty cuts of meat. Ribs, chicken wings, bacon, sausage, bologna, salami, chitterlings, fatback, hot dogs, bratwurst, and packaged luncheon meats. Salted nuts and seeds. Canned beans with salt. Dairy  Whole or 2% milk, cream, half-and-half, and cream cheese. Whole-fat or sweetened yogurt. Full-fat cheeses   or blue cheese. Nondairy creamers and whipped toppings. Processed cheese, cheese spreads, or cheese curds. Condiments  Onion and garlic  salt, seasoned salt, table salt, and sea salt. Canned and packaged gravies. Worcestershire sauce. Tartar sauce. Barbecue sauce. Teriyaki sauce. Soy sauce, including reduced sodium. Steak sauce. Fish sauce. Oyster sauce. Cocktail sauce. Horseradish. Ketchup and mustard. Meat flavorings and tenderizers. Bouillon cubes. Hot sauce. Tabasco sauce. Marinades. Taco seasonings. Relishes. Fats and Oils  Butter, stick margarine, lard, shortening, ghee, and bacon fat. Coconut, palm kernel, or palm oils. Regular salad dressings. Other  Pickles and olives. Salted popcorn and pretzels. The items listed above may not be a complete list of foods and beverages to avoid. Contact your dietitian for more information.  Where can I find more information? National Heart, Lung, and Blood Institute: travelstabloid.com This information is not intended to replace advice given to you by your health care provider. Make sure you discuss any questions you have with your health care provider. Document Released: 04/21/2011 Document Revised: 10/08/2015 Document Reviewed: 03/06/2013 Elsevier Interactive Patient Education  2017 [place] Prevention in the Home Introduction Falls can cause injuries. They can happen to people of all ages. There are many things you can do to make your home safe and to help prevent falls. What can I do on the outside of my home?  Regularly fix the edges of walkways and driveways and fix any cracks.  Remove anything that might make you trip as you walk through a door, such as a raised step or threshold.  Trim any bushes or trees on the path to your home.  Use bright outdoor lighting.  Clear any walking paths of anything that might make someone trip, such as rocks or tools.  Regularly check to see if handrails are loose or broken. Make sure that both sides of any steps have handrails.  Any raised decks and porches should have guardrails on the  edges.  Have any leaves, snow, or ice cleared regularly.  Use sand or salt on walking paths during winter.  Clean up any spills in your garage right away. This includes oil or grease spills. What can I do in the bathroom?  Use night lights.  Install grab bars by the toilet and in the tub and shower. Do not use towel bars as grab bars.  Use non-skid mats or decals in the tub or shower.  If you need to sit down in the shower, use a plastic, non-slip stool.  Keep the floor dry. Clean up any water that spills on the floor as soon as it happens.  Remove soap buildup in the tub or shower regularly.  Attach bath mats securely with double-sided non-slip rug tape.  Do not have throw rugs and other things on the floor that can make you trip. What can I do in the bedroom?  Use night lights.  Make sure that you have a light by your bed that is easy to reach.  Do not use any sheets or blankets that are too big for your bed. They should not hang down onto the floor.  Have a firm chair that has side arms. You can use this for support while you get dressed.  Do not have throw rugs and other things on the floor that can make you trip. What can I do in the kitchen?  Clean up any spills right away.  Avoid walking on wet floors.  Keep items that you use a lot in  easy-to-reach places.  If you need to reach something above you, use a strong step stool that has a grab bar.  Keep electrical cords out of the way.  Do not use floor polish or wax that makes floors slippery. If you must use wax, use non-skid floor wax.  Do not have throw rugs and other things on the floor that can make you trip. What can I do with my stairs?  Do not leave any items on the stairs.  Make sure that there are handrails on both sides of the stairs and use them. Fix handrails that are broken or loose. Make sure that handrails are as long as the stairways.  Check any carpeting to make sure that it is firmly  attached to the stairs. Fix any carpet that is loose or worn.  Avoid having throw rugs at the top or bottom of the stairs. If you do have throw rugs, attach them to the floor with carpet tape.  Make sure that you have a light switch at the top of the stairs and the bottom of the stairs. If you do not have them, ask someone to add them for you. What else can I do to help prevent falls?  Wear shoes that:  Do not have high heels.  Have rubber bottoms.  Are comfortable and fit you well.  Are closed at the toe. Do not wear sandals.  If you use a stepladder:  Make sure that it is fully opened. Do not climb a closed stepladder.  Make sure that both sides of the stepladder are locked into place.  Ask someone to hold it for you, if possible.  Clearly mark and make sure that you can see:  Any grab bars or handrails.  First and last steps.  Where the edge of each step is.  Use tools that help you move around (mobility aids) if they are needed. These include:  Canes.  Walkers.  Scooters.  Crutches.  Turn on the lights when you go into a dark area. Replace any light bulbs as soon as they burn out.  Set up your furniture so you have a clear path. Avoid moving your furniture around.  If any of your floors are uneven, fix them.  If there are any pets around you, be aware of where they are.  Review your medicines with your doctor. Some medicines can make you feel dizzy. This can increase your chance of falling. Ask your doctor what other things that you can do to help prevent falls. This information is not intended to replace advice given to you by your health care provider. Make sure you discuss any questions you have with your health care provider. Document Released: 02/26/2009 Document Revised: 10/08/2015 Document Reviewed: 06/06/2014  2017 Elsevier  Health Maintenance, Male A healthy lifestyle and preventative care can promote health and wellness.  Maintain regular  health, dental, and eye exams.  Eat a healthy diet. Foods like vegetables, fruits, whole grains, low-fat dairy products, and lean protein foods contain the nutrients you need and are low in calories. Decrease your intake of foods high in solid fats, added sugars, and salt. Get information about a proper diet from your health care provider, if necessary.  Regular physical exercise is one of the most important things you can do for your health. Most adults should get at least 150 minutes of moderate-intensity exercise (any activity that increases your heart rate and causes you to sweat) each week. In addition, most adults need  muscle-strengthening exercises on 2 or more days a week.   Maintain a healthy weight. The body mass index (BMI) is a screening tool to identify possible weight problems. It provides an estimate of body fat based on height and weight. Your health care provider can find your BMI and can help you achieve or maintain a healthy weight. For males 20 years and older:  A BMI below 18.5 is considered underweight.  A BMI of 18.5 to 24.9 is normal.  A BMI of 25 to 29.9 is considered overweight.  A BMI of 30 and above is considered obese.  Maintain normal blood lipids and cholesterol by exercising and minimizing your intake of saturated fat. Eat a balanced diet with plenty of fruits and vegetables. Blood tests for lipids and cholesterol should begin at age 15 and be repeated every 5 years. If your lipid or cholesterol levels are high, you are over age 50, or you are at high risk for heart disease, you may need your cholesterol levels checked more frequently.Ongoing high lipid and cholesterol levels should be treated with medicines if diet and exercise are not working.  If you smoke, find out from your health care provider how to quit. If you do not use tobacco, do not start.  Lung cancer screening is recommended for adults aged 69-80 years who are at high risk for developing lung  cancer because of a history of smoking. A yearly low-dose CT scan of the lungs is recommended for people who have at least a 30-pack-year history of smoking and are current smokers or have quit within the past 15 years. A pack year of smoking is smoking an average of 1 pack of cigarettes a day for 1 year (for example, a 30-pack-year history of smoking could mean smoking 1 pack a day for 30 years or 2 packs a day for 15 years). Yearly screening should continue until the smoker has stopped smoking for at least 15 years. Yearly screening should be stopped for people who develop a health problem that would prevent them from having lung cancer treatment.  If you choose to drink alcohol, do not have more than 2 drinks per day. One drink is considered to be 12 oz (360 mL) of beer, 5 oz (150 mL) of wine, or 1.5 oz (45 mL) of liquor.  Avoid the use of street drugs. Do not share needles with anyone. Ask for help if you need support or instructions about stopping the use of drugs.  High blood pressure causes heart disease and increases the risk of stroke. High blood pressure is more likely to develop in:  People who have blood pressure in the end of the normal range (100-139/85-89 mm Hg).  People who are overweight or obese.  People who are African American.  If you are 14-40 years of age, have your blood pressure checked every 3-5 years. If you are 83 years of age or older, have your blood pressure checked every year. You should have your blood pressure measured twice-once when you are at a hospital or clinic, and once when you are not at a hospital or clinic. Record the average of the two measurements. To check your blood pressure when you are not at a hospital or clinic, you can use:  An automated blood pressure machine at a pharmacy.  A home blood pressure monitor.  If you are 8-35 years old, ask your health care provider if you should take aspirin to prevent heart disease.  Diabetes screening  involves  taking a blood sample to check your fasting blood sugar level. This should be done once every 3 years after age 27 if you are at a normal weight and without risk factors for diabetes. Testing should be considered at a younger age or be carried out more frequently if you are overweight and have at least 1 risk factor for diabetes.  Colorectal cancer can be detected and often prevented. Most routine colorectal cancer screening begins at the age of 69 and continues through age 90. However, your health care provider may recommend screening at an earlier age if you have risk factors for colon cancer. On a yearly basis, your health care provider may provide home test kits to check for hidden blood in the stool. A small camera at the end of a tube may be used to directly examine the colon (sigmoidoscopy or colonoscopy) to detect the earliest forms of colorectal cancer. Talk to your health care provider about this at age 65 when routine screening begins. A direct exam of the colon should be repeated every 5-10 years through age 33, unless early forms of precancerous polyps or small growths are found.  People who are at an increased risk for hepatitis B should be screened for this virus. You are considered at high risk for hepatitis B if:  You were born in a country where hepatitis B occurs often. Talk with your health care provider about which countries are considered high risk.  Your parents were born in a high-risk country and you have not received a shot to protect against hepatitis B (hepatitis B vaccine).  You have HIV or AIDS.  You use needles to inject street drugs.  You live with, or have sex with, someone who has hepatitis B.  You are a man who has sex with other men (MSM).  You get hemodialysis treatment.  You take certain medicines for conditions like cancer, organ transplantation, and autoimmune conditions.  Hepatitis C blood testing is recommended for all people born from [birth year]  through 1965 and any individual with known risk factors for hepatitis C.  Healthy men should no longer receive prostate-specific antigen (PSA) blood tests as part of routine cancer screening. Talk to your health care provider about prostate cancer screening.  Testicular cancer screening is not recommended for adolescents or adult males who have no symptoms. Screening includes self-exam, a health care provider exam, and other screening tests. Consult with your health care provider about any symptoms you have or any concerns you have about testicular cancer.  Practice safe sex. Use condoms and avoid high-risk sexual practices to reduce the spread of sexually transmitted infections (STIs).  You should be screened for STIs, including gonorrhea and chlamydia if:  You are sexually active and are younger than 24 years.  You are older than 24 years, and your health care provider tells you that you are at risk for this type of infection.  Your sexual activity has changed since you were last screened, and you are at an increased risk for chlamydia or gonorrhea. Ask your health care provider if you are at risk.  If you are at risk of being infected with HIV, it is recommended that you take a prescription medicine daily to prevent HIV infection. This is called pre-exposure prophylaxis (PrEP). You are considered at risk if:  You are a man who has sex with other men (MSM).  You are a heterosexual man who is sexually active with multiple partners.  You take drugs by injection.  You are sexually active with a partner who has HIV.  Talk with your health care provider about whether you are at high risk of being infected with HIV. If you choose to begin PrEP, you should first be tested for HIV. You should then be tested every 3 months for as long as you are taking PrEP.  Use sunscreen. Apply sunscreen liberally and repeatedly throughout the day. You should seek shade when your shadow is shorter than you.  Protect yourself by wearing long sleeves, pants, a wide-brimmed hat, and sunglasses year round whenever you are outdoors.  Tell your health care provider of new moles or changes in moles, especially if there is a change in shape or color. Also, tell your health care provider if a mole is larger than the size of a pencil eraser.  A one-time screening for abdominal aortic aneurysm (AAA) and surgical repair of large AAAs by ultrasound is recommended for men aged 16-75 years who are current or former smokers.  Stay current with your vaccines (immunizations). This information is not intended to replace advice given to you by your health care provider. Make sure you discuss any questions you have with your health care provider. Document Released: 10/29/2007 Document Revised: 05/23/2014 Document Reviewed: 02/03/2015 Elsevier Interactive Patient Education  2017 [employer].

## 2016-05-05 ENCOUNTER — Encounter: Payer: Self-pay | Admitting: *Deleted

## 2016-05-05 NOTE — Progress Notes (Signed)
I have reviewed this visit and discussed with Howell Rucks, RN, BSN, and agree with her documentation.   Donald Blare, DO 05/05/2016, 8:43 AM PGY-3, [place]

## 2016-05-06 ENCOUNTER — Ambulatory Visit (HOSPITAL_COMMUNITY): Payer: Medicare Other

## 2016-05-13 ENCOUNTER — Ambulatory Visit (HOSPITAL_COMMUNITY)
Admission: RE | Admit: 2016-05-13 | Discharge: 2016-05-13 | Disposition: A | Discharge status: Home / Self Care | Payer: Medicare Other | Source: Ambulatory Visit | Attending: Gastroenterology | Admitting: Gastroenterology

## 2016-05-13 DIAGNOSIS — K7460 Unspecified cirrhosis of liver: Secondary | ICD-10-CM | POA: Insufficient documentation

## 2016-05-13 DIAGNOSIS — R188 Other ascites: Secondary | ICD-10-CM

## 2016-05-13 MED ORDER — LIDOCAINE HCL (PF) 1 % IJ SOLN
INTRAMUSCULAR | Status: AC
  Filled 2016-05-13: qty 10

## 2016-05-13 MED ORDER — ALBUMIN HUMAN 25 % IV SOLN
50.0000 g | Freq: Once | INTRAVENOUS | Status: AC
  Administered 2016-05-13: 50 g via INTRAVENOUS
  Filled 2016-05-13: qty 200

## 2016-05-13 NOTE — Procedures (Signed)
   US guided LLQ paracentesis 7 L maximum collected  Tolerated well  Post procedure IV Albumin 50 gr per MD Sent for cytology per MD

## 2016-05-17 ENCOUNTER — Other Ambulatory Visit (HOSPITAL_COMMUNITY): Payer: Self-pay | Admitting: Gastroenterology

## 2016-05-17 DIAGNOSIS — R188 Other ascites: Secondary | ICD-10-CM

## 2016-05-17 DIAGNOSIS — K7460 Unspecified cirrhosis of liver: Secondary | ICD-10-CM

## 2016-05-20 ENCOUNTER — Ambulatory Visit (HOSPITAL_COMMUNITY)
Admission: RE | Admit: 2016-05-20 | Discharge: 2016-05-20 | Disposition: A | Discharge status: Home / Self Care | Payer: Medicare Other | Source: Ambulatory Visit | Attending: Gastroenterology | Admitting: Gastroenterology

## 2016-05-20 DIAGNOSIS — K7031 Alcoholic cirrhosis of liver with ascites: Secondary | ICD-10-CM | POA: Diagnosis not present

## 2016-05-20 DIAGNOSIS — R188 Other ascites: Secondary | ICD-10-CM

## 2016-05-20 DIAGNOSIS — K7460 Unspecified cirrhosis of liver: Secondary | ICD-10-CM

## 2016-05-20 MED ORDER — LIDOCAINE HCL (PF) 1 % IJ SOLN
INTRAMUSCULAR | Status: AC
  Filled 2016-05-20: qty 10

## 2016-05-20 MED ORDER — ALBUMIN HUMAN 25 % IV SOLN
50.0000 g | Freq: Once | INTRAVENOUS | Status: AC
  Administered 2016-05-20: 50 g via INTRAVENOUS
  Filled 2016-05-20: qty 200

## 2016-05-20 NOTE — Procedures (Signed)
Successful US guided paracentesis from left lateral abdomen.  Yielded 7 liters of clear yellow fluid.  No immediate complications.  Pt tolerated well.   Specimen was sent for labs.  Peretz Thieme S Anwitha Mapes PA-C 05/20/2016 12:08 PM

## 2016-05-27 ENCOUNTER — Ambulatory Visit (HOSPITAL_COMMUNITY)
Admission: RE | Admit: 2016-05-27 | Discharge: 2016-05-27 | Disposition: A | Discharge status: Home / Self Care | Payer: Medicare Other | Source: Ambulatory Visit | Attending: Gastroenterology | Admitting: Gastroenterology

## 2016-05-27 DIAGNOSIS — R188 Other ascites: Secondary | ICD-10-CM

## 2016-05-27 DIAGNOSIS — K7460 Unspecified cirrhosis of liver: Secondary | ICD-10-CM

## 2016-05-27 DIAGNOSIS — K7031 Alcoholic cirrhosis of liver with ascites: Secondary | ICD-10-CM | POA: Diagnosis not present

## 2016-05-27 MED ORDER — LIDOCAINE HCL 1 % IJ SOLN
INTRAMUSCULAR | Status: AC
  Filled 2016-05-27: qty 20

## 2016-05-27 MED ORDER — ALBUMIN HUMAN 25 % IV SOLN
50.0000 g | Freq: Once | INTRAVENOUS | Status: AC
  Administered 2016-05-27: 50 g via INTRAVENOUS
  Filled 2016-05-27: qty 200

## 2016-05-27 NOTE — Procedures (Signed)
Ultrasound-guided diagnostic and therapeutic paracentesis performed yielding 7 liters of yellow colored fluid. No immediate complications. 7L was max and he will receive 50g of albumin as well.  Jensen Kilburg E 10:46 AM 05/27/2016

## 2016-06-03 ENCOUNTER — Ambulatory Visit (HOSPITAL_COMMUNITY)
Admission: RE | Admit: 2016-06-03 | Discharge: 2016-06-03 | Disposition: A | Discharge status: Home / Self Care | Payer: Medicare Other | Source: Ambulatory Visit | Attending: Gastroenterology | Admitting: Gastroenterology

## 2016-06-03 DIAGNOSIS — K7460 Unspecified cirrhosis of liver: Secondary | ICD-10-CM | POA: Insufficient documentation

## 2016-06-03 DIAGNOSIS — R188 Other ascites: Secondary | ICD-10-CM | POA: Insufficient documentation

## 2016-06-03 MED ORDER — LIDOCAINE HCL (PF) 1 % IJ SOLN
INTRAMUSCULAR | Status: AC
  Filled 2016-06-03: qty 10

## 2016-06-03 MED ORDER — ALBUMIN HUMAN 25 % IV SOLN
50.0000 g | Freq: Once | INTRAVENOUS | Status: AC
  Administered 2016-06-03: 50 g via INTRAVENOUS
  Filled 2016-06-03: qty 200

## 2016-06-03 NOTE — Procedures (Signed)
PROCEDURE SUMMARY:  Successful US guided paracentesis from RLQ.  Yielded 7L of clear yellow fluid.  No immediate complications.  Pt tolerated well.   Specimen was sent for labs.  Ascencion Dike PA-C 06/03/2016 11:05 AM

## 2016-06-10 ENCOUNTER — Ambulatory Visit (HOSPITAL_COMMUNITY)
Admission: RE | Admit: 2016-06-10 | Discharge: 2016-06-10 | Disposition: A | Discharge status: Home / Self Care | Payer: Medicare Other | Source: Ambulatory Visit | Attending: Gastroenterology | Admitting: Gastroenterology

## 2016-06-10 DIAGNOSIS — D199 Benign neoplasm of mesothelial tissue, unspecified: Secondary | ICD-10-CM | POA: Insufficient documentation

## 2016-06-10 DIAGNOSIS — R188 Other ascites: Secondary | ICD-10-CM | POA: Diagnosis not present

## 2016-06-10 DIAGNOSIS — K7460 Unspecified cirrhosis of liver: Secondary | ICD-10-CM | POA: Insufficient documentation

## 2016-06-10 MED ORDER — ALBUMIN HUMAN 25 % IV SOLN
50.0000 g | Freq: Once | INTRAVENOUS | Status: AC
  Administered 2016-06-10: 50 g via INTRAVENOUS
  Filled 2016-06-10: qty 200

## 2016-06-10 MED ORDER — LIDOCAINE HCL (PF) 1 % IJ SOLN
INTRAMUSCULAR | Status: AC
  Filled 2016-06-10: qty 10

## 2016-06-10 NOTE — Procedures (Signed)
  PROCEDURE SUMMARY:  Successful US guided paracentesis from RLQ.  Yielded 7 L of clear yellow fluid.  No immediate complications.  Pt tolerated well.   Specimen was sent for labs.  Ascencion Dike PA-C 06/10/2016 10:57 AM

## 2016-06-17 ENCOUNTER — Ambulatory Visit (HOSPITAL_COMMUNITY)
Admission: RE | Admit: 2016-06-17 | Discharge: 2016-06-17 | Disposition: A | Discharge status: Home / Self Care | Payer: Medicare Other | Source: Ambulatory Visit | Attending: Gastroenterology | Admitting: Gastroenterology

## 2016-06-17 ENCOUNTER — Other Ambulatory Visit: Payer: Self-pay | Admitting: Obstetrics and Gynecology

## 2016-06-17 DIAGNOSIS — R188 Other ascites: Secondary | ICD-10-CM

## 2016-06-17 DIAGNOSIS — I483 Typical atrial flutter: Secondary | ICD-10-CM | POA: Diagnosis not present

## 2016-06-17 DIAGNOSIS — Z825 Family history of asthma and other chronic lower respiratory diseases: Secondary | ICD-10-CM | POA: Diagnosis not present

## 2016-06-17 DIAGNOSIS — Z79899 Other long term (current) drug therapy: Secondary | ICD-10-CM | POA: Diagnosis not present

## 2016-06-17 DIAGNOSIS — M879 Osteonecrosis, unspecified: Secondary | ICD-10-CM | POA: Diagnosis not present

## 2016-06-17 DIAGNOSIS — M109 Gout, unspecified: Secondary | ICD-10-CM | POA: Diagnosis not present

## 2016-06-17 DIAGNOSIS — K7031 Alcoholic cirrhosis of liver with ascites: Secondary | ICD-10-CM | POA: Insufficient documentation

## 2016-06-17 DIAGNOSIS — N183 Chronic kidney disease, stage 3 (moderate): Secondary | ICD-10-CM | POA: Diagnosis not present

## 2016-06-17 DIAGNOSIS — K8020 Calculus of gallbladder without cholecystitis without obstruction: Secondary | ICD-10-CM | POA: Diagnosis not present

## 2016-06-17 DIAGNOSIS — J45909 Unspecified asthma, uncomplicated: Secondary | ICD-10-CM | POA: Diagnosis not present

## 2016-06-17 DIAGNOSIS — Z8249 Family history of ischemic heart disease and other diseases of the circulatory system: Secondary | ICD-10-CM | POA: Diagnosis not present

## 2016-06-17 DIAGNOSIS — I129 Hypertensive chronic kidney disease with stage 1 through stage 4 chronic kidney disease, or unspecified chronic kidney disease: Secondary | ICD-10-CM | POA: Diagnosis not present

## 2016-06-17 DIAGNOSIS — K7460 Unspecified cirrhosis of liver: Secondary | ICD-10-CM

## 2016-06-17 DIAGNOSIS — K439 Ventral hernia without obstruction or gangrene: Secondary | ICD-10-CM | POA: Diagnosis not present

## 2016-06-17 DIAGNOSIS — K56600 Partial intestinal obstruction, unspecified as to cause: Secondary | ICD-10-CM | POA: Diagnosis not present

## 2016-06-17 DIAGNOSIS — R1032 Left lower quadrant pain: Secondary | ICD-10-CM | POA: Diagnosis not present

## 2016-06-17 DIAGNOSIS — R001 Bradycardia, unspecified: Secondary | ICD-10-CM | POA: Diagnosis not present

## 2016-06-17 DIAGNOSIS — Z66 Do not resuscitate: Secondary | ICD-10-CM | POA: Diagnosis not present

## 2016-06-17 DIAGNOSIS — E43 Unspecified severe protein-calorie malnutrition: Secondary | ICD-10-CM | POA: Diagnosis not present

## 2016-06-17 DIAGNOSIS — K429 Umbilical hernia without obstruction or gangrene: Secondary | ICD-10-CM | POA: Diagnosis not present

## 2016-06-17 DIAGNOSIS — Z6820 Body mass index (BMI) 20.0-20.9, adult: Secondary | ICD-10-CM | POA: Diagnosis not present

## 2016-06-17 MED ORDER — ALBUMIN HUMAN 25 % IV SOLN
50.0000 g | Freq: Once | INTRAVENOUS | Status: AC
  Administered 2016-06-17: 50 g via INTRAVENOUS
  Filled 2016-06-17: qty 200

## 2016-06-17 MED ORDER — LIDOCAINE HCL (PF) 1 % IJ SOLN
INTRAMUSCULAR | Status: AC
  Filled 2016-06-17: qty 10

## 2016-06-17 NOTE — Procedures (Addendum)
Ultrasound-guided diagnostic and therapeutic paracentesis performed yielding 6.4 liters  of slightly hazy, yellow  fluid. No immediate complications. A portion of the fluid was sent to the lab for preordered studies. The pt will receive IV albumin postprocedure.

## 2016-06-19 ENCOUNTER — Emergency Department (HOSPITAL_COMMUNITY): Payer: Medicare Other

## 2016-06-19 ENCOUNTER — Encounter (HOSPITAL_COMMUNITY): Payer: Self-pay

## 2016-06-19 ENCOUNTER — Inpatient Hospital Stay (HOSPITAL_COMMUNITY)
Admission: EM | Admit: 2016-06-19 | Discharge: 2016-06-20 | DRG: 388 | Disposition: A | Discharge status: Home / Self Care | Payer: Medicare Other | Attending: Family Medicine | Admitting: Family Medicine

## 2016-06-19 DIAGNOSIS — E43 Unspecified severe protein-calorie malnutrition: Secondary | ICD-10-CM | POA: Diagnosis present

## 2016-06-19 DIAGNOSIS — Z8249 Family history of ischemic heart disease and other diseases of the circulatory system: Secondary | ICD-10-CM

## 2016-06-19 DIAGNOSIS — Z79899 Other long term (current) drug therapy: Secondary | ICD-10-CM | POA: Diagnosis not present

## 2016-06-19 DIAGNOSIS — K56600 Partial intestinal obstruction, unspecified as to cause: Secondary | ICD-10-CM | POA: Diagnosis not present

## 2016-06-19 DIAGNOSIS — F17220 Nicotine dependence, chewing tobacco, uncomplicated: Secondary | ICD-10-CM | POA: Diagnosis present

## 2016-06-19 DIAGNOSIS — K8020 Calculus of gallbladder without cholecystitis without obstruction: Secondary | ICD-10-CM | POA: Diagnosis present

## 2016-06-19 DIAGNOSIS — J45909 Unspecified asthma, uncomplicated: Secondary | ICD-10-CM | POA: Diagnosis present

## 2016-06-19 DIAGNOSIS — Z6820 Body mass index (BMI) 20.0-20.9, adult: Secondary | ICD-10-CM | POA: Diagnosis not present

## 2016-06-19 DIAGNOSIS — N183 Chronic kidney disease, stage 3 (moderate): Secondary | ICD-10-CM | POA: Diagnosis present

## 2016-06-19 DIAGNOSIS — I483 Typical atrial flutter: Secondary | ICD-10-CM | POA: Diagnosis present

## 2016-06-19 DIAGNOSIS — K7031 Alcoholic cirrhosis of liver with ascites: Secondary | ICD-10-CM | POA: Diagnosis present

## 2016-06-19 DIAGNOSIS — I129 Hypertensive chronic kidney disease with stage 1 through stage 4 chronic kidney disease, or unspecified chronic kidney disease: Secondary | ICD-10-CM | POA: Diagnosis present

## 2016-06-19 DIAGNOSIS — R001 Bradycardia, unspecified: Secondary | ICD-10-CM | POA: Diagnosis not present

## 2016-06-19 DIAGNOSIS — K7030 Alcoholic cirrhosis of liver without ascites: Secondary | ICD-10-CM | POA: Diagnosis present

## 2016-06-19 DIAGNOSIS — Z66 Do not resuscitate: Secondary | ICD-10-CM | POA: Diagnosis present

## 2016-06-19 DIAGNOSIS — M109 Gout, unspecified: Secondary | ICD-10-CM | POA: Diagnosis present

## 2016-06-19 DIAGNOSIS — K429 Umbilical hernia without obstruction or gangrene: Secondary | ICD-10-CM | POA: Diagnosis present

## 2016-06-19 DIAGNOSIS — K439 Ventral hernia without obstruction or gangrene: Secondary | ICD-10-CM | POA: Diagnosis present

## 2016-06-19 DIAGNOSIS — M879 Osteonecrosis, unspecified: Secondary | ICD-10-CM | POA: Diagnosis present

## 2016-06-19 DIAGNOSIS — Z825 Family history of asthma and other chronic lower respiratory diseases: Secondary | ICD-10-CM

## 2016-06-19 DIAGNOSIS — R1084 Generalized abdominal pain: Secondary | ICD-10-CM | POA: Diagnosis not present

## 2016-06-19 DIAGNOSIS — I498 Other specified cardiac arrhythmias: Secondary | ICD-10-CM

## 2016-06-19 DIAGNOSIS — R1032 Left lower quadrant pain: Secondary | ICD-10-CM | POA: Diagnosis not present

## 2016-06-19 LAB — CBC
HCT: 38.5 % — ABNORMAL LOW (ref 39.0–52.0)
Hemoglobin: 13.1 g/dL (ref 13.0–17.0)
MCH: 34.3 pg — ABNORMAL HIGH (ref 26.0–34.0)
MCHC: 34 g/dL (ref 30.0–36.0)
MCV: 100.8 fL — ABNORMAL HIGH (ref 78.0–100.0)
PLATELETS: 125 10*3/uL — AB (ref 150–400)
RBC: 3.82 MIL/uL — ABNORMAL LOW (ref 4.22–5.81)
RDW: 14.1 % (ref 11.5–15.5)
WBC: 6.9 10*3/uL (ref 4.0–10.5)

## 2016-06-19 LAB — URINALYSIS, ROUTINE W REFLEX MICROSCOPIC
BILIRUBIN URINE: NEGATIVE
Glucose, UA: NEGATIVE mg/dL
Hgb urine dipstick: NEGATIVE
KETONES UR: NEGATIVE mg/dL
LEUKOCYTES UA: NEGATIVE
NITRITE: NEGATIVE
PROTEIN: NEGATIVE mg/dL
Specific Gravity, Urine: 1.02 (ref 1.005–1.030)
pH: 5 (ref 5.0–8.0)

## 2016-06-19 LAB — COMPREHENSIVE METABOLIC PANEL
ALK PHOS: 80 U/L (ref 38–126)
ALT: 13 U/L — AB (ref 17–63)
AST: 24 U/L (ref 15–41)
Albumin: 3.7 g/dL (ref 3.5–5.0)
Anion gap: 7 (ref 5–15)
BILIRUBIN TOTAL: 0.8 mg/dL (ref 0.3–1.2)
BUN: 21 mg/dL — AB (ref 6–20)
CALCIUM: 9 mg/dL (ref 8.9–10.3)
CO2: 25 mmol/L (ref 22–32)
CREATININE: 1.87 mg/dL — AB (ref 0.61–1.24)
Chloride: 108 mmol/L (ref 101–111)
GFR calc Af Amer: 39 mL/min — ABNORMAL LOW (ref >60)
GFR, EST NON AFRICAN AMERICAN: 34 mL/min — AB (ref >60)
Glucose, Bld: 95 mg/dL (ref 65–99)
POTASSIUM: 4.6 mmol/L (ref 3.5–5.1)
Sodium: 140 mmol/L (ref 135–145)
TOTAL PROTEIN: 6.6 g/dL (ref 6.5–8.1)

## 2016-06-19 LAB — LIPASE, BLOOD: Lipase: 29 U/L (ref 11–51)

## 2016-06-19 MED ORDER — ENSURE ENLIVE PO LIQD
237.0000 mL | Freq: Every day | ORAL | Status: DC
  Administered 2016-06-20: 237 mL via ORAL

## 2016-06-19 MED ORDER — SODIUM CHLORIDE 0.9% FLUSH: 3.0000 mL | INTRAVENOUS | Status: DC | PRN

## 2016-06-19 MED ORDER — MOMETASONE FURO-FORMOTEROL FUM 200-5 MCG/ACT IN AERO
2.0000 | INHALATION_SPRAY | Freq: Two times a day (BID) | RESPIRATORY_TRACT | Status: DC
  Administered 2016-06-19 – 2016-06-20 (×2): 2 via RESPIRATORY_TRACT
  Filled 2016-06-19: qty 8.8

## 2016-06-19 MED ORDER — SODIUM CHLORIDE 0.9 % IV SOLN: 250.0000 mL | INTRAVENOUS | Status: DC | PRN

## 2016-06-19 MED ORDER — ALLOPURINOL 300 MG PO TABS
300.0000 mg | ORAL_TABLET | Freq: Every day | ORAL | Status: DC
  Administered 2016-06-20: 300 mg via ORAL
  Filled 2016-06-19: qty 1

## 2016-06-19 MED ORDER — MORPHINE SULFATE (PF) 4 MG/ML IV SOLN
4.0000 mg | Freq: Once | INTRAVENOUS | Status: AC
  Administered 2016-06-19: 4 mg via INTRAVENOUS
  Filled 2016-06-19: qty 1

## 2016-06-19 MED ORDER — FERROUS SULFATE 325 (65 FE) MG PO TABS
325.0000 mg | ORAL_TABLET | Freq: Two times a day (BID) | ORAL | Status: DC
  Administered 2016-06-20: 325 mg via ORAL
  Filled 2016-06-19: qty 1

## 2016-06-19 MED ORDER — ALBUTEROL SULFATE (2.5 MG/3ML) 0.083% IN NEBU: 3.0000 mL | INHALATION_SOLUTION | RESPIRATORY_TRACT | Status: DC | PRN

## 2016-06-19 MED ORDER — IOPAMIDOL (ISOVUE-300) INJECTION 61%
INTRAVENOUS | Status: AC
  Filled 2016-06-19: qty 30

## 2016-06-19 MED ORDER — VITAMIN D 1000 UNITS PO TABS
1000.0000 [IU] | ORAL_TABLET | Freq: Every day | ORAL | Status: DC
  Administered 2016-06-20: 1000 [IU] via ORAL
  Filled 2016-06-19: qty 1

## 2016-06-19 MED ORDER — SODIUM CHLORIDE 0.9% FLUSH: 3.0000 mL | Freq: Two times a day (BID) | INTRAVENOUS | Status: DC

## 2016-06-19 MED ORDER — SODIUM CHLORIDE 0.9% FLUSH
3.0000 mL | Freq: Two times a day (BID) | INTRAVENOUS | Status: DC
  Administered 2016-06-19: 3 mL via INTRAVENOUS

## 2016-06-19 MED ORDER — SODIUM CHLORIDE 0.9 % IV SOLN
INTRAVENOUS | Status: AC
  Administered 2016-06-20: via INTRAVENOUS

## 2016-06-19 MED ORDER — MONTELUKAST SODIUM 10 MG PO TABS: 10.0000 mg | ORAL_TABLET | Freq: Every day | ORAL | Status: DC

## 2016-06-19 NOTE — Progress Notes (Signed)
Attending Brief Admission Note  Patient seen and examined at around 2pm. Briefly, 76 y.o. male with history of alcoholic cirrhosis with recurrent ascite (getting weekly paracentesis), and known umbilical hernia presenting with abdominal pain. Initial presentation in the ED concerning for incarcerated umbilical hernia, however CT instead shows partial or early SBO. General surgery consulted and has seen patient, recommends bowel rest.  My exam shows very soft, nontender abdomen. Umbilical hernia present, which is easily reducible. + fluid wave. Patient otherwise well appearing.  Plan is to keep NPO and observe for continued improvement. I suspect he may have in fact have had an incarcerated hernia, based on his reported history and the ED provider's exam. This likely spontaneously reduced.  Consider another paracentesis to drain more ascites (last tap 2 days ago), though I don't think this is necessary right now. No concern for SBP on exam, as belly is nontender and patient is afebrile. Low threshold to add SBP antibiotic coverage if any clinical worsening.  Will cosign resident H&P when it is available.  Chrisandra Netters, MD [place]

## 2016-06-19 NOTE — Progress Notes (Signed)
Pt heart has been in the 30s to 40s. MD notified.

## 2016-06-19 NOTE — ED Notes (Signed)
Contrast at bedside

## 2016-06-19 NOTE — ED Provider Notes (Signed)
[facility] DEPT Provider Note   CSN: [id number] Arrival date & time: 06/19/16  0223     History   Chief Complaint Chief Complaint  Patient presents with  . Abdominal Pain    HPI Donald Moreno is a 76 y.o. male.  The patient presents with complaint of abdominal pain across the middle abdomen, more tender at the base of large, protruding umbilical hernia since yesterday. No nausea or vomiting. No change to bowel movements. No melena or hematochezia. He has a history of cirrhosis with weekly paracentesis performed in the outpatient setting, which was done Friday (05/17/16). He does not feel current symptoms indicate the need for additional fluid removal. "This feels different". He does state that the umbilical hernia is usually flat after paracentesis "like a pancake" but he states it is swollen and tender.    The history is provided by the patient and a relative. No language interpreter was used.  Abdominal Pain   Pertinent negatives include fever, diarrhea, nausea and vomiting.    Past Medical History:  Diagnosis Date  . Arthritis   . Asthma   . ETOH abuse   . Gout   . Hypertension   . Liver cirrhosis ([place]) 2015  . Spontaneous bacterial peritonitis ([place]) 07/01/2015    Patient Active Problem List   Diagnosis Date Noted  . Advance care planning 08/01/2015  . Alcoholic cirrhosis of liver with ascites ([place])   . Typical atrial flutter ([place])   . Protein-calorie malnutrition, severe 07/02/2015  . CKD (chronic kidney disease), stage III 05/27/2015  . History of GI bleed 03/04/2015  . At risk for decreased bone density 03/04/2015  . Weight loss, unintentional 03/04/2015  . Inguinal hernia 10/28/2014  . Preventative health care 11/11/2011  . Asthma 01/29/2010  . NICOTINE ADDICTION 04/30/2007  . Gout 07/13/2006  . HYPERTENSION, BENIGN SYSTEMIC 07/13/2006  . Arthropathy 07/13/2006    History reviewed. No pertinent surgical history.     Home Medications    Prior to  Admission medications   Medication Sig Start Date End Date Taking? Authorizing Provider  ADVAIR DISKUS 500-50 MCG/DOSE AEPB USE 1 PUFF TWO TIMES DAILY 02/23/16   Katheren Shams, DO  allopurinol (ZYLOPRIM) 300 MG tablet Take 1 tablet by mouth  daily Patient not taking: Reported on 05/04/2016 01/14/16   Katheren Shams, DO  amiodarone (PACERONE) 200 MG tablet Take 1 tablet (200 mg total) by mouth 4 (four) times a week. Tuesdays, Thursdays, Saturdays, and Sundays 10/28/15   Mihai Croitoru, MD  Cholecalciferol (VITAMIN D3) 1000 UNITS CAPS Take 1 capsule (1,000 Units total) by mouth daily. 03/05/15   Katheren Shams, DO  ferrous sulfate 325 (65 FE) MG tablet Take 1 tablet (325 mg total) by mouth 2 (two) times daily with a meal. Patient taking differently: Take 325 mg by mouth daily with breakfast.  10/21/14   Janora Norlander, DO  montelukast (SINGULAIR) 10 MG tablet Take 1 tablet (10 mg total) by mouth at bedtime. 09/23/15   Katheren Shams, DO  Nutritional Supplements (ENSURE COMPLETE SHAKE) LIQD Take 1 Container by mouth daily. 03/04/15   Katheren Shams, DO  PROAIR HFA 108 [id number] Base) MCG/ACT inhaler Use 2 puffs every 4 hours  as needed for wheezing or  shortness of breath 11/26/15   Veatrice Bourbon, MD    Family History Family History  Problem Relation Age of Onset  . Other Mother     died when pt was only 87 - unknown cause.  Marland Kitchen  Other Father     deceased.  . Asthma Sister   . Heart disease Sister     multiple stents.  . Asthma Brother   . Hypertension Brother   . Asthma Son   . Asthma Brother   . Hypertension Brother     Social History Social History  Substance Use Topics  . Smoking status: Never Smoker  . Smokeless tobacco: Current User    Types: Snuff  . Alcohol use No     Comment: quit drinking about 2 years ago     Allergies   Patient has no known allergies.   Review of Systems Review of Systems  Constitutional: Negative for chills, fever and unexpected weight change.  HENT:  Negative.   Respiratory: Negative.   Cardiovascular: Negative.   Gastrointestinal: Positive for abdominal pain. Negative for blood in stool, diarrhea, nausea and vomiting.  Musculoskeletal: Negative.   Skin: Negative.   Neurological: Negative.      Physical Exam Updated Vital Signs BP 142/84   Pulse (!) 39   Temp 98.5 F (36.9 C) (Oral)   Resp 17   Ht 5' 10.5" (1.791 m)   Wt 65.3 kg   SpO2 98%   BMI 20.37 kg/m   Physical Exam  Constitutional: He is oriented to person, place, and time. He appears well-developed and well-nourished.  HENT:  Head: Normocephalic.  Neck: Normal range of motion.  Cardiovascular: Bradycardia present.   Pulmonary/Chest: Effort normal.  Abdominal: Soft.  Mildly distended but soft abdomen. Large, tender umbilical hernia that is not reducible. Tenderness extends to left periumbilical area.   Musculoskeletal: Normal range of motion.  Neurological: He is alert and oriented to person, place, and time.  Skin: Skin is warm and dry.  Psychiatric: He has a normal mood and affect.     ED Treatments / Results  Labs (all labs ordered are listed, but only abnormal results are displayed) Labs Reviewed  COMPREHENSIVE METABOLIC PANEL - Abnormal; Notable for the following:       Result Value   BUN 21 (*)    Creatinine, Ser 1.87 (*)    ALT 13 (*)    GFR calc non Af Amer 34 (*)    GFR calc Af Amer 39 (*)    All other components within normal limits  CBC - Abnormal; Notable for the following:    RBC 3.82 (*)    HCT 38.5 (*)    MCV 100.8 (*)    MCH 34.3 (*)    Platelets 125 (*)    All other components within normal limits  URINALYSIS, ROUTINE W REFLEX MICROSCOPIC - Abnormal; Notable for the following:    APPearance HAZY (*)    All other components within normal limits  LIPASE, BLOOD    EKG  EKG Interpretation  Date/Time:  Sunday June 19 2016 05:32:44 EST Ventricular Rate:  34 PR Interval:    QRS Duration: 95 QT Interval:  549 QTC  Calculation: 413 R Axis:   39 Text Interpretation:  Junctional rhythm Confirmed by Betsey Holiday  MD, CHRISTOPHER [phone]) on 06/19/2016 6:23:20 AM       Radiology US Paracentesis  Result Date: 06/17/2016 INDICATION: Alcoholic cirrhosis, recurrent ascites. Request made for diagnostic and therapeutic paracentesis up to 7 liters. EXAM: ULTRASOUND GUIDED DIAGNOSTIC AND THERAPEUTIC PARACENTESIS MEDICATIONS: None. COMPLICATIONS: None immediate. PROCEDURE: Informed written consent was obtained from the patient after a discussion of the risks, benefits and alternatives to treatment. A timeout was performed prior to the initiation of the  procedure. Initial ultrasound scanning demonstrates a large amount of ascites within the left mid to lower abdominal quadrant. The left mid to lower abdomen was prepped and draped in the usual sterile fashion. 1% lidocaine was used for local anesthesia. Following this, a Yueh catheter was introduced. An ultrasound image was saved for documentation purposes. The paracentesis was performed. The catheter was removed and a dressing was applied. The patient tolerated the procedure well without immediate post procedural complication. FINDINGS: A total of approximately 6.4 liters of slightly hazy, yellow fluid was removed. Samples were sent to the laboratory as requested by the clinical team. IMPRESSION: Successful ultrasound-guided diagnostic and therapeutic paracentesis yielding 6.4 liters of peritoneal fluid. The patient will receive IV albumin infusion postprocedure . Read by: Rowe Darryel, PA-C Electronically Signed   By: Sandi Mariscal M.D.   On: 06/17/2016 11:29    Procedures Procedures (including critical care time)  Medications Ordered in ED Medications  morphine 4 MG/ML injection 4 mg (not administered)     Initial Impression / Assessment and Plan / ED Course  I have reviewed the triage vital signs and the nursing notes.  Pertinent labs & imaging results that were available  during my care of the patient were reviewed by me and considered in my medical decision making (see chart for details).     Patient presents with c/o abdominal pain that includes umbilical hernia that is not reducible. CT pending to evaluate the area. He is given morphine for pain and will need reassessment frequently. Anticipate admission.  Final Clinical Impressions(s) / ED Diagnoses   Final diagnoses:  None   1. Abdominal pain 2. Umbilical hernia New Prescriptions New Prescriptions   No medications on file     Charlann Lange, Hershal Coria 06/19/16 [place], MD 06/19/16 [phone]

## 2016-06-19 NOTE — H&P (Signed)
[facility] Admission History and Physical Service Pager: [phone]  [name] Medical record number: [id number] Date of birth: 1940-10-08 Age: 76 y.o. Gender: male  Primary Care Provider: Luiz Blare, DO Consultants: General Surgery Code Status: DNR/DNI confirmed this admission  Chief Complaint: Abdominal Pain  Assessment and Plan: Donald Moreno is a 76 y.o. male presenting with abdominal pain likely secondary to ascites. PMH is significant for alcoholic cirrhosis, end stage liver disease, asthma, gout, inguinal hernia, CKD III, history of atrial flutter.  #Abdominal pain with recurrent ascites from EtOH Cirrhosis - Crampy, lower abdominal pain likely 2/2 recurrent ascites and ?SBO vs ilieus. Most recent paracentesis removed 6.4L 2 days ago. There was also initial concern for incarceration of umbilical hernia, however this was ruled out with CT abdomen, and the hernia was soft and reproducible when seen by admitting team.  GU etiology of pain was considered, however urinalysis was unremarkable. Pain improved with 4 mg morphine in the ED. Low concern for SBP at this time, no fevers, no leukocytosis, abdomen non-tender to palpation without skin changes. Low threshold to start antibiotics if the patient should develop worsening pain. - Admit to [facility], attending Dr. Ardelia Mems - IR consult for paracentesis in AM - PT/INR in AM - SCDs for prophylaxis given hx GI bleed, hx cirrhosis - vitals per unit protocol  #Partial Small Bowel Obstruction vs. Ileus - Noted on CT abdomen. General surgery on board. Patient's last bowel movement was yesterday, endorses passing gas today. Eating normally without diarrhea or vomiting.  - NPO, bowel rest - appreciate surgery recs - if NPO for more than 8-12 hours consider starting IV fluids - fluid restriction for recurrent ascites - hold off on NG tube   #Asymptomatic Sinus Bradycardia - unclear etiology. Appears new  in onset. Patient comfortably ambulates with bradycardia.Cardiology recommended holding amiodarone but no need for formal consult at this time. No CP, no palpitations, no dyspnea. H/o a.flutter with controlled ventriclar rate on amiodarone. Follows with cardiology as an outptatient, Dr. Dani Gobble. Plan was to switch patient to nonselective BB at next appointment.  - monitor on telemetry - hold amiodarone  #Avascular Necrosis - Noted on CT abdomen. Patient asymptomatic. - Consider ortho consult when patient is stable or as outpatient  #Asthma - home regimen includes advair 1 puff BID, montelukast (singulair) 10 mg 1 tablet QHS, Proair 2 puffs q4h PRN wheezing.  Mild wheezing noted on admission, patient denies dyspnea or wheezing at home - Continue home regimen. Dulera on formulary substituted for Advair. Ventolin on formulary substituted for Pro-air.  - monitor respiratory status  #CKD III - GFR 34, Cr  1.87 on admission, baseline may be 1.6-1.8 from chart review.  Will hold off fluids at this time as patient presents with worsening ascites.  - monitor BMP - may require gentle hydration if needs to stay NPO for partial SBO  #Protein calorie malnutrition - Continue ensure once no longer NPO  #Gout, stable - Continue allopurinol  #History of GI bleed - This admission no melena blood, no BRBPR, Hgb stable at 13.1.  - Continue home ferrous sulfate 325 mg BID  Disposition: Admit to FPTS telemetry for bradycardia  History of Present Illness:  Donald Moreno is a 76 y.o. male presenting with crampy lower abdominal pain of one day duration. The patient was in his usual state of health until 1 am yesterday evening when non-radiating crampy pain came on suddenly and prevented him from sleeping. Abdomen was noted to  be tight, patient had his maximum tolerated volume of 6.4L removed by abdominal paracentesis 2 days ago, notably had 12 liters of ascitic fluid present at that time. Last bowel movement was  yesterday, has been passing gas and eating normally, no episodes of vomiting. No fevers, no chest pain, no dizziness. Abdominal pain improved in the ED with morphine 4 mg.    ED Course: Patient was noted to have lower abdominal pain. Due to the presence of a ventral hernia, CT abdomen was performed and showed a partial bowel obstruction, marked ascites, no hernia incarceration for fluid-filled umbilical hernia or inguinal hernia.  Cirrhosis and cholelithiasis also noted. General surgery was called for SBO, bowel rest was recommended. Patient was bradycardic in the emergency department and cardiology was called, however recommendation was to discontinue amiodarone and because patient was asymptomatic and walking around with bradycardia, no formal consult was necessary per cardiology. Patient reports he did not take his medications today. No CP or palpitations, no dyspnea.  Review Of Systems: Per HPI with the following additions:   Review of Systems  Constitutional: Negative for chills, fever and weight loss.  Respiratory: Negative for shortness of breath and wheezing.   Cardiovascular: Negative for chest pain, palpitations and leg swelling.  Gastrointestinal: Negative for abdominal pain, constipation, diarrhea, nausea and vomiting.  Neurological: Negative for dizziness, sensory change, focal weakness, weakness and headaches.    Patient Active Problem List   Diagnosis Date Noted  . Partial small bowel obstruction 06/19/2016  . Advance care planning 08/01/2015  . Alcoholic cirrhosis of liver with ascites ([place])   . Typical atrial flutter ([place])   . Protein-calorie malnutrition, severe 07/02/2015  . CKD (chronic kidney disease), stage III 05/27/2015  . History of GI bleed 03/04/2015  . At risk for decreased bone density 03/04/2015  . Weight loss, unintentional 03/04/2015  . Inguinal hernia 10/28/2014  . Preventative health care 11/11/2011  . Asthma 01/29/2010  . NICOTINE ADDICTION 04/30/2007   . Gout 07/13/2006  . HYPERTENSION, BENIGN SYSTEMIC 07/13/2006  . Arthropathy 07/13/2006    Past Medical History: Past Medical History:  Diagnosis Date  . Arthritis   . Asthma   . ETOH abuse   . Gout   . Hypertension   . Liver cirrhosis ([place]) 2015  . Spontaneous bacterial peritonitis ([place]) 07/01/2015    Past Surgical History: History reviewed. No pertinent surgical history.  Social History: Social History  Substance Use Topics  . Smoking status: Never Smoker  . Smokeless tobacco: Current User    Types: Snuff  . Alcohol use No     Comment: quit drinking about 2 years ago   Please also refer to relevant sections of EMR.  Family History: Family History  Problem Relation Age of Onset  . Other Mother     died when pt was only 62 - unknown cause.  . Other Father     deceased.  . Asthma Sister   . Heart disease Sister     multiple stents.  . Asthma Brother   . Hypertension Brother   . Asthma Son   . Asthma Brother   . Hypertension Brother     Allergies and Medications: No Known Allergies No current facility-administered medications on file prior to encounter.    Current Outpatient Prescriptions on File Prior to Encounter  Medication Sig Dispense Refill  . ADVAIR DISKUS 500-50 MCG/DOSE AEPB USE 1 PUFF TWO TIMES DAILY 180 each 1  . allopurinol (ZYLOPRIM) 300 MG tablet Take 1 tablet by  mouth  daily 90 tablet 1  . Cholecalciferol (VITAMIN D3) 1000 UNITS CAPS Take 1 capsule (1,000 Units total) by mouth daily. 60 capsule 1  . ferrous sulfate 325 (65 FE) MG tablet Take 1 tablet (325 mg total) by mouth 2 (two) times daily with a meal. (Patient taking differently: Take 325 mg by mouth daily with breakfast. ) 60 tablet 3  . montelukast (SINGULAIR) 10 MG tablet Take 1 tablet (10 mg total) by mouth at bedtime. 90 tablet 6  . PROAIR HFA 108 (90 Base) MCG/ACT inhaler Use 2 puffs every 4 hours  as needed for wheezing or  shortness of breath 51 g 3  . Nutritional Supplements  (ENSURE COMPLETE SHAKE) LIQD Take 1 Container by mouth daily. (Patient not taking: Reported on 06/19/2016) 237 mL 11    Objective: BP (!) 154/86 (BP Location: Right Arm)   Pulse (!) 34   Temp 97.3 F (36.3 C) (Oral)   Resp 18   Ht 5\' 10"  (1.778 m)   Wt 145 lb 3.2 oz (65.9 kg)   SpO2 100%   BMI 20.83 kg/m  Exam: General: NAD, patient pleasantly rests comfortably on the stretcher, converses appropriately Eyes: +mild scleral icterus, no conjunctival pallor or injection, EOMI ENTM: no rhinorrhea, mucous membranes moist, no pharyngeal erythema or exudate Neck: full ROM, no thyromegaly Cardiovascular: +bradycardia but regular rhythm, no M/R/G Respiratory: +soft  inspiratory and expiratory wheezes in bilateral upper lung fields, good effort, comfortable work of breathing on room air, no rhonchi or rales Gastrointestinal: +umbilical hernia soft and reducible, abdomen markedly distended but non-tender to palpation, normoactive BS appreciated, no hepatosplenomegaly, no rebound tenderness or guarding MSK: patient moves 4 extremities equally, no edema Derm: no jaundice noted, no rashes or lesions Neuro: CN II-XII grossly intact, no focal deficits Psych: AAOx3, appropriate affect, thought process linear  Labs and Imaging: Results for orders placed or performed during the hospital encounter of 06/19/16 (from the past 24 hour(s))  Lipase, blood     Status: None   Collection Time: 06/19/16  2:47 AM  Result Value Ref Range   Lipase 29 11 - 51 U/L  Comprehensive metabolic panel     Status: Abnormal   Collection Time: 06/19/16  2:47 AM  Result Value Ref Range   Sodium 140 135 - 145 mmol/L   Potassium 4.6 3.5 - 5.1 mmol/L   Chloride 108 101 - 111 mmol/L   CO2 25 22 - 32 mmol/L   Glucose, Bld 95 65 - 99 mg/dL   BUN 21 (H) 6 - 20 mg/dL   Creatinine, Ser 1.87 (H) 0.61 - 1.24 mg/dL   Calcium 9.0 8.9 - 10.3 mg/dL   Total Protein 6.6 6.5 - 8.1 g/dL   Albumin 3.7 3.5 - 5.0 g/dL   AST 24 15 - 41 U/L    ALT 13 (L) 17 - 63 U/L   Alkaline Phosphatase 80 38 - 126 U/L   Total Bilirubin 0.8 0.3 - 1.2 mg/dL   GFR calc non Af Amer 34 (L) >60 mL/min   GFR calc Af Amer 39 (L) >60 mL/min   Anion gap 7 5 - 15  CBC     Status: Abnormal   Collection Time: 06/19/16  2:47 AM  Result Value Ref Range   WBC 6.9 4.0 - 10.5 K/uL   RBC 3.82 (L) 4.22 - 5.81 MIL/uL   Hemoglobin 13.1 13.0 - 17.0 g/dL   HCT 38.5 (L) 39.0 - 52.0 %   MCV 100.8 (H) 78.0 -  100.0 fL   MCH 34.3 (H) 26.0 - 34.0 pg   MCHC 34.0 30.0 - 36.0 g/dL   RDW 14.1 11.5 - 15.5 %   Platelets 125 (L) 150 - 400 K/uL  Urinalysis, Routine w reflex microscopic     Status: Abnormal   Collection Time: 06/19/16  2:49 AM  Result Value Ref Range   Color, Urine YELLOW YELLOW   APPearance HAZY (A) CLEAR   Specific Gravity, Urine 1.020 1.005 - 1.030   pH 5.0 5.0 - 8.0   Glucose, UA NEGATIVE NEGATIVE mg/dL   Hgb urine dipstick NEGATIVE NEGATIVE   Bilirubin Urine NEGATIVE NEGATIVE   Ketones, ur NEGATIVE NEGATIVE mg/dL   Protein, ur NEGATIVE NEGATIVE mg/dL   Nitrite NEGATIVE NEGATIVE   Leukocytes, UA NEGATIVE NEGATIVE    Ct Abdomen Pelvis Wo Contrast  06/19/2016 FINDINGS:  Lower chest: No acute abnormality. Hepatobiliary: Cirrhotic liver. Stones in the nondistended gallbladder.  Pancreas: Unremarkable. No pancreatic ductal dilatation or surrounding inflammatory changes.  Spleen: Normal in size without focal abnormality.  Adrenals/Urinary Tract: Probable small right renal cyst. No suspicious masses, hydronephrosis, or perinephric stranding. No ureterectasis or ureteral stones. The bladder is unremarkable.  Stomach/Bowel: The stomach is mildly distended. There are borderline to mildly dilated loops of small bowel in the abdomen measuring between 2.5 and 3.2 cm. A few air-fluid levels are seen as well. The most distal small bowel is relatively decompressed. Most of the colon is relatively decompressed compared to the small bowel. The cecum is herniated  into a right inguinal hernia. The appendix is normal in appearance.  Vascular/Lymphatic: Atherosclerosis is seen in the non aneurysmal aorta. No adenopathy.  Reproductive: Prostate is unremarkable.  Other: Marked ascites is identified. There is an umbilical hernia. There is small bowel E to the hernia but I see no evidence of herniated bowel.  Musculoskeletal: AVN in the femoral heads.   IMPRESSION:  1. Small bowel loops are borderline to mildly dilated involving the distal jejunum and probably the proximal ileum. The most distal small bowel is decompressed and the colon is relatively decompressed relative to the small bowel. The findings are consistent with an early or partial small bowel obstruction. A discrete transition point is not seen but I suspect the transition point is in the mid ileum. There is an umbilical hernia which contains fluid. However, I see no extension of bowel into the hernia to suggest this is the cause.  2. Marked ascites. Fluid filled umbilical hernia. No extension of bowel into the hernia.  3. The cecum extends into a right inguinal hernia with no resulting obstruction. The appendix is normal.  4. Cirrhosis.  5. Cholelithiasis.  6. Atherosclerosis.  Electronically Signed   By: Dorise Bullion III M.D   On: 06/19/2016 09:51   Everrett Coombe, MD 06/19/2016, 3:13 PM PGY-1, [facility] Intern pager: [phone], text pages welcome  FPTS Upper-Level Resident Addendum  I have independently interviewed and examined the patient. I have discussed the above with the original author and agree with their documentation. My edits for correction/addition/clarification are in pink. Please see also any attending notes.   Katheren Shams, DO PGY-3, [place] Service pager: [phone] (text pages welcome through [facility])

## 2016-06-19 NOTE — ED Provider Notes (Signed)
PROGRESS NOTE                                                                                                                 This is a sign-out from [place] at shift change: Donald Moreno is a 77 y.o. male presenting with abdominal pain. He had therapeutic paracentesis yesterday where 7 L were taken off and he received 50 mg of albumin. Pain has been subtly increasing over the course of the last day he is afebrile with no white count and eating and drinking normally with no change in bowel habits or gas pattern. He has a nonreducible and tender area on the left of the umbilicus. Plan is to follow-up CT scan. Patient was noted to be bradycardic in the upper 30s. He is asymptomatic with no lightheaded sensation, weakness, chest pain, shortness of breath. Discussed with his cardiologist Dr. Loletha Grayer who has evaluated the EKG as this patient ison amiodarone he advises that it is held, states that he is asymptomatic and being discharged to follow with him as an outpatient. Please refer to previous note for full HPI, ROS, PMH and PE.    CT shows small bowel loops dilated colon decompressed findings consistent with early or partial SBO. No transition point is identified. There is an ascites filled on the local hernia no bowel.  Gen. surgery consult from [facility] appreciated: They will evaluate the patient and consult on the floor.  This is a family medicine patient, discussed with resident Dr. Gerarda Fraction who accepts admission with attending Dr. Ardelia Mems. We've discussed that this patient may benefit from a second paracentesis.    Monico Blitz, PA-C 06/19/16 [place], MD 06/26/16 [phone]

## 2016-06-19 NOTE — ED Notes (Signed)
Patient transported to CT 

## 2016-06-19 NOTE — Progress Notes (Signed)
Donald Moreno is a 76 y.o. male patient admitted from ED awake, alert - oriented  X 4 - no acute distress noted.  VSS - Blood pressure (!) 154/86, pulse (!) 34, temperature 97.3 F (36.3 C), temperature source Oral, resp. rate 18, height 5\' 10"  (1.778 m), weight 65.9 kg (145 lb 3.2 oz), SpO2 100 %.    IV in place, occlusive dsg intact without redness.  Orientation to room, and floor completed with information packet given to patient/family.  Patient declined safety video at this time.  Admission INP armband ID verified with patient/family, and in place.   SR up x 2, fall assessment complete, with patient and family able to verbalize understanding of risk associated with falls, and verbalized understanding to call nsg before up out of bed.  Call light within reach, patient able to voice, and demonstrate understanding.  Skin, clean-dry- intact without evidence of bruising, or skin tears.   No evidence of skin break down noted on exam.     Will cont to eval and treat per MD orders.  Dorris Carnes, RN 06/19/2016 1:52 PM

## 2016-06-19 NOTE — ED Notes (Addendum)
Gave report to [place], RN on 5W. Pt being transported by tech

## 2016-06-19 NOTE — ED Notes (Signed)
Pt c/o LLQ and umbilical pain onset today. Pt had 7L of fluid drained from abdomen on Friday. Junctional rhythm noted, pt a&o x4

## 2016-06-19 NOTE — ED Notes (Signed)
Pt ambulated down hallway. Heart rate variable between 40-50. Pt tolerated well.

## 2016-06-19 NOTE — ED Notes (Addendum)
Returned from ct scan and placed back on monitor

## 2016-06-19 NOTE — ED Notes (Signed)
Admitting here to see pt 

## 2016-06-19 NOTE — ED Triage Notes (Addendum)
Pt endorses abdominal pain that began today, pt has ascites and hernia that is out due to ascites due to cirrhosis, pt states "My dr drains my stomach every Friday and I had it drained yesterday" VSS. Pt alert and oriented x4. Denies n/v/d

## 2016-06-19 NOTE — Progress Notes (Signed)
Report received from the ED.  

## 2016-06-19 NOTE — Consult Note (Signed)
[facility] Surgery Consult Note  Donald Moreno 1941-01-08  [id number].    Requesting MD: Betsey Holiday Chief Complaint/Reason for Consult: Abdominal pain  HPI:  Donald Moreno is a 76yo PMH cirrhosis who presented to ED earlier today with acute onset abdominal pain. States that he had a therapeutic paracentesis 06/18/16 where 7L of ascitic fluid was removed. States that last night he started having lower abdominal pain. Denies fever, chills, nausea, vomiting, or dysuria. The pain was constant and severe. Also states that he has had an umbilical hernia for several years, and last night it was very firm and tender. Since admission his pain has resolved. He is passing flatus. Hernia is again soft. Last BM last night was normal. denies diarrhea or constipation.   PMH significant for cirrhosis, HTN, gout, h/o ETOH abuse No prior h/o abdominal surgery Employment: disabled  ROS: Review of Systems  Constitutional: Negative.   Eyes: Negative.   Respiratory: Negative.   Cardiovascular: Negative.   Gastrointestinal: Positive for abdominal pain. Negative for blood in stool, constipation, diarrhea, heartburn, melena, nausea and vomiting.  Genitourinary: Negative.   Skin: Negative.    All systems reviewed and otherwise negative except for as above  Family History  Problem Relation Age of Onset  . Other Mother     died when pt was only 53 - unknown cause.  . Other Father     deceased.  . Asthma Sister   . Heart disease Sister     multiple stents.  . Asthma Brother   . Hypertension Brother   . Asthma Son   . Asthma Brother   . Hypertension Brother     Past Medical History:  Diagnosis Date  . Arthritis   . Asthma   . ETOH abuse   . Gout   . Hypertension   . Liver cirrhosis ([place]) 2015  . Spontaneous bacterial peritonitis ([place]) 07/01/2015    History reviewed. No pertinent surgical history.  Social History:  reports that he has never smoked. His smokeless tobacco use includes  Snuff. He reports that he does not drink alcohol or use drugs.  Allergies: No Known Allergies   (Not in a hospital admission)  Prior to Admission medications   Medication Sig Start Date End Date Taking? Authorizing Provider  ADVAIR DISKUS 500-50 MCG/DOSE AEPB USE 1 PUFF TWO TIMES DAILY 02/23/16  Yes Katheren Shams, DO  allopurinol (ZYLOPRIM) 300 MG tablet Take 1 tablet by mouth  daily 01/14/16  Yes Katheren Shams, DO  Cholecalciferol (VITAMIN D3) 1000 UNITS CAPS Take 1 capsule (1,000 Units total) by mouth daily. 03/05/15  Yes Katheren Shams, DO  ferrous sulfate 325 (65 FE) MG tablet Take 1 tablet (325 mg total) by mouth 2 (two) times daily with a meal. Patient taking differently: Take 325 mg by mouth daily with breakfast.  10/21/14  Yes Ashly M Gottschalk, DO  montelukast (SINGULAIR) 10 MG tablet Take 1 tablet (10 mg total) by mouth at bedtime. 09/23/15  Yes Katheren Shams, DO  PROAIR HFA 108 [phone] Base) MCG/ACT inhaler Use 2 puffs every 4 hours  as needed for wheezing or  shortness of breath 11/26/15  Yes Alyssa Bishop [place], MD  Nutritional Supplements (ENSURE COMPLETE SHAKE) LIQD Take 1 Container by mouth daily. Patient not taking: Reported on 06/19/2016 03/04/15   Katheren Shams, DO    Blood pressure 134/70, pulse (!) 36, temperature 98.5 F (36.9 C), temperature source Oral, resp. rate 16, height 5' 10.5" (1.791 m), weight 144 lb (  65.3 kg), SpO2 96 %. Physical Exam: General: pleasant, chronically ill appearing AA male who is laying in bed in NAD HEENT: head is normocephalic, atraumatic.  Sclera are noninjected.  Mouth is dry Heart: regular rhythm, bradycardic.  No obvious murmurs, gallops, or rubs noted.  Palpable pedal pulses bilaterally Lungs: CTAB, no wheezes, rhonchi, or rales noted.  Respiratory effort nonlabored Abd: soft, distended, nontender, +BS. Large, soft, nontender umbilical hernia, no BS in hernia, easy to compress but does not reduce, no skin changes MS: all 4 extremities are  symmetrical with no cyanosis, clubbing, or edema. Skin: warm and dry with no masses, lesions, or rashes Psych: A&Ox3 with an appropriate affect. Neuro: CM 2-12 intact, extremity CSM intact bilaterally, normal speech  Results for orders placed or performed during the hospital encounter of 06/19/16 (from the past 48 hour(s))  Lipase, blood     Status: None   Collection Time: 06/19/16  2:47 AM  Result Value Ref Range   Lipase 29 11 - 51 U/L  Comprehensive metabolic panel     Status: Abnormal   Collection Time: 06/19/16  2:47 AM  Result Value Ref Range   Sodium 140 135 - 145 mmol/L   Potassium 4.6 3.5 - 5.1 mmol/L   Chloride 108 101 - 111 mmol/L   CO2 25 22 - 32 mmol/L   Glucose, Bld 95 65 - 99 mg/dL   BUN 21 (H) 6 - 20 mg/dL   Creatinine, Ser 1.87 (H) 0.61 - 1.24 mg/dL   Calcium 9.0 8.9 - 10.3 mg/dL   Total Protein 6.6 6.5 - 8.1 g/dL   Albumin 3.7 3.5 - 5.0 g/dL   AST 24 15 - 41 U/L   ALT 13 (L) 17 - 63 U/L   Alkaline Phosphatase 80 38 - 126 U/L   Total Bilirubin 0.8 0.3 - 1.2 mg/dL   GFR calc non Af Amer 34 (L) >60 mL/min   GFR calc Af Amer 39 (L) >60 mL/min    Comment: (NOTE) The eGFR has been calculated using the CKD EPI equation. This calculation has not been validated in all clinical situations. eGFR's persistently <60 mL/min signify possible Chronic Kidney Disease.    Anion gap 7 5 - 15  CBC     Status: Abnormal   Collection Time: 06/19/16  2:47 AM  Result Value Ref Range   WBC 6.9 4.0 - 10.5 K/uL   RBC 3.82 (L) 4.22 - 5.81 MIL/uL   Hemoglobin 13.1 13.0 - 17.0 g/dL   HCT 38.5 (L) 39.0 - 52.0 %   MCV 100.8 (H) 78.0 - 100.0 fL   MCH 34.3 (H) 26.0 - 34.0 pg   MCHC 34.0 30.0 - 36.0 g/dL   RDW 14.1 11.5 - 15.5 %   Platelets 125 (L) 150 - 400 K/uL  Urinalysis, Routine w reflex microscopic     Status: Abnormal   Collection Time: 06/19/16  2:49 AM  Result Value Ref Range   Color, Urine YELLOW YELLOW   APPearance HAZY (A) CLEAR   Specific Gravity, Urine 1.020 1.005 -  1.030   pH 5.0 5.0 - 8.0   Glucose, UA NEGATIVE NEGATIVE mg/dL   Hgb urine dipstick NEGATIVE NEGATIVE   Bilirubin Urine NEGATIVE NEGATIVE   Ketones, ur NEGATIVE NEGATIVE mg/dL   Protein, ur NEGATIVE NEGATIVE mg/dL   Nitrite NEGATIVE NEGATIVE   Leukocytes, UA NEGATIVE NEGATIVE   Ct Abdomen Pelvis Wo Contrast  Result Date: 06/19/2016 CLINICAL DATA:  Incarcerated umbilical hernia. EXAM: CT ABDOMEN AND PELVIS  WITHOUT CONTRAST TECHNIQUE: Multidetector CT imaging of the abdomen and pelvis was performed following the standard protocol without IV contrast. COMPARISON:  January 24, 2015 FINDINGS: Lower chest: No acute abnormality. Hepatobiliary: Cirrhotic liver. Stones in the nondistended gallbladder. Pancreas: Unremarkable. No pancreatic ductal dilatation or surrounding inflammatory changes. Spleen: Normal in size without focal abnormality. Adrenals/Urinary Tract: Probable small right renal cyst. No suspicious masses, hydronephrosis, or perinephric stranding. No ureterectasis or ureteral stones. The bladder is unremarkable. Stomach/Bowel: The stomach is mildly distended. There are borderline to mildly dilated loops of small bowel in the abdomen measuring between 2.5 and 3.2 cm. A few air-fluid levels are seen as well. The most distal small bowel is relatively decompressed. Most of the colon is relatively decompressed compared to the small bowel. The cecum is herniated into a right inguinal hernia. The appendix is normal in appearance. Vascular/Lymphatic: Atherosclerosis is seen in the non aneurysmal aorta. No adenopathy. Reproductive: Prostate is unremarkable. Other: Marked ascites is identified. There is an umbilical hernia. There is small bowel E to the hernia but I see no evidence of herniated bowel. Musculoskeletal: AVN in the femoral heads. IMPRESSION: 1. Small bowel loops are borderline to mildly dilated involving the distal jejunum and probably the proximal ileum. The most distal small bowel is  decompressed and the colon is relatively decompressed relative to the small bowel. The findings are consistent with an early or partial small bowel obstruction. A discrete transition point is not seen but I suspect the transition point is in the mid ileum. There is an umbilical hernia which contains fluid. However, I see no extension of bowel into the hernia to suggest this is the cause. 2. Marked ascites. Fluid filled umbilical hernia. No extension of bowel into the hernia. 3. The cecum extends into a right inguinal hernia with no resulting obstruction. The appendix is normal. 4. Cirrhosis. 5. Cholelithiasis. 6. Atherosclerosis. Electronically Signed   By: Dorise Bullion III M.D   On: 06/19/2016 09:51   US Paracentesis  Result Date: 06/17/2016 INDICATION: Alcoholic cirrhosis, recurrent ascites. Request made for diagnostic and therapeutic paracentesis up to 7 liters. EXAM: ULTRASOUND GUIDED DIAGNOSTIC AND THERAPEUTIC PARACENTESIS MEDICATIONS: None. COMPLICATIONS: None immediate. PROCEDURE: Informed written consent was obtained from the patient after a discussion of the risks, benefits and alternatives to treatment. A timeout was performed prior to the initiation of the procedure. Initial ultrasound scanning demonstrates a large amount of ascites within the left mid to lower abdominal quadrant. The left mid to lower abdomen was prepped and draped in the usual sterile fashion. 1% lidocaine was used for local anesthesia. Following this, a Yueh catheter was introduced. An ultrasound image was saved for documentation purposes. The paracentesis was performed. The catheter was removed and a dressing was applied. The patient tolerated the procedure well without immediate post procedural complication. FINDINGS: A total of approximately 6.4 liters of slightly hazy, yellow fluid was removed. Samples were sent to the laboratory as requested by the clinical team. IMPRESSION: Successful ultrasound-guided diagnostic and  therapeutic paracentesis yielding 6.4 liters of peritoneal fluid. The patient will receive IV albumin infusion postprocedure . Read by: Rowe Eddie, PA-C Electronically Signed   By: Sandi Mariscal M.D.   On: 06/17/2016 11:29      Assessment/Plan Partial small bowel obstruction vs ileus - no prior history of abdominal surgery - s/p therapeutic paracentesis 2/3 for ascites, patient with acute onset lower abdominal pain last night. No nausea or vomiting - CT shows early or partial small bowel obstruction, no  discrete transition point  - he is passing flatus and had BM last night  Umbilical hernia  - CT also shows umbilical hernia which contains fluid but no bowel concern that this could be causing the obstruction - hernia is soft and there are no skin changes  Cirrhosis - s/p paracenesis 7L 06/18/16 HTN Gout ETOH abuse  Plan - Recommend medicine admit for multiple medical problems. Do not recommend surgical intervention for umbilical hernia due to patient's severe liver disease, and it only contains ascitic fluid.  He has a partial small bowel obstruction vs ileus; does not need NG at this time as he has had no n/v, he is passing flatus and had BM last night. Recommend NPO and bowel rest. We will continue to follow.  [name], [facility] Surgery 06/19/2016, 11:01 AM Pager: [phone] Consults: [phone] Mon-Fri 7:00 am-4:30 pm Sat-Sun 7:00 am-11:30 am

## 2016-06-20 DIAGNOSIS — K7031 Alcoholic cirrhosis of liver with ascites: Secondary | ICD-10-CM

## 2016-06-20 LAB — COMPREHENSIVE METABOLIC PANEL
ALT: 12 U/L — ABNORMAL LOW (ref 17–63)
ANION GAP: 4 — AB (ref 5–15)
AST: 20 U/L (ref 15–41)
Albumin: 2.9 g/dL — ABNORMAL LOW (ref 3.5–5.0)
Alkaline Phosphatase: 63 U/L (ref 38–126)
BUN: 22 mg/dL — ABNORMAL HIGH (ref 6–20)
CHLORIDE: 110 mmol/L (ref 101–111)
CO2: 24 mmol/L (ref 22–32)
Calcium: 8.5 mg/dL — ABNORMAL LOW (ref 8.9–10.3)
Creatinine, Ser: 1.56 mg/dL — ABNORMAL HIGH (ref 0.61–1.24)
GFR calc non Af Amer: 42 mL/min — ABNORMAL LOW (ref >60)
GFR, EST AFRICAN AMERICAN: 49 mL/min — AB (ref >60)
Glucose, Bld: 86 mg/dL (ref 65–99)
POTASSIUM: 4.6 mmol/L (ref 3.5–5.1)
SODIUM: 138 mmol/L (ref 135–145)
Total Bilirubin: 1 mg/dL (ref 0.3–1.2)
Total Protein: 5.4 g/dL — ABNORMAL LOW (ref 6.5–8.1)

## 2016-06-20 LAB — CBC
HCT: 34.2 % — ABNORMAL LOW (ref 39.0–52.0)
Hemoglobin: 11.5 g/dL — ABNORMAL LOW (ref 13.0–17.0)
MCH: 33.9 pg (ref 26.0–34.0)
MCHC: 33.6 g/dL (ref 30.0–36.0)
MCV: 100.9 fL — ABNORMAL HIGH (ref 78.0–100.0)
Platelets: 123 10*3/uL — ABNORMAL LOW (ref 150–400)
RBC: 3.39 MIL/uL — ABNORMAL LOW (ref 4.22–5.81)
RDW: 14.3 % (ref 11.5–15.5)
WBC: 5.4 10*3/uL (ref 4.0–10.5)

## 2016-06-20 LAB — PROTIME-INR
INR: 1.25
Prothrombin Time: 15.8 seconds — ABNORMAL HIGH (ref 11.4–15.2)

## 2016-06-20 MED ORDER — ENSURE ENLIVE PO LIQD
237.0000 mL | Freq: Two times a day (BID) | ORAL | Status: DC
  Administered 2016-06-20: 237 mL via ORAL

## 2016-06-20 NOTE — Progress Notes (Signed)
Initial Nutrition Assessment  DOCUMENTATION CODES:   Severe malnutrition in context of chronic illness  INTERVENTION:  Ensure Enlive po BID, each supplement provides 350 kcal and 20 grams of protein  Magic cup TID with meals, each supplement provides 290 kcal and 9 grams of protein  NUTRITION DIAGNOSIS:   Malnutrition related to chronic illness, End-Stage Liver Disease as evidenced by 6 percent weight loss in 1 month, severe depletion of body fat, mild depletion of muscle mass.  GOAL:   Patient will meet greater than or equal to 90% of their needs  MONITOR:   PO intake, Supplement acceptance, Weight trends, Labs  REASON FOR ASSESSMENT:   Malnutrition Screening Tool    ASSESSMENT:   76 y.o. male presenting with abdominal pain likely secondary to ascites. PMH is significant for alcoholic cirrhosis, end stage liver disease, asthma, gout, inguinal hernia, CKD III, history of atrial flutter.   Met with pt in room today. Pt reports good appetite and oral intake pta. Pt reports that he cooks at home and his appetite is always good at home. Pt currently eating 100% meals in hospital. Pt was eating lunch while RD in room. Pt likes Ensure. Pt was drinking Ensure with his meal today. Per chart, pt has lost 9lbs(6%) in a little over a month. Pt with severe ascites; had paracentesis with removal of 7L of fluid on 2/3. Continue to encourage patient to drink supplements.    Medications reviewed and include: allopurinol, Vit D, ferrous sulfate  Labs reviewed: BUN 22(H), creat 1.56(H), Ca 8.5(L) adj. 9.38 wnl, Alb 2.9(L)  Nutrition-Focused physical exam completed. Findings are severe fat depletion, severe muscle depletion, and no edema.   Diet Order:  DIET SOFT Room service appropriate? Yes; Fluid consistency: Thin; Fluid restriction: 2000 mL Fluid  Skin:  Reviewed, no issues  Last BM:  2/3  Height:   Ht Readings from Last 1 Encounters:  06/19/16 '5\' 10"'  (1.778 m)    Weight:   Wt  Readings from Last 1 Encounters:  06/19/16 145 lb 3.2 oz (65.9 kg)    Ideal Body Weight:  74.5 kg  BMI:  Body mass index is 20.83 kg/m.  Estimated Nutritional Needs:   Kcal:  2000-2300kcal/day   Protein:  99-112g/day   Fluid:  >2L/day   EDUCATION NEEDS:   No education needs identified at this time  Koleen Distance, RD, LDN Pager #[phone]

## 2016-06-20 NOTE — Progress Notes (Signed)
S: He feels better this morning, no pain. Denies nausea.   Vitals, labs, intake/output, and orders reviewed at this time.  Gen: A&Ox3, no distress  H&N: EOMI, atraumatic, neck supple Chest: unlabored respirations, RRR Abd: soft, nontender, nondistended, reducible umbilical hernia Ext: warm, no edema Neuro: grossly normal  Lines/tubes/drains: PIV  A/P:  HD 2 with abdominal pain which has resolved  Partial small bowel obstruction vs ileus - no prior history of abdominal surgery - s/p therapeutic paracentesis 2/3 for ascites, patient with acute onset lower abdominal pain last night. No nausea or vomiting - CT read as early or partial small bowel obstruction, no discrete transition point, hernia not involved  - he is passing flatus and had BM last night- diet advanced to soft this morning  Umbilical hernia asymptomatic, reducible  Cirrhosis - s/p paracenesis 7L 06/18/16 HTN Gout ETOH abuse  Plan - No indication for surgical intervention at this time, which is fortunate in this gentleman with cirrhosis and significant ascites. Advanced to soft diet- will see how he tolerates.    Romana Juniper, MD [facility] Surgery, [place] Pager [phone]

## 2016-06-20 NOTE — Progress Notes (Signed)
Family Medicine Teaching Service Daily Progress Note Intern Pager: [phone]  [name] Medical record number: [id number] Date of birth: [birth year] Age: 76 y.o. Gender: male  Primary Care Provider: Luiz Blare, DO Consultants: General surgery Code Status: DNR/DNI confirmed this admission  Pt Overview and Major Events to Date:  06/20/2016 Admit to FPTS  Assessment and Plan: Donald Moreno is a 76 y.o. male presenting with abdominal pain likely secondary to ascites. PMH is significant for alcoholic cirrhosis, end stage liver disease, asthma, gout, inguinal hernia, CKD III, history of atrial flutter.  #Abdominal pain with recurrent ascites from EtOH Cirrhosis - Crampy, lower abdominal pain, suspect was due to now resolved but initially incarcerated hernia given that pain is now improved, hernia previously hard per patient, now soft. Other reasons for abdominal pain include recurrent ascites (6.4L removed 3d ago, pt with weekly paracentesis) though pt states abdomen not very tight compared to his usual state.  Possible SBO vs ileus noted on imaging.  - consider IR consult for paracentesis; leaning towards waiting until previously scheduled paracentesis every friday - SCDs for prophylaxis given hx GI bleed, hx cirrhosis, PT 15.8 - vitals per unit protocol  #Likely Ileus (consider partial SBO) - Noted on CT abdomen. General surgery on board. Patient's last bowel movement was yesterday, endorses passing gas today. Eating normally without diarrhea or vomiting.  - ADAT soft diet, if tolerates plan for DC later today - hold off NG tube  #Asymptomatic Sinus Bradycardia - unclear etiology. Appears new in onset. Patient comfortably ambulates with bradycardia.Cardiology recommended holding amiodarone but no need for formal consult at this time. No CP, no palpitations, no dyspnea. H/o a.flutter with controlled ventriclar rate on amiodarone. Follows with cardiology as an outptatient, Dr.  Dani Gobble. Plan was to switch patient to nonselective BB at next appointment.  - monitor on telemetry - hold amiodarone - plan to hold at discharge with close follow up  #Avascular Necrosis - Noted on CT abdomen. Patient asymptomatic. - Consider ortho consult when patient is stable or as outpatient  #Asthma - home regimen includes advair 1 puff BID, montelukast (singulair) 10 mg 1 tablet QHS, Proair 2 puffs q4h PRN wheezing.  Mild wheezing noted on admission, patient denies dyspnea or wheezing at home - Continue home regimen. Dulera on formulary substituted for Advair. Ventolin on formulary substituted for Pro-air.  - monitor respiratory status  #CKD III - GFR 34, Cr  1.56 from 1.87 on admission, baseline may be 1.6-1.8 from chart review.  Will hold off fluids at this time as patient presents with worsening ascites.  - monitor BMP  #Protein calorie malnutrition - Continue ensure once no longer NPO  #Gout, stable - Continue allopurinol  #History of GI bleed - This admission no melena blood, no BRBPR, Hgb stable at 13.1.  - Continue home ferrous sulfate 325 mg BID  FEN/GI: s/p 8 hrs gentle hydration PPx: SCDs  Disposition: PT consult, likely home pending medical clearance  Subjective:  NAD, rests c omfortably, abdominal pain resolved, abdomen does not feel tight.  Objective: Temp:  [97.3 F (36.3 C)-97.8 F (36.6 C)] 97.8 F (36.6 C) (02/05 0607) Pulse Rate:  [33-76] 47 (02/05 0629) Resp:  [15-18] 17 (02/05 0607) BP: (108-154)/(58-86) 108/78 (02/05 0607) SpO2:  [97 %-100 %] 97 % (02/05 0832) Weight:  [145 lb 3.2 oz (65.9 kg)] 145 lb 3.2 oz (65.9 kg) (02/04 1232) Physical Exam: General: NAD, rests comfortably Cardiovascular: +bradycardic, regular rhythm Respiratory: CTA bil, no W/R/R Abdomen: +umbilical hernia  soft and reducible, no abdominal tenderness, +distension mildly tight Extremities: no LE edema bil  Laboratory:  Recent Labs Lab 06/19/16 0247 06/20/16 0503   WBC 6.9 5.4  HGB 13.1 11.5*  HCT 38.5* 34.2*  PLT 125* 123*    Recent Labs Lab 06/19/16 0247 06/20/16 0503  NA 140 138  K 4.6 4.6  CL 108 110  CO2 25 24  BUN 21* 22*  CREATININE 1.87* 1.56*  CALCIUM 9.0 8.5*  PROT 6.6 5.4*  BILITOT 0.8 1.0  ALKPHOS 80 63  ALT 13* 12*  AST 24 20  GLUCOSE 95 86      Imaging/Diagnostic Tests: No results found.   Everrett Coombe, MD 06/20/2016, 9:47 AM PGY-1, [place] Intern pager: [phone], text pages welcome

## 2016-06-20 NOTE — Progress Notes (Signed)
Donald Moreno discharged Home per MD order.  Discharge instructions reviewed and discussed with the patient, all questions and concerns answered. Copy of instructions given to patient.  Allergies as of 06/20/2016   No Known Allergies     Medication List    STOP taking these medications   amiodarone 200 MG tablet Commonly known as:  PACERONE     TAKE these medications   ADVAIR DISKUS 500-50 MCG/DOSE Aepb Generic drug:  Fluticasone-Salmeterol USE 1 PUFF TWO TIMES DAILY   allopurinol 300 MG tablet Commonly known as:  ZYLOPRIM Take 1 tablet by mouth  daily   ENSURE COMPLETE SHAKE Liqd Take 1 Container by mouth daily.   ferrous sulfate 325 (65 FE) MG tablet Take 1 tablet (325 mg total) by mouth 2 (two) times daily with a meal. What changed:  when to take this   montelukast 10 MG tablet Commonly known as:  SINGULAIR Take 1 tablet (10 mg total) by mouth at bedtime.   PROAIR HFA 108 (90 Base) MCG/ACT inhaler Generic drug:  albuterol Use 2 puffs every 4 hours  as needed for wheezing or  shortness of breath   Vitamin D3 1000 units Caps Take 1 capsule (1,000 Units total) by mouth daily.       Patients skin is clean, dry and intact, no evidence of skin break down. IV site discontinued and catheter remains intact. Site without signs and symptoms of complications. Dressing and pressure applied.  Patient escorted to car by NT in a wheelchair,  no distress noted upon discharge.  Wynetta Emery, Slayter Moorhouse C 06/20/2016 5:41 PM

## 2016-06-20 NOTE — Discharge Instructions (Signed)
It is very important that you stop taking your amiodarone until you are cleared by her cardiologist, Dr. Loletha Grayer. If you experience any lightheadedness, passing out, chest pain or shortness of breath please call 911 and return to the emergency department immediately.  Please go to your follow up appointment at the family medicine center on: Family Medicine Dr. Gerarda Fraction 06/23/2016 at 3:15

## 2016-06-20 NOTE — Discharge Summary (Signed)
[facility] Discharge Summary  [name] Medical record number: [id number] Date of birth: 09-17-1940 Age: 76 y.o. Gender: male Date of Admission: 06/19/2016  Date of Discharge: 06/20/2016 Admitting Physician: Leeanne Rio, MD  Primary Care Provider: Luiz Blare, DO Consultants: Surgery  Indication for Hospitalization: Abdominal pain  Discharge Diagnoses/Problem List:  Patient Active Problem List   Diagnosis Date Noted  . Partial small bowel obstruction 06/19/2016  . Advance care planning 08/01/2015  . Alcoholic cirrhosis of liver with ascites ([place])   . Typical atrial flutter ([place])   . Protein-calorie malnutrition, severe 07/02/2015  . CKD (chronic kidney disease), stage III 05/27/2015  . History of GI bleed 03/04/2015  . At risk for decreased bone density 03/04/2015  . Weight loss, unintentional 03/04/2015  . Inguinal hernia 10/28/2014  . Preventative health care 11/11/2011  . Asthma 01/29/2010  . NICOTINE ADDICTION 04/30/2007  . Gout 07/13/2006  . HYPERTENSION, BENIGN SYSTEMIC 07/13/2006  . Arthropathy 07/13/2006    Disposition: Home  Discharge Condition: Improved/Stable  Discharge Exam:  Temp:  [97.3 F (36.3 C)-97.8 F (36.6 C)] 97.8 F (36.6 C) (02/05 0607) Pulse Rate:  [33-76] 47 (02/05 0629) Resp:  [15-18] 17 (02/05 0607) BP: (108-154)/(58-86) 108/78 (02/05 0607) SpO2:  [97 %-100 %] 97 % (02/05 0832) Weight:  [145 lb 3.2 oz (65.9 kg)] 145 lb 3.2 oz (65.9 kg) (02/04 1232) Physical Exam: General: NAD, rests comfortably Cardiovascular: +bradycardic, regular rhythm Respiratory: CTA bil, no W/R/R Abdomen: +umbilical hernia soft and reducible, no abdominal tenderness, +distension mildly tight Extremities: no LE edema bil  Brief Hospital Course:  Patient was admitted with crampy, lower abdominal pain.  On admission he was noted to have a hard umbilical hernia that was difficult to reduce. The hernia must have  self-reduced because by the time he was imaged there was no incarcerated hernia on CT abdomen, and by the time he was seen by the admitting team his abdomen was soft and nontender. Other reasons that were considered for abdominal pain include recurrent ascites - patient undergoes weekly paracentesis and had 6.4L removed 3 days prior to admission,though pt states abdomen not very tight compared to his usual state.  Possible SBO vs ileus was noted on imaging.   Patient was noted to be bradycardic in ED to high 30s, ambulated with heart rate going to 50s. No symptoms of bradycardia. Amiodarone was held.  He remained bradycardic throughout admission but asymptomatic.  The patient was monitored overnight, diet was slowly advanced. Surgery was consulted but no surgical intervention pursued. No NG tube placed as patient was having normal bowel movements the day of admission and passing gas, suspect it was a mild ileus.  He tolerated advancing diet. He felt his abdomen was not very tight (though was distended).  Decision was made to put off paracentesis until his regularly scheduled appointment at the end of the week.  He was considered stable for discharge.  Issues for Follow Up:  1. Bradycardia to ~39 - asymptomatic even with ambulation. Patient with history of aflutter. Held amiodarone at discharge.  Significant Procedures: None  Significant Labs and Imaging:   Recent Labs Lab 06/19/16 0247 06/20/16 0503  WBC 6.9 5.4  HGB 13.1 11.5*  HCT 38.5* 34.2*  PLT 125* 123*    Recent Labs Lab 06/19/16 0247 06/20/16 0503  NA 140 138  K 4.6 4.6  CL 108 110  CO2 25 24  GLUCOSE 95 86  BUN 21* 22*  CREATININE 1.87*  1.56*  CALCIUM 9.0 8.5*  ALKPHOS 80 63  AST 24 20  ALT 13* 12*  ALBUMIN 3.7 2.9*      Results/Tests Pending at Time of Discharge: None  Discharge Medications:  Allergies as of 06/20/2016   No Known Allergies     Medication List    STOP taking these medications   amiodarone  200 MG tablet Commonly known as:  PACERONE     TAKE these medications   ADVAIR DISKUS 500-50 MCG/DOSE Aepb Generic drug:  Fluticasone-Salmeterol USE 1 PUFF TWO TIMES DAILY   ENSURE COMPLETE SHAKE Liqd Take 1 Container by mouth daily.   ferrous sulfate 325 (65 FE) MG tablet Take 1 tablet (325 mg total) by mouth 2 (two) times daily with a meal. What changed:  when to take this   montelukast 10 MG tablet Commonly known as:  SINGULAIR Take 1 tablet (10 mg total) by mouth at bedtime.   PROAIR HFA 108 (90 Base) MCG/ACT inhaler Generic drug:  albuterol Use 2 puffs every 4 hours  as needed for wheezing or  shortness of breath   Vitamin D3 1000 units Caps Take 1 capsule (1,000 Units total) by mouth daily.       Discharge Instructions: Please refer to Patient Instructions section of EMR for full details.  Patient was counseled important signs and symptoms that should prompt return to medical care, changes in medications, dietary instructions, activity restrictions, and follow up appointments.   Follow-Up Appointments: Follow-up Information    Sanda Klein, MD Follow up.   Specialty:  Cardiology Why:  should call by noon-tomorrow to set up and appointmeny, if you don't recieve a call by noon please call them to set up an appointment Contact information: [place] [address] [phone]        Luiz Blare, DO Follow up on 06/23/2016.   Specialty:  Family Medicine Why:  3:15 pm. Please arrive 15 minutes prior to your appointment. Contact information: [address] [address] [phone]           Everrett Coombe, MD 06/21/2016, 3:17 PM PGY-1, [place]

## 2016-06-23 ENCOUNTER — Ambulatory Visit (INDEPENDENT_AMBULATORY_CARE_PROVIDER_SITE_OTHER): Payer: Medicare Other | Admitting: Obstetrics and Gynecology

## 2016-06-23 ENCOUNTER — Encounter: Payer: Self-pay | Admitting: Obstetrics and Gynecology

## 2016-06-23 VITALS — BP 130/72 | HR 66 | Temp 98.3°F | Ht 70.0 in | Wt 149.0 lb

## 2016-06-23 DIAGNOSIS — R001 Bradycardia, unspecified: Secondary | ICD-10-CM | POA: Diagnosis not present

## 2016-06-23 DIAGNOSIS — K7031 Alcoholic cirrhosis of liver with ascites: Secondary | ICD-10-CM

## 2016-06-23 DIAGNOSIS — Z09 Encounter for follow-up examination after completed treatment for conditions other than malignant neoplasm: Secondary | ICD-10-CM | POA: Diagnosis not present

## 2016-06-23 DIAGNOSIS — Z7189 Other specified counseling: Secondary | ICD-10-CM | POA: Diagnosis not present

## 2016-06-23 NOTE — Assessment & Plan Note (Signed)
Bradycardia noticed in hospital. Has regular rate today. Patient was asymptomatic. Just continue to monitor. Was stopped on his amiodarone.

## 2016-06-23 NOTE — Progress Notes (Signed)
Hospital Follow-up Visit   Date of Admission: 06-19-2016  Date of Discharge: 06-20-2016  Discharged from: [facility]  Discharge Diagnosis: Abdominal Pain and Bradycardia  Summary of Admission:  Patient was admitted with crampy, lower abdominal pain. On admission he was noted to have a hard umbilical hernia that was difficult to reduce. The hernia must have self-reduced because by the time he was imaged there was no incarcerated hernia on CT abdomen. Possible SBO vs ileus was noted on imaging. The patient was monitored overnight, diet was slowly advanced. Surgery was consulted but no surgical intervention pursued.  Patient was noted to be bradycardic in ED to high 30s, ambulated with heart rate going to 50s. No symptoms of bradycardia. Amiodarone was held.  He remained bradycardic throughout admission but asymptomatic.  TODAY's VISIT Patient feels well today. Has no complaints. Denies any mor abdominal pain. Denies anymore firmness to abdomen. Has paracentesis tomorrow which is his regularly scheduled day.   LABS  Lab Reviewed (Yes/No/NA): Yes  PHYSICAL EXAM:  BP 130/72 (BP Location: Right Arm, Patient Position: Sitting, Cuff Size: Normal)   Pulse 66   Temp 98.3 F (36.8 C) (Oral)   Ht 5\' 10"  (1.778 m)   Wt 149 lb (67.6 kg)   SpO2 99%   BMI 21.38 kg/m  Vitals and nursing note reviewed   General: NAD, well-appearing, thin male Cardiovascular: RRR Respiratory: CTA bil, no W/R/R Abdomen: +umbilical hernia soft and reducible, no abdominal tenderness, +distension Extremities: no LE edema bil  ASSESSMENT: Please see separate assessment and plan  PATIENT EDUCATION PROVIDED: See AVS   Luiz Blare, DO 06/23/2016, 4:18 PM PGY-3, [place]

## 2016-06-23 NOTE — Assessment & Plan Note (Signed)
Patient with end-stage liver disease. Continues to have worsening medical decline. Discussed getting palliative involved in patient's care and he however declined. He does have an advanced directive. We discussed the DO NOT RESUSCITATE bracelet he wears. Also does not want to go to any facilities or be put on dialysis.

## 2016-06-23 NOTE — Patient Instructions (Signed)
Call sports medicine when have money for shoe support  Doing well since hospitalization  Consider palliative care discussions   Seek help if having any pain   Go to paracentesis on Friday

## 2016-06-23 NOTE — Assessment & Plan Note (Signed)
Doing well since hospital discharge follow-up. No further abdominal pain. Will continue to monitor. Warning signs given to patient.

## 2016-06-23 NOTE — Assessment & Plan Note (Signed)
Chronic ascites. Receiving paracentesis weekly. Follow-up at regularly schedule appointment.

## 2016-06-24 ENCOUNTER — Ambulatory Visit (HOSPITAL_COMMUNITY)
Admission: RE | Admit: 2016-06-24 | Discharge: 2016-06-24 | Disposition: A | Discharge status: Home / Self Care | Payer: Medicare Other | Source: Ambulatory Visit | Attending: Gastroenterology | Admitting: Gastroenterology

## 2016-06-24 DIAGNOSIS — K7031 Alcoholic cirrhosis of liver with ascites: Secondary | ICD-10-CM | POA: Diagnosis not present

## 2016-06-24 DIAGNOSIS — K7460 Unspecified cirrhosis of liver: Secondary | ICD-10-CM | POA: Diagnosis present

## 2016-06-24 DIAGNOSIS — R188 Other ascites: Secondary | ICD-10-CM

## 2016-06-24 MED ORDER — LIDOCAINE HCL 1 % IJ SOLN
INTRAMUSCULAR | Status: AC
  Filled 2016-06-24: qty 20

## 2016-06-24 MED ORDER — ALBUMIN HUMAN 25 % IV SOLN
50.0000 g | Freq: Once | INTRAVENOUS | Status: AC
  Administered 2016-06-24: 50 g via INTRAVENOUS

## 2016-06-24 NOTE — Procedures (Signed)
Ultrasound-guided diagnostic and therapeutic paracentesis performed yielding 5.3 liters of yellow colored fluid. No immediate complications.  Donald Moreno E 11:10 AM 06/24/2016

## 2016-06-27 ENCOUNTER — Ambulatory Visit: Payer: Medicare Other | Admitting: Cardiovascular Disease

## 2016-06-27 DIAGNOSIS — N2581 Secondary hyperparathyroidism of renal origin: Secondary | ICD-10-CM | POA: Diagnosis not present

## 2016-06-27 DIAGNOSIS — N183 Chronic kidney disease, stage 3 (moderate): Secondary | ICD-10-CM | POA: Diagnosis not present

## 2016-06-27 DIAGNOSIS — D631 Anemia in chronic kidney disease: Secondary | ICD-10-CM | POA: Diagnosis not present

## 2016-07-01 ENCOUNTER — Ambulatory Visit (HOSPITAL_COMMUNITY)
Admission: RE | Admit: 2016-07-01 | Discharge: 2016-07-01 | Disposition: A | Discharge status: Home / Self Care | Payer: Medicare Other | Source: Ambulatory Visit | Attending: Gastroenterology | Admitting: Gastroenterology

## 2016-07-01 DIAGNOSIS — K7460 Unspecified cirrhosis of liver: Secondary | ICD-10-CM | POA: Diagnosis present

## 2016-07-01 DIAGNOSIS — K7031 Alcoholic cirrhosis of liver with ascites: Secondary | ICD-10-CM | POA: Insufficient documentation

## 2016-07-01 DIAGNOSIS — R188 Other ascites: Secondary | ICD-10-CM

## 2016-07-01 MED ORDER — LIDOCAINE HCL (PF) 1 % IJ SOLN
INTRAMUSCULAR | Status: AC
  Filled 2016-07-01: qty 10

## 2016-07-01 MED ORDER — ALBUMIN HUMAN 25 % IV SOLN
INTRAVENOUS | Status: AC
  Filled 2016-07-01: qty 200

## 2016-07-01 MED ORDER — ALBUMIN HUMAN 25 % IV SOLN
50.0000 g | Freq: Once | INTRAVENOUS | Status: AC
  Administered 2016-07-01: 50 g via INTRAVENOUS
  Filled 2016-07-01: qty 200

## 2016-07-01 NOTE — Procedures (Signed)
Ultrasound-guided diagnostic and therapeutic paracentesis performed yielding 7 liters of yellow colored fluid. No immediate complications.  Nicole Defino E 11:24 AM 07/01/2016

## 2016-07-04 DIAGNOSIS — N183 Chronic kidney disease, stage 3 (moderate): Secondary | ICD-10-CM | POA: Diagnosis not present

## 2016-07-04 DIAGNOSIS — D631 Anemia in chronic kidney disease: Secondary | ICD-10-CM | POA: Diagnosis not present

## 2016-07-04 DIAGNOSIS — I129 Hypertensive chronic kidney disease with stage 1 through stage 4 chronic kidney disease, or unspecified chronic kidney disease: Secondary | ICD-10-CM | POA: Diagnosis not present

## 2016-07-04 DIAGNOSIS — N2581 Secondary hyperparathyroidism of renal origin: Secondary | ICD-10-CM | POA: Diagnosis not present

## 2016-07-08 ENCOUNTER — Ambulatory Visit (HOSPITAL_COMMUNITY)
Admission: RE | Admit: 2016-07-08 | Discharge: 2016-07-08 | Disposition: A | Discharge status: Home / Self Care | Payer: Medicare Other | Source: Ambulatory Visit | Attending: Gastroenterology | Admitting: Gastroenterology

## 2016-07-08 DIAGNOSIS — K7031 Alcoholic cirrhosis of liver with ascites: Secondary | ICD-10-CM | POA: Insufficient documentation

## 2016-07-08 DIAGNOSIS — R188 Other ascites: Secondary | ICD-10-CM | POA: Diagnosis not present

## 2016-07-08 DIAGNOSIS — K7460 Unspecified cirrhosis of liver: Secondary | ICD-10-CM | POA: Diagnosis present

## 2016-07-08 MED ORDER — ALBUMIN HUMAN 25 % IV SOLN
INTRAVENOUS | Status: AC
  Administered 2016-07-08: 50 g via INTRAVENOUS
  Filled 2016-07-08: qty 200

## 2016-07-08 MED ORDER — ALBUMIN HUMAN 25 % IV SOLN
50.0000 g | Freq: Once | INTRAVENOUS | Status: AC
  Administered 2016-07-08: 50 g via INTRAVENOUS
  Filled 2016-07-08: qty 200

## 2016-07-08 MED ORDER — LIDOCAINE HCL (PF) 1 % IJ SOLN
INTRAMUSCULAR | Status: AC
  Filled 2016-07-08: qty 10

## 2016-07-08 NOTE — Procedures (Signed)
Ultrasound-guided diagnostic and therapeutic paracentesis performed yielding 6.6 liters of yellow colored fluid. No immediate complications.  Desi Carby E 12:08 PM 07/08/2016

## 2016-07-12 ENCOUNTER — Ambulatory Visit: Payer: Medicare Other | Admitting: Cardiovascular Disease

## 2016-07-12 ENCOUNTER — Ambulatory Visit (INDEPENDENT_AMBULATORY_CARE_PROVIDER_SITE_OTHER): Payer: Medicare Other | Admitting: Cardiovascular Disease

## 2016-07-12 ENCOUNTER — Encounter: Payer: Self-pay | Admitting: Cardiovascular Disease

## 2016-07-12 VITALS — BP 122/60 | HR 50 | Wt 146.0 lb

## 2016-07-12 DIAGNOSIS — N183 Chronic kidney disease, stage 3 unspecified: Secondary | ICD-10-CM

## 2016-07-12 DIAGNOSIS — E43 Unspecified severe protein-calorie malnutrition: Secondary | ICD-10-CM | POA: Diagnosis not present

## 2016-07-12 DIAGNOSIS — R001 Bradycardia, unspecified: Secondary | ICD-10-CM

## 2016-07-12 DIAGNOSIS — K7031 Alcoholic cirrhosis of liver with ascites: Secondary | ICD-10-CM | POA: Diagnosis not present

## 2016-07-12 DIAGNOSIS — I483 Typical atrial flutter: Secondary | ICD-10-CM

## 2016-07-12 NOTE — Progress Notes (Signed)
Patient ID: Donald Moreno, male   DOB: 05-07-1941, 77 y.o.   MRN: [id number]    Cardiology Office Note    Date:  07/12/2016   ID:  Donald Moreno, DOB [birth year], MRN [id number]  PCP:  Luiz Blare, DO  Cardiologist:   Sanda Klein, MD   No chief complaint on file.   History of Present Illness:  Donald Moreno is a 76 y.o. male with advanced liver cirrhosis with portal HTN and ascites, parenchymal insufficiency with spontaneously elevated prothrombin time, atrial flutter with RVR, requiring amiodarone for rate control due to hypotension.   He was hospitalized in early February with umbilical hernia and small bowel obstruction that resolved fairly quickly. He was severely bradycardic, had junctional escape rhythm and amiodarone was discontinued. He did not have any symptoms of bradycardia. At his subsequent visits for paracentesis on February 9 in February 16 his heart rate was in the 40s, but on February 23 it was up to 78  He requires high volume paracentesis periodically, the frequency has recently increased to weekly removal of roughly 6 L. He has not had bleeding, syncope, change in exertional dyspnea (class 2) and is unaware of palpitations. He has not had overt encephalopathy or any focal neurological events. He maintains his usual positive and laid-back attitude, despite his advanced liver disease.   Past Medical History:  Diagnosis Date  . Arthritis   . Asthma   . ETOH abuse   . Gout   . Hypertension   . Liver cirrhosis ([place]) 2015  . Spontaneous bacterial peritonitis ([place]) 07/01/2015    No past surgical history on file.  Outpatient Medications Prior to Visit  Medication Sig Dispense Refill  . ADVAIR DISKUS 500-50 MCG/DOSE AEPB USE 1 PUFF TWO TIMES DAILY 180 each 1  . allopurinol (ZYLOPRIM) 300 MG tablet TAKE 1 TABLET BY MOUTH  DAILY 90 tablet 0  . Cholecalciferol (VITAMIN D3) 1000 UNITS CAPS Take 1 capsule (1,000 Units total) by mouth daily. 60 capsule 1  . ferrous sulfate  325 (65 FE) MG tablet Take 1 tablet (325 mg total) by mouth 2 (two) times daily with a meal. (Patient taking differently: Take 325 mg by mouth daily with breakfast. ) 60 tablet 3  . montelukast (SINGULAIR) 10 MG tablet Take 1 tablet (10 mg total) by mouth at bedtime. 90 tablet 6  . Nutritional Supplements (ENSURE COMPLETE SHAKE) LIQD Take 1 Container by mouth daily. 237 mL 11  . PROAIR HFA 108 (90 Base) MCG/ACT inhaler Use 2 puffs every 4 hours  as needed for wheezing or  shortness of breath 51 g 3   No facility-administered medications prior to visit.      Allergies:   Patient has no known allergies.   Social History   Social History  . Marital status: Divorced    Spouse name: N/A  . Number of children: 5  . Years of education: 10   Occupational History  . Retired- Administrator    Social History Main Topics  . Smoking status: Never Smoker  . Smokeless tobacco: Current User    Types: Snuff  . Alcohol use No     Comment: quit drinking about 2 years ago  . Drug use: No  . Sexual activity: Not Currently   Other Topics Concern  . None   Social History Narrative   Lives with nephew and niece in [place] home.     Tobacco: snuff all day (can last two days. Never smoked.  Hobbies: cooking, sleeping, going to store   Pets:  Dog, Bean     Family History:  The patient's family history includes Asthma in his brother, brother, sister, and son; Heart disease in his sister; Hypertension in his brother and brother; Other in his father and mother.   ROS:   Please see the history of present illness.    ROS All other systems reviewed and are negative.   PHYSICAL EXAM:   VS:  BP 122/60   Pulse (!) 50   Wt 66.2 kg (146 lb)   BMI 20.95 kg/m    GEN: cachectic, well developed, in no acute distress  HEENT: normal  Neck: no JVD, carotid bruits, or masses Cardiac: irregular, no murmurs, rubs, or gallops,no edema  Respiratory:  clear to auscultation bilaterally, normal work of  breathing GI: tense, nontender ascites, + BS MS: no deformity or atrophy  Skin: warm and dry, no rash Neuro:  Alert and Oriented x 3, Strength and sensation are intact Psych: euthymic mood, full affect  Wt Readings from Last 3 Encounters:  07/12/16 66.2 kg (146 lb)  06/24/16 65.3 kg (144 lb)  06/23/16 67.6 kg (149 lb)      Studies/Labs Reviewed:   EKG:  EKG is ordered today.  The computer interpretation is of atrial fibrillation, but I think there is actual atrial standstill. There may be some retrograde P waves. The background rhythm is junctional at about 35-40 bpm, with frequent PVCs making the overall heart rate 50 bpm Recent Labs: 06/20/2016: ALT 12; BUN 22; Creatinine, Ser 1.56; Hemoglobin 11.5; Platelets 123; Potassium 4.6; Sodium 138   Lipid Panel    Component Value Date/Time   CHOL 141 11/11/2011 1030   TRIG 128 11/11/2011 1030   HDL 71 11/11/2011 1030   CHOLHDL 2.0 11/11/2011 1030   VLDL 26 11/11/2011 1030   LDLCALC 44 11/11/2011 1030     ASSESSMENT:    1. Junctional bradycardia   2. Typical atrial flutter ([place])   3. Alcoholic cirrhosis of liver with ascites ([place])   4. Protein-calorie malnutrition, severe   5. CKD (chronic kidney disease), stage III      PLAN:  In order of problems listed above:  1. Bradycardia: Related to amiodarone, which was stopped about a month ago. Suspect this will gradually resolve over a period of 2-3 months, but possibly longer due to underlying liver disease.  2. Atrial flutter: controlled ventricular rate, not a good candidate for anticoagulation or ablation. Recently his blood pressure has been higher than when he initially presented. However, the presence of refractory ascites is a relative contraindication to the use of beta blockers. Have asked him to call me if his heart rate reaches the 90-100 bpm range. Since he has weekly visits for paracentesis, we should be able to quickly detect recurrence of tachyarrhythmia. 3. Cirrhosis:  seems to have severe disease due to ascites/portal hypertension, but not markers of rapid progression. 4. Malnutrition: His BMI overestimates true nutritional status 5. CKD:  Baseline creatinine appears to be 1.5-1.7.    Medication Adjustments/Labs and Tests Ordered: Current medicines are reviewed at length with the patient today.  Concerns regarding medicines are outlined above.  Medication changes, Labs and Tests ordered today are listed in the Patient Instructions below. Patient Instructions  Dr Sallyanne Kuster recommends that you schedule a follow-up appointment in 3 months.  If you need a refill on your cardiac medications before your next appointment, please call your pharmacy.      Signed, Dani Gobble  Courtenay Creger, MD  07/12/2016 1:10 PM    [address], [place], [place]  [address] Phone: [phone]; Fax: [phone]

## 2016-07-12 NOTE — Patient Instructions (Signed)
Dr Croitoru recommends that you schedule a follow-up appointment in 3 months.  If you need a refill on your cardiac medications before your next appointment, please call your pharmacy. 

## 2016-07-13 IMAGING — US US RENAL
1 series · 14 of 25 positions shown · non-contrast
Comparison: 05/04/2015

CLINICAL DATA: Chronic renal disease, stage III.

EXAM:
RENAL / URINARY TRACT ULTRASOUND COMPLETE

[Series 1: us renal · 0.32mm/px · 14 of 39 slices shown]
[im 1/39]
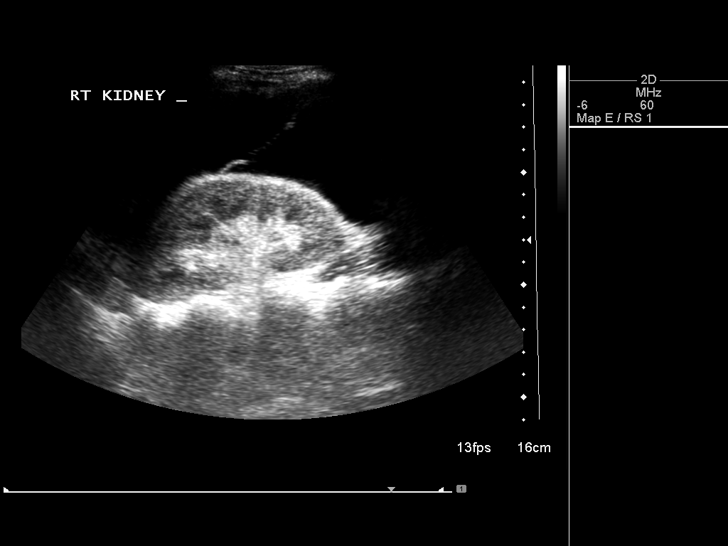
[im 4/39]
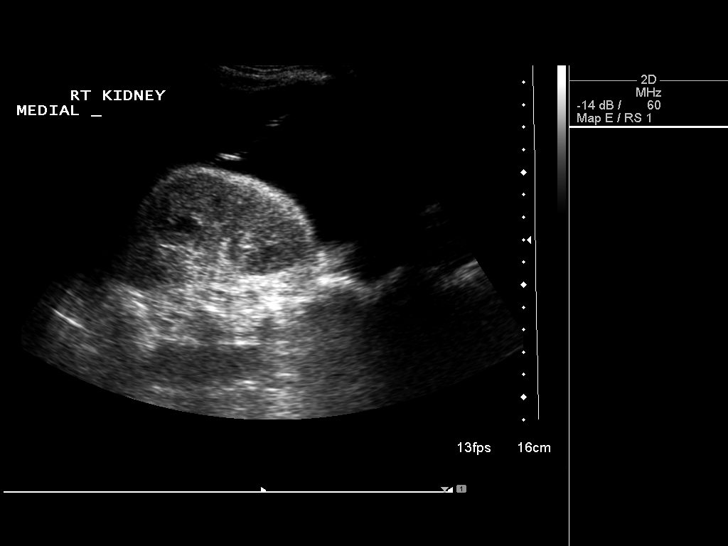
[im 7/39]
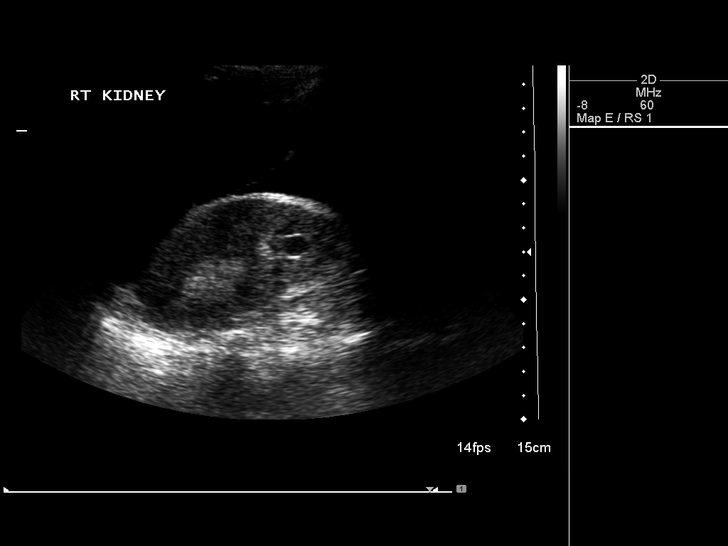
[im 10/39]
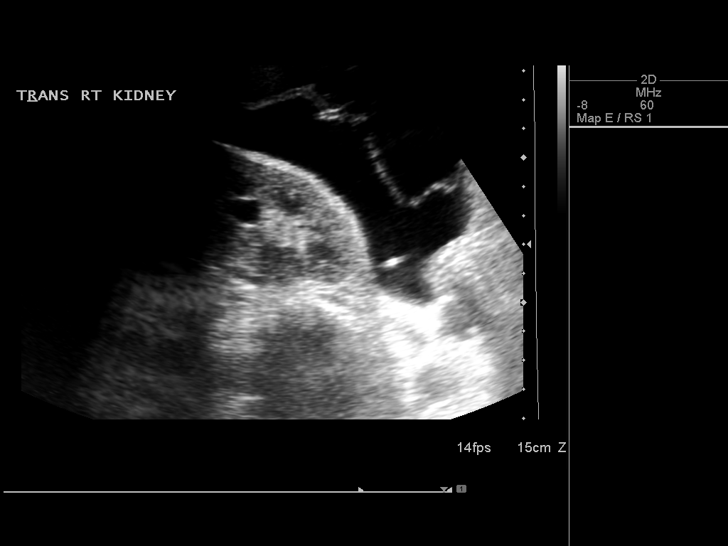
[im 13/39]
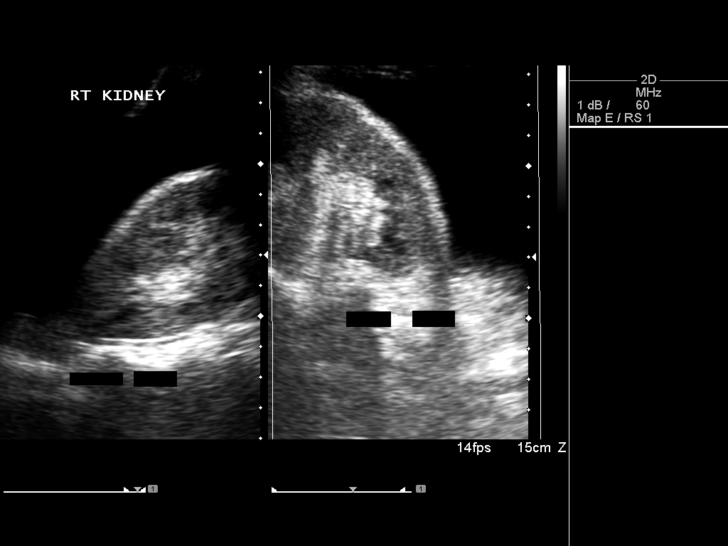
[im 15/39]
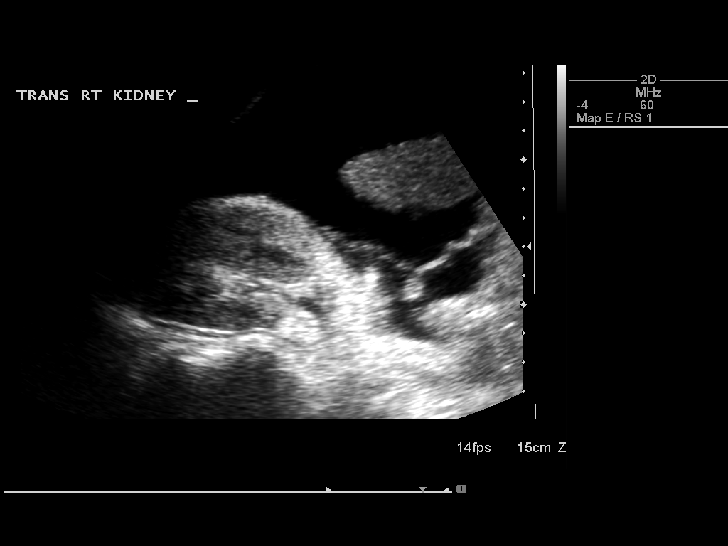
[im 18/39]
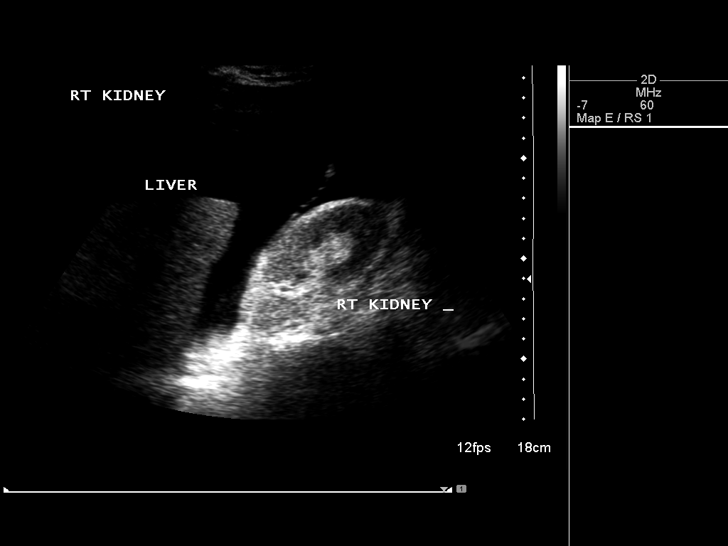
[im 21/39]
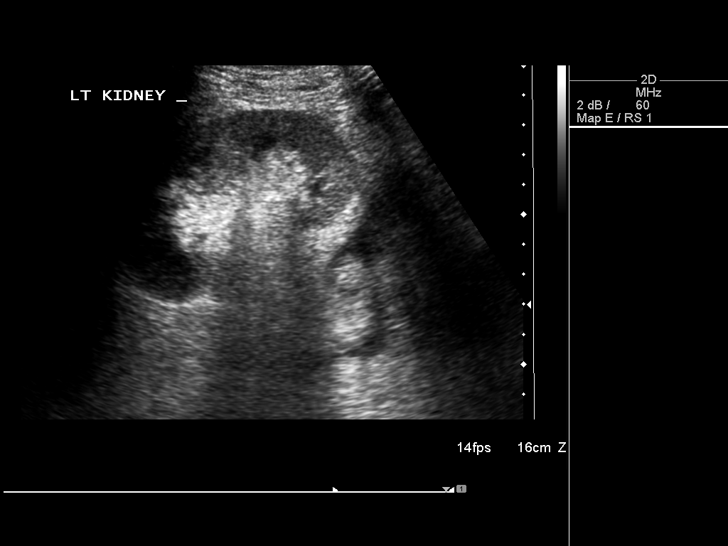
[im 24/39]
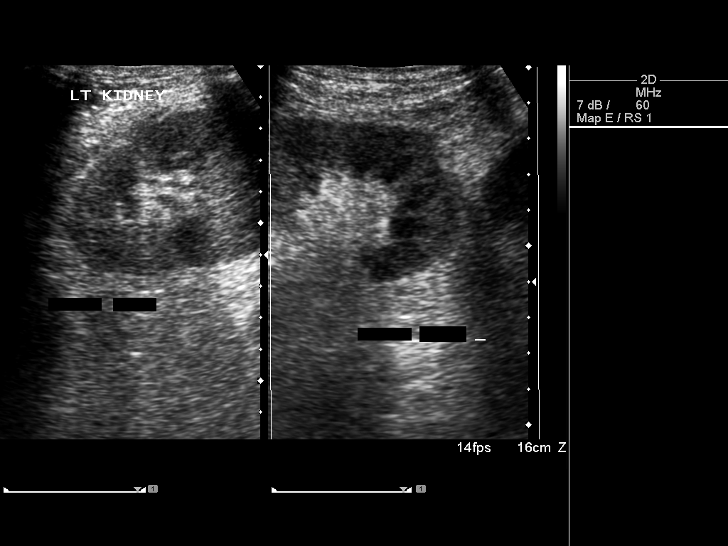
[im 26/39]
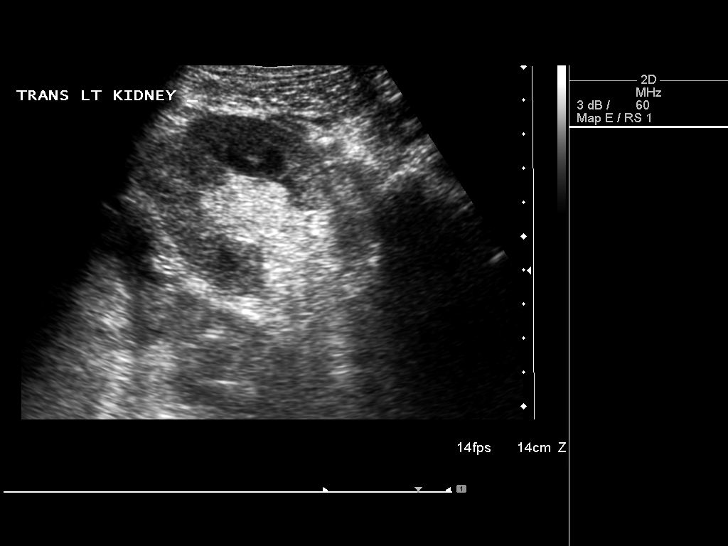
[im 29/39]
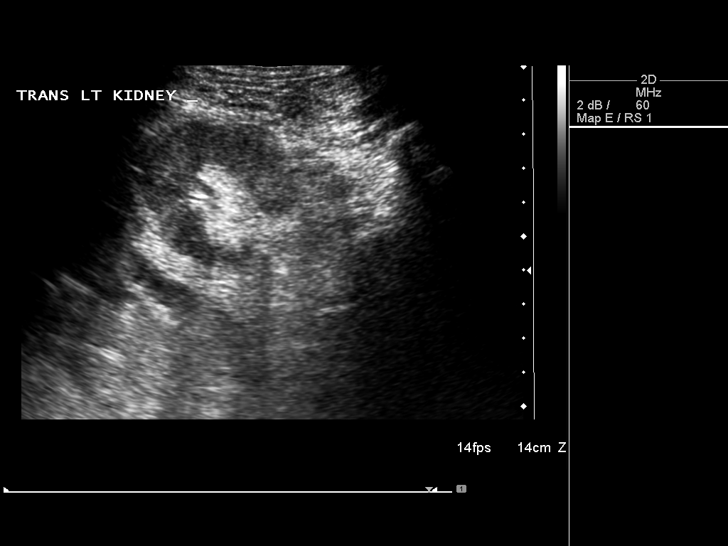
[im 32/39]
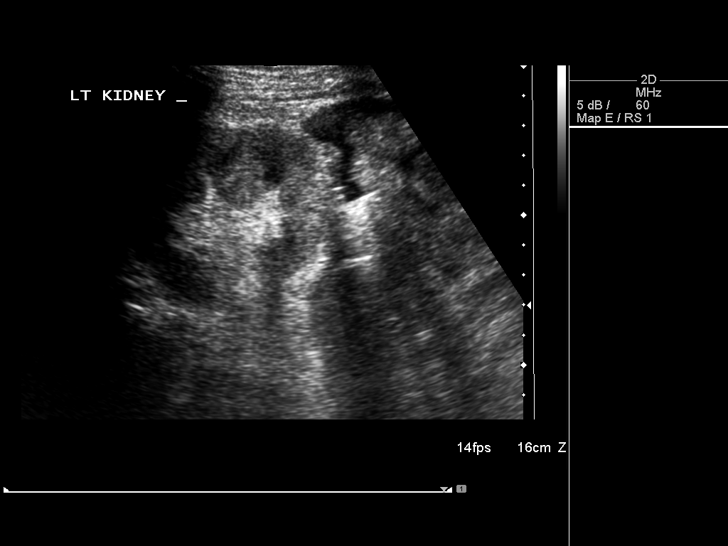
[im 35/39]
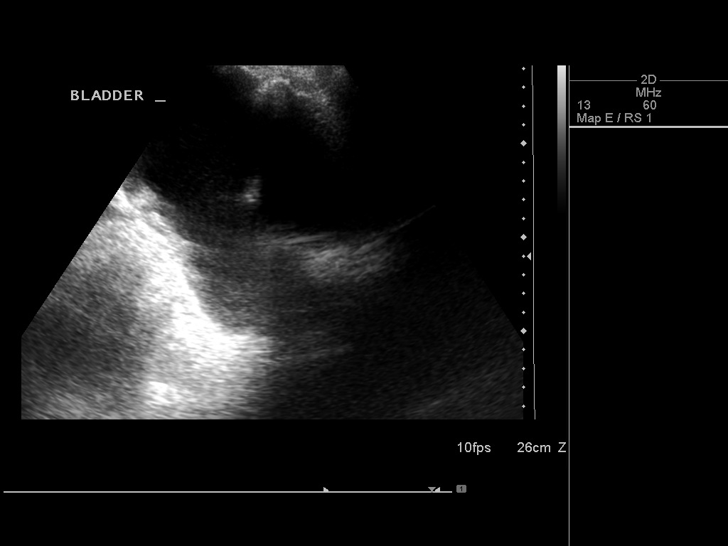
[im 39/39]
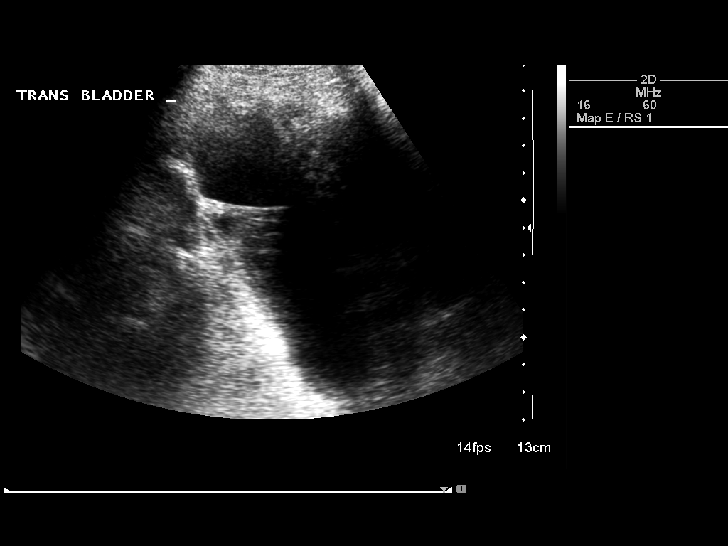

[14 of 25 positions shown; findings below may reference images not displayed]

FINDINGS: Right Kidney:

Length: 10.1 cm. Echogenicity within normal limits. No mass or
hydronephrosis visualized. There is a 1.1 cm lower pole simple
appearing cyst.

Left Kidney:

Length: 9.7 cm. Echogenicity within normal limits. No mass or
hydronephrosis visualized.

Bladder:

Appears normal for degree of bladder distention. Limited
visualization due to the degree of surrounding ascites.

Other: Extensive ascites. The portions of the visualized liver
demonstrates cirrhosis.
IMPRESSION: 1. No hydronephrosis or renal calculi. Small right renal cyst. Mild
chronic renal atrophy.
2. Extensive ascites.
3. Cirrhosis.

## 2016-07-15 ENCOUNTER — Ambulatory Visit (HOSPITAL_COMMUNITY)
Admission: RE | Admit: 2016-07-15 | Discharge: 2016-07-15 | Disposition: A | Discharge status: Home / Self Care | Payer: Medicare Other | Source: Ambulatory Visit | Attending: Gastroenterology | Admitting: Gastroenterology

## 2016-07-15 DIAGNOSIS — K7460 Unspecified cirrhosis of liver: Secondary | ICD-10-CM | POA: Diagnosis present

## 2016-07-15 DIAGNOSIS — R188 Other ascites: Secondary | ICD-10-CM | POA: Diagnosis not present

## 2016-07-15 DIAGNOSIS — K7031 Alcoholic cirrhosis of liver with ascites: Secondary | ICD-10-CM | POA: Diagnosis not present

## 2016-07-15 MED ORDER — LIDOCAINE HCL 1 % IJ SOLN
INTRAMUSCULAR | Status: AC
  Filled 2016-07-15: qty 20

## 2016-07-15 MED ORDER — ALBUMIN HUMAN 25 % IV SOLN
50.0000 g | Freq: Once | INTRAVENOUS | Status: DC
  Filled 2016-07-15: qty 200

## 2016-07-15 MED ORDER — ALBUMIN HUMAN 25 % IV SOLN
INTRAVENOUS | Status: AC
  Administered 2016-07-15: 50 g
  Filled 2016-07-15: qty 200

## 2016-07-15 NOTE — Procedures (Signed)
Ultrasound-guided diagnostic and therapeutic paracentesis performed yielding 5.5 liters of yellow colored fluid. No immediate complications.   Donald Moreno E 11:24 AM 07/15/2016

## 2016-07-18 ENCOUNTER — Other Ambulatory Visit (HOSPITAL_COMMUNITY): Payer: Self-pay | Admitting: Gastroenterology

## 2016-07-19 ENCOUNTER — Other Ambulatory Visit (HOSPITAL_COMMUNITY): Payer: Self-pay | Admitting: Gastroenterology

## 2016-07-19 DIAGNOSIS — R188 Other ascites: Secondary | ICD-10-CM

## 2016-07-19 DIAGNOSIS — K7460 Unspecified cirrhosis of liver: Secondary | ICD-10-CM

## 2016-07-22 ENCOUNTER — Ambulatory Visit (HOSPITAL_COMMUNITY)
Admission: RE | Admit: 2016-07-22 | Discharge: 2016-07-22 | Disposition: A | Discharge status: Home / Self Care | Payer: Medicare Other | Source: Ambulatory Visit | Attending: Gastroenterology | Admitting: Gastroenterology

## 2016-07-22 DIAGNOSIS — R188 Other ascites: Secondary | ICD-10-CM | POA: Diagnosis not present

## 2016-07-22 DIAGNOSIS — K7460 Unspecified cirrhosis of liver: Secondary | ICD-10-CM | POA: Diagnosis not present

## 2016-07-22 MED ORDER — LIDOCAINE HCL (PF) 1 % IJ SOLN
INTRAMUSCULAR | Status: AC
  Filled 2016-07-22: qty 10

## 2016-07-22 MED ORDER — ALBUMIN HUMAN 25 % IV SOLN
50.0000 g | Freq: Once | INTRAVENOUS | Status: AC
  Administered 2016-07-22: 50 g via INTRAVENOUS
  Filled 2016-07-22: qty 200

## 2016-07-22 NOTE — Procedures (Signed)
PROCEDURE SUMMARY:  Successful US guided paracentesis from right lateral abdomen.  Yielded 4.1L of clear, yellow fluid.  No immediate complications.  Pt tolerated well.   Specimen was sent for labs. 50g albumin ordered post-procedure.  Docia Barrier PA-C 07/22/2016 11:09 AM

## 2016-07-29 ENCOUNTER — Ambulatory Visit (HOSPITAL_COMMUNITY)
Admission: RE | Admit: 2016-07-29 | Discharge: 2016-07-29 | Disposition: A | Discharge status: Home / Self Care | Payer: Medicare Other | Source: Ambulatory Visit | Attending: Gastroenterology | Admitting: Gastroenterology

## 2016-07-29 DIAGNOSIS — R188 Other ascites: Secondary | ICD-10-CM | POA: Diagnosis not present

## 2016-07-29 DIAGNOSIS — K7460 Unspecified cirrhosis of liver: Secondary | ICD-10-CM | POA: Diagnosis not present

## 2016-07-29 MED ORDER — ALBUMIN HUMAN 25 % IV SOLN
INTRAVENOUS | Status: AC
  Administered 2016-07-29: 50 g via INTRAVENOUS
  Filled 2016-07-29: qty 200

## 2016-07-29 MED ORDER — ALBUMIN HUMAN 25 % IV SOLN
50.0000 g | Freq: Once | INTRAVENOUS | Status: AC
  Administered 2016-07-29: 50 g via INTRAVENOUS
  Filled 2016-07-29: qty 200

## 2016-07-29 MED ORDER — LIDOCAINE HCL (PF) 1 % IJ SOLN
INTRAMUSCULAR | Status: AC
  Filled 2016-07-29: qty 10

## 2016-07-29 NOTE — Procedures (Signed)
PROCEDURE SUMMARY:  Successful US guided paracentesis from right lateral abdomen.  Yielded 4.4L of clear, yellow fluid.  No immediate complications.  Pt tolerated well.   Specimen was sent for labs. 50g albumin ordered post-procedure.  Docia Barrier PA-C 07/29/2016 11:02 AM

## 2016-08-05 ENCOUNTER — Ambulatory Visit (HOSPITAL_COMMUNITY)
Admission: RE | Admit: 2016-08-05 | Discharge: 2016-08-05 | Disposition: A | Discharge status: Home / Self Care | Payer: Medicare Other | Source: Ambulatory Visit | Attending: Gastroenterology | Admitting: Gastroenterology

## 2016-08-05 DIAGNOSIS — R188 Other ascites: Secondary | ICD-10-CM | POA: Insufficient documentation

## 2016-08-05 DIAGNOSIS — K7460 Unspecified cirrhosis of liver: Secondary | ICD-10-CM | POA: Diagnosis not present

## 2016-08-05 MED ORDER — ALBUMIN HUMAN 25 % IV SOLN
50.0000 g | Freq: Once | INTRAVENOUS | Status: AC
  Administered 2016-08-05: 50 g via INTRAVENOUS
  Filled 2016-08-05: qty 200

## 2016-08-05 MED ORDER — ALBUMIN HUMAN 25 % IV SOLN
INTRAVENOUS | Status: AC
  Filled 2016-08-05: qty 200

## 2016-08-05 MED ORDER — LIDOCAINE HCL (PF) 1 % IJ SOLN
INTRAMUSCULAR | Status: AC
  Filled 2016-08-05: qty 10

## 2016-08-05 NOTE — Procedures (Signed)
PROCEDURE SUMMARY:  Successful US guided paracentesis from right lateral abdomen.  Yielded 5.8 liters of clear, yellow fluid.  No immediate complications.  Pt tolerated well.   Specimen was sent for labs. Patient for 50g albumin post-procedure  Docia Barrier PA-C 08/05/2016 1:55 PM

## 2016-08-12 ENCOUNTER — Ambulatory Visit (HOSPITAL_COMMUNITY)
Admission: RE | Admit: 2016-08-12 | Discharge: 2016-08-12 | Disposition: A | Discharge status: Home / Self Care | Payer: Medicare Other | Source: Ambulatory Visit | Attending: Gastroenterology | Admitting: Gastroenterology

## 2016-08-12 DIAGNOSIS — R188 Other ascites: Secondary | ICD-10-CM | POA: Insufficient documentation

## 2016-08-12 DIAGNOSIS — K7460 Unspecified cirrhosis of liver: Secondary | ICD-10-CM | POA: Insufficient documentation

## 2016-08-12 MED ORDER — ALBUMIN HUMAN 25 % IV SOLN
INTRAVENOUS | Status: AC
  Administered 2016-08-12: 50 g via INTRAVENOUS
  Filled 2016-08-12: qty 200

## 2016-08-12 MED ORDER — ALBUMIN HUMAN 25 % IV SOLN
50.0000 g | Freq: Once | INTRAVENOUS | Status: AC
  Administered 2016-08-12: 50 g via INTRAVENOUS

## 2016-08-12 MED ORDER — LIDOCAINE HCL 1 % IJ SOLN
INTRAMUSCULAR | Status: AC
  Filled 2016-08-12: qty 20

## 2016-08-12 NOTE — Procedures (Signed)
PROCEDURE SUMMARY:  Successful US guided paracentesis from right lateral abdomen.  Yielded 5.0L of clear, yellow fluid.  No immediate complications.  Pt tolerated well.   Specimen was sent for labs.  Docia Barrier PA-C 08/12/2016 11:49 AM

## 2016-08-19 ENCOUNTER — Other Ambulatory Visit (HOSPITAL_COMMUNITY): Payer: Self-pay | Admitting: Gastroenterology

## 2016-08-19 ENCOUNTER — Encounter (HOSPITAL_COMMUNITY): Payer: Self-pay | Admitting: Student

## 2016-08-19 ENCOUNTER — Ambulatory Visit (HOSPITAL_COMMUNITY)
Admission: RE | Admit: 2016-08-19 | Discharge: 2016-08-19 | Disposition: A | Discharge status: Home / Self Care | Payer: Medicare Other | Source: Ambulatory Visit | Attending: Gastroenterology | Admitting: Gastroenterology

## 2016-08-19 DIAGNOSIS — K7460 Unspecified cirrhosis of liver: Secondary | ICD-10-CM

## 2016-08-19 DIAGNOSIS — R188 Other ascites: Secondary | ICD-10-CM | POA: Insufficient documentation

## 2016-08-19 HISTORY — PX: IR PARACENTESIS: IMG2679

## 2016-08-19 MED ORDER — LIDOCAINE HCL (PF) 1 % IJ SOLN
INTRAMUSCULAR | Status: AC
  Filled 2016-08-19: qty 10

## 2016-08-19 MED ORDER — LIDOCAINE HCL 1 % IJ SOLN
INTRAMUSCULAR | Status: DC | PRN
  Administered 2016-08-19: 10 mL

## 2016-08-19 MED ORDER — ALBUMIN HUMAN 25 % IV SOLN
50.0000 g | Freq: Once | INTRAVENOUS | Status: AC
  Administered 2016-08-19: 50 g via INTRAVENOUS
  Filled 2016-08-19: qty 200

## 2016-08-19 NOTE — Procedures (Signed)
PROCEDURE SUMMARY:  Successful US guided paracentesis from right lateral abdomen.  Yielded 5.6 L of clear, yellow fluid.  No immediate complications.  Pt tolerated well.   Specimen was sent for labs.  Docia Barrier PA-C 08/19/2016 11:37 AM

## 2016-08-25 DIAGNOSIS — R188 Other ascites: Secondary | ICD-10-CM | POA: Diagnosis not present

## 2016-08-25 DIAGNOSIS — K7460 Unspecified cirrhosis of liver: Secondary | ICD-10-CM | POA: Diagnosis not present

## 2016-08-26 ENCOUNTER — Encounter (HOSPITAL_COMMUNITY): Payer: Self-pay | Admitting: Interventional Radiology

## 2016-08-26 ENCOUNTER — Other Ambulatory Visit (HOSPITAL_COMMUNITY): Payer: Self-pay | Admitting: Gastroenterology

## 2016-08-26 ENCOUNTER — Ambulatory Visit (HOSPITAL_COMMUNITY)
Admission: RE | Admit: 2016-08-26 | Discharge: 2016-08-26 | Disposition: A | Discharge status: Home / Self Care | Payer: Medicare Other | Source: Ambulatory Visit | Attending: Gastroenterology | Admitting: Gastroenterology

## 2016-08-26 DIAGNOSIS — R188 Other ascites: Secondary | ICD-10-CM

## 2016-08-26 DIAGNOSIS — K7460 Unspecified cirrhosis of liver: Secondary | ICD-10-CM

## 2016-08-26 HISTORY — PX: IR PARACENTESIS: IMG2679

## 2016-08-26 MED ORDER — ALBUMIN HUMAN 25 % IV SOLN
50.0000 g | Freq: Once | INTRAVENOUS | Status: AC
  Administered 2016-08-26: 50 g via INTRAVENOUS
  Filled 2016-08-26: qty 200

## 2016-08-26 MED ORDER — LIDOCAINE HCL 1 % IJ SOLN
INTRAMUSCULAR | Status: AC
  Filled 2016-08-26: qty 20

## 2016-08-26 MED ORDER — ALBUMIN HUMAN 5 % IV SOLN: 50.0000 g | Freq: Once | INTRAVENOUS | Status: DC

## 2016-08-26 NOTE — Procedures (Signed)
Pre Procedural Dx: Symptomatic Ascites Post Procedural Dx: Same  Successful US guided paracentesis yielding 4.8 L of serous ascitic fluid. Sample sent to laboratory as requested.  EBL: None  Complications: None immediate  Ronny Bacon, MD Pager #: [phone]

## 2016-09-02 ENCOUNTER — Ambulatory Visit (HOSPITAL_COMMUNITY)
Admission: RE | Admit: 2016-09-02 | Discharge: 2016-09-02 | Disposition: A | Discharge status: Home / Self Care | Payer: Medicare Other | Source: Ambulatory Visit | Attending: Gastroenterology | Admitting: Gastroenterology

## 2016-09-02 ENCOUNTER — Other Ambulatory Visit (HOSPITAL_COMMUNITY): Payer: Self-pay | Admitting: Gastroenterology

## 2016-09-02 ENCOUNTER — Encounter (HOSPITAL_COMMUNITY): Payer: Self-pay

## 2016-09-02 DIAGNOSIS — R188 Other ascites: Secondary | ICD-10-CM | POA: Diagnosis not present

## 2016-09-02 DIAGNOSIS — K7460 Unspecified cirrhosis of liver: Secondary | ICD-10-CM

## 2016-09-02 HISTORY — PX: IR PARACENTESIS: IMG2679

## 2016-09-02 MED ORDER — ALBUMIN HUMAN 25 % IV SOLN
50.0000 g | Freq: Once | INTRAVENOUS | Status: AC
  Administered 2016-09-02: 50 g via INTRAVENOUS
  Filled 2016-09-02: qty 200

## 2016-09-02 MED ORDER — LIDOCAINE HCL 1 % IJ SOLN
INTRAMUSCULAR | Status: AC
  Filled 2016-09-02: qty 20

## 2016-09-02 MED ORDER — LIDOCAINE HCL (PF) 1 % IJ SOLN
INTRAMUSCULAR | Status: DC | PRN
  Administered 2016-09-02: 5 mL

## 2016-09-02 NOTE — Procedures (Signed)
PROCEDURE SUMMARY:  Successful US guided paracentesis from RLQ.  Yielded 6 L of hazy yellow fluid.  No immediate complications.  Pt tolerated well.   Specimen was sent for labs.  Ascencion Dike PA-C 09/02/2016 10:19 AM

## 2016-09-02 NOTE — Progress Notes (Signed)
Post para and Albumin VS

## 2016-09-09 ENCOUNTER — Ambulatory Visit (HOSPITAL_COMMUNITY)
Admission: RE | Admit: 2016-09-09 | Discharge: 2016-09-09 | Disposition: A | Discharge status: Home / Self Care | Payer: Medicare Other | Source: Ambulatory Visit | Attending: Gastroenterology | Admitting: Gastroenterology

## 2016-09-09 ENCOUNTER — Encounter (HOSPITAL_COMMUNITY): Payer: Self-pay | Admitting: Student

## 2016-09-09 DIAGNOSIS — R188 Other ascites: Secondary | ICD-10-CM

## 2016-09-09 DIAGNOSIS — K7460 Unspecified cirrhosis of liver: Secondary | ICD-10-CM

## 2016-09-09 DIAGNOSIS — K660 Peritoneal adhesions (postprocedural) (postinfection): Secondary | ICD-10-CM | POA: Diagnosis not present

## 2016-09-09 HISTORY — PX: IR PARACENTESIS: IMG2679

## 2016-09-09 MED ORDER — ALBUMIN HUMAN 25 % IV SOLN
50.0000 g | Freq: Once | INTRAVENOUS | Status: AC
  Administered 2016-09-09: 50 g via INTRAVENOUS
  Filled 2016-09-09: qty 200

## 2016-09-09 MED ORDER — LIDOCAINE HCL 1 % IJ SOLN
INTRAMUSCULAR | Status: AC
  Filled 2016-09-09: qty 20

## 2016-09-09 MED ORDER — LIDOCAINE HCL 1 % IJ SOLN
INTRAMUSCULAR | Status: DC | PRN
  Administered 2016-09-09: 5 mL

## 2016-09-09 NOTE — Procedures (Signed)
PROCEDURE SUMMARY:  Successful US guided paracentesis from right lateral abdomen.  Yielded 5.8L of clear, yellow fluid.  No immediate complications.  Pt tolerated well.   Specimen was sent for labs.  Docia Barrier PA-C 09/09/2016 10:59 AM

## 2016-09-13 ENCOUNTER — Other Ambulatory Visit (HOSPITAL_COMMUNITY): Payer: Self-pay | Admitting: Gastroenterology

## 2016-09-13 DIAGNOSIS — R188 Other ascites: Secondary | ICD-10-CM

## 2016-09-15 ENCOUNTER — Other Ambulatory Visit: Payer: Self-pay | Admitting: Family Medicine

## 2016-09-15 ENCOUNTER — Other Ambulatory Visit: Payer: Self-pay | Admitting: Obstetrics and Gynecology

## 2016-09-16 ENCOUNTER — Other Ambulatory Visit: Payer: Self-pay | Admitting: *Deleted

## 2016-09-16 ENCOUNTER — Encounter (HOSPITAL_COMMUNITY): Payer: Self-pay | Admitting: General Surgery

## 2016-09-16 ENCOUNTER — Ambulatory Visit (HOSPITAL_COMMUNITY)
Admission: RE | Admit: 2016-09-16 | Discharge: 2016-09-16 | Disposition: A | Discharge status: Home / Self Care | Payer: Medicare Other | Source: Ambulatory Visit | Attending: Gastroenterology | Admitting: Gastroenterology

## 2016-09-16 ENCOUNTER — Other Ambulatory Visit (HOSPITAL_COMMUNITY): Payer: Self-pay | Admitting: Gastroenterology

## 2016-09-16 DIAGNOSIS — R188 Other ascites: Secondary | ICD-10-CM | POA: Diagnosis not present

## 2016-09-16 DIAGNOSIS — K7031 Alcoholic cirrhosis of liver with ascites: Secondary | ICD-10-CM | POA: Insufficient documentation

## 2016-09-16 HISTORY — PX: IR PARACENTESIS: IMG2679

## 2016-09-16 MED ORDER — LIDOCAINE HCL (PF) 1 % IJ SOLN
INTRAMUSCULAR | Status: DC | PRN
  Administered 2016-09-16: 10 mL

## 2016-09-16 MED ORDER — ALLOPURINOL 300 MG PO TABS: 300.0000 mg | ORAL_TABLET | Freq: Every day | ORAL | 0 refills | Status: DC

## 2016-09-16 MED ORDER — ALBUMIN HUMAN 25 % IV SOLN
50.0000 g | Freq: Once | INTRAVENOUS | Status: AC
  Administered 2016-09-16: 50 g via INTRAVENOUS
  Filled 2016-09-16: qty 200

## 2016-09-16 MED ORDER — LIDOCAINE HCL 1 % IJ SOLN
INTRAMUSCULAR | Status: AC
  Filled 2016-09-16: qty 20

## 2016-09-16 NOTE — Procedures (Signed)
Ultrasound-guided therapeutic paracentesis performed yielding 4.7 liters of yellow colored fluid. No immediate complications.  Keaston Pile E 1:10 PM 09/16/2016

## 2016-09-20 DIAGNOSIS — R188 Other ascites: Secondary | ICD-10-CM | POA: Diagnosis not present

## 2016-09-20 DIAGNOSIS — I8500 Esophageal varices without bleeding: Secondary | ICD-10-CM | POA: Diagnosis not present

## 2016-09-20 DIAGNOSIS — K7460 Unspecified cirrhosis of liver: Secondary | ICD-10-CM | POA: Diagnosis not present

## 2016-09-22 ENCOUNTER — Other Ambulatory Visit: Payer: Self-pay | Admitting: Gastroenterology

## 2016-09-22 DIAGNOSIS — K7031 Alcoholic cirrhosis of liver with ascites: Secondary | ICD-10-CM

## 2016-09-29 ENCOUNTER — Other Ambulatory Visit: Payer: Self-pay | Admitting: Radiology

## 2016-09-29 ENCOUNTER — Other Ambulatory Visit (HOSPITAL_COMMUNITY): Payer: Self-pay | Admitting: Interventional Radiology

## 2016-09-29 ENCOUNTER — Ambulatory Visit
Admission: RE | Admit: 2016-09-29 | Discharge: 2016-09-29 | Disposition: A | Discharge status: Home / Self Care | Payer: Medicare Other | Source: Ambulatory Visit | Attending: Gastroenterology | Admitting: Gastroenterology

## 2016-09-29 DIAGNOSIS — K766 Portal hypertension: Secondary | ICD-10-CM | POA: Diagnosis not present

## 2016-09-29 DIAGNOSIS — K7031 Alcoholic cirrhosis of liver with ascites: Secondary | ICD-10-CM

## 2016-09-29 DIAGNOSIS — R188 Other ascites: Secondary | ICD-10-CM | POA: Diagnosis not present

## 2016-09-29 HISTORY — PX: IR RADIOLOGIST EVAL & MGMT: IMG5224

## 2016-09-29 NOTE — Consult Note (Signed)
Chief Complaint: Patient was seen in consultation today for  Chief Complaint  Patient presents with  . Advice Only    Consult for TIPS     at the request of Outlaw,William  Referring Physician(s): Outlaw,William  History of Present Illness: Donald Moreno is a 76 y.o. male known to our service from multiple visits for ultrasound-guided paracentesis, he's had this done 18 times this calendar year.  He has cirrhosis and portal venous hypertension with recurrent large-volume abdominal ascites. Medical history also significant for renal insufficiency and atrial fibrillation. He has refused liver transplant evaluation.  Past Medical History:  Diagnosis Date  . Arthritis   . Asthma   . ETOH abuse   . Gout   . Hypertension   . Liver cirrhosis ([place]) 2015  . Spontaneous bacterial peritonitis ([place]) 07/01/2015    Past Surgical History:  Procedure Laterality Date  . IR PARACENTESIS  08/19/2016  . IR PARACENTESIS  08/26/2016  . IR PARACENTESIS  09/02/2016  . IR PARACENTESIS  09/09/2016  . IR PARACENTESIS  09/16/2016    Allergies: Patient has no known allergies.  Medications: Prior to Admission medications   Medication Sig Start Date End Date Taking? Authorizing Provider  ADVAIR DISKUS 500-50 MCG/DOSE AEPB INHALE 1 PUFF TWO TIMES  DAILY 09/16/16  Yes Katheren Shams, DO  allopurinol (ZYLOPRIM) 300 MG tablet Take 1 tablet (300 mg total) by mouth daily. 09/16/16  Yes Luiz Blare Y, DO  Cholecalciferol (VITAMIN D3) 1000 UNITS CAPS Take 1 capsule (1,000 Units total) by mouth daily. 03/05/15  Yes Luiz Blare Y, DO  ferrous sulfate 325 (65 FE) MG tablet Take 1 tablet (325 mg total) by mouth 2 (two) times daily with a meal. Patient taking differently: Take 325 mg by mouth daily with breakfast.  10/21/14  Yes Gottschalk, Ashly M, DO  montelukast (SINGULAIR) 10 MG tablet Take 1 tablet (10 mg total) by mouth at bedtime. 09/23/15  Yes Luiz Blare Y, DO  Nutritional Supplements (ENSURE COMPLETE  SHAKE) LIQD Take 1 Container by mouth daily. 03/04/15  Yes Katheren Shams, DO  PROAIR HFA 108 [phone] Base) MCG/ACT inhaler Use 2 puffs every 4 hours  as needed for wheezing or  shortness of breath 11/26/15  Yes Haney, Amedeo Plenty, MD     Family History  Problem Relation Age of Onset  . Other Mother        died when pt was only 67 - unknown cause.  . Other Father        deceased.  . Asthma Sister   . Heart disease Sister        multiple stents.  . Asthma Brother   . Hypertension Brother   . Asthma Son   . Asthma Brother   . Hypertension Brother     Social History   Social History  . Marital status: Divorced    Spouse name: N/A  . Number of children: 5  . Years of education: 10   Occupational History  . Retired- Administrator    Social History Main Topics  . Smoking status: Never Smoker  . Smokeless tobacco: Current User    Types: Snuff  . Alcohol use No     Comment: quit drinking about 2 years ago  . Drug use: No  . Sexual activity: Not Currently   Other Topics Concern  . Not on file   Social History Narrative   Lives with nephew and niece in [place] home.     Tobacco:  snuff all day (can last two days. Never smoked.       Hobbies: cooking, sleeping, going to store   Pets:  Dog, Bean    ECOG Status: 1 - Symptomatic but completely ambulatory  Review of Systems: A 12 point ROS discussed and pertinent positives are indicated in the HPI above.  All other systems are negative.  Review of Systems  Vital Signs: BP (!) 159/76   Pulse 61   Temp 98.6 F (37 C) (Oral)   Resp 14   Ht 5\' 10"  (1.778 m)   Wt 155 lb (70.3 kg)   SpO2 100%   BMI 22.24 kg/m   Physical Exam   Constitutional: Oriented to person, place, and time.   appears well-developed and well-nourished. No distress.  HENT:  Head: Normocephalic and atraumatic.  Eyes: Conjunctivae and EOM are normal. Right eye exhibits no discharge. Left eye exhibits no discharge. No scleral icterus.  Neck: No JVD  present.  Pulmonary/Chest: Effort normal. No stridor. No respiratory distress.  Abdomen: Protuberant, nontender  Neurological:   alert and oriented to person, place, and time.  Skin: Skin is warm and dry.   not diaphoretic.  Psychiatric:  normal mood and affect. Her behavior is normal. Judgment and thought content normal.   Mallampati Score:     Imaging: Ir Paracentesis  Result Date: 09/16/2016 INDICATION: Patient with a history of alcoholic cirrhosis and recurrent abdominal ascites. Request is made for therapeutic paracentesis. He is given a maximum of 6-7 L and is to receive albumin afterwards. EXAM: ULTRASOUND GUIDED THERAPEUTIC PARACENTESIS MEDICATIONS: 1% lidocaine COMPLICATIONS: None immediate. PROCEDURE: Informed written consent was obtained from the patient after a discussion of the risks, benefits and alternatives to treatment. A timeout was performed prior to the initiation of the procedure. Initial ultrasound scanning demonstrates a moderate amount of ascites within the right lower abdominal quadrant. The right lower abdomen was prepped and draped in the usual sterile fashion. 1% lidocaine was used for local anesthesia. Following this, a 19 gauge, 7-cm, Yueh catheter was introduced. An ultrasound image was saved for documentation purposes. The paracentesis was performed. The catheter was removed and a dressing was applied. The patient tolerated the procedure well without immediate post procedural complication. FINDINGS: A total of approximately 4.7 L of yellow fluid was removed. IMPRESSION: Successful ultrasound-guided paracentesis yielding 4.7 liters of peritoneal fluid. Read by: Saverio Danker, PA-C Electronically Signed   By: Aletta Edouard M.D.   On: 09/16/2016 13:13   Ir Paracentesis  Result Date: 09/09/2016 INDICATION: Patient with history of ascites. Request is made for diagnostic and therapeutic paracentesis. EXAM: ULTRASOUND GUIDED DIAGNOSTIC AND THERAPEUTIC PARACENTESIS  MEDICATIONS: 1% lidocaine, 10 mL COMPLICATIONS: None immediate. PROCEDURE: Informed written consent was obtained from the patient after a discussion of the risks, benefits and alternatives to treatment. A timeout was performed prior to the initiation of the procedure. Initial ultrasound scanning demonstrates a large amount of ascites within the right lateral abdomen. The right lateral abdomen was prepped and draped in the usual sterile fashion. 1% lidocaine was used for local anesthesia. Following this, a 19 gauge, 7-cm, Yueh catheter was introduced. An ultrasound image was saved for documentation purposes. The paracentesis was performed. The catheter was removed and a dressing was applied. The patient tolerated the procedure well without immediate post procedural complication. FINDINGS: A total of approximately 5.8 liters of clear, yellow fluid was removed. Samples were sent to the laboratory as requested by the clinical team. IMPRESSION: Successful ultrasound-guided diagnostic and therapeutic  paracentesis yielding 5.8 liters of peritoneal fluid. Read by:  Brynda Greathouse PA-C Electronically Signed   By: Corrie Mckusick D.O.   On: 09/09/2016 11:02   Ir Paracentesis  Result Date: 09/02/2016 INDICATION: Abdominal distention secondary to ascites. Request diagnostic and therapeutic paracentesis EXAM: ULTRASOUND GUIDED RIGHT LOWER QUADRANT PARACENTESIS MEDICATIONS: None. COMPLICATIONS: None immediate. PROCEDURE: Informed written consent was obtained from the patient after a discussion of the risks, benefits and alternatives to treatment. A timeout was performed prior to the initiation of the procedure. Initial ultrasound scanning demonstrates a large amount of ascites within the right lower abdominal quadrant. The right lower abdomen was prepped and draped in the usual sterile fashion. 1% lidocaine with epinephrine was used for local anesthesia. Following this, a 19 gauge, 7-cm, Yueh catheter was introduced. An  ultrasound image was saved for documentation purposes. The paracentesis was performed. The catheter was removed and a dressing was applied. The patient tolerated the procedure well without immediate post procedural complication. FINDINGS: A total of approximately 6 L of clear yellow fluid was removed. Samples were sent to the laboratory as requested by the clinical team. IMPRESSION: Successful ultrasound-guided paracentesis yielding 6 liters of peritoneal fluid. Read by: Ascencion Dike PA-C Electronically Signed   By: Corrie Mckusick D.O.   On: 09/02/2016 11:19    Labs:  CBC:  Recent Labs  06/19/16 0247 06/20/16 0503  WBC 6.9 5.4  HGB 13.1 11.5*  HCT 38.5* 34.2*  PLT 125* 123*    COAGS:  Recent Labs  06/20/16 0503  INR 1.25    BMP:  Recent Labs  06/19/16 0247 06/20/16 0503  NA 140 138  K 4.6 4.6  CL 108 110  CO2 25 24  GLUCOSE 95 86  BUN 21* 22*  CALCIUM 9.0 8.5*  CREATININE 1.87* 1.56*  GFRNONAA 34* 42*  GFRAA 39* 49*    LIVER FUNCTION TESTS:  Recent Labs  06/19/16 0247 06/20/16 0503  BILITOT 0.8 1.0  AST 24 20  ALT 13* 12*  ALKPHOS 80 63  PROT 6.6 5.4*  ALBUMIN 3.7 2.9*   MELD score 14 Childs Pugh class B  TUMOR MARKERS: No results for input(s): AFPTM, CEA, CA199, CHROMGRNA in the last 8760 hours.  Assessment and Plan:  My impression is that this patient with cirrhosis and portal venous hypertension has recurrent large volume ascites requiring frequent paracentesis. He would likely be an candidate for elective T IPS creation to control abdominal ascites. To complete his evaluation, I would require a  pre-TIPS liver vascular Doppler ultrasound to confirm patency of the portal venous system, as it has not been studied since 2016.  We spent the majority of the consultation discussing the pathophysiology of cirrhosis, portal venous hypertension, and associated recurrent large volume ascites. We reviewed the irreversibility of cirrhosis without  transplantation. We discussed the physiology of the TIPS. We discussed the placement technique with anesthesia. We reviewed the anticipated benefits, possible risks and complications, and alternatives. We reviewed the postop course. We discussed the possible need for by mouth lactulose to prevent hepatic encephalopathy. We discussed the variable effect on hepatic dysfunction post TIPS. We discussed the need for ultrasound surveillance post TIPS.   The patient seemed to understand. He has good attitude about his situation. He is motivated to proceed with TIPS creation, in order to control the recurrent large volume abdominal ascites and decrease the need for repeated paracentesis. Accordingly, we'll go ahead and set up the liver Doppler ultrasound. Assuming this looks okay, we can  coordinate with anesthesia for elective TIPS at [facility] at his convenience, planning for overnight observation.  Thank you for this interesting consult.  I greatly enjoyed meeting Donald Moreno and look forward to participating in their care.  A copy of this report was sent to the requesting provider on this date.  Electronically Signed: Akeira Lahm III, DAYNE Janna Oak 09/29/2016, 3:47 PM   I spent a total of  40 Minutes   in face to face in clinical consultation, greater than 50% of which was counseling/coordinating care for cirrhosis, portal venous hypertension, and recurrent large volume ascites.

## 2016-09-30 LAB — COMPLETE METABOLIC PANEL WITH GFR
ALT: 9 U/L (ref 9–46)
AST: 22 U/L (ref 10–35)
Albumin: 3.7 g/dL (ref 3.6–5.1)
Alkaline Phosphatase: 88 U/L (ref 40–115)
BUN: 13 mg/dL (ref 7–25)
CALCIUM: 8.9 mg/dL (ref 8.6–10.3)
CHLORIDE: 107 mmol/L (ref 98–110)
CO2: 20 mmol/L (ref 20–31)
CREATININE: 1.19 mg/dL — AB (ref 0.70–1.18)
GFR, EST AFRICAN AMERICAN: 69 mL/min (ref >=60)
GFR, Est Non African American: 60 mL/min (ref >=60)
Glucose, Bld: 78 mg/dL (ref 65–99)
POTASSIUM: 4 mmol/L (ref 3.5–5.3)
Sodium: 140 mmol/L (ref 135–146)
Total Bilirubin: 1.1 mg/dL (ref 0.2–1.2)
Total Protein: 6.4 g/dL (ref 6.1–8.1)

## 2016-10-03 ENCOUNTER — Encounter (HOSPITAL_COMMUNITY): Payer: Self-pay | Admitting: Student

## 2016-10-03 ENCOUNTER — Ambulatory Visit (HOSPITAL_COMMUNITY)
Admission: RE | Admit: 2016-10-03 | Discharge: 2016-10-03 | Disposition: A | Discharge status: Home / Self Care | Payer: Medicare Other | Source: Ambulatory Visit | Attending: Interventional Radiology | Admitting: Interventional Radiology

## 2016-10-03 DIAGNOSIS — K7031 Alcoholic cirrhosis of liver with ascites: Secondary | ICD-10-CM

## 2016-10-03 DIAGNOSIS — R188 Other ascites: Secondary | ICD-10-CM | POA: Insufficient documentation

## 2016-10-03 DIAGNOSIS — K7460 Unspecified cirrhosis of liver: Secondary | ICD-10-CM | POA: Diagnosis not present

## 2016-10-03 HISTORY — PX: IR PARACENTESIS: IMG2679

## 2016-10-03 MED ORDER — LIDOCAINE HCL 1 % IJ SOLN
INTRAMUSCULAR | Status: AC
  Filled 2016-10-03: qty 20

## 2016-10-03 MED ORDER — LIDOCAINE HCL 1 % IJ SOLN
INTRAMUSCULAR | Status: DC | PRN
  Administered 2016-10-03: 10 mL

## 2016-10-03 MED ORDER — ALBUMIN HUMAN 25 % IV SOLN
50.0000 g | Freq: Once | INTRAVENOUS | Status: DC
  Filled 2016-10-03: qty 200

## 2016-10-03 MED ORDER — ALBUMIN HUMAN 25 % IV SOLN
50.0000 g | Freq: Once | INTRAVENOUS | Status: AC
  Administered 2016-10-03: 50 g via INTRAVENOUS

## 2016-10-03 NOTE — Procedures (Signed)
PROCEDURE SUMMARY:  Successful US guided paracentesis from right lateral abdomen.  Yielded 7.0L of clear, yellow fluid.  No immediate complications.  Pt tolerated well.   Specimen was not sent for labs.  Patient is to receive albumin post-procedure.  Docia Barrier PA-C 10/03/2016 10:57 AM

## 2016-10-06 ENCOUNTER — Other Ambulatory Visit (HOSPITAL_COMMUNITY): Payer: Self-pay | Admitting: Interventional Radiology

## 2016-10-06 DIAGNOSIS — K7031 Alcoholic cirrhosis of liver with ascites: Secondary | ICD-10-CM

## 2016-10-07 ENCOUNTER — Other Ambulatory Visit: Payer: Self-pay | Admitting: General Surgery

## 2016-10-07 ENCOUNTER — Other Ambulatory Visit: Payer: Self-pay | Admitting: Radiology

## 2016-10-11 ENCOUNTER — Ambulatory Visit (HOSPITAL_COMMUNITY)
Admission: RE | Admit: 2016-10-11 | Discharge: 2016-10-12 | Disposition: A | Discharge status: Home / Self Care | Payer: Medicare Other | Source: Ambulatory Visit | Attending: Interventional Radiology | Admitting: Interventional Radiology

## 2016-10-11 ENCOUNTER — Encounter (HOSPITAL_COMMUNITY)
Admission: RE | Disposition: A | Discharge status: Home / Self Care | Payer: Self-pay | Source: Ambulatory Visit | Attending: Interventional Radiology

## 2016-10-11 ENCOUNTER — Encounter (HOSPITAL_COMMUNITY): Payer: Self-pay | Admitting: *Deleted

## 2016-10-11 ENCOUNTER — Inpatient Hospital Stay (HOSPITAL_COMMUNITY): Payer: Medicare Other | Admitting: Certified Registered Nurse Anesthetist

## 2016-10-11 ENCOUNTER — Ambulatory Visit (HOSPITAL_COMMUNITY)
Admission: RE | Admit: 2016-10-11 | Discharge: 2016-10-11 | Disposition: A | Discharge status: Home / Self Care | Payer: Medicare Other | Source: Ambulatory Visit | Attending: Interventional Radiology | Admitting: Interventional Radiology

## 2016-10-11 DIAGNOSIS — Z7951 Long term (current) use of inhaled steroids: Secondary | ICD-10-CM | POA: Insufficient documentation

## 2016-10-11 DIAGNOSIS — K7031 Alcoholic cirrhosis of liver with ascites: Secondary | ICD-10-CM

## 2016-10-11 DIAGNOSIS — K766 Portal hypertension: Secondary | ICD-10-CM | POA: Diagnosis not present

## 2016-10-11 DIAGNOSIS — I4891 Unspecified atrial fibrillation: Secondary | ICD-10-CM | POA: Diagnosis not present

## 2016-10-11 DIAGNOSIS — Z720 Tobacco use: Secondary | ICD-10-CM | POA: Diagnosis not present

## 2016-10-11 DIAGNOSIS — K7460 Unspecified cirrhosis of liver: Secondary | ICD-10-CM | POA: Diagnosis present

## 2016-10-11 DIAGNOSIS — M109 Gout, unspecified: Secondary | ICD-10-CM | POA: Diagnosis not present

## 2016-10-11 DIAGNOSIS — J45909 Unspecified asthma, uncomplicated: Secondary | ICD-10-CM | POA: Diagnosis not present

## 2016-10-11 DIAGNOSIS — R188 Other ascites: Secondary | ICD-10-CM | POA: Diagnosis not present

## 2016-10-11 DIAGNOSIS — M1990 Unspecified osteoarthritis, unspecified site: Secondary | ICD-10-CM | POA: Insufficient documentation

## 2016-10-11 DIAGNOSIS — I129 Hypertensive chronic kidney disease with stage 1 through stage 4 chronic kidney disease, or unspecified chronic kidney disease: Secondary | ICD-10-CM | POA: Diagnosis not present

## 2016-10-11 DIAGNOSIS — N289 Disorder of kidney and ureter, unspecified: Secondary | ICD-10-CM | POA: Insufficient documentation

## 2016-10-11 DIAGNOSIS — I10 Essential (primary) hypertension: Secondary | ICD-10-CM | POA: Diagnosis not present

## 2016-10-11 DIAGNOSIS — N183 Chronic kidney disease, stage 3 (moderate): Secondary | ICD-10-CM | POA: Diagnosis not present

## 2016-10-11 HISTORY — PX: IR TIPS: IMG2295

## 2016-10-11 HISTORY — PX: RADIOLOGY WITH ANESTHESIA: SHX6223

## 2016-10-11 HISTORY — DX: Low back pain: M54.5

## 2016-10-11 HISTORY — DX: Other chronic pain: G89.29

## 2016-10-11 HISTORY — DX: Pneumonia, unspecified organism: J18.9

## 2016-10-11 HISTORY — DX: Low back pain, unspecified: M54.50

## 2016-10-11 HISTORY — PX: IR PARACENTESIS: IMG2679

## 2016-10-11 HISTORY — DX: Chronic kidney disease, unspecified: N18.9

## 2016-10-11 LAB — COMPREHENSIVE METABOLIC PANEL
ALK PHOS: 71 U/L (ref 38–126)
ALT: 14 U/L — ABNORMAL LOW (ref 17–63)
ANION GAP: 7 (ref 5–15)
AST: 34 U/L (ref 15–41)
Albumin: 3.3 g/dL — ABNORMAL LOW (ref 3.5–5.0)
BILIRUBIN TOTAL: 0.7 mg/dL (ref 0.3–1.2)
BUN: 17 mg/dL (ref 6–20)
CALCIUM: 9 mg/dL (ref 8.9–10.3)
CO2: 26 mmol/L (ref 22–32)
CREATININE: 1.36 mg/dL — AB (ref 0.61–1.24)
Chloride: 108 mmol/L (ref 101–111)
GFR calc Af Amer: 58 mL/min — ABNORMAL LOW (ref >60)
GFR calc non Af Amer: 50 mL/min — ABNORMAL LOW (ref >60)
Glucose, Bld: 92 mg/dL (ref 65–99)
Potassium: 4.2 mmol/L (ref 3.5–5.1)
Sodium: 141 mmol/L (ref 135–145)
TOTAL PROTEIN: 6.3 g/dL — AB (ref 6.5–8.1)

## 2016-10-11 LAB — CBC
HEMATOCRIT: 34.6 % — AB (ref 39.0–52.0)
HEMOGLOBIN: 11.5 g/dL — AB (ref 13.0–17.0)
MCH: 33.4 pg (ref 26.0–34.0)
MCHC: 33.2 g/dL (ref 30.0–36.0)
MCV: 100.6 fL — AB (ref 78.0–100.0)
Platelets: 122 10*3/uL — ABNORMAL LOW (ref 150–400)
RBC: 3.44 MIL/uL — ABNORMAL LOW (ref 4.22–5.81)
RDW: 13.4 % (ref 11.5–15.5)
WBC: 5.3 10*3/uL (ref 4.0–10.5)

## 2016-10-11 LAB — APTT: aPTT: 34 seconds (ref 24–36)

## 2016-10-11 LAB — TYPE AND SCREEN
ABO/RH(D): AB POS
Antibody Screen: NEGATIVE

## 2016-10-11 LAB — PROTIME-INR
INR: 1.25
Prothrombin Time: 15.8 seconds — ABNORMAL HIGH (ref 11.4–15.2)

## 2016-10-11 SURGERY — RADIOLOGY WITH ANESTHESIA
Anesthesia: General

## 2016-10-11 MED ORDER — PROPOFOL 10 MG/ML IV BOLUS
INTRAVENOUS | Status: DC | PRN
  Administered 2016-10-11: 10 mg via INTRAVENOUS
  Administered 2016-10-11: 160 mg via INTRAVENOUS
  Administered 2016-10-11: 20 mg via INTRAVENOUS

## 2016-10-11 MED ORDER — FERROUS SULFATE 325 (65 FE) MG PO TABS
325.0000 mg | ORAL_TABLET | Freq: Every day | ORAL | Status: DC
  Administered 2016-10-12: 325 mg via ORAL
  Filled 2016-10-11: qty 1

## 2016-10-11 MED ORDER — PHENYLEPHRINE HCL 10 MG/ML IJ SOLN
INTRAVENOUS | Status: DC | PRN
  Administered 2016-10-11: 15 ug/min via INTRAVENOUS

## 2016-10-11 MED ORDER — ROCURONIUM BROMIDE 10 MG/ML (PF) SYRINGE
PREFILLED_SYRINGE | INTRAVENOUS | Status: DC | PRN
  Administered 2016-10-11: 50 mg via INTRAVENOUS

## 2016-10-11 MED ORDER — EPHEDRINE SULFATE-NACL 50-0.9 MG/10ML-% IV SOSY
PREFILLED_SYRINGE | INTRAVENOUS | Status: DC | PRN
  Administered 2016-10-11: 5 mg via INTRAVENOUS
  Administered 2016-10-11 (×2): 2.5 mg via INTRAVENOUS
  Administered 2016-10-11: 5 mg via INTRAVENOUS

## 2016-10-11 MED ORDER — HYDROCODONE-ACETAMINOPHEN 5-325 MG PO TABS
1.0000 | ORAL_TABLET | ORAL | Status: DC | PRN
Start: 2016-10-11 — End: 2016-10-12
  Administered 2016-10-11: 2 via ORAL
  Administered 2016-10-12: 1 via ORAL
  Filled 2016-10-11: qty 1
  Filled 2016-10-11: qty 2

## 2016-10-11 MED ORDER — CEFAZOLIN SODIUM-DEXTROSE 2-4 GM/100ML-% IV SOLN
2.0000 g | INTRAVENOUS | Status: AC
  Administered 2016-10-11: 2 g via INTRAVENOUS
  Filled 2016-10-11: qty 100

## 2016-10-11 MED ORDER — LIDOCAINE 2% (20 MG/ML) 5 ML SYRINGE
INTRAMUSCULAR | Status: DC | PRN
  Administered 2016-10-11: 100 mg via INTRAVENOUS

## 2016-10-11 MED ORDER — ENSURE COMPLETE SHAKE PO LIQD
1.0000 | Freq: Every day | ORAL | Status: DC
  Administered 2016-10-11: 1 via ORAL
  Filled 2016-10-11 (×2): qty 237

## 2016-10-11 MED ORDER — ALBUTEROL SULFATE (2.5 MG/3ML) 0.083% IN NEBU: 3.0000 mL | INHALATION_SOLUTION | RESPIRATORY_TRACT | Status: DC | PRN

## 2016-10-11 MED ORDER — LACTATED RINGERS IV SOLN
INTRAVENOUS | Status: DC | PRN
  Administered 2016-10-11 (×2): via INTRAVENOUS

## 2016-10-11 MED ORDER — IOPAMIDOL (ISOVUE-300) INJECTION 61%
INTRAVENOUS | Status: AC
  Administered 2016-10-11: 50 mL
  Filled 2016-10-11: qty 100

## 2016-10-11 MED ORDER — METOCLOPRAMIDE HCL 5 MG/ML IJ SOLN: 10.0000 mg | Freq: Once | INTRAMUSCULAR | Status: DC | PRN

## 2016-10-11 MED ORDER — FENTANYL CITRATE (PF) 100 MCG/2ML IJ SOLN: 25.0000 ug | INTRAMUSCULAR | Status: DC | PRN

## 2016-10-11 MED ORDER — MEPERIDINE HCL 25 MG/ML IJ SOLN: 6.2500 mg | INTRAMUSCULAR | Status: DC | PRN

## 2016-10-11 MED ORDER — ONDANSETRON HCL 4 MG/2ML IJ SOLN
INTRAMUSCULAR | Status: DC | PRN
  Administered 2016-10-11: 4 mg via INTRAVENOUS

## 2016-10-11 MED ORDER — ALLOPURINOL 300 MG PO TABS
300.0000 mg | ORAL_TABLET | Freq: Every day | ORAL | Status: DC
  Administered 2016-10-11 – 2016-10-12 (×2): 300 mg via ORAL
  Filled 2016-10-11 (×2): qty 1

## 2016-10-11 MED ORDER — FENTANYL CITRATE (PF) 100 MCG/2ML IJ SOLN
INTRAMUSCULAR | Status: DC | PRN
  Administered 2016-10-11: 50 ug via INTRAVENOUS
  Administered 2016-10-11: 25 ug via INTRAVENOUS
  Administered 2016-10-11: 50 ug via INTRAVENOUS
  Administered 2016-10-11: 25 ug via INTRAVENOUS

## 2016-10-11 MED ORDER — MONTELUKAST SODIUM 10 MG PO TABS
10.0000 mg | ORAL_TABLET | Freq: Every day | ORAL | Status: DC
  Administered 2016-10-11: 10 mg via ORAL
  Filled 2016-10-11: qty 1

## 2016-10-11 MED ORDER — MEPERIDINE HCL 50 MG/ML IJ SOLN
INTRAMUSCULAR | Status: AC
  Filled 2016-10-11: qty 1

## 2016-10-11 MED ORDER — MOMETASONE FURO-FORMOTEROL FUM 200-5 MCG/ACT IN AERO
2.0000 | INHALATION_SPRAY | Freq: Two times a day (BID) | RESPIRATORY_TRACT | Status: DC
  Administered 2016-10-11 – 2016-10-12 (×2): 2 via RESPIRATORY_TRACT
  Filled 2016-10-11: qty 8.8

## 2016-10-11 MED ORDER — IOPAMIDOL (ISOVUE-300) INJECTION 61%
INTRAVENOUS | Status: AC
  Administered 2016-10-11: 40 mL
  Filled 2016-10-11: qty 150

## 2016-10-11 MED ORDER — SODIUM CHLORIDE 0.9 % IV SOLN: INTRAVENOUS | Status: DC

## 2016-10-11 MED ORDER — SUGAMMADEX SODIUM 200 MG/2ML IV SOLN
INTRAVENOUS | Status: DC | PRN
  Administered 2016-10-11: 100 mg via INTRAVENOUS

## 2016-10-11 MED ORDER — LACTATED RINGERS IV SOLN
INTRAVENOUS | Status: DC | PRN
  Administered 2016-10-11: 07:00:00 via INTRAVENOUS

## 2016-10-11 NOTE — Progress Notes (Signed)
CRNA, [place], present for case

## 2016-10-11 NOTE — Progress Notes (Signed)
Patient resting comfortably this afternoon.  He is alert and oriented. States his abdomen is sore, but denies nausea, vomiting, or pain.  He was able to tolerate some lunch today and has Ensure at bedside.  Percutaneous site clean and dry.  Neck site clean and dry.  Patient aware of plans for d/c in the morning if doing well.   Brynda Greathouse, MMS RDN PA-C 4:11 PM

## 2016-10-11 NOTE — H&P (Signed)
Chief Complaint: Patient was seen in consultation today for cirrhosis  Referring Physician(s):  Dr. Arta Silence  Supervising Physician: Arne Cleveland  Patient Status: [facility] - Out-pt  History of Present Illness: Donald Moreno is a 76 y.o. male with past medical history of HTN, arthritis, a fib, asthma, and liver cirrhosis known to our service from multiple visits for ultrasound-guided paracentesis.  Patient met with Dr. Vernard Gambles in the [place] clinic 09/29/16 to discuss management options of his large volume, recurrent ascites.  Patient has decided to pursue TIPS.  He present for procedure today.  He has been NPO.  Past Medical History:  Diagnosis Date  . Arthritis   . Asthma   . ETOH abuse   . Gout   . Hypertension   . Liver cirrhosis ([place]) 2015  . Spontaneous bacterial peritonitis ([place]) 07/01/2015    Past Surgical History:  Procedure Laterality Date  . IR PARACENTESIS  08/19/2016  . IR PARACENTESIS  08/26/2016  . IR PARACENTESIS  09/02/2016  . IR PARACENTESIS  09/09/2016  . IR PARACENTESIS  09/16/2016  . IR PARACENTESIS  10/03/2016    Allergies: Patient has no known allergies.  Medications: Prior to Admission medications   Medication Sig Start Date End Date Taking? Authorizing Provider  ADVAIR DISKUS 500-50 MCG/DOSE AEPB INHALE 1 PUFF TWO TIMES  DAILY 09/16/16   Katheren Shams, DO  allopurinol (ZYLOPRIM) 300 MG tablet Take 1 tablet (300 mg total) by mouth daily. 09/16/16   Katheren Shams, DO  Cholecalciferol (VITAMIN D-3) 5000 units TABS Take 1 tablet by mouth daily.    [provider]  ferrous sulfate 325 (65 FE) MG tablet Take 1 tablet (325 mg total) by mouth 2 (two) times daily with a meal. Patient taking differently: Take 325 mg by mouth daily with breakfast.  10/21/14   Ronnie Doss M, DO  montelukast (SINGULAIR) 10 MG tablet Take 1 tablet (10 mg total) by mouth at bedtime. 09/23/15   Katheren Shams, DO  Nutritional Supplements (ENSURE COMPLETE SHAKE) LIQD  Take 1 Container by mouth daily. 03/04/15   Katheren Shams, DO  PROAIR HFA 108 [phone] Base) MCG/ACT inhaler Use 2 puffs every 4 hours  as needed for wheezing or  shortness of breath 11/26/15   Veatrice Bourbon, MD     Family History  Problem Relation Age of Onset  . Other Mother        died when pt was only 51 - unknown cause.  . Other Father        deceased.  . Asthma Sister   . Heart disease Sister        multiple stents.  . Asthma Brother   . Hypertension Brother   . Asthma Son   . Asthma Brother   . Hypertension Brother     Social History   Social History  . Marital status: Divorced    Spouse name: N/A  . Number of children: 5  . Years of education: 10   Occupational History  . Retired- Administrator    Social History Main Topics  . Smoking status: Never Smoker  . Smokeless tobacco: Current User    Types: Snuff  . Alcohol use No     Comment: quit drinking about 2 years ago  . Drug use: No  . Sexual activity: Not Currently   Other Topics Concern  . Not on file   Social History Narrative   Lives with nephew and niece in [place] home.  Tobacco: snuff all day (can last two days. Never smoked.       Hobbies: cooking, sleeping, going to store   Pets:  Dog, Bean    Review of Systems  Constitutional: Negative for fatigue and fever.  Respiratory: Negative for cough and shortness of breath.   Cardiovascular: Negative for chest pain.  Psychiatric/Behavioral: Negative for behavioral problems and confusion.    Vital Signs: There were no vitals taken for this visit.  Physical Exam  Constitutional: He is oriented to person, place, and time. He appears well-developed.  Cardiovascular: Normal rate, regular rhythm and normal heart sounds.   Pulmonary/Chest: Effort normal. No respiratory distress. He has wheezes (faint, bilateral).  Abdominal: Soft.  Neurological: He is alert and oriented to person, place, and time.  Skin: Skin is warm and dry.  Psychiatric: He has  a normal mood and affect. His behavior is normal. Judgment and thought content normal.  Nursing note and vitals reviewed.   Mallampati Score:  MD Evaluation Airway: WNL Heart: WNL Abdomen: WNL Chest/ Lungs: WNL ASA  Classification: 3 Mallampati/Airway Score: Two  Imaging: [place] Art/ven Flow Abd Pelv Doppler  Result Date: 10/03/2016 CLINICAL DATA:  Ascites. EXAM: DUPLEX ULTRASOUND OF LIVER TECHNIQUE: Color and duplex Doppler ultrasound was performed to evaluate the hepatic in-flow and out-flow vessels. COMPARISON:  None. FINDINGS: Portal Vein Velocities Main:  26.3 cm/sec Right:  39.9 cm/sec Left:  33.1 cm/sec Normal hepatopetal flow is noted in the portal veins. Hepatic Vein Velocities Right:  42.2 cm/sec Middle:  36.4 cm/sec Left:  68.2 cm/sec Normal hepatofugal flow is noted in hepatic veins. Hepatic Artery Velocity:  75.6 cm/sec Splenic Vein Velocity:  49.3 cm/sec Varices: Absent. Ascites: Present. Spleen is normal in size and appearance.  Gallstones are noted. IMPRESSION: Ascites is noted. There is no evidence of hepatic, portal or splenic venous thrombosis or occlusion. Electronically Signed   By: Marijo Conception, M.D.   On: 10/03/2016 13:14   Ir Paracentesis  Result Date: 10/03/2016 INDICATION: Patient with history of cirrhosis and recurrent ascites. Request is made for therapeutic paracentesis. Patient is to receive albumin post-procedure. EXAM: ULTRASOUND GUIDED THERAPEUTIC PARACENTESIS MEDICATIONS: 10 mL 1% lidocaine COMPLICATIONS: None immediate. PROCEDURE: Informed written consent was obtained from the patient after a discussion of the risks, benefits and alternatives to treatment. A timeout was performed prior to the initiation of the procedure. Initial ultrasound scanning demonstrates a large amount of ascites within the right lateral abdomen. The right lateral abdomen was prepped and draped in the usual sterile fashion. 1% lidocaine was used for local anesthesia. Following this, a 19  gauge, 7-cm, Yueh catheter was introduced. An ultrasound image was saved for documentation purposes. The paracentesis was performed. The catheter was removed and a dressing was applied. The patient tolerated the procedure well without immediate post procedural complication. FINDINGS: A total of approximately 7.0 liters of clear, yellow fluid was removed. IMPRESSION: Successful ultrasound-guided therapeutic paracentesis yielding 7.0 liters of peritoneal fluid. Read by:  Brynda Greathouse PA-C Electronically Signed   By: Markus Daft M.D.   On: 10/03/2016 11:01   Ir Paracentesis  Result Date: 09/16/2016 INDICATION: Patient with a history of alcoholic cirrhosis and recurrent abdominal ascites. Request is made for therapeutic paracentesis. He is given a maximum of 6-7 L and is to receive albumin afterwards. EXAM: ULTRASOUND GUIDED THERAPEUTIC PARACENTESIS MEDICATIONS: 1% lidocaine COMPLICATIONS: None immediate. PROCEDURE: Informed written consent was obtained from the patient after a discussion of the risks, benefits and alternatives to treatment. A  timeout was performed prior to the initiation of the procedure. Initial ultrasound scanning demonstrates a moderate amount of ascites within the right lower abdominal quadrant. The right lower abdomen was prepped and draped in the usual sterile fashion. 1% lidocaine was used for local anesthesia. Following this, a 19 gauge, 7-cm, Yueh catheter was introduced. An ultrasound image was saved for documentation purposes. The paracentesis was performed. The catheter was removed and a dressing was applied. The patient tolerated the procedure well without immediate post procedural complication. FINDINGS: A total of approximately 4.7 L of yellow fluid was removed. IMPRESSION: Successful ultrasound-guided paracentesis yielding 4.7 liters of peritoneal fluid. Read by: Saverio Danker, PA-C Electronically Signed   By: Aletta Edouard M.D.   On: 09/16/2016 13:13    Labs:  CBC:  Recent  Labs  06/19/16 0247 06/20/16 0503 10/11/16 0702  WBC 6.9 5.4 5.3  HGB 13.1 11.5* 11.5*  HCT 38.5* 34.2* 34.6*  PLT 125* 123* 122*    COAGS:  Recent Labs  06/20/16 0503 10/11/16 0702  INR 1.25 1.25  APTT  --  34    BMP:  Recent Labs  06/19/16 0247 06/20/16 0503 09/29/16 1701 10/11/16 0702  NA 140 138 140 141  K 4.6 4.6 4.0 4.2  CL 108 110 107 108  CO2 '25 24 20 26  ' GLUCOSE 95 86 78 92  BUN 21* 22* 13 17  CALCIUM 9.0 8.5* 8.9 9.0  CREATININE 1.87* 1.56* 1.19* 1.36*  GFRNONAA 34* 42* 60 50*  GFRAA 39* 49* 69 58*    LIVER FUNCTION TESTS:  Recent Labs  06/19/16 0247 06/20/16 0503 09/29/16 1701 10/11/16 0702  BILITOT 0.8 1.0 1.1 0.7  AST '24 20 22 ' 34  ALT 13* 12* 9 14*  ALKPHOS 80 63 88 71  PROT 6.6 5.4* 6.4 6.3*  ALBUMIN 3.7 2.9* 3.7 3.3*    TUMOR MARKERS: No results for input(s): AFPTM, CEA, CA199, CHROMGRNA in the last 8760 hours.  Assessment and Plan: Nikitas Davtyan is a 76 year old man with a fib, asthma, and liver cirrhosis with recurrent large volume ascites.  Patient met with Dr. Vernard Gambles 09/29/16 to discuss management options of his recurrent ascites.  He has been under the care of Dr. Arta Silence for his cirrhosis.  Patient has been deemed to be an appropriate candidate and presents for procedure today.  He has been in his usual state of health.  He has been NPO.  Risks and Benefits discussed with the patient including, but not limited to bleeding, infection, vascular injury, possible need for by mouth lactulose to prevent hepatic encephalopathy, variable effect on hepatic dysfunction post TIPS, need for ultrasound surveillance post TIPS. All of the patient's questions were answered, patient is agreeable to proceed. Consent signed and in chart.   Thank you for this interesting consult.  I greatly enjoyed meeting Burel Kahre and look forward to participating in their care.  A copy of this report was sent to the requesting provider on this  date.  Electronically Signed: Docia Barrier, PA 10/11/2016, 8:04 AM   I spent a total of    15 Minutes in face to face in clinical consultation, greater than 50% of which was counseling/coordinating care for cirrhosis.

## 2016-10-11 NOTE — Anesthesia Postprocedure Evaluation (Signed)
Anesthesia Post Note  Patient: Donald Moreno  Procedure(s) Performed: Procedure(s) (LRB): TIPS (N/A)  Patient location during evaluation: PACU Anesthesia Type: General Level of consciousness: awake and alert Pain management: pain level controlled Vital Signs Assessment: post-procedure vital signs reviewed and stable Respiratory status: spontaneous breathing, nonlabored ventilation, respiratory function stable and patient connected to nasal cannula oxygen Cardiovascular status: blood pressure returned to baseline and stable Postop Assessment: no signs of nausea or vomiting Anesthetic complications: no       Last Vitals:  Vitals:   10/11/16 0658 10/11/16 1120  BP: (!) 159/83 (!) 141/74  Pulse: (!) 53 71  Resp: 20 15  Temp: 37 C 36.4 C    Last Pain:  Vitals:   10/11/16 0658  TempSrc: Oral                 Donald Moreno

## 2016-10-11 NOTE — Transfer of Care (Signed)
Immediate Anesthesia Transfer of Care Note  Patient: Donald Moreno  Procedure(s) Performed: Procedure(s): TIPS (N/A)  Patient Location: PACU  Anesthesia Type:General  Level of Consciousness: awake, alert  and oriented  Airway & Oxygen Therapy: Patient Spontanous Breathing  Post-op Assessment: Report given to RN and Post -op Vital signs reviewed and stable  Post vital signs: Reviewed and stable  Last Vitals:  Vitals:   10/11/16 0658  BP: (!) 159/83  Pulse: (!) 53  Resp: 20  Temp: 37 C    Last Pain:  Vitals:   10/11/16 0658  TempSrc: Oral      Patients Stated Pain Goal: 6 (73/56/70 1410)  Complications: No apparent anesthesia complications

## 2016-10-11 NOTE — Anesthesia Preprocedure Evaluation (Addendum)
Anesthesia Evaluation  Patient identified by MRN, date of birth, ID band Patient awake    Reviewed: Allergy & Precautions, NPO status , Patient's Chart, lab work & pertinent test results  Airway Mallampati: II  TM Distance: >3 FB Neck ROM: Full    Dental no notable dental hx. (+) Edentulous Upper, Edentulous Lower   Pulmonary asthma ,    Pulmonary exam normal breath sounds clear to auscultation       Cardiovascular hypertension, Pt. on medications Normal cardiovascular exam Rhythm:Regular Rate:Normal     Neuro/Psych negative neurological ROS  negative psych ROS   GI/Hepatic negative GI ROS, (+) Cirrhosis   ascites  substance abuse  alcohol use,   Endo/Other  negative endocrine ROS  Renal/GU negative Renal ROS  negative genitourinary   Musculoskeletal negative musculoskeletal ROS (+)   Abdominal (+)  Abdomen: soft.    Peds negative pediatric ROS (+)  Hematology negative hematology ROS (+)   Anesthesia Other Findings   Reproductive/Obstetrics negative OB ROS                             Anesthesia Physical Anesthesia Plan  ASA: IV  Anesthesia Plan: General   Post-op Pain Management:    Induction: Intravenous  Airway Management Planned: Oral ETT  Additional Equipment: Arterial line  Intra-op Plan:   Post-operative Plan: Extubation in OR  Informed Consent: I have reviewed the patients History and Physical, chart, labs and discussed the procedure including the risks, benefits and alternatives for the proposed anesthesia with the patient or authorized representative who has indicated his/her understanding and acceptance.   Dental advisory given  Plan Discussed with: CRNA  Anesthesia Plan Comments:        Anesthesia Quick Evaluation

## 2016-10-11 NOTE — Anesthesia Procedure Notes (Signed)
Procedure Name: Intubation Date/Time: 10/11/2016 8:49 AM Performed by: Merdis Delay Pre-anesthesia Checklist: Patient identified, Emergency Drugs available, Suction available, Patient being monitored and Timeout performed Patient Re-evaluated:Patient Re-evaluated prior to inductionOxygen Delivery Method: Circle system utilized Preoxygenation: Pre-oxygenation with 100% oxygen Intubation Type: IV induction Ventilation: Mask ventilation without difficulty and Oral airway inserted - appropriate to patient size Laryngoscope Size: Mac and 4 Grade View: Grade I Tube type: Oral Tube size: 7.5 mm Number of attempts: 1 Airway Equipment and Method: Stylet Placement Confirmation: ETT inserted through vocal cords under direct vision,  positive ETCO2 and breath sounds checked- equal and bilateral Secured at: 22 cm Tube secured with: Tape Dental Injury: Teeth and Oropharynx as per pre-operative assessment

## 2016-10-11 NOTE — Procedures (Signed)
1. Paracentesis w/ [place] 3.1 L 2. TIPS w/ US/fluoro  No complication No blood loss. See complete dictation in [facility].

## 2016-10-12 ENCOUNTER — Encounter (HOSPITAL_COMMUNITY): Payer: Self-pay | Admitting: Interventional Radiology

## 2016-10-12 DIAGNOSIS — K7031 Alcoholic cirrhosis of liver with ascites: Secondary | ICD-10-CM | POA: Diagnosis not present

## 2016-10-12 DIAGNOSIS — R188 Other ascites: Secondary | ICD-10-CM | POA: Diagnosis not present

## 2016-10-12 DIAGNOSIS — K7460 Unspecified cirrhosis of liver: Secondary | ICD-10-CM | POA: Diagnosis not present

## 2016-10-12 MED ORDER — ENSURE ENLIVE PO LIQD
237.0000 mL | Freq: Every day | ORAL | Status: DC
  Administered 2016-10-12: 237 mL via ORAL

## 2016-10-12 NOTE — Care Management Obs Status (Signed)
[facility] NOTIFICATION   Patient Details  Name: Quinterrius Errington MRN: [id number] Date of Birth: 11-24-1940   Medicare Observation Status Notification Given:  Yes    Marilu Favre, RN 10/12/2016, 10:54 AM

## 2016-10-12 NOTE — Progress Notes (Signed)
Patient ID: Donald Moreno, male   DOB: 09/07/1940, 76 y.o.   MRN: [id number]   RN calls to report that although pt has urinated 2 x since foley removal It is small amounts - maybe 50 cc each time. Urine is yellow but dark per RN Bladder scan shows 200 cc in bladder  Discussed with Dr Kathlene Cote Pt HAS urinated 2x Pt states little output of urine is normal for him at home.  Will go ahead with DC to home I will call pt tomorrow to check on his output.

## 2016-10-12 NOTE — Progress Notes (Signed)
Patient ID: Donald Moreno, male   DOB: 05-17-1940, 76 y.o.   MRN: [id number]   I have called Dr Paulita Fujita office They will call pt with time and date of follow up post TIPS procedure

## 2016-10-12 NOTE — Progress Notes (Signed)
Discharge instructions reviewed with pt.  Pt verbalized understanding and had no questions.  Pt discharged in stable condition via wheelchair.  Norris Bodley Lindsay   

## 2016-10-12 NOTE — Discharge Summary (Signed)
Patient ID: Donald Moreno MRN: [id number] DOB/AGE: 74-Sep-1942 74 y.o.  Admit date: 10/11/2016 Discharge date: 10/12/2016  Supervising Physician: Arne Cleveland  Patient Status: [facility] - In-pt  Admission Diagnoses: Cirrhosis                                         Recurrent ascites  Discharge Diagnoses:  Active Problems:   Cirrhosis [facility])   Discharged Condition: Improved  Hospital Course: Recurrent ascites; secondary cirrhosis Pt was consulted with Dr Vernard Gambles regarding Transjugular intrahepatic portal system shunt procedure and was performed 5/29 in IR. Tolerated well. Admitted 24 hr observation. Slept well; eating well. Denies N/V; denies pain UOP great; +BM. Mentation is stable- no changes. Sites of procedure are all clean and dry and without pain or hematoma. For discharge this morning after breakfast and orthostatic vital signs. I have seen and examined pt-discussed pt status with Dr Kathlene Cote Plan for discharge to home today.  Consults: None  Significant Diagnostic Studies: 1. Paracentesis w/ [place] 3.1 L                                                        2. TIPS w/ US/fluoro  Treatments: IMPRESSION: 1. Technically successful TIPS creation with decrease in mean portosystemic pressure from 14 mmHg to 8 mmHg. 2. Ultrasound guided paracentesis removing 3.1 L clear yellow ascites.  Discharge Exam: Blood pressure (!) 118/56, pulse (!) 54, temperature 98 F (36.7 C), temperature source Oral, resp. rate 18, height 5' 10.5" (1.791 m), weight 146 lb 3.2 oz (66.3 kg), SpO2 100 %.  A/O Appropriate; pleasant Rested Heart RRR Lungs CTA Abd soft; + BS NT; no masses Skin sites of procedure paracentesis and access are clean and dry NT no bleeding; no hematoma Extr FROM; ambulating in room Rt neck site clean and dry; NT No bleeding no hematoma UOP good; yellow Passing gas; + BM  Results for orders placed or performed during the hospital encounter of 10/11/16  APTT    Result Value Ref Range   aPTT 34 24 - 36 seconds  CBC  Result Value Ref Range   WBC 5.3 4.0 - 10.5 K/uL   RBC 3.44 (L) 4.22 - 5.81 MIL/uL   Hemoglobin 11.5 (L) 13.0 - 17.0 g/dL   HCT 34.6 (L) 39.0 - 52.0 %   MCV 100.6 (H) 78.0 - 100.0 fL   MCH 33.4 26.0 - 34.0 pg   MCHC 33.2 30.0 - 36.0 g/dL   RDW 13.4 11.5 - 15.5 %   Platelets 122 (L) 150 - 400 K/uL  Comprehensive metabolic panel  Result Value Ref Range   Sodium 141 135 - 145 mmol/L   Potassium 4.2 3.5 - 5.1 mmol/L   Chloride 108 101 - 111 mmol/L   CO2 26 22 - 32 mmol/L   Glucose, Bld 92 65 - 99 mg/dL   BUN 17 6 - 20 mg/dL   Creatinine, Ser 1.36 (H) 0.61 - 1.24 mg/dL   Calcium 9.0 8.9 - 10.3 mg/dL   Total Protein 6.3 (L) 6.5 - 8.1 g/dL   Albumin 3.3 (L) 3.5 - 5.0 g/dL   AST 34 15 - 41 U/L   ALT 14 (L) 17 - 63 U/L   Alkaline  Phosphatase 71 38 - 126 U/L   Total Bilirubin 0.7 0.3 - 1.2 mg/dL   GFR calc non Af Amer 50 (L) >60 mL/min   GFR calc Af Amer 58 (L) >60 mL/min   Anion gap 7 5 - 15  Protime-INR  Result Value Ref Range   Prothrombin Time 15.8 (H) 11.4 - 15.2 seconds   INR 1.25   Type and screen  Result Value Ref Range   ABO/RH(D) AB POS    Antibody Screen NEG    Sample Expiration 10/14/2016      Disposition: TIPS procedure performed in IR 5/29 Tolerated well Overnight stay was without event Plan for DC home today. Continue all home meds Follow up with Dr Paulita Fujita  Follow up with Dr Hipolito Bayley will hear from scheduler for time and date.  Discharge Instructions    Call MD for:  difficulty breathing, headache or visual disturbances    Complete by:  As directed    Call MD for:  hives    Complete by:  As directed    Call MD for:  persistant dizziness or light-headedness    Complete by:  As directed    Call MD for:  persistant nausea and vomiting    Complete by:  As directed    Call MD for:  redness, tenderness, or signs of infection (pain, swelling, redness, odor or green/yellow discharge around incision  site)    Complete by:  As directed    Call MD for:  severe uncontrolled pain    Complete by:  As directed    Call MD for:  temperature >100.4    Complete by:  As directed    Diet - low sodium heart healthy    Complete by:  As directed    Discharge instructions    Complete by:  As directed    Follow up with Dr Paulita Fujita in 3-4 weeks; follow up with Dr Vernard Gambles 2-4 weeks at [facility]- pt will hear from scheduler for time and date; resume all meds   Discharge wound care:    Complete by:  As directed    Keep band aid on Rt neck site daily for 1 week   Driving Restrictions    Complete by:  As directed    No driving 1 week   Increase activity slowly    Complete by:  As directed    Lifting restrictions    Complete by:  As directed    no lifting over 10 lbs x 1 week     Allergies as of 10/12/2016   No Known Allergies     Medication List    TAKE these medications   ADVAIR DISKUS 500-50 MCG/DOSE Aepb Generic drug:  Fluticasone-Salmeterol INHALE 1 PUFF TWO TIMES  DAILY   allopurinol 300 MG tablet Commonly known as:  ZYLOPRIM Take 1 tablet (300 mg total) by mouth daily.   ENSURE COMPLETE SHAKE Liqd Take 1 Container by mouth daily.   ferrous sulfate 325 (65 FE) MG tablet Take 1 tablet (325 mg total) by mouth 2 (two) times daily with a meal. What changed:  when to take this   montelukast 10 MG tablet Commonly known as:  SINGULAIR Take 1 tablet (10 mg total) by mouth at bedtime.   PROAIR HFA 108 (90 Base) MCG/ACT inhaler Generic drug:  albuterol Use 2 puffs every 4 hours  as needed for wheezing or  shortness of breath   Vitamin D-3 5000 units Tabs Take 1 tablet by mouth daily.  Follow-up Information    Arne Cleveland, MD Follow up in 1 month(s).   Specialty:  Interventional Radiology Why:  follow up with Dr Vernard Gambles in 2-4 weeks- pt will hear from scheduler with time and date; call [phone] if questions or concerns Contact information: [address] [place] [address] [phone]            Electronically Signed: Monia Sabal A, PA-C 10/12/2016, 8:53 AM   I have spent Greater Than 30 Minutes discharging Donald Moreno.

## 2016-10-13 ENCOUNTER — Telehealth: Payer: Self-pay | Admitting: Radiology

## 2016-10-13 ENCOUNTER — Ambulatory Visit: Payer: Medicare Other | Admitting: Cardiovascular Disease

## 2016-10-13 NOTE — Progress Notes (Signed)
  Pt was discharged to home yesterday afternoon Post TIPS procedure 5/29  Had urinated 2 x after foley removal but amount was slight.  I have called pt this am He says he is urinating great now at home Slept well; has had BM Eating well  To call us with any issue

## 2016-10-16 ENCOUNTER — Encounter (HOSPITAL_COMMUNITY): Admission: EM | Disposition: A | Discharge status: Home Health | Payer: Self-pay | Source: Home / Self Care

## 2016-10-16 ENCOUNTER — Emergency Department (HOSPITAL_COMMUNITY): Payer: Medicare Other | Admitting: Anesthesiology

## 2016-10-16 ENCOUNTER — Inpatient Hospital Stay (HOSPITAL_COMMUNITY)
Admission: EM | Admit: 2016-10-16 | Discharge: 2016-10-23 | DRG: 353 | Disposition: A | Discharge status: Home Health | Payer: Medicare Other | Attending: General Surgery | Admitting: General Surgery

## 2016-10-16 ENCOUNTER — Encounter (HOSPITAL_COMMUNITY): Payer: Self-pay

## 2016-10-16 DIAGNOSIS — I129 Hypertensive chronic kidney disease with stage 1 through stage 4 chronic kidney disease, or unspecified chronic kidney disease: Secondary | ICD-10-CM | POA: Diagnosis present

## 2016-10-16 DIAGNOSIS — J45909 Unspecified asthma, uncomplicated: Secondary | ICD-10-CM | POA: Diagnosis not present

## 2016-10-16 DIAGNOSIS — R188 Other ascites: Secondary | ICD-10-CM

## 2016-10-16 DIAGNOSIS — M109 Gout, unspecified: Secondary | ICD-10-CM | POA: Diagnosis not present

## 2016-10-16 DIAGNOSIS — F1010 Alcohol abuse, uncomplicated: Secondary | ICD-10-CM | POA: Diagnosis present

## 2016-10-16 DIAGNOSIS — K7030 Alcoholic cirrhosis of liver without ascites: Secondary | ICD-10-CM | POA: Diagnosis present

## 2016-10-16 DIAGNOSIS — K420 Umbilical hernia with obstruction, without gangrene: Secondary | ICD-10-CM | POA: Diagnosis not present

## 2016-10-16 DIAGNOSIS — I10 Essential (primary) hypertension: Secondary | ICD-10-CM

## 2016-10-16 DIAGNOSIS — D689 Coagulation defect, unspecified: Secondary | ICD-10-CM | POA: Diagnosis present

## 2016-10-16 DIAGNOSIS — R140 Abdominal distension (gaseous): Secondary | ICD-10-CM

## 2016-10-16 DIAGNOSIS — M545 Low back pain: Secondary | ICD-10-CM | POA: Diagnosis not present

## 2016-10-16 DIAGNOSIS — R34 Anuria and oliguria: Secondary | ICD-10-CM | POA: Diagnosis not present

## 2016-10-16 DIAGNOSIS — K7460 Unspecified cirrhosis of liver: Secondary | ICD-10-CM | POA: Diagnosis not present

## 2016-10-16 DIAGNOSIS — I483 Typical atrial flutter: Secondary | ICD-10-CM | POA: Diagnosis present

## 2016-10-16 DIAGNOSIS — M1990 Unspecified osteoarthritis, unspecified site: Secondary | ICD-10-CM | POA: Diagnosis present

## 2016-10-16 DIAGNOSIS — J189 Pneumonia, unspecified organism: Secondary | ICD-10-CM | POA: Diagnosis not present

## 2016-10-16 DIAGNOSIS — N179 Acute kidney failure, unspecified: Secondary | ICD-10-CM | POA: Diagnosis not present

## 2016-10-16 DIAGNOSIS — K7031 Alcoholic cirrhosis of liver with ascites: Secondary | ICD-10-CM

## 2016-10-16 DIAGNOSIS — K567 Ileus, unspecified: Secondary | ICD-10-CM | POA: Diagnosis not present

## 2016-10-16 DIAGNOSIS — R109 Unspecified abdominal pain: Secondary | ICD-10-CM | POA: Diagnosis not present

## 2016-10-16 DIAGNOSIS — E43 Unspecified severe protein-calorie malnutrition: Secondary | ICD-10-CM

## 2016-10-16 DIAGNOSIS — N183 Chronic kidney disease, stage 3 (moderate): Secondary | ICD-10-CM | POA: Diagnosis not present

## 2016-10-16 DIAGNOSIS — Z720 Tobacco use: Secondary | ICD-10-CM

## 2016-10-16 DIAGNOSIS — I4892 Unspecified atrial flutter: Secondary | ICD-10-CM | POA: Diagnosis not present

## 2016-10-16 DIAGNOSIS — D696 Thrombocytopenia, unspecified: Secondary | ICD-10-CM | POA: Diagnosis not present

## 2016-10-16 DIAGNOSIS — I4891 Unspecified atrial fibrillation: Secondary | ICD-10-CM | POA: Diagnosis not present

## 2016-10-16 DIAGNOSIS — K429 Umbilical hernia without obstruction or gangrene: Secondary | ICD-10-CM | POA: Diagnosis not present

## 2016-10-16 DIAGNOSIS — Z6821 Body mass index (BMI) 21.0-21.9, adult: Secondary | ICD-10-CM

## 2016-10-16 DIAGNOSIS — G8929 Other chronic pain: Secondary | ICD-10-CM | POA: Diagnosis present

## 2016-10-16 DIAGNOSIS — Z978 Presence of other specified devices: Secondary | ICD-10-CM

## 2016-10-16 HISTORY — PX: UMBILICAL HERNIA REPAIR: SHX196

## 2016-10-16 HISTORY — PX: INSERTION OF MESH: SHX5868

## 2016-10-16 LAB — BASIC METABOLIC PANEL
Anion gap: 10 (ref 5–15)
BUN: 16 mg/dL (ref 6–20)
CO2: 22 mmol/L (ref 22–32)
Calcium: 8.9 mg/dL (ref 8.9–10.3)
Chloride: 110 mmol/L (ref 101–111)
Creatinine, Ser: 1.11 mg/dL (ref 0.61–1.24)
GFR calc Af Amer: >60 mL/min (ref >60)
GFR calc non Af Amer: >60 mL/min (ref >60)
Glucose, Bld: 116 mg/dL — ABNORMAL HIGH (ref 65–99)
Potassium: 4.2 mmol/L (ref 3.5–5.1)
Sodium: 142 mmol/L (ref 135–145)

## 2016-10-16 LAB — CBC WITH DIFFERENTIAL/PLATELET
Basophils Absolute: 0 10*3/uL (ref 0.0–0.1)
Basophils Relative: 1 %
Eosinophils Absolute: 0.2 10*3/uL (ref 0.0–0.7)
Eosinophils Relative: 4 %
HCT: 36.4 % — ABNORMAL LOW (ref 39.0–52.0)
Hemoglobin: 12.4 g/dL — ABNORMAL LOW (ref 13.0–17.0)
Lymphocytes Relative: 23 %
Lymphs Abs: 1.3 10*3/uL (ref 0.7–4.0)
MCH: 33.7 pg (ref 26.0–34.0)
MCHC: 34.1 g/dL (ref 30.0–36.0)
MCV: 98.9 fL (ref 78.0–100.0)
Monocytes Absolute: 0.4 10*3/uL (ref 0.1–1.0)
Monocytes Relative: 7 %
Neutro Abs: 3.7 10*3/uL (ref 1.7–7.7)
Neutrophils Relative %: 65 %
Platelets: 124 10*3/uL — ABNORMAL LOW (ref 150–400)
RBC: 3.68 MIL/uL — ABNORMAL LOW (ref 4.22–5.81)
RDW: 13.6 % (ref 11.5–15.5)
WBC: 5.7 10*3/uL (ref 4.0–10.5)

## 2016-10-16 SURGERY — REPAIR, HERNIA, UMBILICAL, ADULT
Anesthesia: General | Site: Abdomen

## 2016-10-16 MED ORDER — MORPHINE SULFATE (PF) 4 MG/ML IV SOLN
2.0000 mg | INTRAVENOUS | Status: DC | PRN
  Administered 2016-10-16: 2 mg via INTRAVENOUS
  Filled 2016-10-16: qty 1

## 2016-10-16 MED ORDER — ROCURONIUM BROMIDE 10 MG/ML (PF) SYRINGE
PREFILLED_SYRINGE | INTRAVENOUS | Status: AC
  Filled 2016-10-16: qty 5

## 2016-10-16 MED ORDER — MOMETASONE FURO-FORMOTEROL FUM 200-5 MCG/ACT IN AERO
2.0000 | INHALATION_SPRAY | Freq: Two times a day (BID) | RESPIRATORY_TRACT | Status: DC
  Administered 2016-10-17 – 2016-10-22 (×11): 2 via RESPIRATORY_TRACT
  Filled 2016-10-16 (×3): qty 8.8

## 2016-10-16 MED ORDER — PROPOFOL 10 MG/ML IV BOLUS
INTRAVENOUS | Status: AC
  Filled 2016-10-16: qty 20

## 2016-10-16 MED ORDER — FENTANYL CITRATE (PF) 250 MCG/5ML IJ SOLN
INTRAMUSCULAR | Status: AC
  Filled 2016-10-16: qty 5

## 2016-10-16 MED ORDER — CEFAZOLIN SODIUM-DEXTROSE 2-3 GM-% IV SOLR
INTRAVENOUS | Status: DC | PRN
  Administered 2016-10-16: 2 g via INTRAVENOUS

## 2016-10-16 MED ORDER — LIDOCAINE HCL (CARDIAC) 20 MG/ML IV SOLN
INTRAVENOUS | Status: DC | PRN
  Administered 2016-10-16: 60 mg via INTRATRACHEAL

## 2016-10-16 MED ORDER — MORPHINE SULFATE (PF) 4 MG/ML IV SOLN
4.0000 mg | Freq: Once | INTRAVENOUS | Status: AC
  Administered 2016-10-16: 4 mg via INTRAVENOUS
  Filled 2016-10-16: qty 1

## 2016-10-16 MED ORDER — 0.9 % SODIUM CHLORIDE (POUR BTL) OPTIME
TOPICAL | Status: DC | PRN
  Administered 2016-10-16: 1000 mL

## 2016-10-16 MED ORDER — FENTANYL CITRATE (PF) 100 MCG/2ML IJ SOLN: 25.0000 ug | INTRAMUSCULAR | Status: DC | PRN

## 2016-10-16 MED ORDER — LACTATED RINGERS IV SOLN
INTRAVENOUS | Status: DC | PRN
  Administered 2016-10-16: 15:00:00 via INTRAVENOUS

## 2016-10-16 MED ORDER — BUPIVACAINE-EPINEPHRINE (PF) 0.25% -1:200000 IJ SOLN
INTRAMUSCULAR | Status: AC
  Filled 2016-10-16: qty 30

## 2016-10-16 MED ORDER — ALBUTEROL SULFATE (2.5 MG/3ML) 0.083% IN NEBU: 3.0000 mL | INHALATION_SOLUTION | RESPIRATORY_TRACT | Status: DC | PRN

## 2016-10-16 MED ORDER — SUGAMMADEX SODIUM 200 MG/2ML IV SOLN
INTRAVENOUS | Status: DC | PRN
  Administered 2016-10-16: 200 mg via INTRAVENOUS

## 2016-10-16 MED ORDER — ONDANSETRON 4 MG PO TBDP: 4.0000 mg | ORAL_TABLET | Freq: Four times a day (QID) | ORAL | Status: DC | PRN

## 2016-10-16 MED ORDER — ENSURE COMPLETE SHAKE PO LIQD: 1.0000 | Freq: Every day | ORAL | Status: DC

## 2016-10-16 MED ORDER — BUPIVACAINE-EPINEPHRINE 0.25% -1:200000 IJ SOLN
INTRAMUSCULAR | Status: DC | PRN
  Administered 2016-10-16: 10 mL

## 2016-10-16 MED ORDER — ALLOPURINOL 300 MG PO TABS
300.0000 mg | ORAL_TABLET | Freq: Every day | ORAL | Status: DC
  Administered 2016-10-17 – 2016-10-23 (×7): 300 mg via ORAL
  Filled 2016-10-16 (×8): qty 1

## 2016-10-16 MED ORDER — MONTELUKAST SODIUM 10 MG PO TABS
10.0000 mg | ORAL_TABLET | Freq: Every day | ORAL | Status: DC
  Administered 2016-10-16 – 2016-10-22 (×6): 10 mg via ORAL
  Filled 2016-10-16 (×6): qty 1

## 2016-10-16 MED ORDER — LORAZEPAM 2 MG/ML IJ SOLN
0.5000 mg | Freq: Once | INTRAMUSCULAR | Status: AC
  Administered 2016-10-16: 0.5 mg via INTRAVENOUS
  Filled 2016-10-16: qty 1

## 2016-10-16 MED ORDER — ONDANSETRON HCL 4 MG/2ML IJ SOLN: 4.0000 mg | Freq: Four times a day (QID) | INTRAMUSCULAR | Status: DC | PRN

## 2016-10-16 MED ORDER — PROPOFOL 10 MG/ML IV BOLUS
INTRAVENOUS | Status: DC | PRN
  Administered 2016-10-16: 160 mg via INTRAVENOUS

## 2016-10-16 MED ORDER — ROCURONIUM BROMIDE 100 MG/10ML IV SOLN
INTRAVENOUS | Status: DC | PRN
  Administered 2016-10-16: 40 mg via INTRAVENOUS

## 2016-10-16 MED ORDER — SUCCINYLCHOLINE CHLORIDE 200 MG/10ML IV SOSY
PREFILLED_SYRINGE | INTRAVENOUS | Status: AC
  Filled 2016-10-16: qty 10

## 2016-10-16 MED ORDER — FERROUS SULFATE 325 (65 FE) MG PO TABS
325.0000 mg | ORAL_TABLET | Freq: Two times a day (BID) | ORAL | Status: DC
  Administered 2016-10-16 – 2016-10-19 (×6): 325 mg via ORAL
  Filled 2016-10-16 (×6): qty 1

## 2016-10-16 MED ORDER — LIDOCAINE 2% (20 MG/ML) 5 ML SYRINGE
INTRAMUSCULAR | Status: AC
  Filled 2016-10-16: qty 5

## 2016-10-16 MED ORDER — HEPARIN SODIUM (PORCINE) 5000 UNIT/ML IJ SOLN
5000.0000 [IU] | Freq: Three times a day (TID) | INTRAMUSCULAR | Status: DC
  Administered 2016-10-17 – 2016-10-22 (×18): 5000 [IU] via SUBCUTANEOUS
  Filled 2016-10-16 (×19): qty 1

## 2016-10-16 MED ORDER — OXYCODONE HCL 5 MG PO TABS: 5.0000 mg | ORAL_TABLET | ORAL | Status: DC | PRN

## 2016-10-16 MED ORDER — HEPARIN SODIUM (PORCINE) 5000 UNIT/ML IJ SOLN: 5000.0000 [IU] | Freq: Three times a day (TID) | INTRAMUSCULAR | Status: DC

## 2016-10-16 MED ORDER — ALBUMIN HUMAN 5 % IV SOLN
INTRAVENOUS | Status: DC | PRN
  Administered 2016-10-16: 15:00:00 via INTRAVENOUS

## 2016-10-16 MED ORDER — SODIUM CHLORIDE 0.9 % IV SOLN
INTRAVENOUS | Status: DC
Start: 2016-10-16 — End: 2016-10-17
  Administered 2016-10-16: 1 mL via INTRAVENOUS

## 2016-10-16 MED ORDER — VITAMIN D3 25 MCG (1000 UNIT) PO TABS
5000.0000 [IU] | ORAL_TABLET | Freq: Every day | ORAL | Status: DC
  Administered 2016-10-17 – 2016-10-19 (×3): 5000 [IU] via ORAL
  Filled 2016-10-16 (×6): qty 5

## 2016-10-16 MED ORDER — MIDAZOLAM HCL 5 MG/5ML IJ SOLN: INTRAMUSCULAR | Status: DC | PRN

## 2016-10-16 MED ORDER — SUCCINYLCHOLINE CHLORIDE 20 MG/ML IJ SOLN
INTRAMUSCULAR | Status: DC | PRN
  Administered 2016-10-16: 100 mg via INTRAVENOUS

## 2016-10-16 MED ORDER — FENTANYL CITRATE (PF) 250 MCG/5ML IJ SOLN
INTRAMUSCULAR | Status: DC | PRN
  Administered 2016-10-16 (×2): 50 ug via INTRAVENOUS

## 2016-10-16 MED ORDER — HYDRALAZINE HCL 20 MG/ML IJ SOLN: 10.0000 mg | INTRAMUSCULAR | Status: DC | PRN

## 2016-10-16 MED ORDER — ONDANSETRON HCL 4 MG/2ML IJ SOLN
INTRAMUSCULAR | Status: DC | PRN
  Administered 2016-10-16: 4 mg via INTRAVENOUS

## 2016-10-16 MED ORDER — CEFAZOLIN SODIUM 1 G IJ SOLR
INTRAMUSCULAR | Status: AC
  Filled 2016-10-16: qty 20

## 2016-10-16 SURGICAL SUPPLY — 33 items
ADH SKN CLS APL DERMABOND .7 (GAUZE/BANDAGES/DRESSINGS) ×1
BLADE CLIPPER SURG (BLADE) IMPLANT
CANISTER SUCT 3000ML PPV (MISCELLANEOUS) ×3 IMPLANT
CHLORAPREP W/TINT 26ML (MISCELLANEOUS) ×3 IMPLANT
COVER SURGICAL LIGHT HANDLE (MISCELLANEOUS) ×3 IMPLANT
DERMABOND ADVANCED (GAUZE/BANDAGES/DRESSINGS) ×2
DERMABOND ADVANCED .7 DNX12 (GAUZE/BANDAGES/DRESSINGS) ×1 IMPLANT
DRAPE LAPAROTOMY 100X72 PEDS (DRAPES) ×3 IMPLANT
ELECT REM PT RETURN 9FT ADLT (ELECTROSURGICAL) ×3
ELECTRODE REM PT RTRN 9FT ADLT (ELECTROSURGICAL) ×1 IMPLANT
GAUZE SPONGE 4X4 12PLY STRL LF (GAUZE/BANDAGES/DRESSINGS) ×2 IMPLANT
GLOVE BIOGEL PI IND STRL 7.0 (GLOVE) ×1 IMPLANT
GLOVE BIOGEL PI INDICATOR 7.0 (GLOVE) ×2
GLOVE SURG SS PI 7.0 STRL IVOR (GLOVE) ×3 IMPLANT
GOWN STRL REUS W/ TWL LRG LVL3 (GOWN DISPOSABLE) ×2 IMPLANT
GOWN STRL REUS W/TWL LRG LVL3 (GOWN DISPOSABLE) ×6
KIT BASIN OR (CUSTOM PROCEDURE TRAY) ×3 IMPLANT
KIT ROOM TURNOVER OR (KITS) ×3 IMPLANT
MESH VENTRALEX ST 8CM LRG (Mesh General) ×2 IMPLANT
NDL HYPO 25GX1X1/2 BEV (NEEDLE) ×1 IMPLANT
NEEDLE HYPO 25GX1X1/2 BEV (NEEDLE) ×3 IMPLANT
NS IRRIG 1000ML POUR BTL (IV SOLUTION) ×3 IMPLANT
PACK GENERAL/GYN (CUSTOM PROCEDURE TRAY) ×3 IMPLANT
PAD ARMBOARD 7.5X6 YLW CONV (MISCELLANEOUS) ×3 IMPLANT
SUT ETHILON 4 0 PS 2 18 (SUTURE) ×2 IMPLANT
SUT MNCRL AB 4-0 PS2 18 (SUTURE) ×3 IMPLANT
SUT PROLENE 0 CT 1 30 (SUTURE) ×12 IMPLANT
SUT VIC AB 3-0 SH 27 (SUTURE) ×12
SUT VIC AB 3-0 SH 27X BRD (SUTURE) IMPLANT
SUT VIC AB 3-0 SH 27XBRD (SUTURE) ×1 IMPLANT
SYR CONTROL 10ML LL (SYRINGE) ×3 IMPLANT
TOWEL OR 17X24 6PK STRL BLUE (TOWEL DISPOSABLE) ×3 IMPLANT
TOWEL OR 17X26 10 PK STRL BLUE (TOWEL DISPOSABLE) ×3 IMPLANT

## 2016-10-16 NOTE — ED Provider Notes (Signed)
[facility] DEPT Provider Note   CSN: [id number] Arrival date & time: 10/16/16  1004     History   Chief Complaint Chief Complaint  Patient presents with  . Hernia    HPI Donald Moreno is a 76 y.o. male.  HPI   76 year old male with history of CAD, EtOH abuse, cirrhosis, hypertension, umbilical hernia presents today with abdominal pain. Patient notes that he has an umbilical hernia that usually causes no significant pain. He notes from time to time he has a small bulge that easily goes back in. He notes pain after a BM this morning with excruciating pain to his abdomen with a large protrusion to his umbilicus. Patient notes he was unable to reduce this on his own. He denies any nausea or vomiting, reports that he had a bowel movement this morning and has been passing gas since. Patient most recently discharged from the hospital at 10/12/2016 status post transjugular intrahepatic portal systemic shunt performed by interventional radiology on the 29th.    Past Medical History:  Diagnosis Date  . Arthritis    "all over"  . Asthma   . Chronic kidney disease    "I see a kidney dr @ [facility]" (10/11/2016)  . Chronic lower back pain    "turned a truck over a long time ago" (10/11/2016)  . ETOH abuse   . Gout   . Hypertension   . Liver cirrhosis ([place]) 2015  . Pneumonia    "long long time ago" (10/11/2016)  . Spontaneous bacterial peritonitis ([place]) 07/01/2015    Patient Active Problem List   Diagnosis Date Noted  . Cirrhosis ([place]) 10/11/2016  . Hospital discharge follow-up 06/23/2016  . Advance care planning 08/01/2015  . Alcoholic cirrhosis of liver with ascites ([place])   . Typical atrial flutter ([place])   . Protein-calorie malnutrition, severe 07/02/2015  . CKD (chronic kidney disease), stage III 05/27/2015  . History of GI bleed 03/04/2015  . At risk for decreased bone density 03/04/2015  . Weight loss, unintentional 03/04/2015  . Inguinal hernia 10/28/2014  .  Bradycardia 07/08/2013  . Preventative health care 11/11/2011  . Asthma 01/29/2010  . NICOTINE ADDICTION 04/30/2007  . Gout 07/13/2006  . HYPERTENSION, BENIGN SYSTEMIC 07/13/2006  . Arthropathy 07/13/2006    Past Surgical History:  Procedure Laterality Date  . IR PARACENTESIS  08/19/2016  . IR PARACENTESIS  08/26/2016  . IR PARACENTESIS  09/02/2016  . IR PARACENTESIS  09/09/2016  . IR PARACENTESIS  09/16/2016  . IR PARACENTESIS  10/03/2016  . IR PARACENTESIS  10/11/2016  . IR TIPS  10/11/2016  . RADIOLOGY WITH ANESTHESIA N/A 10/11/2016   Procedure: TIPS;  Surgeon: Arne Cleveland, MD;  Location: [facility];  Service: Radiology;  Laterality: N/A;       Home Medications    Prior to Admission medications   Medication Sig Start Date End Date Taking? Authorizing Provider  ADVAIR DISKUS 500-50 MCG/DOSE AEPB INHALE 1 PUFF TWO TIMES  DAILY 09/16/16  Yes Katheren Shams, DO  allopurinol (ZYLOPRIM) 300 MG tablet Take 1 tablet (300 mg total) by mouth daily. 09/16/16  Yes Luiz Blare Y, DO  Cholecalciferol (VITAMIN D-3) 5000 units TABS Take 1 tablet by mouth daily.   Yes [provider]  ferrous sulfate 325 (65 FE) MG tablet Take 1 tablet (325 mg total) by mouth 2 (two) times daily with a meal. Patient taking differently: Take 325 mg by mouth daily with breakfast.  10/21/14  Yes Janora Norlander, DO  montelukast (SINGULAIR) 10 MG tablet Take 1 tablet (10 mg total) by mouth at bedtime. 09/23/15  Yes Katheren Shams, DO  PROAIR HFA 108 [id number] Base) MCG/ACT inhaler Use 2 puffs every 4 hours  as needed for wheezing or  shortness of breath 11/26/15  Yes Haney, Alyssa A, MD  Nutritional Supplements (ENSURE COMPLETE SHAKE) LIQD Take 1 Container by mouth daily. Patient not taking: Reported on 10/16/2016 03/04/15   Katheren Shams, DO    Family History Family History  Problem Relation Age of Onset  . Other Mother        died when pt was only 77 - unknown cause.  . Other Father        deceased.  . Asthma  Sister   . Heart disease Sister        multiple stents.  . Asthma Brother   . Hypertension Brother   . Asthma Son   . Asthma Brother   . Hypertension Brother     Social History Social History  Substance Use Topics  . Smoking status: Never Smoker  . Smokeless tobacco: Current User    Types: Snuff, Chew  . Alcohol use No     Comment: quit drinking "over 2 yr ago" (10/11/2016)     Allergies   Patient has no known allergies.   Review of Systems Review of Systems  All other systems reviewed and are negative.    Physical Exam Updated Vital Signs BP 128/64   Pulse (!) 54   Temp 97.6 F (36.4 C) (Oral)   Resp 16   Ht 5' 10.5" (1.791 m)   Wt 68 kg (150 lb)   SpO2 95%   BMI 21.22 kg/m   Physical Exam  Constitutional: He is oriented to person, place, and time. He appears well-developed and well-nourished.  HENT:  Head: Normocephalic and atraumatic.  Eyes: Conjunctivae are normal. Pupils are equal, round, and reactive to light. Right eye exhibits no discharge. Left eye exhibits no discharge. No scleral icterus.  Neck: Normal range of motion. No JVD present. No tracheal deviation present.  Pulmonary/Chest: Effort normal. No stridor.  Abdominal: Soft.  Bowel loop protruding through umbilicus, severe TTP, remainder of abd soft with minimal discomfrot  Neurological: He is alert and oriented to person, place, and time. Coordination normal.  Psychiatric: He has a normal mood and affect. His behavior is normal. Judgment and thought content normal.  Nursing note and vitals reviewed.    ED Treatments / Results  Labs (all labs ordered are listed, but only abnormal results are displayed) Labs Reviewed  CBC WITH DIFFERENTIAL/PLATELET - Abnormal; Notable for the following:       Result Value   RBC 3.68 (*)    Hemoglobin 12.4 (*)    HCT 36.4 (*)    Platelets 124 (*)    All other components within normal limits  BASIC METABOLIC PANEL - Abnormal; Notable for the following:      Glucose, Bld 116 (*)    All other components within normal limits    EKG  EKG Interpretation None       Radiology No results found.  Procedures Procedures (including critical care time)  Medications Ordered in ED Medications  morphine 4 MG/ML injection 4 mg (4 mg Intravenous Given 10/16/16 1112)  LORazepam (ATIVAN) injection 0.5 mg (0.5 mg Intravenous Given 10/16/16 1112)     Initial Impression / Assessment and Plan / ED Course  I have reviewed the triage vital signs and the nursing  notes.  Pertinent labs & imaging results that were available during my care of the patient were reviewed by me and considered in my medical decision making (see chart for details).      Final Clinical Impressions(s) / ED Diagnoses   Final diagnoses:  Umbilical hernia, incarcerated   76 year old male presents today with an umbilical hernia. This appears to be incarcerated, patient was given pain medication, antianxiety medicine and had several attempts at reduction they were unsuccessful here. Gen. surgery will consult for ongoing management.  Last by mouth intake at 7 AM this morning patient notes drinking water while taking this medicine no solids today.  Surgery to admit  New Prescriptions New Prescriptions   No medications on file     Francee Gentile 10/16/16 1308    Milton Ferguson, MD 10/16/16 1615

## 2016-10-16 NOTE — Progress Notes (Signed)
Pt s/p umbilical hernia repair came to the floor around 6pm, encouraged to urinate 2x  but unsuccessful, I did the bladder scan it shows 412 ml, I did in and out cath as ordered and output was 200 ml, informed the on call Dr. Hal Hope.

## 2016-10-16 NOTE — Transfer of Care (Signed)
Immediate Anesthesia Transfer of Care Note  Patient: Donald Moreno  Procedure(s) Performed: Procedure(s): HERNIA REPAIR UMBILICAL ADULT (N/A) INSERTION OF MESH (N/A)  Patient Location: PACU  Anesthesia Type:General  Level of Consciousness: drowsy  Airway & Oxygen Therapy: Patient Spontanous Breathing and Patient connected to face mask oxygen  Post-op Assessment: Report given to RN and Post -op Vital signs reviewed and stable  Post vital signs: Reviewed and stable  Last Vitals:  Vitals:   10/16/16 1200 10/16/16 1300  BP: 128/64 (!) 141/74  Pulse: (!) 54 (!) 58  Resp: 16   Temp:      Last Pain:  Vitals:   10/16/16 1315  TempSrc:   PainSc: 5          Complications: No apparent anesthesia complications

## 2016-10-16 NOTE — Anesthesia Preprocedure Evaluation (Addendum)
Anesthesia Evaluation  Patient identified by MRN, date of birth, ID band Patient awake    Reviewed: Allergy & Precautions, NPO status , Patient's Chart, lab work & pertinent test results  Airway Mallampati: II  TM Distance: >3 FB     Dental  (+) Edentulous Upper, Edentulous Lower   Pulmonary asthma , pneumonia,    breath sounds clear to auscultation       Cardiovascular hypertension,  Rhythm:Regular Rate:Normal     Neuro/Psych    GI/Hepatic Neg liver ROS, History noted. CG   Endo/Other    Renal/GU Renal disease     Musculoskeletal   Abdominal   Peds  Hematology   Anesthesia Other Findings   Reproductive/Obstetrics                            Anesthesia Physical Anesthesia Plan  ASA: III  Anesthesia Plan: General   Post-op Pain Management:    Induction: Intravenous, Rapid sequence and Cricoid pressure planned  Airway Management Planned: Oral ETT  Additional Equipment:   Intra-op Plan:   Post-operative Plan: Possible Post-op intubation/ventilation  Informed Consent: I have reviewed the patients History and Physical, chart, labs and discussed the procedure including the risks, benefits and alternatives for the proposed anesthesia with the patient or authorized representative who has indicated his/her understanding and acceptance.   Dental advisory given  Plan Discussed with: CRNA, Anesthesiologist and Surgeon  Anesthesia Plan Comments:        Anesthesia Quick Evaluation

## 2016-10-16 NOTE — Anesthesia Procedure Notes (Signed)
Procedure Name: Intubation Date/Time: 10/16/2016 3:27 PM Performed by: Maude Leriche D Pre-anesthesia Checklist: Patient identified, Emergency Drugs available, Patient being monitored, Suction available and Timeout performed Patient Re-evaluated:Patient Re-evaluated prior to inductionOxygen Delivery Method: Circle system utilized Preoxygenation: Pre-oxygenation with 100% oxygen Intubation Type: IV induction and Cricoid Pressure applied Ventilation: Mask ventilation without difficulty Laryngoscope Size: Miller and 2 Grade View: Grade I Tube type: Subglottic suction tube Tube size: 7.5 mm Number of attempts: 1 Airway Equipment and Method: Stylet Placement Confirmation: ETT inserted through vocal cords under direct vision,  positive ETCO2 and breath sounds checked- equal and bilateral Secured at: 22 cm Tube secured with: Tape Dental Injury: Teeth and Oropharynx as per pre-operative assessment

## 2016-10-16 NOTE — H&P (Signed)
Reason for Consult: hernia Referring Physician: ER  Donald Moreno is an 76 y.o. male.  HPI: 76 yo male with liver disease requiring weekly pericentesis presents with one day of abdominal pain and a large painful hernia. HE has had pain in the area frequently but not this severe. He denies nausea or vomiting. He denies fevers. He has been evaluated for hernia before but due to cirrhosis been told not to undergo repair.  Past Medical History:  Diagnosis Date  . Arthritis    "all over"  . Asthma   . Chronic kidney disease    "I see a kidney dr @ [facility]" (10/11/2016)  . Chronic lower back pain    "turned a truck over a long time ago" (10/11/2016)  . ETOH abuse   . Gout   . Hypertension   . Liver cirrhosis ([place]) 2015  . Pneumonia    "long long time ago" (10/11/2016)  . Spontaneous bacterial peritonitis ([place]) 07/01/2015    Past Surgical History:  Procedure Laterality Date  . IR PARACENTESIS  08/19/2016  . IR PARACENTESIS  08/26/2016  . IR PARACENTESIS  09/02/2016  . IR PARACENTESIS  09/09/2016  . IR PARACENTESIS  09/16/2016  . IR PARACENTESIS  10/03/2016  . IR PARACENTESIS  10/11/2016  . IR TIPS  10/11/2016  . RADIOLOGY WITH ANESTHESIA N/A 10/11/2016   Procedure: TIPS;  Surgeon: Arne Cleveland, MD;  Location: [place];  Service: Radiology;  Laterality: N/A;    Family History  Problem Relation Age of Onset  . Other Mother        died when pt was only 50 - unknown cause.  . Other Father        deceased.  . Asthma Sister   . Heart disease Sister        multiple stents.  . Asthma Brother   . Hypertension Brother   . Asthma Son   . Asthma Brother   . Hypertension Brother     Social History:  reports that he has never smoked. His smokeless tobacco use includes Snuff and Chew. He reports that he does not drink alcohol or use drugs.  Allergies: No Known Allergies  Medications: I have reviewed the patient's current medications.  Results for orders placed or performed during  the hospital encounter of 10/16/16 (from the past 48 hour(s))  CBC with Differential     Status: Abnormal   Collection Time: 10/16/16 12:01 PM  Result Value Ref Range   WBC 5.7 4.0 - 10.5 K/uL   RBC 3.68 (L) 4.22 - 5.81 MIL/uL   Hemoglobin 12.4 (L) 13.0 - 17.0 g/dL   HCT 36.4 (L) 39.0 - 52.0 %   MCV 98.9 78.0 - 100.0 fL   MCH 33.7 26.0 - 34.0 pg   MCHC 34.1 30.0 - 36.0 g/dL   RDW 13.6 11.5 - 15.5 %   Platelets 124 (L) 150 - 400 K/uL   Neutrophils Relative % 65 %   Neutro Abs 3.7 1.7 - 7.7 K/uL   Lymphocytes Relative 23 %   Lymphs Abs 1.3 0.7 - 4.0 K/uL   Monocytes Relative 7 %   Monocytes Absolute 0.4 0.1 - 1.0 K/uL   Eosinophils Relative 4 %   Eosinophils Absolute 0.2 0.0 - 0.7 K/uL   Basophils Relative 1 %   Basophils Absolute 0.0 0.0 - 0.1 K/uL  Basic metabolic panel     Status: Abnormal   Collection Time: 10/16/16 12:01 PM  Result Value Ref Range  Sodium 142 135 - 145 mmol/L   Potassium 4.2 3.5 - 5.1 mmol/L   Chloride 110 101 - 111 mmol/L   CO2 22 22 - 32 mmol/L   Glucose, Bld 116 (H) 65 - 99 mg/dL   BUN 16 6 - 20 mg/dL   Creatinine, Ser 1.11 0.61 - 1.24 mg/dL   Calcium 8.9 8.9 - 10.3 mg/dL   GFR calc non Af Amer >60 >60 mL/min   GFR calc Af Amer >60 >60 mL/min    Comment: (NOTE) The eGFR has been calculated using the CKD EPI equation. This calculation has not been validated in all clinical situations. eGFR's persistently <60 mL/min signify possible Chronic Kidney Disease.    Anion gap 10 5 - 15    No results found.  Review of Systems  Constitutional: Negative for chills and fever.  HENT: Negative for hearing loss.   Eyes: Negative for blurred vision and double vision.  Respiratory: Negative for cough and hemoptysis.   Cardiovascular: Negative for chest pain and palpitations.  Gastrointestinal: Positive for abdominal pain. Negative for nausea and vomiting.  Genitourinary: Negative for dysuria and urgency.  Musculoskeletal: Negative for myalgias and neck  pain.  Skin: Negative for itching and rash.  Neurological: Negative for dizziness, tingling and headaches.  Endo/Heme/Allergies: Does not bruise/bleed easily.  Psychiatric/Behavioral: Negative for depression and suicidal ideas.   Blood pressure (!) 141/74, pulse (!) 58, temperature 97.6 F (36.4 C), temperature source Oral, resp. rate 16, height 5' 10.5" (1.791 m), weight 68 kg (150 lb), SpO2 97 %. Physical Exam  Vitals reviewed. Constitutional: He is oriented to person, place, and time. He appears well-developed and well-nourished.  HENT:  Head: Normocephalic and atraumatic.  Eyes: Conjunctivae and EOM are normal. Pupils are equal, round, and reactive to light.  Neck: Normal range of motion. Neck supple.  Cardiovascular: Normal rate and regular rhythm.   Respiratory: Effort normal and breath sounds normal.  GI: Soft. Bowel sounds are normal. He exhibits no distension. There is tenderness.  Small neck hernia with large sac (5cm diameter), firm, no skin changes  Musculoskeletal: Normal range of motion.  Neurological: He is alert and oriented to person, place, and time.  Skin: Skin is warm and dry.  Psychiatric: He has a normal mood and affect. His behavior is normal.      Assessment/Plan: 76 yo male with child's A vs B cirrhotic who recently underwent TIPS procedure presents with incarcerated umbilical hernia. Due to his liver disease he has a significant risk of complications or death. But because it is not able to be reduced I think he requires surgery and given LFTs and coags do not see a reason to delay. We did discuss that if it reduces on induction we will not proceed and will instead watch him overnight. -OR for paralysis and likely umbilical hernia repair  [name] 10/16/2016, 1:49 PM

## 2016-10-16 NOTE — Consult Note (Addendum)
Medical Consultation   Donald Moreno  KPT:[id number]  DOB: 06-29-1940  DOA: 10/16/2016  PCP: Katheren Shams, DO   Requesting physician: Dr.Kinsinger Reason for consultation:, consult for med management  History of Present Illness: Donald Moreno is an 76 y.o. male with Advanced Alcoholic Liver Cirrhosis, malnutrition, Recurrent ascites requiring weekly paracentesis in IR, Umbilical hernia, Aflutter was admitted this morning by Dr.Kinsinger with Incarcerated Umbilical hernia. A week ago patient had a TIPS procedure due to recurrent ascites. Now seen in PACU, still drowsy so unable to provide any meaningful information. He just underwent Hernia repair with insertion of a mesh, did not have ischemic bowel per Dr.Kinsinger.  Review of Systems:  ROS: unable to obtain due to mentation   Past Medical History: Past Medical History:  Diagnosis Date  . Arthritis    "all over"  . Asthma   . Chronic kidney disease    "I see a kidney dr @ [facility]" (10/11/2016)  . Chronic lower back pain    "turned a truck over a long time ago" (10/11/2016)  . ETOH abuse   . Gout   . Hypertension   . Liver cirrhosis ([place]) 2015  . Pneumonia    "long long time ago" (10/11/2016)  . Spontaneous bacterial peritonitis ([place]) 07/01/2015    Past Surgical History: Past Surgical History:  Procedure Laterality Date  . IR PARACENTESIS  08/19/2016  . IR PARACENTESIS  08/26/2016  . IR PARACENTESIS  09/02/2016  . IR PARACENTESIS  09/09/2016  . IR PARACENTESIS  09/16/2016  . IR PARACENTESIS  10/03/2016  . IR PARACENTESIS  10/11/2016  . IR TIPS  10/11/2016  . RADIOLOGY WITH ANESTHESIA N/A 10/11/2016   Procedure: TIPS;  Surgeon: Arne Cleveland, MD;  Location: [facility];  Service: Radiology;  Laterality: N/A;     Allergies:  No Known Allergies   Social History:  reports that he has never smoked. His smokeless tobacco use includes Snuff and Chew. He reports that he does not drink alcohol or use  drugs.   Family History: Family History  Problem Relation Age of Onset  . Other Mother        died when pt was only 89 - unknown cause.  . Other Father        deceased.  . Asthma Sister   . Heart disease Sister        multiple stents.  . Asthma Brother   . Hypertension Brother   . Asthma Son   . Asthma Brother   . Hypertension Brother     Physical Exam: Vitals:   10/16/16 1300 10/16/16 1647 10/16/16 1702 10/16/16 1717  BP: (!) 141/74 (!) 161/76 (!) 153/79 (!) 156/82  Pulse: (!) 58 70 70 69  Resp:  13 11 11   Temp:  97.6 F (36.4 C)    TempSrc:      SpO2: 97% 100% 100% 100%  Weight:      Height:        Constitutional: [place] cachectic male, in PACU, arouses to verbal stimuli Eyes: PERLA, EOMI Neck: neck appears normal CVS: S1-S2 clear, no murmur rubs or gallops Respiratory: poor air movement, clear anteriorly Abdomen: soft, distended with dressing, tenderness as expected Musculoskeletal: : no cyanosis, clubbing or edema noted bilaterally        Neuro: drowsy in PACU after surgery, moves all extremities Psych:unable to assess  Data reviewed:  I have personally reviewed following labs  and imaging studies Labs:  CBC:  Recent Labs Lab 10/11/16 0702 10/16/16 1201  WBC 5.3 5.7  NEUTROABS  --  3.7  HGB 11.5* 12.4*  HCT 34.6* 36.4*  MCV 100.6* 98.9  PLT 122* 124*    Basic Metabolic Panel:  Recent Labs Lab 10/11/16 0702 10/16/16 1201  NA 141 142  K 4.2 4.2  CL 108 110  CO2 26 22  GLUCOSE 92 116*  BUN 17 16  CREATININE 1.36* 1.11  CALCIUM 9.0 8.9   GFR Estimated Creatinine Clearance: 56.2 mL/min (by C-G formula based on SCr of 1.11 mg/dL). Liver Function Tests:  Recent Labs Lab 10/11/16 0702  AST 34  ALT 14*  ALKPHOS 71  BILITOT 0.7  PROT 6.3*  ALBUMIN 3.3*   No results for input(s): LIPASE, AMYLASE in the last 168 hours. No results for input(s): AMMONIA in the last 168 hours. Coagulation profile  Recent Labs Lab 10/11/16 0702  INR  1.25    Cardiac Enzymes: No results for input(s): CKTOTAL, CKMB, CKMBINDEX, TROPONINI in the last 168 hours. BNP: Invalid input(s): POCBNP CBG: No results for input(s): GLUCAP in the last 168 hours. D-Dimer No results for input(s): DDIMER in the last 72 hours. Hgb A1c No results for input(s): HGBA1C in the last 72 hours. Lipid Profile No results for input(s): CHOL, HDL, LDLCALC, TRIG, CHOLHDL, LDLDIRECT in the last 72 hours. Thyroid function studies No results for input(s): TSH, T4TOTAL, T3FREE, THYROIDAB in the last 72 hours.  Invalid input(s): FREET3 Anemia work up No results for input(s): VITAMINB12, FOLATE, FERRITIN, TIBC, IRON, RETICCTPCT in the last 72 hours. Urinalysis    Component Value Date/Time   COLORURINE YELLOW 06/19/2016 0249   APPEARANCEUR HAZY (A) 06/19/2016 0249   LABSPEC 1.020 06/19/2016 0249   PHURINE 5.0 06/19/2016 0249   GLUCOSEU NEGATIVE 06/19/2016 0249   HGBUR NEGATIVE 06/19/2016 0249   [place] NEGATIVE 06/19/2016 0249   [place] 06/19/2016 0249   PROTEINUR NEGATIVE 06/19/2016 0249   NITRITE NEGATIVE 06/19/2016 0249   LEUKOCYTESUR NEGATIVE 06/19/2016 0249     Microbiology No results found for this or any previous visit (from the past 240 hour(s)).     Inpatient Medications:   Scheduled Meds: Continuous Infusions:   Radiological Exams on Admission: No results found.  Impression/Recommendations    Incarcerated umbilical hernia -s/p repair, no ischemic bowel -per CCS  Advanced Alcoholic Cirrhosis with ascites -s/p Recent TIPS -4L ascitic fluid drained in OR today -monitor urine output, got IL LR and 250 of Albumin in OR. -if remains lethargic tomorrow check ammonia level  Severe malnutrition -diet/supplements when able   HYPERTENSION, BENIGN SYSTEMIC -stable, monitor -not on meds at home, consider low dose nadolol or Propranolol if BP stays up  H/o atrial flutter (HCC)  - in NSR now, not on anticoagulation per  Cards notes due to liver disease/coagulopathy/thrombocytopenia  Thank you for this consultation.  Our [facility] hospitalist team will follow the patient with you.   Time Spent: 23min  Demara Lover M.D. [facility] 10/16/2016, 5:25 PM

## 2016-10-16 NOTE — Op Note (Signed)
PATIENT:  Donald Moreno  76 y.o. male  PRE-OPERATIVE DIAGNOSIS:  Incarcerated UMBILICAL HERNIA  POST-OPERATIVE DIAGNOSIS:  Incarcerated UMBILICAL HERNIA  PROCEDURE:  Procedure(s): HERNIA REPAIR UMBILICAL ADULT INSERTION OF MESH  SURGEON:  Surgeon(s): Kensi Karr, Arta Bruce, MD  ASSISTANT: none   ANESTHESIA:   local and general  Indications for procedure: Jilberto Vanderwall is a 76 y.o. year old male with symptoms of abdominal pain and firmness in hernia that was unable to reduce.  Description of procedure: The patient was brought into the operative suite. Anesthesia was administered with General endotracheal anesthesia. WHO checklist was applied. The patient was then placed in supine. The area was prepped and draped in the usual sterile fashion. Attempt was made to reduce the hernia after paralysis however it was unable to be reduced.  A vertical incision was made over the mushroom like hernia sac. And the hernia sac was entered showing a loop of intestine present. The intestine appeared inflamed but not ischemic. Cautery and blunt dissection was used to dissect down to the fascia.  The contents of the hernia sac were reduced. The hernia defect was 3 cm in diameter. 4L of serous fluid was removed from the abdominal cavity. The hernia sac was removed. Due to the size of the hernia, a 8 cm ventralex mesh was placed as an underlay using 0 prolene in 4 directions to anchor the mesh. The fascial defect was then primarily closed with interrupted 0 prolene sutures. The fascia was closed with a running 0 PDS suture. The deep dermal space was closed with a 3-0 vicryl. 10 ml 0.25% marcaine with epinephrine was injected into the muscle layer and around the fascia. The skin was closed with a 4-0 nylon running vertical mattress suture. Gauze and tape was put in place for dressing. The patient awoke from anesthesia and was brought to pacu in stable condition. All counts were correct.  Findings: incarcerated  small intestine of umbilical hernia, 4L ascitic fluid  Specimen: none  Blood loss: Total I/O In: 6438 [I.V.:800; IV Piggyback:250] Out: 50 [Blood:50] ml  Local anesthesia: 10 ml 0.25% marcaine with epinephrine  Complications: none  PLAN OF CARE: Admit to inpatient   PATIENT DISPOSITION:  PACU - hemodynamically stable.  Gurney Maxin, M.D. General, Bariatric, & Minimally Invasive Surgery [facility], [place]  10/16/2016 4:36 PM

## 2016-10-16 NOTE — ED Triage Notes (Signed)
Per [facility], Pt is coming from home with complaints of abdominal pain and protrusion noted to the anterior of the stomach that started this morning. Hx of the same. No surgeries. Denies N/V/D

## 2016-10-16 NOTE — Anesthesia Postprocedure Evaluation (Signed)
Anesthesia Post Note  Patient: Donald Moreno  Procedure(s) Performed: Procedure(s) (LRB): HERNIA REPAIR UMBILICAL ADULT (N/A) INSERTION OF MESH (N/A)     Patient location during evaluation: PACU Anesthesia Type: General Level of consciousness: awake Pain management: pain level controlled Vital Signs Assessment: post-procedure vital signs reviewed and stable Respiratory status: spontaneous breathing Cardiovascular status: stable Anesthetic complications: no    Last Vitals:  Vitals:   10/16/16 1702 10/16/16 1717  BP: (!) 153/79 (!) 156/82  Pulse: 70 69  Resp: 11 11  Temp:      Last Pain:  Vitals:   10/16/16 1315  TempSrc:   PainSc: 5                  Sage Kopera

## 2016-10-17 ENCOUNTER — Encounter (HOSPITAL_COMMUNITY): Payer: Self-pay | Admitting: General Surgery

## 2016-10-17 LAB — COMPREHENSIVE METABOLIC PANEL
ALBUMIN: 2.7 g/dL — AB (ref 3.5–5.0)
ALK PHOS: 72 U/L (ref 38–126)
ALT: 27 U/L (ref 17–63)
ANION GAP: 4 — AB (ref 5–15)
AST: 39 U/L (ref 15–41)
BILIRUBIN TOTAL: 0.8 mg/dL (ref 0.3–1.2)
BUN: 16 mg/dL (ref 6–20)
CALCIUM: 8.4 mg/dL — AB (ref 8.9–10.3)
CO2: 26 mmol/L (ref 22–32)
Chloride: 108 mmol/L (ref 101–111)
Creatinine, Ser: 1.04 mg/dL (ref 0.61–1.24)
GFR calc Af Amer: >60 mL/min (ref >60)
Glucose, Bld: 87 mg/dL (ref 65–99)
Potassium: 3.8 mmol/L (ref 3.5–5.1)
Sodium: 138 mmol/L (ref 135–145)
TOTAL PROTEIN: 5.2 g/dL — AB (ref 6.5–8.1)

## 2016-10-17 LAB — CBC
HEMATOCRIT: 34.5 % — AB (ref 39.0–52.0)
HEMOGLOBIN: 11.1 g/dL — AB (ref 13.0–17.0)
MCH: 32.7 pg (ref 26.0–34.0)
MCHC: 32.2 g/dL (ref 30.0–36.0)
MCV: 101.8 fL — AB (ref 78.0–100.0)
Platelets: 130 10*3/uL — ABNORMAL LOW (ref 150–400)
RBC: 3.39 MIL/uL — ABNORMAL LOW (ref 4.22–5.81)
RDW: 13.7 % (ref 11.5–15.5)
WBC: 7.6 10*3/uL (ref 4.0–10.5)

## 2016-10-17 MED ORDER — ALBUMIN HUMAN 25 % IV SOLN
12.5000 g | Freq: Once | INTRAVENOUS | Status: AC
  Administered 2016-10-17: 12.5 g via INTRAVENOUS
  Filled 2016-10-17: qty 50

## 2016-10-17 MED ORDER — MORPHINE SULFATE (PF) 4 MG/ML IV SOLN: 1.0000 mg | INTRAVENOUS | Status: DC | PRN

## 2016-10-17 MED ORDER — OXYCODONE HCL 5 MG PO TABS: 5.0000 mg | ORAL_TABLET | ORAL | Status: DC | PRN

## 2016-10-17 MED ORDER — SODIUM CHLORIDE 0.45 % IV BOLUS
500.0000 mL | Freq: Once | INTRAVENOUS | Status: AC
  Administered 2016-10-17: 500 mL via INTRAVENOUS

## 2016-10-17 NOTE — Progress Notes (Signed)
Patient ID: Donald Moreno, male   DOB: 09-16-1940, 76 y.o.   MRN: [id number]  [facility] Surgery Progress Note  1 Day Post-Op  Subjective: CC- umbilical hernia, abdominal soreness Sitting up in bed. States that he is sore but overall feeling well. Tolerating liquids and feels hungry. No flatus or BM. Has ambulated to bathroom. Patient was unable to urinate this morning therefore foley catheter was placed.  Lives with niece.  Objective: Vital signs in last 24 hours: Temp:  [97.2 F (36.2 C)-98.3 F (36.8 C)] 98.3 F (36.8 C) (06/04 0616) Pulse Rate:  [48-79] 79 (06/04 0616) Resp:  [11-16] 16 (06/04 0616) BP: (113-161)/(56-82) 146/78 (06/04 0616) SpO2:  [95 %-100 %] 98 % (06/04 0616) Weight:  [150 lb (68 kg)] 150 lb (68 kg) (06/03 1012) Last BM Date: 10/15/16  Intake/Output from previous day: 06/03 0701 - 06/04 0700 In: 1453.7 [P.O.:240; I.V.:963.7; IV Piggyback:250] Out: 450 [Urine:400; Blood:50] Intake/Output this shift: No intake/output data recorded.  PE: Gen:  Alert, NAD, pleasant Pulm:  CTAB, no W/R/R, effort normal Abd: Soft, mild distension, +BS, periumbilical incision C/D/I with sutures intact  Lab Results:   Recent Labs  10/16/16 1201 10/17/16 0346  WBC 5.7 7.6  HGB 12.4* 11.1*  HCT 36.4* 34.5*  PLT 124* 130*   BMET  Recent Labs  10/16/16 1201 10/17/16 0346  NA 142 138  K 4.2 3.8  CL 110 108  CO2 22 26  GLUCOSE 116* 87  BUN 16 16  CREATININE 1.11 1.04  CALCIUM 8.9 8.4*   PT/INR No results for input(s): LABPROT, INR in the last 72 hours. CMP     Component Value Date/Time   NA 138 10/17/2016 0346   K 3.8 10/17/2016 0346   CL 108 10/17/2016 0346   CO2 26 10/17/2016 0346   GLUCOSE 87 10/17/2016 0346   BUN 16 10/17/2016 0346   CREATININE 1.04 10/17/2016 0346   CREATININE 1.19 (H) 09/29/2016 1701   CALCIUM 8.4 (L) 10/17/2016 0346   PROT 5.2 (L) 10/17/2016 0346   ALBUMIN 2.7 (L) 10/17/2016 0346   AST 39 10/17/2016 0346   ALT 27  10/17/2016 0346   ALKPHOS 72 10/17/2016 0346   BILITOT 0.8 10/17/2016 0346   GFRNONAA >60 10/17/2016 0346   GFRNONAA 60 09/29/2016 1701   GFRAA >60 10/17/2016 0346   GFRAA 69 09/29/2016 1701   Lipase     Component Value Date/Time   LIPASE 29 06/19/2016 0247       Studies/Results: No results found.  Anti-infectives: Anti-infectives    None       Assessment/Plan Incarcerated UMBILICAL HERNIA S/p HERNIA REPAIR UMBILICAL ADULT INSERTION OF MESH 6/3 Dr. Kieth Brightly - POD 1 - WBC WNL, afebrile  Advanced alcoholic liver cirrhosis- requires weekly paracentesis; s/p TIPS procedure about 1 week ago H/o Atrial flutter - now in NSR, not on anticoagulation HTN Severe malnutrition  ID - none FEN - regular diet VTE - SCDs, heparin  Plan - give 12.5mg  IV albumin 25% and 500cc bolus IVF. Continue foley catheter and strict monitoring I&O's. Please ambulate with patient.    LOS: 1 day    [name] , [facility] 10/17/2016, 8:00 AM Pager: [phone] Consults: [phone] Mon-Fri 7:00 am-4:30 pm Sat-Sun 7:00 am-11:30 am

## 2016-10-17 NOTE — Care Management Note (Signed)
Case Management Note  Patient Details  Name: Donald Moreno MRN: [id number] Date of Birth: 1940-11-25  Subjective/Objective:                    Action/Plan: Incarcerated UMBILICAL HERNIA S/p HERNIA REPAIR UMBILICAL ADULT INSERTION OF MESH 6/3  Advanced alcoholic liver cirrhosis- requires weekly paracentesis; s/p TIPS procedure about 1 week ago  Plan - give 12.5mg  IV albumin 25% and 500cc bolus IVF. Continue foley catheter and strict monitoring I&O's.  ambulate  patient.   Expected Discharge Date:                  Expected Discharge Plan:  Home/Self Care  In-House Referral:     Discharge planning Services     Post Acute Care Choice:    Choice offered to:     DME Arranged:    DME Agency:     HH Arranged:    HH Agency:     Status of Service:  In process, will continue to follow  If discussed at Long Length of Stay Meetings, dates discussed:    Additional Comments:  Marilu Favre, RN 10/17/2016, 10:58 AM

## 2016-10-17 NOTE — Progress Notes (Signed)
Pt unable to urinate for the second time bladder scan 497 ml Dr. Kieth Brightly ordered foley cath and urine output 200 ml, will continue to monitor.

## 2016-10-17 NOTE — Progress Notes (Signed)
PROGRESS NOTE    Donald Moreno  ZOX:[id number] DOB: [birth year] DOA: 10/16/2016 PCP: Katheren Shams, DO  Brief Narrative:Donald Moreno is an 76 y.o. male with Advanced Alcoholic Liver Cirrhosis, malnutrition, Recurrent ascites requiring weekly paracentesis in IR, Umbilical hernia, Aflutter was admitted this morning by Dr.Kinsinger with Incarcerated Umbilical hernia. A week ago patient had a TIPS procedure due to recurrent ascites. He just underwent Hernia repair with insertion of a mesh 6/3-no ischemic bowel   Assessment & Plan:     Incarcerated umbilical hernia -s/p repair, no ischemic bowel -per CCS -start diet -Ambulate, PT  Advanced Alcoholic Cirrhosis with ascites -s/p Recent TIPS -4L ascitic fluid drained in OR 6/3, urine output only fair, monitor -creatinine normal -start PO if ok with CCS  Severe malnutrition -diet/supplements when able   HYPERTENSION, BENIGN SYSTEMIC -stable, monitor -not on meds at home  H/o atrial flutter (HCC)  - in NSR now, not on anticoagulation per Cards notes due to liver disease/coagulopathy/thrombocytopenia  DVT proph: Hep Q added, monitor platelets  We will FU in am  Subjective: Feels well, passing gas  Objective: Vitals:   10/16/16 2210 10/17/16 0207 10/17/16 0616 10/17/16 1031  BP: 117/68 (!) 113/56 (!) 146/78 (!) 112/56  Pulse: (!) 57 (!) 48 79 60  Resp: 16 15 16 16   Temp: 97.5 F (36.4 C) 98 F (36.7 C) 98.3 F (36.8 C) 97.8 F (36.6 C)  TempSrc: Oral   Oral  SpO2: 99% 100% 98% 100%  Weight:      Height:        Intake/Output Summary (Last 24 hours) at 10/17/16 1201 Last data filed at 10/17/16 8119  Gross per 24 hour  Intake          1693.67 ml  Output              450 ml  Net          1243.67 ml   Filed Weights   10/16/16 1012  Weight: 68 kg (150 lb)    Examination:  General exam:cachectic male, laying in bed, AAOx3 Respiratory system: CTAB anteriorly Cardiovascular system: S1 & S2 heard, RRR. No JVD,  murmurs Gastrointestinal system: Abdomen is soft, distended, dressing noted, BS present Central nervous system: Alert and oriented. No focal neurological deficits. Extremities: Symmetric 5 x 5 power. Skin: No rashes, lesions or ulcers Psychiatry: Judgement and insight appear normal. Mood & affect appropriate.     Data Reviewed:   CBC:  Recent Labs Lab 10/11/16 0702 10/16/16 1201 10/17/16 0346  WBC 5.3 5.7 7.6  NEUTROABS  --  3.7  --   HGB 11.5* 12.4* 11.1*  HCT 34.6* 36.4* 34.5*  MCV 100.6* 98.9 101.8*  PLT 122* 124* 147*   Basic Metabolic Panel:  Recent Labs Lab 10/11/16 0702 10/16/16 1201 10/17/16 0346  NA 141 142 138  K 4.2 4.2 3.8  CL 108 110 108  CO2 26 22 26   GLUCOSE 92 116* 87  BUN 17 16 16   CREATININE 1.36* 1.11 1.04  CALCIUM 9.0 8.9 8.4*   GFR: Estimated Creatinine Clearance: 59.9 mL/min (by C-G formula based on SCr of 1.04 mg/dL). Liver Function Tests:  Recent Labs Lab 10/11/16 0702 10/17/16 0346  AST 34 39  ALT 14* 27  ALKPHOS 71 72  BILITOT 0.7 0.8  PROT 6.3* 5.2*  ALBUMIN 3.3* 2.7*   No results for input(s): LIPASE, AMYLASE in the last 168 hours. No results for input(s): AMMONIA in the last 168 hours. Coagulation Profile:  Recent Labs Lab 10/11/16 0702  INR 1.25   Cardiac Enzymes: No results for input(s): CKTOTAL, CKMB, CKMBINDEX, TROPONINI in the last 168 hours. BNP (last 3 results) No results for input(s): PROBNP in the last 8760 hours. HbA1C: No results for input(s): HGBA1C in the last 72 hours. CBG: No results for input(s): GLUCAP in the last 168 hours. Lipid Profile: No results for input(s): CHOL, HDL, LDLCALC, TRIG, CHOLHDL, LDLDIRECT in the last 72 hours. Thyroid Function Tests: No results for input(s): TSH, T4TOTAL, FREET4, T3FREE, THYROIDAB in the last 72 hours. Anemia Panel: No results for input(s): VITAMINB12, FOLATE, FERRITIN, TIBC, IRON, RETICCTPCT in the last 72 hours. Urine analysis:    Component Value  Date/Time   COLORURINE YELLOW 06/19/2016 0249   APPEARANCEUR HAZY (A) 06/19/2016 0249   LABSPEC 1.020 06/19/2016 0249   PHURINE 5.0 06/19/2016 0249   GLUCOSEU NEGATIVE 06/19/2016 0249   HGBUR NEGATIVE 06/19/2016 0249   BILIRUBINUR NEGATIVE 06/19/2016 0249   KETONESUR NEGATIVE 06/19/2016 0249   PROTEINUR NEGATIVE 06/19/2016 0249   NITRITE NEGATIVE 06/19/2016 0249   LEUKOCYTESUR NEGATIVE 06/19/2016 0249   Sepsis Labs: @LABRCNTIP (procalcitonin:4,lacticidven:4)  )No results found for this or any previous visit (from the past 240 hour(s)).       Radiology Studies: No results found.      Scheduled Meds: . allopurinol  300 mg Oral Daily  . cholecalciferol  5,000 Units Oral Daily  . ferrous sulfate  325 mg Oral BID WC  . heparin subcutaneous  5,000 Units Subcutaneous Q8H  . mometasone-formoterol  2 puff Inhalation BID  . montelukast  10 mg Oral QHS   Continuous Infusions:   LOS: 1 day    Time spent: [place], MD [facility] Pager [phone]  If 7PM-7AM, please contact night-coverage [email or web address] Password [email or web address] 10/17/2016, 12:01 PM

## 2016-10-18 ENCOUNTER — Encounter (HOSPITAL_COMMUNITY): Payer: Self-pay | Admitting: General Practice

## 2016-10-18 LAB — BASIC METABOLIC PANEL
Anion gap: 6 (ref 5–15)
BUN: 20 mg/dL (ref 6–20)
CALCIUM: 8.1 mg/dL — AB (ref 8.9–10.3)
CO2: 24 mmol/L (ref 22–32)
CREATININE: 1.41 mg/dL — AB (ref 0.61–1.24)
Chloride: 104 mmol/L (ref 101–111)
GFR calc non Af Amer: 48 mL/min — ABNORMAL LOW (ref >60)
GFR, EST AFRICAN AMERICAN: 55 mL/min — AB (ref >60)
Glucose, Bld: 121 mg/dL — ABNORMAL HIGH (ref 65–99)
Potassium: 4.2 mmol/L (ref 3.5–5.1)
SODIUM: 134 mmol/L — AB (ref 135–145)

## 2016-10-18 LAB — CBC
HCT: 34.3 % — ABNORMAL LOW (ref 39.0–52.0)
Hemoglobin: 11.2 g/dL — ABNORMAL LOW (ref 13.0–17.0)
MCH: 33 pg (ref 26.0–34.0)
MCHC: 32.7 g/dL (ref 30.0–36.0)
MCV: 101.2 fL — ABNORMAL HIGH (ref 78.0–100.0)
Platelets: 143 10*3/uL — ABNORMAL LOW (ref 150–400)
RBC: 3.39 MIL/uL — ABNORMAL LOW (ref 4.22–5.81)
RDW: 13.6 % (ref 11.5–15.5)
WBC: 8.9 10*3/uL (ref 4.0–10.5)

## 2016-10-18 MED ORDER — ALBUMIN HUMAN 25 % IV SOLN
12.5000 g | Freq: Once | INTRAVENOUS | Status: AC
  Administered 2016-10-18: 12.5 g via INTRAVENOUS
  Filled 2016-10-18: qty 50

## 2016-10-18 MED ORDER — ENSURE ENLIVE PO LIQD
237.0000 mL | Freq: Two times a day (BID) | ORAL | Status: DC
  Administered 2016-10-18 (×2): 237 mL via ORAL

## 2016-10-18 MED ORDER — SODIUM CHLORIDE 0.45 % IV SOLN
INTRAVENOUS | Status: DC
  Administered 2016-10-18 – 2016-10-19 (×3): via INTRAVENOUS

## 2016-10-18 MED ORDER — MORPHINE SULFATE (PF) 4 MG/ML IV SOLN: 1.0000 mg | Freq: Four times a day (QID) | INTRAVENOUS | Status: DC | PRN

## 2016-10-18 MED ORDER — DOCUSATE SODIUM 100 MG PO CAPS
100.0000 mg | ORAL_CAPSULE | Freq: Two times a day (BID) | ORAL | Status: DC
  Administered 2016-10-18 – 2016-10-19 (×3): 100 mg via ORAL
  Filled 2016-10-18 (×3): qty 1

## 2016-10-18 MED ORDER — SODIUM CHLORIDE 0.45 % IV BOLUS
500.0000 mL | Freq: Once | INTRAVENOUS | Status: AC
  Administered 2016-10-18: 500 mL via INTRAVENOUS

## 2016-10-18 MED ORDER — SODIUM CHLORIDE 0.45 % IV SOLN: INTRAVENOUS | Status: DC

## 2016-10-18 MED ORDER — POLYETHYLENE GLYCOL 3350 17 G PO PACK
17.0000 g | PACK | Freq: Every day | ORAL | Status: DC
  Administered 2016-10-18 – 2016-10-23 (×6): 17 g via ORAL
  Filled 2016-10-18 (×6): qty 1

## 2016-10-18 NOTE — Progress Notes (Signed)
PROGRESS NOTE    Donald Moreno  SEG:[id number] DOB: [birth year] DOA: 10/16/2016 PCP: Katheren Shams, DO  Brief Narrative:Donald Moreno is an 76 y.o. male with Advanced Alcoholic Liver Cirrhosis, malnutrition, Recurrent ascites requiring weekly paracentesis in IR, Umbilical hernia, Aflutter was admitted this morning by Dr.Kinsinger with Incarcerated Umbilical hernia. A week ago patient had a TIPS procedure due to recurrent ascites. He just underwent Hernia repair with insertion of a mesh 6/3-no ischemic bowel   Assessment & Plan:     Incarcerated umbilical hernia -s/p repair, no ischemic bowel -per CCS -improving, tolerating diet, urine output poor-mild bump in craetinuine agree with gentle IVF and albumin today- -bmet in am, monitor urine output and DC foley tomorrow if this is improved -Ambulate, PT  Advanced Alcoholic Cirrhosis with ascites -s/p Recent TIPS -4L ascitic fluid drained in OR 6/3, urine output poor, mild bump ion creat, given gentle IVfa dn albumin now, Bmet in am  Severe malnutrition -diet/supplements as tolerated   HYPERTENSION, BENIGN SYSTEMIC -stable, monitor -not on meds at home  H/o atrial flutter (HCC)  - in NSR now, not on anticoagulation per Cards notes due to liver disease/coagulopathy/thrombocytopenia  DVT proph: Hep SQ added, monitor platelets  We will FU in am  Subjective: Feels well, passing gas, eating ok, no N/V, no BM yet  Objective: Vitals:   10/17/16 2036 10/17/16 2130 10/18/16 0420 10/18/16 0910  BP:  116/63 (!) 117/59   Pulse:  72 71   Resp:  16 16   Temp:  99.4 F (37.4 C) 99.3 F (37.4 C)   TempSrc:  Oral Oral   SpO2: 97% 98% 99% 98%  Weight:      Height:        Intake/Output Summary (Last 24 hours) at 10/18/16 1344 Last data filed at 10/18/16 1313  Gross per 24 hour  Intake             1170 ml  Output              250 ml  Net              920 ml   Filed Weights   10/16/16 1012  Weight: 68 kg (150 lb)     Examination:  Awake Alert, Oriented X 3, cachectic male, in good spirits NEck: Supple Neck,No JVD, No cervical lymphadenopathy appriciated.  Lungs: Clear anteriorly CVS: RRR,No Gallops,Rubs Abd: soft, distended, mild tenderness as expected, BS present, dressing noted Ext: no edema     Data Reviewed:   CBC:  Recent Labs Lab 10/16/16 1201 10/17/16 0346 10/18/16 0412  WBC 5.7 7.6 8.9  NEUTROABS 3.7  --   --   HGB 12.4* 11.1* 11.2*  HCT 36.4* 34.5* 34.3*  MCV 98.9 101.8* 101.2*  PLT 124* 130* 737*   Basic Metabolic Panel:  Recent Labs Lab 10/16/16 1201 10/17/16 0346 10/18/16 0412  NA 142 138 134*  K 4.2 3.8 4.2  CL 110 108 104  CO2 22 26 24   GLUCOSE 116* 87 121*  BUN 16 16 20   CREATININE 1.11 1.04 1.41*  CALCIUM 8.9 8.4* 8.1*   GFR: Estimated Creatinine Clearance: 44.2 mL/min (A) (by C-G formula based on SCr of 1.41 mg/dL (H)). Liver Function Tests:  Recent Labs Lab 10/17/16 0346  AST 39  ALT 27  ALKPHOS 72  BILITOT 0.8  PROT 5.2*  ALBUMIN 2.7*   No results for input(s): LIPASE, AMYLASE in the last 168 hours. No results for input(s): AMMONIA in the last  168 hours. Coagulation Profile: No results for input(s): INR, PROTIME in the last 168 hours. Cardiac Enzymes: No results for input(s): CKTOTAL, CKMB, CKMBINDEX, TROPONINI in the last 168 hours. BNP (last 3 results) No results for input(s): PROBNP in the last 8760 hours. HbA1C: No results for input(s): HGBA1C in the last 72 hours. CBG: No results for input(s): GLUCAP in the last 168 hours. Lipid Profile: No results for input(s): CHOL, HDL, LDLCALC, TRIG, CHOLHDL, LDLDIRECT in the last 72 hours. Thyroid Function Tests: No results for input(s): TSH, T4TOTAL, FREET4, T3FREE, THYROIDAB in the last 72 hours. Anemia Panel: No results for input(s): VITAMINB12, FOLATE, FERRITIN, TIBC, IRON, RETICCTPCT in the last 72 hours. Urine analysis:    Component Value Date/Time   COLORURINE YELLOW  06/19/2016 0249   APPEARANCEUR HAZY (A) 06/19/2016 0249   LABSPEC 1.020 06/19/2016 0249   PHURINE 5.0 06/19/2016 0249   GLUCOSEU NEGATIVE 06/19/2016 0249   HGBUR NEGATIVE 06/19/2016 0249   BILIRUBINUR NEGATIVE 06/19/2016 0249   KETONESUR NEGATIVE 06/19/2016 0249   PROTEINUR NEGATIVE 06/19/2016 0249   NITRITE NEGATIVE 06/19/2016 0249   LEUKOCYTESUR NEGATIVE 06/19/2016 0249   Sepsis Labs: @LABRCNTIP (procalcitonin:4,lacticidven:4)  )No results found for this or any previous visit (from the past 240 hour(s)).       Radiology Studies: No results found.      Scheduled Meds: . allopurinol  300 mg Oral Daily  . cholecalciferol  5,000 Units Oral Daily  . docusate sodium  100 mg Oral BID  . feeding supplement (ENSURE ENLIVE)  237 mL Oral BID BM  . ferrous sulfate  325 mg Oral BID WC  . heparin subcutaneous  5,000 Units Subcutaneous Q8H  . mometasone-formoterol  2 puff Inhalation BID  . montelukast  10 mg Oral QHS  . polyethylene glycol  17 g Oral Daily   Continuous Infusions: . sodium chloride 50 mL/hr at 10/18/16 0854     LOS: 2 days    Time spent: 97min    Domenic Polite, MD [facility] Pager [phone]  If 7PM-7AM, please contact night-coverage [email or web address] Password [email or web address] 10/18/2016, 1:44 PM

## 2016-10-18 NOTE — Progress Notes (Signed)
Patient ID: Donald Moreno, male   DOB: 12/10/1940, 76 y.o.   MRN: [id number]  [facility] Surgery Progress Note  2 Days Post-Op  Subjective: CC- abdominal soreness Sitting up in chair. Tolerating regular diet. Passing flatus, no BM. Denies n/v. States that his abdomen does feel a little more distended today than yesterday. He has been ambulating well in the halls. Only had 350cc urine/24hr and Cr elevated to 1.4 today.  Objective: Vital signs in last 24 hours: Temp:  [97.8 F (36.6 C)-99.4 F (37.4 C)] 99.3 F (37.4 C) (06/05 0420) Pulse Rate:  [56-72] 71 (06/05 0420) Resp:  [16-17] 16 (06/05 0420) BP: (112-117)/(56-63) 117/59 (06/05 0420) SpO2:  [97 %-100 %] 99 % (06/05 0420) Last BM Date:  (not since surgery)  Intake/Output from previous day: 06/04 0701 - 06/05 0700 In: 930 [P.O.:930] Out: 350 [Urine:350] Intake/Output this shift: No intake/output data recorded.  PE: Gen:  Alert, NAD, pleasant Card:  RRR, no M/G/R heard Pulm:  CTAB, no W/R/R, effort normal Abd: Soft, distended, NT, +BS, no HSM, no hernia, incisions C/D/I Ext:  No erythema, edema, or tenderness BUE/BLE   Lab Results:   Recent Labs  10/17/16 0346 10/18/16 0412  WBC 7.6 8.9  HGB 11.1* 11.2*  HCT 34.5* 34.3*  PLT 130* 143*   BMET  Recent Labs  10/17/16 0346 10/18/16 0412  NA 138 134*  K 3.8 4.2  CL 108 104  CO2 26 24  GLUCOSE 87 121*  BUN 16 20  CREATININE 1.04 1.41*  CALCIUM 8.4* 8.1*   PT/INR No results for input(s): LABPROT, INR in the last 72 hours. CMP     Component Value Date/Time   NA 134 (L) 10/18/2016 0412   K 4.2 10/18/2016 0412   CL 104 10/18/2016 0412   CO2 24 10/18/2016 0412   GLUCOSE 121 (H) 10/18/2016 0412   BUN 20 10/18/2016 0412   CREATININE 1.41 (H) 10/18/2016 0412   CREATININE 1.19 (H) 09/29/2016 1701   CALCIUM 8.1 (L) 10/18/2016 0412   PROT 5.2 (L) 10/17/2016 0346   ALBUMIN 2.7 (L) 10/17/2016 0346   AST 39 10/17/2016 0346   ALT 27 10/17/2016 0346   ALKPHOS 72 10/17/2016 0346   BILITOT 0.8 10/17/2016 0346   GFRNONAA 48 (L) 10/18/2016 0412   GFRNONAA 60 09/29/2016 1701   GFRAA 55 (L) 10/18/2016 0412   GFRAA 69 09/29/2016 1701   Lipase     Component Value Date/Time   LIPASE 29 06/19/2016 0247       Studies/Results: No results found.  Anti-infectives: Anti-infectives    None       Assessment/Plan Incarcerated UMBILICAL HERNIA S/p HERNIA REPAIR UMBILICAL ADULT INSERTION OF MESH 6/3 Dr. Kieth Brightly - POD 2 - WBC WNL, afebrile  Oliguria - foley placed 6/4 Advanced alcoholic liver cirrhosis- requires weekly paracentesis; s/p TIPS procedure about 1 week ago H/o Atrial flutter - now in NSR, not on anticoagulation at home HTN Severe malnutrition  ID - none FEN - regular diet, add Ensure BID VTE - SCDs, heparin  Plan - Continues to have low UOP and Cr increased to 1.4 today. Increase IVF to 45mL/hr. Give another 12.5mg  IV albumin 25% and 500cc bolus IVF today. Continue foley catheter and strict monitoring I&O's. PT consult pending; continue to ambulate. Add daily miralax and colace. Labs in AM.    LOS: 2 days    [name] , [facility] Surgery 10/18/2016, 7:43 AM Pager: [phone] Consults: [phone] Mon-Fri 7:00 am-4:30 pm Sat-Sun  7:00 am-11:30 am

## 2016-10-18 NOTE — Evaluation (Signed)
Physical Therapy Evaluation Patient Details Name: Donald Moreno MRN: [id number] DOB: August 06, 1940 Today's Date: 10/18/2016   History of Present Illness  Pt is a 76 y/o male admitted secondary to incarcerated umbilical hernia. Pt is s/p hernia repair on 6/3. PMH includes HTN, a flutter, alcoholic cirrhosis of liver needing weekly paracentesis, alcohol abuse, SKD, asthma, s/p TIPS shunt on 5/29.   Clinical Impression  Pt is s/p surgery above with deficits below. PTA, pt reports he was independent with ambulation. Upon evaluation, pt with decreased steadiness and strength, as well as limited secondary to abdominal pain. Requiring from min guard to min A for safety during mobility. Pt reports his niece and nephew can assist as needed and has all necessary DME at home. Recommending HHPT to increase functional mobility independence upon return home. Will continue to follow acutely to maximize functional mobility independence.     Follow Up Recommendations Home health PT;Supervision/Assistance - 24 hour    Equipment Recommendations  None recommended by PT    Recommendations for Other Services       Precautions / Restrictions Precautions Precautions: Fall Restrictions Weight Bearing Restrictions: No      Mobility  Bed Mobility Overal bed mobility: Needs Assistance Bed Mobility: Supine to Sit;Sit to Supine     Supine to sit: Min guard Sit to supine: Min guard   General bed mobility comments: Min guard for safety. Continuous verbal cues for use of log roll technique to decrease stress on abdomen. Required manual cues as well to ensure proper technique   Transfers Overall transfer level: Needs assistance Equipment used: Rolling walker (2 wheeled) Transfers: Sit to/from Stand Sit to Stand: Min guard         General transfer comment: Min guard for safety. Verbal cues to wait for PT to stand.   Ambulation/Gait Ambulation/Gait assistance: Min guard Ambulation Distance (Feet): 200  Feet Assistive device: Rolling walker (2 wheeled) Gait Pattern/deviations: Step-through pattern;Decreased stride length;Trunk flexed Gait velocity: Decreased Gait velocity interpretation: Below normal speed for age/gender General Gait Details: Slow, guarded gait. Verbal cues for upright posture, however, pt stating "this is good as it's going to get" because of reports of abdominal pain. Min guard for safety. Occaisional min A for balance during turns.   Stairs            Wheelchair Mobility    Modified Rankin (Stroke Patients Only)       Balance Overall balance assessment: Needs assistance Sitting-balance support: Feet supported;No upper extremity supported Sitting balance-Leahy Scale: Good     Standing balance support: Bilateral upper extremity supported;During functional activity Standing balance-Leahy Scale: Poor Standing balance comment: Reliant on RW                              Pertinent Vitals/Pain Pain Assessment: 0-10 Pain Score: 6  Pain Location: Stomach Pain Descriptors / Indicators: Sore Pain Intervention(s): Limited activity within patient's tolerance;Monitored during session;Repositioned    Home Living Family/patient expects to be discharged to:: Private residence Living Arrangements: Other relatives (neice and nephew ) Available Help at Discharge: Family;Available 24 hours/day Type of Home: House Home Access: Level entry     Home Layout: One level Home Equipment: Shower seat;Tub bench;Wheelchair - manual;Cane - single point;Walker - 2 wheels;Wheelchair - power;Bedside commode      Prior Function Level of Independence: Independent               Hand Dominance   Dominant Hand:  Right    Extremity/Trunk Assessment   Upper Extremity Assessment Upper Extremity Assessment: Overall WFL for tasks assessed    Lower Extremity Assessment Lower Extremity Assessment: Generalized weakness (Grossly 4-/5 throughout )    Cervical /  Trunk Assessment Cervical / Trunk Assessment: Kyphotic  Communication   Communication: No difficulties  Cognition Arousal/Alertness: Awake/alert Behavior During Therapy: WFL for tasks assessed/performed Overall Cognitive Status: Within Functional Limits for tasks assessed                                        General Comments      Exercises     Assessment/Plan    PT Assessment Patient needs continued PT services  PT Problem List Decreased strength;Decreased activity tolerance;Decreased balance;Decreased mobility;Decreased knowledge of use of DME;Decreased knowledge of precautions       PT Treatment Interventions DME instruction;Gait training;Functional mobility training;Therapeutic activities;Therapeutic exercise;Balance training;Neuromuscular re-education;Patient/family education    PT Goals (Current goals can be found in the Care Plan section)  Acute Rehab PT Goals Patient Stated Goal: to go home  PT Goal Formulation: With patient Time For Goal Achievement: 10/25/16 Potential to Achieve Goals: Good    Frequency Min 3X/week   Barriers to discharge        Co-evaluation               AM-PAC PT "6 Clicks" Daily Activity  Outcome Measure Difficulty turning over in bed (including adjusting bedclothes, sheets and blankets)?: A Lot Difficulty moving from lying on back to sitting on the side of the bed? : Total Difficulty sitting down on and standing up from a chair with arms (e.g., wheelchair, bedside commode, etc,.)?: Total Help needed moving to and from a bed to chair (including a wheelchair)?: A Little Help needed walking in hospital room?: A Little Help needed climbing 3-5 steps with a railing? : A Little 6 Click Score: 13    End of Session Equipment Utilized During Treatment: Gait belt Activity Tolerance: Patient tolerated treatment well Patient left: in bed;with call bell/phone within reach;with bed alarm set Nurse Communication: Mobility  status PT Visit Diagnosis: Other abnormalities of gait and mobility (R26.89);Pain Pain - part of body:  (abdomen )    Time: 4142-3953 PT Time Calculation (min) (ACUTE ONLY): 18 min   Charges:   PT Evaluation $PT Eval Low Complexity: 1 Procedure PT Treatments $Gait Training: 8-22 mins   PT G Codes:        Nicky Pugh, PT, DPT  Acute Rehabilitation Services  Pager: [phone]   Army Melia 10/18/2016, 12:22 PM

## 2016-10-18 NOTE — Progress Notes (Signed)
Patient's UO was 250 ccs of concentrated urine over the last 12 hours. He has orders for strict intake and output. Patient does have a foley catheter and a history of CKD, but has made adequate urine over a 12 hour shift during this admission before. I paged the on call to let them know, but have not heard back yet. Vital signs are stable. Will continue to monitor.

## 2016-10-18 NOTE — Care Management Note (Signed)
Case Management Note  Patient Details  Name: Makyi Ledo MRN: [id number] Date of Birth: [birth year]  Subjective/Objective:                    Action/Plan:  Await PT eval Expected Discharge Date:                  Expected Discharge Plan:  Home/Self Care  In-House Referral:     Discharge planning Services     Post Acute Care Choice:    Choice offered to:     DME Arranged:    DME Agency:     HH Arranged:    [place] Agency:     Status of Service:  In process, will continue to follow  If discussed at Long Length of Stay Meetings, dates discussed:    Additional Comments:  Marilu Favre, RN 10/18/2016, 11:05 AM

## 2016-10-18 NOTE — Plan of Care (Signed)
Problem: Safety: Goal: Ability to remain free from injury will improve Outcome: Progressing No incidence of falls during this admission. Call bell within reach. Patient alert and oriented. Bed alarm in place. Nonskid footwear being utilized. Clean and clear environment maintained. 3/4 siderails in place. Patient verbalized understanding of safety instruction.  Problem: Pain Management: Goal: General experience of comfort will improve Outcome: Progressing Pain being managed with any pain medications at this time. Patient complains of a soreness, but states it is "tolerable."

## 2016-10-19 ENCOUNTER — Inpatient Hospital Stay (HOSPITAL_COMMUNITY): Payer: Medicare Other

## 2016-10-19 LAB — CBC
HEMATOCRIT: 34.3 % — AB (ref 39.0–52.0)
Hemoglobin: 11.6 g/dL — ABNORMAL LOW (ref 13.0–17.0)
MCH: 33.5 pg (ref 26.0–34.0)
MCHC: 33.8 g/dL (ref 30.0–36.0)
MCV: 99.1 fL (ref 78.0–100.0)
PLATELETS: 137 10*3/uL — AB (ref 150–400)
RBC: 3.46 MIL/uL — AB (ref 4.22–5.81)
RDW: 13.7 % (ref 11.5–15.5)
WBC: 8.6 10*3/uL (ref 4.0–10.5)

## 2016-10-19 LAB — BASIC METABOLIC PANEL
Anion gap: 9 (ref 5–15)
BUN: 20 mg/dL (ref 6–20)
CO2: 24 mmol/L (ref 22–32)
CREATININE: 1.26 mg/dL — AB (ref 0.61–1.24)
Calcium: 8.5 mg/dL — ABNORMAL LOW (ref 8.9–10.3)
Chloride: 102 mmol/L (ref 101–111)
GFR calc Af Amer: >60 mL/min (ref >60)
GFR, EST NON AFRICAN AMERICAN: 54 mL/min — AB (ref >60)
GLUCOSE: 106 mg/dL — AB (ref 65–99)
POTASSIUM: 4.2 mmol/L (ref 3.5–5.1)
SODIUM: 135 mmol/L (ref 135–145)

## 2016-10-19 MED ORDER — PHENOL 1.4 % MT LIQD
2.0000 | OROMUCOSAL | Status: DC | PRN
  Administered 2016-10-19: 2 via OROMUCOSAL
  Filled 2016-10-19: qty 177

## 2016-10-19 MED ORDER — SODIUM CHLORIDE 0.45 % IV BOLUS
500.0000 mL | Freq: Once | INTRAVENOUS | Status: AC
  Administered 2016-10-19: 500 mL via INTRAVENOUS

## 2016-10-19 MED ORDER — GLYCERIN (LAXATIVE) 2.1 G RE SUPP
1.0000 | Freq: Once | RECTAL | Status: AC
  Administered 2016-10-19: 1 via RECTAL
  Filled 2016-10-19 (×2): qty 1

## 2016-10-19 NOTE — Progress Notes (Signed)
I have stopped PO meds  For now, NG placed earlier.

## 2016-10-19 NOTE — Progress Notes (Signed)
[facility] Consultation Progress Note  Patient: Donald Moreno ERX:[id number]   PCP: Katheren Shams, DO DOB: 12-07-1940   DOA: 10/16/2016   DOS: 10/19/2016   Date of Service: the patient was seen and examined on 10/19/2016 Primary service: Ccs, Md, MD   Subjective: Feeling better but no BM. Coming work with nurse to see if he can have a BM. No nausea or vomiting. Abdomen feels distended. Pain is still an issue. No fever no chills.  Brief hospital course: Pt. with PMH of Advance Liver cirrhosis S/P TIPS 5/29, malnutrition, recurrent ascites, umbilical hernia, a flutter; admitted on 10/16/2016, with complaint of abdominal pain, was found to have incarcerated hernia. S/P hernia repair with mesh. No evidence of ischemic bowel Currently further plan is to return of bowel function.  Assessment and Plan: 1. Incarcerated umbilical hernia Management per surgery. Probably has ileus per surgery, x-ray abdomen ordered. Back on clear liquid diet.  2. Advance alcoholic liver cirrhosis. Recurrent ascites. S/P TIPS 10/11/2016. Had 4 L of fluid drained in OR. S/P 25 g of albumin. Renal function was mildly worsened after the procedure but currently getting back to normal. No medications at home. We will continue to monitor.  3. Acute kidney injury. Mild worsening of renal function, given albumin as well as IV fluid bolus. Currently on half normal saline at 50 mL per hour. Since renal function is getting better I would continue it. If the patient does not get better and needs more volume ideally should be changed to normal saline at 50cc per hour for volume expansion.  4. Severe protein malnutrition. Dietary following. Continue nutritional supplementation. Currently back on clear liquid diet due to concern for ileus. We'll monitor.  5. History of a flutter. Stable. Not on any anticoagulation due to liver disease, coagulopathy, thrombocytopenia.  6. Essential hypertension. Blood pressure well  controlled, not on any medication. Monitor.  Bowel regimen: last BM prior to admission Diet: Clear liquid diet DVT Prophylaxis: subcutaneous Heparin  Family Communication: no family was present at bedside, at the time of interview.   Disposition: We will continue to follow the patient.   Recommendation on discharge: Follow-up with PCP in one week  Other Consultants: none Procedures: Hernia repair with mesh Antibiotics: Anti-infectives    None      Objective: Physical Exam: Vitals:   10/18/16 2016 10/18/16 2125 10/19/16 0500 10/19/16 0915  BP:  134/67 114/62   Pulse:  (!) 107 (!) 56   Resp:  18 18   Temp:  99.6 F (37.6 C) 98.4 F (36.9 C)   TempSrc:  Oral Oral   SpO2: 99% 95% 100% 96%  Weight:      Height:        Intake/Output Summary (Last 24 hours) at 10/19/16 1055 Last data filed at 10/19/16 9509  Gross per 24 hour  Intake             2025 ml  Output             1125 ml  Net              900 ml   Filed Weights   10/16/16 1012  Weight: 68 kg (150 lb)   General: Alert, Awake and Oriented to Time, Place and Person. Appear in mild distress, affect appropriate Eyes: PERRL, Conjunctiva normal ENT: Oral Mucosa clear moist. Neck: no JVD, no Abnormal Mass Or lumps Cardiovascular: S1 and S2 Present, no Murmur, Peripheral Pulses Present Respiratory: normal respiratory effort, Bilateral Air  entry equal and Decreased, no use of accessory muscle, Clear to Auscultation, no Crackles, no wheezes Abdomen: Bowel Sound present, Soft and mild tenderness,  Skin: no redness, no Rash, no induration Extremities: no Pedal edema, no calf tenderness Neurologic: Grossly no focal neuro deficit. Bilaterally Equal motor strength   Data Reviewed: CBC:  Recent Labs Lab 10/16/16 1201 10/17/16 0346 10/18/16 0412 10/19/16 0433  WBC 5.7 7.6 8.9 8.6  NEUTROABS 3.7  --   --   --   HGB 12.4* 11.1* 11.2* 11.6*  HCT 36.4* 34.5* 34.3* 34.3*  MCV 98.9 101.8* 101.2* 99.1  PLT 124*  130* 143* 322*   Basic Metabolic Panel:  Recent Labs Lab 10/16/16 1201 10/17/16 0346 10/18/16 0412 10/19/16 0433  NA 142 138 134* 135  K 4.2 3.8 4.2 4.2  CL 110 108 104 102  CO2 22 26 24 24   GLUCOSE 116* 87 121* 106*  BUN 16 16 20 20   CREATININE 1.11 1.04 1.41* 1.26*  CALCIUM 8.9 8.4* 8.1* 8.5*   Liver Function Tests:  Recent Labs Lab 10/17/16 0346  AST 39  ALT 27  ALKPHOS 72  BILITOT 0.8  PROT 5.2*  ALBUMIN 2.7*   No results for input(s): LIPASE, AMYLASE in the last 168 hours. No results for input(s): AMMONIA in the last 168 hours. Cardiac Enzymes: No results for input(s): CKTOTAL, CKMB, CKMBINDEX, TROPONINI in the last 168 hours. BNP (last 3 results) No results for input(s): BNP in the last 8760 hours. CBG: No results for input(s): GLUCAP in the last 168 hours. No results found for this or any previous visit (from the past 240 hour(s)).  Studies: No results found.   Scheduled Meds: . allopurinol  300 mg Oral Daily  . cholecalciferol  5,000 Units Oral Daily  . docusate sodium  100 mg Oral BID  . feeding supplement (ENSURE ENLIVE)  237 mL Oral BID BM  . ferrous sulfate  325 mg Oral BID WC  . heparin subcutaneous  5,000 Units Subcutaneous Q8H  . mometasone-formoterol  2 puff Inhalation BID  . montelukast  10 mg Oral QHS  . polyethylene glycol  17 g Oral Daily   Continuous Infusions: . sodium chloride 50 mL/hr at 10/19/16 0318   PRN Meds: albuterol, hydrALAZINE, morphine injection, ondansetron **OR** ondansetron (ZOFRAN) IV, oxyCODONE  Time spent: 30 minutes  Author: Berle Mull, MD [facility] Pager: [phone] 10/19/2016 10:55 AM  If 7PM-7AM, please contact night-coverage at [email or web address], password [facility]

## 2016-10-19 NOTE — Progress Notes (Signed)
Patient ID: Donald Moreno, male   DOB: 06/12/1940, 76 y.o.   MRN: [id number]  [facility] Surgery Progress Note  3 Days Post-Op  Subjective: CC- abdominal soreness and distension Patient states that he feels more distended today. He is still tolerating solid food but feels like he is burping stool. No BM since admission. He is passing flatus. No n/v.  Worked with PT yesterday, no PT follow-up recommended.  Objective: Vital signs in last 24 hours: Temp:  [98.4 F (36.9 C)-99.6 F (37.6 C)] 98.4 F (36.9 C) (06/06 0500) Pulse Rate:  [56-107] 56 (06/06 0500) Resp:  [17-18] 18 (06/06 0500) BP: (114-134)/(61-67) 114/62 (06/06 0500) SpO2:  [95 %-100 %] 100 % (06/06 0500) Last BM Date: 10/15/16  Intake/Output from previous day: 06/05 0701 - 06/06 0700 In: 1805 [P.O.:800; I.V.:1005] Out: 825 [Urine:825] Intake/Output this shift: No intake/output data recorded.  PE: Gen:  Alert, NAD, pleasant Card:  RRR, no M/G/R heard Pulm:  CTAB, no W/R/R, effort normal Abd: Soft, distended, NT, +BS, no HSM, no hernia, incisions C/D/I Ext:  No erythema, edema, or tenderness BUE/BLE  Lab Results:   Recent Labs  10/18/16 0412 10/19/16 0433  WBC 8.9 8.6  HGB 11.2* 11.6*  HCT 34.3* 34.3*  PLT 143* 137*   BMET  Recent Labs  10/18/16 0412 10/19/16 0433  NA 134* 135  K 4.2 4.2  CL 104 102  CO2 24 24  GLUCOSE 121* 106*  BUN 20 20  CREATININE 1.41* 1.26*  CALCIUM 8.1* 8.5*   PT/INR No results for input(s): LABPROT, INR in the last 72 hours. CMP     Component Value Date/Time   NA 135 10/19/2016 0433   K 4.2 10/19/2016 0433   CL 102 10/19/2016 0433   CO2 24 10/19/2016 0433   GLUCOSE 106 (H) 10/19/2016 0433   BUN 20 10/19/2016 0433   CREATININE 1.26 (H) 10/19/2016 0433   CREATININE 1.19 (H) 09/29/2016 1701   CALCIUM 8.5 (L) 10/19/2016 0433   PROT 5.2 (L) 10/17/2016 0346   ALBUMIN 2.7 (L) 10/17/2016 0346   AST 39 10/17/2016 0346   ALT 27 10/17/2016 0346   ALKPHOS 72  10/17/2016 0346   BILITOT 0.8 10/17/2016 0346   GFRNONAA 54 (L) 10/19/2016 0433   GFRNONAA 60 09/29/2016 1701   GFRAA >60 10/19/2016 0433   GFRAA 69 09/29/2016 1701   Lipase     Component Value Date/Time   LIPASE 29 06/19/2016 0247       Studies/Results: No results found.  Anti-infectives: Anti-infectives    None       Assessment/Plan Incarcerated UMBILICAL HERNIA S/p HERNIA REPAIR UMBILICAL ADULT INSERTION OF MESH6/3 Dr. Kieth Brightly - POD 3 - WBC WNL, afebrile  Oliguria - foley placed 6/4. UOP improving 401/02VO Advanced alcoholic liver cirrhosis- requires weekly paracentesis; s/p TIPS procedure about 1 week ago H/o Atrial flutter - now in NSR, not on anticoagulation at home HTN Severe malnutrition  ID - none FEN - clear liquid diet, Ensure BID VTE - SCDs, heparin  Plan - Patient will possible postop ileus. Check abdominal xray, will back off to clear liquids and give suppository. Continue ambulating. UOP improving, he had 825cc/24hr. Cr trending down 1.26 from 1.4. Will give another bolus of fluids today d/c foley.   Addendum: XR reviewed, shows distended loops of small bowel with free air. Free air likely postsurgical, patient does not have any signs of peritonitis and he does have bowel sounds. Will order NG/NPO for decompression. Continue ambulating.  LOS: 3 days    [name] , [facility] Surgery 10/19/2016, 7:29 AM Pager: [phone] Consults: [phone] Mon-Fri 7:00 am-4:30 pm Sat-Sun 7:00 am-11:30 am

## 2016-10-20 ENCOUNTER — Encounter (HOSPITAL_COMMUNITY): Payer: Self-pay | Admitting: General Surgery

## 2016-10-20 LAB — BASIC METABOLIC PANEL
ANION GAP: 9 (ref 5–15)
BUN: 19 mg/dL (ref 6–20)
CALCIUM: 8.4 mg/dL — AB (ref 8.9–10.3)
CO2: 24 mmol/L (ref 22–32)
CREATININE: 1.1 mg/dL (ref 0.61–1.24)
Chloride: 101 mmol/L (ref 101–111)
Glucose, Bld: 76 mg/dL (ref 65–99)
Potassium: 4.4 mmol/L (ref 3.5–5.1)
SODIUM: 134 mmol/L — AB (ref 135–145)

## 2016-10-20 LAB — MAGNESIUM: MAGNESIUM: 1.9 mg/dL (ref 1.7–2.4)

## 2016-10-20 MED ORDER — SODIUM CHLORIDE 0.9 % IV SOLN
INTRAVENOUS | Status: DC
  Administered 2016-10-20 – 2016-10-22 (×4): via INTRAVENOUS

## 2016-10-20 NOTE — Progress Notes (Signed)
4 Days Post-Op    CC:  Incarcerated umbilical hernia  Subjective: No real complaints from him.  Stomach still pretty distended. Few BS, high pitched, some flatus.  No nausea with NG in.    Objective: Vital signs in last 24 hours: Temp:  [98.3 F (36.8 C)-98.7 F (37.1 C)] 98.7 F (37.1 C) (06/06 2145) Pulse Rate:  [72-79] 79 (06/06 2145) Resp:  [17-18] 17 (06/06 2145) BP: (125)/(65-66) 125/66 (06/06 2145) SpO2:  [96 %-100 %] 99 % (06/06 2145) Last BM Date: 10/19/16 560 PO recorded 900 IV 1985 urine NG 925  - NG placed yesterday Afebrile, VSS sats 100% on R/a NA 134, K+ 4.4, Mag 1.9 1 view 10/19/16:  Successful placement of a esophagogastric tube with the tip in the region of the pylorus. Persistent distended loops of small bowel consistent with an ileus or distal obstruction.  Intake/Output from previous day: 06/06 0701 - 06/07 0700 In: 1468 [P.O.:560; I.V.:908] Out: 2910 [Urine:1985; Emesis/NG output:925] Intake/Output this shift: Total I/O In: -  Out: 200 [Urine:200]  General appearance: alert, cooperative and no distress Resp: clear to auscultation bilaterally GI: distended, few high pitched BS, wound is OK a little red.  some flatus.  Lab Results:   Recent Labs  10/18/16 0412 10/19/16 0433  WBC 8.9 8.6  HGB 11.2* 11.6*  HCT 34.3* 34.3*  PLT 143* 137*    BMET  Recent Labs  10/19/16 0433 10/20/16 0359  NA 135 134*  K 4.2 4.4  CL 102 101  CO2 24 24  GLUCOSE 106* 76  BUN 20 19  CREATININE 1.26* 1.10  CALCIUM 8.5* 8.4*   PT/INR No results for input(s): LABPROT, INR in the last 72 hours.   Recent Labs Lab 10/17/16 0346  AST 39  ALT 27  ALKPHOS 72  BILITOT 0.8  PROT 5.2*  ALBUMIN 2.7*     Lipase     Component Value Date/Time   LIPASE 29 06/19/2016 0247     Prior to Admission medications   Medication Sig Start Date End Date Taking? Authorizing Provider  ADVAIR DISKUS 500-50 MCG/DOSE AEPB INHALE 1 PUFF TWO TIMES  DAILY 09/16/16  Yes  Katheren Shams, DO  allopurinol (ZYLOPRIM) 300 MG tablet Take 1 tablet (300 mg total) by mouth daily. 09/16/16  Yes Luiz Blare Y, DO  Cholecalciferol (VITAMIN D-3) 5000 units TABS Take 1 tablet by mouth daily.   Yes [provider]  ferrous sulfate 325 (65 FE) MG tablet Take 1 tablet (325 mg total) by mouth 2 (two) times daily with a meal. Patient taking differently: Take 325 mg by mouth daily with breakfast.  10/21/14  Yes Gottschalk, Ashly M, DO  montelukast (SINGULAIR) 10 MG tablet Take 1 tablet (10 mg total) by mouth at bedtime. 09/23/15  Yes Katheren Shams, DO  PROAIR HFA 108 [phone] Base) MCG/ACT inhaler Use 2 puffs every 4 hours  as needed for wheezing or  shortness of breath 11/26/15  Yes Haney, Alyssa A, MD  Nutritional Supplements (ENSURE COMPLETE SHAKE) LIQD Take 1 Container by mouth daily. Patient not taking: Reported on 10/16/2016 03/04/15   Katheren Shams, DO    Medications: . allopurinol  300 mg Oral Daily  . heparin subcutaneous  5,000 Units Subcutaneous Q8H  . mometasone-formoterol  2 puff Inhalation BID  . montelukast  10 mg Oral QHS  . polyethylene glycol  17 g Oral Daily   . sodium chloride 50 mL/hr at 10/19/16 1414    Assessment/Plan Incarcerated umbilical  hernia Status post repair of umbilical hernia, insertion of mesh, 10/16/16 Dr. Gurney Maxin,  POD 4 Advanced alcoholic cirrhosis - requires weekly paracentesis/S/P TIPS procedure 10/11/16 Dr. Paulita Fujita Hypertension Severe Malnutrition Hx of AF Hx of CKD ID - none FEN - regular diet VTE - SCDs, heparin  Plan:  Continue NG today, sips of clears and ice chips today with NG in. Recheck labs and film in AM tomorrow.  Mobilize more. Increase fluids, daily weights.      LOS: 4 days    Macil Crady 10/20/2016 [phone]

## 2016-10-20 NOTE — Progress Notes (Signed)
Physical Therapy Treatment Patient Details Name: Donald Moreno MRN: [id number] DOB: 1940/12/06 Today's Date: 10/20/2016    History of Present Illness Pt is a 75 y/o male admitted secondary to incarcerated umbilical hernia. Pt is s/p hernia repair on 6/3. PMH includes HTN, a flutter, alcoholic cirrhosis of liver needing weekly paracentesis, alcohol abuse, SKD, asthma, s/p TIPS shunt on 5/29.     PT Comments    Progressing with distance with ambulation and able to negotiate obstacles in hallway.  Feel should be safe at home with family support and follow up HHPT at d/c.     Follow Up Recommendations  Home health PT;Supervision/Assistance - 24 hour     Equipment Recommendations  None recommended by PT    Recommendations for Other Services       Precautions / Restrictions Precautions Precautions: Fall    Mobility  Bed Mobility Overal bed mobility: Needs Assistance Bed Mobility: Supine to Sit;Sit to Supine     Supine to sit: HOB elevated;Min guard Sit to supine: Min assist   General bed mobility comments: increased time and effort lifting trunk upright; assist for trunk stability, to supine assist for leg over edge of bed  Transfers Overall transfer level: Needs assistance Equipment used: Rolling walker (2 wheeled) Transfers: Sit to/from Stand Sit to Stand: Min guard         General transfer comment: for safety, pulls up on walker (stood from toilet in bathroom unaided)  Ambulation/Gait Ambulation/Gait assistance: Min guard;Supervision Ambulation Distance (Feet): 400 Feet Assistive device: Rolling walker (2 wheeled) Gait Pattern/deviations: Step-through pattern;Trunk flexed;Decreased stride length     General Gait Details: cues for posture, able to maneuver around obstacles in hallway with minguard    Stairs            Wheelchair Mobility    Modified Rankin (Stroke Patients Only)       Balance Overall balance assessment: Needs  assistance Sitting-balance support: Feet supported Sitting balance-Leahy Scale: Good       Standing balance-Leahy Scale: Fair Standing balance comment: in bathroom toileted unaided in sitting and stood prior to PT returning                            Cognition Arousal/Alertness: Awake/alert Behavior During Therapy: WFL for tasks assessed/performed Overall Cognitive Status: Within Functional Limits for tasks assessed                                        Exercises      General Comments        Pertinent Vitals/Pain Pain Score: 4  Pain Location: Stomach Pain Descriptors / Indicators: Sore Pain Intervention(s): Monitored during session    Home Living                      Prior Function            PT Goals (current goals can now be found in the care plan section) Progress towards PT goals: Progressing toward goals    Frequency    Min 3X/week      PT Plan Current plan remains appropriate    Co-evaluation              AM-PAC PT "6 Clicks" Daily Activity  Outcome Measure  Difficulty turning over in bed (including adjusting bedclothes, sheets and blankets)?: A Lot  Difficulty moving from lying on back to sitting on the side of the bed? : Total Difficulty sitting down on and standing up from a chair with arms (e.g., wheelchair, bedside commode, etc,.)?: Total Help needed moving to and from a bed to chair (including a wheelchair)?: A Little Help needed walking in hospital room?: A Little Help needed climbing 3-5 steps with a railing? : A Little 6 Click Score: 13    End of Session Equipment Utilized During Treatment: Gait belt Activity Tolerance: Patient tolerated treatment well Patient left: in bed;with call bell/phone within reach   PT Visit Diagnosis: Other abnormalities of gait and mobility (R26.89);Pain Pain - part of body:  (abdomen)     Time: 1345-1400 PT Time Calculation (min) (ACUTE ONLY): 15  min  Charges:  $Gait Training: 8-22 mins                    G CodesMagda Kiel, [place] [phone] 10/20/2016    Reginia Naas 10/20/2016, 2:54 PM

## 2016-10-20 NOTE — Care Management Important Message (Signed)
Important Message  Patient Details  Name: Donald Moreno MRN: [id number] Date of Birth: February 17, 1941   Medicare Important Message Given:  Yes    Donald Moreno 10/20/2016, 12:03 PM

## 2016-10-20 NOTE — Progress Notes (Signed)
[facility] Consultation Progress Note  Patient: Donald Moreno UMP:[id number]   PCP: Katheren Shams, DO DOB: 1941-03-25   DOA: 10/16/2016   DOS: 10/20/2016   Date of Service: the patient was seen and examined on 10/20/2016 Primary service: Edison Pace, Md, MD   Subjective: X-ray yesterday showed ileus versus SBO. NG tube inserted. Tolerating well. Abdominal distention is about the same. Passing gas. No BM.  Brief hospital course: Pt. with PMH of Advance Liver cirrhosis S/P TIPS 5/29, malnutrition, recurrent ascites, umbilical hernia, a flutter; admitted on 10/16/2016, with complaint of abdominal pain, was found to have incarcerated hernia. S/P hernia repair with mesh. No evidence of ischemic bowel Currently further plan is to return of bowel function.  Assessment and Plan: 1. Incarcerated umbilical hernia  Postoperative ileus. Management per surgery. Currently nothing by mouth and NG tube inserted.  2. Advance alcoholic liver cirrhosis. Refractory ascites. S/P TIPS 10/11/2016. Had 4 L of fluid drained in OR. S/P 25 g of albumin. Renal function was mildly worsened after the procedure but currently getting back to normal. No medications at home. Prior to this the patient was getting paracentesis on a weekly basis. Based on that information Present dosage should be scheduled on 10/23/2016. Currently the stitches appear intact of the wound and abdomen although distended relatively soft. Continue to monitor clinically and decide on paracentesis prior to 10/23/2016. Patient will benefit from adding lactulose whenever appropriate from surgical point of view.  3. Acute kidney injury. Mild worsening of renal function, given albumin as well as IV fluid bolus. I will reduce normal saline from 75 mL's per hour to 50 mL's per hour since her renal function has normalized.  4. Severe protein malnutrition. Dietary following. Continue nutritional supplementation. Currently back on clear liquid diet due to  concern for ileus. We'll monitor.  5. History of a flutter. Stable. Not on any anticoagulation due to liver disease, coagulopathy, thrombocytopenia.  6. Essential hypertension. Blood pressure well controlled, not on any medication. Monitor.  Bowel regimen: last BM 10/18/2016 Diet: Nothing by mouth DVT Prophylaxis: subcutaneous Heparin  Family Communication: no family was present at bedside, at the time of interview.   Disposition: We will continue to follow the patient.   Recommendation on discharge: Follow-up with PCP in one week  Other Consultants: none Procedures: Hernia repair with mesh Antibiotics: Anti-infectives    None      Objective: Physical Exam: Vitals:   10/19/16 0915 10/19/16 1514 10/19/16 2145 10/20/16 1452  BP:  125/65 125/66 119/64  Pulse:  72 79 73  Resp:  18 17 18   Temp:  98.3 F (36.8 C) 98.7 F (37.1 C) 99.8 F (37.7 C)  TempSrc:  Oral Oral Oral  SpO2: 96% 100% 99% 100%  Weight:      Height:        Intake/Output Summary (Last 24 hours) at 10/20/16 1554 Last data filed at 10/20/16 1356  Gross per 24 hour  Intake             1228 ml  Output             2735 ml  Net            -1507 ml   Filed Weights   10/16/16 1012  Weight: 68 kg (150 lb)   General: Alert, Awake and Oriented to Time, Place and Person. Appear in mild distress, affect appropriate ENT: Oral Mucosa clear moist. Neck: no JVD, no Abnormal Mass Or lumps Cardiovascular: S1 and S2 Present,  no Murmur, Peripheral Pulses Present Respiratory: normal respiratory effort, Bilateral Air entry equal and Decreased, no use of accessory muscle, Clear to Auscultation, no Crackles, no wheezes Abdomen: Bowel Sound present, Soft and mild tenderness,  Skin: no redness, no Rash, no induration Extremities: no Pedal edema, no calf tenderness Neurologic: Grossly no focal neuro deficit. Bilaterally Equal motor strength   Data Reviewed: CBC:  Recent Labs Lab 10/16/16 1201 10/17/16 0346  10/18/16 0412 10/19/16 0433  WBC 5.7 7.6 8.9 8.6  NEUTROABS 3.7  --   --   --   HGB 12.4* 11.1* 11.2* 11.6*  HCT 36.4* 34.5* 34.3* 34.3*  MCV 98.9 101.8* 101.2* 99.1  PLT 124* 130* 143* 027*   Basic Metabolic Panel:  Recent Labs Lab 10/16/16 1201 10/17/16 0346 10/18/16 0412 10/19/16 0433 10/20/16 0359  NA 142 138 134* 135 134*  K 4.2 3.8 4.2 4.2 4.4  CL 110 108 104 102 101  CO2 22 26 24 24 24   GLUCOSE 116* 87 121* 106* 76  BUN 16 16 20 20 19   CREATININE 1.11 1.04 1.41* 1.26* 1.10  CALCIUM 8.9 8.4* 8.1* 8.5* 8.4*  MG  --   --   --   --  1.9   Liver Function Tests:  Recent Labs Lab 10/17/16 0346  AST 39  ALT 27  ALKPHOS 72  BILITOT 0.8  PROT 5.2*  ALBUMIN 2.7*   No results for input(s): LIPASE, AMYLASE in the last 168 hours. No results for input(s): AMMONIA in the last 168 hours. Cardiac Enzymes: No results for input(s): CKTOTAL, CKMB, CKMBINDEX, TROPONINI in the last 168 hours. BNP (last 3 results) No results for input(s): BNP in the last 8760 hours. CBG: No results for input(s): GLUCAP in the last 168 hours. No results found for this or any previous visit (from the past 240 hour(s)).  Studies: No results found.   Scheduled Meds: . allopurinol  300 mg Oral Daily  . heparin subcutaneous  5,000 Units Subcutaneous Q8H  . mometasone-formoterol  2 puff Inhalation BID  . montelukast  10 mg Oral QHS  . polyethylene glycol  17 g Oral Daily   Continuous Infusions: . sodium chloride 50 mL/hr at 10/20/16 0954   PRN Meds: albuterol, hydrALAZINE, morphine injection, ondansetron **OR** ondansetron (ZOFRAN) IV, phenol  Time spent: 30 minutes  Author: Berle Mull, MD [facility] Pager: [phone] 10/20/2016 3:54 PM  If 7PM-7AM, please contact night-coverage at [email or web address], password [facility]

## 2016-10-21 ENCOUNTER — Inpatient Hospital Stay (HOSPITAL_COMMUNITY): Payer: Medicare Other

## 2016-10-21 LAB — COMPREHENSIVE METABOLIC PANEL
ALBUMIN: 2.3 g/dL — AB (ref 3.5–5.0)
ALK PHOS: 66 U/L (ref 38–126)
ALT: 13 U/L — ABNORMAL LOW (ref 17–63)
ANION GAP: 7 (ref 5–15)
AST: 21 U/L (ref 15–41)
BUN: 16 mg/dL (ref 6–20)
CALCIUM: 8.3 mg/dL — AB (ref 8.9–10.3)
CO2: 24 mmol/L (ref 22–32)
Chloride: 105 mmol/L (ref 101–111)
Creatinine, Ser: 1.19 mg/dL (ref 0.61–1.24)
GFR calc Af Amer: >60 mL/min (ref >60)
GFR calc non Af Amer: 58 mL/min — ABNORMAL LOW (ref >60)
GLUCOSE: 79 mg/dL (ref 65–99)
Potassium: 4.1 mmol/L (ref 3.5–5.1)
Sodium: 136 mmol/L (ref 135–145)
Total Bilirubin: 1.6 mg/dL — ABNORMAL HIGH (ref 0.3–1.2)
Total Protein: 5.2 g/dL — ABNORMAL LOW (ref 6.5–8.1)

## 2016-10-21 LAB — CBC
HCT: 31.9 % — ABNORMAL LOW (ref 39.0–52.0)
HEMOGLOBIN: 10.7 g/dL — AB (ref 13.0–17.0)
MCH: 33.3 pg (ref 26.0–34.0)
MCHC: 33.5 g/dL (ref 30.0–36.0)
MCV: 99.4 fL (ref 78.0–100.0)
Platelets: 189 10*3/uL (ref 150–400)
RBC: 3.21 MIL/uL — ABNORMAL LOW (ref 4.22–5.81)
RDW: 13.6 % (ref 11.5–15.5)
WBC: 7.4 10*3/uL (ref 4.0–10.5)

## 2016-10-21 MED ORDER — PANTOPRAZOLE SODIUM 40 MG IV SOLR
40.0000 mg | INTRAVENOUS | Status: DC
  Administered 2016-10-21 – 2016-10-23 (×3): 40 mg via INTRAVENOUS
  Filled 2016-10-21 (×3): qty 40

## 2016-10-21 NOTE — Care Management Note (Signed)
Case Management Note  Patient Details  Name: Nic Lampe MRN: [id number] Date of Birth: Dec 31, 1940  Subjective/Objective:  From home with niece and nephews, who gives him support at home , Status post repair of umbilical hernia, insertion of mesh, 10/16/16 Dr. Jeananne Rama 5 Advanced alcoholic cirrhosis - requires weekly paracentesis/S/P TIPS procedure 10/11/16 , ng tube clamped on clears , ambulating, abd still distended. bs hypoactive.  Per pt eval rec HHPT, patient chose [facility] referral made to [facility] with [facility] for HHPT, soc will begin 24-48 hrs post dc.  PCP Luiz Blare           Action/Plan: NCM will follow for dc needs.  Expected Discharge Date:                  Expected Discharge Plan:  [place]  In-House Referral:     Discharge planning Services  CM Consult  Post Acute Care Choice:    Choice offered to:     DME Arranged:    DME Agency:     HH Arranged:  PT [place]:  [place]  Status of Service:  Completed, signed off  If discussed at [place] of Stay Meetings, dates discussed:    Additional Comments:  Zenon Mayo, RN 10/21/2016, 9:20 AM

## 2016-10-21 NOTE — Progress Notes (Signed)
5 Days Post-Op    CC:  Umbilical hernia  Subjective: Up in the chair with clear diet. NG has been clamped all day yesterday. He reports he is taking the clears but very uncomfortable with the NG in place. He's been ambulating. Abdomen is still distended, bowel sounds are hypoactive, no flatus or BM.  Objective: Vital signs in last 24 hours: Temp:  [99.5 F (37.5 C)-100 F (37.8 C)] 99.5 F (37.5 C) (06/08 0539) Pulse Rate:  [72-78] 72 (06/08 0539) Resp:  [17-18] 17 (06/08 0539) BP: (119-122)/(60-64) 122/60 (06/08 0539) SpO2:  [93 %-100 %] 100 % (06/08 0539) Weight:  [70.1 kg (154 lb 8.7 oz)] 70.1 kg (154 lb 8.7 oz) (06/08 0500) Last BM Date: 10/19/16 1040 PO Urine 2450 NG out TM 100, VSS labs OK Film yesterday:  Successful placement of a esophagogastric tube with the tip in the region of the pylorus. Persistent distended loops of small bowel consistent with an ileus or distal obstruction.  Intake/Output from previous day: 06/07 0701 - 06/08 0700 In: 2117.1 [P.O.:1040; I.V.:1077.1] Out: 2450 [Urine:2450] Intake/Output this shift: No intake/output data recorded.  General appearance: alert, cooperative and no distress Resp: clear to auscultation bilaterally GI: Soft, distended, bowel sounds are hypoactive. No flatus or BM.  Lab Results:   Recent Labs  10/19/16 0433 10/21/16 0542  WBC 8.6 7.4  HGB 11.6* 10.7*  HCT 34.3* 31.9*  PLT 137* 189    BMET  Recent Labs  10/20/16 0359 10/21/16 0542  NA 134* 136  K 4.4 4.1  CL 101 105  CO2 24 24  GLUCOSE 76 79  BUN 19 16  CREATININE 1.10 1.19  CALCIUM 8.4* 8.3*   PT/INR No results for input(s): LABPROT, INR in the last 72 hours.   Recent Labs Lab 10/17/16 0346 10/21/16 0542  AST 39 21  ALT 27 13*  ALKPHOS 72 66  BILITOT 0.8 1.6*  PROT 5.2* 5.2*  ALBUMIN 2.7* 2.3*     Lipase     Component Value Date/Time   LIPASE 29 06/19/2016 0247     Medications: . allopurinol  300 mg Oral Daily  . heparin  subcutaneous  5,000 Units Subcutaneous Q8H  . mometasone-formoterol  2 puff Inhalation BID  . montelukast  10 mg Oral QHS  . polyethylene glycol  17 g Oral Daily   . sodium chloride 50 mL/hr at 10/21/16 0424    Assessment/Plan Incarcerated umbilical hernia Status post repair of umbilical hernia, insertion of mesh, 10/16/16 Dr. Gurney Maxin,  POD 5 Advanced alcoholic cirrhosis - requires weekly paracentesis/S/P TIPS procedure 10/11/16 Dr. Paulita Fujita Hypertension Severe Malnutrition Hx of AF Hx of CKD ID - none FEN - IV fluids/clears VTE - SCDs, heparin  Plan:  Recheck film before pulling NG.  Ordered but not done yet.    LOS: 5 days    Kristine Tiley 10/21/2016 [phone]

## 2016-10-21 NOTE — Progress Notes (Addendum)
[facility] Consultation Progress Note  Patient: Donald Moreno KWI:[id number]   PCP: Katheren Shams, DO DOB: 17-Jan-1941   DOA: 10/16/2016   DOS: 10/21/2016   Date of Service: the patient was seen and examined on 10/21/2016 Primary service: Ccs, Md, MD   Subjective: On one hand the patient tells me that he is feeling better. But then he tells me that he has just vomited his clear liquid diet. He also tells me that his throat is hurting. No abdominal pain  Brief hospital course: Pt. with PMH of Advance Liver cirrhosis S/P TIPS 5/29, malnutrition, recurrent ascites, umbilical hernia, a flutter; admitted on 10/16/2016, with complaint of abdominal pain, was found to have incarcerated hernia. S/P hernia repair with mesh. No evidence of ischemic bowel Currently further plan is to await for return of bowel function.  Assessment and Plan: 1. Incarcerated umbilical hernia  Postoperative ileus. Suspected free air under diaphragm. Management per surgery. NG tube was inserted, and now remote. now on clear liquid diet.  2. Advance alcoholic liver cirrhosis. Refractory ascites. S/P TIPS 10/11/2016. Had 4 L of fluid drained in OR. S/P 25 g of albumin. Renal function was mildly worsened after the procedure but currently getting back to normal. No medications at home. Prior to this the patient was getting paracentesis on a weekly basis. Based on that information Present dosage should be scheduled on 10/23/2016. Currently the stitches appear intact of the wound and abdomen although distended relatively soft. Continue to monitor clinically and decide on paracentesis prior to 10/23/2016. Patient will benefit from adding lactulose whenever appropriate from surgical point of view.  3. Acute kidney injury. Mild worsening of renal function, given albumin as well as IV fluid bolus. Continue daily monitoring.  4. Severe protein malnutrition. Dietary following. Continue nutritional  supplementation. Currently back on clear liquid diet due to concern for ileus. We'll monitor.  5. History of a flutter. Stable. Not on any anticoagulation due to liver disease, coagulopathy, thrombocytopenia.  6. Essential hypertension. Blood pressure well controlled, not on any medication. Monitor.  Bowel regimen: last BM 10/18/2016 Diet: Clear liquid diet DVT Prophylaxis: subcutaneous Heparin  Family Communication: no family was present at bedside, at the time of interview.   Disposition: We will continue to follow the patient.   Recommendation on discharge: Follow-up with PCP in one week  Other Consultants: none Procedures: Hernia repair with mesh Antibiotics: Anti-infectives    None      Objective: Physical Exam: Vitals:   10/21/16 0500 10/21/16 0539 10/21/16 0921 10/21/16 1400  BP:  122/60  131/63  Pulse:  72  79  Resp:  17  18  Temp:  99.5 F (37.5 C)  98.9 F (37.2 C)  TempSrc:    Oral  SpO2:  100% 97% 98%  Weight: 70.1 kg (154 lb 8.7 oz)     Height:        Intake/Output Summary (Last 24 hours) at 10/21/16 1841 Last data filed at 10/21/16 1746  Gross per 24 hour  Intake          1977.08 ml  Output             1675 ml  Net           302.08 ml   Filed Weights   10/16/16 1012 10/21/16 0500  Weight: 68 kg (150 lb) 70.1 kg (154 lb 8.7 oz)   General: Appear in moderate distress, no Rash; Oral Mucosa moisty. no Abnormal Mass Or lumps Cardiovascular: S1 and  S2 Present, no Murmur, Respiratory: normal respiratory effort, Bilateral Air entry present and Clear to Auscultation, no Crackles, no wheezes Abdomen: Bowel Sound present, Soft and no tenderness, distended, mild ascites present clinically hydrocele present which is chronic. Extremities: no Pedal edema, no calf tenderness Neurology: Alert, Awake and Oriented to Time, Place and Person. affect appropriate. Grossly no focal neuro deficit.    Data Reviewed: CBC:  Recent Labs Lab 10/16/16 1201  10/17/16 0346 10/18/16 0412 10/19/16 0433 10/21/16 0542  WBC 5.7 7.6 8.9 8.6 7.4  NEUTROABS 3.7  --   --   --   --   HGB 12.4* 11.1* 11.2* 11.6* 10.7*  HCT 36.4* 34.5* 34.3* 34.3* 31.9*  MCV 98.9 101.8* 101.2* 99.1 99.4  PLT 124* 130* 143* 137* 433   Basic Metabolic Panel:  Recent Labs Lab 10/17/16 0346 10/18/16 0412 10/19/16 0433 10/20/16 0359 10/21/16 0542  NA 138 134* 135 134* 136  K 3.8 4.2 4.2 4.4 4.1  CL 108 104 102 101 105  CO2 26 24 24 24 24   GLUCOSE 87 121* 106* 76 79  BUN 16 20 20 19 16   CREATININE 1.04 1.41* 1.26* 1.10 1.19  CALCIUM 8.4* 8.1* 8.5* 8.4* 8.3*  MG  --   --   --  1.9  --    Liver Function Tests:  Recent Labs Lab 10/17/16 0346 10/21/16 0542  AST 39 21  ALT 27 13*  ALKPHOS 72 66  BILITOT 0.8 1.6*  PROT 5.2* 5.2*  ALBUMIN 2.7* 2.3*   No results for input(s): LIPASE, AMYLASE in the last 168 hours. No results for input(s): AMMONIA in the last 168 hours. Cardiac Enzymes: No results for input(s): CKTOTAL, CKMB, CKMBINDEX, TROPONINI in the last 168 hours. BNP (last 3 results) No results for input(s): BNP in the last 8760 hours. CBG: No results for input(s): GLUCAP in the last 168 hours. No results found for this or any previous visit (from the past 240 hour(s)).  Studies: Dg Abd 2 Views  Result Date: 10/21/2016 CLINICAL DATA:  Diffuse abdominal pain following nasogastric tube placement several days ago. Status post umbilical hernia repair on 10/16/2016. Vomited this morning. EXAM: ABDOMEN - 2 VIEW COMPARISON:  10/19/2016.  Abdomen and pelvis CT dated 06/19/2016. FINDINGS: Nasogastric tube tip in the region of the proximal second portion of the duodenum. A TIPS stent remains in place. Borderline to minimally dilated small bowel loops with improvement. Possible small amount of free peritoneal air. Thoracolumbar spine degenerative changes. Changes of avascular necrosis in both femoral heads with secondary degenerative changes IMPRESSION: 1. Possible  small amount of free peritoneal air. This may represent postoperative air. If the patient has clinical findings suspicious for bowel perforation, a left lateral decubitus view of the abdomen would be recommended. 2. Improving small bowel probable postoperative ileus. These results will be called to the ordering clinician or representative by the Radiologist Assistant, and communication documented in the PACS or zVision Dashboard. Electronically Signed   By: Claudie Revering M.D.   On: 10/21/2016 12:02     Scheduled Meds: . allopurinol  300 mg Oral Daily  . heparin subcutaneous  5,000 Units Subcutaneous Q8H  . mometasone-formoterol  2 puff Inhalation BID  . montelukast  10 mg Oral QHS  . pantoprazole (PROTONIX) IV  40 mg Intravenous Q24H  . polyethylene glycol  17 g Oral Daily   Continuous Infusions: . sodium chloride 50 mL/hr at 10/21/16 1442   PRN Meds: albuterol, hydrALAZINE, morphine injection, ondansetron **OR** ondansetron (ZOFRAN)  IV, phenol  Time spent: 30 minutes  Author: Berle Mull, MD [facility] Pager: [phone] 10/21/2016 6:41 PM  If 7PM-7AM, please contact night-coverage at [email or web address], password [facility]

## 2016-10-22 MED ORDER — ALBUMIN HUMAN 25 % IV SOLN
50.0000 g | Freq: Once | INTRAVENOUS | Status: AC
  Administered 2016-10-23: 50 g via INTRAVENOUS
  Filled 2016-10-22: qty 200

## 2016-10-22 NOTE — Progress Notes (Signed)
[facility] Consultation Progress Note  Patient: Donald Moreno QBH:[id number]   PCP: Katheren Shams, DO DOB: [birth year]   DOA: 10/16/2016   DOS: 10/22/2016   Date of Service: the patient was seen and examined on 10/22/2016 Primary service: Edison Pace, Md, MD   Subjective: Patient had a BM. Has been tolerating clear liquid diet. No nausea no vomiting. Abdomen appears soft to him. No pain in the stomach.  Brief hospital course: Pt. with PMH of Advance Liver cirrhosis S/P TIPS 5/29, malnutrition, recurrent ascites, umbilical hernia, a flutter; admitted on 10/16/2016, with complaint of abdominal pain, was found to have incarcerated hernia. S/P hernia repair with mesh. No evidence of ischemic bowel Currently further plan is to await for return of bowel function.  Assessment and Plan: 1. Incarcerated umbilical hernia  Postoperative ileus. Suspected free air under diaphragm. Management per surgery. NG tube was inserted, and now removed. now on clear liquid diet. Tolerating it as well as has a BM.  2. Advance alcoholic liver cirrhosis. Refractory ascites. S/P TIPS 10/11/2016. Had 4 L of fluid drained in OR. S/P 25 g of albumin. Renal function was mildly worsened after the procedure but currently getting back to normal. No medications at home. Prior to this the patient was getting paracentesis on a weekly basis. Currently the stitches appear intact of the wound and abdomen although distended relatively soft.  We'll schedule for his regular paracentesis tomorrow, give 50 g of IV albumin after paracentesis tomorrow Patient will benefit from adding lactulose whenever appropriate from surgical point of view.  3. Acute kidney injury. Mild worsening of renal function, given albumin as well as IV fluid bolus. Continue daily monitoring.  4. Severe protein malnutrition. Dietary following. Continue nutritional supplementation. Currently back on clear liquid diet due to concern for ileus. We'll  monitor.  5. History of a flutter. Stable. Not on any anticoagulation due to liver disease, coagulopathy, thrombocytopenia.  6. Essential hypertension. Blood pressure well controlled, not on any medication. Monitor.  Bowel regimen: last BM 10/22/2016 Diet: Clear liquid diet DVT Prophylaxis: subcutaneous Heparin  Family Communication: no family was present at bedside, at the time of interview.   Disposition: We will continue to follow the patient.   Recommendation on discharge: Follow-up with PCP in one week  Other Consultants: none Procedures: Hernia repair with mesh Antibiotics: Anti-infectives    None      Objective: Physical Exam: Vitals:   10/21/16 2102 10/22/16 0500 10/22/16 0610 10/22/16 0905  BP: 118/61  116/73   Pulse: 83  100   Resp: 17  18   Temp: 99.1 F (37.3 C)  98.7 F (37.1 C)   TempSrc: Oral  Oral   SpO2: 96%  99% 99%  Weight:  71.7 kg (158 lb 1.1 oz)    Height:        Intake/Output Summary (Last 24 hours) at 10/22/16 1106 Last data filed at 10/22/16 0905  Gross per 24 hour  Intake             2150 ml  Output             1810 ml  Net              340 ml   Filed Weights   10/16/16 1012 10/21/16 0500 10/22/16 0500  Weight: 68 kg (150 lb) 70.1 kg (154 lb 8.7 oz) 71.7 kg (158 lb 1.1 oz)   General: Appear in moderate distress, no Rash; Oral Mucosa moisty. no Abnormal Mass Or lumps  Cardiovascular: S1 and S2 Present, no Murmur, Respiratory: normal respiratory effort, Bilateral Air entry present and Clear to Auscultation, no Crackles, no wheezes Abdomen: Bowel Sound present, Soft and no tenderness, distended, mild ascites present clinically hydrocele present which is chronic. Extremities: no Pedal edema, no calf tenderness Neurology: Alert, Awake and Oriented to Time, Place and Person. affect appropriate. Grossly no focal neuro deficit.  Data Reviewed: CBC:  Recent Labs Lab 10/16/16 1201 10/17/16 0346 10/18/16 0412 10/19/16 0433  10/21/16 0542  WBC 5.7 7.6 8.9 8.6 7.4  NEUTROABS 3.7  --   --   --   --   HGB 12.4* 11.1* 11.2* 11.6* 10.7*  HCT 36.4* 34.5* 34.3* 34.3* 31.9*  MCV 98.9 101.8* 101.2* 99.1 99.4  PLT 124* 130* 143* 137* 662   Basic Metabolic Panel:  Recent Labs Lab 10/17/16 0346 10/18/16 0412 10/19/16 0433 10/20/16 0359 10/21/16 0542  NA 138 134* 135 134* 136  K 3.8 4.2 4.2 4.4 4.1  CL 108 104 102 101 105  CO2 26 24 24 24 24   GLUCOSE 87 121* 106* 76 79  BUN 16 20 20 19 16   CREATININE 1.04 1.41* 1.26* 1.10 1.19  CALCIUM 8.4* 8.1* 8.5* 8.4* 8.3*  MG  --   --   --  1.9  --    Liver Function Tests:  Recent Labs Lab 10/17/16 0346 10/21/16 0542  AST 39 21  ALT 27 13*  ALKPHOS 72 66  BILITOT 0.8 1.6*  PROT 5.2* 5.2*  ALBUMIN 2.7* 2.3*   Studies: Dg Abd 2 Views  Result Date: 10/21/2016 CLINICAL DATA:  Diffuse abdominal pain following nasogastric tube placement several days ago. Status post umbilical hernia repair on 10/16/2016. Vomited this morning. EXAM: ABDOMEN - 2 VIEW COMPARISON:  10/19/2016.  Abdomen and pelvis CT dated 06/19/2016. FINDINGS: Nasogastric tube tip in the region of the proximal second portion of the duodenum. A TIPS stent remains in place. Borderline to minimally dilated small bowel loops with improvement. Possible small amount of free peritoneal air. Thoracolumbar spine degenerative changes. Changes of avascular necrosis in both femoral heads with secondary degenerative changes IMPRESSION: 1. Possible small amount of free peritoneal air. This may represent postoperative air. If the patient has clinical findings suspicious for bowel perforation, a left lateral decubitus view of the abdomen would be recommended. 2. Improving small bowel probable postoperative ileus. These results will be called to the ordering clinician or representative by the Radiologist Assistant, and communication documented in the PACS or zVision Dashboard. Electronically Signed   By: Claudie Revering M.D.   On:  10/21/2016 12:02     Scheduled Meds: . allopurinol  300 mg Oral Daily  . heparin subcutaneous  5,000 Units Subcutaneous Q8H  . mometasone-formoterol  2 puff Inhalation BID  . montelukast  10 mg Oral QHS  . pantoprazole (PROTONIX) IV  40 mg Intravenous Q24H  . polyethylene glycol  17 g Oral Daily   Continuous Infusions: . sodium chloride 50 mL/hr at 10/22/16 0547  . [START ON 10/23/2016] albumin human     PRN Meds: albuterol, hydrALAZINE, morphine injection, ondansetron **OR** ondansetron (ZOFRAN) IV, phenol  Time spent: 30 minutes  Author: Berle Mull, MD [facility] Pager: [phone] 10/22/2016 11:06 AM  If 7PM-7AM, please contact night-coverage at [email or web address], password [facility]

## 2016-10-22 NOTE — Progress Notes (Signed)
6 Days Post-Op  Subjective: Feeling better.  Having bowel movements.  Tolerating clear liquid diet.  Not having any abdominal pain.  Objective: Vital signs in last 24 hours: Temp:  [98.7 F (37.1 C)-99.1 F (37.3 C)] 98.7 F (37.1 C) (06/09 0610) Pulse Rate:  [79-100] 100 (06/09 0610) Resp:  [17-18] 18 (06/09 0610) BP: (116-131)/(61-73) 116/73 (06/09 0610) SpO2:  [96 %-99 %] 99 % (06/09 0905) Weight:  [71.7 kg (158 lb 1.1 oz)] 71.7 kg (158 lb 1.1 oz) (06/09 0500) Last BM Date: 10/22/16  Intake/Output from previous day: 06/08 0701 - 06/09 0700 In: 2230 [P.O.:1080; I.V.:750] Out: 1560 [Urine:1560] Intake/Output this shift: Total I/O In: 360 [P.O.:360] Out: 375 [Urine:375]  General appearance: Alert.  Friendly.  No distress. GI: Distended with fluid wave.  Nontender.  Some bowel sounds present.  Umbilical incision clean and dry.  No leak.  Lab Results:  No results found for this or any previous visit (from the past 24 hour(s)).   Studies/Results: No results found.  Marland Kitchen allopurinol  300 mg Oral Daily  . heparin subcutaneous  5,000 Units Subcutaneous Q8H  . mometasone-formoterol  2 puff Inhalation BID  . montelukast  10 mg Oral QHS  . pantoprazole (PROTONIX) IV  40 mg Intravenous Q24H  . polyethylene glycol  17 g Oral Daily     Assessment/Plan: s/p Procedure(s): HERNIA REPAIR UMBILICAL ADULT INSERTION OF MESH    POD #6.  Repair umbilical hernia with mesh, Dr. Gurney Maxin. Seek to be healing without wound disruption or leak Ileus resolving Advance to full liquid diet  Advanced alcoholic cirrhosis.  Status post TIPS procedure 10/11/2016; Dr. Paulita Fujita Agree with paracentesis tomorrow Okay to use lactulose when appropriate medically  Hypertension Severe protein calorie malnutrition AKI History of atrial flutter, stable.   @PROBHOSP @  LOS: 6 days    Sian Joles M 10/22/2016  . .prob

## 2016-10-23 ENCOUNTER — Inpatient Hospital Stay (HOSPITAL_COMMUNITY): Payer: Medicare Other

## 2016-10-23 LAB — COMPREHENSIVE METABOLIC PANEL
ALT: 13 U/L — AB (ref 17–63)
AST: 26 U/L (ref 15–41)
Albumin: 2.3 g/dL — ABNORMAL LOW (ref 3.5–5.0)
Alkaline Phosphatase: 72 U/L (ref 38–126)
Anion gap: 7 (ref 5–15)
BILIRUBIN TOTAL: 1.3 mg/dL — AB (ref 0.3–1.2)
BUN: 10 mg/dL (ref 6–20)
CHLORIDE: 109 mmol/L (ref 101–111)
CO2: 21 mmol/L — ABNORMAL LOW (ref 22–32)
CREATININE: 1.18 mg/dL (ref 0.61–1.24)
Calcium: 8.2 mg/dL — ABNORMAL LOW (ref 8.9–10.3)
GFR, EST NON AFRICAN AMERICAN: 59 mL/min — AB (ref >60)
Glucose, Bld: 102 mg/dL — ABNORMAL HIGH (ref 65–99)
POTASSIUM: 3.8 mmol/L (ref 3.5–5.1)
Sodium: 137 mmol/L (ref 135–145)
TOTAL PROTEIN: 5.5 g/dL — AB (ref 6.5–8.1)

## 2016-10-23 LAB — CBC WITH DIFFERENTIAL/PLATELET
Basophils Absolute: 0.1 10*3/uL (ref 0.0–0.1)
Basophils Relative: 1 %
EOS PCT: 2 %
Eosinophils Absolute: 0.2 10*3/uL (ref 0.0–0.7)
HCT: 35.4 % — ABNORMAL LOW (ref 39.0–52.0)
Hemoglobin: 11.8 g/dL — ABNORMAL LOW (ref 13.0–17.0)
LYMPHS ABS: 1.7 10*3/uL (ref 0.7–4.0)
LYMPHS PCT: 21 %
MCH: 33.2 pg (ref 26.0–34.0)
MCHC: 33.3 g/dL (ref 30.0–36.0)
MCV: 99.7 fL (ref 78.0–100.0)
Monocytes Absolute: 0.7 10*3/uL (ref 0.1–1.0)
Monocytes Relative: 9 %
Neutro Abs: 5.1 10*3/uL (ref 1.7–7.7)
Neutrophils Relative %: 67 %
PLATELETS: 224 10*3/uL (ref 150–400)
RBC: 3.55 MIL/uL — ABNORMAL LOW (ref 4.22–5.81)
RDW: 13.4 % (ref 11.5–15.5)
WBC: 7.7 10*3/uL (ref 4.0–10.5)

## 2016-10-23 MED ORDER — LACTULOSE 10 GM/15ML PO SOLN: 10.0000 g | Freq: Two times a day (BID) | ORAL | Status: DC | PRN

## 2016-10-23 MED ORDER — LACTULOSE 10 GM/15ML PO SOLN: 10.0000 g | Freq: Two times a day (BID) | ORAL | 0 refills | Status: DC | PRN

## 2016-10-23 MED ORDER — HYDROCODONE-ACETAMINOPHEN 5-325 MG PO TABS: 1.0000 | ORAL_TABLET | Freq: Four times a day (QID) | ORAL | 0 refills | Status: DC | PRN

## 2016-10-23 MED ORDER — LIDOCAINE HCL 1 % IJ SOLN
INTRAMUSCULAR | Status: AC
  Filled 2016-10-23: qty 20

## 2016-10-23 NOTE — Progress Notes (Signed)
[facility] Consultation Progress Note  Patient: Donald Moreno ASN:[id number]   PCP: Katheren Shams, DO DOB: 1940/06/23   DOA: 10/16/2016   DOS: 10/23/2016   Date of Service: the patient was seen and examined on 10/23/2016 Primary service: Ccs, Md, MD   Subjective: pt has been having BM and tolerating soft diet for now. No other acute complains at present  Brief hospital course: Pt. with PMH of Advance Liver cirrhosis S/P TIPS 5/29, malnutrition, recurrent ascites, umbilical hernia, a flutter; admitted on 10/16/2016, with complaint of abdominal pain, was found to have incarcerated hernia. S/P hernia repair with mesh. No evidence of ischemic bowel Currently further plan is to monitor diet tolerance  Assessment and Plan: 1. Incarcerated umbilical hernia  Postoperative ileus. Suspected free air under diaphragm. Management per surgery. NG tube was inserted, and now removed. now on soft diet, Tolerating it as well as has a BM. Clinically getting better.   2. Advance alcoholic liver cirrhosis. Refractory ascites. S/P TIPS 10/11/2016. Had 4 L of fluid drained in OR. S/P 25 g of albumin. Renal function was mildly worsened after the procedure but currently getting back to normal. No medications at home. Prior to this the patient was getting paracentesis on a weekly basis. Currently the stitches appear intact of the wound and abdomen although distended relatively soft.  paracentesis today, give 50 g of IV albumin after paracentesis. If no complication from paracentesis, pt can be safely discharge from medical standpoint. adding lactulose PRN.   3. Acute kidney injury. Mild worsening of renal function, given albumin as well as IV fluid bolus.  4. Severe protein malnutrition. Dietary following. Continue nutritional supplementation. We'll monitor.  5. History of a flutter. Stable. Not on any anticoagulation due to liver disease, coagulopathy, thrombocytopenia.  6. Essential  hypertension. Blood pressure well controlled, not on any medication. Monitor.  Bowel regimen: last BM 10/22/2016 Diet: soft diet DVT Prophylaxis: subcutaneous Heparin  Family Communication: no family was present at bedside, at the time of interview.   Disposition: We will continue to follow the patient.   Recommendation on discharge: Follow-up with PCP in one week, arrange for paracentesis.  Med-reconciliation completed. Discharge instruction also placed.   Other Consultants: none Procedures: Hernia repair with mesh Antibiotics: Anti-infectives    None      Objective: Physical Exam: Vitals:   10/22/16 1840 10/22/16 2011 10/22/16 2045 10/23/16 0553  BP: 115/68  123/78 117/84  Pulse: (!) 110  (!) 110 (!) 116  Resp: 18  18 18   Temp: 98.9 F (37.2 C)  99.8 F (37.7 C) 98.2 F (36.8 C)  TempSrc: Oral  Oral Oral  SpO2: 100% 99% 100% 100%  Weight:    72.5 kg (159 lb 13.3 oz)  Height:        Intake/Output Summary (Last 24 hours) at 10/23/16 0911 Last data filed at 10/23/16 0600  Gross per 24 hour  Intake             1992 ml  Output              100 ml  Net             1892 ml   Filed Weights   10/21/16 0500 10/22/16 0500 10/23/16 0553  Weight: 70.1 kg (154 lb 8.7 oz) 71.7 kg (158 lb 1.1 oz) 72.5 kg (159 lb 13.3 oz)   General: Appear in moderate distress, no Rash; Oral Mucosa moisty. no Abnormal Mass Or lumps Cardiovascular: S1 and S2 Present,  no Murmur, Respiratory: normal respiratory effort, Bilateral Air entry present and Clear to Auscultation, no Crackles, no wheezes Abdomen: Bowel Sound present, Soft and no tenderness, distended, mild ascites present clinically hydrocele present which is chronic. Extremities: no Pedal edema, no calf tenderness Neurology: Alert, Awake and Oriented to Time, Place and Person. affect appropriate. Grossly no focal neuro deficit.  Data Reviewed: CBC:  Recent Labs Lab 10/16/16 1201 10/17/16 0346 10/18/16 0412 10/19/16 0433  10/21/16 0542 10/23/16 0725  WBC 5.7 7.6 8.9 8.6 7.4 7.7  NEUTROABS 3.7  --   --   --   --  5.1  HGB 12.4* 11.1* 11.2* 11.6* 10.7* 11.8*  HCT 36.4* 34.5* 34.3* 34.3* 31.9* 35.4*  MCV 98.9 101.8* 101.2* 99.1 99.4 99.7  PLT 124* 130* 143* 137* 189 208   Basic Metabolic Panel:  Recent Labs Lab 10/18/16 0412 10/19/16 0433 10/20/16 0359 10/21/16 0542 10/23/16 0725  NA 134* 135 134* 136 137  K 4.2 4.2 4.4 4.1 3.8  CL 104 102 101 105 109  CO2 24 24 24 24  21*  GLUCOSE 121* 106* 76 79 102*  BUN 20 20 19 16 10   CREATININE 1.41* 1.26* 1.10 1.19 1.18  CALCIUM 8.1* 8.5* 8.4* 8.3* 8.2*  MG  --   --  1.9  --   --    Liver Function Tests:  Recent Labs Lab 10/17/16 0346 10/21/16 0542 10/23/16 0725  AST 39 21 26  ALT 27 13* 13*  ALKPHOS 72 66 72  BILITOT 0.8 1.6* 1.3*  PROT 5.2* 5.2* 5.5*  ALBUMIN 2.7* 2.3* 2.3*   Studies: No results found.   Scheduled Meds: . allopurinol  300 mg Oral Daily  . heparin subcutaneous  5,000 Units Subcutaneous Q8H  . mometasone-formoterol  2 puff Inhalation BID  . montelukast  10 mg Oral QHS  . pantoprazole (PROTONIX) IV  40 mg Intravenous Q24H  . polyethylene glycol  17 g Oral Daily   Continuous Infusions: . albumin human     PRN Meds: albuterol, hydrALAZINE, morphine injection, ondansetron **OR** ondansetron (ZOFRAN) IV, phenol  Time spent: 30 minutes  Author: Berle Mull, MD [facility] Pager: [phone] 10/23/2016 9:11 AM  If 7PM-7AM, please contact night-coverage at [email or web address], password [facility]

## 2016-10-23 NOTE — Procedures (Signed)
PROCEDURE SUMMARY:  Successful US guided paracentesis from right lateral abdomen.  Yielded 3.7 liters of amber fluid.  No immediate complications.  Pt tolerated well.   Specimen was not sent for labs.  Docia Barrier PA-C 10/23/2016 12:57 PM

## 2016-10-23 NOTE — Discharge Instructions (Signed)
Follow these instructions at home:  Take medicines only as directed by your health care provider. Do not use drugs that are toxic to your liver. Ask your health care provider before taking any new medicines, including over-the-counter medicines.  Rest as needed.  Eat a well-balanced diet. Ask your health care provider or dietitian for more information.  You may have to follow a low-salt diet or restrict your water intake as directed.  Do not drink alcohol. This is especially important if you are taking acetaminophen.  Keep all follow-up visits as directed by your health care provider. This is important.  Keep track of your weight. To do this, weigh yourself at the same time every day and record your weight.  Keep track of how much you drink and any changes in the amount you urinate.  Follow any instructions that your health care provider gives you about how much to drink.  Try not to eat salty (high-sodium) foods.  Take medicines only as directed by your health care provider.  Keep all follow-up visits as directed by your health care provider. This is important.  Report any changes in your health to your health care provider, especially if you develop new symptoms or your symptoms get worse  Contact a health care provider if:  You have fatigue or weakness that is getting worse.  You develop swelling of the hands, feet, legs, or face.  You have a fever.  You develop loss of appetite.  You have nausea or vomiting.  You develop jaundice.  You develop easy bruising or bleeding. Get help right away if:  You vomit bright red blood or a material that looks like coffee grounds.  You have blood in your stools.  Your stools appear black and tarry.  You become confused.  You have chest pain or trouble breathing. This information is not intended to replace advice given to you by your health care provider. Make sure you discuss any questions you have with your health care  provider. Document Released: 05/02/2005 Document Revised: 09/10/2015 Document Reviewed: 01/08/2014 Elsevier Interactive Patient Education  [employer].

## 2016-10-23 NOTE — Progress Notes (Signed)
Spoke w Dr Renelda Loma, he will place orders for [facility], referral made to [facility].

## 2016-10-23 NOTE — Discharge Summary (Signed)
Patient ID: Donald Moreno [id number] 76 y.o. 1940/06/10  Admit date: 10/16/2016  Discharge date and time: 10/23/2016  Admitting Physician: Gurney Maxin  Discharge Physician: Adin Hector  Admission Diagnoses: Umbilical hernia, incarcerated [K42.0]  Discharge Diagnoses: Incarcerated umbilical hernia                                         Advanced alcoholic cirrhosis with ascites                                         Status post TIPS procedure 10/11/2016                                         Hypertension                                         Severe protein calorie malnutrition                                          History of atrial fibrillation                                          History of CK D  Operations: Procedure(s): HERNIA REPAIR UMBILICAL ADULT INSERTION OF MESH  Admission Condition: fair  Discharged Condition: fair  Indication for Admission: 76 yo male with liver disease requiring weekly pericentesis presents with one day of abdominal pain and a large painful hernia. HE has had pain in the area frequently but not this severe. He denies nausea or vomiting. He denies fevers. He has been evaluated for hernia before but due to cirrhosis been told not to undergo repair.  Hospital Course: On the day of admission the patient was evaluated in the emergency department by Dr. Kieth Brightly.  It is found to have an incarcerated umbilical hernia with a small defect but a 5 cm diameter hernia sac.  No skin changes or necrosis noted.  He was taken to the operating room promptly and underwent repair of his incarcerated umbilical hernia with 8 cm diameter ventral X mesh disc as an underlay.     Postoperatively he suffered no major complications.  He had an ileus for several days but ultimately has ileus resolved and his diet was advanced to regular diet, he had enjoyed his food and was having bowel movements.  He had no wound complications or ascitic leak.     He was seen in  consultation by [facility] hospitalist who assisted in management of his cirrhosis, ascites, malnutrition, hypertension.  He was noted to be in normal sinus rhythm and did not need anticoagulation residual for flutter at this time.  DVT prophylaxis using heparin subcutaneous was added.     The medicine service scheduled him for his weekly paracentesis on Sunday, June 10.  The plan was to proceed with that paracentesis and then discharge him home in the afternoon after the procedure.  He was given instructions in diet and activities.  He was given a prescription for Norco for pain.  He was asked to return to see Dr. Kieth Brightly in one week for suture removal.      Consults: [facility] hospitalists  Significant Diagnostic Studies: Imaging studies.  Lab work.  Treatments: Surgery to repair umbilical hernia with mesh.  Paracentesis.  Disposition: Home  Patient Instructions:  Allergies as of 10/23/2016   No Known Allergies     Medication List    TAKE these medications   ADVAIR DISKUS 500-50 MCG/DOSE Aepb Generic drug:  Fluticasone-Salmeterol INHALE 1 PUFF TWO TIMES  DAILY   allopurinol 300 MG tablet Commonly known as:  ZYLOPRIM Take 1 tablet (300 mg total) by mouth daily.   ENSURE COMPLETE SHAKE Liqd Take 1 Container by mouth daily.   ferrous sulfate 325 (65 FE) MG tablet Take 1 tablet (325 mg total) by mouth 2 (two) times daily with a meal. What changed:  when to take this   HYDROcodone-acetaminophen 5-325 MG tablet Commonly known as:  NORCO Take 1-2 tablets by mouth every 6 (six) hours as needed for moderate pain or severe pain.   lactulose 10 GM/15ML solution Commonly known as:  CHRONULAC Take 15 mLs (10 g total) by mouth 2 (two) times daily as needed (need to ensure 2 soft BM in a day.).   montelukast 10 MG tablet Commonly known as:  SINGULAIR Take 1 tablet (10 mg total) by mouth at bedtime.   PROAIR HFA 108 (90 Base) MCG/ACT inhaler Generic drug:  albuterol Use 2 puffs every  4 hours  as needed for wheezing or  shortness of breath   Vitamin D-3 5000 units Tabs Take 1 tablet by mouth daily.       Activity: no heavy lifting for 8 weeks Diet: low fat, low cholesterol diet Wound Care: none needed  Follow-up:  With Dr. Gurney Maxin in 1 week.  Signed: Edsel Petrin. Dalbert Batman, M.D., FACS General and minimally invasive surgery Breast and Colorectal Surgery   I logged onto the [facility] website and reviewed his prescription medication history  10/23/2016, 9:26 AM

## 2016-10-23 NOTE — Progress Notes (Signed)
7 Days Post-Op  Subjective: Alert and stable.  No distress. Says he enjoyed the full liquids without nausea or vomiting Reports 3 bowel movements Pain well controlled Planning paracentesis today per medical service  Objective: Vital signs in last 24 hours: Temp:  [98.2 F (36.8 C)-99.8 F (37.7 C)] 98.2 F (36.8 C) (06/10 0553) Pulse Rate:  [110-116] 116 (06/10 0553) Resp:  [18] 18 (06/10 0553) BP: (113-123)/(68-84) 117/84 (06/10 0553) SpO2:  [99 %-100 %] 100 % (06/10 0553) Weight:  [72.5 kg (159 lb 13.3 oz)] 72.5 kg (159 lb 13.3 oz) (06/10 0553) Last BM Date: 10/22/16  Intake/Output from previous day: 06/09 0701 - 06/10 0700 In: 1870 [P.O.:720; I.V.:1150] Out: 475 [Urine:475] Intake/Output this shift: Total I/O In: 350 [I.V.:350] Out: -     EXAM:  General appearance: Alert.  Friendly.  No distress. GI: Distended with fluid wave.  Nontender.  Some bowel sounds present.  Umbilical incision clean and dry.  No leak.   Lab Results:  No results found for this or any previous visit (from the past 24 hour(s)).   Studies/Results: No results found.  Marland Kitchen allopurinol  300 mg Oral Daily  . heparin subcutaneous  5,000 Units Subcutaneous Q8H  . mometasone-formoterol  2 puff Inhalation BID  . montelukast  10 mg Oral QHS  . pantoprazole (PROTONIX) IV  40 mg Intravenous Q24H  . polyethylene glycol  17 g Oral Daily     Assessment/Plan: s/p Procedure(s): HERNIA REPAIR UMBILICAL ADULT INSERTION OF MESH  POD #7.  Repair umbilical hernia with mesh, Dr. Gurney Maxin. Seek to be healing without wound disruption or leak Ileus resolving Advance to soft diet Considering his ascites and abdominal distention, I would live leave the skin sutures in another 7 days  Advanced alcoholic cirrhosis.  Status post TIPS procedure 10/11/2016; Dr. Paulita Fujita Agree with paracentesis tomorrow Okay to use lactulose when appropriate medically  Hypertension Severe protein calorie  malnutrition AKI History of atrial flutter, stable.   @PROBHOSP @  LOS: 7 days    Michalene Debruler M 10/23/2016  . .prob

## 2016-10-23 NOTE — Progress Notes (Signed)
[name] to be D/C'd  per MD order. Discussed with the patient and all questions fully answered.  VSS, Skin clean, dry and intact without evidence of skin break down, no evidence of skin tears noted.  IV catheter discontinued intact. Site without signs and symptoms of complications. Dressing and pressure applied.  An After Visit Summary was printed and given to the patient. Patient received prescription.  D/c education completed with patient/family including follow up instructions, medication list, d/c activities limitations if indicated, with other d/c instructions as indicated by MD - patient able to verbalize understanding, all questions fully answered.   Patient instructed to return to ED, call 911, or call MD for any changes in condition.   Patient to be escorted via [place], and D/C home via private auto.

## 2016-10-24 ENCOUNTER — Other Ambulatory Visit (HOSPITAL_COMMUNITY): Payer: Self-pay | Admitting: Interventional Radiology

## 2016-10-24 DIAGNOSIS — K7031 Alcoholic cirrhosis of liver with ascites: Secondary | ICD-10-CM

## 2016-10-25 DIAGNOSIS — Z48815 Encounter for surgical aftercare following surgery on the digestive system: Secondary | ICD-10-CM | POA: Diagnosis not present

## 2016-10-25 DIAGNOSIS — J45909 Unspecified asthma, uncomplicated: Secondary | ICD-10-CM | POA: Diagnosis not present

## 2016-10-25 DIAGNOSIS — E43 Unspecified severe protein-calorie malnutrition: Secondary | ICD-10-CM | POA: Diagnosis not present

## 2016-10-25 DIAGNOSIS — Z7951 Long term (current) use of inhaled steroids: Secondary | ICD-10-CM | POA: Diagnosis not present

## 2016-10-25 DIAGNOSIS — I129 Hypertensive chronic kidney disease with stage 1 through stage 4 chronic kidney disease, or unspecified chronic kidney disease: Secondary | ICD-10-CM | POA: Diagnosis not present

## 2016-10-25 DIAGNOSIS — N189 Chronic kidney disease, unspecified: Secondary | ICD-10-CM | POA: Diagnosis not present

## 2016-10-25 DIAGNOSIS — M150 Primary generalized (osteo)arthritis: Secondary | ICD-10-CM | POA: Diagnosis not present

## 2016-10-25 DIAGNOSIS — I4891 Unspecified atrial fibrillation: Secondary | ICD-10-CM | POA: Diagnosis not present

## 2016-10-26 DIAGNOSIS — R188 Other ascites: Secondary | ICD-10-CM | POA: Diagnosis not present

## 2016-10-26 DIAGNOSIS — K7460 Unspecified cirrhosis of liver: Secondary | ICD-10-CM | POA: Diagnosis not present

## 2016-10-26 DIAGNOSIS — I8500 Esophageal varices without bleeding: Secondary | ICD-10-CM | POA: Diagnosis not present

## 2016-10-28 ENCOUNTER — Encounter: Payer: Self-pay | Admitting: Interventional Radiology

## 2016-10-28 ENCOUNTER — Other Ambulatory Visit (INDEPENDENT_AMBULATORY_CARE_PROVIDER_SITE_OTHER): Payer: Self-pay | Admitting: Physician Assistant

## 2016-10-28 ENCOUNTER — Other Ambulatory Visit: Payer: Self-pay | Admitting: *Deleted

## 2016-10-28 DIAGNOSIS — I129 Hypertensive chronic kidney disease with stage 1 through stage 4 chronic kidney disease, or unspecified chronic kidney disease: Secondary | ICD-10-CM | POA: Diagnosis not present

## 2016-10-28 DIAGNOSIS — Z7951 Long term (current) use of inhaled steroids: Secondary | ICD-10-CM | POA: Diagnosis not present

## 2016-10-28 DIAGNOSIS — M150 Primary generalized (osteo)arthritis: Secondary | ICD-10-CM | POA: Diagnosis not present

## 2016-10-28 DIAGNOSIS — I4891 Unspecified atrial fibrillation: Secondary | ICD-10-CM | POA: Diagnosis not present

## 2016-10-28 DIAGNOSIS — J45909 Unspecified asthma, uncomplicated: Secondary | ICD-10-CM | POA: Diagnosis not present

## 2016-10-28 DIAGNOSIS — E43 Unspecified severe protein-calorie malnutrition: Secondary | ICD-10-CM | POA: Diagnosis not present

## 2016-10-28 DIAGNOSIS — N189 Chronic kidney disease, unspecified: Secondary | ICD-10-CM | POA: Diagnosis not present

## 2016-10-28 DIAGNOSIS — Z48815 Encounter for surgical aftercare following surgery on the digestive system: Secondary | ICD-10-CM | POA: Diagnosis not present

## 2016-10-28 NOTE — Patient Outreach (Signed)
[facility]) Care Management  10/28/2016  Donald Moreno 01-Aug-1940 [id number]   Transition of Care Referral  Referral Date: 10/27/16 Referral Source: [facility] High Risk Date of Discharge: 10/23/16 Facility: [facility] Discharge Diagnosis: Incarcerated Umbilical Hernia Insurance: [facility] Medicare  Outreach attempt #1 to patient. No answer. RN CM left HIPAA compliant message along with contact info.     Plan: RNCM will contact patient within 3 three business days.    Lake Bells, RN, BSN, MHA/MSL, [place] Telephonic Care Manager Coordinator [facility] Direct Phone: [phone] Toll Free: [phone] Fax: [phone]

## 2016-11-01 DIAGNOSIS — N2581 Secondary hyperparathyroidism of renal origin: Secondary | ICD-10-CM | POA: Diagnosis not present

## 2016-11-01 DIAGNOSIS — I129 Hypertensive chronic kidney disease with stage 1 through stage 4 chronic kidney disease, or unspecified chronic kidney disease: Secondary | ICD-10-CM | POA: Diagnosis not present

## 2016-11-01 DIAGNOSIS — N183 Chronic kidney disease, stage 3 (moderate): Secondary | ICD-10-CM | POA: Diagnosis not present

## 2016-11-01 DIAGNOSIS — D631 Anemia in chronic kidney disease: Secondary | ICD-10-CM | POA: Diagnosis not present

## 2016-11-02 ENCOUNTER — Other Ambulatory Visit: Payer: Self-pay | Admitting: *Deleted

## 2016-11-02 DIAGNOSIS — Z48815 Encounter for surgical aftercare following surgery on the digestive system: Secondary | ICD-10-CM | POA: Diagnosis not present

## 2016-11-02 DIAGNOSIS — N189 Chronic kidney disease, unspecified: Secondary | ICD-10-CM | POA: Diagnosis not present

## 2016-11-02 DIAGNOSIS — J45909 Unspecified asthma, uncomplicated: Secondary | ICD-10-CM | POA: Diagnosis not present

## 2016-11-02 DIAGNOSIS — I129 Hypertensive chronic kidney disease with stage 1 through stage 4 chronic kidney disease, or unspecified chronic kidney disease: Secondary | ICD-10-CM | POA: Diagnosis not present

## 2016-11-02 DIAGNOSIS — I4891 Unspecified atrial fibrillation: Secondary | ICD-10-CM | POA: Diagnosis not present

## 2016-11-02 DIAGNOSIS — M150 Primary generalized (osteo)arthritis: Secondary | ICD-10-CM | POA: Diagnosis not present

## 2016-11-02 DIAGNOSIS — E43 Unspecified severe protein-calorie malnutrition: Secondary | ICD-10-CM | POA: Diagnosis not present

## 2016-11-02 DIAGNOSIS — Z7951 Long term (current) use of inhaled steroids: Secondary | ICD-10-CM | POA: Diagnosis not present

## 2016-11-04 ENCOUNTER — Ambulatory Visit (INDEPENDENT_AMBULATORY_CARE_PROVIDER_SITE_OTHER): Payer: Medicare Other | Admitting: Obstetrics and Gynecology

## 2016-11-04 ENCOUNTER — Encounter: Payer: Self-pay | Admitting: *Deleted

## 2016-11-04 ENCOUNTER — Encounter: Payer: Self-pay | Admitting: Obstetrics and Gynecology

## 2016-11-04 VITALS — BP 118/78 | HR 118 | Temp 98.2°F | Wt 145.0 lb

## 2016-11-04 DIAGNOSIS — I129 Hypertensive chronic kidney disease with stage 1 through stage 4 chronic kidney disease, or unspecified chronic kidney disease: Secondary | ICD-10-CM | POA: Diagnosis not present

## 2016-11-04 DIAGNOSIS — K7031 Alcoholic cirrhosis of liver with ascites: Secondary | ICD-10-CM | POA: Diagnosis not present

## 2016-11-04 DIAGNOSIS — R600 Localized edema: Secondary | ICD-10-CM | POA: Diagnosis not present

## 2016-11-04 DIAGNOSIS — E43 Unspecified severe protein-calorie malnutrition: Secondary | ICD-10-CM | POA: Diagnosis not present

## 2016-11-04 DIAGNOSIS — J45909 Unspecified asthma, uncomplicated: Secondary | ICD-10-CM | POA: Diagnosis not present

## 2016-11-04 DIAGNOSIS — M150 Primary generalized (osteo)arthritis: Secondary | ICD-10-CM | POA: Diagnosis not present

## 2016-11-04 DIAGNOSIS — I4891 Unspecified atrial fibrillation: Secondary | ICD-10-CM | POA: Diagnosis not present

## 2016-11-04 DIAGNOSIS — Z09 Encounter for follow-up examination after completed treatment for conditions other than malignant neoplasm: Secondary | ICD-10-CM | POA: Diagnosis not present

## 2016-11-04 DIAGNOSIS — N189 Chronic kidney disease, unspecified: Secondary | ICD-10-CM | POA: Diagnosis not present

## 2016-11-04 DIAGNOSIS — Z7951 Long term (current) use of inhaled steroids: Secondary | ICD-10-CM | POA: Diagnosis not present

## 2016-11-04 DIAGNOSIS — K420 Umbilical hernia with obstruction, without gangrene: Secondary | ICD-10-CM | POA: Diagnosis not present

## 2016-11-04 DIAGNOSIS — Z48815 Encounter for surgical aftercare following surgery on the digestive system: Secondary | ICD-10-CM | POA: Diagnosis not present

## 2016-11-04 NOTE — Patient Instructions (Signed)
Keep legs elevated. Go get compression stockings and wear Call me with name of fluid medication Healing well from surgery keep follow-ups  [place] [phone]  [phone]  [facility] [address]  [phone]   Edema Edema is when you have too much fluid in your body or under your skin. Edema may make your legs, feet, and ankles swell up. Swelling is also common in looser tissues, like around your eyes. This is a common condition. It gets more common as you get older. There are many possible causes of edema. Eating too much salt (sodium) and being on your feet or sitting for a long time can cause edema in your legs, feet, and ankles. Hot weather may make edema worse. Edema is usually painless. Your skin may look swollen or shiny. Follow these instructions at home:  Keep the swollen body part raised (elevated) above the level of your heart when you are sitting or lying down.  Do not sit still or stand for a long time.  Do not wear tight clothes. Do not wear garters on your upper legs.  Exercise your legs. This can help the swelling go down.  Wear elastic bandages or support stockings as told by your doctor.  Eat a low-salt (low-sodium) diet to reduce fluid as told by your doctor.  Depending on the cause of your swelling, you may need to limit how much fluid you drink (fluid restriction).  Take over-the-counter and prescription medicines only as told by your doctor. Contact a doctor if:  Treatment is not working.  You have heart, liver, or kidney disease and have symptoms of edema.  You have sudden and unexplained weight gain. Get help right away if:  You have shortness of breath or chest pain.  You cannot breathe when you lie down.  You have pain, redness, or warmth in the swollen areas.  You have heart, liver, or kidney disease and get edema all of a sudden.  You have a fever and your symptoms get  worse all of a sudden. Summary  Edema is when you have too much fluid in your body or under your skin.  Edema may make your legs, feet, and ankles swell up. Swelling is also common in looser tissues, like around your eyes.  Raise (elevate) the swollen body part above the level of your heart when you are sitting or lying down.  Follow your doctor's instructions about diet and how much fluid you can drink (fluid restriction). This information is not intended to replace advice given to you by your health care provider. Make sure you discuss any questions you have with your health care provider. Document Released: 10/19/2007 Document Revised: 05/20/2016 Document Reviewed: 05/20/2016 Elsevier Interactive Patient Education  2017 [employer].

## 2016-11-04 NOTE — Progress Notes (Signed)
     Subjective: Chief Complaint  Patient presents with  . Hospitalization Follow-up    HPI: Donald Moreno is a 76 y.o. presenting to clinic today for hospital follow-up. Patient was admitted from 10/16/16 to 10/23/16 for incarcerated hernia. Surgery performed to repair with mesh. Patient is doing well pos-op. Denies any pain or complications. Has follow-up with surgeon later on. Appetite and bowel movements normal. Denies abdominal pain.   Patient also recently undergone TIPS procedure for  cirrhosis with ascites. Previously was getting weekly paracentesis but since procedure has not needed. Follows with GI. Denies abdominal distention. States however that now he is getting leg swelling. Was put on fluid pills but does not know the name of them.   No other concerns voiced.    ROS noted in HPI.   Past Medical, Surgical, Social, and Family History Reviewed & Updated per EMR.   Pertinent Historical Findings include: cirrhosis, HTN, CKD, asthma   History  Smoking Status  . Never Smoker  Smokeless Tobacco  . Current User  . Types: Snuff, Chew    Objective: BP 118/78   Pulse (!) 118   Temp 98.2 F (36.8 C) (Oral)   Wt 145 lb (65.8 kg)   SpO2 99%   BMI 20.51 kg/m  Vitals and nursing notes reviewed  Physical Exam General: NAD, pleasant Cardiovascular: RRR, S1 and S2 Present, no Murmur, Respiratory: normal respiratory effort, Bilateral Air entry present and Clear to Auscultation, no Crackles, no wheezes Abdomen: Bowel Sound present, Soft and no tenderness, distended, mild ascites, suture wound intact and healing well.   Extremities: Pedal edema, no calf tenderness Neurology: A&O. affect appropriate. Grossly no focal neuro deficit.  Assessment/Plan: Please see problem based Assessment and Plan PATIENT EDUCATION PROVIDED: See AVS    Diagnosis and plan along with any newly prescribed medication(s) were discussed in detail with this patient today. The patient verbalized  understanding and agreed with the plan. Patient advised if symptoms worsen return to clinic or ER.    Orders Placed This Encounter  Procedures  . Compression stockings    Compression stockings bilaterally Pressure: 30 mmHg    Luiz Blare, DO 11/04/2016, 8:52 AM PGY-3, [place]

## 2016-11-04 NOTE — Patient Outreach (Addendum)
[facility]) Care Management  11/02/16  Donald Moreno Mar 10, 1941 [id number]  Telephone Screen  Referral Date: 10/27/16 Referral Source: [facility] High Risk Referral Reason: Discharged from [facility] on 10/23/16 Insurance: [facility] Medicare  Outreach attempt # 2 spoke with patient regarding discharge from [place] verified with patient.   Social:  Patient lives with his nephew. He is independent with his ADLs. Patient's nephew/family provides transportation to his medical appointments. Patient reported [facility] services ended after 2 visits.  Conditions: Past Medical Hx: CHF, UTI, HTN, CAD, A/P Angioplasty, Afib Patient reported, he is excellent health since being discharged from the hospital. He reported, he was able to read and understand his discharge instructions provided by the hospital. Patient's stated, his surgical incision is healing. Patient educated on signs and symptoms of an infection and when to notify the doctor.  Medications: Patient reported taking 4 meds per day. Patient reported being able to afford his medications and taking them as prescribed. Patient had no questions or concerns about his meds.   Appointments: Patient reported, he has an upcoming appointment with his PCP on 11/04/16.  Advanced Directives: Patient reported having a Living Will and an HPOA. On 11/04/16, he reported not having an AD to his PCP's office staff.   Consent:  [facility] services reviewed and discussed with patient. Verbal consent was not given for [facility] services.  Plan: RN CM will notify [facility] CM administrative assistant regarding case closure. RN CM will send MD case closure letter.  Lake Bells, RN, BSN, MHA/MSL, [place] Telephonic Care Manager Coordinator [facility] Direct Phone: [phone] Toll Free: [phone] Fax: [phone]

## 2016-11-06 DIAGNOSIS — R600 Localized edema: Secondary | ICD-10-CM | POA: Insufficient documentation

## 2016-11-06 NOTE — Assessment & Plan Note (Signed)
New-onset. Most likely secondary to TIPS procedure. Unsure of diuretic patient is taking. Have to be careful with this in setting of CKD. Patient to call back to clinic with name of medication he is taking. In the meantime discussed elevation and compression. Rx given for compression stockings.

## 2016-11-06 NOTE — Assessment & Plan Note (Signed)
Chronic ascites with mild fluid collection on exam today. S/p recent TIPS procedure. Patient not needing paracentesis since then. Follows with GI. Will continue to monitor.

## 2016-11-06 NOTE — Assessment & Plan Note (Signed)
Recent hospitalization due to incarcerated umbilical hernia. S/p repair with mesh on 10/16/16. Patient doing well post-op. No concerns. Wound healing normally.

## 2016-11-08 ENCOUNTER — Telehealth: Payer: Self-pay | Admitting: *Deleted

## 2016-11-08 DIAGNOSIS — J45909 Unspecified asthma, uncomplicated: Secondary | ICD-10-CM | POA: Diagnosis not present

## 2016-11-08 DIAGNOSIS — Z48815 Encounter for surgical aftercare following surgery on the digestive system: Secondary | ICD-10-CM | POA: Diagnosis not present

## 2016-11-08 DIAGNOSIS — N189 Chronic kidney disease, unspecified: Secondary | ICD-10-CM | POA: Diagnosis not present

## 2016-11-08 DIAGNOSIS — I129 Hypertensive chronic kidney disease with stage 1 through stage 4 chronic kidney disease, or unspecified chronic kidney disease: Secondary | ICD-10-CM | POA: Diagnosis not present

## 2016-11-08 DIAGNOSIS — I4891 Unspecified atrial fibrillation: Secondary | ICD-10-CM | POA: Diagnosis not present

## 2016-11-08 DIAGNOSIS — E43 Unspecified severe protein-calorie malnutrition: Secondary | ICD-10-CM | POA: Diagnosis not present

## 2016-11-08 DIAGNOSIS — M150 Primary generalized (osteo)arthritis: Secondary | ICD-10-CM | POA: Diagnosis not present

## 2016-11-08 DIAGNOSIS — Z7951 Long term (current) use of inhaled steroids: Secondary | ICD-10-CM | POA: Diagnosis not present

## 2016-11-08 NOTE — Telephone Encounter (Signed)
Elray Mcgregor, RN with [facility] called stating patient will be discharge today for home care services.  She also reported that patient's heart rate at rest was 121, asymptomic. Pulse was not pounding or threading per Natalia Pt is walking and eating fine.  Advised her to tell patient to call for a follow up appointment with new PCP soon.  Please give her a call at [phone]. Derl Barrow, RN

## 2016-12-21 ENCOUNTER — Ambulatory Visit
Admission: RE | Admit: 2016-12-21 | Discharge: 2016-12-21 | Disposition: A | Discharge status: Home / Self Care | Payer: Medicare Other | Source: Ambulatory Visit | Attending: Radiology | Admitting: Radiology

## 2016-12-21 ENCOUNTER — Ambulatory Visit
Admission: RE | Admit: 2016-12-21 | Discharge: 2016-12-21 | Disposition: A | Discharge status: Home / Self Care | Payer: Medicare Other | Source: Ambulatory Visit | Attending: Interventional Radiology | Admitting: Interventional Radiology

## 2016-12-21 DIAGNOSIS — Z9889 Other specified postprocedural states: Secondary | ICD-10-CM | POA: Diagnosis not present

## 2016-12-21 DIAGNOSIS — K7031 Alcoholic cirrhosis of liver with ascites: Secondary | ICD-10-CM

## 2016-12-21 DIAGNOSIS — K7460 Unspecified cirrhosis of liver: Secondary | ICD-10-CM | POA: Diagnosis not present

## 2016-12-21 HISTORY — PX: IR RADIOLOGIST EVAL & MGMT: IMG5224

## 2016-12-21 NOTE — Progress Notes (Signed)
Patient ID: Donald Moreno, male   DOB: March 27, 1941, 76 y.o.   MRN: [id number]        Chief Complaint: Patient was seen in consultation today for  Chief Complaint  Patient presents with  . Follow-up    follow up TIPS (10/11/2016)   at the request of Turpin,Pamela  Referring Physician(s): Turpin,Pamela  History of Present Illness: Donald Moreno is a 76 y.o. male known to our service from [place] creation 10/11/2016.  He has cirrhosis and portal venous hypertension with recurrent large-volume abdominal ascites. He had previously had multiple visits for ultrasound-guided paracentesis,  18 times this calendar year. Medical history also significant for renal insufficiency and atrial fibrillation. He has refused liver transplant evaluation.   TIPS creation 10/11/2016 without technical complication. He did well and was able to go home the next day. He did present to the ED with incarcerated umbilical hernia on 86/76/1948, requiring urgent hernia repair which he tolerated well. He did undergo a paracentesis on 10/23/2016  due to some residual/recurrent ascites. He's not needed paracentesis since. His stomach does not feel distended. He ambulates easily. He notes that he has run out of his lactulose.  Past Medical History:  Diagnosis Date  . Arthritis    "all over"  . Asthma   . Chronic kidney disease    "I see a kidney dr @ [facility]" (10/11/2016)  . Chronic lower back pain    "turned a truck over a long time ago" (10/11/2016)  . ETOH abuse   . Gout   . Hypertension   . Liver cirrhosis ([place]) 2015  . Pneumonia    "long long time ago" (10/11/2016)  . Spontaneous bacterial peritonitis ([place]) 07/01/2015    Past Surgical History:  Procedure Laterality Date  . INSERTION OF MESH N/A 10/16/2016   Procedure: INSERTION OF MESH;  Surgeon: Kinsinger, Arta Bruce, MD;  Location: [facility];  Service: General;  Laterality: N/A;  . IR PARACENTESIS  08/19/2016  . IR PARACENTESIS  08/26/2016  . IR PARACENTESIS   09/02/2016  . IR PARACENTESIS  09/09/2016  . IR PARACENTESIS  09/16/2016  . IR PARACENTESIS  10/03/2016  . IR PARACENTESIS  10/11/2016  . IR RADIOLOGIST EVAL & MGMT  09/29/2016  . IR TIPS  10/11/2016  . RADIOLOGY WITH ANESTHESIA N/A 10/11/2016   Procedure: TIPS;  Surgeon: Arne Cleveland, MD;  Location: [facility];  Service: Radiology;  Laterality: N/A;  . UMBILICAL HERNIA REPAIR N/A 10/16/2016   Procedure: HERNIA REPAIR UMBILICAL ADULT;  Surgeon: Kinsinger, Arta Bruce, MD;  Location: [facility];  Service: General;  Laterality: N/A;    Allergies: Patient has no known allergies.  Medications: Prior to Admission medications   Medication Sig Start Date End Date Taking? Authorizing Provider  ADVAIR DISKUS 500-50 MCG/DOSE AEPB INHALE 1 PUFF TWO TIMES  DAILY 09/16/16  Yes Katheren Shams, DO  allopurinol (ZYLOPRIM) 300 MG tablet Take 1 tablet (300 mg total) by mouth daily. 09/16/16  Yes Luiz Blare Y, DO  Cholecalciferol (VITAMIN D-3) 5000 units TABS Take 1 tablet by mouth daily.   Yes [provider]  ferrous sulfate 325 (65 FE) MG tablet Take 1 tablet (325 mg total) by mouth 2 (two) times daily with a meal. Patient taking differently: Take 325 mg by mouth daily with breakfast.  10/21/14  Yes Gottschalk, Ashly M, DO  montelukast (SINGULAIR) 10 MG tablet Take 1 tablet (10 mg total) by mouth at bedtime. 09/23/15  Yes Katheren Shams, DO  Nutritional Supplements (ENSURE  COMPLETE SHAKE) LIQD Take 1 Container by mouth daily. 03/04/15  Yes Katheren Shams, DO  PROAIR HFA 108 [phone] Base) MCG/ACT inhaler Use 2 puffs every 4 hours  as needed for wheezing or  shortness of breath 11/26/15  Yes Haney, Alyssa A, MD  HYDROcodone-acetaminophen (NORCO) 5-325 MG tablet Take 1-2 tablets by mouth every 6 (six) hours as needed for moderate pain or severe pain. Patient not taking: Reported on 12/21/2016 10/23/16   Fanny Skates, MD  lactulose (CHRONULAC) 10 GM/15ML solution Take 15 mLs (10 g total) by mouth 2 (two) times daily as  needed (need to ensure 2 soft BM in a day.). Patient not taking: Reported on 12/21/2016 10/23/16   Lavina Hamman, MD     Family History  Problem Relation Age of Onset  . Other Mother        died when pt was only 17 - unknown cause.  . Other Father        deceased.  . Asthma Sister   . Heart disease Sister        multiple stents.  . Asthma Brother   . Hypertension Brother   . Asthma Son   . Asthma Brother   . Hypertension Brother     Social History   Social History  . Marital status: Divorced    Spouse name: N/A  . Number of children: 5  . Years of education: 10   Occupational History  . Retired- Administrator    Social History Main Topics  . Smoking status: Never Smoker  . Smokeless tobacco: Current User    Types: Snuff, Chew  . Alcohol use No     Comment: quit drinking "over 2 yr ago" (10/11/2016)  . Drug use: No  . Sexual activity: Not Currently   Other Topics Concern  . Not on file   Social History Narrative   Lives with nephew and niece in [place] home.     Tobacco: snuff all day (can last two days. Never smoked.       Hobbies: cooking, sleeping, going to store   Pets:  Dog, Bean    ECOG Status: 1 - Symptomatic but completely ambulatory  Review of Systems: A 12 point ROS discussed and pertinent positives are indicated in the HPI above.  All other systems are negative.  Review of Systems  Vital Signs: BP (!) 144/91   Pulse (!) 132   Temp 98 F (36.7 C) (Oral)   Resp 17   Ht 5\' 9"  (1.753 m)   Wt 150 lb (68 kg)   SpO2 100%   BMI 22.15 kg/m   Physical Exam Constitutional: Oriented to person, place, and time. Well-developed and well-nourished. No distress.  HENT:  Head: Normocephalic and atraumatic.  Eyes: Conjunctivae and EOM are normal. Right eye exhibits no discharge. Left eye exhibits no discharge. No scleral icterus.  Neck: No JVD present.  Pulmonary/Chest: Effort normal. No stridor. No respiratory distress.  Abdomen: soft, non distended.  No umbilical hernia. Neurological:  alert and oriented to person, place, and time.  Skin: Skin is warm and dry.  not diaphoretic.  Psychiatric:   normal mood and affect.   behavior is normal. Judgment and thought content normal.   Mallampati Score:     Imaging: EXAM: DUPLEX ULTRASOUND OF LIVER AND TIPS SHUNT  TECHNIQUE: Color and duplex Doppler ultrasound was performed to evaluate the hepatic in-flow and out-flow vessels.  COMPARISON:  Preop 10/03/2016  FINDINGS: TIPS Shunt Velocities  Proximal:  107 cm/sec  Mid:  160  Distal:  160   cm/sec  Portal Vein Velocities (all  hepatopetal)  Main:  41 Cm/sec  Right:  34 cm/sec  Left:  30 cm/sec  Hepatic Vein Velocities (all hepatofugal)  Right:  82 cm/sec  Middle:  26 cm/sec  Left:  78 cm/sec  Hepatic Artery Velocity 145 cm/second  Splenic Vein Velocity:  50 cm/second  Varices:  None identified  Ascites: Present, perihepatic  Other findings: 1.9 cm simple cyst, right hepatic lobe. Cholelithiasis. Gallbladder wall thickening up to 5 mm.  IMPRESSION: 1. Widely patent TIPS with continued hepatopetal portal venous flow. 2. Small amount of residual/recurrent perihepatic ascites.   Electronically Signed   By: Lucrezia Europe M.D.   On: 12/21/2016 11:31  Labs:  CBC:  Recent Labs  10/18/16 0412 10/19/16 0433 10/21/16 0542 10/23/16 0725  WBC 8.9 8.6 7.4 7.7  HGB 11.2* 11.6* 10.7* 11.8*  HCT 34.3* 34.3* 31.9* 35.4*  PLT 143* 137* 189 224    COAGS:  Recent Labs  06/20/16 0503 10/11/16 0702  INR 1.25 1.25  APTT  --  34    BMP:  Recent Labs  10/19/16 0433 10/20/16 0359 10/21/16 0542 10/23/16 0725  NA 135 134* 136 137  K 4.2 4.4 4.1 3.8  CL 102 101 105 109  CO2 24 24 24  21*  GLUCOSE 106* 76 79 102*  BUN 20 19 16 10   CALCIUM 8.5* 8.4* 8.3* 8.2*  CREATININE 1.26* 1.10 1.19 1.18  GFRNONAA 54* >60 58* 59*  GFRAA >60 >60 >60 >60    LIVER FUNCTION TESTS:  Recent  Labs  10/11/16 0702 10/17/16 0346 10/21/16 0542 10/23/16 0725  BILITOT 0.7 0.8 1.6* 1.3*  AST 34 39 21 26  ALT 14* 27 13* 13*  ALKPHOS 71 72 66 72  PROT 6.3* 5.2* 5.2* 5.5*  ALBUMIN 3.3* 2.7* 2.3* 2.3*    TUMOR MARKERS: No results for input(s): AFPTM, CEA, CA199, CHROMGRNA in the last 8760 hours.  Assessment and Plan:  My impression is that this patient has done very well post TIPS creation. He's only required 1 paracentesis post procedure, a significant improvement compared to his weekly large volume paracentesis requirements preprocedure. Stomach is nondistended.  We again note tachycardia on exam although the patient appears asymptomatic. This was described by RN on a previous examination in June as well. I recommended that he set up an appointment with his family practitioner for further evaluation.  His ultrasound looks good post TIPS. I do recommend  he follow-up with Dr. Erlinda Hong office to maintain his by mouth lactulose prescription.  We did discuss the long-term prognosis of tips shunt. We did discuss the low likelihood of TIPS restenosis or occlusion. Plan on yearly ultrasound surveillance. He knows to telephone should he have any significant recurrent symptomatic ascites, or any TIPS related questions or problems.  Thank you for this interesting consult.  I greatly enjoyed meeting Donald Moreno and look forward to participating in their care.  A copy of this report was sent to the requesting provider on this date.  We will plan to see him back in one year.  Electronically Signed: Adaira Centola III, DAYNE Wendell Nicoson 12/21/2016, 11:32 AM   I spent a total of    25 Minutes in face to face in clinical consultation, greater than 50% of which was counseling/coordinating care for cirrhosis, post TIPS creation.

## 2016-12-23 ENCOUNTER — Other Ambulatory Visit (HOSPITAL_COMMUNITY): Payer: Self-pay | Admitting: Gastroenterology

## 2016-12-23 DIAGNOSIS — R188 Other ascites: Secondary | ICD-10-CM

## 2016-12-23 DIAGNOSIS — K7460 Unspecified cirrhosis of liver: Secondary | ICD-10-CM

## 2016-12-26 DIAGNOSIS — R188 Other ascites: Secondary | ICD-10-CM | POA: Diagnosis not present

## 2016-12-26 DIAGNOSIS — K7460 Unspecified cirrhosis of liver: Secondary | ICD-10-CM | POA: Diagnosis not present

## 2016-12-27 ENCOUNTER — Other Ambulatory Visit (HOSPITAL_COMMUNITY): Payer: Self-pay | Admitting: Gastroenterology

## 2016-12-27 DIAGNOSIS — R1084 Generalized abdominal pain: Secondary | ICD-10-CM

## 2016-12-27 DIAGNOSIS — R188 Other ascites: Secondary | ICD-10-CM

## 2016-12-27 DIAGNOSIS — Z95828 Presence of other vascular implants and grafts: Secondary | ICD-10-CM

## 2016-12-27 DIAGNOSIS — K7460 Unspecified cirrhosis of liver: Secondary | ICD-10-CM

## 2016-12-28 DIAGNOSIS — R1030 Lower abdominal pain, unspecified: Secondary | ICD-10-CM | POA: Diagnosis not present

## 2016-12-29 ENCOUNTER — Encounter: Payer: Self-pay | Admitting: Cardiovascular Disease

## 2016-12-29 ENCOUNTER — Ambulatory Visit (INDEPENDENT_AMBULATORY_CARE_PROVIDER_SITE_OTHER): Payer: Medicare Other | Admitting: Cardiovascular Disease

## 2016-12-29 VITALS — BP 104/76 | HR 128 | Ht 70.0 in | Wt 160.0 lb

## 2016-12-29 DIAGNOSIS — N183 Chronic kidney disease, stage 3 unspecified: Secondary | ICD-10-CM

## 2016-12-29 DIAGNOSIS — I483 Typical atrial flutter: Secondary | ICD-10-CM | POA: Diagnosis not present

## 2016-12-29 DIAGNOSIS — K7031 Alcoholic cirrhosis of liver with ascites: Secondary | ICD-10-CM | POA: Diagnosis not present

## 2016-12-29 DIAGNOSIS — K4090 Unilateral inguinal hernia, without obstruction or gangrene, not specified as recurrent: Secondary | ICD-10-CM

## 2016-12-29 MED ORDER — BISOPROLOL FUMARATE 5 MG PO TABS: ORAL_TABLET | ORAL | 0 refills | Status: DC

## 2016-12-29 MED ORDER — BISOPROLOL FUMARATE 5 MG PO TABS
5.0000 mg | ORAL_TABLET | Freq: Every day | ORAL | 3 refills | Status: DC
Start: 2016-12-29 — End: 2017-01-11

## 2016-12-29 NOTE — Progress Notes (Signed)
Patient ID: Donald Moreno, male   DOB: 03-18-1941, 76 y.o.   MRN: [id number]    Cardiology Office Note    Date:  12/29/2016   ID:  Donald Moreno, DOB 17-Dec-1940, MRN [id number]  PCP:  Shirley, [place], DO  Cardiologist:   Sanda Klein, MD   Chief Complaint  Patient presents with  . Follow-up    no chest pain, no shortness of breath, edema-in groin and legs,no  pain or cramping in legs, no lightheaded ir dizziness    History of Present Illness:  Donald Moreno is a 76 y.o. male with advanced liver cirrhosis with portal HTN and ascites, parenchymal insufficiency with spontaneously elevated prothrombin time, atrial flutter with RVR. He was treated with amiodarone for rate control and developed junctional bradycardia so the medication was stopped. On June 3 he was hospitalized with an incarcerated umbilical hernia and underwent urgent surgery without complications. He has also undergone a TIPS procedure and no longer requires periodic paracentesis. He has a large right inguinal hernia and plans to undergo surgery for this.  He continues to have mild (class II) exertional dyspnea but no other cardiovascular complaints. He is unaware of palpitations even though today he is in atrial flutter with 2:1 AV block and a ventricular rate of 128 bpm. He has never had problems with metabolic encephalopathy or any focal neurological events or active bleeding. He mostly complains about pain in his right groin where he has a large inguinal hernia. He does not have angina, leg edema, syncope, claudication or other cardiovascular complaints.  He has occasional wheezing. He uses  a long-acting bronchodilator daily, occasionally uses rescue inhalers   Past Medical History:  Diagnosis Date  . Arthritis    "all over"  . Asthma   . Chronic kidney disease    "I see a kidney dr @ [facility]" (10/11/2016)  . Chronic lower back pain    "turned a truck over a long time ago" (10/11/2016)  . ETOH abuse   . Gout     . Hypertension   . Liver cirrhosis ([place]) 2015  . Pneumonia    "long long time ago" (10/11/2016)  . Spontaneous bacterial peritonitis ([place]) 07/01/2015    Past Surgical History:  Procedure Laterality Date  . INSERTION OF MESH N/A 10/16/2016   Procedure: INSERTION OF MESH;  Surgeon: Kinsinger, Arta Bruce, MD;  Location: [facility];  Service: General;  Laterality: N/A;  . IR PARACENTESIS  08/19/2016  . IR PARACENTESIS  08/26/2016  . IR PARACENTESIS  09/02/2016  . IR PARACENTESIS  09/09/2016  . IR PARACENTESIS  09/16/2016  . IR PARACENTESIS  10/03/2016  . IR PARACENTESIS  10/11/2016  . IR RADIOLOGIST EVAL & MGMT  09/29/2016  . IR TIPS  10/11/2016  . RADIOLOGY WITH ANESTHESIA N/A 10/11/2016   Procedure: TIPS;  Surgeon: Arne Cleveland, MD;  Location: [facility];  Service: Radiology;  Laterality: N/A;  . UMBILICAL HERNIA REPAIR N/A 10/16/2016   Procedure: HERNIA REPAIR UMBILICAL ADULT;  Surgeon: Kinsinger, Arta Bruce, MD;  Location: [facility];  Service: General;  Laterality: N/A;    Outpatient Medications Prior to Visit  Medication Sig Dispense Refill  . ADVAIR DISKUS 500-50 MCG/DOSE AEPB INHALE 1 PUFF TWO TIMES  DAILY 180 each 0  . allopurinol (ZYLOPRIM) 300 MG tablet Take 1 tablet (300 mg total) by mouth daily. 60 tablet 0  . Cholecalciferol (VITAMIN D-3) 5000 units TABS Take 1 tablet by mouth daily.    . ferrous sulfate 325 (65 FE) MG  tablet Take 1 tablet (325 mg total) by mouth 2 (two) times daily with a meal. (Patient taking differently: Take 325 mg by mouth daily with breakfast. ) 60 tablet 3  . HYDROcodone-acetaminophen (NORCO) 5-325 MG tablet Take 1-2 tablets by mouth every 6 (six) hours as needed for moderate pain or severe pain. 30 tablet 0  . lactulose (CHRONULAC) 10 GM/15ML solution Take 15 mLs (10 g total) by mouth 2 (two) times daily as needed (need to ensure 2 soft BM in a day.). 240 mL 0  . montelukast (SINGULAIR) 10 MG tablet Take 1 tablet (10 mg total) by mouth at bedtime. 90 tablet 6  . Nutritional  Supplements (ENSURE COMPLETE SHAKE) LIQD Take 1 Container by mouth daily. 237 mL 11  . PROAIR HFA 108 (90 Base) MCG/ACT inhaler Use 2 puffs every 4 hours  as needed for wheezing or  shortness of breath 51 g 3   No facility-administered medications prior to visit.      Allergies:   Patient has no known allergies.   Social History   Social History  . Marital status: Divorced    Spouse name: N/A  . Number of children: 5  . Years of education: 10   Occupational History  . Retired- Administrator    Social History Main Topics  . Smoking status: Never Smoker  . Smokeless tobacco: Current User    Types: Snuff, Chew  . Alcohol use No     Comment: quit drinking "over 2 yr ago" (10/11/2016)  . Drug use: No  . Sexual activity: Not Currently   Other Topics Concern  . None   Social History Narrative   Lives with nephew and niece in [place] home.     Tobacco: snuff all day (can last two days. Never smoked.       Hobbies: cooking, sleeping, going to store   Pets:  Dog, Bean     Family History:  The patient's family history includes Asthma in his brother, brother, sister, and son; Heart disease in his sister; Hypertension in his brother and brother; Other in his father and mother.   ROS:   Please see the history of present illness.    ROS All other systems reviewed and are negative.   PHYSICAL EXAM:   VS:  BP 104/76   Pulse (!) 128   Ht 5\' 10"  (1.778 m)   Wt 160 lb (72.6 kg)   BMI 22.96 kg/m    General: Alert, oriented x3, no distress, no longer appears cachectic. Head: no evidence of trauma, PERRL, EOMI, no exophtalmos or lid lag, no myxedema, no xanthelasma; normal ears, nose and oropharynx Neck: normal jugular venous pulsations and no hepatojugular reflux; brisk carotid pulses without delay and no carotid bruits Chest: clear to auscultation, no signs of consolidation by percussion or palpation, normal fremitus, symmetrical and full respiratory excursions Cardiovascular:  normal position and quality of the apical impulse, regular rhythm, normal first and second heart sounds, no murmurs, rubs or gallops Abdomen: no tenderness or distention, no masses by palpation, no abnormal pulsatility or arterial bruits, normal bowel sounds, no hepatosplenomegaly. Umbilical hernia scar has healed well and he does not have any ascites by exam. He has a very large right inguinal hernia filling his scrotum. Extremities: no clubbing, cyanosis or edema; 2+ radial, ulnar and brachial pulses bilaterally; 2+ right femoral, posterior tibial and dorsalis pedis pulses; 2+ left femoral, posterior tibial and dorsalis pedis pulses; no subclavian or femoral bruits Neurological: grossly nonfocal Psych:  euthymic mood, full affect  Wt Readings from Last 3 Encounters:  12/29/16 160 lb (72.6 kg)  12/21/16 150 lb (68 kg)  11/04/16 145 lb (65.8 kg)      Studies/Labs Reviewed:   EKG:  EKG is ordered today.  It shows atrial flutter with  2:1 AV block and rapid ventricular response Recent Labs: 10/20/2016: Magnesium 1.9 10/23/2016: ALT 13; BUN 10; Creatinine, Ser 1.18; Hemoglobin 11.8; Platelets 224; Potassium 3.8; Sodium 137   Lipid Panel    Component Value Date/Time   CHOL 141 11/11/2011 1030   TRIG 128 11/11/2011 1030   HDL 71 11/11/2011 1030   CHOLHDL 2.0 11/11/2011 1030   VLDL 26 11/11/2011 1030   LDLCALC 44 11/11/2011 1030     ASSESSMENT:    1. Typical atrial flutter ([place])   2. Alcoholic cirrhosis of liver with ascites ([place])   3. CKD (chronic kidney disease) stage 3, GFR 30-59 ml/min   4. Unilateral inguinal hernia without obstruction or gangrene, recurrence not specified      PLAN:  In order of problems listed above:  1. Atrial flutter: Now again with rapid ventricular rate. not a good candidate for anticoagulation or ablation. He no longer has refractory ascites after the TIPS I think we can try to use beta blockers. May still have problems related to excessive drop in blood  pressure or worsening of bronchospasm. Will start a selective agent because of his reactive airway disease, even though a nonselective agent may be superior and cirrhosis. Start the supple 2.5 mg daily, increase to 5 mg daily if his blood pressure and asthma tolerated over the next 2-3 days. We'll bring him back in a couple weeks to see if we have achieved any results with rate control. Amiodarone led to severe junctional bradycardia, even when used in low dose. 2. Cirrhosis: seems to have improvement in hemodynamic problems after the TIPS procedure, no evidence of parenchymal insufficiency 3. CKD:  Markedly improved following his TIPS procedure, suggesting that he had prerenal azotemia. 4. Large right inguinal hernia: Hopefully we can achieve better ventricular rate control before he has to have surgery again. He tolerated the local hernia surgery without cardiovascular complications.    Medication Adjustments/Labs and Tests Ordered: Current medicines are reviewed at length with the patient today.  Concerns regarding medicines are outlined above.  Medication changes, Labs and Tests ordered today are listed in the Patient Instructions below. Patient Instructions  Dr Sallyanne Kuster has recommended making the following medication changes: 1. START Bisoprolol 5 mg tablets >>Start by taking a 0.5 mg tablet daily >>INCREASE to 1 tablet on SUNDAY if you don't experience increased dizziness/wheezing  Please call the office on Monday to speak with a nurse - let us know how you are feeling.  Your physician recommends that you schedule a follow-up appointment in 2 weeks with a NP/PA.  Dr Sallyanne Kuster recommends that you schedule a follow-up appointment in 3 months.  If you need a refill on your cardiac medications before your next appointment, please call your pharmacy.      Signed, Sanda Klein, MD  12/29/2016 10:37 AM    [facility] [address], [place], [place]  [address] Phone:  [phone]; Fax: [phone]

## 2016-12-29 NOTE — Patient Instructions (Signed)
Dr Sallyanne Kuster has recommended making the following medication changes: 1. START Bisoprolol 5 mg tablets >>Start by taking a 0.5 mg tablet daily >>INCREASE to 1 tablet on SUNDAY if you don't experience increased dizziness/wheezing  Please call the office on Monday to speak with a nurse - let us know how you are feeling.  Your physician recommends that you schedule a follow-up appointment in 2 weeks with a NP/PA.  Dr Sallyanne Kuster recommends that you schedule a follow-up appointment in 3 months.  If you need a refill on your cardiac medications before your next appointment, please call your pharmacy.

## 2016-12-30 ENCOUNTER — Encounter (HOSPITAL_COMMUNITY): Payer: Self-pay

## 2016-12-30 ENCOUNTER — Ambulatory Visit (HOSPITAL_COMMUNITY): Admission: RE | Admit: 2016-12-30 | Payer: Medicare Other | Source: Ambulatory Visit

## 2017-01-06 DIAGNOSIS — K4090 Unilateral inguinal hernia, without obstruction or gangrene, not specified as recurrent: Secondary | ICD-10-CM | POA: Diagnosis not present

## 2017-01-06 DIAGNOSIS — K7460 Unspecified cirrhosis of liver: Secondary | ICD-10-CM | POA: Diagnosis not present

## 2017-01-11 ENCOUNTER — Ambulatory Visit (INDEPENDENT_AMBULATORY_CARE_PROVIDER_SITE_OTHER): Payer: Medicare Other | Admitting: Physician Assistant

## 2017-01-11 VITALS — BP 108/76 | HR 100 | Ht 70.0 in | Wt 164.0 lb

## 2017-01-11 DIAGNOSIS — J4530 Mild persistent asthma, uncomplicated: Secondary | ICD-10-CM | POA: Diagnosis not present

## 2017-01-11 DIAGNOSIS — N183 Chronic kidney disease, stage 3 unspecified: Secondary | ICD-10-CM

## 2017-01-11 DIAGNOSIS — K4090 Unilateral inguinal hernia, without obstruction or gangrene, not specified as recurrent: Secondary | ICD-10-CM

## 2017-01-11 DIAGNOSIS — I483 Typical atrial flutter: Secondary | ICD-10-CM

## 2017-01-11 DIAGNOSIS — K7460 Unspecified cirrhosis of liver: Secondary | ICD-10-CM

## 2017-01-11 MED ORDER — BISOPROLOL FUMARATE 10 MG PO TABS: 10.0000 mg | ORAL_TABLET | Freq: Every day | ORAL | 1 refills | Status: DC

## 2017-01-11 NOTE — Patient Instructions (Addendum)
Medication Instructions:   INCREASE bisoprolol (Zebeta) from 5mg  to 10mg  DAILY  A new prescription has been sent to your pharmacy.  Labwork:   none  Testing/Procedures:  none  Follow-Up:  As scheduled with Dr. Sallyanne Kuster in November  If you need a refill on your cardiac medications before your next appointment, please call your pharmacy.

## 2017-01-11 NOTE — Progress Notes (Signed)
Cardiology Office Note    Date:  01/12/2017   ID:  Donald Moreno, DOB [birth year], MRN [id number]  PCP:  Shirley, [place], DO  Cardiologist:  Dr. Sallyanne Kuster  Chief Complaint  Patient presents with  . Follow-up    seen for Dr. Sallyanne Kuster    History of Present Illness:  Donald Moreno is a 76 y.o. male with PMH of advanced liver cirrhosis with portal HTN and ascites, atrial flutter with RVR, CKD, h/o EtOH abuse (quit for 3-4 years). He was treated with amiodarone for rate control and developed a junctional bradycardia, therefore medication was discontinued. He was hospitalized in June for incarcerated umbilical hernia and underwent urgent repair of 10/16/2016. He also undergone TIPS surgery. He did still undergo paracentesis on 10/23/2016 with removal of 3.7 L of amber fluid. He has a large right inguinal hernia. He was last seen in office on 12/29/2016, he was in atrial flutter with 2-1 AV block and a heart rate of 128. He was started on bisoprolol 2.5 mg daily with plan to go up to 5 mg daily if his blood pressure and asthma is able to tolerate it.  Patient presents today for cardiology office visit. EKG showed atrial fibrillation with heart rate 100 bpm. He continued to have poorly controlled heart rate. He is a poor candidate for systemic anticoagulation. His blood pressure today is 108/76. I will increase bisoprolol to 10 mg daily. He does have some degree of active wheezing on physical exam. He will monitor for increasing shortness of breath after increase on the bisoprolol. Otherwise he is concerned about when he should undergo inguinal hernia surgery. I think he is quite stable from cardiology perspective at this point. Given the fact that he recently underwent umbilical surgery with no active issue, I think he should be able to undergo inguinal hernia surgery as well. Otherwise, he denies any exertional chest discomfort or shortness of breath. He does have 1+ pitting edema in the right lower  extremity, however denies any pain with walking.   Past Medical History:  Diagnosis Date  . Arthritis    "all over"  . Asthma   . Chronic kidney disease    "I see a kidney dr @ [facility]" (10/11/2016)  . Chronic lower back pain    "turned a truck over a long time ago" (10/11/2016)  . ETOH abuse   . Gout   . Hypertension   . Liver cirrhosis ([place]) 2015  . Pneumonia    "long long time ago" (10/11/2016)  . Spontaneous bacterial peritonitis ([place]) 07/01/2015    Past Surgical History:  Procedure Laterality Date  . INSERTION OF MESH N/A 10/16/2016   Procedure: INSERTION OF MESH;  Surgeon: Kinsinger, Arta Bruce, MD;  Location: [facility];  Service: General;  Laterality: N/A;  . IR PARACENTESIS  08/19/2016  . IR PARACENTESIS  08/26/2016  . IR PARACENTESIS  09/02/2016  . IR PARACENTESIS  09/09/2016  . IR PARACENTESIS  09/16/2016  . IR PARACENTESIS  10/03/2016  . IR PARACENTESIS  10/11/2016  . IR RADIOLOGIST EVAL & MGMT  09/29/2016  . IR TIPS  10/11/2016  . RADIOLOGY WITH ANESTHESIA N/A 10/11/2016   Procedure: TIPS;  Surgeon: Arne Cleveland, MD;  Location: [facility];  Service: Radiology;  Laterality: N/A;  . UMBILICAL HERNIA REPAIR N/A 10/16/2016   Procedure: HERNIA REPAIR UMBILICAL ADULT;  Surgeon: Kinsinger, Arta Bruce, MD;  Location: [facility];  Service: General;  Laterality: N/A;    Current Medications: Outpatient Medications Prior to Visit  Medication Sig Dispense Refill  . ADVAIR DISKUS 500-50 MCG/DOSE AEPB INHALE 1 PUFF TWO TIMES  DAILY 180 each 0  . allopurinol (ZYLOPRIM) 300 MG tablet Take 1 tablet (300 mg total) by mouth daily. 60 tablet 0  . Cholecalciferol (VITAMIN D-3) 5000 units TABS Take 1 tablet by mouth daily.    . ferrous sulfate 325 (65 FE) MG tablet Take 1 tablet (325 mg total) by mouth 2 (two) times daily with a meal. (Patient taking differently: Take 325 mg by mouth daily with breakfast. ) 60 tablet 3  . furosemide (LASIX) 40 MG tablet Take 1 tablet by mouth daily.    Marland Kitchen lactulose  (CHRONULAC) 10 GM/15ML solution Take 15 mLs (10 g total) by mouth 2 (two) times daily as needed (need to ensure 2 soft BM in a day.). 240 mL 0  . montelukast (SINGULAIR) 10 MG tablet Take 1 tablet (10 mg total) by mouth at bedtime. 90 tablet 6  . Nutritional Supplements (ENSURE COMPLETE SHAKE) LIQD Take 1 Container by mouth daily. 237 mL 11  . PROAIR HFA 108 (90 Base) MCG/ACT inhaler Use 2 puffs every 4 hours  as needed for wheezing or  shortness of breath 51 g 3  . traMADol (ULTRAM) 50 MG tablet Take 50 mg by mouth every 6 (six) hours as needed.    . bisoprolol (ZEBETA) 5 MG tablet Take 1 tablet (5 mg total) by mouth daily. 90 tablet 3  . HYDROcodone-acetaminophen (NORCO) 5-325 MG tablet Take 1-2 tablets by mouth every 6 (six) hours as needed for moderate pain or severe pain. 30 tablet 0   No facility-administered medications prior to visit.      Allergies:   Patient has no known allergies.   Social History   Social History  . Marital status: Divorced    Spouse name: N/A  . Number of children: 5  . Years of education: 10   Occupational History  . Retired- Administrator    Social History Main Topics  . Smoking status: Never Smoker  . Smokeless tobacco: Current User    Types: Snuff, Chew  . Alcohol use No     Comment: quit drinking "over 2 yr ago" (10/11/2016)  . Drug use: No  . Sexual activity: Not Currently   Other Topics Concern  . None   Social History Narrative   Lives with nephew and niece in [place] home.     Tobacco: snuff all day (can last two days. Never smoked.       Hobbies: cooking, sleeping, going to store   Pets:  Dog, Bean     Family History:  The patient's family history includes Asthma in his brother, brother, sister, and son; Heart disease in his sister; Hypertension in his brother and brother; Other in his father and mother.   ROS:   Please see the history of present illness.    ROS All other systems reviewed and are negative.   PHYSICAL EXAM:     VS:  BP 108/76   Pulse 100   Ht 5\' 10"  (1.778 m)   Wt 164 lb (74.4 kg)   BMI 23.53 kg/m    GEN: Well nourished, well developed, in no acute distress  HEENT: normal  Neck: no JVD, carotid bruits, or masses Cardiac: Irregularly irregular; no murmurs, rubs, or gallops. 1+ edema in RLE Respiratory:  clear to auscultation bilaterally, normal work of breathing +mild wheezing GI: soft, nontender, nondistended, + BS MS: no deformity or atrophy  Skin: warm  and dry, no rash Neuro:  Alert and Oriented x 3, Strength and sensation are intact Psych: euthymic mood, full affect  Wt Readings from Last 3 Encounters:  01/11/17 164 lb (74.4 kg)  12/29/16 160 lb (72.6 kg)  12/21/16 150 lb (68 kg)      Studies/Labs Reviewed:   EKG:  EKG is ordered today.  The ekg ordered today demonstrates Atrial fibrillation, heart rate 100  Recent Labs: 10/20/2016: Magnesium 1.9 10/23/2016: ALT 13; BUN 10; Creatinine, Ser 1.18; Hemoglobin 11.8; Platelets 224; Potassium 3.8; Sodium 137   Lipid Panel    Component Value Date/Time   CHOL 141 11/11/2011 1030   TRIG 128 11/11/2011 1030   HDL 71 11/11/2011 1030   CHOLHDL 2.0 11/11/2011 1030   VLDL 26 11/11/2011 1030   LDLCALC 44 11/11/2011 1030    Additional studies/ records that were reviewed today include:   Abd pelvis CT 06/19/2016 IMPRESSION: 1. Small bowel loops are borderline to mildly dilated involving the distal jejunum and probably the proximal ileum. The most distal small bowel is decompressed and the colon is relatively decompressed relative to the small bowel. The findings are consistent with an early or partial small bowel obstruction. A discrete transition point is not seen but I suspect the transition point is in the mid ileum. There is an umbilical hernia which contains fluid. However, I see no extension of bowel into the hernia to suggest this is the cause. 2. Marked ascites. Fluid filled umbilical hernia. No extension of bowel into the  hernia. 3. The cecum extends into a right inguinal hernia with no resulting obstruction. The appendix is normal. 4. Cirrhosis. 5. Cholelithiasis. 6. Atherosclerosis.     Ultrasound paracentesis 10/23/2016 FINDINGS: A total of approximately 3.7 liters of amber fluid was removed.  IMPRESSION: Successful ultrasound-guided therapeutic paracentesis yielding 3.7 liters of peritoneal fluid.   ASSESSMENT:    1. Typical atrial flutter ([place])   2. Cirrhosis of liver without ascites, unspecified hepatic cirrhosis type ([place])   3. CKD (chronic kidney disease), stage III   4. Unilateral inguinal hernia without obstruction or gangrene, recurrence not specified   5. Mild persistent asthma without complication      PLAN:  In order of problems listed above:  1. Atrial flutter: Heart rate is borderline controlled, I will increase bisoprolol to 10 mg daily. He does have active wheezing on physical exam, he will monitor for worsening shortness of breath. He is not a candidate for systemic data coagulation therapy.  2. Persistent asthma: Wheezing on physical exam, however he denies any significant shortness of breath  3. Liver cirrhosis: s/p TIPS, last paracentesis was back in June. However this is not required any further paracentesis.  4. CKD stage II: Renal function stable.  5. Inguinal hernia: Recently underwent umbilical hernia without any significant issue. His heart rate is better controlled at this point, I think it would be reasonable to proceed with inguinal hernia repair. He would be a moderate risk patient given his comorbidities.    Medication Adjustments/Labs and Tests Ordered: Current medicines are reviewed at length with the patient today.  Concerns regarding medicines are outlined above.  Medication changes, Labs and Tests ordered today are listed in the Patient Instructions below. Patient Instructions  Medication Instructions:   INCREASE bisoprolol (Zebeta) from 5mg  to  10mg  DAILY  A new prescription has been sent to your pharmacy.  Labwork:   none  Testing/Procedures:  none  Follow-Up:  As scheduled with Dr. Sallyanne Kuster in November  If you need a refill on your cardiac medications before your next appointment, please call your pharmacy.      Donald Moreno, [place]  01/12/2017 9:32 PM    [facility] [place], [place], [place]  [address] Phone: [phone]; Fax: [phone]

## 2017-01-12 ENCOUNTER — Encounter: Payer: Self-pay | Admitting: Physician Assistant

## 2017-01-12 ENCOUNTER — Other Ambulatory Visit: Payer: Self-pay

## 2017-01-12 MED ORDER — BISOPROLOL FUMARATE 10 MG PO TABS: 10.0000 mg | ORAL_TABLET | Freq: Every day | ORAL | 1 refills | Status: DC

## 2017-01-13 ENCOUNTER — Encounter: Payer: Self-pay | Admitting: Family Medicine

## 2017-01-13 ENCOUNTER — Other Ambulatory Visit: Payer: Self-pay | Admitting: Obstetrics and Gynecology

## 2017-01-13 ENCOUNTER — Other Ambulatory Visit: Payer: Self-pay | Admitting: Family Medicine

## 2017-01-13 ENCOUNTER — Ambulatory Visit: Payer: Medicare Other | Admitting: Physician Assistant

## 2017-01-13 ENCOUNTER — Ambulatory Visit (INDEPENDENT_AMBULATORY_CARE_PROVIDER_SITE_OTHER): Payer: Medicare Other | Admitting: Family Medicine

## 2017-01-13 VITALS — BP 124/80 | HR 70 | Temp 98.7°F | Ht 70.0 in | Wt 164.0 lb

## 2017-01-13 DIAGNOSIS — Z23 Encounter for immunization: Secondary | ICD-10-CM

## 2017-01-13 DIAGNOSIS — Z0000 Encounter for general adult medical examination without abnormal findings: Secondary | ICD-10-CM | POA: Diagnosis not present

## 2017-01-13 DIAGNOSIS — K4090 Unilateral inguinal hernia, without obstruction or gangrene, not specified as recurrent: Secondary | ICD-10-CM

## 2017-01-13 DIAGNOSIS — J45909 Unspecified asthma, uncomplicated: Secondary | ICD-10-CM

## 2017-01-13 DIAGNOSIS — I483 Typical atrial flutter: Secondary | ICD-10-CM | POA: Diagnosis not present

## 2017-01-13 NOTE — Assessment & Plan Note (Signed)
Reducible. Patient aware to go to he ER if pain worsens and he can no longer reduce. Cleared by cardiology for repair. Patient has an appointment with [facility] on October 11 at 4:15 pm. He will schedule this appointment to be sooner himself to have his hernia repaired.

## 2017-01-13 NOTE — Assessment & Plan Note (Addendum)
Follows closely with his cardiologist. Continue with  bisoprolol 10 mg per cardiology recommendations. He is not a candidate for systemic data coagulation therapy.

## 2017-01-13 NOTE — Assessment & Plan Note (Signed)
Persistent. Has wheezing on exam, but denies SOB and use of his albuterol inhaler. Patient informed to return to clinic or go to the ER if he has worsening SOB unresolved by using his inhaler.

## 2017-01-13 NOTE — Progress Notes (Signed)
   Subjective:    Patient ID: Donald Moreno, male    DOB: 1941/03/19, 76 y.o.   MRN: [id number]   CC: Medication Refill   HPI:  Here for refill and to check in to meet his new PCP.  Patient reports he is feeling well, does all of his ADL's and has no trouble walking and getting around. He would also like his flu shot today.   Inguinal hernia: Recently underwent umbilical hernia repair without any significant issue on 10/16/2016. His heart rate has been better controlled,and cardiology reports that he may  proceed with inguinal hernia repair.   Atrial flutter: Heart rate is controlled, his bisoprolol was increased to10 mg two days ago. Per cardiology: he is not a candidate for systemic data coagulation therapy.  Persistent asthma: Expiratory wheezing noted on physical exam, however he denies any shortness of breath or need for his albuterol inhaler.  Smoking status reviewed  Review of Systems  Per HPI, else denies recent illness, fever, headache, changes in vision, chest pain, shortness of breath, abdominal pain, N/V/D, weakness   Patient Active Problem List   Diagnosis Date Noted  . Advance care planning 08/01/2015  . Alcoholic cirrhosis of liver with ascites ([place])   . Typical atrial flutter ([place])   . Protein-calorie malnutrition, severe 07/02/2015  . CKD (chronic kidney disease) stage 3, GFR 30-59 ml/min 05/27/2015  . History of GI bleed 03/04/2015  . At risk for decreased bone density 03/04/2015  . Weight loss, unintentional 03/04/2015  . Inguinal hernia 10/28/2014  . Preventative health care 11/11/2011  . Asthma 01/29/2010  . NICOTINE ADDICTION 04/30/2007  . Gout 07/13/2006  . HYPERTENSION, BENIGN SYSTEMIC 07/13/2006     Objective:  BP 124/80   Pulse 70   Temp 98.7 F (37.1 C) (Oral)   Ht 5\' 10"  (1.778 m)   Wt 164 lb (74.4 kg)   SpO2 94%   BMI 23.53 kg/m  Vitals and nursing note reviewed  General: NAD Cardiac: Irregular rhythm, regular rate, normal heart  sounds, no murmurs. 2+ radial pulses bilaterally Respiratory: mild expiratory wheeze, normal effort Abdomen: soft, nontender, nondistended; reducible large inguinal hernia noted on the right. Extremities: no edema or cyanosis. WWP. Skin: warm and dry, no rashes noted Neuro: alert and oriented, no focal deficits   Assessment & Plan:   Inguinal hernia Reducible. Patient aware to go to he ER if pain worsens and he can no longer reduce. Cleared by cardiology for repair. Patient has an appointment with [facility] on October 11 at 4:15 pm. He will schedule this appointment to be sooner himself to have his hernia repaired.   Typical atrial flutter ([place]) Follows closely with his cardiologist. Continue with  bisoprolol 10 mg per cardiology recommendations. He is not a candidate for systemic data coagulation therapy.  Asthma Persistent. Has wheezing on exam, but denies SOB and use of his albuterol inhaler. Patient informed to return to clinic or go to the ER if he has worsening SOB unresolved by using his inhaler.   [place] Linetta Regner, DO Family Medicine Resident PGY-1

## 2017-01-13 NOTE — Patient Instructions (Addendum)
Thank you for coming to see me today. It was a pleasure. Today we talked about your medications.   You received your flu shot.  Please follow-up with [facility] on February 23, 2017 at 4:15 pm to discuss your inguinal hernia repair. You may call them if you would like an earlier appointment.   Please follow- up here in the office if you have any problems or in 6 months.   If you have any questions or concerns, please do not hesitate to call the office at [phone].  Take Care,   [place] Martell Mcfadyen, DO

## 2017-01-17 ENCOUNTER — Emergency Department (HOSPITAL_COMMUNITY)
Admission: EM | Admit: 2017-01-17 | Discharge: 2017-01-17 | Disposition: A | Discharge status: Home / Self Care | Payer: Medicare Other | Attending: Emergency Medicine | Admitting: Emergency Medicine

## 2017-01-17 ENCOUNTER — Encounter (HOSPITAL_COMMUNITY): Payer: Self-pay | Admitting: Emergency Medicine

## 2017-01-17 DIAGNOSIS — R109 Unspecified abdominal pain: Secondary | ICD-10-CM

## 2017-01-17 DIAGNOSIS — K4091 Unilateral inguinal hernia, without obstruction or gangrene, recurrent: Secondary | ICD-10-CM | POA: Insufficient documentation

## 2017-01-17 DIAGNOSIS — N183 Chronic kidney disease, stage 3 (moderate): Secondary | ICD-10-CM | POA: Diagnosis not present

## 2017-01-17 DIAGNOSIS — R1031 Right lower quadrant pain: Secondary | ICD-10-CM | POA: Diagnosis not present

## 2017-01-17 DIAGNOSIS — Z79899 Other long term (current) drug therapy: Secondary | ICD-10-CM | POA: Diagnosis not present

## 2017-01-17 DIAGNOSIS — I129 Hypertensive chronic kidney disease with stage 1 through stage 4 chronic kidney disease, or unspecified chronic kidney disease: Secondary | ICD-10-CM | POA: Diagnosis not present

## 2017-01-17 DIAGNOSIS — J45909 Unspecified asthma, uncomplicated: Secondary | ICD-10-CM | POA: Diagnosis not present

## 2017-01-17 LAB — CBC
HEMATOCRIT: 36.3 % — AB (ref 39.0–52.0)
HEMOGLOBIN: 12.1 g/dL — AB (ref 13.0–17.0)
MCH: 34 pg (ref 26.0–34.0)
MCHC: 33.3 g/dL (ref 30.0–36.0)
MCV: 102 fL — AB (ref 78.0–100.0)
Platelets: 165 10*3/uL (ref 150–400)
RBC: 3.56 MIL/uL — AB (ref 4.22–5.81)
RDW: 14.4 % (ref 11.5–15.5)
WBC: 5.8 10*3/uL (ref 4.0–10.5)

## 2017-01-17 LAB — URINALYSIS, ROUTINE W REFLEX MICROSCOPIC
Bilirubin Urine: NEGATIVE
GLUCOSE, UA: NEGATIVE mg/dL
HGB URINE DIPSTICK: NEGATIVE
Ketones, ur: NEGATIVE mg/dL
Leukocytes, UA: NEGATIVE
Nitrite: NEGATIVE
PH: 5 (ref 5.0–8.0)
Protein, ur: NEGATIVE mg/dL
SPECIFIC GRAVITY, URINE: 1.013 (ref 1.005–1.030)

## 2017-01-17 LAB — COMPREHENSIVE METABOLIC PANEL
ALT: 22 U/L (ref 17–63)
ANION GAP: 9 (ref 5–15)
AST: 39 U/L (ref 15–41)
Albumin: 3.3 g/dL — ABNORMAL LOW (ref 3.5–5.0)
Alkaline Phosphatase: 100 U/L (ref 38–126)
BUN: 27 mg/dL — ABNORMAL HIGH (ref 6–20)
CHLORIDE: 108 mmol/L (ref 101–111)
CO2: 23 mmol/L (ref 22–32)
Calcium: 9.6 mg/dL (ref 8.9–10.3)
Creatinine, Ser: 1.69 mg/dL — ABNORMAL HIGH (ref 0.61–1.24)
GFR calc non Af Amer: 38 mL/min — ABNORMAL LOW (ref >60)
GFR, EST AFRICAN AMERICAN: 44 mL/min — AB (ref >60)
Glucose, Bld: 89 mg/dL (ref 65–99)
POTASSIUM: 3.8 mmol/L (ref 3.5–5.1)
SODIUM: 140 mmol/L (ref 135–145)
Total Bilirubin: 1.5 mg/dL — ABNORMAL HIGH (ref 0.3–1.2)
Total Protein: 7.9 g/dL (ref 6.5–8.1)

## 2017-01-17 LAB — LIPASE, BLOOD: LIPASE: 44 U/L (ref 11–51)

## 2017-01-17 NOTE — ED Triage Notes (Signed)
Pt states he has been having abd pain off and on for 3 weeks. Pt states he has a right inguinal hernia. Denies N/V.

## 2017-01-17 NOTE — Discharge Instructions (Signed)
Your hernia has been reduced with gentle pressure and with you laying flat.  If your hernia causes more pain or swelling and you are unable to get it to go back in after 20 minutes of gentle pressure, he should return to the emergency Department immediately.  Otherwise you can see your family doctor for ongoing pain control and your general surgeon at your appointment for a hernia repair.
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
## 2017-01-17 NOTE — ED Notes (Signed)
Pt provided with water

## 2017-01-17 NOTE — ED Notes (Signed)
ED Provider at bedside. 

## 2017-01-22 ENCOUNTER — Other Ambulatory Visit: Payer: Self-pay | Admitting: Obstetrics and Gynecology

## 2017-01-23 ENCOUNTER — Emergency Department (HOSPITAL_COMMUNITY): Payer: Medicare Other

## 2017-01-23 ENCOUNTER — Emergency Department (HOSPITAL_COMMUNITY)
Admission: EM | Admit: 2017-01-23 | Discharge: 2017-01-23 | Disposition: A | Discharge status: Home / Self Care | Payer: Medicare Other | Attending: Emergency Medicine | Admitting: Emergency Medicine

## 2017-01-23 ENCOUNTER — Encounter (HOSPITAL_COMMUNITY): Payer: Self-pay | Admitting: Emergency Medicine

## 2017-01-23 ENCOUNTER — Telehealth: Payer: Self-pay | Admitting: *Deleted

## 2017-01-23 DIAGNOSIS — R109 Unspecified abdominal pain: Secondary | ICD-10-CM | POA: Diagnosis not present

## 2017-01-23 DIAGNOSIS — Z79899 Other long term (current) drug therapy: Secondary | ICD-10-CM | POA: Insufficient documentation

## 2017-01-23 DIAGNOSIS — F17290 Nicotine dependence, other tobacco product, uncomplicated: Secondary | ICD-10-CM | POA: Diagnosis not present

## 2017-01-23 DIAGNOSIS — N183 Chronic kidney disease, stage 3 (moderate): Secondary | ICD-10-CM | POA: Diagnosis not present

## 2017-01-23 DIAGNOSIS — K4020 Bilateral inguinal hernia, without obstruction or gangrene, not specified as recurrent: Secondary | ICD-10-CM | POA: Diagnosis not present

## 2017-01-23 DIAGNOSIS — R319 Hematuria, unspecified: Secondary | ICD-10-CM

## 2017-01-23 DIAGNOSIS — I4892 Unspecified atrial flutter: Secondary | ICD-10-CM | POA: Diagnosis not present

## 2017-01-23 DIAGNOSIS — J45909 Unspecified asthma, uncomplicated: Secondary | ICD-10-CM | POA: Diagnosis not present

## 2017-01-23 DIAGNOSIS — R000 Tachycardia, unspecified: Secondary | ICD-10-CM | POA: Diagnosis not present

## 2017-01-23 DIAGNOSIS — I129 Hypertensive chronic kidney disease with stage 1 through stage 4 chronic kidney disease, or unspecified chronic kidney disease: Secondary | ICD-10-CM | POA: Insufficient documentation

## 2017-01-23 LAB — BASIC METABOLIC PANEL
ANION GAP: 8 (ref 5–15)
BUN: 15 mg/dL (ref 6–20)
CO2: 23 mmol/L (ref 22–32)
Calcium: 8.9 mg/dL (ref 8.9–10.3)
Chloride: 107 mmol/L (ref 101–111)
Creatinine, Ser: 1.43 mg/dL — ABNORMAL HIGH (ref 0.61–1.24)
GFR calc Af Amer: 54 mL/min — ABNORMAL LOW (ref >60)
GFR, EST NON AFRICAN AMERICAN: 47 mL/min — AB (ref >60)
GLUCOSE: 89 mg/dL (ref 65–99)
POTASSIUM: 3.1 mmol/L — AB (ref 3.5–5.1)
Sodium: 138 mmol/L (ref 135–145)

## 2017-01-23 LAB — CBC WITH DIFFERENTIAL/PLATELET
BASOS ABS: 0 10*3/uL (ref 0.0–0.1)
Basophils Relative: 1 %
EOS PCT: 5 %
Eosinophils Absolute: 0.3 10*3/uL (ref 0.0–0.7)
HEMATOCRIT: 34.1 % — AB (ref 39.0–52.0)
Hemoglobin: 11.6 g/dL — ABNORMAL LOW (ref 13.0–17.0)
LYMPHS ABS: 1.8 10*3/uL (ref 0.7–4.0)
LYMPHS PCT: 29 %
MCH: 34.4 pg — AB (ref 26.0–34.0)
MCHC: 34 g/dL (ref 30.0–36.0)
MCV: 101.2 fL — ABNORMAL HIGH (ref 78.0–100.0)
MONO ABS: 0.6 10*3/uL (ref 0.1–1.0)
Monocytes Relative: 10 %
NEUTROS ABS: 3.4 10*3/uL (ref 1.7–7.7)
Neutrophils Relative %: 55 %
PLATELETS: 134 10*3/uL — AB (ref 150–400)
RBC: 3.37 MIL/uL — AB (ref 4.22–5.81)
RDW: 13.7 % (ref 11.5–15.5)
WBC: 6.1 10*3/uL (ref 4.0–10.5)

## 2017-01-23 LAB — URINALYSIS, ROUTINE W REFLEX MICROSCOPIC
Bilirubin Urine: NEGATIVE
GLUCOSE, UA: NEGATIVE mg/dL
Ketones, ur: NEGATIVE mg/dL
Leukocytes, UA: NEGATIVE
Nitrite: NEGATIVE
Protein, ur: NEGATIVE mg/dL
SPECIFIC GRAVITY, URINE: 1.018 (ref 1.005–1.030)
pH: 5 (ref 5.0–8.0)

## 2017-01-23 LAB — PROTIME-INR
INR: 1.45
Prothrombin Time: 17.5 seconds — ABNORMAL HIGH (ref 11.4–15.2)

## 2017-01-23 LAB — MAGNESIUM: MAGNESIUM: 1.5 mg/dL — AB (ref 1.7–2.4)

## 2017-01-23 LAB — APTT: APTT: 38 s — AB (ref 24–36)

## 2017-01-23 MED ORDER — POTASSIUM CHLORIDE CRYS ER 20 MEQ PO TBCR
40.0000 meq | EXTENDED_RELEASE_TABLET | Freq: Once | ORAL | Status: AC
  Administered 2017-01-23: 40 meq via ORAL
  Filled 2017-01-23: qty 2

## 2017-01-23 MED ORDER — SODIUM CHLORIDE 0.9 % IV BOLUS (SEPSIS)
500.0000 mL | Freq: Once | INTRAVENOUS | Status: AC
  Administered 2017-01-23: 500 mL via INTRAVENOUS

## 2017-01-23 MED ORDER — MAGNESIUM OXIDE 400 (241.3 MG) MG PO TABS
400.0000 mg | ORAL_TABLET | Freq: Once | ORAL | Status: AC
  Administered 2017-01-23: 400 mg via ORAL
  Filled 2017-01-23: qty 1

## 2017-01-23 MED ORDER — METOPROLOL TARTRATE 5 MG/5ML IV SOLN
5.0000 mg | Freq: Once | INTRAVENOUS | Status: AC
  Administered 2017-01-23: 5 mg via INTRAVENOUS
  Filled 2017-01-23: qty 5

## 2017-01-23 MED ORDER — CEPHALEXIN 500 MG PO CAPS: 500.0000 mg | ORAL_CAPSULE | Freq: Two times a day (BID) | ORAL | 0 refills | Status: DC

## 2017-01-23 MED ORDER — IOPAMIDOL (ISOVUE-300) INJECTION 61%
INTRAVENOUS | Status: AC
  Administered 2017-01-23: 80 mL
  Filled 2017-01-23: qty 100

## 2017-01-23 NOTE — Discharge Instructions (Signed)
It is very important that you follow-up with urology for evaluation of the blood in your urine, if the blood in the urine persists (even microscopically, you may not see a) it may signify that you have a mass that could be cancer in your bladder) he will need to follow either with your primary. Care doctor or with urology to further evaluate this.  Your heart rate today was elevated. Please follow with your cardiologist as soon as possible and do not hesitate to return to the emergency department for any new, worsening or concerning symptoms.

## 2017-01-23 NOTE — ED Provider Notes (Signed)
[facility] DEPT Provider Note   CSN: [id number] Arrival date & time: 01/23/17  8341     History   Chief Complaint Chief Complaint  Patient presents with  . Hematuria    HPI   Blood pressure (!) 140/100, pulse 63, temperature 98.1 F (36.7 C), temperature source Oral, resp. rate 20, height 5\' 10"  (1.778 m), weight 74.4 kg (164 lb), SpO2 100 %.  Donald Moreno is a 76 y.o. male with past medical history significant for cirrhosis, complaining of Hematuria onset this morning at 4 AM is not associated with dysuria, urinary frequency, sensation of incomplete void, fever chills nausea vomiting or flank pain. Patient also notes a abdominal pain intermittent over the last several weeks she states it's worse when he walks. He does not have any decrease in stooling or flatus. He is eating and drinking normally. He states that he is unsure if he has a urologist. He is not anticoagulated.Patient does not smoke but he does use chewing tobacco.   Past Medical History:  Diagnosis Date  . Arthritis    "all over"  . Asthma   . Chronic kidney disease    "I see a kidney dr @ [facility]" (10/11/2016)  . Chronic lower back pain    "turned a truck over a long time ago" (10/11/2016)  . ETOH abuse   . Gout   . Hypertension   . Liver cirrhosis ([place]) 2015  . Pneumonia    "long long time ago" (10/11/2016)  . Spontaneous bacterial peritonitis ([place]) 07/01/2015    Patient Active Problem List   Diagnosis Date Noted  . Advance care planning 08/01/2015  . Alcoholic cirrhosis of liver with ascites ([place])   . Typical atrial flutter ([place])   . Protein-calorie malnutrition, severe 07/02/2015  . CKD (chronic kidney disease) stage 3, GFR 30-59 ml/min 05/27/2015  . History of GI bleed 03/04/2015  . At risk for decreased bone density 03/04/2015  . Weight loss, unintentional 03/04/2015  . Inguinal hernia 10/28/2014  . Preventative health care 11/11/2011  . Asthma 01/29/2010  . NICOTINE ADDICTION  04/30/2007  . Gout 07/13/2006  . HYPERTENSION, BENIGN SYSTEMIC 07/13/2006    Past Surgical History:  Procedure Laterality Date  . INSERTION OF MESH N/A 10/16/2016   Procedure: INSERTION OF MESH;  Surgeon: Kinsinger, Arta Bruce, MD;  Location: [facility];  Service: General;  Laterality: N/A;  . IR PARACENTESIS  08/19/2016  . IR PARACENTESIS  08/26/2016  . IR PARACENTESIS  09/02/2016  . IR PARACENTESIS  09/09/2016  . IR PARACENTESIS  09/16/2016  . IR PARACENTESIS  10/03/2016  . IR PARACENTESIS  10/11/2016  . IR RADIOLOGIST EVAL & MGMT  09/29/2016  . IR TIPS  10/11/2016  . RADIOLOGY WITH ANESTHESIA N/A 10/11/2016   Procedure: TIPS;  Surgeon: Arne Cleveland, MD;  Location: [facility];  Service: Radiology;  Laterality: N/A;  . UMBILICAL HERNIA REPAIR N/A 10/16/2016   Procedure: HERNIA REPAIR UMBILICAL ADULT;  Surgeon: Kinsinger, Arta Bruce, MD;  Location: [facility];  Service: General;  Laterality: N/A;       Home Medications    Prior to Admission medications   Medication Sig Start Date End Date Taking? Authorizing Provider  ADVAIR DISKUS 500-50 MCG/DOSE AEPB INHALE 1 PUFF TWO TIMES  DAILY 01/23/17   Shirley, [place], DO  allopurinol (ZYLOPRIM) 300 MG tablet Take 1 tablet (300 mg total) by mouth daily. 09/16/16   Katheren Shams, DO  bisoprolol (ZEBETA) 10 MG tablet Take 1 tablet (10 mg total) by  mouth daily. 01/12/17   Almyra Deforest, PA  cephALEXin (KEFLEX) 500 MG capsule Take 1 capsule (500 mg total) by mouth 2 (two) times daily. 01/23/17   Kizer Nobbe, Elmyra Ricks, PA-C  Cholecalciferol (VITAMIN D-3) 5000 units TABS Take 1 tablet by mouth daily.    [provider]  ferrous sulfate 325 (65 FE) MG tablet Take 1 tablet (325 mg total) by mouth 2 (two) times daily with a meal. Patient taking differently: Take 325 mg by mouth daily with breakfast.  10/21/14   Ronnie Doss M, DO  furosemide (LASIX) 40 MG tablet Take 1 tablet by mouth daily. 12/08/16   [provider]  lactulose (CHRONULAC) 10 GM/15ML solution  Take 15 mLs (10 g total) by mouth 2 (two) times daily as needed (need to ensure 2 soft BM in a day.). 10/23/16   Lavina Hamman, MD  montelukast (SINGULAIR) 10 MG tablet TAKE 1 TABLET BY MOUTH AT  BEDTIME 01/17/17   Shirley, [place], DO  Nutritional Supplements (ENSURE COMPLETE SHAKE) LIQD Take 1 Container by mouth daily. 03/04/15   Katheren Shams, DO  PROAIR HFA 108 [phone] Base) MCG/ACT inhaler Use 2 puffs every 4 hours  as needed for wheezing or  shortness of breath 11/26/15   Veatrice [facility], MD    Family History Family History  Problem Relation Age of Onset  . Other Mother        died when pt was only 23 - unknown cause.  . Other Father        deceased.  . Asthma Sister   . Heart disease Sister        multiple stents.  . Asthma Brother   . Hypertension Brother   . Asthma Son   . Asthma Brother   . Hypertension Brother     Social History Social History  Substance Use Topics  . Smoking status: Never Smoker  . Smokeless tobacco: Current User    Types: Snuff, Chew  . Alcohol use No     Comment: quit drinking "over 2 yr ago" (10/11/2016)     Allergies   Patient has no known allergies.   Review of Systems Review of Systems  A complete review of systems was obtained and all systems are negative except as noted in the HPI and PMH.    Physical Exam Updated Vital Signs BP 132/78 (BP Location: Left Arm)   Pulse (!) 126   Temp 98.1 F (36.7 C) (Oral)   Resp 14   Ht 5\' 10"  (1.778 m)   Wt 74.4 kg (164 lb)   SpO2 96%   BMI 23.53 kg/m   Physical Exam  Constitutional: He is oriented to person, place, and time. He appears well-developed and well-nourished. No distress.  HENT:  Head: Normocephalic and atraumatic.  Mouth/Throat: Oropharynx is clear and moist.  Eyes: Pupils are equal, round, and reactive to light. Conjunctivae and EOM are normal.  Neck: Normal range of motion.  Cardiovascular: Normal rate, regular rhythm and intact distal pulses.   Pulmonary/Chest: Effort  normal and breath sounds normal.  Abdominal: Soft. He exhibits no distension and no mass. There is no tenderness. There is no rebound and no guarding. No hernia.  Musculoskeletal: Normal range of motion.  Neurological: He is alert and oriented to person, place, and time.  Skin: Capillary refill takes less than 2 seconds. No rash (.npmdm) noted. He is not diaphoretic.  Psychiatric: He has a normal mood and affect.  Nursing note and vitals reviewed.  ED Treatments / Results  Labs (all labs ordered are listed, but only abnormal results are displayed) Labs Reviewed  URINALYSIS, ROUTINE W REFLEX MICROSCOPIC - Abnormal; Notable for the following:       Result Value   APPearance HAZY (*)    Hgb urine dipstick LARGE (*)    Bacteria, UA RARE (*)    Squamous Epithelial / LPF 0-5 (*)    All other components within normal limits  CBC WITH DIFFERENTIAL/PLATELET - Abnormal; Notable for the following:    RBC 3.37 (*)    Hemoglobin 11.6 (*)    HCT 34.1 (*)    MCV 101.2 (*)    MCH 34.4 (*)    Platelets 134 (*)    All other components within normal limits  BASIC METABOLIC PANEL - Abnormal; Notable for the following:    Potassium 3.1 (*)    Creatinine, Ser 1.43 (*)    GFR calc non Af Amer 47 (*)    GFR calc Af Amer 54 (*)    All other components within normal limits  PROTIME-INR - Abnormal; Notable for the following:    Prothrombin Time 17.5 (*)    All other components within normal limits  APTT - Abnormal; Notable for the following:    aPTT 38 (*)    All other components within normal limits  MAGNESIUM - Abnormal; Notable for the following:    Magnesium 1.5 (*)    All other components within normal limits  URINE CULTURE    EKG  EKG Interpretation None       Radiology Dg Chest 2 View  Result Date: 01/23/2017 CLINICAL DATA:  Tachycardia. EXAM: CHEST  2 VIEW COMPARISON:  07/01/2015 FINDINGS: There is at mild cardiac enlargement. Thickening of the fissures noted bilaterally  indicating a small amount of pleural fluid. There is diffuse pulmonary vascular congestion. No superimposed airspace consolidation. IMPRESSION: Suspect mild CHF. Electronically Signed   By: Kerby Moors M.D.   On: 01/23/2017 10:52   Ct Abdomen Pelvis W Contrast  Result Date: 01/23/2017 CLINICAL DATA:  Complicated hernia.  Abdominal pain for 1 week. EXAM: CT ABDOMEN AND PELVIS WITH CONTRAST TECHNIQUE: Multidetector CT imaging of the abdomen and pelvis was performed using the standard protocol following bolus administration of intravenous contrast. CONTRAST:  <See Chart> ISOVUE-300 IOPAMIDOL (ISOVUE-300) INJECTION 61% COMPARISON:  06/19/2016 FINDINGS: Lower chest: Small layering pleural effusions. Mild atelectasis at the bases. Hepatobiliary: Cirrhosis. There is a TIPS stent which appears to be enhancing. This was evaluated by Doppler recently. Hilar cystic density that is stable. No evidence of solid mass.Cholelithiasis. No evidence of acute cholecystitis. Pancreas: Unremarkable. Spleen: Unremarkable. Adrenals/Urinary Tract: Negative adrenals. No hydronephrosis or stone. Unremarkable bladder. Stomach/Bowel: Bilateral inguinal hernia containing ascitic fluid, particularly large on the right. The right inguinal hernia also contains terminal ileum and cecum which does not appear thickened or obstructed. No appendicitis. Vascular/Lymphatic: Atherosclerotic calcification. TIPS stent that appears to be patent. No acute finding. No mass or adenopathy. Reproductive:Symmetric prostate enlargement Other: There is moderate ascites. In the gutters, pelvis, and epigastrium there is visible peritoneal enhancement and smooth thickening. Anasarca Musculoskeletal: Severe bilateral hip osteoarthritis with changes of underlying avascular necrosis. Remote posterior left rib fractures. IMPRESSION: 1. No acute finding when compared to priors. 2. Bilateral inguinal hernia, particularly large on the right where there is herniation of  nonobstructed terminal ileum and proximal colon. Left-sided hernia has progressed since February 2018; the right-sided hernia is essentially stable. 3. Cirrhosis with moderate ascites despite a TIPS  stent that appears to be patent. Subtle peritoneal thickening is present, correlate for peritonitis symptoms. 4. Anasarca and small layering pleural effusions. 5. Cholelithiasis 6. Bilateral femoral head AVN and advanced osteoarthritis. Electronically Signed   By: Monte Fantasia M.D.   On: 01/23/2017 10:45    Procedures Procedures (including critical care time)  Medications Ordered in ED Medications  metoprolol tartrate (LOPRESSOR) injection 5 mg (5 mg Intravenous Given 01/23/17 1232)  potassium chloride SA (K-DUR,KLOR-CON) CR tablet 40 mEq (40 mEq Oral Given 01/23/17 1232)  iopamidol (ISOVUE-300) 61 % injection (80 mLs  Contrast Given 01/23/17 1020)  sodium chloride 0.9 % bolus 500 mL (0 mLs Intravenous Stopped 01/23/17 1306)  magnesium oxide (MAG-OX) tablet 400 mg (400 mg Oral Given 01/23/17 1305)     Initial Impression / Assessment and Plan / ED Course  I have reviewed the triage vital signs and the nursing notes.  Pertinent labs & imaging results that were available during my care of the patient were reviewed by me and considered in my medical decision making (see chart for details).     Vitals:   01/23/17 1009 01/23/17 1021 01/23/17 1220 01/23/17 1302  BP: (!) 142/90 (!) 142/83 134/76 132/78  Pulse: (!) 126     Resp: 20 16 14    Temp:      TempSrc:      SpO2: 95% 97% 96% 96%  Weight:      Height:        Medications  metoprolol tartrate (LOPRESSOR) injection 5 mg (5 mg Intravenous Given 01/23/17 1232)  potassium chloride SA (K-DUR,KLOR-CON) CR tablet 40 mEq (40 mEq Oral Given 01/23/17 1232)  iopamidol (ISOVUE-300) 61 % injection (80 mLs  Contrast Given 01/23/17 1020)  sodium chloride 0.9 % bolus 500 mL (0 mLs Intravenous Stopped 01/23/17 1306)  magnesium oxide (MAG-OX) tablet 400 mg  (400 mg Oral Given 01/23/17 1305)    Donald Moreno is 76 y.o. male presenting with Gross hematuria onset this a.m. with no signs of UTI. Patient afebrile, well-appearing, no CVA tenderness to percussion. Urinalysis is not grossly infected, urine culture pending. He is reporting abdominal pain which she's had intermittently over several weeks. Abdominal exam is nonfocal. Will check basic blood work and ask attending to evaluate.  Patient has become tachycardic with heart rate in the 120s. EKG with possible atrial flutter versus ectopic atrial pacemaker. Patient is put on the cardiac monitor and will move to appropriate room. Chart review shows that this patient has a history of atrial flutter, he was found to be a poor candidate for anticoagulation. Per patient he does follow regularly with cardiology and his best Eddie Dibbles was recently increased from 5 mg to 10 mg daily he states that he's been compliant with that. He denies any active chest pain, palpitations or shortness of breath.  After patient was given metoprolol IV heart rate has improved to 107. He remains asymptomatic with no chest pain, palpitations or shortness of breath.  Mildly depleted magnesium and potassium, will replete orally.  Chest x-ray with a degree of possible mild CHF. Clinically I don't believe this is significant, will ask him to follow with cardiology.  This is a shared visit with the attending physician who personally evaluated the patient and agrees with the care plan.   Reassess patient prior to discharge and he confirms no palpitations, chest pain or shortness of breath. I stressed to him the importance of follow-up for his hematuria even if he believes it to resolved visually. Patient verbalized  understanding and teach back technique.  Evaluation does not show pathology that would require ongoing emergent intervention or inpatient treatment. Pt is hemodynamically stable and mentating appropriately. Discussed findings and  plan with patient/guardian, who agrees with care plan. All questions answered. Return precautions discussed and outpatient follow up given.   Final Clinical Impressions(s) / ED Diagnoses   Final diagnoses:  Hematuria, unspecified type  Bilateral inguinal hernia without obstruction or gangrene, recurrence not specified  Atrial flutter, unspecified type [facility])    New Prescriptions Discharge Medication List as of 01/23/2017 12:56 PM    START taking these medications   Details  ADVAIR DISKUS 500-50 MCG/DOSE AEPB INHALE 1 PUFF TWO TIMES  DAILY, Normal    cephALEXin (KEFLEX) 500 MG capsule Take 1 capsule (500 mg total) by mouth 2 (two) times daily., Starting Mon 01/23/2017, Print         Alivia Cimino, [place], PA-C 01/23/17 1518    Gareth Morgan, MD 01/25/17 [phone]

## 2017-01-23 NOTE — ED Notes (Signed)
ED Provider at bedside. 

## 2017-01-23 NOTE — ED Triage Notes (Signed)
Pt reports hematuria x1 this morning. Also reports intermittent abdominal pain with ambulation hat he attributes to hx of hernia.

## 2017-01-23 NOTE — Telephone Encounter (Signed)
Reviewed chart. Pt currently in hospital.

## 2017-01-23 NOTE — Telephone Encounter (Signed)
-----   Message from [place], [place] sent at 01/22/2017 11:58 AM EDT ----- Regarding: Cardiac clearance Please inform patient that he is cleared for inguinal hernia surgery from cardiac perspective. He is moderate risk patient given comorbidities. Case discussed with Dr. Sallyanne Kuster  Thanks  Almyra Deforest

## 2017-01-23 NOTE — Telephone Encounter (Signed)
Sent fax note of clearance to Dr. Amie Portland office at numbers provided by [facility].  I informed pt that I would communicate w [facility] and they would contact him when ready to schedule.

## 2017-01-23 NOTE — ED Notes (Signed)
Patient transported to CT 

## 2017-01-24 ENCOUNTER — Telehealth (HOSPITAL_COMMUNITY): Payer: Self-pay | Admitting: *Deleted

## 2017-01-24 LAB — URINE CULTURE

## 2017-01-24 NOTE — Telephone Encounter (Signed)
LMOM for pt to clbk re: appt to see Roderic Palau, NP since recent ED visit.

## 2017-01-30 ENCOUNTER — Encounter: Payer: Self-pay | Admitting: Family Medicine

## 2017-01-30 ENCOUNTER — Ambulatory Visit (INDEPENDENT_AMBULATORY_CARE_PROVIDER_SITE_OTHER): Payer: Medicare Other | Admitting: Family Medicine

## 2017-01-30 DIAGNOSIS — R319 Hematuria, unspecified: Secondary | ICD-10-CM | POA: Insufficient documentation

## 2017-01-30 LAB — POCT URINALYSIS DIP (MANUAL ENTRY)
BILIRUBIN UA: NEGATIVE
BILIRUBIN UA: NEGATIVE mg/dL
Glucose, UA: NEGATIVE mg/dL
Nitrite, UA: NEGATIVE
PH UA: 5.5 (ref 5.0–8.0)
Protein Ur, POC: NEGATIVE mg/dL
Spec Grav, UA: 1.01 (ref 1.010–1.025)
Urobilinogen, UA: 0.2 E.U./dL

## 2017-01-30 LAB — POCT UA - MICROSCOPIC ONLY

## 2017-01-30 NOTE — Patient Instructions (Addendum)
Thank you for coming to see me today. It was a pleasure. Today we talked about:   Blood in your urine. We will repeat the urine analysis and if anything is abnormal, I will call you. We can also set up a referral to urology if you would like.  Please follow-up with me in 6 months, or sooner if you have any problems.   If you have any questions or concerns, please do not hesitate to call the office at [phone].  Take Care,   Donald Seleny Allbright, DO  Hematuria, Adult Hematuria is blood in your urine. It can be caused by a bladder infection, kidney infection, prostate infection, kidney stone, or cancer of your urinary tract. Infections can usually be treated with medicine, and a kidney stone usually will pass through your urine. If neither of these is the cause of your hematuria, further workup to find out the reason may be needed. It is very important that you tell your health care provider about any blood you see in your urine, even if the blood stops without treatment or happens without causing pain. Blood in your urine that happens and then stops and then happens again can be a symptom of a very serious condition. Also, pain is not a symptom in the initial stages of many urinary cancers. Follow these instructions at home:  Drink lots of fluid, 3-4 quarts a day. If you have been diagnosed with an infection, cranberry juice is especially recommended, in addition to large amounts of water.  Avoid caffeine, tea, and carbonated beverages because they tend to irritate the bladder.  Avoid alcohol because it may irritate the prostate.  Take all medicines as directed by your health care provider.  If you were prescribed an antibiotic medicine, finish it all even if you start to feel better.  If you have been diagnosed with a kidney stone, follow your health care provider's instructions regarding straining your urine to catch the stone.  Empty your bladder often. Avoid holding urine for long  periods of time.  After a bowel movement, women should cleanse front to back. Use each tissue only once.  Empty your bladder before and after sexual intercourse if you are a male. Contact a health care provider if:  You develop back pain.  You have a fever.  You have a feeling of sickness in your stomach (nausea) or vomiting.  Your symptoms are not better in 3 days. Return sooner if you are getting worse. Get help right away if:  You develop severe vomiting and are unable to keep the medicine down.  You develop severe back or abdominal pain despite taking your medicines.  You begin passing a large amount of blood or clots in your urine.  You feel extremely weak or faint, or you pass out. This information is not intended to replace advice given to you by your health care provider. Make sure you discuss any questions you have with your health care provider. Document Released: 05/02/2005 Document Revised: 10/08/2015 Document Reviewed: 12/31/2012 Elsevier Interactive Patient Education  2017 [employer].

## 2017-01-30 NOTE — Progress Notes (Signed)
   Subjective:    Patient ID: Donald Moreno, male    DOB: Dec 26, 1940, 76 y.o.   MRN: [id number]   CC: ER follow up  HPI:  Gross Hematuria: Patient reports no more blood in urine noticed since he went to the ED on 09/10 - Denies any dysuria, change in frequency ( pt reports he normally goes to the bathroom 10-155 per night and this has not ever bothered him), or trouble going - Denies recent weight loss or complaints of back, hip or pelvic pain. - Reports he has been complaint with all medications  - Has 2 more days of kelfex left from ED  Smoking status reviewed  Review of Systems  Per HPI, else denies recent illness, fever, headache, changes in vision, chest pain, shortness of breath, abdominal pain, N/V/D, weakness   Patient Active Problem List   Diagnosis Date Noted  . Hematuria 01/30/2017  . Advance care planning 08/01/2015  . Alcoholic cirrhosis of liver with ascites ([place])   . Typical atrial flutter ([place])   . Protein-calorie malnutrition, severe 07/02/2015  . CKD (chronic kidney disease) stage 3, GFR 30-59 ml/min 05/27/2015  . History of GI bleed 03/04/2015  . At risk for decreased bone density 03/04/2015  . Weight loss, unintentional 03/04/2015  . Inguinal hernia 10/28/2014  . Preventative health care 11/11/2011  . Asthma 01/29/2010  . NICOTINE ADDICTION 04/30/2007  . Gout 07/13/2006  . HYPERTENSION, BENIGN SYSTEMIC 07/13/2006     Objective:  BP 110/70   Pulse 88   Ht 5\' 10"  (1.778 m)   Wt 170 lb 12.8 oz (77.5 kg)   BMI 24.51 kg/m  Vitals and nursing note reviewed  General: NAD Cardiac: RRR, normal heart sounds, no murmurs. 2+ radial and PT pulses bilaterally Respiratory: CTAB, normal effort Abdomen: soft, nontender, nondistended, no hepatic or splenomegaly. Bowel sounds present Extremities: no edema or cyanosis. WWP. Skin: warm and dry, no rashes noted Neuro: alert and oriented, no focal deficits   Assessment & Plan:    Hematuria Patient with  recent gross hematuria which has resolved. Still with trace blood on UA. No signs of UTI. Obtained PSA 2.89 on 04/2008, now increased to 7.5 on 01/30/2017. Will refer to urology for further work up.    [place] Venesa Semidey, DO Family Medicine Resident PGY-1

## 2017-01-31 LAB — PSA: Prostate Specific Ag, Serum: 7.5 ng/mL — ABNORMAL HIGH (ref 0.0–4.0)

## 2017-01-31 NOTE — Assessment & Plan Note (Addendum)
Patient with recent gross hematuria which has resolved. Still with trace blood on UA. No signs of UTI. Obtained PSA 2.89 on 04/2008, now increased to 7.5 on 01/30/2017.   Will refer to urology for further work up.

## 2017-02-07 ENCOUNTER — Ambulatory Visit (HOSPITAL_COMMUNITY)
Admission: RE | Admit: 2017-02-07 | Discharge: 2017-02-07 | Disposition: A | Discharge status: Home / Self Care | Payer: Medicare Other | Source: Ambulatory Visit | Attending: Nurse Practitioner | Admitting: Nurse Practitioner

## 2017-02-07 VITALS — BP 116/64 | HR 97 | Ht 70.0 in | Wt 161.4 lb

## 2017-02-07 DIAGNOSIS — I130 Hypertensive heart and chronic kidney disease with heart failure and stage 1 through stage 4 chronic kidney disease, or unspecified chronic kidney disease: Secondary | ICD-10-CM | POA: Insufficient documentation

## 2017-02-07 DIAGNOSIS — Z7951 Long term (current) use of inhaled steroids: Secondary | ICD-10-CM | POA: Insufficient documentation

## 2017-02-07 DIAGNOSIS — Z8489 Family history of other specified conditions: Secondary | ICD-10-CM | POA: Insufficient documentation

## 2017-02-07 DIAGNOSIS — Z8249 Family history of ischemic heart disease and other diseases of the circulatory system: Secondary | ICD-10-CM | POA: Diagnosis not present

## 2017-02-07 DIAGNOSIS — I483 Typical atrial flutter: Secondary | ICD-10-CM

## 2017-02-07 DIAGNOSIS — K4090 Unilateral inguinal hernia, without obstruction or gangrene, not specified as recurrent: Secondary | ICD-10-CM | POA: Diagnosis not present

## 2017-02-07 DIAGNOSIS — I509 Heart failure, unspecified: Secondary | ICD-10-CM | POA: Diagnosis not present

## 2017-02-07 DIAGNOSIS — J45909 Unspecified asthma, uncomplicated: Secondary | ICD-10-CM | POA: Diagnosis not present

## 2017-02-07 DIAGNOSIS — I4892 Unspecified atrial flutter: Secondary | ICD-10-CM | POA: Insufficient documentation

## 2017-02-07 DIAGNOSIS — Z79899 Other long term (current) drug therapy: Secondary | ICD-10-CM | POA: Diagnosis not present

## 2017-02-07 DIAGNOSIS — Z825 Family history of asthma and other chronic lower respiratory diseases: Secondary | ICD-10-CM | POA: Diagnosis not present

## 2017-02-07 DIAGNOSIS — M109 Gout, unspecified: Secondary | ICD-10-CM | POA: Diagnosis not present

## 2017-02-07 DIAGNOSIS — R319 Hematuria, unspecified: Secondary | ICD-10-CM | POA: Insufficient documentation

## 2017-02-07 DIAGNOSIS — Z9889 Other specified postprocedural states: Secondary | ICD-10-CM | POA: Diagnosis not present

## 2017-02-07 DIAGNOSIS — K7460 Unspecified cirrhosis of liver: Secondary | ICD-10-CM | POA: Insufficient documentation

## 2017-02-07 NOTE — Progress Notes (Signed)
Primary Care Physician: Shirley, [place], DO Referring Physician: [facility] Cardiologist: Dr. Weber Cooks [place] is a 76 y.o. male with a h/o advanced liver cirrhosis with portal HTN and ascites, parenchymal insufficiency with spontaneously elevated prothrombin time, atrial flutter with RVR, reactive airway disease. He was treated with amiodarone for rate control and developed junctional bradycardia so the medication was stopped. He was recently started on bisoprolol for rate control. On June 3 he was hospitalized with an incarcerated umbilical hernia and underwent urgent surgery without complications. He has also undergone a TIPS procedure and no longer requires periodic paracentesis. He has a large right inguinal hernia . He is anticoagulated due to concerns re bleeding.  He recently presented to [facility] ER, 9/10, with gross hematuria, mildly depleted mag/K+, repleted orally. He was found to have UTI and because of aflutter with RVR in the 120's, he was asked to f/u in the afib clinic. Today, he states that he is improved, he does not see blood in the urine any longer, but has been told he will need to see a urologist. V rate today is 97. He was asked to f/u in the afib clinic.  Today, he denies symptoms of palpitations, chest pain, shortness of breath, orthopnea, PND, lower extremity edema, dizziness, presyncope, syncope, or neurologic sequela. The patient is tolerating medications without difficulties and is otherwise without complaint today.   Past Medical History:  Diagnosis Date  . Arthritis    "all over"  . Asthma   . Chronic kidney disease    "I see a kidney dr @ [facility]" (10/11/2016)  . Chronic lower back pain    "turned a truck over a long time ago" (10/11/2016)  . ETOH abuse   . Gout   . Hypertension   . Liver cirrhosis ([place]) 2015  . Pneumonia    "long long time ago" (10/11/2016)  . Spontaneous bacterial peritonitis ([place]) 07/01/2015   Past Surgical History:  Procedure  Laterality Date  . INSERTION OF MESH N/A 10/16/2016   Procedure: INSERTION OF MESH;  Surgeon: Kinsinger, Arta Bruce, MD;  Location: [place];  Service: General;  Laterality: N/A;  . IR PARACENTESIS  08/19/2016  . IR PARACENTESIS  08/26/2016  . IR PARACENTESIS  09/02/2016  . IR PARACENTESIS  09/09/2016  . IR PARACENTESIS  09/16/2016  . IR PARACENTESIS  10/03/2016  . IR PARACENTESIS  10/11/2016  . IR RADIOLOGIST EVAL & MGMT  09/29/2016  . IR TIPS  10/11/2016  . RADIOLOGY WITH ANESTHESIA N/A 10/11/2016   Procedure: TIPS;  Surgeon: Arne Cleveland, MD;  Location: [place];  Service: Radiology;  Laterality: N/A;  . UMBILICAL HERNIA REPAIR N/A 10/16/2016   Procedure: HERNIA REPAIR UMBILICAL ADULT;  Surgeon: Kinsinger, Arta Bruce, MD;  Location: [place];  Service: General;  Laterality: N/A;    Current Outpatient Prescriptions  Medication Sig Dispense Refill  . ADVAIR DISKUS 500-50 MCG/DOSE AEPB INHALE 1 PUFF TWO TIMES  DAILY 180 each 1  . allopurinol (ZYLOPRIM) 300 MG tablet Take 1 tablet (300 mg total) by mouth daily. 60 tablet 0  . bisoprolol (ZEBETA) 10 MG tablet Take 1 tablet (10 mg total) by mouth daily. 90 tablet 1  . Cholecalciferol (VITAMIN D-3) 5000 units TABS Take 1 tablet by mouth daily.    . ferrous sulfate 325 (65 FE) MG tablet Take 1 tablet (325 mg total) by mouth 2 (two) times daily with a meal. (Patient taking differently: Take 325 mg by mouth daily with breakfast. )  60 tablet 3  . furosemide (LASIX) 40 MG tablet Take 1 tablet by mouth daily.    Marland Kitchen lactulose (CHRONULAC) 10 GM/15ML solution Take 15 mLs (10 g total) by mouth 2 (two) times daily as needed (need to ensure 2 soft BM in a day.). 240 mL 0  . montelukast (SINGULAIR) 10 MG tablet TAKE 1 TABLET BY MOUTH AT  BEDTIME 90 tablet 0  . Nutritional Supplements (ENSURE COMPLETE SHAKE) LIQD Take 1 Container by mouth daily. 237 mL 11  . PROAIR HFA 108 (90 Base) MCG/ACT inhaler Use 2 puffs every 4 hours  as needed for wheezing or  shortness of breath 51 g 3    No current facility-administered medications for this encounter.     No Known Allergies  Social History   Social History  . Marital status: Divorced    Spouse name: N/A  . Number of children: 5  . Years of education: 10   Occupational History  . Retired- Administrator    Social History Main Topics  . Smoking status: Never Smoker  . Smokeless tobacco: Current User    Types: Snuff, Chew  . Alcohol use No     Comment: quit drinking "over 2 yr ago" (10/11/2016)  . Drug use: No  . Sexual activity: Not Currently   Other Topics Concern  . Not on file   Social History Narrative   Lives with nephew and niece in [place] home.     Tobacco: snuff all day (can last two days. Never smoked.       Hobbies: cooking, sleeping, going to store   Pets:  Dog, Bean    Family History  Problem Relation Age of Onset  . Other Mother        died when pt was only 61 - unknown cause.  . Other Father        deceased.  . Asthma Sister   . Heart disease Sister        multiple stents.  . Asthma Brother   . Hypertension Brother   . Asthma Son   . Asthma Brother   . Hypertension Brother     ROS- All systems are reviewed and negative except as per the HPI above  Physical Exam: Vitals:   02/07/17 0944  BP: 116/64  Pulse: 97  Weight: 161 lb 6.4 oz (73.2 kg)  Height: 5\' 10"  (1.778 m)   Wt Readings from Last 3 Encounters:  02/07/17 161 lb 6.4 oz (73.2 kg)  01/30/17 170 lb 12.8 oz (77.5 kg)  01/23/17 164 lb (74.4 kg)    Labs: Lab Results  Component Value Date   NA 138 01/23/2017   K 3.1 (L) 01/23/2017   CL 107 01/23/2017   CO2 23 01/23/2017   GLUCOSE 89 01/23/2017   BUN 15 01/23/2017   CREATININE 1.43 (H) 01/23/2017   CALCIUM 8.9 01/23/2017   PHOS 3.7 07/02/2015   MG 1.5 (L) 01/23/2017   Lab Results  Component Value Date   INR 1.45 01/23/2017   Lab Results  Component Value Date   CHOL 141 11/11/2011   HDL 71 11/11/2011   LDLCALC 44 11/11/2011   TRIG 128 11/11/2011       GEN- The patient is well appearing, alert and oriented x 3 today.   Head- normocephalic, atraumatic Eyes-  Sclera clear, conjunctiva pink Ears- hearing intact Oropharynx- clear Neck- supple, no JVP Lymph- no cervical lymphadenopathy Lungs- Clear to ausculation bilaterally, normal work of breathing Heart- irregular rate and  rhythm, no murmurs, rubs or gallops, PMI not laterally displaced GI- soft, NT, ND, + BS Extremities- no clubbing, cyanosis, or edema MS- no significant deformity or atrophy Skin- no rash or lesion Psych- euthymic mood, full affect Neuro- strength and sensation are intact  EKG-atrial flutter at 97 bpm, qrs int 80 ms, qtc 490 ms Epic records reviewed, including Dr.C's notes and ER records    Assessment and Plan: 1. Persistent atrial flutter I believe he has reasonable rate control today so will continue Bisoprolol 10 mg qd Amiodarone caused significant brady in the past He is not an anticoagulation candidate 2/2 liver disease  2. Hematuria Resolved per pt but he believes an urology visit is pending  3. Mild CHF on cxray His weight is actually down 9 lbs from 9/17 Clinically, do not note any extra fluid Continue wih lasix 40 mg daily  F/u with Dr. Loletha Grayer as scheduled 11/16  Butch Penny C. Stedman [place], [facility] [address] [place], [place] [address] [phone]

## 2017-02-07 NOTE — Progress Notes (Signed)
Thank you, Donna! MCr 

## 2017-02-19 ENCOUNTER — Other Ambulatory Visit: Payer: Self-pay | Admitting: Obstetrics and Gynecology

## 2017-02-23 ENCOUNTER — Ambulatory Visit: Payer: Self-pay | Admitting: General Surgery

## 2017-02-23 DIAGNOSIS — K4020 Bilateral inguinal hernia, without obstruction or gangrene, not specified as recurrent: Secondary | ICD-10-CM | POA: Diagnosis not present

## 2017-02-27 ENCOUNTER — Encounter: Payer: Self-pay | Admitting: Interventional Radiology

## 2017-03-07 ENCOUNTER — Other Ambulatory Visit: Payer: Self-pay | Admitting: Gastroenterology

## 2017-03-07 DIAGNOSIS — K7460 Unspecified cirrhosis of liver: Secondary | ICD-10-CM

## 2017-03-07 DIAGNOSIS — R1030 Lower abdominal pain, unspecified: Secondary | ICD-10-CM | POA: Diagnosis not present

## 2017-03-08 ENCOUNTER — Other Ambulatory Visit: Payer: Self-pay | Admitting: Family Medicine

## 2017-03-22 ENCOUNTER — Ambulatory Visit
Admission: RE | Admit: 2017-03-22 | Discharge: 2017-03-22 | Disposition: A | Discharge status: Home / Self Care | Payer: Medicare Other | Source: Ambulatory Visit | Attending: Gastroenterology | Admitting: Gastroenterology

## 2017-03-22 DIAGNOSIS — K8020 Calculus of gallbladder without cholecystitis without obstruction: Secondary | ICD-10-CM | POA: Diagnosis not present

## 2017-03-22 DIAGNOSIS — K7460 Unspecified cirrhosis of liver: Secondary | ICD-10-CM

## 2017-03-24 NOTE — Patient Instructions (Addendum)
Donald Moreno  03/24/2017   Your procedure is scheduled on: 03-30-17   Report to [facility] Main  Entrance Take [place]  Elevators to 3rd floor to [place] at 7:30 AM.    Call this number if you have problems the morning of surgery [phone]    Remember: ONLY 1 PERSON MAY GO WITH YOU TO SHORT STAY TO GET  READY MORNING OF [place].  Do not eat food or drink liquids :After Midnight.     Take these medicines the morning of surgery with A SIP OF WATER: Allopurinol (Zyloprim), and Bisoprolol (Zebeta). You may also bring and use your inhaler.                                You may not have any metal on your body including hair pins and              piercings  Do not wear jewelry, lotions, powders,  deodorant             Men may shave face and neck.   Do not bring valuables to the hospital. [place].  Contacts, dentures or bridgework may not be worn into surgery.  Leave suitcase in the car. After surgery it may be brought to your room.                  Please read over the following fact sheets you were given: _____________________________________________________________________             [facility] - Preparing for Surgery Before surgery, you can play an important role.  Because skin is not sterile, your skin needs to be as free of germs as possible.  You can reduce the number of germs on your skin by washing with CHG (chlorahexidine gluconate) soap before surgery.  CHG is an antiseptic cleaner which kills germs and bonds with the skin to continue killing germs even after washing. Please DO NOT use if you have an allergy to CHG or antibacterial soaps.  If your skin becomes reddened/irritated stop using the CHG and inform your nurse when you arrive at Short Stay. Do not shave (including legs and underarms) for at least 48 hours prior to the first CHG shower.  You may shave your face/neck. Please  follow these instructions carefully:  1.  Shower with CHG Soap the night before surgery and the  morning of Surgery.  2.  If you choose to wash your hair, wash your hair first as usual with your  normal  shampoo.  3.  After you shampoo, rinse your hair and body thoroughly to remove the  shampoo.                           4.  Use CHG as you would any other liquid soap.  You can apply chg directly  to the skin and wash                       Gently with a scrungie or clean washcloth.  5.  Apply the CHG Soap to your body ONLY FROM THE NECK DOWN.   Do not use on face/ open  Wound or open sores. Avoid contact with eyes, ears mouth and genitals (private parts).                       Wash face,  Genitals (private parts) with your normal soap.             6.  Wash thoroughly, paying special attention to the area where your surgery  will be performed.  7.  Thoroughly rinse your body with warm water from the neck down.  8.  DO NOT shower/wash with your normal soap after using and rinsing off  the CHG Soap.                9.  Pat yourself dry with a clean towel.            10.  Wear clean pajamas.            11.  Place clean sheets on your bed the night of your first shower and do not  sleep with pets. Day of Surgery : Do not apply any lotions/deodorants the morning of surgery.  Please wear clean clothes to the hospital/surgery center.  FAILURE TO FOLLOW THESE INSTRUCTIONS MAY RESULT IN THE CANCELLATION OF YOUR SURGERY PATIENT SIGNATURE_________________________________  NURSE SIGNATURE__________________________________  ________________________________________________________________________

## 2017-03-24 NOTE — Progress Notes (Signed)
02-07-17 (EPIC) EKG and Cardiology last office visit  01-23-17 (EPIC) CXR

## 2017-03-28 ENCOUNTER — Encounter (HOSPITAL_COMMUNITY): Payer: Self-pay

## 2017-03-28 ENCOUNTER — Other Ambulatory Visit: Payer: Self-pay

## 2017-03-28 ENCOUNTER — Encounter (HOSPITAL_COMMUNITY)
Admission: RE | Admit: 2017-03-28 | Discharge: 2017-03-28 | Disposition: A | Discharge status: Home / Self Care | Payer: Medicare Other | Source: Ambulatory Visit | Attending: General Surgery | Admitting: General Surgery

## 2017-03-28 DIAGNOSIS — D649 Anemia, unspecified: Secondary | ICD-10-CM | POA: Diagnosis not present

## 2017-03-28 DIAGNOSIS — M109 Gout, unspecified: Secondary | ICD-10-CM | POA: Diagnosis not present

## 2017-03-28 DIAGNOSIS — G8929 Other chronic pain: Secondary | ICD-10-CM | POA: Diagnosis not present

## 2017-03-28 DIAGNOSIS — N183 Chronic kidney disease, stage 3 (moderate): Secondary | ICD-10-CM | POA: Diagnosis not present

## 2017-03-28 DIAGNOSIS — R188 Other ascites: Secondary | ICD-10-CM | POA: Diagnosis not present

## 2017-03-28 DIAGNOSIS — K4020 Bilateral inguinal hernia, without obstruction or gangrene, not specified as recurrent: Secondary | ICD-10-CM | POA: Diagnosis not present

## 2017-03-28 DIAGNOSIS — M545 Low back pain: Secondary | ICD-10-CM | POA: Diagnosis not present

## 2017-03-28 DIAGNOSIS — F17290 Nicotine dependence, other tobacco product, uncomplicated: Secondary | ICD-10-CM | POA: Diagnosis not present

## 2017-03-28 DIAGNOSIS — F1010 Alcohol abuse, uncomplicated: Secondary | ICD-10-CM | POA: Diagnosis not present

## 2017-03-28 DIAGNOSIS — Z79899 Other long term (current) drug therapy: Secondary | ICD-10-CM | POA: Diagnosis not present

## 2017-03-28 DIAGNOSIS — J45909 Unspecified asthma, uncomplicated: Secondary | ICD-10-CM | POA: Diagnosis not present

## 2017-03-28 DIAGNOSIS — M1990 Unspecified osteoarthritis, unspecified site: Secondary | ICD-10-CM | POA: Diagnosis not present

## 2017-03-28 DIAGNOSIS — I129 Hypertensive chronic kidney disease with stage 1 through stage 4 chronic kidney disease, or unspecified chronic kidney disease: Secondary | ICD-10-CM | POA: Diagnosis not present

## 2017-03-28 DIAGNOSIS — K7460 Unspecified cirrhosis of liver: Secondary | ICD-10-CM | POA: Diagnosis not present

## 2017-03-28 DIAGNOSIS — D696 Thrombocytopenia, unspecified: Secondary | ICD-10-CM | POA: Diagnosis not present

## 2017-03-28 LAB — CBC WITH DIFFERENTIAL/PLATELET
BASOS ABS: 0 10*3/uL (ref 0.0–0.1)
Basophils Relative: 1 %
EOS ABS: 0.2 10*3/uL (ref 0.0–0.7)
Eosinophils Relative: 4 %
HCT: 35.4 % — ABNORMAL LOW (ref 39.0–52.0)
HEMOGLOBIN: 11.8 g/dL — AB (ref 13.0–17.0)
LYMPHS PCT: 37 %
Lymphs Abs: 2.1 10*3/uL (ref 0.7–4.0)
MCH: 35.3 pg — ABNORMAL HIGH (ref 26.0–34.0)
MCHC: 33.3 g/dL (ref 30.0–36.0)
MCV: 106 fL — ABNORMAL HIGH (ref 78.0–100.0)
Monocytes Absolute: 0.6 10*3/uL (ref 0.1–1.0)
Monocytes Relative: 11 %
NEUTROS PCT: 47 %
Neutro Abs: 2.7 10*3/uL (ref 1.7–7.7)
PLATELETS: 121 10*3/uL — AB (ref 150–400)
RBC: 3.34 MIL/uL — AB (ref 4.22–5.81)
RDW: 14.6 % (ref 11.5–15.5)
WBC: 5.7 10*3/uL (ref 4.0–10.5)

## 2017-03-28 LAB — COMPREHENSIVE METABOLIC PANEL
ALBUMIN: 3.4 g/dL — AB (ref 3.5–5.0)
ALT: 23 U/L (ref 17–63)
AST: 40 U/L (ref 15–41)
Alkaline Phosphatase: 84 U/L (ref 38–126)
Anion gap: 7 (ref 5–15)
BUN: 35 mg/dL — AB (ref 6–20)
CHLORIDE: 106 mmol/L (ref 101–111)
CO2: 26 mmol/L (ref 22–32)
CREATININE: 1.88 mg/dL — AB (ref 0.61–1.24)
Calcium: 9.7 mg/dL (ref 8.9–10.3)
GFR calc Af Amer: 39 mL/min — ABNORMAL LOW (ref >60)
GFR calc non Af Amer: 34 mL/min — ABNORMAL LOW (ref >60)
GLUCOSE: 82 mg/dL (ref 65–99)
Potassium: 4.2 mmol/L (ref 3.5–5.1)
SODIUM: 139 mmol/L (ref 135–145)
Total Bilirubin: 1.7 mg/dL — ABNORMAL HIGH (ref 0.3–1.2)
Total Protein: 7.5 g/dL (ref 6.5–8.1)

## 2017-03-28 LAB — APTT: APTT: 41 s — AB (ref 24–36)

## 2017-03-28 LAB — PROTIME-INR
INR: 1.35
Prothrombin Time: 16.5 seconds — ABNORMAL HIGH (ref 11.4–15.2)

## 2017-03-28 LAB — ABO/RH: ABO/RH(D): AB POS

## 2017-03-28 NOTE — Progress Notes (Signed)
03-28-17 CBC w/Diff, CMP, PT, PTT results routed to Dr. Kieth Brightly for review.

## 2017-03-30 ENCOUNTER — Ambulatory Visit (HOSPITAL_COMMUNITY): Payer: Medicare Other | Admitting: Anesthesiology

## 2017-03-30 ENCOUNTER — Encounter (HOSPITAL_COMMUNITY)
Admission: RE | Disposition: A | Discharge status: Home / Self Care | Payer: Self-pay | Source: Ambulatory Visit | Attending: General Surgery

## 2017-03-30 ENCOUNTER — Observation Stay (HOSPITAL_COMMUNITY)
Admission: RE | Admit: 2017-03-30 | Discharge: 2017-03-31 | Disposition: A | Discharge status: Home / Self Care | Payer: Medicare Other | Source: Ambulatory Visit | Attending: General Surgery | Admitting: General Surgery

## 2017-03-30 ENCOUNTER — Encounter (HOSPITAL_COMMUNITY): Payer: Self-pay | Admitting: Emergency Medicine

## 2017-03-30 ENCOUNTER — Other Ambulatory Visit: Payer: Self-pay

## 2017-03-30 DIAGNOSIS — K7460 Unspecified cirrhosis of liver: Secondary | ICD-10-CM | POA: Diagnosis not present

## 2017-03-30 DIAGNOSIS — G8929 Other chronic pain: Secondary | ICD-10-CM | POA: Diagnosis not present

## 2017-03-30 DIAGNOSIS — D696 Thrombocytopenia, unspecified: Secondary | ICD-10-CM | POA: Diagnosis not present

## 2017-03-30 DIAGNOSIS — K4020 Bilateral inguinal hernia, without obstruction or gangrene, not specified as recurrent: Principal | ICD-10-CM | POA: Diagnosis present

## 2017-03-30 DIAGNOSIS — F1010 Alcohol abuse, uncomplicated: Secondary | ICD-10-CM | POA: Insufficient documentation

## 2017-03-30 DIAGNOSIS — Z79899 Other long term (current) drug therapy: Secondary | ICD-10-CM | POA: Insufficient documentation

## 2017-03-30 DIAGNOSIS — J45909 Unspecified asthma, uncomplicated: Secondary | ICD-10-CM | POA: Diagnosis not present

## 2017-03-30 DIAGNOSIS — M1990 Unspecified osteoarthritis, unspecified site: Secondary | ICD-10-CM | POA: Insufficient documentation

## 2017-03-30 DIAGNOSIS — F17290 Nicotine dependence, other tobacco product, uncomplicated: Secondary | ICD-10-CM | POA: Insufficient documentation

## 2017-03-30 DIAGNOSIS — R188 Other ascites: Secondary | ICD-10-CM | POA: Diagnosis not present

## 2017-03-30 DIAGNOSIS — M545 Low back pain: Secondary | ICD-10-CM | POA: Insufficient documentation

## 2017-03-30 DIAGNOSIS — N183 Chronic kidney disease, stage 3 (moderate): Secondary | ICD-10-CM | POA: Diagnosis not present

## 2017-03-30 DIAGNOSIS — G8918 Other acute postprocedural pain: Secondary | ICD-10-CM | POA: Diagnosis not present

## 2017-03-30 DIAGNOSIS — M109 Gout, unspecified: Secondary | ICD-10-CM | POA: Diagnosis not present

## 2017-03-30 DIAGNOSIS — D649 Anemia, unspecified: Secondary | ICD-10-CM | POA: Insufficient documentation

## 2017-03-30 DIAGNOSIS — I129 Hypertensive chronic kidney disease with stage 1 through stage 4 chronic kidney disease, or unspecified chronic kidney disease: Secondary | ICD-10-CM | POA: Diagnosis not present

## 2017-03-30 HISTORY — PX: INGUINAL HERNIA REPAIR: SHX194

## 2017-03-30 HISTORY — PX: INSERTION OF MESH: SHX5868

## 2017-03-30 LAB — TYPE AND SCREEN
ABO/RH(D): AB POS
Antibody Screen: NEGATIVE

## 2017-03-30 SURGERY — REPAIR, HERNIA, INGUINAL, ADULT
Anesthesia: General | Laterality: Bilateral

## 2017-03-30 MED ORDER — MIDAZOLAM HCL 2 MG/2ML IJ SOLN
INTRAMUSCULAR | Status: AC
  Filled 2017-03-30: qty 2

## 2017-03-30 MED ORDER — VITAMIN D3 25 MCG (1000 UNIT) PO TABS
5000.0000 [IU] | ORAL_TABLET | Freq: Every day | ORAL | Status: DC
  Filled 2017-03-30 (×2): qty 5

## 2017-03-30 MED ORDER — TRAMADOL HCL 50 MG PO TABS: 50.0000 mg | ORAL_TABLET | Freq: Four times a day (QID) | ORAL | Status: DC | PRN

## 2017-03-30 MED ORDER — PROPOFOL 10 MG/ML IV BOLUS
INTRAVENOUS | Status: AC
  Filled 2017-03-30: qty 20

## 2017-03-30 MED ORDER — BISOPROLOL FUMARATE 5 MG PO TABS: 10.0000 mg | ORAL_TABLET | Freq: Every day | ORAL | Status: DC

## 2017-03-30 MED ORDER — ALBUTEROL SULFATE (2.5 MG/3ML) 0.083% IN NEBU: 2.5000 mg | INHALATION_SOLUTION | RESPIRATORY_TRACT | Status: DC | PRN

## 2017-03-30 MED ORDER — OXYCODONE HCL 5 MG PO TABS: 5.0000 mg | ORAL_TABLET | ORAL | Status: DC | PRN

## 2017-03-30 MED ORDER — MEPERIDINE HCL 50 MG/ML IJ SOLN: 6.2500 mg | INTRAMUSCULAR | Status: DC | PRN

## 2017-03-30 MED ORDER — GABAPENTIN 300 MG PO CAPS
300.0000 mg | ORAL_CAPSULE | ORAL | Status: AC
  Administered 2017-03-30: 300 mg via ORAL
  Filled 2017-03-30: qty 1

## 2017-03-30 MED ORDER — MONTELUKAST SODIUM 10 MG PO TABS
10.0000 mg | ORAL_TABLET | Freq: Every day | ORAL | Status: DC
  Administered 2017-03-30: 10 mg via ORAL
  Filled 2017-03-30: qty 1

## 2017-03-30 MED ORDER — MORPHINE SULFATE (PF) 2 MG/ML IV SOLN: 2.0000 mg | INTRAVENOUS | Status: DC | PRN

## 2017-03-30 MED ORDER — DIPHENHYDRAMINE HCL 50 MG/ML IJ SOLN: 12.5000 mg | Freq: Four times a day (QID) | INTRAMUSCULAR | Status: DC | PRN

## 2017-03-30 MED ORDER — FENTANYL CITRATE (PF) 100 MCG/2ML IJ SOLN: 25.0000 ug | INTRAMUSCULAR | Status: DC | PRN

## 2017-03-30 MED ORDER — FENTANYL CITRATE (PF) 250 MCG/5ML IJ SOLN
INTRAMUSCULAR | Status: AC
  Filled 2017-03-30: qty 5

## 2017-03-30 MED ORDER — OXYCODONE HCL 5 MG/5ML PO SOLN
5.0000 mg | Freq: Once | ORAL | Status: DC | PRN
  Filled 2017-03-30: qty 5

## 2017-03-30 MED ORDER — GLYCOPYRROLATE 0.2 MG/ML IJ SOLN
INTRAMUSCULAR | Status: DC | PRN
  Administered 2017-03-30: 0.2 mg via INTRAVENOUS

## 2017-03-30 MED ORDER — ROCURONIUM BROMIDE 100 MG/10ML IV SOLN
INTRAVENOUS | Status: DC | PRN
  Administered 2017-03-30: 5 mg via INTRAVENOUS

## 2017-03-30 MED ORDER — LIDOCAINE HCL (CARDIAC) 20 MG/ML IV SOLN
INTRAVENOUS | Status: DC | PRN
  Administered 2017-03-30: 50 mg via INTRAVENOUS

## 2017-03-30 MED ORDER — FENTANYL CITRATE (PF) 100 MCG/2ML IJ SOLN
INTRAMUSCULAR | Status: AC
  Administered 2017-03-30: 50 ug via INTRAVENOUS
  Filled 2017-03-30: qty 2

## 2017-03-30 MED ORDER — CHLORHEXIDINE GLUCONATE CLOTH 2 % EX PADS: 6.0000 | MEDICATED_PAD | Freq: Once | CUTANEOUS | Status: DC

## 2017-03-30 MED ORDER — SODIUM CHLORIDE 0.9 % IR SOLN
Status: DC | PRN
  Administered 2017-03-30: 1000 mL

## 2017-03-30 MED ORDER — FENTANYL CITRATE (PF) 100 MCG/2ML IJ SOLN
100.0000 ug | Freq: Once | INTRAMUSCULAR | Status: AC
  Administered 2017-03-30: 50 ug via INTRAVENOUS

## 2017-03-30 MED ORDER — ONDANSETRON HCL 4 MG/2ML IJ SOLN
INTRAMUSCULAR | Status: AC
  Filled 2017-03-30: qty 2

## 2017-03-30 MED ORDER — PROPOFOL 10 MG/ML IV BOLUS
INTRAVENOUS | Status: DC | PRN
  Administered 2017-03-30: 170 mg via INTRAVENOUS
  Administered 2017-03-30: 30 mg via INTRAVENOUS

## 2017-03-30 MED ORDER — DEXAMETHASONE SODIUM PHOSPHATE 10 MG/ML IJ SOLN
INTRAMUSCULAR | Status: AC
  Filled 2017-03-30: qty 1

## 2017-03-30 MED ORDER — POLYETHYLENE GLYCOL 3350 17 G PO PACK: 17.0000 g | PACK | Freq: Every day | ORAL | Status: DC | PRN

## 2017-03-30 MED ORDER — FERROUS SULFATE 325 (65 FE) MG PO TABS
325.0000 mg | ORAL_TABLET | Freq: Two times a day (BID) | ORAL | Status: DC
  Administered 2017-03-30 – 2017-03-31 (×2): 325 mg via ORAL
  Filled 2017-03-30 (×2): qty 1

## 2017-03-30 MED ORDER — MOMETASONE FURO-FORMOTEROL FUM 200-5 MCG/ACT IN AERO
2.0000 | INHALATION_SPRAY | Freq: Two times a day (BID) | RESPIRATORY_TRACT | Status: DC
  Administered 2017-03-31: 2 via RESPIRATORY_TRACT
  Filled 2017-03-30: qty 8.8

## 2017-03-30 MED ORDER — MIDAZOLAM HCL 5 MG/5ML IJ SOLN
INTRAMUSCULAR | Status: DC | PRN
  Administered 2017-03-30: 0.5 mg via INTRAVENOUS

## 2017-03-30 MED ORDER — ONDANSETRON HCL 4 MG/2ML IJ SOLN: 4.0000 mg | Freq: Four times a day (QID) | INTRAMUSCULAR | Status: DC | PRN

## 2017-03-30 MED ORDER — HYDRALAZINE HCL 20 MG/ML IJ SOLN: 10.0000 mg | INTRAMUSCULAR | Status: DC | PRN

## 2017-03-30 MED ORDER — SODIUM CHLORIDE 0.9 % IV SOLN
INTRAVENOUS | Status: DC | PRN
  Administered 2017-03-30: 10:00:00 via INTRAVENOUS

## 2017-03-30 MED ORDER — METOCLOPRAMIDE HCL 5 MG/ML IJ SOLN: 10.0000 mg | Freq: Once | INTRAMUSCULAR | Status: DC | PRN

## 2017-03-30 MED ORDER — SUCCINYLCHOLINE CHLORIDE 20 MG/ML IJ SOLN
INTRAMUSCULAR | Status: DC | PRN
  Administered 2017-03-30: 120 mg via INTRAVENOUS

## 2017-03-30 MED ORDER — CEFAZOLIN SODIUM-DEXTROSE 2-4 GM/100ML-% IV SOLN
2.0000 g | INTRAVENOUS | Status: AC
  Administered 2017-03-30: 2 g via INTRAVENOUS

## 2017-03-30 MED ORDER — PHENYLEPHRINE HCL 10 MG/ML IJ SOLN
INTRAMUSCULAR | Status: DC | PRN
  Administered 2017-03-30: 20 ug/min via INTRAVENOUS

## 2017-03-30 MED ORDER — ALLOPURINOL 300 MG PO TABS: 300.0000 mg | ORAL_TABLET | Freq: Every day | ORAL | Status: DC

## 2017-03-30 MED ORDER — ONDANSETRON 4 MG PO TBDP: 4.0000 mg | ORAL_TABLET | Freq: Four times a day (QID) | ORAL | Status: DC | PRN

## 2017-03-30 MED ORDER — ROPIVACAINE HCL 5 MG/ML IJ SOLN
INTRAMUSCULAR | Status: DC | PRN
  Administered 2017-03-30: 30 mL via PERINEURAL

## 2017-03-30 MED ORDER — LACTATED RINGERS IV SOLN
INTRAVENOUS | Status: DC
  Administered 2017-03-30: 08:00:00 via INTRAVENOUS

## 2017-03-30 MED ORDER — FENTANYL CITRATE (PF) 100 MCG/2ML IJ SOLN
INTRAMUSCULAR | Status: DC | PRN
  Administered 2017-03-30: 25 ug via INTRAVENOUS
  Administered 2017-03-30: 50 ug via INTRAVENOUS
  Administered 2017-03-30: 25 ug via INTRAVENOUS

## 2017-03-30 MED ORDER — DIPHENHYDRAMINE HCL 12.5 MG/5ML PO ELIX: 12.5000 mg | ORAL_SOLUTION | Freq: Four times a day (QID) | ORAL | Status: DC | PRN

## 2017-03-30 MED ORDER — SODIUM CHLORIDE 0.9 % IV SOLN
INTRAVENOUS | Status: DC
  Administered 2017-03-30: 15:00:00 via INTRAVENOUS

## 2017-03-30 MED ORDER — MIDAZOLAM HCL 2 MG/2ML IJ SOLN: 2.0000 mg | Freq: Once | INTRAMUSCULAR | Status: DC

## 2017-03-30 MED ORDER — OXYCODONE HCL 5 MG PO TABS: 5.0000 mg | ORAL_TABLET | Freq: Once | ORAL | Status: DC | PRN

## 2017-03-30 MED ORDER — FUROSEMIDE 40 MG PO TABS
40.0000 mg | ORAL_TABLET | Freq: Every day | ORAL | Status: DC
  Administered 2017-03-30: 40 mg via ORAL
  Filled 2017-03-30: qty 1

## 2017-03-30 MED ORDER — CISATRACURIUM BESYLATE 20 MG/10ML IV SOLN
INTRAVENOUS | Status: AC
  Filled 2017-03-30: qty 10

## 2017-03-30 SURGICAL SUPPLY — 44 items
APL SKNCLS STERI-STRIP NONHPOA (GAUZE/BANDAGES/DRESSINGS) ×2
BENZOIN TINCTURE PRP APPL 2/3 (GAUZE/BANDAGES/DRESSINGS) ×5 IMPLANT
BLADE SURG 15 STRL LF DISP TIS (BLADE) ×1 IMPLANT
BLADE SURG 15 STRL SS (BLADE) ×3
CELLS DAT CNTRL 66122 CELL SVR (MISCELLANEOUS) IMPLANT
CHLORAPREP W/TINT 26ML (MISCELLANEOUS) ×3 IMPLANT
CLOSURE WOUND 1/2 X4 (GAUZE/BANDAGES/DRESSINGS) ×2
COVER SURGICAL LIGHT HANDLE (MISCELLANEOUS) ×3 IMPLANT
DECANTER SPIKE VIAL GLASS SM (MISCELLANEOUS) ×3 IMPLANT
DRAIN PENROSE 18X1/2 LTX STRL (DRAIN) ×2 IMPLANT
DRAPE LAPAROTOMY TRNSV 102X78 (DRAPE) ×3 IMPLANT
DRSG TEGADERM 4X4.75 (GAUZE/BANDAGES/DRESSINGS) ×4 IMPLANT
ELECT PENCIL ROCKER SW 15FT (MISCELLANEOUS) ×3 IMPLANT
ELECT REM PT RETURN 15FT ADLT (MISCELLANEOUS) ×3 IMPLANT
GAUZE SPONGE 4X4 12PLY STRL (GAUZE/BANDAGES/DRESSINGS) ×2 IMPLANT
GLOVE BIOGEL PI IND STRL 7.0 (GLOVE) ×1 IMPLANT
GLOVE BIOGEL PI INDICATOR 7.0 (GLOVE) ×12
GLOVE SURG SS PI 7.0 STRL IVOR (GLOVE) ×3 IMPLANT
GOWN STRL REUS W/TWL LRG LVL3 (GOWN DISPOSABLE) ×3 IMPLANT
GOWN STRL REUS W/TWL XL LVL3 (GOWN DISPOSABLE) ×5 IMPLANT
KIT BASIN OR (CUSTOM PROCEDURE TRAY) ×3 IMPLANT
MESH ULTRAPRO 6X6 15CM15CM (Mesh General) ×2 IMPLANT
NEEDLE HYPO 22GX1.5 SAFETY (NEEDLE) ×1 IMPLANT
PACK BASIC VI WITH GOWN DISP (CUSTOM PROCEDURE TRAY) ×3 IMPLANT
RETRACTOR WND ALEXIS 18 MED (MISCELLANEOUS) IMPLANT
RETRACTOR WND ALEXIS 25 LRG (MISCELLANEOUS) IMPLANT
RTRCTR WOUND ALEXIS 18CM MED (MISCELLANEOUS)
RTRCTR WOUND ALEXIS 25CM LRG (MISCELLANEOUS)
SPONGE LAP 18X18 X RAY DECT (DISPOSABLE) ×4 IMPLANT
SPONGE LAP 4X18 X RAY DECT (DISPOSABLE) ×7 IMPLANT
STRIP CLOSURE SKIN 1/2X4 (GAUZE/BANDAGES/DRESSINGS) ×2 IMPLANT
SUCTION POOLE TIP (SUCTIONS) ×2 IMPLANT
SUT MNCRL AB 4-0 PS2 18 (SUTURE) ×5 IMPLANT
SUT PROLENE 2 0 CT2 30 (SUTURE) ×10 IMPLANT
SUT SILK 2 0 SH CR/8 (SUTURE) IMPLANT
SUT VIC AB 2-0 CT1 27 (SUTURE) ×12
SUT VIC AB 2-0 CT1 TAPERPNT 27 (SUTURE) ×1 IMPLANT
SUT VIC AB 3-0 SH 27 (SUTURE) ×3
SUT VIC AB 3-0 SH 27XBRD (SUTURE) ×1 IMPLANT
SYR BULB IRRIGATION 50ML (SYRINGE) ×3 IMPLANT
SYR CONTROL 10ML LL (SYRINGE) ×1 IMPLANT
TOWEL OR 17X26 10 PK STRL BLUE (TOWEL DISPOSABLE) ×3 IMPLANT
TOWEL OR NON WOVEN STRL DISP B (DISPOSABLE) ×3 IMPLANT
YANKAUER SUCT BULB TIP 10FT TU (MISCELLANEOUS) ×3 IMPLANT

## 2017-03-30 NOTE — Anesthesia Procedure Notes (Signed)
Procedure Name: Intubation Date/Time: 03/30/2017 9:13 AM Performed by: Lissa Morales, CRNA Pre-anesthesia Checklist: Patient identified, Emergency Drugs available, Suction available and Patient being monitored Patient Re-evaluated:Patient Re-evaluated prior to induction Oxygen Delivery Method: Circle system utilized Preoxygenation: Pre-oxygenation with 100% oxygen Induction Type: IV induction Ventilation: Mask ventilation without difficulty Laryngoscope Size: Mac and 4 Grade View: Grade I Tube type: Oral Tube size: 7.5 mm Number of attempts: 1 Airway Equipment and Method: Stylet and Oral airway Placement Confirmation: ETT inserted through vocal cords under direct vision,  positive ETCO2 and breath sounds checked- equal and bilateral Secured at: 22 cm Tube secured with: Tape Dental Injury: Teeth and Oropharynx as per pre-operative assessment

## 2017-03-30 NOTE — Anesthesia Postprocedure Evaluation (Signed)
Anesthesia Post Note  Patient: Donald Moreno  Procedure(s) Performed: OPEN BILATERAL INGUINAL HERNIA REPAIR WITH MESH (Bilateral ) INSERTION OF MESH (Bilateral )     Patient location during evaluation: PACU Anesthesia Type: General Level of consciousness: awake and alert Pain management: pain level controlled Vital Signs Assessment: post-procedure vital signs reviewed and stable Respiratory status: spontaneous breathing, nonlabored ventilation, respiratory function stable and patient connected to nasal cannula oxygen Cardiovascular status: blood pressure returned to baseline and stable Postop Assessment: no apparent nausea or vomiting Anesthetic complications: no Comments: CRNA did not administer or chart prescribed anti-emetics.    Last Vitals:  Vitals:   03/30/17 1300 03/30/17 1416  BP: (!) 148/59 (!) 138/58  Pulse: (!) 44 (!) 42  Resp: 15 16  Temp: 36.9 C 37.1 C  SpO2: 98% 100%    Last Pain:  Vitals:   03/30/17 1416  TempSrc: Oral  PainSc:                  [name]

## 2017-03-30 NOTE — H&P (Signed)
Donald Moreno is an 76 y.o. male.   Chief Complaint: hernias HPI: 76 yo male with long history of cirrhosis presents with bilateral large inguinal hernias that are causing daily discomfort and impeding his ability to walk.  Past Medical History:  Diagnosis Date  . Arthritis    "all over"  . Asthma   . Chronic kidney disease    "I see a kidney dr @ [facility]" (10/11/2016)  . Chronic lower back pain    "turned a truck over a long time ago" (10/11/2016)  . ETOH abuse   . Gout   . Hypertension   . Liver cirrhosis ([place]) 2015  . Pneumonia    "long long time ago" (10/11/2016)  . Spontaneous bacterial peritonitis ([place]) 07/01/2015    Past Surgical History:  Procedure Laterality Date  . INSERTION OF MESH N/A 10/16/2016   Procedure: INSERTION OF MESH;  Surgeon: Arsalan Brisbin, Arta Bruce, MD;  Location: [place];  Service: General;  Laterality: N/A;  . IR PARACENTESIS  08/19/2016  . IR PARACENTESIS  08/26/2016  . IR PARACENTESIS  09/02/2016  . IR PARACENTESIS  09/09/2016  . IR PARACENTESIS  09/16/2016  . IR PARACENTESIS  10/03/2016  . IR PARACENTESIS  10/11/2016  . IR RADIOLOGIST EVAL & MGMT  09/29/2016  . IR RADIOLOGIST EVAL & MGMT  12/21/2016  . IR TIPS  10/11/2016  . RADIOLOGY WITH ANESTHESIA N/A 10/11/2016   Procedure: TIPS;  Surgeon: Arne Cleveland, MD;  Location: [place];  Service: Radiology;  Laterality: N/A;  . UMBILICAL HERNIA REPAIR N/A 10/16/2016   Procedure: HERNIA REPAIR UMBILICAL ADULT;  Surgeon: Rosalena Mccorry, Arta Bruce, MD;  Location: [place] ;  Service: General;  Laterality: N/A;    Family History  Problem Relation Age of Onset  . Other Mother        died when pt was only 22 - unknown cause.  . Other Father        deceased.  . Asthma Sister   . Heart disease Sister        multiple stents.  . Asthma Brother   . Hypertension Brother   . Asthma Son   . Asthma Brother   . Hypertension Brother    Social History:  reports that  has never smoked. His smokeless tobacco use includes snuff and  chew. He reports that he does not drink alcohol or use drugs.  Allergies: No Known Allergies  Medications Prior to Admission  Medication Sig Dispense Refill  . ADVAIR DISKUS 500-50 MCG/DOSE AEPB INHALE 1 PUFF TWO TIMES  DAILY 180 each 1  . allopurinol (ZYLOPRIM) 300 MG tablet TAKE 1 TABLET BY MOUTH  DAILY (Patient taking differently: TAKE 300 MG BY MOUTH  DAILY) 60 tablet 1  . bisoprolol (ZEBETA) 10 MG tablet Take 1 tablet (10 mg total) by mouth daily. 90 tablet 1  . Cholecalciferol (VITAMIN D-3) 5000 units TABS Take 5,000 Units daily by mouth.     . ferrous sulfate 325 (65 FE) MG tablet Take 1 tablet (325 mg total) by mouth 2 (two) times daily with a meal. 60 tablet 3  . furosemide (LASIX) 40 MG tablet Take 40 mg daily by mouth.     . montelukast (SINGULAIR) 10 MG tablet TAKE 1 TABLET BY MOUTH AT  BEDTIME (Patient taking differently: TAKE 10 MG BY MOUTH AT  BEDTIME) 90 tablet 1  . Nutritional Supplements (ENSURE COMPLETE SHAKE) LIQD Take 1 Container by mouth daily. (Patient taking differently: Take 1 Container 2 (two) times daily by  mouth. ) 237 mL 11  . PROAIR HFA 108 (90 Base) MCG/ACT inhaler Use 2 puffs every 4 hours  as needed for wheezing or  shortness of breath 51 g 3  . lactulose (CHRONULAC) 10 GM/15ML solution Take 15 mLs (10 g total) by mouth 2 (two) times daily as needed (need to ensure 2 soft BM in a day.). (Patient not taking: Reported on 03/20/2017) 240 mL 0    Results for orders placed or performed during the hospital encounter of 03/28/17 (from the past 48 hour(s))  Type and screen     Status: None   Collection Time: 03/28/17 11:33 AM  Result Value Ref Range   ABO/RH(D) AB POS    Antibody Screen NEG    Sample Expiration 04/02/2017    Extend sample reason NO TRANSFUSIONS OR PREGNANCY IN THE PAST 3 MONTHS   ABO/Rh     Status: None   Collection Time: 03/28/17 11:33 AM  Result Value Ref Range   ABO/RH(D) AB POS   APTT     Status: Abnormal   Collection Time: 03/28/17 11:38 AM   Result Value Ref Range   aPTT 41 (H) 24 - 36 seconds    Comment:        IF BASELINE aPTT IS ELEVATED, SUGGEST PATIENT RISK ASSESSMENT BE USED TO DETERMINE APPROPRIATE ANTICOAGULANT THERAPY.   CBC WITH DIFFERENTIAL     Status: Abnormal   Collection Time: 03/28/17 11:38 AM  Result Value Ref Range   WBC 5.7 4.0 - 10.5 K/uL   RBC 3.34 (L) 4.22 - 5.81 MIL/uL   Hemoglobin 11.8 (L) 13.0 - 17.0 g/dL   HCT 35.4 (L) 39.0 - 52.0 %   MCV 106.0 (H) 78.0 - 100.0 fL   MCH 35.3 (H) 26.0 - 34.0 pg   MCHC 33.3 30.0 - 36.0 g/dL   RDW 14.6 11.5 - 15.5 %   Platelets 121 (L) 150 - 400 K/uL   Neutrophils Relative % 47 %   Neutro Abs 2.7 1.7 - 7.7 K/uL   Lymphocytes Relative 37 %   Lymphs Abs 2.1 0.7 - 4.0 K/uL   Monocytes Relative 11 %   Monocytes Absolute 0.6 0.1 - 1.0 K/uL   Eosinophils Relative 4 %   Eosinophils Absolute 0.2 0.0 - 0.7 K/uL   Basophils Relative 1 %   Basophils Absolute 0.0 0.0 - 0.1 K/uL  Comprehensive metabolic panel     Status: Abnormal   Collection Time: 03/28/17 11:38 AM  Result Value Ref Range   Sodium 139 135 - 145 mmol/L   Potassium 4.2 3.5 - 5.1 mmol/L   Chloride 106 101 - 111 mmol/L   CO2 26 22 - 32 mmol/L   Glucose, Bld 82 65 - 99 mg/dL   BUN 35 (H) 6 - 20 mg/dL   Creatinine, Ser 1.88 (H) 0.61 - 1.24 mg/dL   Calcium 9.7 8.9 - 10.3 mg/dL   Total Protein 7.5 6.5 - 8.1 g/dL   Albumin 3.4 (L) 3.5 - 5.0 g/dL   AST 40 15 - 41 U/L   ALT 23 17 - 63 U/L   Alkaline Phosphatase 84 38 - 126 U/L   Total Bilirubin 1.7 (H) 0.3 - 1.2 mg/dL   GFR calc non Af Amer 34 (L) >60 mL/min   GFR calc Af Amer 39 (L) >60 mL/min    Comment: (NOTE) The eGFR has been calculated using the CKD EPI equation. This calculation has not been validated in all clinical situations. eGFR's persistently <60 mL/min  signify possible Chronic Kidney Disease.    Anion gap 7 5 - 15  Protime-INR     Status: Abnormal   Collection Time: 03/28/17 11:38 AM  Result Value Ref Range   Prothrombin Time 16.5  (H) 11.4 - 15.2 seconds   INR 1.35    No results found.  Review of Systems  Constitutional: Negative for chills and fever.  HENT: Negative for hearing loss.   Eyes: Negative for blurred vision and double vision.  Respiratory: Negative for cough and hemoptysis.   Cardiovascular: Negative for chest pain and palpitations.  Gastrointestinal: Positive for abdominal pain. Negative for nausea and vomiting.  Genitourinary: Negative for dysuria and urgency.  Musculoskeletal: Negative for myalgias and neck pain.  Skin: Negative for itching and rash.  Neurological: Negative for dizziness, tingling and headaches.  Endo/Heme/Allergies: Does not bruise/bleed easily.  Psychiatric/Behavioral: Negative for depression and suicidal ideas.    Blood pressure (!) 155/69, pulse (!) 42, temperature 98.8 F (37.1 C), temperature source Oral, resp. rate 19, height _0  (1.778 m), weight 89.8 kg (198 lb), SpO2 98 %. Physical Exam  Vitals reviewed. Constitutional: He is oriented to person, place, and time. He appears well-developed and well-nourished.  HENT:  Head: Normocephalic and atraumatic.  Eyes: Conjunctivae and EOM are normal. Pupils are equal, round, and reactive to light.  Neck: Normal range of motion. Neck supple.  Cardiovascular: Normal rate and regular rhythm.  Respiratory: Effort normal and breath sounds normal.  GI: Soft. Bowel sounds are normal. He exhibits no distension. There is no tenderness.  Large bilateral inguinal hernias, right side chronically incarcerated  Musculoskeletal: Normal range of motion.  Neurological: He is alert and oriented to person, place, and time.  Skin: Skin is warm and dry.  Psychiatric: He has a normal mood and affect. His behavior is normal.     Assessment/Plan 76 yo male with bilateral large inguinal hernias -bilateral open inguinal hernia repair with mesh -planned observation after surgery  Mickeal Skinner, MD 03/30/2017, 8:53 AM

## 2017-03-30 NOTE — Progress Notes (Signed)
AssistedDr. Gifford Shave with right, left, ultrasound guided, transabdominal plane block. Side rails up, monitors on throughout procedure. See vital signs in flow sheet. Tolerated Procedure well.

## 2017-03-30 NOTE — Anesthesia Procedure Notes (Addendum)
Anesthesia Regional Block: TAP block   Pre-Anesthetic Checklist: ,, timeout performed, Correct Patient, Correct Site, Correct Laterality, Correct Procedure, Correct Position, site marked, Risks and benefits discussed,  Surgical consent,  Pre-op evaluation,  At surgeon's request and post-op pain management  Laterality: Right and Left  Prep: chloraprep       Needles:  Injection technique: Single-shot  Needle Type: Echogenic Needle     Needle Length: 9cm  Needle Gauge: 21     Additional Needles:   Procedures:,,,, ultrasound used (permanent image in chart),,,,  Narrative:  Start time: 03/30/2017 8:35 AM End time: 03/30/2017 8:45 AM Injection made incrementally with aspirations every 5 mL.  Performed by: Personally  Anesthesiologist: [name], MD  Additional Notes: No pain on injection. No increased resistance to injection. Injection made in 5cc increments.  Good needle visualization.  Patient tolerated procedure well.  BILATERAL TAP BLOCKS using total of 60cc of 0.25% Ropivacaine.

## 2017-03-30 NOTE — Transfer of Care (Signed)
Immediate Anesthesia Transfer of Care Note  Patient: Donald Moreno  Procedure(s) Performed: OPEN BILATERAL INGUINAL HERNIA REPAIR WITH MESH (Bilateral ) INSERTION OF MESH (Bilateral )  Patient Location: PACU  Anesthesia Type:General  Level of Consciousness: awake, alert  and patient cooperative  Airway & Oxygen Therapy: Patient Spontanous Breathing and Patient connected to face mask oxygen  Post-op Assessment: Report given to RN, Post -op Vital signs reviewed and stable and Patient moving all extremities X 4  Post vital signs: stable  Last Vitals:  Vitals:   03/30/17 0848 03/30/17 1140  BP:  122/60  Pulse: (!) 42 (!) 46  Resp: 19 16  Temp:    SpO2: 98% 99%    Last Pain:  Vitals:   03/30/17 1140  TempSrc:   PainSc: 0-No pain      Patients Stated Pain Goal: 4 (70/96/43 [id number])  Complications: No apparent anesthesia complications

## 2017-03-30 NOTE — Anesthesia Preprocedure Evaluation (Addendum)
Anesthesia Evaluation  Patient identified by MRN, date of birth, ID band Patient awake    Reviewed: Allergy & Precautions, NPO status , Patient's Chart, lab work & pertinent test results, reviewed documented beta blocker date and time   Airway Mallampati: II  TM Distance: >3 FB Neck ROM: Full    Dental  (+) Edentulous Upper, Edentulous Lower   Pulmonary asthma , pneumonia, resolved,    Pulmonary exam normal breath sounds clear to auscultation       Cardiovascular hypertension, Pt. on medications and Pt. on home beta blockers Normal cardiovascular exam Rhythm:Regular Rate:Normal     Neuro/Psych PSYCHIATRIC DISORDERS negative neurological ROS     GI/Hepatic negative GI ROS, (+) Cirrhosis   ascites  substance abuse  alcohol use, S/P TIPS procedure   Endo/Other  Gout  Renal/GU Renal InsufficiencyRenal disease  negative genitourinary   Musculoskeletal  (+) Arthritis , Osteoarthritis,  Chronic low back pain   Abdominal   Peds  Hematology  (+) anemia , Thrombocytopenia   Anesthesia Other Findings   Reproductive/Obstetrics                            Anesthesia Physical Anesthesia Plan  ASA: IV  Anesthesia Plan: General   Post-op Pain Management:  Regional for Post-op pain   Induction: Intravenous  PONV Risk Score and Plan: 4 or greater and Ondansetron, Dexamethasone, Midazolam, Metaclopromide and Treatment may vary due to age or medical condition  Airway Management Planned: Oral ETT  Additional Equipment:   Intra-op Plan:   Post-operative Plan: Extubation in OR  Informed Consent: I have reviewed the patients History and Physical, chart, labs and discussed the procedure including the risks, benefits and alternatives for the proposed anesthesia with the patient or authorized representative who has indicated his/her understanding and acceptance.     Plan Discussed with: CRNA,  Anesthesiologist and Surgeon  Anesthesia Plan Comments:        Anesthesia Quick Evaluation

## 2017-03-30 NOTE — Op Note (Signed)
Preop diagnosis: bilateral inguinal hernia  Postop diagnosis: bilateral indirect inguinal hernia, ascites  Procedure: open Bilateral inguinal hernia repair with mesh  Surgeon: Gurney Maxin, M.D.  Asst: none  Anesthesia: Gen.   Indications for procedure: Donald Moreno is a 76 y.o. male with symptoms of pain and enlarging Bilateral inguinal hernia(s). After discussing risks, alternatives and benefits he decided on open repair and was brought to day surgery for repair. The patient underwent preoperative TAP block bilaterally.  Description of procedure: The patient was brought into the operative suite, placed supine. Anesthesia was administered with endotracheal tube. Patient was strapped in place. The patient was prepped and draped in the usual sterile fashion.  The anterior superior iliac spine and pubic tubercle were identified on the Right side. An incision was made 1cm above the connecting line, representative of the location of the inguinal ligament. The subcutaneous tissue was bluntly dissected, scarpa's fascia was dissected away. The external abdominal oblique fascia was identified and sharply opened down to the external inguinal ring. The conjoint tendon and inguinal ligament were identified. The cord structures and sac were dissected free of the surrounding tissue in 360 degrees. A penrose drain was used to encircle the contents. The cremasteric fibers were dissected free of the contents of the cord and hernia sac. The hernia sac was >20cm in length and very fibrous and scarred. The sac was opened and 4L of ascites was drained from the sac and peritoneal cavity. There was difficulty in identifying the cord structures due to the size of the hernia but eventually the cord structures were safely identified. The cord structures (vessels and vas deferens) were identified and carefully dissected away from the hernia sac. The hernia sac was dissected down to the internal inguinal ring.  Preperitoneal fat was identified showing appropriate dissection. The sac was divided at the based and a 2-0 vicryl used to close the sac in running fashion. The sac was then reduced into the preperitoneal space. A 3x6 Ultrapro mesh was then used to close the defect and reinforce the floor. The mesh was sutured to the lacunar ligament and inguinal ligament using a 2-0 prolene in running fashion. Next the superior edge of the mesh was sutured to the conjoined tendon using a 2-0 running Prolene. An additional 2-0 Prolene was used to suture the tail ends of the mesh together re-creating the deep ring. Cord structures are running in a neutral position through the mesh. Next the external abdominal oblique fascia was closed with a 2-0 Vicryl in running fashion to re-create the external inguinal ring. Scarpa's fascia was closed with 3-0 Vicryl in running fashion. Skin was closed with a 4-0 Monocryl subcuticular stitch in running fashion. Dermabond place for dressing. Patient woke from anesthesia and brought to PACU in stable condition. All counts are correct.  The anterior superior iliac spine and pubic tubercle were identified on the Left side. An incision was made 1cm above the connecting line, representative of the location of the inguinal ligament. The subcutaneous tissue was bluntly dissected, scarpa's fascia was dissected away. The external abdominal oblique fascia was identified and sharply opened down to the external inguinal ring. The conjoint tendon and inguinal ligament were identified. The cord structures and sac were dissected free of the surrounding tissue in 360 degrees. A penrose drain was used to encircle the contents. The cremasteric fibers were dissected free of the contents of the cord and hernia sac. The cord structures (vessels and vas deferens) were identified and carefully dissected away from  the hernia sac. The hernia sac was fatty and about 8cm in length. The hernia sac was dissected down to the  internal inguinal ring. Preperitoneal fat was identified showing appropriate dissection. The sac was divided and closed with 2-0 vicryl in running fashion. The sac was then reduced into the preperitoneal space. A 3x6 ultrapro mesh was then used to close the defect and reinforce the floor. The mesh was sutured to the lacunar ligament and inguinal ligament using a 2-0 prolene in running fashion. Next the superior edge of the mesh was sutured to the conjoined tendon using a 2-0 running Prolene. An additional 2-0 Prolene was used to suture the tail ends of the mesh together re-creating the deep ring. Cord structures are running in a neutral position through the mesh. Next the external abdominal oblique fascia was closed with a 2-0 Vicryl in running fashion to re-create the external inguinal ring. Scarpa's fascia was closed with 3-0 Vicryl in running fashion. Skin was closed with a 4-0 Monocryl subcuticular stitch in running fashion. Dermabond place for dressing. Patient woke from anesthesia and brought to PACU in stable condition. All counts are correct.  Findings: bilateral inguinal hernia, 4L of ascites  Specimen: none  Blood loss: 100 ml  Local anesthesia: none (block down preop  Complications: none  Implant: 3x6 ultrapro to right and 3x5 ultrapro to left  Gurney Maxin, M.D. General, Bariatric, & Minimally Invasive Surgery [facility], [place] 11:37 AM 03/30/2017

## 2017-03-31 ENCOUNTER — Ambulatory Visit: Payer: Medicare Other | Admitting: Cardiovascular Disease

## 2017-03-31 ENCOUNTER — Encounter (HOSPITAL_COMMUNITY): Payer: Self-pay | Admitting: General Surgery

## 2017-03-31 DIAGNOSIS — D649 Anemia, unspecified: Secondary | ICD-10-CM | POA: Diagnosis not present

## 2017-03-31 DIAGNOSIS — R188 Other ascites: Secondary | ICD-10-CM | POA: Diagnosis not present

## 2017-03-31 DIAGNOSIS — D696 Thrombocytopenia, unspecified: Secondary | ICD-10-CM | POA: Diagnosis not present

## 2017-03-31 DIAGNOSIS — M545 Low back pain: Secondary | ICD-10-CM | POA: Diagnosis not present

## 2017-03-31 DIAGNOSIS — K7460 Unspecified cirrhosis of liver: Secondary | ICD-10-CM | POA: Diagnosis not present

## 2017-03-31 DIAGNOSIS — I129 Hypertensive chronic kidney disease with stage 1 through stage 4 chronic kidney disease, or unspecified chronic kidney disease: Secondary | ICD-10-CM | POA: Diagnosis not present

## 2017-03-31 DIAGNOSIS — M109 Gout, unspecified: Secondary | ICD-10-CM | POA: Diagnosis not present

## 2017-03-31 DIAGNOSIS — N183 Chronic kidney disease, stage 3 (moderate): Secondary | ICD-10-CM | POA: Diagnosis not present

## 2017-03-31 DIAGNOSIS — K4020 Bilateral inguinal hernia, without obstruction or gangrene, not specified as recurrent: Secondary | ICD-10-CM | POA: Diagnosis not present

## 2017-03-31 DIAGNOSIS — J45909 Unspecified asthma, uncomplicated: Secondary | ICD-10-CM | POA: Diagnosis not present

## 2017-03-31 DIAGNOSIS — G8929 Other chronic pain: Secondary | ICD-10-CM | POA: Diagnosis not present

## 2017-03-31 DIAGNOSIS — M1990 Unspecified osteoarthritis, unspecified site: Secondary | ICD-10-CM | POA: Diagnosis not present

## 2017-03-31 DIAGNOSIS — Z79899 Other long term (current) drug therapy: Secondary | ICD-10-CM | POA: Diagnosis not present

## 2017-03-31 LAB — CBC
HEMATOCRIT: 33.7 % — AB (ref 39.0–52.0)
HEMOGLOBIN: 11.3 g/dL — AB (ref 13.0–17.0)
MCH: 35.3 pg — ABNORMAL HIGH (ref 26.0–34.0)
MCHC: 33.5 g/dL (ref 30.0–36.0)
MCV: 105.3 fL — AB (ref 78.0–100.0)
Platelets: 121 10*3/uL — ABNORMAL LOW (ref 150–400)
RBC: 3.2 MIL/uL — ABNORMAL LOW (ref 4.22–5.81)
RDW: 14.3 % (ref 11.5–15.5)
WBC: 9.8 10*3/uL (ref 4.0–10.5)

## 2017-03-31 LAB — BASIC METABOLIC PANEL
ANION GAP: 7 (ref 5–15)
BUN: 42 mg/dL — ABNORMAL HIGH (ref 6–20)
CHLORIDE: 105 mmol/L (ref 101–111)
CO2: 24 mmol/L (ref 22–32)
Calcium: 9 mg/dL (ref 8.9–10.3)
Creatinine, Ser: 2.14 mg/dL — ABNORMAL HIGH (ref 0.61–1.24)
GFR calc Af Amer: 33 mL/min — ABNORMAL LOW (ref >60)
GFR, EST NON AFRICAN AMERICAN: 29 mL/min — AB (ref >60)
GLUCOSE: 185 mg/dL — AB (ref 65–99)
POTASSIUM: 4.6 mmol/L (ref 3.5–5.1)
Sodium: 136 mmol/L (ref 135–145)

## 2017-03-31 LAB — PHOSPHORUS: Phosphorus: 4.2 mg/dL (ref 2.5–4.6)

## 2017-03-31 LAB — MAGNESIUM: MAGNESIUM: 2 mg/dL (ref 1.7–2.4)

## 2017-03-31 MED ORDER — OXYCODONE HCL 5 MG PO TABS: 5.0000 mg | ORAL_TABLET | ORAL | 0 refills | Status: DC | PRN

## 2017-03-31 NOTE — Discharge Summary (Signed)
Physician Discharge Summary  Donald Moreno ZDG:[id number] DOB: [birth year] DOA: 03/30/2017  PCP: Shirley, [place], DO  Admit date: 03/30/2017 Discharge date: 03/31/2017  Recommendations for Outpatient Follow-up:  1. Dr. Kieth Brightly in 3 weeks   Discharge Diagnoses:  Active Problems:   Bilateral inguinal hernia   Surgical Procedure: bilateral open inguinal hernia repair  Discharge Condition: Good Disposition: Home  Diet recommendation: heart healthy diet   Hospital Course:  76 yo male with known cirrhosis presented for prepare of bilateral inguinal hernias by open technique. Post op he did well and was able to ambulate and tolerate a regular diet. He had an expected creatinine rise given the 4L of ascites fluid removed at time of surgery. This will be rechecked as outpatient to ensure resolution to baseline.  Discharge Instructions  Discharge Instructions    Call MD for:  difficulty breathing, headache or visual disturbances   Complete by:  As directed    Call MD for:  hives   Complete by:  As directed    Call MD for:  persistant nausea and vomiting   Complete by:  As directed    Call MD for:  redness, tenderness, or signs of infection (pain, swelling, redness, odor or green/yellow discharge around incision site)   Complete by:  As directed    Call MD for:  severe uncontrolled pain   Complete by:  As directed    Call MD for:  temperature >100.4   Complete by:  As directed    Diet - low sodium heart healthy   Complete by:  As directed    Discharge wound care:   Complete by:  As directed    Dry dressing to bilateral wounds for 1 week, change daily. Ok to shower. If wounds drain notify office   Increase activity slowly   Complete by:  As directed      Allergies as of 03/31/2017   No Known Allergies     Medication List    TAKE these medications   ADVAIR DISKUS 500-50 MCG/DOSE Aepb Generic drug:  Fluticasone-Salmeterol INHALE 1 PUFF TWO TIMES  DAILY   allopurinol  300 MG tablet Commonly known as:  ZYLOPRIM TAKE 1 TABLET BY MOUTH  DAILY What changed:    how much to take  how to take this  when to take this   bisoprolol 10 MG tablet Commonly known as:  ZEBETA Take 1 tablet (10 mg total) by mouth daily.   ENSURE COMPLETE SHAKE Liqd Take 1 Container by mouth daily. What changed:  when to take this   ferrous sulfate 325 (65 FE) MG tablet Take 1 tablet (325 mg total) by mouth 2 (two) times daily with a meal.   furosemide 40 MG tablet Commonly known as:  LASIX Take 40 mg daily by mouth.   lactulose 10 GM/15ML solution Commonly known as:  CHRONULAC Take 15 mLs (10 g total) by mouth 2 (two) times daily as needed (need to ensure 2 soft BM in a day.).   montelukast 10 MG tablet Commonly known as:  SINGULAIR TAKE 1 TABLET BY MOUTH AT  BEDTIME What changed:    how much to take  how to take this  when to take this   oxyCODONE 5 MG immediate release tablet Commonly known as:  Oxy IR/ROXICODONE Take 1 tablet (5 mg total) every 4 (four) hours as needed by mouth for moderate pain.   PROAIR HFA 108 (90 Base) MCG/ACT inhaler Generic drug:  albuterol Use 2 puffs every 4 hours  as needed for wheezing or  shortness of breath   Vitamin D-3 5000 units Tabs Take 5,000 Units daily by mouth.            Discharge Care Instructions  (From admission, onward)        Start     Ordered   03/31/17 0000  Discharge wound care:    Comments:  Dry dressing to bilateral wounds for 1 week, change daily. Ok to shower. If wounds drain notify office   03/31/17 0805        The results of significant diagnostics from this hospitalization (including imaging, microbiology, ancillary and laboratory) are listed below for reference.    Significant Diagnostic Studies: US Abdomen Complete  Result Date: 03/22/2017 CLINICAL DATA:  Hepatic cirrhosis, hepatoma screening. EXAM: ABDOMEN ULTRASOUND COMPLETE COMPARISON:  Abdominopelvic CT scan of January 23, 2017 FINDINGS: Gallbladder: The gallbladder is adequately distended. There is mild wall thickening to 5.1 mm. A stone is visible measuring 1.5 cm in diameter. There is no positive sonographic Murphy's sign. There is a moderate amount of ascites in the gallbladder fossa and surrounding the liver. Common bile duct: Diameter: 3.6 mm Liver: The liver appears shrunken. It is surrounded by ascites. The surface contour is mildly irregular. The hepatic echotexture is increased. There is no discrete mass or ductal dilation. A TIPS shunt device is present. Portal vein is patent on color Doppler imaging with normal direction of blood flow towards the liver. IVC: Normal where visualized Pancreas: Visualization of the pancreatic head and tail is limited due to bowel gas. The pancreatic body is grossly normal. Spleen: The spleen is not enlarged. Right Kidney: Length: 10.1 cm. Echogenicity within normal limits. No mass or hydronephrosis visualized. Left Kidney: Length: 9.8 cm. Echogenicity within normal limits. No mass or hydronephrosis visualized. Abdominal aorta: No aneurysm visualized. Other findings: There is a moderate amount of ascites throughout the abdomen. IMPRESSION: Cirrhotic changes within the liver. No suspicious hepatic masses. Moderate amount of ascites. 1.5 cm diameter gallstone. No sonographic evidence of acute cholecystitis. No acute abnormalities observed elsewhere within the abdomen. Electronically Signed   By: David  Martinique M.D.   On: 03/22/2017 13:27    Labs: Basic Metabolic Panel: Recent Labs  Lab 03/28/17 1138 03/31/17 0447  NA 139 136  K 4.2 4.6  CL 106 105  CO2 26 24  GLUCOSE 82 185*  BUN 35* 42*  CREATININE 1.88* 2.14*  CALCIUM 9.7 9.0  MG  --  2.0  PHOS  --  4.2   Liver Function Tests: Recent Labs  Lab 03/28/17 1138  AST 40  ALT 23  ALKPHOS 84  BILITOT 1.7*  PROT 7.5  ALBUMIN 3.4*    CBC: Recent Labs  Lab 03/28/17 1138 03/31/17 0447  WBC 5.7 9.8  NEUTROABS 2.7  --    HGB 11.8* 11.3*  HCT 35.4* 33.7*  MCV 106.0* 105.3*  PLT 121* 121*    CBG: No results for input(s): GLUCAP in the last 168 hours.  Active Problems:   Bilateral inguinal hernia   Time coordinating discharge: <60min

## 2017-04-10 ENCOUNTER — Encounter (HOSPITAL_COMMUNITY): Payer: Self-pay | Admitting: General Surgery

## 2017-04-11 ENCOUNTER — Ambulatory Visit (INDEPENDENT_AMBULATORY_CARE_PROVIDER_SITE_OTHER): Payer: Medicare Other | Admitting: Family Medicine

## 2017-04-11 ENCOUNTER — Encounter: Payer: Self-pay | Admitting: Cardiology

## 2017-04-11 ENCOUNTER — Ambulatory Visit (INDEPENDENT_AMBULATORY_CARE_PROVIDER_SITE_OTHER): Payer: Medicare Other | Admitting: Cardiology

## 2017-04-11 ENCOUNTER — Encounter (HOSPITAL_COMMUNITY): Payer: Self-pay | Admitting: Emergency Medicine

## 2017-04-11 ENCOUNTER — Encounter: Payer: Self-pay | Admitting: Family Medicine

## 2017-04-11 ENCOUNTER — Emergency Department (HOSPITAL_COMMUNITY): Payer: Medicare Other

## 2017-04-11 ENCOUNTER — Inpatient Hospital Stay (HOSPITAL_COMMUNITY)
Admission: EM | Admit: 2017-04-11 | Discharge: 2017-04-17 | DRG: 433 | Disposition: A | Discharge status: Home Health | Payer: Medicare Other | Attending: Family Medicine | Admitting: Family Medicine

## 2017-04-11 ENCOUNTER — Other Ambulatory Visit: Payer: Self-pay

## 2017-04-11 DIAGNOSIS — R972 Elevated prostate specific antigen [PSA]: Secondary | ICD-10-CM | POA: Diagnosis not present

## 2017-04-11 DIAGNOSIS — N179 Acute kidney failure, unspecified: Secondary | ICD-10-CM | POA: Diagnosis not present

## 2017-04-11 DIAGNOSIS — N5089 Other specified disorders of the male genital organs: Secondary | ICD-10-CM | POA: Diagnosis present

## 2017-04-11 DIAGNOSIS — K921 Melena: Secondary | ICD-10-CM | POA: Diagnosis present

## 2017-04-11 DIAGNOSIS — I2721 Secondary pulmonary arterial hypertension: Secondary | ICD-10-CM | POA: Diagnosis not present

## 2017-04-11 DIAGNOSIS — N4889 Other specified disorders of penis: Secondary | ICD-10-CM | POA: Diagnosis present

## 2017-04-11 DIAGNOSIS — I50810 Right heart failure, unspecified: Secondary | ICD-10-CM | POA: Diagnosis present

## 2017-04-11 DIAGNOSIS — J45909 Unspecified asthma, uncomplicated: Secondary | ICD-10-CM | POA: Diagnosis not present

## 2017-04-11 DIAGNOSIS — I130 Hypertensive heart and chronic kidney disease with heart failure and stage 1 through stage 4 chronic kidney disease, or unspecified chronic kidney disease: Secondary | ICD-10-CM | POA: Diagnosis present

## 2017-04-11 DIAGNOSIS — R001 Bradycardia, unspecified: Secondary | ICD-10-CM | POA: Diagnosis not present

## 2017-04-11 DIAGNOSIS — G8929 Other chronic pain: Secondary | ICD-10-CM | POA: Diagnosis present

## 2017-04-11 DIAGNOSIS — K766 Portal hypertension: Secondary | ICD-10-CM | POA: Diagnosis present

## 2017-04-11 DIAGNOSIS — K4020 Bilateral inguinal hernia, without obstruction or gangrene, not specified as recurrent: Secondary | ICD-10-CM

## 2017-04-11 DIAGNOSIS — Z8249 Family history of ischemic heart disease and other diseases of the circulatory system: Secondary | ICD-10-CM

## 2017-04-11 DIAGNOSIS — N183 Chronic kidney disease, stage 3 unspecified: Secondary | ICD-10-CM

## 2017-04-11 DIAGNOSIS — R319 Hematuria, unspecified: Secondary | ICD-10-CM | POA: Diagnosis not present

## 2017-04-11 DIAGNOSIS — R188 Other ascites: Secondary | ICD-10-CM | POA: Diagnosis not present

## 2017-04-11 DIAGNOSIS — E8770 Fluid overload, unspecified: Secondary | ICD-10-CM | POA: Diagnosis not present

## 2017-04-11 DIAGNOSIS — R601 Generalized edema: Secondary | ICD-10-CM

## 2017-04-11 DIAGNOSIS — Z66 Do not resuscitate: Secondary | ICD-10-CM | POA: Diagnosis present

## 2017-04-11 DIAGNOSIS — N189 Chronic kidney disease, unspecified: Secondary | ICD-10-CM

## 2017-04-11 DIAGNOSIS — K7031 Alcoholic cirrhosis of liver with ascites: Secondary | ICD-10-CM

## 2017-04-11 DIAGNOSIS — I2729 Other secondary pulmonary hypertension: Secondary | ICD-10-CM | POA: Diagnosis present

## 2017-04-11 DIAGNOSIS — I509 Heart failure, unspecified: Secondary | ICD-10-CM

## 2017-04-11 DIAGNOSIS — M109 Gout, unspecified: Secondary | ICD-10-CM | POA: Diagnosis present

## 2017-04-11 DIAGNOSIS — D539 Nutritional anemia, unspecified: Secondary | ICD-10-CM | POA: Diagnosis not present

## 2017-04-11 DIAGNOSIS — I340 Nonrheumatic mitral (valve) insufficiency: Secondary | ICD-10-CM | POA: Diagnosis not present

## 2017-04-11 DIAGNOSIS — E440 Moderate protein-calorie malnutrition: Secondary | ICD-10-CM | POA: Diagnosis present

## 2017-04-11 DIAGNOSIS — K7460 Unspecified cirrhosis of liver: Secondary | ICD-10-CM | POA: Diagnosis not present

## 2017-04-11 DIAGNOSIS — T50905A Adverse effect of unspecified drugs, medicaments and biological substances, initial encounter: Secondary | ICD-10-CM | POA: Diagnosis not present

## 2017-04-11 DIAGNOSIS — Z6828 Body mass index (BMI) 28.0-28.9, adult: Secondary | ICD-10-CM

## 2017-04-11 DIAGNOSIS — I483 Typical atrial flutter: Secondary | ICD-10-CM | POA: Diagnosis present

## 2017-04-11 DIAGNOSIS — J9811 Atelectasis: Secondary | ICD-10-CM | POA: Diagnosis not present

## 2017-04-11 DIAGNOSIS — M545 Low back pain: Secondary | ICD-10-CM | POA: Diagnosis not present

## 2017-04-11 DIAGNOSIS — F1021 Alcohol dependence, in remission: Secondary | ICD-10-CM | POA: Diagnosis present

## 2017-04-11 DIAGNOSIS — Z7951 Long term (current) use of inhaled steroids: Secondary | ICD-10-CM

## 2017-04-11 DIAGNOSIS — M1990 Unspecified osteoarthritis, unspecified site: Secondary | ICD-10-CM | POA: Diagnosis not present

## 2017-04-11 DIAGNOSIS — I50813 Acute on chronic right heart failure: Secondary | ICD-10-CM | POA: Diagnosis not present

## 2017-04-11 DIAGNOSIS — Z79899 Other long term (current) drug therapy: Secondary | ICD-10-CM

## 2017-04-11 HISTORY — DX: Other specified diseases of blood and blood-forming organs: D75.89

## 2017-04-11 HISTORY — DX: Heart failure, unspecified: I50.9

## 2017-04-11 HISTORY — DX: Cardiac arrhythmia, unspecified: I49.9

## 2017-04-11 HISTORY — DX: Melena: K92.1

## 2017-04-11 LAB — CBC
HCT: 35.3 % — ABNORMAL LOW (ref 39.0–52.0)
HEMOGLOBIN: 11.7 g/dL — AB (ref 13.0–17.0)
MCH: 35.2 pg — AB (ref 26.0–34.0)
MCHC: 33.1 g/dL (ref 30.0–36.0)
MCV: 106.3 fL — ABNORMAL HIGH (ref 78.0–100.0)
PLATELETS: 133 10*3/uL — AB (ref 150–400)
RBC: 3.32 MIL/uL — AB (ref 4.22–5.81)
RDW: 14.1 % (ref 11.5–15.5)
WBC: 7.8 10*3/uL (ref 4.0–10.5)

## 2017-04-11 LAB — MAGNESIUM: Magnesium: 2.2 mg/dL (ref 1.7–2.4)

## 2017-04-11 LAB — BASIC METABOLIC PANEL
ANION GAP: 9 (ref 5–15)
BUN: 30 mg/dL — ABNORMAL HIGH (ref 6–20)
CALCIUM: 8.5 mg/dL — AB (ref 8.9–10.3)
CO2: 23 mmol/L (ref 22–32)
Chloride: 109 mmol/L (ref 101–111)
Creatinine, Ser: 1.9 mg/dL — ABNORMAL HIGH (ref 0.61–1.24)
GFR calc non Af Amer: 33 mL/min — ABNORMAL LOW (ref >60)
GFR, EST AFRICAN AMERICAN: 38 mL/min — AB (ref >60)
Glucose, Bld: 82 mg/dL (ref 65–99)
Potassium: 3.9 mmol/L (ref 3.5–5.1)
Sodium: 141 mmol/L (ref 135–145)

## 2017-04-11 LAB — HEPATIC FUNCTION PANEL
ALT: 22 U/L (ref 17–63)
AST: 39 U/L (ref 15–41)
Albumin: 2.9 g/dL — ABNORMAL LOW (ref 3.5–5.0)
Alkaline Phosphatase: 91 U/L (ref 38–126)
BILIRUBIN DIRECT: 0.5 mg/dL (ref 0.1–0.5)
BILIRUBIN INDIRECT: 0.9 mg/dL (ref 0.3–0.9)
BILIRUBIN TOTAL: 1.4 mg/dL — AB (ref 0.3–1.2)
Total Protein: 7.4 g/dL (ref 6.5–8.1)

## 2017-04-11 LAB — BRAIN NATRIURETIC PEPTIDE: B Natriuretic Peptide: 1699.9 pg/mL — ABNORMAL HIGH (ref 0.0–100.0)

## 2017-04-11 MED ORDER — ENSURE ENLIVE PO LIQD
237.0000 mL | Freq: Two times a day (BID) | ORAL | Status: DC
  Administered 2017-04-13 – 2017-04-17 (×9): 237 mL via ORAL

## 2017-04-11 MED ORDER — ALLOPURINOL 300 MG PO TABS
300.0000 mg | ORAL_TABLET | Freq: Every day | ORAL | Status: DC
  Administered 2017-04-12 – 2017-04-17 (×6): 300 mg via ORAL
  Filled 2017-04-11 (×6): qty 1

## 2017-04-11 MED ORDER — LACTULOSE 10 GM/15ML PO SOLN
10.0000 g | Freq: Two times a day (BID) | ORAL | Status: DC | PRN
  Filled 2017-04-11: qty 15

## 2017-04-11 MED ORDER — ALBUTEROL SULFATE HFA 108 (90 BASE) MCG/ACT IN AERS: 2.0000 | INHALATION_SPRAY | RESPIRATORY_TRACT | Status: DC | PRN

## 2017-04-11 MED ORDER — MONTELUKAST SODIUM 10 MG PO TABS
10.0000 mg | ORAL_TABLET | Freq: Every day | ORAL | Status: DC
  Administered 2017-04-12 – 2017-04-16 (×5): 10 mg via ORAL
  Filled 2017-04-11 (×5): qty 1

## 2017-04-11 MED ORDER — FERROUS SULFATE 325 (65 FE) MG PO TABS
325.0000 mg | ORAL_TABLET | Freq: Two times a day (BID) | ORAL | Status: DC
  Administered 2017-04-12 – 2017-04-17 (×10): 325 mg via ORAL
  Filled 2017-04-11 (×11): qty 1

## 2017-04-11 MED ORDER — ACETAMINOPHEN 650 MG RE SUPP: 650.0000 mg | Freq: Four times a day (QID) | RECTAL | Status: DC | PRN

## 2017-04-11 MED ORDER — IPRATROPIUM-ALBUTEROL 0.5-2.5 (3) MG/3ML IN SOLN
3.0000 mL | Freq: Once | RESPIRATORY_TRACT | Status: AC
  Administered 2017-04-11: 3 mL via RESPIRATORY_TRACT
  Filled 2017-04-11: qty 3

## 2017-04-11 MED ORDER — HEPARIN SODIUM (PORCINE) 5000 UNIT/ML IJ SOLN: 5000.0000 [IU] | Freq: Three times a day (TID) | INTRAMUSCULAR | Status: DC

## 2017-04-11 MED ORDER — FUROSEMIDE 10 MG/ML IJ SOLN
40.0000 mg | Freq: Once | INTRAMUSCULAR | Status: AC
  Administered 2017-04-11: 40 mg via INTRAVENOUS
  Filled 2017-04-11: qty 4

## 2017-04-11 MED ORDER — ACETAMINOPHEN 325 MG PO TABS
650.0000 mg | ORAL_TABLET | Freq: Four times a day (QID) | ORAL | Status: DC | PRN
  Administered 2017-04-17: 650 mg via ORAL
  Filled 2017-04-11: qty 2

## 2017-04-11 MED ORDER — MOMETASONE FURO-FORMOTEROL FUM 200-5 MCG/ACT IN AERO
2.0000 | INHALATION_SPRAY | Freq: Two times a day (BID) | RESPIRATORY_TRACT | Status: DC
  Administered 2017-04-12 – 2017-04-17 (×9): 2 via RESPIRATORY_TRACT
  Filled 2017-04-11 (×2): qty 8.8

## 2017-04-11 NOTE — H&P (Signed)
[facility] Admission History and Physical Service Pager: [phone]  [name] Medical record number: [id number] Date of birth: 25-Jul-1940 Age: 76 y.o. Gender: male  Primary Care Provider: Shirley, [place], DO Consultants: Cardiology Code Status: DNR (intubate only in setting of surgery)  Chief Complaint: volume overload  Assessment and Plan: Donald Moreno is a 76 y.o. male presenting from Cardiology office with fluid overload. PMH is significant for liver cirrhosis, previous aflutter with RVR, CKD3, EtOH abuse (quit 3-4 years ago), asthma, s/p incarcerated umbilical hernia repair 01/5283 and elective bilateral inguinal hernia repair 03/30/2017.   #Volume overload: Unclear if secondary to portal hypertension despite TIPS. Poor adherence to medications could also be contributing as patient unable to name his medications but does report taking a 40 mg tablet daily, so may be compliant but just with poor health literacy. Found to be up 40 lbs over last 3 months. BNP elevated to 1699.9. Denies dyspnea or orthopnea. No previous ECHO on file. No chest pain or exertional dyspnea. Unsure if post-surgical inflammation could be worsening volume status. Will monitor for response to aggressive diuresis. No pleural effusion noted on CXR but possible mild edema of R hilum/lower lung region.  - Admit to telemetry, attending Dr. Andria Moreno - IV lasix 80 mg BID, per Moreno - Strict I/Os - Daily weights - ECHO ordered - will need scale ordered at d/c  #Bradycardia: HRs in 40s. Previously had asymptomatic bradycardia during hospitalization 06/2016. Hx of aflutter, previously on amiodarone but rate controlled on bisoprolol since developed junctional bradycardia. EKG today with sinus bradycardia.  - Cardiology consulted, to see patient in a.m.; to contact overnight if HRs persistently in 30s - monitor on tele - Hold bisoprolol  #Asthma: On advair BID, albuterol prn,  singulair 10 mg QHS.  Donald Moreno while hospitalized - continue singulair - albuterol prn  #Cirrhosis: Advanced disease s/p TIPS procedure 09/2016; previously had routine paracenteses; 2/2 previous alcohol abuse; did have another paracentesis procedure 10/23/16 and then 4L ascites drained from peritoneal cavity during hernia repair 11/15. TIPS seen as patent 12/21/16 at IR follow-up. Moderate amount of ascites noted on US abdomen 03/22/17.  - takes lactulose 10 mg BID prn  - consider GI/IR consult for worsening ascites  #Recent bilateral inguinal hernia repair on 03/30/17. Denies pain. Has surgery f/u on the 7th. Likely contributing to scrotal/penile swelling. - Elevate scrotum.   #AKI: SCr 1.90 on admission. BL ~ 1.2-1.4. May be secondary to poor renal perfusion with intravascular depletion, though no tachycardia. Improved since hernia surgery with SCr of 2.14 at discharge.  - Continue to monitor  #Protein malnutrition: In setting of chronic liver disease. Albumin 2.9. Continue home ensure shakes BID.  - Nutrition consult  #Gout: Takes allopurinol 300 mg daily. Denies recent flares.  - Continue daily allopurinol  #Blood in stool: PCP suspect internal hemorrhoids, as patient notes BRB on paper and normal colonoscopy in 2016. Hgb stable at 11.7 (BL ~11-12). - Continue to monitor hgb - reordered home ferrous sulfate 325 mg BID  #Hematuria and elevated PSA: To follow-up outpatient with Urology per PCP.   FEN/GI: heart healthy Prophylaxis: SCDs (recent report of blood in stools)  Disposition: pending improvement in volume status  History of Present Illness:  Donald Moreno is a 76 y.o. male presenting with volume overload. He was seen at [facility] today in part for worsening penile and scrotal edema since inguinal hernia repair earlier this month. He was later seen by cardiologist Donald Moreno for  a regularly scheduled appointment, who was also impressed by patient's anasarca. Admit recommended. Patient  says he has noted worsening LE edema but denies pain and does not weigh himself at home. He does use compression hose and says edema has gotten so bad the hose can make his feet look deformed. Denies any weeping of fluid from legs. Denies abdominal pain. Denies misses doses of any medications--says he takes 5 different pills. Denies eating a lot of salty foods or overdoing it on Thanksgiving. Does not limit fluid intake.   Review Of Systems: Per HPI with the following additions:   Review of Systems  Constitutional: Negative for chills and fever.  HENT: Negative for congestion and sore throat.   Eyes: Negative for double vision and pain.  Respiratory: Positive for shortness of breath (with exertion) and wheezing. Negative for cough.   Cardiovascular: Positive for leg swelling. Negative for chest pain, palpitations and orthopnea.  Gastrointestinal: Negative for abdominal pain, constipation, diarrhea, nausea and vomiting.  Genitourinary: Negative for dysuria and frequency.  Musculoskeletal: Negative for back pain and falls.  Skin: Negative for rash.  Neurological: Negative for dizziness, weakness and headaches.    Patient Active Problem List   Diagnosis Date Noted  . Scrotal swelling 04/11/2017  . Blood in stool 04/11/2017  . Acute-on-chronic kidney injury (Donald Moreno) 04/11/2017  . Anasarca 04/11/2017  . Bilateral inguinal hernia 03/30/2017  . Hematuria 01/30/2017  . Alcoholic cirrhosis of liver with ascites (Donald Moreno)   . Typical atrial flutter (Donald Moreno)   . CKD (chronic kidney disease) stage 3, GFR 30-59 ml/min (Donald Moreno) 05/27/2015  . At risk for decreased bone density 03/04/2015  . Inguinal hernia 10/28/2014  . Preventative health care 11/11/2011  . Asthma 01/29/2010  . NICOTINE ADDICTION 04/30/2007  . Gout 07/13/2006  . HYPERTENSION, BENIGN SYSTEMIC 07/13/2006    Past Medical History: Past Medical History:  Diagnosis Date  . Arthritis    "all over"  . Asthma   . Chronic kidney disease    "I  see a kidney dr @ [facility]" (10/11/2016)  . Chronic lower back pain    "turned a truck over a long time ago" (10/11/2016)  . ETOH abuse   . Gout   . Hypertension   . Liver cirrhosis ([place]) 2015  . Pneumonia    "long long time ago" (10/11/2016)  . Spontaneous bacterial peritonitis ([place]) 07/01/2015    Past Surgical History: Past Surgical History:  Procedure Laterality Date  . INGUINAL HERNIA REPAIR Bilateral 03/30/2017   Procedure: OPEN BILATERAL INGUINAL HERNIA REPAIR WITH MESH;  Surgeon: Kinsinger, Arta Bruce, MD;  Location: [facility];  Service: General;  Laterality: Bilateral;  GENERAL COMBINED WITH REGIONAL FOR POST OP PAIN   . INSERTION OF MESH N/A 10/16/2016   Procedure: INSERTION OF MESH;  Surgeon: Kinsinger, Arta Bruce, MD;  Location: [place];  Service: General;  Laterality: N/A;  . INSERTION OF MESH Bilateral 03/30/2017   Procedure: INSERTION OF MESH;  Surgeon: Kieth Brightly Arta Bruce, MD;  Location: [facility];  Service: General;  Laterality: Bilateral;  GENERAL COMBINED WITH REGIONAL FOR POST OP PAIN   . IR PARACENTESIS  08/19/2016  . IR PARACENTESIS  08/26/2016  . IR PARACENTESIS  09/02/2016  . IR PARACENTESIS  09/09/2016  . IR PARACENTESIS  09/16/2016  . IR PARACENTESIS  10/03/2016  . IR PARACENTESIS  10/11/2016  . IR RADIOLOGIST EVAL & MGMT  09/29/2016  . IR RADIOLOGIST EVAL & MGMT  12/21/2016  . IR TIPS  10/11/2016  . RADIOLOGY  WITH ANESTHESIA N/A 10/11/2016   Procedure: TIPS;  Surgeon: Arne Cleveland, MD;  Location: [place];  Service: Radiology;  Laterality: N/A;  . UMBILICAL HERNIA REPAIR N/A 10/16/2016   Procedure: HERNIA REPAIR UMBILICAL ADULT;  Surgeon: Kinsinger, Arta Bruce, MD;  Location: [place];  Service: General;  Laterality: N/A;    Social History: Social History   Tobacco Use  . Smoking status: Never Smoker  . Smokeless tobacco: Current User    Types: Snuff, Chew  Substance Use Topics  . Alcohol use: No    Comment: quit drinking "over 2 yr ago" (10/11/2016)  . Drug use: No    Additional social history: Lives with niece and nephew. Occasionally uses snuff. No longer drinks; used to drink a lot of liquor then quit about 30 years ago and then only drank beer. No illegal drug use.  Please also refer to relevant sections of EMR.  Family History: Family History  Problem Relation Age of Onset  . Other Mother        died when pt was only 61 - unknown cause.  . Other Father        deceased.  . Asthma Sister   . Heart disease Sister        multiple stents.  . Asthma Brother   . Hypertension Brother   . Asthma Son   . Asthma Brother   . Hypertension Brother     Allergies and Medications: No Known Allergies No current facility-administered medications on file prior to encounter.    Current Outpatient Medications on File Prior to Encounter  Medication Sig Dispense Refill  . ADVAIR DISKUS 500-50 MCG/DOSE AEPB INHALE 1 PUFF TWO TIMES  DAILY 180 each 1  . allopurinol (ZYLOPRIM) 300 MG tablet TAKE 1 TABLET BY MOUTH  DAILY 60 tablet 1  . bisoprolol (ZEBETA) 10 MG tablet Take 1 tablet (10 mg total) by mouth daily. 90 tablet 1  . Cholecalciferol (VITAMIN D-3) 5000 units TABS Take 5,000 Units daily by mouth.     . ferrous sulfate 325 (65 FE) MG tablet Take 1 tablet (325 mg total) by mouth 2 (two) times daily with a meal. 60 tablet 3  . furosemide (LASIX) 40 MG tablet Take 40 mg daily by mouth.     . lactulose (CHRONULAC) 10 GM/15ML solution Take 15 mLs (10 g total) by mouth 2 (two) times daily as needed (need to ensure 2 soft BM in a day.). 240 mL 0  . montelukast (SINGULAIR) 10 MG tablet TAKE 1 TABLET BY MOUTH AT  BEDTIME (Patient taking differently: TAKE 10 MG BY MOUTH AT  BEDTIME) 90 tablet 1  . Nutritional Supplements (ENSURE COMPLETE SHAKE) LIQD Take 1 Container by mouth daily. (Patient taking differently: Take 1 Container 2 (two) times daily by mouth. ) 237 mL 11  . oxyCODONE (OXY IR/ROXICODONE) 5 MG immediate release tablet Take 1 tablet (5 mg total) every 4  (four) hours as needed by mouth for moderate pain. 30 tablet 0  . PROAIR HFA 108 (90 Base) MCG/ACT inhaler Use 2 puffs every 4 hours  as needed for wheezing or  shortness of breath 51 g 3    Objective: BP (!) 159/61   Pulse (!) 41   Temp 97.6 F (36.4 C) (Oral)   Resp 19   Ht 5\' 10"  (1.778 m)   Wt 196 lb (88.9 kg)   SpO2 100%   BMI 28.12 kg/m  Exam: General: Very pleasant male in NAD, resting in bed Eyes: Arcus  senilis, PERRL, EOMI ENTM: MMM, minimal nasal congestion Neck: JVD apparent to angle of jaw, supple Cardiovascular: slow heart rate, regular, no murmur appreciated Respiratory: Occasional expiratory wheeze and bibasilar crackles Gastrointestinal: soft, distended, diastasis recti above umbilicus with well healed umbilical hernia MSK: 3+ LE pitting edema, moves all spontaneously GU: Scrotum about the size of a small melon. Penis uncircumcised, swollen.  Derm: Clean incision sites of bilateral inguinal hernia repair, no increased warmth of LEs Neuro: AOx4, no asterixis or focal deficits Psych: Normal mood and affect  Labs and Imaging: CBC BMET  Recent Labs  Lab 04/11/17 1744  WBC 7.8  HGB 11.7*  HCT 35.3*  PLT 133*   Recent Labs  Lab 04/11/17 1744  NA 141  K 3.9  CL 109  CO2 23  BUN 30*  CREATININE 1.90*  GLUCOSE 82  CALCIUM 8.5*     Dg Chest 2 View  Result Date: 04/11/2017 CLINICAL DATA:  Left groin swelling.  Recent inguinal hernia repair. EXAM: CHEST  2 VIEW COMPARISON:  01/23/2017 and 07/01/2015 FINDINGS: Chronic lung changes and scarring in the right lung. Old fracture involving the right seventh rib. Persistent patchy densities in the right hilum and medial right lower lung. Left upper lung is clear. Heart and mediastinum are stable. IMPRESSION: Chronic changes throughout the right hemithorax. There has been minimal change since the previous examination. Hazy densities in the right hilum and right lower lung region could represent mild edema or  atelectasis. Electronically Signed   By: Markus Daft M.D.   On: 04/11/2017 18:10    Rogue Bussing, MD 04/11/2017, 9:14 PM PGY-3, [place] [facility] pager: [phone], text pages welcome

## 2017-04-11 NOTE — Assessment & Plan Note (Signed)
SCr 2.14 at discharge 03/31/17

## 2017-04-11 NOTE — Assessment & Plan Note (Addendum)
TIPS procedure on 10/11/16. 4L taken off 03/30/17

## 2017-04-11 NOTE — Consult Note (Signed)
Cardiology Consultation:   Patient ID: Donald Moreno; [id number]; [birth year]   Admit date: (Not on file) Date of Consult: 04/11/2017  Primary Care Provider: Shirley, [place], DO Primary Cardiologist: No primary care provider on file. Dr Sallyanne Kuster   Patient Profile:   Donald Moreno is a 76 y.o. male with a hx of cirhhosis who is being seen today for the evaluation of anasarca at the request of Dr Martinique  History of Present Illness:   Donald Moreno is 76 y.o. AA male with PMH of advanced liver cirrhosis with portal HTN and ascites, atrial flutter with RVR in the past, CKD stage 3,and a h/o prior EtOH abuse (quit for 3-4 years). He was treated with Amiodarone for rate control and developed a junctional bradycardia, therefore medication was discontinued. He has done well on Bisoprolol. He was hospitalized in June 2018 for incarcerated umbilical hernia and underwent urgent repair of 10/16/2016. He also undergone TIPS surgery. He did still undergo paracentesis on 10/23/2016 with removal of 3.7 L of amber fluid. We saw him in the office 12/29/16 and was in atrial flutter with 2:1conduction and Bisoprolol was added. This was increased to 10 mg 01/11/17. He is not on anticoagulation secondary to advanced liver disease. He has never had an echo.  He was recently admitted for bilateral inguinal hernia repair 03/30/17. 4L of ascites was taken off. The pt had a bump in his SCr post op to 2.1. He saw his PCP today. He reported he had significant scrotal edema since surgery. His PCP thought he was going to see his surgeon today but he actually had an appointment with [place], his surgeon won't see him till Dec 7th. The pt tells me he has painful scrotal and penile edema. In reviewing his records his wgt is up 40 lbs in the last three months. The pt denies DOE.    Past Medical History:  Diagnosis Date  . Arthritis    "all over"  . Asthma   . Chronic kidney disease    "I see a kidney dr @ [facility]" (10/11/2016)    . Chronic lower back pain    "turned a truck over a long time ago" (10/11/2016)  . ETOH abuse   . Gout   . Hypertension   . Liver cirrhosis ([place]) 2015  . Pneumonia    "long long time ago" (10/11/2016)  . Spontaneous bacterial peritonitis ([place]) 07/01/2015    Past Surgical History:  Procedure Laterality Date  . INGUINAL HERNIA REPAIR Bilateral 03/30/2017   Procedure: OPEN BILATERAL INGUINAL HERNIA REPAIR WITH MESH;  Surgeon: Kinsinger, Arta Bruce, MD;  [facility];  Service: General;  Laterality: Bilateral;  GENERAL COMBINED WITH REGIONAL FOR POST OP PAIN   . INSERTION OF MESH N/A 10/16/2016   Procedure: INSERTION OF MESH;  Surgeon: Kinsinger, Arta Bruce, MD;  Location: [place];  Service: General;  Laterality: N/A;  . INSERTION OF MESH Bilateral 03/30/2017   Procedure: INSERTION OF MESH;  Surgeon: Kieth Brightly Arta Bruce, MD;  [facility];  Service: General;  Laterality: Bilateral;  GENERAL COMBINED WITH REGIONAL FOR POST OP PAIN   . IR PARACENTESIS  08/19/2016  . IR PARACENTESIS  08/26/2016  . IR PARACENTESIS  09/02/2016  . IR PARACENTESIS  09/09/2016  . IR PARACENTESIS  09/16/2016  . IR PARACENTESIS  10/03/2016  . IR PARACENTESIS  10/11/2016  . IR RADIOLOGIST EVAL & MGMT  09/29/2016  . IR RADIOLOGIST EVAL & MGMT  12/21/2016  . IR TIPS  10/11/2016  .  RADIOLOGY WITH ANESTHESIA N/A 10/11/2016   Procedure: TIPS;  Surgeon: Arne Cleveland, MD;  Location: [place];  Service: Radiology;  Laterality: N/A;  . UMBILICAL HERNIA REPAIR N/A 10/16/2016   Procedure: HERNIA REPAIR UMBILICAL ADULT;  Surgeon: Kinsinger, Arta Bruce, MD;  Location: [place];  Service: General;  Laterality: N/A;     Home Medications:  Prior to Admission medications   Medication Sig Start Date End Date Taking? Authorizing Provider  ADVAIR DISKUS 500-50 MCG/DOSE AEPB INHALE 1 PUFF TWO TIMES  DAILY 01/23/17   Shirley, [place], DO  allopurinol (ZYLOPRIM) 300 MG tablet TAKE 1 TABLET BY MOUTH  DAILY 02/20/17   Shirley, [place], DO  bisoprolol  (ZEBETA) 10 MG tablet Take 1 tablet (10 mg total) by mouth daily. 01/12/17   Almyra Deforest, PA  Cholecalciferol (VITAMIN D-3) 5000 units TABS Take 5,000 Units daily by mouth.     [provider]  ferrous sulfate 325 (65 FE) MG tablet Take 1 tablet (325 mg total) by mouth 2 (two) times daily with a meal. 10/21/14   Ronnie Doss M, DO  furosemide (LASIX) 40 MG tablet Take 40 mg daily by mouth.  12/08/16   [provider]  lactulose (CHRONULAC) 10 GM/15ML solution Take 15 mLs (10 g total) by mouth 2 (two) times daily as needed (need to ensure 2 soft BM in a day.). 10/23/16   Lavina Hamman, MD  montelukast (SINGULAIR) 10 MG tablet TAKE 1 TABLET BY MOUTH AT  BEDTIME Patient taking differently: TAKE 10 MG BY MOUTH AT  BEDTIME 03/08/17   Shirley, [place], DO  Nutritional Supplements (ENSURE COMPLETE SHAKE) LIQD Take 1 Container by mouth daily. Patient taking differently: Take 1 Container 2 (two) times daily by mouth.  03/04/15   Katheren Shams, DO  oxyCODONE (OXY IR/ROXICODONE) 5 MG immediate release tablet Take 1 tablet (5 mg total) every 4 (four) hours as needed by mouth for moderate pain. 03/31/17   Kinsinger, Arta Bruce, MD  PROAIR HFA 108 [phone] Base) MCG/ACT inhaler Use 2 puffs every 4 hours  as needed for wheezing or  shortness of breath 11/26/15   Veatrice Bourbon, MD    Inpatient Medications: Scheduled Meds:  Continuous Infusions:  PRN Meds:   Allergies:   No Known Allergies  Social History:   Social History   Socioeconomic History  . Marital status: Divorced    Spouse name: Not on file  . Number of children: 5  . Years of education: 10  . Highest education level: Not on file  Social Needs  . Financial resource strain: Not on file  . Food insecurity - worry: Not on file  . Food insecurity - inability: Not on file  . Transportation needs - medical: Not on file  . Transportation needs - non-medical: Not on file  Occupational History  . Occupation: Retired- truck  Geophysical data processor  . Smoking status: Never Smoker  . Smokeless tobacco: Current User    Types: Snuff, Chew  Substance and Sexual Activity  . Alcohol use: No    Comment: quit drinking "over 2 yr ago" (10/11/2016)  . Drug use: No  . Sexual activity: Not Currently  Other Topics Concern  . Not on file  Social History Narrative   Lives with nephew and niece in [place] home.     Tobacco: snuff all day (can last two days. Never smoked.       Hobbies: cooking, sleeping, going to store   Pets:  Dog, Bean  Family History:    Family History  Problem Relation Age of Onset  . Other Mother        died when pt was only 21 - unknown cause.  . Other Father        deceased.  . Asthma Sister   . Heart disease Sister        multiple stents.  . Asthma Brother   . Hypertension Brother   . Asthma Son   . Asthma Brother   . Hypertension Brother      ROS:  Please see the history of present illness.  ROS  All other ROS reviewed and negative.     Physical Exam/Data:   Vitals:   04/11/17 1719 04/11/17 1721 04/11/17 1722  BP:   (!) 148/59  Pulse: (!) 46    Resp: 20    Temp: 97.6 F (36.4 C)    TempSrc: Oral    SpO2: 97%    Weight:  196 lb (88.9 kg)   Height:  5\' 10"  (1.778 m)    No intake or output data in the 24 hours ending 04/11/17 1722 Filed Weights   04/11/17 1721  Weight: 196 lb (88.9 kg)   Body mass index is 28.12 kg/m.  General appearance: alert, cooperative and no distress Neck: JVD to angle Lungs: clear to auscultation bilaterally Heart: regular rate and rhythm Abdomen: obese, surgical site intact, scrotal and penile edema Extremities: 3+ LE edema Pulses: diminnished Skin: cool, dry Neurologic: Grossly normal  EKG:  The EKG was personally reviewed and demonstrates:  NSR, SB 45  Laboratory Data:  ChemistryNo results for input(s): NA, K, CL, CO2, GLUCOSE, BUN, CREATININE, CALCIUM, GFRNONAA, GFRAA, ANIONGAP in the last 168 hours.  No results for input(s):  PROT, ALBUMIN, AST, ALT, ALKPHOS, BILITOT in the last 168 hours. HematologyNo results for input(s): WBC, RBC, HGB, HCT, MCV, MCH, MCHC, RDW, PLT in the last 168 hours. Cardiac EnzymesNo results for input(s): TROPONINI in the last 168 hours. No results for input(s): TROPIPOC in the last 168 hours.  BNPNo results for input(s): BNP, PROBNP in the last 168 hours.  DDimer No results for input(s): DDIMER in the last 168 hours.  Radiology/Studies:  No results found.  Assessment and Plan:   Anasarca Pt has scrotal and penile edema, 40 lb wgt gain in last 3 months  Inguinal hernia S/P inguinal hernia repair with 4L as ascites removed 40/98/11  Alcoholic cirrhosis of liver with ascites ([place]) TIPS procedure on 10/11/16. 4L taken off 03/30/17  Acute-on-chronic kidney injury ([place]) SCr 2.14 at discharge 03/31/17  CKD (chronic kidney disease) stage 3, GFR 30-59 ml/min Baseline SCr 1.4  Typical atrial flutter ([facility]) NSR today- HR 45  Plan: Pt seen by Dr Stanford Breed and myself in the office today. Plan is to have the hospitalitis admit. We will consult. Check echo, IV diurese, follow renal.    For questions or updates, please contact [place] Please consult [email or web address] for contact info under Cardiology/STEMI.   Signed, Kerin Ransom, PA-C  04/11/2017 5:22 PM   As above, patient seen and examined. Briefly he is a 76 year old male with past medical history of cirrhosis, portal hypertension, atrial flutter, chronic stage III kidney disease for evaluation of anasarca. Patient has had previous TIPS for cirrhosis/portal hypertension. For the last 1 month he has noted progressive lower extremity edema bilaterally, scrotal edema, weight gain and abdominal distention. He denies dyspnea, chest pain, palpitations or syncope. On examination he has abdominal distention, 3-4+ bilateral lower extremity edema,  massive scrotal edema  Electrocardiogram shows sinus bradycadia  anasarca-patient is markedly  volume overloaded. I think this is likely secondary to his cirrhosis/portal hypertension but we have no history of echocardiogram and LV function is unknown. Would begin IV diuresis with Lasix 80 mg IV twice a day. Follow renal function closely. Check echocardiogram for LV systolic/diastolic function and RV function. Would check albumin. Note patient remains in sinus rhythm. May need GI eval.  Kirk Ruths, MD

## 2017-04-11 NOTE — ED Provider Notes (Signed)
[facility] EMERGENCY DEPARTMENT Provider Note   CSN: [id number] Arrival date & time: 04/11/17  1654     History   Chief Complaint Chief Complaint  Patient presents with  . Leg Swelling    HPI Donald Moreno is a 76 y.o. male.  HPI   76 year old male with past medical history as below including chronic alcoholism, cirrhosis, congestive heart failure, who presents from cardiology clinic for hypervolemia.  Patient reports that he has gained over 20 pounds in the last 2 weeks.  He has had associated lower extremity edema, scrotal edema, and occasional shortness of breath.  He has also noticed some orthopnea.  But he feels like his abdomen is mildly swollen as well, though he denies any abdominal pain.  No nausea or vomiting.  No other medical complaints.  Denies any fevers.  He went to his primary doctor today who sent him to the cardiologist, who sent him here for admission.  Past Medical History:  Diagnosis Date  . Arthritis    "all over"  . Asthma   . Chronic kidney disease    "I see a kidney dr @ [facility]" (10/11/2016)  . Chronic lower back pain    "turned a truck over a long time ago" (10/11/2016)  . ETOH abuse   . Gout   . Hypertension   . Liver cirrhosis ([place]) 2015  . Pneumonia    "long long time ago" (10/11/2016)  . Spontaneous bacterial peritonitis ([place]) 07/01/2015    Patient Active Problem List   Diagnosis Date Noted  . Scrotal swelling 04/11/2017  . Blood in stool 04/11/2017  . Acute-on-chronic kidney injury ([place]) 04/11/2017  . Anasarca 04/11/2017  . Volume overload 04/11/2017  . Bilateral inguinal hernia 03/30/2017  . Hematuria 01/30/2017  . Alcoholic cirrhosis of liver with ascites ([place])   . Typical atrial flutter ([place])   . CKD (chronic kidney disease) stage 3, GFR 30-59 ml/min (HCC) 05/27/2015  . At risk for decreased bone density 03/04/2015  . Inguinal hernia 10/28/2014  . Preventative health care 11/11/2011  . Asthma  01/29/2010  . NICOTINE ADDICTION 04/30/2007  . Gout 07/13/2006  . HYPERTENSION, BENIGN SYSTEMIC 07/13/2006    Past Surgical History:  Procedure Laterality Date  . INGUINAL HERNIA REPAIR Bilateral 03/30/2017   Procedure: OPEN BILATERAL INGUINAL HERNIA REPAIR WITH MESH;  Surgeon: Kinsinger, Arta Bruce, MD;  Location: [facility];  Service: General;  Laterality: Bilateral;  GENERAL COMBINED WITH REGIONAL FOR POST OP PAIN   . INSERTION OF MESH N/A 10/16/2016   Procedure: INSERTION OF MESH;  Surgeon: Kinsinger, Arta Bruce, MD;  Location: [facility];  Service: General;  Laterality: N/A;  . INSERTION OF MESH Bilateral 03/30/2017   Procedure: INSERTION OF MESH;  Surgeon: Kieth Brightly Arta Bruce, MD;  Location: [facility];  Service: General;  Laterality: Bilateral;  GENERAL COMBINED WITH REGIONAL FOR POST OP PAIN   . IR PARACENTESIS  08/19/2016  . IR PARACENTESIS  08/26/2016  . IR PARACENTESIS  09/02/2016  . IR PARACENTESIS  09/09/2016  . IR PARACENTESIS  09/16/2016  . IR PARACENTESIS  10/03/2016  . IR PARACENTESIS  10/11/2016  . IR RADIOLOGIST EVAL & MGMT  09/29/2016  . IR RADIOLOGIST EVAL & MGMT  12/21/2016  . IR TIPS  10/11/2016  . RADIOLOGY WITH ANESTHESIA N/A 10/11/2016   Procedure: TIPS;  Surgeon: Arne Cleveland, MD;  Location: [facility];  Service: Radiology;  Laterality: N/A;  . UMBILICAL HERNIA REPAIR N/A 10/16/2016   Procedure: HERNIA REPAIR UMBILICAL  ADULT;  Surgeon: Kinsinger, Arta Bruce, MD;  Location: [facility];  Service: General;  Laterality: N/A;       Home Medications    Prior to Admission medications   Medication Sig Start Date End Date Taking? Authorizing Provider  ADVAIR DISKUS 500-50 MCG/DOSE AEPB INHALE 1 PUFF TWO TIMES  DAILY 01/23/17  Yes Enid Derry, [place], DO  allopurinol (ZYLOPRIM) 300 MG tablet TAKE 1 TABLET BY MOUTH  DAILY 02/20/17  Yes Enid Derry, [place], DO  bisoprolol (ZEBETA) 10 MG tablet Take 1 tablet (10 mg total) by mouth daily. 01/12/17  Yes Almyra Deforest, PA  ferrous sulfate 325 (65 FE) MG tablet Take  1 tablet (325 mg total) by mouth 2 (two) times daily with a meal. 10/21/14  Yes Gottschalk, Ashly M, DO  furosemide (LASIX) 40 MG tablet Take 40 mg daily by mouth.  12/08/16  Yes [provider]  montelukast (SINGULAIR) 10 MG tablet TAKE 1 TABLET BY MOUTH AT  BEDTIME Patient taking differently: TAKE 10 MG BY MOUTH AT  BEDTIME 03/08/17  Yes Enid Derry, [place], DO  Cholecalciferol (VITAMIN D-3) 5000 units TABS Take 5,000 Units daily by mouth.     [provider]  lactulose (CHRONULAC) 10 GM/15ML solution Take 15 mLs (10 g total) by mouth 2 (two) times daily as needed (need to ensure 2 soft BM in a day.). 10/23/16   Lavina Hamman, MD  Nutritional Supplements (ENSURE COMPLETE SHAKE) LIQD Take 1 Container by mouth daily. Patient taking differently: Take 1 Container 2 (two) times daily by mouth.  03/04/15   Katheren Shams, DO  oxyCODONE (OXY IR/ROXICODONE) 5 MG immediate release tablet Take 1 tablet (5 mg total) every 4 (four) hours as needed by mouth for moderate pain. 03/31/17   Kinsinger, Arta Bruce, MD  PROAIR HFA 108 [phone] Base) MCG/ACT inhaler Use 2 puffs every 4 hours  as needed for wheezing or  shortness of breath 11/26/15   Veatrice Bourbon, MD    Family History Family History  Problem Relation Age of Onset  . Other Mother        died when pt was only 23 - unknown cause.  . Other Father        deceased.  . Asthma Sister   . Heart disease Sister        multiple stents.  . Asthma Brother   . Hypertension Brother   . Asthma Son   . Asthma Brother   . Hypertension Brother     Social History Social History   Tobacco Use  . Smoking status: Never Smoker  . Smokeless tobacco: Current User    Types: Snuff, Chew  Substance Use Topics  . Alcohol use: No    Comment: quit drinking "over 2 yr ago" (10/11/2016)  . Drug use: No     Allergies   Patient has no known allergies.   Review of Systems Review of Systems  Constitutional: Positive for fatigue.  Respiratory:  Positive for shortness of breath.   Cardiovascular: Positive for leg swelling.  Neurological: Positive for weakness.  All other systems reviewed and are negative.    Physical Exam Updated Vital Signs BP 103/62   Pulse (!) 48   Temp 97.6 F (36.4 C) (Oral)   Resp (!) 24   Ht 5\' 10"  (1.778 m)   Wt 88.9 kg (196 lb)   SpO2 100%   BMI 28.12 kg/m   Physical Exam  Constitutional: He is oriented to person, place, and time. He appears well-developed  and well-nourished. No distress.  HENT:  Head: Normocephalic and atraumatic.  Eyes: Conjunctivae are normal.  Neck: Neck supple. JVD (JVD to the angle of the jaw) present.  Cardiovascular: Normal rate, regular rhythm and normal heart sounds. Exam reveals no friction rub.  No murmur heard. Pulmonary/Chest: Effort normal. No respiratory distress. He has no wheezes. He has rales (Bibasilar).  Abdominal: Soft. He exhibits distension.  Distended, mild fluid wave  Musculoskeletal: He exhibits edema (3+, severe bilateral pitting edema).  Neurological: He is alert and oriented to person, place, and time. He exhibits normal muscle tone.  Skin: Skin is warm. Capillary refill takes less than 2 seconds.  Psychiatric: He has a normal mood and affect.  Nursing note and vitals reviewed.    ED Treatments / Results  Labs (all labs ordered are listed, but only abnormal results are displayed) Labs Reviewed  BASIC METABOLIC PANEL - Abnormal; Notable for the following components:      Result Value   BUN 30 (*)    Creatinine, Ser 1.90 (*)    Calcium 8.5 (*)    GFR calc non Af Amer 33 (*)    GFR calc Af Amer 38 (*)    All other components within normal limits  CBC - Abnormal; Notable for the following components:   RBC 3.32 (*)    Hemoglobin 11.7 (*)    HCT 35.3 (*)    MCV 106.3 (*)    MCH 35.2 (*)    Platelets 133 (*)    All other components within normal limits  BRAIN NATRIURETIC PEPTIDE - Abnormal; Notable for the following components:   B  Natriuretic Peptide 1,699.9 (*)    All other components within normal limits  HEPATIC FUNCTION PANEL - Abnormal; Notable for the following components:   Albumin 2.9 (*)    Total Bilirubin 1.4 (*)    All other components within normal limits  CBC - Abnormal; Notable for the following components:   RBC 3.24 (*)    Hemoglobin 11.5 (*)    HCT 34.8 (*)    MCV 107.4 (*)    MCH 35.5 (*)    Platelets 125 (*)    All other components within normal limits  CREATININE, SERUM - Abnormal; Notable for the following components:   Creatinine, Ser 1.93 (*)    GFR calc non Af Amer 33 (*)    GFR calc Af Amer 38 (*)    All other components within normal limits  MAGNESIUM  I-STAT TROPONIN, ED    EKG  EKG Interpretation  Date/Time:  Tuesday April 11 2017 17:30:12 EST Ventricular Rate:  42 PR Interval:    QRS Duration: 96 QT Interval:  521 QTC Calculation: 436 R Axis:   66 Text Interpretation:  Sinus bradycardia Atrial premature complex Since last EKG, sinus bradycardia has replaced atrial fibrillation Confirmed by Duffy Bruce [phone]) on 04/11/2017 9:07:14 PM       Radiology Dg Chest 2 View  Result Date: 04/11/2017 CLINICAL DATA:  Left groin swelling.  Recent inguinal hernia repair. EXAM: CHEST  2 VIEW COMPARISON:  01/23/2017 and 07/01/2015 FINDINGS: Chronic lung changes and scarring in the right lung. Old fracture involving the right seventh rib. Persistent patchy densities in the right hilum and medial right lower lung. Left upper lung is clear. Heart and mediastinum are stable. IMPRESSION: Chronic changes throughout the right hemithorax. There has been minimal change since the previous examination. Hazy densities in the right hilum and right lower lung region could represent mild  edema or atelectasis. Electronically Signed   By: Markus Daft M.D.   On: 04/11/2017 18:10    Procedures Procedures (including critical care time)  Medications Ordered in ED Medications    mometasone-formoterol (DULERA) 200-5 MCG/ACT inhaler 2 puff (2 puffs Inhalation Not Given 04/11/17 2310)  allopurinol (ZYLOPRIM) tablet 300 mg (not administered)  ferrous sulfate tablet 325 mg (not administered)  lactulose (CHRONULAC) 10 GM/15ML solution 10 g (not administered)  montelukast (SINGULAIR) tablet 10 mg (not administered)  feeding supplement (ENSURE ENLIVE) (ENSURE ENLIVE) liquid 237 mL (not administered)  albuterol (PROVENTIL HFA;VENTOLIN HFA) 108 (90 Base) MCG/ACT inhaler 2 puff (not administered)  heparin injection 5,000 Units (not administered)  acetaminophen (TYLENOL) tablet 650 mg (not administered)    Or  acetaminophen (TYLENOL) suppository 650 mg (not administered)  furosemide (LASIX) injection 40 mg (40 mg Intravenous Given 04/11/17 2052)  ipratropium-albuterol (DUONEB) 0.5-2.5 (3) MG/3ML nebulizer solution 3 mL (3 mLs Nebulization Given 04/11/17 2343)     Initial Impression / Assessment and Plan / ED Course  I have reviewed the triage vital signs and the nursing notes.  Pertinent labs & imaging results that were available during my care of the patient were reviewed by me and considered in my medical decision making (see chart for details).     76 year old male here with progressively worsening bilateral lower extremity edema and generalized weakness.  He is overtly hypervolemic on exam with significant pitting edema and markedly elevated BNP.  Suspect this is secondary to decompensated heart failure and cirrhosis.I discussed with cardiology.  Patient will be admitted to the medicine service and they will see the patient.  He appears to be in a sinus bradycardia rhythm.  I discussed this with the cardiologist on-call.  No signs of hemodynamic compromise, and suspect this is secondary to his beta-blockade.  Would recommend holding beta-blocker, otherwise cardiology will see the patient in the morning.  Final Clinical Impressions(s) / ED Diagnoses   Final diagnoses:   [place]    ED Discharge Orders    None       Duffy Bruce, MD 04/12/17 [phone]

## 2017-04-11 NOTE — Assessment & Plan Note (Signed)
Likely due to internal hemorrhoids given normal colonoscopy in 2016, bright red blood on paper, and no pain during BM. Patient concerned and would like further evaluation. He will be going to the [facility] office today to schedule further work up.

## 2017-04-11 NOTE — Assessment & Plan Note (Signed)
Baseline SCr 1.4

## 2017-04-11 NOTE — Progress Notes (Signed)
04/11/2017 Donald Moreno   1940/08/13  [id number]    Primary Physician Shirley, [place], DO Primary Cardiologist: Dr Sallyanne Kuster  HPI:  . 76 y.o. AA male with PMH of advanced liver cirrhosis with portal HTN and ascites, atrial flutter with RVR in the past, CKD stage 3,and a h/o prior EtOH abuse (quit for 3-4 years). He was treated with Amiodarone for rate control and developed a junctional bradycardia, therefore medication was discontinued. He has done well on Bisoprolol. He was hospitalized in June 2018 for incarcerated umbilical hernia and underwent urgent repair of 10/16/2016. He also undergone TIPS surgery. He did still undergo paracentesis on 10/23/2016 with removal of 3.7 L of amber fluid. We saw him in the office 12/29/16 and was in atrial flutter with 2:1conduction and Bisoprolol was added. This was increased to 10 mg 01/11/17. He is not on anticoagulation secondary to advanced liver disease. He has never had an echo.  He was recently admitted for bilateral inguinal hernia repair 03/30/17. 4L of ascites was taken off. The pt had a bump in his SCr post op to 2.1. He saw his PCP today. He reported he had significant scrotal edema since surgery. His PCP thought he was going to see his surgeon today but he actually had an appointment with [place], his surgeon won't see him till Dec 7th. The pt tells me he has painful scrotal and penile edema. In reviewing his records his wgt is up 40 lbs in the last three months. The pt denies DOE.       Current Outpatient Medications  Medication Sig Dispense Refill  . ADVAIR DISKUS 500-50 MCG/DOSE AEPB INHALE 1 PUFF TWO TIMES  DAILY 180 each 1  . allopurinol (ZYLOPRIM) 300 MG tablet TAKE 1 TABLET BY MOUTH  DAILY 60 tablet 1  . bisoprolol (ZEBETA) 10 MG tablet Take 1 tablet (10 mg total) by mouth daily. 90 tablet 1  . Cholecalciferol (VITAMIN D-3) 5000 units TABS Take 5,000 Units daily by mouth.     . ferrous sulfate 325 (65 FE) MG tablet Take 1 tablet (325 mg  total) by mouth 2 (two) times daily with a meal. 60 tablet 3  . furosemide (LASIX) 40 MG tablet Take 40 mg daily by mouth.     . lactulose (CHRONULAC) 10 GM/15ML solution Take 15 mLs (10 g total) by mouth 2 (two) times daily as needed (need to ensure 2 soft BM in a day.). 240 mL 0  . montelukast (SINGULAIR) 10 MG tablet TAKE 1 TABLET BY MOUTH AT  BEDTIME (Patient taking differently: TAKE 10 MG BY MOUTH AT  BEDTIME) 90 tablet 1  . Nutritional Supplements (ENSURE COMPLETE SHAKE) LIQD Take 1 Container by mouth daily. (Patient taking differently: Take 1 Container 2 (two) times daily by mouth. ) 237 mL 11  . oxyCODONE (OXY IR/ROXICODONE) 5 MG immediate release tablet Take 1 tablet (5 mg total) every 4 (four) hours as needed by mouth for moderate pain. 30 tablet 0  . PROAIR HFA 108 (90 Base) MCG/ACT inhaler Use 2 puffs every 4 hours  as needed for wheezing or  shortness of breath 51 g 3   No current facility-administered medications for this visit.     No Known Allergies  Past Medical History:  Diagnosis Date  . Arthritis    "all over"  . Asthma   . Chronic kidney disease    "I see a kidney dr @ [facility]" (10/11/2016)  . Chronic lower back pain    "turned  a truck over a long time ago" (10/11/2016)  . ETOH abuse   . Gout   . Hypertension   . Liver cirrhosis ([place]) 2015  . Pneumonia    "long long time ago" (10/11/2016)  . Spontaneous bacterial peritonitis ([place]) 07/01/2015    Social History   Socioeconomic History  . Marital status: Divorced    Spouse name: Not on file  . Number of children: 5  . Years of education: 10  . Highest education level: Not on file  Social Needs  . Financial resource strain: Not on file  . Food insecurity - worry: Not on file  . Food insecurity - inability: Not on file  . Transportation needs - medical: Not on file  . Transportation needs - non-medical: Not on file  Occupational History  . Occupation: Retired- truck Geophysical data processor  . Smoking  status: Never Smoker  . Smokeless tobacco: Current User    Types: Snuff, Chew  Substance and Sexual Activity  . Alcohol use: No    Comment: quit drinking "over 2 yr ago" (10/11/2016)  . Drug use: No  . Sexual activity: Not Currently  Other Topics Concern  . Not on file  Social History Narrative   Lives with nephew and niece in [place] home.     Tobacco: snuff all day (can last two days. Never smoked.       Hobbies: cooking, sleeping, going to store   Pets:  Dog, Bean     Family History  Problem Relation Age of Onset  . Other Mother        died when pt was only 75 - unknown cause.  . Other Father        deceased.  . Asthma Sister   . Heart disease Sister        multiple stents.  . Asthma Brother   . Hypertension Brother   . Asthma Son   . Asthma Brother   . Hypertension Brother      Review of Systems: General: negative for chills, fever, night sweats or weight changes.  Cardiovascular: negative for chest pain, dyspnea on exertion, orthopnea, palpitations Dermatological: negative for rash Respiratory: negative for cough or wheezing Urologic: negative for hematuria Abdominal: negative for nausea, vomiting, diarrhea, bright red blood per rectum, melena, or hematemesis Neurologic: negative for visual changes, syncope, or dizziness All other systems reviewed and are otherwise negative except as noted above.    Blood pressure (!) 144/55, pulse (!) 46, height 5\' 10"  (1.778 m), weight 196 lb (88.9 kg).  General appearance: alert, cooperative and no distress Neck: JVD to angle Lungs: clear to auscultation bilaterally Heart: regular rate and rhythm Abdomen: obese, surgical site intact, scrotal and penile edema Extremities: 3+ LE edema Pulses: diminnished Skin: cool, dry Neurologic: Grossly normal  EKG NSR, HR 45  ASSESSMENT AND PLAN:   Anasarca Pt has scrotal and penile edema, 40 lb wgt gain in last 3 months  Inguinal hernia S/P inguinal hernia repair with 4L as  ascites removed 10/93/23  Alcoholic cirrhosis of liver with ascites (HCC) TIPS procedure on 10/11/16. 4L taken off 03/30/17  Acute-on-chronic kidney injury ([place]) SCr 2.14 at discharge 03/31/17  CKD (chronic kidney disease) stage 3, GFR 30-59 ml/min Baseline SCr 1.4  Typical atrial flutter (HCC) NSR today- HR 45   PLAN  Pt seen by Dr Stanford Breed and myself in the office today. Plan is to have the hospitalitis admit. We will consult. Check echo, IV diurese, follow renal.  Kerin Ransom PA-C 04/11/2017 4:37 PM

## 2017-04-11 NOTE — Assessment & Plan Note (Signed)
S/P inguinal hernia repair with 4L as ascites removed 03/30/17

## 2017-04-11 NOTE — ED Notes (Signed)
Pt ambulatory from restroom back to bed with steady gait

## 2017-04-11 NOTE — ED Notes (Signed)
Pt reports he was told by his cardiologist to come to the ED for inpatient treatment for fluid overload. Pt denies CP, SOB, or any pain. Pt reports he has gained approx 30 lbs in the last 15-20 days and has had significant increase in peripheral edema. Mild abdominal swelling, JVD noted.

## 2017-04-11 NOTE — Assessment & Plan Note (Signed)
Sent from his PCP's office with scrotal edema, 40 lb wgt gain in the last 3 months

## 2017-04-11 NOTE — Assessment & Plan Note (Signed)
Patient with recent bilateral inguinal hernia repair. No signs of emergent causes on physical exam. Patient to follow up with surgery today at 2:30pm. Areas of surgical incision appear to be healing well.

## 2017-04-11 NOTE — ED Triage Notes (Signed)
Pt sent by his doctor for bilateral lower extremity swelling. Pt denies chest pain, shortness of breath. Recent hernia surgery.

## 2017-04-11 NOTE — Progress Notes (Signed)
Subjective:    Patient ID: Donald Moreno, male    DOB: 06/06/1940, 76 y.o.   MRN: [id number]   CC: Swollen scrotum  HPI:  Swollen scrotum: - Patient had bilateral inguinal hernia repair on 03/30/2017. He has follow up with his surgeon scheduled today for 2:30 pm where he is to have a recheck of his kidney function and surgical sites. - Patient reporting the swelling started after the surgery and has just gotten worse. Reports it is painful when he is sitting. Denies fevers.    Hematuria: -He was to have a follow up with urology in September, but he did not hear from them. He would like to have the appointment made for his previous hematuria and increasing PSA.  -Reports that he is having trouble with urinating and does not feel that he is emptying his bladder.  Blood in Stool: - Patient requesting another colonoscopy due to recent findings of blood I stool. Normal colonoscopy in 11/2014. - Reporting he has noticed blood on the paper the past few weeks when he has been having a bm. He reports that it has been kind of hard but he is having daily BM. He has also been straining. Denies any pain with BM. Denies seeing blood in the toilet. No history of hemorrhoids. No family history of colon cancer.   Smoking status reviewed  Review of Systems  Per HPI, else denies recent illness, fever, headache, changes in vision, chest pain, shortness of breath, abdominal pain, N/V/D, weakness   Patient Active Problem List   Diagnosis Date Noted  . Scrotal swelling 04/11/2017  . Blood in stool 04/11/2017  . Bilateral inguinal hernia 03/30/2017  . Hematuria 01/30/2017  . Alcoholic cirrhosis of liver with ascites ([place])   . Typical atrial flutter ([place])   . CKD (chronic kidney disease) stage 3, GFR 30-59 ml/min (HCC) 05/27/2015  . At risk for decreased bone density 03/04/2015  . Inguinal hernia 10/28/2014  . Preventative health care 11/11/2011  . Asthma 01/29/2010  . NICOTINE ADDICTION 04/30/2007   . Gout 07/13/2006  . HYPERTENSION, BENIGN SYSTEMIC 07/13/2006     Objective:  BP 138/60   Pulse 85   Temp 98.7 F (37.1 C) (Oral)   Ht 5\' 10"  (1.778 m)   Wt 195 lb (88.5 kg)   SpO2 97%   BMI 27.98 kg/m  Vitals and nursing note reviewed  General: NAD, pleasant Cardiac: RRR, normal heart sounds, no murmurs Respiratory: CTAB, normal effort Abdomen: soft, nontender, nondistended. Bowel sounds present Extremities: no edema or cyanosis. WWP. Scrotum: Examined with chaperone in room. BL diffuse scrotal swelling, nontender to palpation, also noted penile swelling. No firm, nodule noted on either testicle. No discharge noted.  Skin: warm and dry, no rashes noted Neuro: alert and oriented, no focal deficits   Assessment & Plan:    Hematuria Patient will follow up with urology from previous appointment that showed trace blood on UA with no signs of UTI. PSA 7.5 on 01/30/2017. Patient still having incomplete bladder emptying. Did not test today due to patient calling for appointment with urology, will defer work up to them.   Scrotal swelling Patient with recent bilateral inguinal hernia repair. No signs of emergent causes on physical exam. Patient to follow up with surgery today at 2:30pm. Areas of surgical incision appear to be healing well.   Blood in stool Likely due to internal hemorrhoids given normal colonoscopy in 2016, bright red blood on paper, and no pain during BM. Patient concerned  and would like further evaluation. He will be going to the [place] office today to schedule further work up.     [place] Nuala Chiles, DO Family Medicine Resident PGY-1

## 2017-04-11 NOTE — Assessment & Plan Note (Signed)
Pt has scrotal and penile edema, 40 lb wgt gain in last 3 months

## 2017-04-11 NOTE — Patient Instructions (Signed)
Thank you for coming to see me today. It was a pleasure! Today we talked about:   Your swollen scrotum. Please see your surgeon today at 2:30 pm.   Please call: [facility]: [phone] for your blood in the urine and elevated PSA.  [facility]: [phone] for the blood in your stool.   If you have any questions or concerns, please do not hesitate to call the office at [phone].  Take Care,   [place] Diedra Sinor, DO

## 2017-04-11 NOTE — Assessment & Plan Note (Addendum)
Patient will follow up with urology from previous appointment that showed trace blood on UA with no signs of UTI. PSA 7.5 on 01/30/2017. Patient still having incomplete bladder emptying. Did not test today due to patient calling for appointment with urology, will defer work up to them.

## 2017-04-11 NOTE — ED Notes (Signed)
Pt reports groin swelling and a 30lb weight gain in "a short period of time".

## 2017-04-11 NOTE — Assessment & Plan Note (Signed)
NSR today- HR 45

## 2017-04-11 NOTE — ED Notes (Signed)
ED Provider at bedside. 

## 2017-04-12 ENCOUNTER — Other Ambulatory Visit: Payer: Self-pay

## 2017-04-12 ENCOUNTER — Encounter (HOSPITAL_COMMUNITY): Payer: Self-pay | Admitting: Family Medicine

## 2017-04-12 ENCOUNTER — Inpatient Hospital Stay (HOSPITAL_COMMUNITY): Payer: Medicare Other

## 2017-04-12 DIAGNOSIS — I509 Heart failure, unspecified: Secondary | ICD-10-CM | POA: Insufficient documentation

## 2017-04-12 DIAGNOSIS — R188 Other ascites: Secondary | ICD-10-CM

## 2017-04-12 DIAGNOSIS — K7460 Unspecified cirrhosis of liver: Secondary | ICD-10-CM | POA: Insufficient documentation

## 2017-04-12 DIAGNOSIS — I5032 Chronic diastolic (congestive) heart failure: Secondary | ICD-10-CM | POA: Insufficient documentation

## 2017-04-12 DIAGNOSIS — I483 Typical atrial flutter: Secondary | ICD-10-CM

## 2017-04-12 DIAGNOSIS — R001 Bradycardia, unspecified: Secondary | ICD-10-CM

## 2017-04-12 DIAGNOSIS — I340 Nonrheumatic mitral (valve) insufficiency: Secondary | ICD-10-CM

## 2017-04-12 DIAGNOSIS — T50905A Adverse effect of unspecified drugs, medicaments and biological substances, initial encounter: Secondary | ICD-10-CM

## 2017-04-12 LAB — CBC
HCT: 34.8 % — ABNORMAL LOW (ref 39.0–52.0)
Hemoglobin: 11.5 g/dL — ABNORMAL LOW (ref 13.0–17.0)
MCH: 35.5 pg — AB (ref 26.0–34.0)
MCHC: 33 g/dL (ref 30.0–36.0)
MCV: 107.4 fL — AB (ref 78.0–100.0)
PLATELETS: 125 10*3/uL — AB (ref 150–400)
RBC: 3.24 MIL/uL — AB (ref 4.22–5.81)
RDW: 14.4 % (ref 11.5–15.5)
WBC: 7.2 10*3/uL (ref 4.0–10.5)

## 2017-04-12 LAB — I-STAT TROPONIN, ED: Troponin i, poc: 0.02 ng/mL (ref 0.00–0.08)

## 2017-04-12 LAB — CREATININE, SERUM
Creatinine, Ser: 1.93 mg/dL — ABNORMAL HIGH (ref 0.61–1.24)
GFR calc Af Amer: 38 mL/min — ABNORMAL LOW (ref >60)
GFR calc non Af Amer: 33 mL/min — ABNORMAL LOW (ref >60)

## 2017-04-12 LAB — ECHOCARDIOGRAM COMPLETE
HEIGHTINCHES: 70 in
WEIGHTICAEL: 3040 [oz_av]

## 2017-04-12 MED ORDER — SODIUM CHLORIDE 0.9% FLUSH
3.0000 mL | INTRAVENOUS | Status: DC | PRN
  Administered 2017-04-12: 3 mL via INTRAVENOUS
  Filled 2017-04-12: qty 3

## 2017-04-12 MED ORDER — BISOPROLOL FUMARATE 5 MG PO TABS
2.5000 mg | ORAL_TABLET | Freq: Every day | ORAL | Status: DC
  Administered 2017-04-12: 2.5 mg via ORAL
  Filled 2017-04-12: qty 1

## 2017-04-12 MED ORDER — INFLUENZA VAC SPLIT HIGH-DOSE 0.5 ML IM SUSY
0.5000 mL | PREFILLED_SYRINGE | INTRAMUSCULAR | Status: DC
  Filled 2017-04-12: qty 0.5

## 2017-04-12 MED ORDER — HEPARIN SODIUM (PORCINE) 5000 UNIT/ML IJ SOLN
5000.0000 [IU] | Freq: Three times a day (TID) | INTRAMUSCULAR | Status: DC
  Administered 2017-04-12 – 2017-04-17 (×15): 5000 [IU] via SUBCUTANEOUS
  Filled 2017-04-12 (×14): qty 1

## 2017-04-12 MED ORDER — PNEUMOCOCCAL VAC POLYVALENT 25 MCG/0.5ML IJ INJ
0.5000 mL | INJECTION | INTRAMUSCULAR | Status: DC
  Filled 2017-04-12: qty 0.5

## 2017-04-12 MED ORDER — SODIUM CHLORIDE 0.9% FLUSH
3.0000 mL | Freq: Two times a day (BID) | INTRAVENOUS | Status: DC
  Administered 2017-04-12 – 2017-04-17 (×10): 3 mL via INTRAVENOUS

## 2017-04-12 MED ORDER — ALBUTEROL SULFATE (2.5 MG/3ML) 0.083% IN NEBU: 2.5000 mg | INHALATION_SOLUTION | RESPIRATORY_TRACT | Status: DC | PRN

## 2017-04-12 MED ORDER — FUROSEMIDE 10 MG/ML IJ SOLN
80.0000 mg | Freq: Two times a day (BID) | INTRAMUSCULAR | Status: DC
  Administered 2017-04-12 – 2017-04-15 (×8): 80 mg via INTRAVENOUS
  Filled 2017-04-12 (×8): qty 8

## 2017-04-12 MED ORDER — FUROSEMIDE 10 MG/ML IJ SOLN
40.0000 mg | Freq: Once | INTRAMUSCULAR | Status: AC
  Administered 2017-04-12: 40 mg via INTRAVENOUS
  Filled 2017-04-12: qty 4

## 2017-04-12 NOTE — ED Notes (Signed)
Heart Healthy diet breakfast tray ordered @ 0505.

## 2017-04-12 NOTE — ED Notes (Signed)
Up in chair

## 2017-04-12 NOTE — Progress Notes (Signed)
Family Medicine Teaching Service Daily Progress Note Intern Pager: [phone]  [name] Medical record number: [id number] Date of birth: 16-Aug-1940 Age: 76 y.o. Gender: male  Primary Care Provider: Shirley, [place], DO Consultants: Cardiology Code Status: DNR (intubate only in surgery)  Pt Overview and Major Events to Date:  Donald Moreno is a 76 y.o. male presenting from Cardiology office with fluid overload. PMH is significant for liver cirrhosis, previous aflutter with RVR, CKD3, EtOH abuse (quit 3-4 years ago), asthma, s/p incarcerated umbilical hernia repair 07/2949 and elective bilateral inguinal hernia repair 03/30/2017.   Assessment and Plan:  #Volume overload: CHF vs. Cirrhosis/Portal hypertension s/p TIPS. MELD Score 17. Patient is being diuresised with IV lasix per cardiology. Patient is not on spironolactone. Will discuss why patient is not this therapy. Will consider starting it inpatient. Monitor Cr. Echo is pending.  - IV lasix 80 mg BID, per cards - Strict I/Os - Daily weights - will need scale ordered at d/c - TSH - daily BMP  #Bradycardia: HRs still in 40s. Per cardiology, restart bisoprolol if HR > 50s.  - Cardiology consulted, to see patient in a.m.; to contact overnight if HRs persistently in 30s - monitor on tele - Hold bisoprolol for now  #Asthma: On advair BID, albuterol prn, singulair 10 mg QHS. No evidence of acute exerbation. No dyspnea.  [name] while hospitalized - continue singulair - albuterol prn  #AKI: SCr stable on dieresis.   - Continue to monitor  #Protein malnutrition: In setting of chronic liver disease.  -Continue home ensure shakes BID.  - Nutrition consult  #Macrocytic anemia: PCP suspect internal hemorrhoids, as patient notes BRB on paper and normal colonoscopy in 2016. Hgb stable at baseline (BL ~11-12). - continue home ferrous sulfate 325 mg BID - B12/Folate  - monitor H&H   FEN/GI: HHD PPx: Heparin (due  to AKI)  Disposition: inpatient pending dieuresis   Subjective:  Feels fine today. Patient denies any SOB, CP, abdominal, testicular pain.   Objective: Temp:  [97.6 F (36.4 C)] 97.6 F (36.4 C) (11/27 1719) Pulse Rate:  [36-49] 43 (11/28 0338) Resp:  [13-24] 16 (11/28 0338) BP: (103-176)/(50-81) 107/62 (11/28 0338) SpO2:  [92 %-100 %] 100 % (11/28 0338) Weight:  [196 lb (88.9 kg)] 196 lb (88.9 kg) (11/27 1721) Physical Exam: General: NAD, resting in bed Cardiovascular: RRR, no mrg Respiratory: Brochovesicular breath sounds, NWOB Abdomen: mildly distended, nontender, no peritoneal fluid wave present Extremities: 3+ pitting edema bilaterally, moves extremities spontaneously  Laboratory: Recent Labs  Lab 04/11/17 1744 04/11/17 2351  WBC 7.8 7.2  HGB 11.7* 11.5*  HCT 35.3* 34.8*  PLT 133* 125*   Recent Labs  Lab 04/11/17 1744 04/11/17 2050 04/11/17 2351  NA 141  --   --   K 3.9  --   --   CL 109  --   --   CO2 23  --   --   BUN 30*  --   --   CREATININE 1.90*  --  1.93*  CALCIUM 8.5*  --   --   PROT  --  7.4  --   BILITOT  --  1.4*  --   ALKPHOS  --  91  --   ALT  --  22  --   AST  --  39  --   GLUCOSE 82  --   --     Donald Hollow, MD 04/12/2017, 12:11 PM PGY-1, [place] Intern pager:  [phone], text pages welcome

## 2017-04-12 NOTE — Progress Notes (Signed)
2D Echocardiogram has been performed.  Donald Moreno 04/12/2017, 4:56 PM

## 2017-04-12 NOTE — Progress Notes (Signed)
Pt educated about safety and importance of bed alarm during the night however pt refuses to be on bed alarm. Will continue to round on patient.   Dontee Jaso, RN    

## 2017-04-12 NOTE — ED Notes (Signed)
Awake  Sitting up in the bed eating breakfast. States he feels much better.

## 2017-04-12 NOTE — Consult Note (Signed)
Cardiology Consultation:   Patient ID: Donald Moreno; [id number]; 1940-09-28   Admit date: 04/11/2017 Date of Consult: 04/12/2017  Primary Care Provider: Shirley, [place], DO Primary Cardiologist: Kimberlye Dilger Primary Electrophysiologist:  n/a  Patient Profile:   Donald Moreno is a 76 y.o. male with a hx of persistent atrial flutter and liver cirrhosis who is being seen today for the evaluation of bradycardia at the request of Dr. Andria Frames.  History of Present Illness:   Mr. Norgaard used to have problems with atrial flutter with RVR that was very difficult to rate control, converted to NSR on amiodarone, which was then dc'd for junctional rhythm. Has subsequently had recurrence of atrial flutter and started on bisoprolol. Admitted for fluid overload and noted to have asymptomatic, but marked sinus bradycardia. Bisoprolol was held last night.   Denies dizziness, syncope or excessive fatigue. Presents for marked fluid gain (40 lb). Net diuresis 627mL last 12 hours.  Although he may benefit from nonselective beta blockers for portal HTN, highly selective beta-1 blocker bisoprolol was prescribed due to reactive airway disease. Had reduction in need for paracentesis (was necessary Q 5-6 weeks) after TIPS. Had umbilical hernia repair 88/4166  Past Medical History:  Diagnosis Date  . Arthritis    "all over"  . Asthma   . Chronic kidney disease    "I see a kidney dr @ [facility]" (10/11/2016)  . Chronic lower back pain    "turned a truck over a long time ago" (10/11/2016)  . ETOH abuse   . Gout   . Hypertension   . Liver cirrhosis ([place]) 2015  . Pneumonia    "long long time ago" (10/11/2016)  . Spontaneous bacterial peritonitis ([place]) 07/01/2015    Past Surgical History:  Procedure Laterality Date  . INGUINAL HERNIA REPAIR Bilateral 03/30/2017   Procedure: OPEN BILATERAL INGUINAL HERNIA REPAIR WITH MESH;  Surgeon: Kinsinger, Arta Bruce, MD;  Location: [facility];  Service: General;   Laterality: Bilateral;  GENERAL COMBINED WITH REGIONAL FOR POST OP PAIN   . INSERTION OF MESH N/A 10/16/2016   Procedure: INSERTION OF MESH;  Surgeon: Kinsinger, Arta Bruce, MD;  Location: [facility];  Service: General;  Laterality: N/A;  . INSERTION OF MESH Bilateral 03/30/2017   Procedure: INSERTION OF MESH;  Surgeon: Kieth Brightly Arta Bruce, MD;  Location: [facility];  Service: General;  Laterality: Bilateral;  GENERAL COMBINED WITH REGIONAL FOR POST OP PAIN   . IR PARACENTESIS  08/19/2016  . IR PARACENTESIS  08/26/2016  . IR PARACENTESIS  09/02/2016  . IR PARACENTESIS  09/09/2016  . IR PARACENTESIS  09/16/2016  . IR PARACENTESIS  10/03/2016  . IR PARACENTESIS  10/11/2016  . IR RADIOLOGIST EVAL & MGMT  09/29/2016  . IR RADIOLOGIST EVAL & MGMT  12/21/2016  . IR TIPS  10/11/2016  . RADIOLOGY WITH ANESTHESIA N/A 10/11/2016   Procedure: TIPS;  Surgeon: Arne Cleveland, MD;  Location: [facility];  Service: Radiology;  Laterality: N/A;  . UMBILICAL HERNIA REPAIR N/A 10/16/2016   Procedure: HERNIA REPAIR UMBILICAL ADULT;  Surgeon: Kinsinger, Arta Bruce, MD;  Location: [place];  Service: General;  Laterality: N/A;     Home Medications:  Prior to Admission medications   Medication Sig Start Date End Date Taking? Authorizing Provider  ADVAIR DISKUS 500-50 MCG/DOSE AEPB INHALE 1 PUFF TWO TIMES  DAILY 01/23/17  Yes Enid Derry, [place], DO  allopurinol (ZYLOPRIM) 300 MG tablet TAKE 1 TABLET BY MOUTH  DAILY 02/20/17  Yes Enid Derry, [place], DO  bisoprolol (ZEBETA) 10 MG  tablet Take 1 tablet (10 mg total) by mouth daily. 01/12/17  Yes Almyra Deforest, PA  ferrous sulfate 325 (65 FE) MG tablet Take 1 tablet (325 mg total) by mouth 2 (two) times daily with a meal. 10/21/14  Yes Gottschalk, Ashly M, DO  furosemide (LASIX) 40 MG tablet Take 40 mg daily by mouth.  12/08/16  Yes [provider]  montelukast (SINGULAIR) 10 MG tablet TAKE 1 TABLET BY MOUTH AT  BEDTIME Patient taking differently: TAKE 10 MG BY MOUTH AT  BEDTIME 03/08/17  Yes Enid Derry,  [place], DO  Cholecalciferol (VITAMIN D-3) 5000 units TABS Take 5,000 Units daily by mouth.     [provider]  lactulose (CHRONULAC) 10 GM/15ML solution Take 15 mLs (10 g total) by mouth 2 (two) times daily as needed (need to ensure 2 soft BM in a day.). 10/23/16   Lavina Hamman, MD  Nutritional Supplements (ENSURE COMPLETE SHAKE) LIQD Take 1 Container by mouth daily. Patient taking differently: Take 1 Container 2 (two) times daily by mouth.  03/04/15   Katheren Shams, DO  oxyCODONE (OXY IR/ROXICODONE) 5 MG immediate release tablet Take 1 tablet (5 mg total) every 4 (four) hours as needed by mouth for moderate pain. 03/31/17   Kinsinger, Arta Bruce, MD  PROAIR HFA 108 [phone] Base) MCG/ACT inhaler Use 2 puffs every 4 hours  as needed for wheezing or  shortness of breath 11/26/15   Veatrice Bourbon, MD    Inpatient Medications: Scheduled Meds: . allopurinol  300 mg Oral Daily  . feeding supplement (ENSURE ENLIVE)  237 mL Oral BID BM  . ferrous sulfate  325 mg Oral BID WC  . furosemide  80 mg Intravenous BID  . mometasone-formoterol  2 puff Inhalation BID  . montelukast  10 mg Oral QHS   Continuous Infusions:  PRN Meds: acetaminophen **OR** acetaminophen, albuterol, lactulose  Allergies:   No Known Allergies  Social History:   Social History   Socioeconomic History  . Marital status: Divorced    Spouse name: Not on file  . Number of children: 5  . Years of education: 10  . Highest education level: Not on file  Social Needs  . Financial resource strain: Not on file  . Food insecurity - worry: Not on file  . Food insecurity - inability: Not on file  . Transportation needs - medical: Not on file  . Transportation needs - non-medical: Not on file  Occupational History  . Occupation: Retired- truck Geophysical data processor  . Smoking status: Never Smoker  . Smokeless tobacco: Current User    Types: Snuff, Chew  Substance and Sexual Activity  . Alcohol use: No    Comment:  quit drinking "over 2 yr ago" (10/11/2016)  . Drug use: No  . Sexual activity: Not Currently  Other Topics Concern  . Not on file  Social History Narrative   Lives with nephew and niece in [facility].     Tobacco: snuff all day (can last two days. Never smoked.       Hobbies: cooking, sleeping, going to store   Pets:  Dog, Bean    Family History:    Family History  Problem Relation Age of Onset  . Other Mother        died when pt was only 68 - unknown cause.  . Other Father        deceased.  . Asthma Sister   . Heart disease Sister  multiple stents.  . Asthma Brother   . Hypertension Brother   . Asthma Son   . Asthma Brother   . Hypertension Brother      ROS:  Please see the history of present illness.  ROS  All other ROS reviewed and negative.     Physical Exam/Data:   Vitals:   04/12/17 0100 04/12/17 0115 04/12/17 0200 04/12/17 0338  BP: 111/61 103/62 (!) 131/57 107/62  Pulse: (!) 43 (!) 48 (!) 42 (!) 43  Resp: 16 (!) 24  16  Temp:      TempSrc:      SpO2: 100% 100% 99% 100%  Weight:      Height:        Intake/Output Summary (Last 24 hours) at 04/12/2017 0908 Last data filed at 04/12/2017 0852 Gross per 24 hour  Intake -  Output 650 ml  Net -650 ml   Filed Weights   04/11/17 1721  Weight: 196 lb (88.9 kg)   Body mass index is 28.12 kg/m.  General:  Well nourished, well developed, in no acute distress HEENT: normal Lymph: no adenopathy Neck:8-9 cm JVD Endocrine:  No thryomegaly Vascular: No carotid bruits; FA pulses 2+ bilaterally without bruits  Cardiac:  Bradycardia, normal S1, S2; RRR; no murmur Lungs:  clear to auscultation bilaterally, no wheezing, rhonchi or rales  Abd: soft, nontender, ascites present, as well as scrotal edema Ext: no edema Musculoskeletal:  No deformities, BUE and BLE strength normal and equal Skin: warm and dry  Neuro:  CNs 2-12 intact, no focal abnormalities noted Psych:  Normal affect   EKG:  The EKG was  personally reviewed and demonstrates:  Marked sinus bradycardia Telemetry:  Telemetry was personally reviewed and demonstrates:  Sinus bradycardia  Relevant CV Studies:   Laboratory Data:  Chemistry Recent Labs  Lab 04/11/17 1744 04/11/17 2351  NA 141  --   K 3.9  --   CL 109  --   CO2 23  --   GLUCOSE 82  --   BUN 30*  --   CREATININE 1.90* 1.93*  CALCIUM 8.5*  --   GFRNONAA 33* 33*  GFRAA 38* 38*  ANIONGAP 9  --     Recent Labs  Lab 04/11/17 2050  PROT 7.4  ALBUMIN 2.9*  AST 39  ALT 22  ALKPHOS 91  BILITOT 1.4*   Hematology Recent Labs  Lab 04/11/17 1744 04/11/17 2351  WBC 7.8 7.2  RBC 3.32* 3.24*  HGB 11.7* 11.5*  HCT 35.3* 34.8*  MCV 106.3* 107.4*  MCH 35.2* 35.5*  MCHC 33.1 33.0  RDW 14.1 14.4  PLT 133* 125*   Cardiac EnzymesNo results for input(s): TROPONINI in the last 168 hours.  Recent Labs  Lab 04/11/17 1749  TROPIPOC 0.02    BNP Recent Labs  Lab 04/11/17 1744  BNP 1,699.9*    DDimer No results for input(s): DDIMER in the last 168 hours.  Radiology/Studies:  Dg Chest 2 View  Result Date: 04/11/2017 CLINICAL DATA:  Left groin swelling.  Recent inguinal hernia repair. EXAM: CHEST  2 VIEW COMPARISON:  01/23/2017 and 07/01/2015 FINDINGS: Chronic lung changes and scarring in the right lung. Old fracture involving the right seventh rib. Persistent patchy densities in the right hilum and medial right lower lung. Left upper lung is clear. Heart and mediastinum are stable. IMPRESSION: Chronic changes throughout the right hemithorax. There has been minimal change since the previous examination. Hazy densities in the right hilum and right lower lung region  could represent mild edema or atelectasis. Electronically Signed   By: Markus Daft M.D.   On: 04/11/2017 18:10    Assessment and Plan:   1. Bradycardia: when heart rate is >50, restart lower dose bisoprolol to avoid rebound arrhythmia. 2. Cirrhosis: major cause of volume overload, but may have  a component of right heart failure (consider PHPH). Echo pending. Continue diuretics. Volume overload may be related to recent surgery and holiday meals. 3. Hx of typical atrial flutter  4. Ac on CKD stage 3 (baseline creat 1.4), stable at 1.9 w diuresis.   For questions or updates, please contact [place] Please consult [email or web address] for contact info under Cardiology/STEMI.   Signed, Sanda Klein, MD  04/12/2017 9:08 AM

## 2017-04-12 NOTE — ED Notes (Signed)
Patient with 4+ pitting edema to legs.  States he noticed his scrotum had been swollen as well and they have told me my neck is swollen also.  (No swelling to neck noticed.)

## 2017-04-13 DIAGNOSIS — R601 Generalized edema: Secondary | ICD-10-CM

## 2017-04-13 DIAGNOSIS — I50813 Acute on chronic right heart failure: Secondary | ICD-10-CM

## 2017-04-13 DIAGNOSIS — K766 Portal hypertension: Secondary | ICD-10-CM

## 2017-04-13 DIAGNOSIS — I2721 Secondary pulmonary arterial hypertension: Secondary | ICD-10-CM

## 2017-04-13 LAB — BASIC METABOLIC PANEL
ANION GAP: 5 (ref 5–15)
BUN: 32 mg/dL — AB (ref 6–20)
CALCIUM: 8.3 mg/dL — AB (ref 8.9–10.3)
CO2: 26 mmol/L (ref 22–32)
CREATININE: 1.7 mg/dL — AB (ref 0.61–1.24)
Chloride: 108 mmol/L (ref 101–111)
GFR calc Af Amer: 44 mL/min — ABNORMAL LOW (ref >60)
GFR calc non Af Amer: 38 mL/min — ABNORMAL LOW (ref >60)
GLUCOSE: 78 mg/dL (ref 65–99)
Potassium: 3.9 mmol/L (ref 3.5–5.1)
Sodium: 139 mmol/L (ref 135–145)

## 2017-04-13 LAB — MAGNESIUM: MAGNESIUM: 2.2 mg/dL (ref 1.7–2.4)

## 2017-04-13 LAB — VITAMIN B12: VITAMIN B 12: 353 pg/mL (ref 180–914)

## 2017-04-13 LAB — TSH: TSH: 3.285 u[IU]/mL (ref 0.350–4.500)

## 2017-04-13 LAB — PHOSPHORUS: PHOSPHORUS: 3.7 mg/dL (ref 2.5–4.6)

## 2017-04-13 MED ORDER — SPIRONOLACTONE 25 MG PO TABS
12.5000 mg | ORAL_TABLET | Freq: Every day | ORAL | Status: DC
  Administered 2017-04-13 – 2017-04-17 (×5): 12.5 mg via ORAL
  Filled 2017-04-13 (×5): qty 1

## 2017-04-13 MED ORDER — ADULT MULTIVITAMIN W/MINERALS CH
1.0000 | ORAL_TABLET | Freq: Every day | ORAL | Status: DC
  Administered 2017-04-13 – 2017-04-17 (×5): 1 via ORAL
  Filled 2017-04-13 (×5): qty 1

## 2017-04-13 NOTE — Progress Notes (Signed)
Social Note:  Donald Moreno reports that he is feeling better. He is still having some issues emptying his bladder and blood was found in the bed this morning from his urine. He would like to see a urologist and has an outpatient appointment on 12/7. No other complaints this morning.   I appreciate the great care provided by the admitting team.   [place] Franz Svec, DO

## 2017-04-13 NOTE — Progress Notes (Signed)
Pt educated about safety and importance of bed alarm during the night however pt refuses to be on bed alarm. Will continue to round on patient.   Beaulah Romanek, RN    

## 2017-04-13 NOTE — Progress Notes (Signed)
Progress Note  [name] Date of Encounter: 04/13/2017  Primary Cardiologist: Dr. Sallyanne Kuster  Subjective   States he generally feels fine most of the time - no CP, dyspnea, dizziness, syncope. Edema persists. Did not urinate yesterday as much as he thought he would.  Inpatient Medications    Scheduled Meds: . allopurinol  300 mg Oral Daily  . feeding supplement (ENSURE ENLIVE)  237 mL Oral BID BM  . ferrous sulfate  325 mg Oral BID WC  . furosemide  80 mg Intravenous BID  . heparin subcutaneous  5,000 Units Subcutaneous Q8H  . Influenza vac split quadrivalent PF  0.5 mL Intramuscular Tomorrow-1000  . mometasone-formoterol  2 puff Inhalation BID  . montelukast  10 mg Oral QHS  . multivitamin with minerals  1 tablet Oral Daily  . sodium chloride flush  3 mL Intravenous Q12H  . spironolactone  12.5 mg Oral Daily   Continuous Infusions:  PRN Meds: acetaminophen **OR** acetaminophen, albuterol, lactulose, sodium chloride flush   Vital Signs    Vitals:   04/13/17 0102 04/13/17 0528 04/13/17 0747 04/13/17 0752  BP: (!) 119/54 (!) 132/48  116/71  Pulse: (!) 43 (!) 42  (!) 40  Resp: 18 19  18   Temp: 98.9 F (37.2 C) 98.5 F (36.9 C)  98.4 F (36.9 C)  TempSrc: Oral Oral  Oral  SpO2: 100% 96% 99% 96%  Weight:  190 lb 4.1 oz (86.3 kg)    Height:        Intake/Output Summary (Last 24 hours) at 04/13/2017 1004 Last data filed at 04/13/2017 0949 Gross per 24 hour  Intake 840 ml  Output 1101 ml  Net -261 ml   Filed Weights   04/11/17 1721 04/12/17 1400 04/13/17 0528  Weight: 196 lb (88.9 kg) 190 lb (86.2 kg) 190 lb 4.1 oz (86.3 kg)    Telemetry    SB with occ PACs HR 40s (45+), intermittent junctional bradycardia  Same rate - Personally Reviewed   Physical Exam   GEN: No acute distress.  HEENT: Normocephalic, atraumatic, sclera non-icteric. Neck: No JVD or bruits. Cardiac: RRR soft SEM LSB no rubs rubs or gallops.  Radials/DP/PT 1+ and  equal bilaterally.  Respiratory: Coarse BS with scattered rare wheezing bilaterally. Breathing is unlabored. GI: Soft, nontender, non-distended, BS +x 4. MS: no deformity. Extremities: No clubbing or cyanosis. 2+ pitting BLE edema. Distal pedal pulses are 2+ and equal bilaterally. Neuro:  AAOx3. Follows commands. Psych:  Responds to questions appropriately with a normal affect.  Labs    Chemistry Recent Labs  Lab 04/11/17 1744 04/11/17 2050 04/11/17 2351 04/13/17 0438  NA 141  --   --  139  K 3.9  --   --  3.9  CL 109  --   --  108  CO2 23  --   --  26  GLUCOSE 82  --   --  78  BUN 30*  --   --  32*  CREATININE 1.90*  --  1.93* 1.70*  CALCIUM 8.5*  --   --  8.3*  PROT  --  7.4  --   --   ALBUMIN  --  2.9*  --   --   AST  --  39  --   --   ALT  --  22  --   --   ALKPHOS  --  91  --   --   BILITOT  --  1.4*  --   --  GFRNONAA 33*  --  33* 38*  GFRAA 38*  --  38* 44*  ANIONGAP 9  --   --  5     Hematology Recent Labs  Lab 04/11/17 1744 04/11/17 2351  WBC 7.8 7.2  RBC 3.32* 3.24*  HGB 11.7* 11.5*  HCT 35.3* 34.8*  MCV 106.3* 107.4*  MCH 35.2* 35.5*  MCHC 33.1 33.0  RDW 14.1 14.4  PLT 133* 125*    Cardiac EnzymesNo results for input(s): TROPONINI in the last 168 hours.  Recent Labs  Lab 04/11/17 1749  TROPIPOC 0.02     BNP Recent Labs  Lab 04/11/17 1744  BNP 1,699.9*     DDimer No results for input(s): DDIMER in the last 168 hours.   Radiology    Dg Chest 2 View  Result Date: 04/11/2017 CLINICAL DATA:  Left groin swelling.  Recent inguinal hernia repair. EXAM: CHEST  2 VIEW COMPARISON:  01/23/2017 and 07/01/2015 FINDINGS: Chronic lung changes and scarring in the right lung. Old fracture involving the right seventh rib. Persistent patchy densities in the right hilum and medial right lower lung. Left upper lung is clear. Heart and mediastinum are stable. IMPRESSION: Chronic changes throughout the right hemithorax. There has been minimal change since  the previous examination. Hazy densities in the right hilum and right lower lung region could represent mild edema or atelectasis. Electronically Signed   By: Markus Daft M.D.   On: 04/11/2017 18:10    Cardiac Studies   2D Echo 04/12/17 Study Conclusions  - Left ventricle: The cavity size was normal. Systolic function was   normal. The estimated ejection fraction was in the range of 60%   to 65%. Wall motion was normal; there were no regional wall   motion abnormalities. Severe bradycardia precludes evaluation of   LV diastolic function. - Ventricular septum: The contour showed diastolic flattening and   systolic flattening. These changes are consistent with RV volume   and pressure overload. - Aortic valve: There was trivial regurgitation. - Mitral valve: There was mild regurgitation directed centrally. - Left atrium: The atrium was mildly dilated. - Right ventricle: The cavity size was severely dilated. Wall   thickness was normal. - Right atrium: The atrium was severely dilated. - Atrial septum: No defect or patent foramen ovale was identified. - Tricuspid valve: There was moderate-severe regurgitation. There   was evidence of perivalvular regurgitation. - Pulmonary arteries: Systolic pressure was moderately to severely   increased. PA peak pressure: 67 mm Hg (S).   Patient Profile     76 y.o. male with typical atrial flutter with subsequent history of junctional rhythm (not anticoag candidate 2/2 liver disease), cirrhosis s/p TIPS (reduction in need for paracentesis following this), portal HTN, parenchymal insufficiency with spontaneously elevated prothrombin time, CKD III, EtOH abuse, gout, HTN, CKD III, history of spontaneous bacterial peritonitis, asthma whom we were consulted for bradycardia. Mr. Bun used to have problems with atrial flutter with RVR that was very difficult to rate control, converted to NSR on amiodarone, which was then dc'd for junctional rhythm. Has  subsequently had recurrence of atrial flutter and started on bisoprolol. He was admitted with 40lb of weight gain/fluid overload and HR noted to be low. Other issues this admission include macrocytic anemia with BRBPR (IM suspects internal hemorrhoids) and painless hematuria.   Assessment & Plan    1. Bradycardia  - beta blocker discontinued due to this. HR remains in the 40s predominantly, but asymptomatic; would hold off on resumption  at this time. TSH wnl. Would be a poor candidate for PPM implantation if needed given comorbidities so would manage conservatively for now.  2. Volume overload felt predominantly due to cirrhosis, also probable component of subsequent right heart failure with moderate-severe TR and mod-severely elevated pulm pressure - continue diuresis. Weight without much change from yesterday and I/O's less impressive. Will discuss ? dose of metolazone with Dr. Sallyanne Kuster. Also started on spironoloactone by internal medicine due to liver disease - will need to exercise caution with monitoring daily labs due to risk of hyperkalemia with renal dysfunction. Will await MD input regarding echo findings.  3. History of typical atrial flutter - maintaining sinus bradycardia/junctional bradycardia. Even if recurrence noted, would not be candidate for anticoagulation.  4. AKI on CKD stage III - Cr improving with diuresis. Monitor.  5. Bleeding as above - per IM.  For questions or updates, please contact [place] Please consult [email or web address] for contact info under Cardiology/STEMI.  Signed, Charlie Pitter, PA-C 04/13/2017, 10:04 AM    I have seen and examined the patient along with Charlie Pitter, PA-C.  I have reviewed the chart, notes and new data.  I agree with PA's note.  Key new complaints: no CV complaints Key examination changes: still markedly volume overloaded. Remains bradycardic so no beta blocker yet. Key new findings / data: echo shows findings consistent with portal  hypotension-associated pulmonary hypertension and secondary right heart changes; normal LV function.  PLAN: Start low dose beta blocker when heart rate is consistently >50. Agree with spironolactone, may also need metolazone. Caution with kidney function - at risk for hepatorenal sd.  Sanda Klein, MD, [place] [phone] 04/13/2017, 12:01 PM

## 2017-04-13 NOTE — Consult Note (Signed)
   [facility] Inpatient Consult   04/13/2017  Donald Moreno [birth year] [id number]  Patient evaluated for community based chronic disease management services with [place] Management Program as a benefit of patient's [employer] [place]. Spoke with patient on 04/12/17 at bedside to explain [place] Management services. Consent form obtained.  Patient states he has a niece and nephew who oversees his care.  Consent form signed and copy of [place] Management information folder given.   Patient will receive post hospital discharge call and will be evaluated for monthly home visits for assessments and disease process education.  Left contact information and [facility] literature at bedside. Made Inpatient Case Manager aware today, as patient was transferring to 3rd floor,  that [place] Management following. Of note, [facility] Care Management services does not replace or interfere with any services that are arranged by inpatient case management or social work.  For additional questions or referrals please contact:     Natividad Brood, RN BSN [facility] Liaison  [phone] business mobile phone Toll free office [phone]

## 2017-04-13 NOTE — Progress Notes (Signed)
Family Medicine Teaching Service Daily Progress Note Intern Pager: [phone]  [name] Medical record number: [id number] Date of birth: 12-14-1940 Age: 76 y.o. Gender: male  Primary Care Provider: Shirley, [place], DO Consultants: Cardiology Code Status: DNR (intubate only in surgery)  Pt Overview and Major Events to Date:  Bishoy Cupp is a 76 y.o. male presenting from Cardiology office with fluid overload. PMH is significant for liver cirrhosis, previous aflutter with RVR, CKD3, EtOH abuse (quit 3-4 years ago), asthma, s/p incarcerated umbilical hernia repair 10/2945 and elective bilateral inguinal hernia repair 03/30/2017.   Assessment and Plan:  #Volume overload: CHF vs. Cirrhosis/Portal hypertension s/p TIPS. MELD Score 17. Patient is being diuresised with IV lasix per cardiology. Patient is not on spironolactone or eplerinone. He is unsure if he has ever been on one of these drugs, but is willing to try. Will consider start low dose spironilactone and monitor potassium and Cr. Echo:EF: 60-65%, Tricusped Valve: moderate-severe regurge, elevated pulmonary pressures to 67, study limited by bradycardia. Wt 196lbs on admit on 11/27 > 190lbs on 11/29. UOP 750 mL. TSH wnl.  - IV lasix 80 mg BID, per cards - start 12.5 mg spironolactone, consider transition to eplerinone in outpatient if he can afford it.  - Strict I/Os - Daily weights - will need scale ordered at d/c - TSH - daily BMP - elevate scrotom  #Bradycardia: HRs still in 40s. Per cardiology, restart bisoprolol if HR > 50s. Contact cardioloyg overnight if HRs persistently in 30s. - monitor on tele - Hold bisoprolol until HR improves  #Asthma: On advair BID, albuterol prn, singulair 10 mg QHS. No evidence of acute exerbation. Satting well on RA.  [name] while hospitalized - continue singulair - albuterol prn   #AKI: SCr stable on dieresis.   - Continue to monitor  #Protein malnutrition: In setting of  chronic liver disease.  -Continue home ensure shakes BID.  - Nutrition consult  #Macrocytic anemia: PCP suspect internal hemorrhoids, as patient notes BRB on paper and normal colonoscopy in 2016. Hgb stable at baseline (BL ~11-12). B12 is normal. - continue home ferrous sulfate 325 mg BID - B12/Folate   #Painless hematuria. Pt continues to have painless hematuria and elevated PSA. Scrotum is edematous and foreskin are edematous, but most likely from fluid overload. Pt reports having an outpatient urology. UOP appointment on 12/07. PCP is concerned would like an inpatient urology consult. Will discuss with the team at rounds.   FEN/GI: HHD PPx: Heparin   Disposition: inpatient pending dieuresis   Subjective:  Patient denies any CP, abdominal pain. There was a blood stain on the bed from patient hematuria. Endorses some mild "dribbling". Denies any dysuria or genital pain.  Objective: Temp:  [98.1 F (36.7 C)-98.9 F (37.2 C)] 98.5 F (36.9 C) (11/29 0528) Pulse Rate:  [42-46] 42 (11/29 0528) Resp:  [18-19] 19 (11/29 0528) BP: (119-132)/(48-72) 132/48 (11/29 0528) SpO2:  [96 %-100 %] 96 % (11/29 0528) Weight:  [190 lb (86.2 kg)-190 lb 4.1 oz (86.3 kg)] 190 lb 4.1 oz (86.3 kg) (11/29 0528) Physical Exam: General: NAD, resting in bed Cardiovascular: RRR, no mrg Respiratory: Brochovesicular breath sounds, NWOB Abdomen: mildly distended, nontender, no peritoneal fluid wave present GU: edematous foreskin, but retractable, edematous scrotom, both nontender.  Extremities: 3+ pitting edema bilaterally, moves extremities spontaneously  Laboratory: Recent Labs  Lab 04/11/17 1744 04/11/17 2351  WBC 7.8 7.2  HGB 11.7* 11.5*  HCT 35.3* 34.8*  PLT 133* 125*   Recent  Labs  Lab 04/11/17 1744 04/11/17 2050 04/11/17 2351  NA 141  --   --   K 3.9  --   --   CL 109  --   --   CO2 23  --   --   BUN 30*  --   --   CREATININE 1.90*  --  1.93*  CALCIUM 8.5*  --   --   PROT  --  7.4   --   BILITOT  --  1.4*  --   ALKPHOS  --  91  --   ALT  --  22  --   AST  --  39  --   GLUCOSE 82  --   --     Bonnita Hollow, MD 04/13/2017, 6:51 AM PGY-1, [place] Intern pager: [phone], text pages welcome

## 2017-04-13 NOTE — Progress Notes (Signed)
Initial Nutrition Assessment  DOCUMENTATION CODES:   Severe malnutrition in context of chronic illness  INTERVENTION:   -Continue MVI daily -Continue Ensure Enlive po BID, each supplement provides 350 kcal and 20 grams of protein  NUTRITION DIAGNOSIS:   Severe Malnutrition related to chronic illness(cirrhosis) as evidenced by severe fat depletion, severe muscle depletion, edema.  GOAL:   Patient will meet greater than or equal to 90% of their needs  MONITOR:   PO intake, Supplement acceptance, Labs, Weight trends, Skin, I & O's  REASON FOR ASSESSMENT:   Consult Assessment of nutrition requirement/status  ASSESSMENT:   Donald Moreno is a 76 y.o. male presenting from Cardiology office with fluid overload. PMH is significant for liver cirrhosis, previous aflutter with RVR, CKD3, EtOH abuse (quit 3-4 years ago), asthma, s/p incarcerated umbilical hernia repair 05/173 and elective bilateral inguinal hernia repair 03/30/2017.   Pt admitted with anasarca and cirrhosis.   Spoke with pt at bedside, who reports "my appetite is always good". He shares he consumed 100% of breakfast, which consisted of coffee, french toast, bacon, and eggs. Typical meal intake PTA is 2 meals per day- pt and family members assist with meal preparation (meal consist of steak and onions, mashed potatoes, and collard greens). Recently, pt has been finishing up his Thanksgiving leftovers of chitlins, sweet potatoes, green beans, collard greens, and cornbread. He reports his family members monitor his fluid intake, but he otherwise eats what he wants. Foods are typically of softer texture due to lack of teeth.   Pt reports UBW of 170#. He endorses wt gain related to fluid retention. He does not weigh regularly, but "I know I have fluid when I can't button my pants".   Pt has been drinking 1-2 Ensure supplements daily for approximately 1 year per the advice of his PCP. He enjoys these supplements and has always  drank one this morning. Discussed with pt importance of good protein intake to preserve lean body mass.  Labs reviewed.   NUTRITION - FOCUSED PHYSICAL EXAM:    Most Recent Value  Orbital Region  Severe depletion  Upper Arm Region  Severe depletion  Thoracic and Lumbar Region  No depletion  Buccal Region  Moderate depletion  Temple Region  Severe depletion  Clavicle Bone Region  Mild depletion  Clavicle and Acromion Bone Region  Mild depletion  Scapular Bone Region  Mild depletion  Dorsal Hand  Severe depletion  Patellar Region  No depletion  Anterior Thigh Region  No depletion  Posterior Calf Region  No depletion  Edema (RD Assessment)  Moderate  Hair  Reviewed  Eyes  Reviewed  Mouth  Reviewed  Skin  Reviewed  Nails  Reviewed       Diet Order:  Diet Heart Room service appropriate? Yes; Fluid consistency: Thin; Fluid restriction: 1200 mL Fluid  EDUCATION NEEDS:   Education needs have been addressed  Skin:  Skin Assessment: Skin Integrity Issues: Skin Integrity Issues:: Incisions Incisions: closed lt groin  Last BM:  04/12/17  Height:   Ht Readings from Last 1 Encounters:  04/12/17 5\' 10"  (1.778 m)    Weight:   Wt Readings from Last 1 Encounters:  04/13/17 190 lb 4.1 oz (86.3 kg)    Ideal Body Weight:  75.5 kg  BMI:  Body mass index is 27.3 kg/m.  Estimated Nutritional Needs:   Kcal:  2200-2400  Protein:  115-130 grams  Fluid:  2.2-2.4 L    Duwan Adrian A. Jimmye Norman, RD, LDN, CDE Pager: [phone]  After hours Pager: [phone]

## 2017-04-13 NOTE — Progress Notes (Signed)
Patient's bilateral lower extremities wrapped with ACE wrap per order.

## 2017-04-14 DIAGNOSIS — N179 Acute kidney failure, unspecified: Secondary | ICD-10-CM

## 2017-04-14 DIAGNOSIS — N183 Chronic kidney disease, stage 3 (moderate): Secondary | ICD-10-CM

## 2017-04-14 LAB — BASIC METABOLIC PANEL
ANION GAP: 8 (ref 5–15)
BUN: 35 mg/dL — ABNORMAL HIGH (ref 6–20)
CHLORIDE: 103 mmol/L (ref 101–111)
CO2: 24 mmol/L (ref 22–32)
Calcium: 8.5 mg/dL — ABNORMAL LOW (ref 8.9–10.3)
Creatinine, Ser: 1.81 mg/dL — ABNORMAL HIGH (ref 0.61–1.24)
GFR calc non Af Amer: 35 mL/min — ABNORMAL LOW (ref >60)
GFR, EST AFRICAN AMERICAN: 41 mL/min — AB (ref >60)
Glucose, Bld: 83 mg/dL (ref 65–99)
POTASSIUM: 3.9 mmol/L (ref 3.5–5.1)
SODIUM: 135 mmol/L (ref 135–145)

## 2017-04-14 LAB — FOLATE RBC
Folate, Hemolysate: 307.4 ng/mL
Folate, RBC: 964 ng/mL (ref >498)
Hematocrit: 31.9 % — ABNORMAL LOW (ref 37.5–51.0)

## 2017-04-14 LAB — FOLATE: FOLATE: 10.4 ng/mL (ref >5.9)

## 2017-04-14 MED ORDER — BISOPROLOL FUMARATE 5 MG PO TABS
2.5000 mg | ORAL_TABLET | Freq: Every day | ORAL | Status: DC
  Administered 2017-04-14 – 2017-04-15 (×2): 2.5 mg via ORAL
  Filled 2017-04-14 (×3): qty 1

## 2017-04-14 NOTE — Progress Notes (Signed)
Progress Note  [name] Date of Encounter: 04/14/2017  Primary Cardiologist: Dr Sallyanne Kuster  Subjective   Less edema  Inpatient Medications    Scheduled Meds: . allopurinol  300 mg Oral Daily  . feeding supplement (ENSURE ENLIVE)  237 mL Oral BID BM  . ferrous sulfate  325 mg Oral BID WC  . furosemide  80 mg Intravenous BID  . heparin subcutaneous  5,000 Units Subcutaneous Q8H  . Influenza vac split quadrivalent PF  0.5 mL Intramuscular Tomorrow-1000  . mometasone-formoterol  2 puff Inhalation BID  . montelukast  10 mg Oral QHS  . multivitamin with minerals  1 tablet Oral Daily  . sodium chloride flush  3 mL Intravenous Q12H  . spironolactone  12.5 mg Oral Daily   Continuous Infusions:  PRN Meds: acetaminophen **OR** acetaminophen, albuterol, lactulose, sodium chloride flush   Vital Signs    Vitals:   04/13/17 2116 04/14/17 0035 04/14/17 0505 04/14/17 0801  BP: (!) 123/51 120/60 (!) 110/47   Pulse: 83 (!) 43 (!) 43   Resp: 18 18 18    Temp: 98.7 F (37.1 C) 98.5 F (36.9 C) 98.2 F (36.8 C)   TempSrc: Oral Oral Oral   SpO2: 100% 98% 98% 98%  Weight:   188 lb 11.2 oz (85.6 kg)   Height:        Intake/Output Summary (Last 24 hours) at 04/14/2017 1007 Last data filed at 04/14/2017 0524 Gross per 24 hour  Intake 240 ml  Output 2050 ml  Net -1810 ml   Filed Weights   04/12/17 1400 04/13/17 0528 04/14/17 0505  Weight: 190 lb (86.2 kg) 190 lb 4.1 oz (86.3 kg) 188 lb 11.2 oz (85.6 kg)    Telemetry    NSR, SB - Personally Reviewed  ECG    NSR, SB 42 on 04/12/17- Personally Reviewed  Physical Exam   GEN: No acute distress.   Neck: JVD noted Cardiac: RRR 2/6 systolic murmur Respiratory: Clear to auscultation bilaterally. GI: Soft, nontender, non-distended  MS: Trace No deformity. Neuro:  Nonfocal  Psych: Normal affect   Labs    Chemistry Recent Labs  Lab 04/11/17 1744 04/11/17 2050 04/11/17 2351 04/13/17 0438  04/14/17 0509  NA 141  --   --  139 135  K 3.9  --   --  3.9 3.9  CL 109  --   --  108 103  CO2 23  --   --  26 24  GLUCOSE 82  --   --  78 83  BUN 30*  --   --  32* 35*  CREATININE 1.90*  --  1.93* 1.70* 1.81*  CALCIUM 8.5*  --   --  8.3* 8.5*  PROT  --  7.4  --   --   --   ALBUMIN  --  2.9*  --   --   --   AST  --  39  --   --   --   ALT  --  22  --   --   --   ALKPHOS  --  91  --   --   --   BILITOT  --  1.4*  --   --   --   GFRNONAA 33*  --  33* 38* 35*  GFRAA 38*  --  38* 44* 41*  ANIONGAP 9  --   --  5 8     Hematology Recent Labs  Lab 04/11/17 1744 04/11/17 2351  WBC 7.8 7.2  RBC 3.32* 3.24*  HGB 11.7* 11.5*  HCT 35.3* 34.8*  MCV 106.3* 107.4*  MCH 35.2* 35.5*  MCHC 33.1 33.0  RDW 14.1 14.4  PLT 133* 125*    Cardiac EnzymesNo results for input(s): TROPONINI in the last 168 hours.  Recent Labs  Lab 04/11/17 1749  TROPIPOC 0.02     BNP Recent Labs  Lab 04/11/17 1744  BNP 1,699.9*     DDimer No results for input(s): DDIMER in the last 168 hours.   Radiology    CXR 04/11/17- IMPRESSION: Chronic changes throughout the right hemithorax. There has been minimal change since the previous examination. Hazy densities in the right hilum and right lower lung region could represent mild edema or atelectasis.   Cardiac Studies   Echo 04/12/17- Study Conclusions  - Left ventricle: The cavity size was normal. Systolic function was   normal. The estimated ejection fraction was in the range of 60%   to 65%. Wall motion was normal; there were no regional wall   motion abnormalities. Severe bradycardia precludes evaluation of   LV diastolic function. - Ventricular septum: The contour showed diastolic flattening and   systolic flattening. These changes are consistent with RV volume   and pressure overload. - Aortic valve: There was trivial regurgitation. - Mitral valve: There was mild regurgitation directed centrally. - Left atrium: The atrium was  mildly dilated. - Right ventricle: The cavity size was severely dilated. Wall   thickness was normal. - Right atrium: The atrium was severely dilated. - Atrial septum: No defect or patent foramen ovale was identified. - Tricuspid valve: There was moderate-severe regurgitation. There   was evidence of perivalvular regurgitation. - Pulmonary arteries: Systolic pressure was moderately to severely   increased. PA peak pressure: 67 mm Hg (S).   Patient Profile     76 y.o.AA malewith PMH of advanced liver cirrhosis and a h/o prior EtOH abuse(quit for 3-4 years) with portal HTN and ascites, atrial flutter with RVR in the past (not anticoagulated secondary to advanced liver disease), and CKD stage 3,.He was treated with Amiodarone for rate control and developed a junctional bradycardia, therefore this was discontinued and he was placed on Bisoprolol.   He was recently admitted for bilateral inguinal hernia repair 03/30/17. 4L of ascites was taken off. The pt had a bump in his SCr post op to 2.1. He was seen in the office 04/11/17 and was noted to have anasarca. He had massive scrotal and penile edema and 40 lb wgt gain over there previous 3 months. He was admitted for further evaluation.    Assessment & Plan    Bradycardia Beta blocker decreased  Anasarca Pt has scrotal and penile edema, 40 lb wgt gain in last 3 months I/O negative 2.7L, wgt down 8lbs  Inguinal hernia S/P inguinal hernia repair with 4L as ascites removed 37/16/96  Alcoholic cirrhosis of liver with ascites ([place]) TIPS procedure on 10/11/16.   Acute-on-chronic kidney injury ([place]) SCr 2.14 at discharge 03/31/17, 1.81 today  CKD (chronic kidney disease) stage 3, GFR 30-59 ml/min Baseline SCr 1.4-(?)  Typical atrial flutter ([facility]) NSR today- HR 45  Plan; Aldactone added. Continue IV Lasix and follow renal.   For questions or updates, please contact [place] Please consult [email or web address] for contact info  under Cardiology/STEMI.      Signed, Kerin Ransom, PA-C  04/14/2017, 10:07 AM    I have seen and examined the patient along with Kerin Ransom, PA-C .  I  have reviewed the chart, notes and new data.  I agree with PA's note.  Key new complaints: no symptoms of bradycardia, markedly improved  Key examination changes: heart rate still in 40s at rest Key new findings / data: improved creatinine from admission, slightly up from yesterday  PLAN: Still premature to start beta blocker. Bisoprolol half life is roughly doubled in cirrhosis and tripled in renal insufficiency. Would still recommend using a lower bisoprolol dose (2.5 mg( when his heart rate is consistently >50.  Thankfully his bradycardia is asymptomatic.   Sanda Klein, MD, [place] [phone] 04/14/2017, 10:29 AM

## 2017-04-14 NOTE — Progress Notes (Signed)
Patient has evidence of moderate protein calorie malnutrition with low albumen, loss of lean body weight (has + fluid weight) and temporalis wasting.  Likely acute on chronic.

## 2017-04-14 NOTE — Addendum Note (Signed)
Addended by: Zebedee Iba on: 04/14/2017 04:31 PM   Modules accepted: Orders

## 2017-04-14 NOTE — Care Management Note (Signed)
Case Management Note  Patient Details  Name: Donald Moreno MRN: [id number] Date of Birth: 09-11-1940  Subjective/Objective:  CHF                Action/Plan: Patient lives at home with his family ( adult nieces and nephew);Primary Care Provider: Shirley, [place], DO; has private insurance with [employer] with prescription drug coverage; he use mail order pharmacy and [facility]; No DME needed, he has an IT trainer wheelchair, standard wheelchair, 5 walkers, cane; his scales are broken and is planning on purchasing a new one. [facility] made aware and may be able to get him a new one; He states that he eats healthy with low sodium; Dietary consult placed for reinforcement for wise food choices. CM will continue to follow for needs.  Expected Discharge Date:  Possibly 04/17/2017               Expected Discharge Plan:  [place]  In-House Referral:   [facility]  Discharge planning Services  CM Consult  HH Arranged:  RN, Disease Management, PT [place] Agency:  [place]  Status of Service:  In process, will continue to follow  Sherrilyn Rist [phone] 04/14/2017, 11:28 AM

## 2017-04-14 NOTE — Progress Notes (Signed)
Family Medicine Teaching Service Daily Progress Note Intern Pager: [phone]  [name] Medical record number: [id number] Date of birth: [birth year] Age: 76 y.o. Gender: male  Primary Care Provider: Shirley, [place], DO Consultants: Cardiology Code Status: DNR (intubate only in surgery)  Pt Overview and Major Events to Date:  Lenwood Balsam is a 76 y.o. male presenting from Cardiology office with fluid overload. PMH is significant for liver cirrhosis, previous aflutter with RVR, CKD3, EtOH abuse (quit 3-4 years ago), asthma, s/p incarcerated umbilical hernia repair 0/8657 and elective bilateral inguinal hernia repair 03/30/2017.   Assessment and Plan:  #Volume overload: CHF vs. Cirrhosis/Portal hypertension s/p TIPS. MELD Score 17. Patient is being diuresised with IV lasix per cardiology. Patient was also started on low dose spironolactone. UOP seems very responsive to addition of spironolactone. UOP was 2.6 L. Potassium stable. Cr did mildy increase to 1.7 > 1.81. Will cont to monitor. BP was stable but low in AM at 110/47. Will continue to monitor. Wt 196 lbs. >> 188 lbs. Cont diuresis - IV lasix 80 mg BID, per cards - 12.5 mg spironolactone - Strict I/Os - Daily weights - will need scale ordered at d/c - daily BMP - elevate scrotom - legs wrapped in Ace wrap  #Bradycardia: HRs persistently in 40s. Per cardiology, restart bisoprolol if HR > 50s. Contact cardioloyg overnight if HRs persistently in 30s. - monitor on tele - Hold bisoprolol until HR improves  #AKI: Mild increase in Cr did mildy increase to 1.7 > 1.81. This is from diuresis. - Continue to monitor  #Macrocytic anemia: PCP suspect internal hemorrhoids, as patient notes BRB on paper and normal colonoscopy in 2016. Hgb stable at baseline (BL ~11-12). B12 is normal.Folate pending - continue home ferrous sulfate 325 mg BID  #Painless hematuria. Pt continues to have painless hematuria and elevated PSA.  Recommend patient follow up in outpatient.   FEN/GI: HHD, Fluid restriction  PPx: Heparin   Disposition: inpatient pending dieuresis   Subjective:  Patient feels edema had gone down some. Feels that he is urinating a lot.No pain or difficulty breathing.   Objective: Temp:  [98.2 F (36.8 C)-98.7 F (37.1 C)] 98.2 F (36.8 C) (11/30 0505) Pulse Rate:  [40-83] 43 (11/30 0505) Resp:  [18] 18 (11/30 0505) BP: (110-123)/(47-71) 110/47 (11/30 0505) SpO2:  [96 %-100 %] 98 % (11/30 0505) Weight:  [188 lb 11.2 oz (85.6 kg)] 188 lb 11.2 oz (85.6 kg) (11/30 0505) Physical Exam: General: NAD, resting in bed Cardiovascular: RRR, no mrg Respiratory: Brochovesicular breath sounds, NWOB Abdomen: mildly distended, nontender, no peritoneal fluid wave present Extremities: 3+ pitting edema bilaterally, moves extremities spontaneously  Laboratory: Recent Labs  Lab 04/11/17 1744 04/11/17 2351  WBC 7.8 7.2  HGB 11.7* 11.5*  HCT 35.3* 34.8*  PLT 133* 125*   Recent Labs  Lab 04/11/17 1744 04/11/17 2050 04/11/17 2351 04/13/17 0438  NA 141  --   --  139  K 3.9  --   --  3.9  CL 109  --   --  108  CO2 23  --   --  26  BUN 30*  --   --  32*  CREATININE 1.90*  --  1.93* 1.70*  CALCIUM 8.5*  --   --  8.3*  PROT  --  7.4  --   --   BILITOT  --  1.4*  --   --   ALKPHOS  --  91  --   --  ALT  --  22  --   --   AST  --  39  --   --   GLUCOSE 82  --   --  78    Bonnita Hollow, MD 04/14/2017, 6:54 AM PGY-1, [place] Intern pager: [phone], text pages welcome

## 2017-04-14 NOTE — Discharge Summary (Signed)
Donald Moreno  [name] Medical record number: [id number] Date of birth: 1940/07/07 Age: 76 y.o. Gender: male Date of Admission: 04/11/2017  Date of Discharge: 04/17/17 Admitting Physician: Zenia Resides, MD  Primary Care Provider: Shirley, [place], DO Consultants: Cardiolgy  Indication for Hospitalization: Anasarca  Discharge Diagnoses/Problem List:  Volume overload Bradycardia AKI Asthma Protein malnutrition Macrocytic anemia Painless hematuria  Disposition: to home   Discharge Condition: stable, improving  Discharge Exam:  See progress note from date of discharge   Brief Hospital Course:  Donald Moreno presented in anasarca with nearly 40 lbs of weight gain over the past few months. On admission he was found to be bradycardic in 40s with moderate ascites and 3+ pitting edema in his lower extremities. Labs were significant for mild hypoalbuminemia of 2.9, BNP of 1700, macrocytic anemia of 11.5 (at his baseline),.CXR showed no pulmonary fluid congestion and lungs were clear to oscultation. Echo showed EF: 60-65%. Volume overload was most likely due to cirrhosis vs. CHF. He was started on IV lasix for diuresis and oral spironolactone. Patient had significant urine output. Creatinine was stable. Weight on admission was 196 lbs and 185 lbs on discharge.   Issues for Follow Up:  1. Painless Hematuria - Patient has follow up appointment with urology on 04/21/17 2. Macrocytic anemia - Folate and B12 levels were wnl. Hg was stable. Consider further work up 3. Patient will need scale to check daily weights upon discharge 4. Monitor HR, patient was bradycardic during admission. Per cardiology should stay on beta blockers unless HR was in 30s.   Significant Procedures: none   Significant Labs and Imaging:  Recent Labs  Lab 04/11/17 1744 04/11/17 2351 04/13/17 0438  WBC 7.8 7.2  --   HGB 11.7* 11.5*  --   HCT 35.3*  34.8* 31.9*  PLT 133* 125*  --    Recent Labs  Lab 04/11/17 2050  04/13/17 0438 04/14/17 0509 04/15/17 0434 04/16/17 0422 04/17/17 0542  NA  --   --  139 135 136 136 134*  K  --   --  3.9 3.9 4.2 4.3 4.1  CL  --   --  108 103 103 101 102  CO2  --   --  26 24 27 26 25   GLUCOSE  --   --  78 83 98 91 88  BUN  --   --  32* 35* 36* 41* 41*  CREATININE  --    < > 1.70* 1.81* 1.86* 2.08* 1.84*  CALCIUM  --   --  8.3* 8.5* 8.9 8.9 8.7*  MG 2.2  --  2.2  --   --   --   --   PHOS  --   --  3.7  --   --   --   --   ALKPHOS 91  --   --   --   --   --   --   AST 39  --   --   --   --   --   --   ALT 22  --   --   --   --   --   --   ALBUMIN 2.9*  --   --   --   --   --   --    < > = values in this interval not displayed.   Transthoracic Echocardiogram - 04/12/2017  - Left ventricle: The cavity size was normal. Systolic  function was   normal. The estimated ejection fraction was in the range of 60%   to 65%. Wall motion was normal; there were no regional wall   motion abnormalities. Severe bradycardia precludes evaluation of   LV diastolic function. - Ventricular septum: The contour showed diastolic flattening and   systolic flattening. These changes are consistent with RV volume   and pressure overload. - Aortic valve: There was trivial regurgitation. - Mitral valve: There was mild regurgitation directed centrally. - Left atrium: The atrium was mildly dilated. - Right ventricle: The cavity size was severely dilated. Wall   thickness was normal. - Right atrium: The atrium was severely dilated. - Atrial septum: No defect or patent foramen ovale was identified. - Tricuspid valve: There was moderate-severe regurgitation. There   was evidence of perivalvular regurgitation. - Pulmonary arteries: Systolic pressure was moderately to severely   increased. PA peak pressure: 67 mm Hg (S).   Results/Tests Pending at Time of Discharge:  Unresulted Labs (From admission, onward)   None       Discharge Medications:  Allergies as of 04/17/2017   No Known Allergies     Medication List    STOP taking these medications   oxyCODONE 5 MG immediate release tablet Commonly known as:  Oxy IR/ROXICODONE     TAKE these medications   ADVAIR DISKUS 500-50 MCG/DOSE Aepb Generic drug:  Fluticasone-Salmeterol INHALE 1 PUFF TWO TIMES  DAILY   allopurinol 300 MG tablet Commonly known as:  ZYLOPRIM TAKE 1 TABLET BY MOUTH  DAILY   bisoprolol 5 MG tablet Commonly known as:  ZEBETA Take 0.5 tablets (2.5 mg total) by mouth daily. What changed:    medication strength  how much to take   ENSURE COMPLETE SHAKE Liqd Take 1 Container by mouth daily. What changed:  when to take this   ferrous sulfate 325 (65 FE) MG tablet Take 1 tablet (325 mg total) by mouth 2 (two) times daily with a meal.   furosemide 40 MG tablet Commonly known as:  LASIX Take 1.5 tablets (60 mg total) by mouth daily.   lactulose 10 GM/15ML solution Commonly known as:  CHRONULAC Take 15 mLs (10 g total) by mouth 2 (two) times daily as needed (need to ensure 2 soft BM in a day.).   montelukast 10 MG tablet Commonly known as:  SINGULAIR TAKE 1 TABLET BY MOUTH AT  BEDTIME What changed:    how much to take  how to take this  when to take this   multivitamin with minerals Tabs tablet Take 1 tablet by mouth daily.   PROAIR HFA 108 (90 Base) MCG/ACT inhaler Generic drug:  albuterol Use 2 puffs every 4 hours  as needed for wheezing or  shortness of breath   spironolactone 25 MG tablet Commonly known as:  ALDACTONE Take 0.5 tablets (12.5 mg total) by mouth daily.   Vitamin D-3 5000 units Tabs Take 5,000 Units daily by mouth.       Discharge Instructions: Please refer to Patient Instructions section of EMR for full details.  Patient was counseled important signs and symptoms that should prompt return to medical care, changes in medications, dietary instructions, activity restrictions, and follow  up appointments.   Follow-Up Appointments: Follow-up Information    Donald More, DO. Go on 04/20/2017.   Specialty:  Family Medicine Why:  Your appointment is scheduled for 3:15 pm. Please arrive 15 minutes early.  Contact information: [address] [place] [place] [address] [phone]  Donald More, DO 04/18/2017, 1:57 PM PGY-1, [place]

## 2017-04-14 NOTE — Care Management Important Message (Signed)
Important Message  Patient Details  Name: Donald Moreno MRN: [id number] Date of Birth: 1940-08-05   Medicare Important Message Given:  Yes    Sheily Lineman 04/14/2017, 12:06 PM

## 2017-04-14 NOTE — Plan of Care (Signed)
  Clinical Measurements: Ability to maintain clinical measurements within normal limits will improve 04/14/2017 0924 - Progressing by Imagene Gurney, RN

## 2017-04-14 NOTE — Progress Notes (Signed)
Nutrition Consult--Brief Note  Received consult for diet education regarding reinforcement of wise food choices. Nutritional evaluation, including diet education, was completed yesterday, 11/29. Provided patient with "Heart Failure Nutrition Therapy for the Undernourished" handout from the Academy of [employer]etics. He will give it to his niece to review as he does not read. He reports good intake of meals and supplements. No further nutrition needs at this time.   Molli Barrows, RD, LDN, [place] Pager [phone] After Hours Pager [phone]

## 2017-04-15 LAB — BASIC METABOLIC PANEL
ANION GAP: 6 (ref 5–15)
BUN: 36 mg/dL — ABNORMAL HIGH (ref 6–20)
CALCIUM: 8.9 mg/dL (ref 8.9–10.3)
CO2: 27 mmol/L (ref 22–32)
Chloride: 103 mmol/L (ref 101–111)
Creatinine, Ser: 1.86 mg/dL — ABNORMAL HIGH (ref 0.61–1.24)
GFR calc Af Amer: 39 mL/min — ABNORMAL LOW (ref >60)
GFR calc non Af Amer: 34 mL/min — ABNORMAL LOW (ref >60)
GLUCOSE: 98 mg/dL (ref 65–99)
Potassium: 4.2 mmol/L (ref 3.5–5.1)
Sodium: 136 mmol/L (ref 135–145)

## 2017-04-15 NOTE — Progress Notes (Signed)
Family Medicine Teaching Service Daily Progress Note Intern Pager: [phone]  [name] Medical record number: [id number] Date of birth: 1940/11/16 Age: 76 y.o. Gender: male  Primary Care Provider: Shirley, [place], DO Consultants: Cardiology Code Status: DNR (intubate only in surgery)  Pt Overview and Major Events to Date:  11/27- admitted to [place] with volume overload  Assessment and Plan: [name] is a 76 y.o. male presenting from Cardiology office with fluid overload. PMH is significant for liver cirrhosis, previous aflutter with RVR, CKD3, EtOH abuse (quit 3-4 years ago), asthma, s/p incarcerated umbilical hernia repair 08/1960 and elective bilateral inguinal hernia repair 03/30/2017.   #Volume overload: CHF vs. Cirrhosis/Portal hypertension s/p TIPS. MELD Score 17. Patient is being diuresised with IV lasix per cardiology. Net negative 3.7L since admission. 196 lb > 187 today. Anticipate he has some way to go with diuresis still. - continue IV lasix 80 mg BID - cardiology following, appreciate recommendations - continue 12.5 mg spironolactone, monitor K - Strict I/Os - Daily weights - will need scale ordered at d/c - daily BMP - elevate scrotom - wrap legs with Ace bilaterally for compression  #Bradycardia: improved, HR in 60's today. - monitor on tele - continue bisoprolol 2.5 mg daily  #Asthma: On advair BID, albuterol prn, singulair 10 mg QHS. No evidence of acute exerbation.  Donald Moreno while hospitalized - continue singulair - albuterol prn  #AKI: SCr stable with IV diuresis at 1.86 today, baseline unclear but appears to be around 1.1  - Continue to monitor BMP daily  #Protein malnutrition: In setting of chronic liver disease.  -Continue home ensure shakes BID.  - Nutrition consulted, appreciated recommendations  #Macrocytic anemia: PCP suspect internal hemorrhoids, as patient notes BRB on paper and normal colonoscopy in 2016. Hgb stable, at  baseline (BL ~11-12). B12 and folate normal. MVC 107. Likely due to liver disease. -continue home ferrous sulfate 325 mg BID  #Painless hematuria. Pt continues to have painless hematuria and elevated PSA. Pt reports having an outpatient urologist and appointment on 12/07.  - PCP is concerned would like an inpatient urology consult. Will discuss with the team at rounds.   FEN/GI: HHD PPx: Heparin   Disposition: continue inpatient care and IV diuresis  Subjective:  Reports he is feeling very, no complaints. Feels swelling is improved. Denies SOB.   Objective: Temp:  [97.6 F (36.4 C)-98 F (36.7 C)] 97.6 F (36.4 C) (12/01 0617) Pulse Rate:  [67-87] 67 (12/01 0617) Resp:  [16-17] 17 (12/01 0617) BP: (112-120)/(51-54) 112/51 (12/01 0617) SpO2:  [97 %-100 %] 97 % (12/01 0617) Weight:  [187 lb 6.4 oz (85 kg)] 187 lb 6.4 oz (85 kg) (12/01 0617) Physical Exam: General: pleasant gentleman, sitting up on side of bed eating breakfast Cardiovascular: RRR, no mrg Respiratory: CTAB, normal WOB Abdomen: mildly distended, nontender, no peritoneal fluid wave present GU: edematous scrotom Extremities: 3+ pitting edema bilaterally, moves extremities spontaneously Neuro: no focal deficits  Laboratory: Recent Labs  Lab 04/11/17 1744 04/11/17 2351 04/13/17 0438  WBC 7.8 7.2  --   HGB 11.7* 11.5*  --   HCT 35.3* 34.8* 31.9*  PLT 133* 125*  --    Recent Labs  Lab 04/11/17 2050  04/13/17 0438 04/14/17 0509 04/15/17 0434  NA  --   --  139 135 136  K  --   --  3.9 3.9 4.2  CL  --   --  108 103 103  CO2  --   --  26 24 27   BUN  --   --  32* 35* 36*  CREATININE  --    < > 1.70* 1.81* 1.86*  CALCIUM  --   --  8.3* 8.5* 8.9  PROT 7.4  --   --   --   --   BILITOT 1.4*  --   --   --   --   ALKPHOS 91  --   --   --   --   ALT 22  --   --   --   --   AST 39  --   --   --   --   GLUCOSE  --   --  78 83 98   < > = values in this interval not displayed.    Donald Rattler,  DO 04/15/2017, 8:56 AM PGY-2, [place] Intern pager: [phone], text pages welcome

## 2017-04-16 LAB — BASIC METABOLIC PANEL
ANION GAP: 9 (ref 5–15)
BUN: 41 mg/dL — ABNORMAL HIGH (ref 6–20)
CO2: 26 mmol/L (ref 22–32)
Calcium: 8.9 mg/dL (ref 8.9–10.3)
Chloride: 101 mmol/L (ref 101–111)
Creatinine, Ser: 2.08 mg/dL — ABNORMAL HIGH (ref 0.61–1.24)
GFR calc Af Amer: 34 mL/min — ABNORMAL LOW (ref >60)
GFR calc non Af Amer: 30 mL/min — ABNORMAL LOW (ref >60)
GLUCOSE: 91 mg/dL (ref 65–99)
POTASSIUM: 4.3 mmol/L (ref 3.5–5.1)
Sodium: 136 mmol/L (ref 135–145)

## 2017-04-16 MED ORDER — FUROSEMIDE 40 MG PO TABS
60.0000 mg | ORAL_TABLET | Freq: Every day | ORAL | Status: DC
  Administered 2017-04-16 – 2017-04-17 (×2): 60 mg via ORAL
  Filled 2017-04-16 (×2): qty 1

## 2017-04-16 NOTE — Progress Notes (Signed)
Family Medicine Teaching Service Daily Progress Note Intern Pager: [phone]  [name] Medical record number: [id number] Date of birth: Oct 02, 1940 Age: 76 y.o. Gender: male  Primary Care Provider: Shirley, [place], DO Consultants: Cardiology Code Status: DNR (intubate only in surgery)  Pt Overview and Major Events to Date:  11/27- admitted to [place] [facility] with volume overload  Assessment and Plan: Donald Moreno is a 76 y.o. male presenting from Cardiology office with fluid overload. PMH is significant for liver cirrhosis, previous aflutter with RVR, CKD3, EtOH abuse (quit 3-4 years ago), asthma, s/p incarcerated umbilical hernia repair 01/4708 and elective bilateral inguinal hernia repair 03/30/2017.   #Volume overload: CHF vs. Cirrhosis/Portal hypertension s/p TIPS. MELD Score 17. Net negative 5.1L since admission. 196 lb > 185 pounds today. - d/c IV lasix, restart on home lasix 60 mg PO daily - cardiology following, appreciate recommendations - continue 12.5 mg spironolactone, monitor K - Strict I/Os - Daily weights - will need scale ordered at d/c - daily BMP - elevate scrotom - wrap legs with Ace bilaterally for compression  #Bradycardia: stable. HR 45 today. Per cardiology continue Beta blocker unless HR in 30's. - monitor on tele - continue bisoprolol 2.5 mg daily  #AKI: SCr has increased to 2.08 with IV diuresis, baseline unclear but appears to be around 1.1. Will d/c IV diuretics and restart home lasix dose  - Continue to monitor BMP daily  #Asthma: On advair BID, albuterol prn, singulair 10 mg QHS. No evidence of acute exerbation.  [name] while hospitalized - continue singulair - albuterol prn  #Protein malnutrition: In setting of chronic liver disease.  -Continue home ensure shakes BID.  - Nutrition consulted, appreciated recommendations  #Macrocytic anemia: PCP suspect internal hemorrhoids, as patient notes BRB on paper and normal colonoscopy in  2016. Hgb stable, at baseline (BL ~11-12). B12 and folate normal. MVC 107. Likely due to liver disease. -continue home ferrous sulfate 325 mg BID  #Painless hematuria. Pt continues to have painless hematuria and elevated PSA. Pt reports having an outpatient urologist and appointment on 12/07.  - PCP is concerned would like an inpatient urology consult. Will discuss with the team at rounds.   FEN/GI: heart healthy diet PPx: Heparin   Disposition: anticipate discharge 12/3  Subjective:  Feels well, denies complaints. Slept well. He is in good spirits. Feels swelling has improved.   Objective: Temp:  [97.8 F (36.6 C)-98.5 F (36.9 C)] 98.5 F (36.9 C) (12/02 0531) Pulse Rate:  [43-51] 45 (12/02 0531) Resp:  [17-18] 18 (12/02 0531) BP: (119-132)/(51-59) 129/59 (12/02 0531) SpO2:  [95 %-100 %] 95 % (12/02 0531) Weight:  [185 lb 11.2 oz (84.2 kg)] 185 lb 11.2 oz (84.2 kg) (12/02 0531) Physical Exam: General: pleasant gentleman, laying in bed in NAD Cardiovascular: RRR, no mrg Respiratory: CTAB, normal WOB Abdomen: mildly distended, nontender, no peritoneal fluid wave present GU: edematous scrotom Extremities: ace wraps applied to bilateral LE up to calves, no edema noted above wraps Neuro: no focal deficits  Laboratory: Recent Labs  Lab 04/11/17 1744 04/11/17 2351 04/13/17 0438  WBC 7.8 7.2  --   HGB 11.7* 11.5*  --   HCT 35.3* 34.8* 31.9*  PLT 133* 125*  --    Recent Labs  Lab 04/11/17 2050  04/14/17 0509 04/15/17 0434 04/16/17 0422  NA  --    < > 135 136 136  K  --    < > 3.9 4.2 4.3  CL  --    < >  103 103 101  CO2  --    < > 24 27 26   BUN  --    < > 35* 36* 41*  CREATININE  --    < > 1.81* 1.86* 2.08*  CALCIUM  --    < > 8.5* 8.9 8.9  PROT 7.4  --   --   --   --   BILITOT 1.4*  --   --   --   --   ALKPHOS 91  --   --   --   --   ALT 22  --   --   --   --   AST 39  --   --   --   --   GLUCOSE  --    < > 83 98 91   < > = values in this interval not  displayed.    [name], DO 04/16/2017, 8:34 AM PGY-2, [place] Intern pager: [phone], text pages welcome

## 2017-04-17 DIAGNOSIS — E8770 Fluid overload, unspecified: Secondary | ICD-10-CM

## 2017-04-17 LAB — BASIC METABOLIC PANEL
ANION GAP: 7 (ref 5–15)
BUN: 41 mg/dL — ABNORMAL HIGH (ref 6–20)
CALCIUM: 8.7 mg/dL — AB (ref 8.9–10.3)
CO2: 25 mmol/L (ref 22–32)
Chloride: 102 mmol/L (ref 101–111)
Creatinine, Ser: 1.84 mg/dL — ABNORMAL HIGH (ref 0.61–1.24)
GFR, EST AFRICAN AMERICAN: 40 mL/min — AB (ref >60)
GFR, EST NON AFRICAN AMERICAN: 34 mL/min — AB (ref >60)
GLUCOSE: 88 mg/dL (ref 65–99)
POTASSIUM: 4.1 mmol/L (ref 3.5–5.1)
SODIUM: 134 mmol/L — AB (ref 135–145)

## 2017-04-17 MED ORDER — ADULT MULTIVITAMIN W/MINERALS CH: 1.0000 | ORAL_TABLET | Freq: Every day | ORAL | 0 refills | Status: DC

## 2017-04-17 MED ORDER — FUROSEMIDE 40 MG PO TABS: 60.0000 mg | ORAL_TABLET | Freq: Every day | ORAL | 0 refills | Status: DC

## 2017-04-17 MED ORDER — SPIRONOLACTONE 25 MG PO TABS: 12.5000 mg | ORAL_TABLET | Freq: Every day | ORAL | 0 refills | Status: DC

## 2017-04-17 MED ORDER — BISOPROLOL FUMARATE 5 MG PO TABS: 2.5000 mg | ORAL_TABLET | Freq: Every day | ORAL | 0 refills | Status: DC

## 2017-04-17 NOTE — Progress Notes (Signed)
Family Medicine Teaching Service Daily Progress Note Intern Pager: [phone]  [name] Medical record number: [id number] Date of birth: [birth year] Age: 76 y.o. Gender: male  Primary Care Provider: Shirley, [place], DO Consultants: Cardiology  Code Status: DNR (intubate only in surgery)  Pt Overview and Major Events to Date:  Admitted to [place] [facility] on 04/11/17   Assessment and Plan: [name] a 76 y.o.malepresenting from Cardiology office with fluid overload. PMH is significant forliver cirrhosis, previous aflutter with RVR, CKD3, EtOH abuse (quit 3-4 years ago),asthma,s/p incarcerated umbilical hernia repair 06/4266 and elective bilateral inguinal hernia repair 03/30/2017.   Volume overload:  CHF vs. Cirrhosis/Portal hypertension s/p TIPS. MELD Score 17. Net negative 6.3L since admission. 196 lb > 185 lbs pounds today. - continue home lasix 60 mg PO daily - cardiology following, appreciate recommendations - continue 12.5 mg spironolactone, monitor K. K of 4.1 today  - Strict I/Os - Daily weights - will need scale ordered at d/c - daily BMP - elevate scrotom - wrap legs with Ace bilaterally for compression  Bradycardia:  stable. HR 49 today. Per cardiology continue Beta blocker unless HR in 30's. - monitor on tele - continue bisoprolol 2.5 mg daily  AKI SCr has improved to 1.84 today from 2.08 on 04/16/17 with IV diuresis from 1.93 on admission, baseline unclear but appears to be around 1.1.   - Continue to monitor BMP daily   Asthma Home medications: advair BID, albuterol prn, singulair 10 mg QHS. No evidence of acute exerbation.  [name] while hospitalized - continue singulair - albuterol prn  Protein malnutrition In setting of chronic liver disease.  -Continue home ensure shakes BID.  - Nutrition consulted, appreciated recommendations  Macrocytic anemia Likely 2/2 liver disease. PCP suspect internal hemorrhoids, as patient notes BRB on  paper and normal colonoscopy in 2016. Hgb stable with 11.5 on 04/13/17, at baseline (BL ~11-12). B12 and folate normal. MVC 107.  -continue home ferrous sulfate 325 mg BID  Painless hematuria Pt continues to have painless hematuria and elevated PSA. Pt reports having an outpatient urologist and appointment on 12/07.  - PCP is concerned would like an inpatient urology consult. Will discuss with the team at rounds.   FEN/GI: heart healthy diet PPx: Heparin   Disposition: likely discharge on 04/17/17  Subjective:  Patient today "feeling better". States that scrotum edema is significantly improved since admission. Patient wants to go home.   Objective: Temp:  [98 F (36.7 C)-98.5 F (36.9 C)] 98 F (36.7 C) (12/03 3419) Pulse Rate:  [48-49] 48 (12/03 0638) Resp:  [16] 16 (12/03 0638) BP: (125)/(55-57) 125/57 (12/03 0638) SpO2:  [98 %-100 %] 99 % (12/03 0731) Weight:  [185 lb 11.2 oz (84.2 kg)] 185 lb 11.2 oz (84.2 kg) (12/03 6222) Physical Exam: General: awake and alert, NAD, sitting up in bed eating breakfast  Cardiovascular: RRR, no MRG, 2+ radial pulse (rate of 50)  Respiratory: CTAB, no wheezes rales or rhonchi  Abdomen: soft, non tender, bowel sounds x 4 quadrants  Extremities: 1+ pedal edema bilaterally, improvement since using ace wrapping, ace wrapping up to knees bilaterally, no edema above wraps   GU: scrotal edema (improved per patient interpretation)  Laboratory: Recent Labs  Lab 04/11/17 1744 04/11/17 2351 04/13/17 0438  WBC 7.8 7.2  --   HGB 11.7* 11.5*  --   HCT 35.3* 34.8* 31.9*  PLT 133* 125*  --    Recent Labs  Lab 04/11/17 2050  04/15/17 0434 04/16/17 0422 04/17/17  0542  NA  --    < > 136 136 134*  K  --    < > 4.2 4.3 4.1  CL  --    < > 103 101 102  CO2  --    < > 27 26 25   BUN  --    < > 36* 41* 41*  CREATININE  --    < > 1.86* 2.08* 1.84*  CALCIUM  --    < > 8.9 8.9 8.7*  PROT 7.4  --   --   --   --   BILITOT 1.4*  --   --   --   --    ALKPHOS 91  --   --   --   --   ALT 22  --   --   --   --   AST 39  --   --   --   --   GLUCOSE  --    < > 98 91 88   < > = values in this interval not displayed.     Imaging/Diagnostic Tests: Dg Chest 2 View  Result Date: 04/11/2017 CLINICAL DATA:  Left groin swelling.  Recent inguinal hernia repair. EXAM: CHEST  2 VIEW COMPARISON:  01/23/2017 and 07/01/2015 FINDINGS: Chronic lung changes and scarring in the right lung. Old fracture involving the right seventh rib. Persistent patchy densities in the right hilum and medial right lower lung. Left upper lung is clear. Heart and mediastinum are stable. IMPRESSION: Chronic changes throughout the right hemithorax. There has been minimal change since the previous examination. Hazy densities in the right hilum and right lower lung region could represent mild edema or atelectasis. Electronically Signed   By: Markus Daft M.D.   On: 04/11/2017 18:10    [name], DO 04/17/2017, 1:37 PM PGY-1, [place] Intern pager: [phone], text pages welcome

## 2017-04-17 NOTE — Discharge Instructions (Signed)
You were admitted for fluid overload. After medications in the hospital, this improved and you are being discharged on your home medications.   Please follow up appointment with urology on 04/21/17.

## 2017-04-18 ENCOUNTER — Other Ambulatory Visit: Payer: Self-pay | Admitting: *Deleted

## 2017-04-18 NOTE — Patient Outreach (Signed)
[facility]) Care Management  04/18/2017  Donald Moreno 06-23-1940 [id number]   Referral received from hospital liaison for transition of care as member recently admitted to hospital from 11/27 - 12/3 for volume overload/anasarca. Per chart, he also has history of hypertension, atrial flutter, congestive heart failure, chronic kidney disease, and alcoholic cirrhosis of the liver.    Call placed to member, identity confirmed.  This care manager introduced self and purpose of call.  [facility] care management services explained.  Spoke with member and caregiver/nephew, transition of care assessment complete.  Nephew confirms MD appointment for 12/6, report compliance with medications.  Patient was recently discharged from hospital and all medications have been reviewed.    Nephew hesitant to schedule home visit with this care manager stating member already has home health involved.  Difference between [facility] and home health explained, agrees to home visit next week.  He is aware of option to opt out of program at any time.  Will perform home assessment next week and discuss ongoing involvement at that time.   [facility] CM Care Plan Problem One     Most Recent Value  Care Plan Problem One  Risk for readmission related to hypervolemia as evidenced by recent hospitalization  Role Documenting the Problem One  Care Management Carlos for Problem One  Active  [facility] Long Term Goal   Member will not be readmitted to hospital within the 31 days of discharge  [facility] Long Term Goal Start Date  04/18/17  Interventions for Problem One Long Term Goal  Discussed with member the importance of following discharge instructions, including follow up appointments, medications, diet, and home health involvement, to decrease the risk of readmission  [facility] CM Short Term Goal #1   Member will keep and attend follow up appointment with primary MD within the next 2 weeks  [facility] CM Short Term Goal #1 Start Date   04/18/17  Interventions for Short Term Goal #1  Confirmed follow up appointment via schedule in chart, confirmed member will have transportation  [facility] CM Short Term Goal #2   Member will weigh and record readings daily over the next 4 weeks  [facility] CM Short Term Goal #2 Start Date  04/18/17  Interventions for Short Term Goal #2  Educated member/nephew on the importance of daily weights in effort to manage fluid status closely      Valente David, MSN, RN [place] Manager [phone]

## 2017-04-19 NOTE — Progress Notes (Signed)
Subjective:    Patient ID: Donald Moreno, male    DOB: 1940/12/12, 76 y.o.   MRN: [id number]   CC: hospital follow up   HPI: Admitted from 04/11/17-04/17/17  Edema Patient was admitted from 04/11/17-04/17/17 for increased edema. Patient was seen by both PCP and then by cardiologist who told patient to go to emergency room due to increased scrotal edema. Patient today states edema has significantly improved. Patient is compliant on daily lasix and spironolactone, and his niece (who is home health aide) helps him manage medications. Patient states he has not wrapped his legs as he does not have anyone to help do this, but he does elevate his legs daily. Patient does not elevate his scrotum daily as he forgets on occasion to do this. Patient is following a strict low salt diet.   Hematuria Denies further hematuria. States he has a follow up appointment with urology on 04/21/17 and is very compliant with keeping his office visits.   Macrocytic anemia Patient with history of macrocytic anemia. Folate and B12 levels were wnl and Hg was stable prior to discharge. Patient denies alcohol abuse.   Bradycardia  Patient denies symptoms of chest pain, dizziness, lightheadedness, syncope, shortness of breath, or fatigue. Patient was seen by cardiology while inpatient who advised that patient remain on beta blockers unless HR is in the 30s.   ROS: denies chest pain, SOB, fever, chills, n/v/d. Endorses urinary frequency   Objective:  BP (!) 148/76   Pulse (!) 42   Temp 98 F (36.7 C) (Oral)   Ht 5\' 10"  (1.778 m)   Wt 187 lb 9.6 oz (85.1 kg)   SpO2 98%   BMI 26.92 kg/m  Vitals and nursing note reviewed  General: well nourished, in no acute distress Cardiac: RRR, clear S1 and S2, no murmurs, rubs, or gallops Respiratory: slight expiratory wheezing throughout, patient states this is chronic Abdomen: soft, nontender, nondistended, no masses or organomegaly. Bowel sounds present Extremities: 2+  pitting edema to thighs bilaterally, scrotal edema but improved since admission Skin: warm and dry, no rashes noted Neuro: alert and oriented, no focal deficits  Assessment & Plan:    Volume overload Patient admitted for fluid overload. Patient was discharged with lasix 60mg  daily and spironolactone 12.5mg  daily, which he is compliant with. Patient reporting that he urinates very frequently. Patient continues to have 2+ pitting edema in lower extremities bilaterally and scrotal edema.  -increase lasix to 60mg  bid. -increase spironolactone to 25mg  daily  -follow up in 1 week for monitoring of edema and weight. Will need repeat BMP at follow up given increase in spironolactone dose  -have placed order for [facility] consultation to provide scale so patient can weight himself at home. Informed patient that if he notices significant weight gain he should go to emergency room -strict return precautions given   Hematuria Patient asymptomatic.  -follow up with urology on 04/21/17  Macrocytic anemia Unspecified cause. B12 and folate wnl. Denies alcohol use. Likely 2/2 liver disease given history of cirrhosis.  -no further workup needed at this moment  -follow up as needed  Bradycardia Bradycardia noted in hospital. Patient bradycardic today with HR of 42. Patient asymptomatic. Per cardiology in hospital, patient is to continue beta blockers unless heart rate is in 30s.  -continue bisoprolol  -continue to monitor -home health nurse visits patient to monitor HR, if patient's HR <30 would consider stopping beta blockers and further cardiology follow up   Return in about 1 week (around  04/27/2017) for BMP, weight check.   [name], DO, PGY-1

## 2017-04-20 ENCOUNTER — Encounter: Payer: Self-pay | Admitting: Family Medicine

## 2017-04-20 ENCOUNTER — Telehealth: Payer: Self-pay | Admitting: Cardiovascular Disease

## 2017-04-20 ENCOUNTER — Ambulatory Visit (INDEPENDENT_AMBULATORY_CARE_PROVIDER_SITE_OTHER): Payer: Medicare Other | Admitting: Family Medicine

## 2017-04-20 ENCOUNTER — Other Ambulatory Visit: Payer: Self-pay

## 2017-04-20 VITALS — BP 148/76 | HR 42 | Temp 98.0°F | Ht 70.0 in | Wt 187.6 lb

## 2017-04-20 DIAGNOSIS — Z7951 Long term (current) use of inhaled steroids: Secondary | ICD-10-CM | POA: Diagnosis not present

## 2017-04-20 DIAGNOSIS — D539 Nutritional anemia, unspecified: Secondary | ICD-10-CM | POA: Insufficient documentation

## 2017-04-20 DIAGNOSIS — E8770 Fluid overload, unspecified: Secondary | ICD-10-CM | POA: Diagnosis not present

## 2017-04-20 DIAGNOSIS — E46 Unspecified protein-calorie malnutrition: Secondary | ICD-10-CM | POA: Diagnosis not present

## 2017-04-20 DIAGNOSIS — R319 Hematuria, unspecified: Secondary | ICD-10-CM

## 2017-04-20 DIAGNOSIS — I509 Heart failure, unspecified: Secondary | ICD-10-CM

## 2017-04-20 DIAGNOSIS — R001 Bradycardia, unspecified: Secondary | ICD-10-CM

## 2017-04-20 DIAGNOSIS — J45909 Unspecified asthma, uncomplicated: Secondary | ICD-10-CM | POA: Diagnosis not present

## 2017-04-20 DIAGNOSIS — N183 Chronic kidney disease, stage 3 (moderate): Secondary | ICD-10-CM | POA: Diagnosis not present

## 2017-04-20 DIAGNOSIS — M109 Gout, unspecified: Secondary | ICD-10-CM | POA: Diagnosis not present

## 2017-04-20 DIAGNOSIS — D7589 Other specified diseases of blood and blood-forming organs: Secondary | ICD-10-CM | POA: Diagnosis not present

## 2017-04-20 DIAGNOSIS — K7460 Unspecified cirrhosis of liver: Secondary | ICD-10-CM | POA: Diagnosis not present

## 2017-04-20 DIAGNOSIS — K766 Portal hypertension: Secondary | ICD-10-CM | POA: Diagnosis not present

## 2017-04-20 MED ORDER — FUROSEMIDE 40 MG PO TABS: 60.0000 mg | ORAL_TABLET | Freq: Two times a day (BID) | ORAL | 0 refills | Status: DC

## 2017-04-20 MED ORDER — SPIRONOLACTONE 25 MG PO TABS: 25.0000 mg | ORAL_TABLET | Freq: Every day | ORAL | 0 refills | Status: DC

## 2017-04-20 NOTE — Patient Instructions (Signed)
Heart Failure Heart failure means your heart has trouble pumping blood. This makes it hard for your body to work well. Heart failure is usually a long-term (chronic) condition. You must take good care of yourself and follow your doctor's treatment plan. Follow these instructions at home:  Take your heart medicine as told by your doctor. ? Do not stop taking medicine unless your doctor tells you to. ? Do not skip any dose of medicine. ? Refill your medicines before they run out. ? Take other medicines only as told by your doctor or pharmacist.  Stay active if told by your doctor. The elderly and people with severe heart failure should talk with a doctor about physical activity.  Eat heart-healthy foods. Choose foods that are without trans fat and are low in saturated fat, cholesterol, and salt (sodium). This includes fresh or frozen fruits and vegetables, fish, lean meats, fat-free or low-fat dairy foods, whole grains, and high-fiber foods. Lentils and dried peas and beans (legumes) are also good choices.  Limit salt if told by your doctor.  Cook in a healthy way. Roast, grill, broil, bake, poach, steam, or stir-fry foods.  Limit fluids as told by your doctor.  Weigh yourself every morning. Do this after you pee (urinate) and before you eat breakfast. Write down your weight to give to your doctor.  Take your blood pressure and write it down if your doctor tells you to.  Ask your doctor how to check your pulse. Check your pulse as told.  Lose weight if told by your doctor.  Stop smoking or chewing tobacco. Do not use gum or patches that help you quit without your doctor's approval.  Schedule and go to doctor visits as told.  Nonpregnant women should have no Moreno than 1 drink a day. Men should have no Moreno than 2 drinks a day. Talk to your doctor about drinking alcohol.  Stop illegal drug use.  Stay current with shots (immunizations).  Manage your health conditions as told by your  doctor.  Learn to manage your stress.  Rest when you are tired.  If it is really hot outside: ? Avoid intense activities. ? Use air conditioning or fans, or get in a cooler place. ? Avoid caffeine and alcohol. ? Wear loose-fitting, lightweight, and light-colored clothing.  If it is really cold outside: ? Avoid intense activities. ? Layer your clothing. ? Wear mittens or gloves, a hat, and a scarf when going outside. ? Avoid alcohol.  Learn about heart failure and get support as needed.  Get help to maintain or improve your quality of life and your ability to care for yourself as needed. Contact a doctor if:  You gain weight quickly.  You are Moreno short of breath than usual.  You cannot do your normal activities.  You tire easily.  You cough Moreno than normal, especially with activity.  You have any or Moreno puffiness (swelling) in areas such as your hands, feet, ankles, or belly (abdomen).  You cannot sleep because it is hard to breathe.  You feel like your heart is beating fast (palpitations).  You get dizzy or light-headed when you stand up. Get help right away if:  You have trouble breathing.  There is a change in mental status, such as becoming less alert or not being able to focus.  You have chest pain or discomfort.  You faint. This information is not intended to replace advice given to you by your health care provider. Make sure you  discuss any questions you have with your health care provider. Document Released: 02/09/2008 Document Revised: 10/08/2015 Document Reviewed: 06/18/2012 Elsevier Interactive Patient Education  2017 [employer].   It was a pleasure seeing you today.   Today we discussed your hospital follow up  For your swelling : I have increased lasix to 60 mg twice a day. I have also increased spironolactone to 25 mg daily. I have asked home health to give you a scale. Please check your weight daily and if you notice an increase in weight  call the clinic or go to the emergency room.   Please follow up in 1 week with Dr. Brett Albino or sooner if symptoms persist or worsen. Please call the clinic immediately if you have concerns.   Our clinic's number is [phone]. Please call with questions or concerns.   Thank you,  Donald More, DO

## 2017-04-20 NOTE — Assessment & Plan Note (Addendum)
Bradycardia noted in hospital. Patient bradycardic today with HR of 42. Patient asymptomatic. Per cardiology in hospital, patient is to continue beta blockers unless heart rate is in 30s.  -continue bisoprolol  -continue to monitor -home health nurse visits patient to monitor HR, if patient's HR <30 would consider stopping beta blockers and further cardiology follow up

## 2017-04-20 NOTE — Telephone Encounter (Signed)
Donald Moreno is calling to report a low heart rate of 44 and she needed verbal orders to continue to see him for heart failure . Thanks

## 2017-04-20 NOTE — Assessment & Plan Note (Signed)
Unspecified cause. B12 and folate wnl. Denies alcohol use. Likely 2/2 liver disease given history of cirrhosis.  -no further workup needed at this moment  -follow up as needed

## 2017-04-20 NOTE — Assessment & Plan Note (Signed)
Patient asymptomatic.  -follow up with urology on 04/21/17

## 2017-04-20 NOTE — Telephone Encounter (Signed)
Donald Moreno from [facility] called requesting home care for two times a week for two weeks, then one time a week for one week. Per Dr. Sallyanne Kuster this is fine. Verbal orders have been placed. Donald Moreno verbalized her understanding.

## 2017-04-20 NOTE — Assessment & Plan Note (Signed)
Patient admitted for fluid overload. Patient was discharged with lasix 60mg  daily and spironolactone 12.5mg  daily, which he is compliant with. Patient reporting that he urinates very frequently. Patient continues to have 2+ pitting edema in lower extremities bilaterally and scrotal edema.  -increase lasix to 60mg  bid. -increase spironolactone to 25mg  daily  -follow up in 1 week for monitoring of edema and weight. Will need repeat BMP at follow up given increase in spironolactone dose  -have placed order for [facility] consultation to provide scale so patient can weight himself at home. Informed patient that if he notices significant weight gain he should go to emergency room -strict return precautions given

## 2017-04-20 NOTE — Telephone Encounter (Signed)
Donald Moreno will continue to monitor the heart rate and call back if patient becomes symptomatic.

## 2017-04-21 DIAGNOSIS — N471 Phimosis: Secondary | ICD-10-CM | POA: Diagnosis not present

## 2017-04-21 DIAGNOSIS — N401 Enlarged prostate with lower urinary tract symptoms: Secondary | ICD-10-CM | POA: Diagnosis not present

## 2017-04-21 DIAGNOSIS — R310 Gross hematuria: Secondary | ICD-10-CM | POA: Diagnosis not present

## 2017-04-21 DIAGNOSIS — R350 Frequency of micturition: Secondary | ICD-10-CM | POA: Diagnosis not present

## 2017-04-22 DIAGNOSIS — K766 Portal hypertension: Secondary | ICD-10-CM | POA: Diagnosis not present

## 2017-04-22 DIAGNOSIS — E46 Unspecified protein-calorie malnutrition: Secondary | ICD-10-CM | POA: Diagnosis not present

## 2017-04-22 DIAGNOSIS — N183 Chronic kidney disease, stage 3 (moderate): Secondary | ICD-10-CM | POA: Diagnosis not present

## 2017-04-22 DIAGNOSIS — D7589 Other specified diseases of blood and blood-forming organs: Secondary | ICD-10-CM | POA: Diagnosis not present

## 2017-04-22 DIAGNOSIS — I509 Heart failure, unspecified: Secondary | ICD-10-CM | POA: Diagnosis not present

## 2017-04-22 DIAGNOSIS — Z7951 Long term (current) use of inhaled steroids: Secondary | ICD-10-CM | POA: Diagnosis not present

## 2017-04-22 DIAGNOSIS — M109 Gout, unspecified: Secondary | ICD-10-CM | POA: Diagnosis not present

## 2017-04-22 DIAGNOSIS — K7460 Unspecified cirrhosis of liver: Secondary | ICD-10-CM | POA: Diagnosis not present

## 2017-04-22 DIAGNOSIS — J45909 Unspecified asthma, uncomplicated: Secondary | ICD-10-CM | POA: Diagnosis not present

## 2017-04-25 ENCOUNTER — Other Ambulatory Visit: Payer: Self-pay | Admitting: *Deleted

## 2017-04-25 ENCOUNTER — Ambulatory Visit: Payer: Self-pay | Admitting: *Deleted

## 2017-04-25 ENCOUNTER — Encounter: Payer: Self-pay | Admitting: *Deleted

## 2017-04-25 NOTE — Patient Outreach (Signed)
[facility]) Care Management  04/25/2017  Owin Vignola 27-Aug-1940 [id number]   Home visit scheduled for this afternoon, canceled due to weather.  Call placed to member to complete initial assessment telephonically, no answer.  HIPAA compliant voice message left.  Will await call back, if no call back will follow up within a week.  Valente David, MSN, RN [facility] Care Management  [facility] Manager [phone]

## 2017-04-25 NOTE — Progress Notes (Signed)
This encounter was created in error - please disregard.

## 2017-04-26 DIAGNOSIS — J45909 Unspecified asthma, uncomplicated: Secondary | ICD-10-CM | POA: Diagnosis not present

## 2017-04-26 DIAGNOSIS — I509 Heart failure, unspecified: Secondary | ICD-10-CM | POA: Diagnosis not present

## 2017-04-26 DIAGNOSIS — N183 Chronic kidney disease, stage 3 (moderate): Secondary | ICD-10-CM | POA: Diagnosis not present

## 2017-04-26 DIAGNOSIS — K7460 Unspecified cirrhosis of liver: Secondary | ICD-10-CM | POA: Diagnosis not present

## 2017-04-26 DIAGNOSIS — Z7951 Long term (current) use of inhaled steroids: Secondary | ICD-10-CM | POA: Diagnosis not present

## 2017-04-26 DIAGNOSIS — M109 Gout, unspecified: Secondary | ICD-10-CM | POA: Diagnosis not present

## 2017-04-26 DIAGNOSIS — E46 Unspecified protein-calorie malnutrition: Secondary | ICD-10-CM | POA: Diagnosis not present

## 2017-04-26 DIAGNOSIS — K766 Portal hypertension: Secondary | ICD-10-CM | POA: Diagnosis not present

## 2017-04-26 DIAGNOSIS — D7589 Other specified diseases of blood and blood-forming organs: Secondary | ICD-10-CM | POA: Diagnosis not present

## 2017-04-27 ENCOUNTER — Ambulatory Visit (INDEPENDENT_AMBULATORY_CARE_PROVIDER_SITE_OTHER): Payer: Medicare Other | Admitting: Internal Medicine

## 2017-04-27 ENCOUNTER — Other Ambulatory Visit: Payer: Self-pay

## 2017-04-27 ENCOUNTER — Encounter: Payer: Self-pay | Admitting: Internal Medicine

## 2017-04-27 VITALS — BP 130/60 | HR 50 | Temp 97.7°F | Ht 70.0 in | Wt 179.0 lb

## 2017-04-27 DIAGNOSIS — R001 Bradycardia, unspecified: Secondary | ICD-10-CM

## 2017-04-27 DIAGNOSIS — I509 Heart failure, unspecified: Secondary | ICD-10-CM | POA: Diagnosis not present

## 2017-04-27 DIAGNOSIS — I10 Essential (primary) hypertension: Secondary | ICD-10-CM | POA: Diagnosis not present

## 2017-04-27 MED ORDER — BISOPROLOL FUMARATE 5 MG PO TABS
2.5000 mg | ORAL_TABLET | Freq: Every day | ORAL | 0 refills | Status: DC
Start: 2017-04-27 — End: 2017-04-27

## 2017-04-27 MED ORDER — FUROSEMIDE 40 MG PO TABS: 60.0000 mg | ORAL_TABLET | Freq: Two times a day (BID) | ORAL | 0 refills | Status: DC

## 2017-04-27 MED ORDER — BISOPROLOL FUMARATE 5 MG PO TABS: 2.5000 mg | ORAL_TABLET | Freq: Every day | ORAL | 0 refills | Status: DC

## 2017-04-27 NOTE — Progress Notes (Signed)
   [facility] Phone: [phone]  Subjective:  Donald Moreno is a 76 year old male presenting to clinic for follow-up of his CHF. He was recently hospitalized from 11/27-12/3 for CHF exacerbation. He was seen in clinic 12/6 for his hospital follow-up appointment. At that visit, Lasix was increased from 60mg  daily to 60mg  bid and Spironolactone was increased from 12.5mg  daily to 25mg  daily. He returns to clinic today for follow-up of his weight and to have a BMP done. He states he has been urinating a lot with the lasix. He has noticed that his lower extremity edema has greatly improved. He was previously having swelling up to his knees, but now he is just having swelling in his feet and ankles. No shortness of breath, no orthopnea.  Patient also notes that he has been taking a full tablet of his Bisoprolol instead of a 1/2 tablet as prescribed. No dizziness, no fatigue.  ROS: See HPI for pertinent positives and negatives  Past Medical History- HTN, CHF, asthma, alcoholic cirrhosis with ascites, CKD III, macrocytic anemia.  Family history reviewed for today's visit. No changes.  Social history- patient is a never smoker, quit drinking 2 years ago.  Objective: BP 130/60 (BP Location: Left Arm, Patient Position: Sitting, Cuff Size: Normal)   Pulse (!) 50   Temp 97.7 F (36.5 C) (Oral)   Ht 5\' 10"  (1.778 m)   Wt 179 lb (81.2 kg)   SpO2 98%   BMI 25.68 kg/m  Gen: NAD, alert, cooperative with exam HEENT: NCAT, EOMI, MMM Neck: FROM, supple, no JVD CV: Bradycardic, regular rhythm, no murmur Resp: Fine crackles in the left lung base, lungs otherwise clear, no wheezing, normal work of breathing. Msk: Pitting edema in the ankles and feet, lower extremities wrapped in ace bandages.  Assessment/Plan: CHF: Improving. Weight down from 187lb to 179lb in 1 week. Dry weight unclear, but he was in the 160s in August of this year. Lower extremity edema improving. Still mildly volume  overloaded. - Continue Lasix 60mg  bid and Spironolactone 25mg  daily - Check BMP - Follow-up with PCP in 1 week for recheck  Bradycardia: HR 50 in clinic today. Asymptomatic. Likely related to his beta blocker. Should be taking Bisoprolol 2.5mg  daily, but has been taking 5mg  daily. - Decrease Bisoprolol to 2.5mg  daily - Follow-up with PCP in 1 week   Hyman Bible, MD PGY-3

## 2017-04-27 NOTE — Patient Instructions (Signed)
It was so nice to meet you!   Let's keep your Spironolactone and Lasix dose the same for right now. Please make sure you are only taking 1/2 tablet of the Bisoprolol instead of the whole tablet.  We will see you back in 1 week!  -Dr. Brett Albino

## 2017-04-28 DIAGNOSIS — N183 Chronic kidney disease, stage 3 (moderate): Secondary | ICD-10-CM | POA: Diagnosis not present

## 2017-04-28 DIAGNOSIS — K766 Portal hypertension: Secondary | ICD-10-CM | POA: Diagnosis not present

## 2017-04-28 DIAGNOSIS — K7460 Unspecified cirrhosis of liver: Secondary | ICD-10-CM | POA: Diagnosis not present

## 2017-04-28 DIAGNOSIS — I509 Heart failure, unspecified: Secondary | ICD-10-CM | POA: Diagnosis not present

## 2017-04-28 DIAGNOSIS — J45909 Unspecified asthma, uncomplicated: Secondary | ICD-10-CM | POA: Diagnosis not present

## 2017-04-28 DIAGNOSIS — M109 Gout, unspecified: Secondary | ICD-10-CM | POA: Diagnosis not present

## 2017-04-28 DIAGNOSIS — D7589 Other specified diseases of blood and blood-forming organs: Secondary | ICD-10-CM | POA: Diagnosis not present

## 2017-04-28 DIAGNOSIS — Z7951 Long term (current) use of inhaled steroids: Secondary | ICD-10-CM | POA: Diagnosis not present

## 2017-04-28 DIAGNOSIS — E46 Unspecified protein-calorie malnutrition: Secondary | ICD-10-CM | POA: Diagnosis not present

## 2017-04-28 LAB — BASIC METABOLIC PANEL
BUN/Creatinine Ratio: 21 (ref 10–24)
BUN: 40 mg/dL — ABNORMAL HIGH (ref 8–27)
CALCIUM: 9.5 mg/dL (ref 8.6–10.2)
CHLORIDE: 103 mmol/L (ref 96–106)
CO2: 27 mmol/L (ref 20–29)
Creatinine, Ser: 1.93 mg/dL — ABNORMAL HIGH (ref 0.76–1.27)
GFR calc non Af Amer: 33 mL/min/{1.73_m2} — ABNORMAL LOW (ref >59)
GFR, EST AFRICAN AMERICAN: 39 mL/min/{1.73_m2} — AB (ref >59)
Glucose: 113 mg/dL — ABNORMAL HIGH (ref 65–99)
POTASSIUM: 4.9 mmol/L (ref 3.5–5.2)
SODIUM: 141 mmol/L (ref 134–144)

## 2017-04-28 NOTE — Assessment & Plan Note (Signed)
HR 50 in clinic today. Asymptomatic. Likely related to his beta blocker. Should be taking Bisoprolol 2.5mg  daily, but has been taking 5mg  daily. - Decrease Bisoprolol to 2.5mg  daily - Follow-up with PCP in 1 week

## 2017-04-28 NOTE — Assessment & Plan Note (Signed)
Improving. Weight down from 187lb to 179lb in 1 week. Dry weight unclear, but he was in the 160s in August of this year. Lower extremity edema improving. Still mildly volume overloaded. - Continue Lasix 60mg  bid and Spironolactone 25mg  daily - Check BMP - Follow-up with PCP in 1 week for recheck

## 2017-05-03 ENCOUNTER — Encounter: Payer: Self-pay | Admitting: *Deleted

## 2017-05-03 ENCOUNTER — Other Ambulatory Visit: Payer: Self-pay | Admitting: *Deleted

## 2017-05-03 ENCOUNTER — Telehealth: Payer: Self-pay | Admitting: Internal Medicine

## 2017-05-03 NOTE — Telephone Encounter (Signed)
Called patient to discuss his lab results. Creatinine increased a little bit. I have scheduled an appointment for him to see me in 1 week to follow-up on his swelling and to recheck a BMP.  Hyman Bible, MD PGY-3

## 2017-05-03 NOTE — Patient Outreach (Signed)
[facility]) Care Management  05/03/2017  Farley Crooker [birth year] [id number]   Weekly transition of care call placed to member.  Intention was also to complete initial assessment due to canceled home visit last week.  Member report he has been doing well with the support of his family and home health.  He denies the need for additional help at this time.  Of note, both he and nephew were reluctant to [facility] involvement during initial call.  He is advised that this care manager will close case and notify primary MD, also advised to contact [facility] should his condition change where more assistance is needed.  Will notify care management assistant to close case.  Valente David, [place], MSN [place] [phone]

## 2017-05-05 DIAGNOSIS — Z7951 Long term (current) use of inhaled steroids: Secondary | ICD-10-CM | POA: Diagnosis not present

## 2017-05-05 DIAGNOSIS — K766 Portal hypertension: Secondary | ICD-10-CM | POA: Diagnosis not present

## 2017-05-05 DIAGNOSIS — E46 Unspecified protein-calorie malnutrition: Secondary | ICD-10-CM | POA: Diagnosis not present

## 2017-05-05 DIAGNOSIS — D7589 Other specified diseases of blood and blood-forming organs: Secondary | ICD-10-CM | POA: Diagnosis not present

## 2017-05-05 DIAGNOSIS — K7460 Unspecified cirrhosis of liver: Secondary | ICD-10-CM | POA: Diagnosis not present

## 2017-05-05 DIAGNOSIS — J45909 Unspecified asthma, uncomplicated: Secondary | ICD-10-CM | POA: Diagnosis not present

## 2017-05-05 DIAGNOSIS — I509 Heart failure, unspecified: Secondary | ICD-10-CM | POA: Diagnosis not present

## 2017-05-05 DIAGNOSIS — N183 Chronic kidney disease, stage 3 (moderate): Secondary | ICD-10-CM | POA: Diagnosis not present

## 2017-05-05 DIAGNOSIS — M109 Gout, unspecified: Secondary | ICD-10-CM | POA: Diagnosis not present

## 2017-05-10 ENCOUNTER — Encounter: Payer: Self-pay | Admitting: Internal Medicine

## 2017-05-10 ENCOUNTER — Ambulatory Visit (INDEPENDENT_AMBULATORY_CARE_PROVIDER_SITE_OTHER): Payer: Medicare Other | Admitting: Internal Medicine

## 2017-05-10 ENCOUNTER — Other Ambulatory Visit: Payer: Self-pay

## 2017-05-10 VITALS — BP 132/58 | HR 55 | Temp 98.2°F | Wt 165.0 lb

## 2017-05-10 DIAGNOSIS — I5022 Chronic systolic (congestive) heart failure: Secondary | ICD-10-CM | POA: Diagnosis not present

## 2017-05-10 DIAGNOSIS — K7031 Alcoholic cirrhosis of liver with ascites: Secondary | ICD-10-CM | POA: Diagnosis not present

## 2017-05-10 MED ORDER — BISOPROLOL FUMARATE 5 MG PO TABS: 2.5000 mg | ORAL_TABLET | Freq: Every day | ORAL | 0 refills | Status: DC

## 2017-05-10 NOTE — Progress Notes (Signed)
   [facility] Phone: [phone]  Subjective:  Donald Moreno is a 76 year old male presenting to clinic for follow-up of CHF. He was hospitalized from 11/27-12/3 for CHF exacerbation. He has been taking Lasix 60mg  bid and Spironolactone 25mg  daily. Still urinating a lot with the lasix. No shortness of breath, no orthopnea. Lower extremity edema has almost completely resolved. No dizziness.  ROS: See HPI for pertinent positives and negatives  Past Medical History- HTN, CHF, alcoholic cirrhosis with ascites, CKD III, macrocytic anemia.  Family history reviewed for today's visit. No changes.  Social history- patient is a never smoker. Former alcohol abuse but quit 2 years ago.  Objective: BP (!) 132/58   Pulse (!) 55   Temp 98.2 F (36.8 C) (Oral)   Wt 165 lb (74.8 kg)   SpO2 96%   BMI 23.68 kg/m  Gen: NAD, alert, cooperative with exam HEENT: NCAT, EOMI, MMM Neck: FROM, supple, no JVD CV: RRR, no murmur Resp: CTABL, no wheezes, normal work of breathing, no crackles Msk: Lower extremities wrapped in ACE bandages. No edema noted.  Assessment/Plan: Chronic CHF: Continues to improve. Weight decreased from 179lb on 12/13 to 165lb today. Unclear dry weight, but has been in the 140s-160s in June-August of this year. - Continue Lasix 60mg  bid and Spironolactone 25mg  daily for now - Will check BMP today and will make medication adjustments based on that. Can likely go down on the Lasix. - Will have him follow-up with PCP in the next 1-2 weeks.   Hyman Bible, MD PGY-3

## 2017-05-10 NOTE — Patient Instructions (Signed)
It was so nice to see you today!  Everything looks great today! Your lungs don't have any fluid in them and the swelling in your legs looks much better.  I will check your kidneys and electrolytes again today and I will call you with these results.  I sent a refill of the Bisoprolol into your pharmacy so please pick this up as soon as possible.  -Dr. Brett Albino

## 2017-05-10 NOTE — Assessment & Plan Note (Signed)
Continues to improve. Weight decreased from 179lb on 12/13 to 165lb today. Unclear dry weight, but has been in the 140s-160s in June-August of this year. - Continue Lasix 60mg  bid and Spironolactone 25mg  daily for now - Will check BMP today and will make medication adjustments based on that. Can likely go down on the Lasix. - Will have him follow-up with PCP in the next 1-2 weeks.

## 2017-05-11 LAB — BASIC METABOLIC PANEL
BUN / CREAT RATIO: 23 (ref 10–24)
BUN: 42 mg/dL — ABNORMAL HIGH (ref 8–27)
CO2: 23 mmol/L (ref 20–29)
CREATININE: 1.79 mg/dL — AB (ref 0.76–1.27)
Calcium: 9.6 mg/dL (ref 8.6–10.2)
Chloride: 103 mmol/L (ref 96–106)
GFR calc Af Amer: 42 mL/min/{1.73_m2} — ABNORMAL LOW (ref >59)
GFR, EST NON AFRICAN AMERICAN: 37 mL/min/{1.73_m2} — AB (ref >59)
GLUCOSE: 103 mg/dL — AB (ref 65–99)
Potassium: 4.1 mmol/L (ref 3.5–5.2)
SODIUM: 143 mmol/L (ref 134–144)

## 2017-05-12 ENCOUNTER — Telehealth: Payer: Self-pay | Admitting: *Deleted

## 2017-05-12 ENCOUNTER — Encounter: Payer: Self-pay | Admitting: *Deleted

## 2017-05-12 NOTE — Telephone Encounter (Signed)
-----   Message from Sela Hua, MD sent at 05/12/2017  7:18 AM EST ----- Please let patient know that his labs looked good at his most recent visit. His potassium was normal and his kidneys are stable. He needs a follow-up appointment with his PCP in 1 week, so can we please get this scheduled? He also requested that a copy of his lab results be sent to him in the mail. Would you mind printing out his labs? Thanks SO much!

## 2017-05-12 NOTE — Telephone Encounter (Signed)
Yes, patient should be on 2.5mg  daily, as he was bradycardic to 50 on 5mg  daily. Thanks!

## 2017-05-12 NOTE — Telephone Encounter (Signed)
Lm for patient with lab results and asked him to call back to schedule an appointment. Brnadon Eoff,CMA

## 2017-05-12 NOTE — Telephone Encounter (Signed)
Spoke with Donald Moreno at [facility] who needed clarification on zebeta. Patient was previously on 10 mg daily but new script is for 5 mg take 1/2 daily. Per Dr. Velia Meyer note on 04/27/2017 patient should be on 2.5 mg daily. Jason informed. Hubbard Hartshorn, RN, BSN

## 2017-05-15 DIAGNOSIS — N183 Chronic kidney disease, stage 3 (moderate): Secondary | ICD-10-CM | POA: Diagnosis not present

## 2017-05-15 DIAGNOSIS — N2581 Secondary hyperparathyroidism of renal origin: Secondary | ICD-10-CM | POA: Diagnosis not present

## 2017-05-15 DIAGNOSIS — N189 Chronic kidney disease, unspecified: Secondary | ICD-10-CM | POA: Diagnosis not present

## 2017-05-17 ENCOUNTER — Other Ambulatory Visit: Payer: Self-pay | Admitting: Family Medicine

## 2017-05-18 DIAGNOSIS — N503 Cyst of epididymis: Secondary | ICD-10-CM | POA: Diagnosis not present

## 2017-05-18 DIAGNOSIS — R310 Gross hematuria: Secondary | ICD-10-CM | POA: Diagnosis not present

## 2017-05-25 DIAGNOSIS — D631 Anemia in chronic kidney disease: Secondary | ICD-10-CM | POA: Diagnosis not present

## 2017-05-25 DIAGNOSIS — N2581 Secondary hyperparathyroidism of renal origin: Secondary | ICD-10-CM | POA: Diagnosis not present

## 2017-05-25 DIAGNOSIS — I129 Hypertensive chronic kidney disease with stage 1 through stage 4 chronic kidney disease, or unspecified chronic kidney disease: Secondary | ICD-10-CM | POA: Diagnosis not present

## 2017-05-25 DIAGNOSIS — N183 Chronic kidney disease, stage 3 (moderate): Secondary | ICD-10-CM | POA: Diagnosis not present

## 2017-05-29 NOTE — Telephone Encounter (Signed)
Open encounter clean up.Donald Moreno, CMA

## 2017-05-31 ENCOUNTER — Other Ambulatory Visit: Payer: Self-pay

## 2017-05-31 DIAGNOSIS — I509 Heart failure, unspecified: Secondary | ICD-10-CM

## 2017-05-31 MED ORDER — SPIRONOLACTONE 25 MG PO TABS: 25.0000 mg | ORAL_TABLET | Freq: Every day | ORAL | 0 refills | Status: DC

## 2017-06-01 ENCOUNTER — Other Ambulatory Visit: Payer: Self-pay

## 2017-06-01 DIAGNOSIS — I509 Heart failure, unspecified: Secondary | ICD-10-CM

## 2017-06-01 MED ORDER — SPIRONOLACTONE 25 MG PO TABS: 25.0000 mg | ORAL_TABLET | Freq: Every day | ORAL | 0 refills | Status: DC

## 2017-06-14 IMAGING — US US PARACENTESIS
1 series · 1 of 1 positions shown · non-contrast
Comparison: Previous paracentesis

MEDICATIONS:
10 cc 1% lidocaine.

COMPLICATIONS:
None immediate.

INDICATION: Recurrent ascites

EXAM:
ULTRASOUND-GUIDED PARACENTESIS
TECHNIQUE: Informed written consent was obtained from the patient after a
discussion of the risks, benefits and alternatives to treatment. A
timeout was performed prior to the initiation of the procedure.

[Series 1: us paracentesis · 0.31mm/px · 1 of 1 slices shown]
[im 1/1]
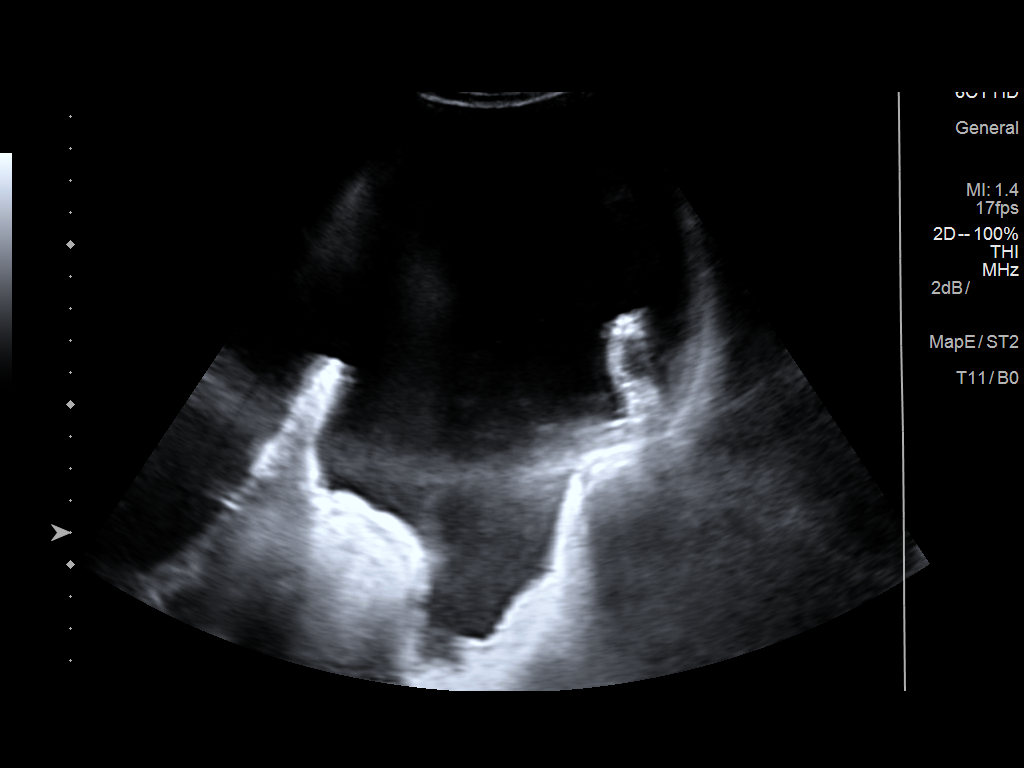

[1 of 1 positions shown; findings below may reference images not displayed]

Initial ultrasound scanning demonstrates a large amount of ascites
within the right lower abdominal quadrant. The right lower abdomen
was prepped and draped in the usual sterile fashion. 1% lidocaine
with epinephrine was used for local anesthesia. Under direct
ultrasound guidance, a 19 gauge, 7-cm, Yueh catheter was introduced.
An ultrasound image was saved for documentation purposed. The
paracentesis was performed. The catheter was removed and a dressing
was applied. The patient tolerated the procedure well without
immediate post procedural complication.
FINDINGS: A total of approximately 6 liters of yellow fluid was removed.

Maximum per MEISTER.

Samples were sent to the laboratory as requested by the clinical
team.
IMPRESSION: Successful ultrasound-guided paracentesis yielding 6 liters of
peritoneal fluid.

Read by

Deshmukh Samaddar

## 2017-06-15 DIAGNOSIS — I8500 Esophageal varices without bleeding: Secondary | ICD-10-CM | POA: Diagnosis not present

## 2017-06-15 DIAGNOSIS — K7460 Unspecified cirrhosis of liver: Secondary | ICD-10-CM | POA: Diagnosis not present

## 2017-06-15 DIAGNOSIS — R188 Other ascites: Secondary | ICD-10-CM | POA: Diagnosis not present

## 2017-06-26 ENCOUNTER — Other Ambulatory Visit: Payer: Self-pay | Admitting: Internal Medicine

## 2017-06-30 ENCOUNTER — Other Ambulatory Visit: Payer: Self-pay

## 2017-07-05 ENCOUNTER — Other Ambulatory Visit: Payer: Self-pay | Admitting: Internal Medicine

## 2017-07-09 ENCOUNTER — Other Ambulatory Visit: Payer: Self-pay | Admitting: Internal Medicine

## 2017-07-11 ENCOUNTER — Ambulatory Visit (INDEPENDENT_AMBULATORY_CARE_PROVIDER_SITE_OTHER): Payer: Medicare Other | Admitting: Family Medicine

## 2017-07-11 ENCOUNTER — Other Ambulatory Visit: Payer: Self-pay

## 2017-07-11 ENCOUNTER — Encounter: Payer: Self-pay | Admitting: Family Medicine

## 2017-07-11 VITALS — BP 132/60 | HR 44 | Temp 97.7°F | Ht 70.0 in | Wt 168.0 lb

## 2017-07-11 DIAGNOSIS — M109 Gout, unspecified: Secondary | ICD-10-CM | POA: Diagnosis not present

## 2017-07-11 DIAGNOSIS — R001 Bradycardia, unspecified: Secondary | ICD-10-CM | POA: Diagnosis not present

## 2017-07-11 DIAGNOSIS — I5022 Chronic systolic (congestive) heart failure: Secondary | ICD-10-CM | POA: Diagnosis not present

## 2017-07-11 DIAGNOSIS — I10 Essential (primary) hypertension: Secondary | ICD-10-CM

## 2017-07-11 DIAGNOSIS — L84 Corns and callosities: Secondary | ICD-10-CM

## 2017-07-11 DIAGNOSIS — K7031 Alcoholic cirrhosis of liver with ascites: Secondary | ICD-10-CM | POA: Diagnosis not present

## 2017-07-11 MED ORDER — LACTULOSE 10 GM/15ML PO SOLN: 10.0000 g | Freq: Two times a day (BID) | ORAL | 0 refills | Status: DC | PRN

## 2017-07-11 MED ORDER — ALLOPURINOL 300 MG PO TABS: 300.0000 mg | ORAL_TABLET | Freq: Every day | ORAL | 1 refills | Status: DC

## 2017-07-11 NOTE — Assessment & Plan Note (Signed)
Will check CBC today. Patient without ascites on exam.

## 2017-07-11 NOTE — Assessment & Plan Note (Signed)
HR 44 in clinic today. Asymptomatic. Per last cardiology note, he is to remain on bisoprolol unless he is symptomatic or HR in 30's. Patient taking 2.5 mg daily.

## 2017-07-11 NOTE — Patient Instructions (Addendum)
Thank you for coming to see me today. It was a pleasure! Today we talked about:   Your feet: I have put in a referral, but you may call [facility] at [phone] tomorrow to set up an appointment.   Your heart: Continue your current medications we reviewed today. I will send in your refills to [facility].   Please follow-up with me in 2 months or sooner as needed.  If you have any questions or concerns, please do not hesitate to call the office at [phone].  Take Care,   [place] Hayly Litsey, DO

## 2017-07-11 NOTE — Assessment & Plan Note (Signed)
Patient feeling great. Weight stable at 168lb today, 165 at last visit. Unclear dry weight, but has been in the 140s-160s in June-August 2018. - Continue Lasix 60mg  bid and Spironolactone 25mg  daily for now - Will check BMP today and will make medication adjustments based on that.  - Will have him follow-up within the next 2 months.

## 2017-07-11 NOTE — Assessment & Plan Note (Signed)
Will refer to podiatry for care of toenails as well as corns on bilateral proximal area of great toe metatarsals on plantar aspect of feet.

## 2017-07-11 NOTE — Progress Notes (Signed)
   Subjective:    Patient ID: Donald Moreno, male    DOB: 1941/02/09, 77 y.o.   MRN: [id number]   CC:  HPI:  Donald Moreno is a 77 year old male presenting to clinic for follow-up of CHF. - He has been taking Lasix 60mg  bid and Spironolactone 25mg  daily.  - Still urinating a lot with the lasix.  - No shortness of breath, no orthopnea.  - Lower extremity edema is stable from previous.  - No dizziness.  Corn on feet: his biggest concern today - corns on bilateral proximal area of great toe metatarsals on plantar aspect of feet - hurts patient to walk - reports his niece takes care of his feet, but toe nails have overgrown  Smoking status reviewed  Review of Systems Per HPI, else denies recent illness, fever, headache, changes in vision, chest pain, shortness of breath, abdominal pain, N/V/D, weakness, swelling   Patient Active Problem List   Diagnosis Date Noted  . Corn or callus 07/11/2017  . Macrocytic anemia 04/20/2017  . Cirrhosis of liver with ascites ([place])   . Congestive heart failure ([place])   . Hematuria 01/30/2017  . Alcoholic cirrhosis of liver with ascites ([place])   . Typical atrial flutter ([place])   . CKD (chronic kidney disease) stage 3, GFR 30-59 ml/min (HCC) 05/27/2015  . At risk for decreased bone density 03/04/2015  . Bradycardia 07/08/2013  . Asthma 01/29/2010  . NICOTINE ADDICTION 04/30/2007  . Gout 07/13/2006  . HYPERTENSION, BENIGN SYSTEMIC 07/13/2006     Objective:  BP 132/60   Pulse (!) 44   Temp 97.7 F (36.5 C) (Oral)   Ht 5\' 10"  (1.778 m)   Wt 168 lb (76.2 kg)   SpO2 99%   BMI 24.11 kg/m  Vitals and nursing note reviewed  General: NAD, pleasant Cardiac: bradycardia with regular rhythm, normal heart sounds, no murmurs Respiratory: CTAB, normal effort Abdomen: soft, nontender, nondistended. Bowel sounds present. No significant ascites noted on exam Extremities: no cyanosis. WWP. 2+ pitting edema to knee on BLLE Skin: warm and dry, no rashes noted.  corns on bilateral proximal area of great toe metatarsals on plantar aspect of feet Neuro: alert and oriented, no focal deficits Psych: normal affect  Assessment & Plan:   Congestive heart failure ([place]) Patient feeling great. Weight stable at 168lb today, 165 at last visit. Unclear dry weight, but has been in the 140s-160s in June-August 2018. - Continue Lasix 60mg  bid and Spironolactone 25mg  daily for now - Will check BMP today and will make medication adjustments based on that.  - Will have him follow-up within the next 2 months.  Bradycardia HR 44 in clinic today. Asymptomatic. Per last cardiology note, he is to remain on bisoprolol unless he is symptomatic or HR in 30's. Patient taking 2.5 mg daily.   Gout Refilled allopurinol for patient. Reporting no signs of gout flare.  Corn or callus Will refer to podiatry for care of toenails as well as corns on bilateral proximal area of great toe metatarsals on plantar aspect of feet.   Alcoholic cirrhosis of liver with ascites ([place]) Will check CBC today. Patient without ascites on exam.    Donald Ahyana Skillin, DO Family Medicine Resident PGY-1

## 2017-07-11 NOTE — Assessment & Plan Note (Signed)
Refilled allopurinol for patient. Reporting no signs of gout flare.

## 2017-07-12 LAB — BASIC METABOLIC PANEL
BUN / CREAT RATIO: 16 (ref 10–24)
BUN: 38 mg/dL — AB (ref 8–27)
CO2: 21 mmol/L (ref 20–29)
CREATININE: 2.44 mg/dL — AB (ref 0.76–1.27)
Calcium: 9.9 mg/dL (ref 8.6–10.2)
Chloride: 103 mmol/L (ref 96–106)
GFR calc Af Amer: 29 mL/min/{1.73_m2} — ABNORMAL LOW (ref >59)
GFR, EST NON AFRICAN AMERICAN: 25 mL/min/{1.73_m2} — AB (ref >59)
GLUCOSE: 98 mg/dL (ref 65–99)
Potassium: 4.1 mmol/L (ref 3.5–5.2)
Sodium: 141 mmol/L (ref 134–144)

## 2017-07-12 LAB — CBC
HEMATOCRIT: 33.2 % — AB (ref 37.5–51.0)
Hemoglobin: 11.4 g/dL — ABNORMAL LOW (ref 13.0–17.7)
MCH: 34.5 pg — AB (ref 26.6–33.0)
MCHC: 34.3 g/dL (ref 31.5–35.7)
MCV: 101 fL — AB (ref 79–97)
Platelets: 164 10*3/uL (ref 150–379)
RBC: 3.3 x10E6/uL — AB (ref 4.14–5.80)
RDW: 15.4 % (ref 12.3–15.4)
WBC: 6.4 10*3/uL (ref 3.4–10.8)

## 2017-07-13 ENCOUNTER — Other Ambulatory Visit: Payer: Self-pay

## 2017-07-13 ENCOUNTER — Ambulatory Visit (INDEPENDENT_AMBULATORY_CARE_PROVIDER_SITE_OTHER): Payer: Medicare Other | Admitting: Family Medicine

## 2017-07-13 ENCOUNTER — Encounter: Payer: Self-pay | Admitting: Family Medicine

## 2017-07-13 DIAGNOSIS — I5022 Chronic systolic (congestive) heart failure: Secondary | ICD-10-CM

## 2017-07-13 NOTE — Patient Instructions (Addendum)
Were seen in clinic today to review your labs from yesterday.  It appears your kidney function has worsened a little bit and I suspect this is due to your Lasix medication.  I am reducing your current dose from 60 mg twice a day to 60 mg once a day.  Please continue to do this until your follow-up visit in 2 weeks.  At this time, we will evaluate you for signs of fluid as well as recheck your kidney function to make sure it is improving.  In the meantime, if you have any new or worsening symptoms please call clinic to be seen by a provider.   Be well, Lovenia Kim MD

## 2017-07-13 NOTE — Progress Notes (Signed)
   Subjective:   Patient ID: Donald Moreno    DOB: June 01, 1940, 77 y.o. male   MRN: [id number]  CC: discuss lab results   HPI: Donald Moreno is a 77 y.o. male who presents to clinic today to discuss lab results.  Pt following up from visit yesterday with Dr. Enid Derry.  BMP with SCr was found to be elevated to 2.44 from his baseline 1.8-1.9.  Patient denies symptoms and states he feels great.  No  CP, SOB, palpitations or dyspnea on exertion.  Denies leg swelling and orthopnea.  Weight is stable at 166 lbs.  He has been taking 60 mg Lasix BID and Spironolactone 25 mg daily.  No other concerns at this time.    ROS: Denies fever, chills, nausea, vomiting.  No CP, shortness of breath, dizziness, palpitations, leg swelling.   Social: pt is a never smoker  Medications reviewed. Objective:   BP 138/62   Pulse (!) 46   Temp 98.1 F (36.7 C) (Oral)   Ht 5\' 10"  (1.778 m)   Wt 166 lb 6.4 oz (75.5 kg)   SpO2 98%   BMI 23.88 kg/m  Vitals and nursing note reviewed.  General: pleasant 77 yo male, NAD  Neck: supple, no JVD  CV: RRR no MRG  Lungs: CTAB, normal effort, no crackles or wheeze present  Abdomen: soft, NTND, no ascites noted , +bs  Skin: warm, dry, no rash Extremities: warm and well perfused, 1+ pitting edema present to mid-shin bilaterally in lower extremities   Assessment & Plan:   Congestive heart failure (HCC) Pt appears well-hydrated and euvolemic on exam.  Weight stable at 166 lbs. Lungs clear without evidence of overt fluid overload. In setting of elevated SCr, would recommend reducing Lasix dose to 60 mg once daily.   -Discussed return in 1 week to repeat BMET; will re-evaluate fluid status at that time  -May continue Spironolactone 25mg  daily for now -Return precautions discussed  -follow up: 1 week    Donald Kim, MD [place], PGY-2 07/19/2017 10:38 PM

## 2017-07-19 NOTE — Assessment & Plan Note (Signed)
Pt appears well-hydrated and euvolemic on exam.  Weight stable at 166 lbs. Lungs clear without evidence of overt fluid overload. In setting of elevated SCr, would recommend reducing Lasix dose to 60 mg once daily.   -Discussed return in 1 week to repeat BMET; will re-evaluate fluid status at that time  -May continue Spironolactone 25mg  daily for now -Return precautions discussed  -follow up: 1 week

## 2017-07-20 ENCOUNTER — Other Ambulatory Visit: Payer: Self-pay | Admitting: Family Medicine

## 2017-07-20 DIAGNOSIS — I509 Heart failure, unspecified: Secondary | ICD-10-CM

## 2017-07-21 ENCOUNTER — Other Ambulatory Visit: Payer: Self-pay | Admitting: *Deleted

## 2017-07-21 ENCOUNTER — Encounter: Payer: Self-pay | Admitting: Podiatry

## 2017-07-21 ENCOUNTER — Ambulatory Visit: Payer: Medicare Other | Admitting: Podiatry

## 2017-07-21 VITALS — BP 161/82 | HR 47 | Ht 70.0 in | Wt 165.0 lb

## 2017-07-21 DIAGNOSIS — B351 Tinea unguium: Secondary | ICD-10-CM

## 2017-07-21 DIAGNOSIS — M79609 Pain in unspecified limb: Secondary | ICD-10-CM | POA: Diagnosis not present

## 2017-07-21 DIAGNOSIS — M79676 Pain in unspecified toe(s): Secondary | ICD-10-CM | POA: Diagnosis not present

## 2017-07-21 MED ORDER — LACTULOSE 10 GM/15ML PO SOLN: 10.0000 g | Freq: Two times a day (BID) | ORAL | 0 refills | Status: DC | PRN

## 2017-07-21 NOTE — Progress Notes (Signed)
   Subjective:    Patient ID: Donald Moreno, male    DOB: December 28, 1940, 77 y.o.   MRN: [id number]  HPI  Chief Complaint  Patient presents with  . Nail Problem    bilateral elongated thickened toenails  . Callouses    bilateral callouses       Review of Systems  Musculoskeletal: Positive for arthralgias and joint swelling.  All other systems reviewed and are negative.      Objective:   Physical Exam        Assessment & Plan:

## 2017-07-25 NOTE — Addendum Note (Signed)
Addended by: Dorna Bloom on: 07/25/2017 11:41 AM   Modules accepted: Orders

## 2017-07-25 NOTE — Progress Notes (Signed)
Subjective:  Patient ID: Donald Moreno, male    DOB: September 05, 1940,  MRN: [id number]  Chief Complaint  Patient presents with  . Nail Problem    bilateral elongated thickened toenails  . Callouses    bilateral callouses   77 y.o. male presents with the above complaint.  Patient presents with elongated thickened toenails to both feet that he cannot care for himself.  Reports they are painful when he wear shoes.  Interferes with ambulation.  Also reports painful calluses.  Denies diabetes.  Denies other pedal issues. Past Medical History:  Diagnosis Date  . Acute congestive heart failure ([place])   . Arthritis    "all over"  . Asthma   . Blood in stool 04/11/2017  . Chronic kidney disease    "I see a kidney dr @ [facility]" (10/11/2016)  . Chronic lower back pain    "turned a truck over a long time ago" (10/11/2016)  . Dysrhythmia    bradycardia with occassional junctional rhythm  . ETOH abuse   . Gout   . Hypertension   . Inguinal hernia 10/28/2014   S/P inguinal hernia repair with 4L as ascites removed 03/30/17  . Liver cirrhosis ([place]) 2015  . Macrocytosis   . Pneumonia    "long long time ago" (10/11/2016)  . Spontaneous bacterial peritonitis ([place]) 07/01/2015   Past Surgical History:  Procedure Laterality Date  . INGUINAL HERNIA REPAIR Bilateral 03/30/2017   Procedure: OPEN BILATERAL INGUINAL HERNIA REPAIR WITH MESH;  Surgeon: Kinsinger, Arta Bruce, MD;  Location: [facility];  Service: General;  Laterality: Bilateral;  GENERAL COMBINED WITH REGIONAL FOR POST OP PAIN   . INSERTION OF MESH N/A 10/16/2016   Procedure: INSERTION OF MESH;  Surgeon: Kinsinger, Arta Bruce, MD;  Location: [facility];  Service: General;  Laterality: N/A;  . INSERTION OF MESH Bilateral 03/30/2017   Procedure: INSERTION OF MESH;  Surgeon: Kieth Brightly Arta Bruce, MD;  Location: [facility];  Service: General;  Laterality: Bilateral;  GENERAL COMBINED WITH REGIONAL FOR POST OP PAIN   . IR PARACENTESIS  08/19/2016  . IR  PARACENTESIS  08/26/2016  . IR PARACENTESIS  09/02/2016  . IR PARACENTESIS  09/09/2016  . IR PARACENTESIS  09/16/2016  . IR PARACENTESIS  10/03/2016  . IR PARACENTESIS  10/11/2016  . IR RADIOLOGIST EVAL & MGMT  09/29/2016  . IR RADIOLOGIST EVAL & MGMT  12/21/2016  . IR TIPS  10/11/2016  . RADIOLOGY WITH ANESTHESIA N/A 10/11/2016   Procedure: TIPS;  Surgeon: Arne Cleveland, MD;  Location: [facility];  Service: Radiology;  Laterality: N/A;  . UMBILICAL HERNIA REPAIR N/A 10/16/2016   Procedure: HERNIA REPAIR UMBILICAL ADULT;  Surgeon: Kinsinger, Arta Bruce, MD;  Location: [facility];  Service: General;  Laterality: N/A;    Current Outpatient Medications:  .  ADVAIR DISKUS 500-50 MCG/DOSE AEPB, INHALE 1 PUFF TWO TIMES  DAILY, Disp: 180 each, Rfl: 1 .  allopurinol (ZYLOPRIM) 300 MG tablet, Take 1 tablet (300 mg total) by mouth daily., Disp: 90 tablet, Rfl: 1 .  bisoprolol (ZEBETA) 5 MG tablet, Take 0.5 tablets (2.5 mg total) by mouth daily., Disp: 90 tablet, Rfl: 0 .  Cholecalciferol (VITAMIN D-3) 5000 units TABS, Take 5,000 Units daily by mouth. , Disp: , Rfl:  .  ferrous sulfate 325 (65 FE) MG tablet, Take 1 tablet (325 mg total) by mouth 2 (two) times daily with a meal., Disp: 60 tablet, Rfl: 3 .  furosemide (LASIX) 40 MG tablet, Take 1.5 tablets (60 mg  total) by mouth 2 (two) times daily., Disp: 90 tablet, Rfl: 0 .  lactulose (CHRONULAC) 10 GM/15ML solution, Take 15 mLs (10 g total) by mouth 2 (two) times daily as needed (need to ensure 2 soft BM in a day.)., Disp: 240 mL, Rfl: 0 .  montelukast (SINGULAIR) 10 MG tablet, TAKE 1 TABLET BY MOUTH AT  BEDTIME (Patient taking differently: TAKE 10 MG BY MOUTH AT  BEDTIME), Disp: 90 tablet, Rfl: 1 .  Multiple Vitamin (MULTIVITAMIN WITH MINERALS) TABS tablet, Take 1 tablet by mouth daily., Disp: 30 tablet, Rfl: 0 .  Nutritional Supplements (ENSURE COMPLETE SHAKE) LIQD, Take 1 Container by mouth daily. (Patient taking differently: Take 1 Container 2 (two) times daily by  mouth. ), Disp: 237 mL, Rfl: 11 .  PROAIR HFA 108 (90 Base) MCG/ACT inhaler, Use 2 puffs every 4 hours  as needed for wheezing or  shortness of breath, Disp: 51 g, Rfl: 3 .  spironolactone (ALDACTONE) 25 MG tablet, Take 1 tablet (25 mg total) by mouth daily., Disp: 90 tablet, Rfl: 0 .  spironolactone (ALDACTONE) 25 MG tablet, TAKE 1 TABLET BY MOUTH EVERY DAY, Disp: 60 tablet, Rfl: 0  No Known Allergies Review of Systems all systems reviewed negative except as noted in HPI Objective:   Vitals:   07/21/17 0800  BP: (!) 161/82  Pulse: (!) 47   General AA&O x3. Normal mood and affect.  Vascular Dorsalis pedis and posterior tibial pulses  present 2+ bilaterally  Capillary refill normal to all digits. Pedal hair growth normal.  Neurologic Epicritic sensation grossly present.  Dermatologic No open lesions. Interspaces clear of maceration. Nails x10 elongated thickened with pain to palpation  Orthopedic: MMT 5/5 in dorsiflexion, plantarflexion, inversion, and eversion. Normal joint ROM without pain or crepitus.   Assessment & Plan:  Patient was evaluated and treated and all questions answered.  Onychomycosis with pain -Advised he does not meet criteria for routine foot care.  Nails debrided x10 with large nail nipper and burr to smooth, patient verbalized relief.  Educated on self-care.  Return if symptoms worsen or fail to improve.

## 2017-07-26 MED ORDER — LACTULOSE 10 GM/15ML PO SOLN: 10.0000 g | Freq: Two times a day (BID) | ORAL | 0 refills | Status: DC | PRN

## 2017-08-01 ENCOUNTER — Other Ambulatory Visit: Payer: Self-pay

## 2017-08-01 ENCOUNTER — Ambulatory Visit (INDEPENDENT_AMBULATORY_CARE_PROVIDER_SITE_OTHER): Payer: Medicare Other | Admitting: Family Medicine

## 2017-08-01 ENCOUNTER — Encounter: Payer: Self-pay | Admitting: Family Medicine

## 2017-08-01 VITALS — BP 130/58 | HR 49 | Temp 98.4°F | Ht 70.0 in | Wt 182.8 lb

## 2017-08-01 DIAGNOSIS — R609 Edema, unspecified: Secondary | ICD-10-CM | POA: Diagnosis not present

## 2017-08-01 DIAGNOSIS — I5022 Chronic systolic (congestive) heart failure: Secondary | ICD-10-CM | POA: Diagnosis not present

## 2017-08-01 DIAGNOSIS — I10 Essential (primary) hypertension: Secondary | ICD-10-CM | POA: Diagnosis not present

## 2017-08-01 LAB — BASIC METABOLIC PANEL
BUN / CREAT RATIO: 16 (ref 10–24)
BUN: 33 mg/dL — ABNORMAL HIGH (ref 8–27)
CHLORIDE: 105 mmol/L (ref 96–106)
CO2: 25 mmol/L (ref 20–29)
Calcium: 10.2 mg/dL (ref 8.6–10.2)
Creatinine, Ser: 2.02 mg/dL — ABNORMAL HIGH (ref 0.76–1.27)
GFR, EST AFRICAN AMERICAN: 36 mL/min/{1.73_m2} — AB (ref >59)
GFR, EST NON AFRICAN AMERICAN: 31 mL/min/{1.73_m2} — AB (ref >59)
Glucose: 91 mg/dL (ref 65–99)
POTASSIUM: 4.3 mmol/L (ref 3.5–5.2)
SODIUM: 135 mmol/L (ref 134–144)

## 2017-08-01 MED ORDER — FUROSEMIDE 10 MG/ML IJ SOLN
40.0000 mg | Freq: Once | INTRAMUSCULAR | Status: AC
  Administered 2017-08-01: 40 mg via INTRAMUSCULAR

## 2017-08-01 MED ORDER — FUROSEMIDE 10 MG/ML IJ SOLN: 80.0000 mg | Freq: Once | INTRAMUSCULAR | Status: DC

## 2017-08-01 MED ORDER — MONTELUKAST SODIUM 10 MG PO TABS: 10.0000 mg | ORAL_TABLET | Freq: Every day | ORAL | 1 refills | Status: DC

## 2017-08-01 NOTE — Patient Instructions (Signed)
Thank you for coming to see me today. It was a pleasure! Today we talked about:   Your fluid overload.  He was given a shot of 80 mg of Lasix in the office in order to help reduce the amount of fluid on the right now.  Please return in the morning at 8:30 AM for follow-up in order to avoid hospitalization.  Please do not take your spironolactone 25 mg tonight, or tomorrow.  If you begin to feel short of breath, have chest pain, or have any trouble breathing please do not hesitate to come into the ED.  Please follow-up tomorrow, 08/01/2017 at 8:30 AM.  If you have any questions or concerns, please do not hesitate to call the office at [phone].  Take Care,   [place] Zakarie Sturdivant, DO

## 2017-08-01 NOTE — Progress Notes (Signed)
Subjective:    Patient ID: Donald Moreno, male    DOB: 13-Dec-1940, 77 y.o.   MRN: [id number]   CC: Follow-up  HPI:   Chronic HFpEF: Last EF 60-65% on 04/12/2017 -Patient reports compliance with his medications including Lasix 60 mg daily and spironolactone 25 mg daily. - Patient reported that he is still going to the bathroom a lot but he does feel as if he is having less output than he was prior to decreasing his Lasix to daily. -Patient endorsing that he uses 2 pillows at night in order to go to bed but this is the same that he has always used -Patient denies any shortness of breath, chest pain -Patient reports that he feels that he is having some swelling in his legs and that this has come and gone.  Patient denies any scrotal swelling. -Patient reports that he feels great  Smoking status reviewed-never smoker, has not drink alcohol in 5 years  Review of Systems Per HPI, else denies recent illness, fever, headache, changes in vision, chest pain, shortness of breath, abdominal pain, N/V/D, weakness   Patient Active Problem List   Diagnosis Date Noted  . Corn or callus 07/11/2017  . Macrocytic anemia 04/20/2017  . Cirrhosis of liver with ascites ([place])   . Congestive heart failure ([place])   . Hematuria 01/30/2017  . Alcoholic cirrhosis of liver with ascites ([place])   . Typical atrial flutter ([place])   . CKD (chronic kidney disease) stage 3, GFR 30-59 ml/min (HCC) 05/27/2015  . At risk for decreased bone density 03/04/2015  . Bradycardia 07/08/2013  . Asthma 01/29/2010  . NICOTINE ADDICTION 04/30/2007  . Gout 07/13/2006  . HYPERTENSION, BENIGN SYSTEMIC 07/13/2006     Objective:  BP (!) 130/58   Pulse (!) 49   Temp 98.4 F (36.9 C) (Oral)   Ht 5\' 10"  (1.778 m)   Wt 182 lb 12.8 oz (82.9 kg)   SpO2 97%   BMI 26.23 kg/m  Vitals and nursing note reviewed  General: NAD, pleasant, frail, elderly Cardiac: RRR, normal heart sounds, no murmurs Respiratory: CTAB, normal  effort Abdomen: soft, nontender, mildly distended Extremities: 3+ pitting edema in lower extremities to knees bilaterally Skin: warm and dry, no rashes noted Neuro: alert and oriented, no focal deficits Psych: normal affect   Assessment & Plan:    Congestive heart failure (HCC) Patient with AK I at previous visit and was decreased from Lasix 60 mg twice daily to Lasix 60 mg daily.  On recheck today in office patient has gained 18 pounds and reports he is still going to the bathroom a lot but he feels as if he is having less output.    Given 80 mg of Lasix IM in office today.  Patient instructed to return tomorrow after his BMP has resulted.  Patient also instructed not to take his spironolactone tonight or tomorrow to avoid hyperkalemia.  Patient adamant about not wanting to be hospitalized.  No crackles on lung exam and patient denies dyspnea, chest pain, or worsening orthopnea.   We will reevaluate tomorrow morning and may consider adding metolazone to his regular diuretic regimen.   CKD, Stage IIIb Patient followed by nephrology for CKD stage III and was adamant about not wanting dialysis at that time.  Ensure patient has follow-up with nephrologist.  Bradycardia: HR 49 in clinic today. Asymptomatic. Per last cardiology note, he is to remain on bisoprolol unless he is symptomatic or HR in 30's. Patient taking 2.5 mg daily.  [place] Isobella Ascher, DO Family Medicine Resident PGY-1

## 2017-08-01 NOTE — Addendum Note (Signed)
Addended by: Londell Moh T on: 08/01/2017 05:18 PM   Modules accepted: Orders

## 2017-08-01 NOTE — Assessment & Plan Note (Signed)
Patient with AK I at previous visit and was decreased from Lasix 60 mg twice daily to Lasix 60 mg daily.  On recheck today in office patient has gained 18 pounds and reports he is still going to the bathroom a lot but he feels as if he is having less output.    Given 80 mg of Lasix IM in office today.  Patient instructed to return tomorrow after his BMP has resulted.  Patient also instructed not to take his spironolactone tonight or tomorrow to avoid hyperkalemia.  Patient adamant about not wanting to be hospitalized.  No crackles on lung exam and patient denies dyspnea, chest pain, or worsening orthopnea.   We will reevaluate tomorrow morning and may consider adding metolazone to his regular diuretic regimen.

## 2017-08-02 ENCOUNTER — Telehealth: Payer: Self-pay | Admitting: Family Medicine

## 2017-08-02 ENCOUNTER — Ambulatory Visit: Payer: Medicare Other | Admitting: Family Medicine

## 2017-08-02 VITALS — BP 138/74 | HR 48 | Temp 98.0°F | Wt 179.8 lb

## 2017-08-02 DIAGNOSIS — I5022 Chronic systolic (congestive) heart failure: Secondary | ICD-10-CM

## 2017-08-02 NOTE — Progress Notes (Signed)
Pt here for weight check after OV with Dr. Enid Derry yesterday. Pt's weight yesterday was 182lb. Todays weight 179.8lb. Pt states he is voiding frequently, and a greater output following IM Lasix. BP today 138/74 Pulse-48 SPO2-97% Temp: 98.0 Pt states he is feeling well and denies any SHOB. Wallace Cullens, RN

## 2017-08-02 NOTE — Telephone Encounter (Signed)
Called patient and left voicemail on answering machine letting him know that he has a follow-up appointment on 8/21 at 8:50 AM.  Patient needs to be seen by Dr. as well as have his weight checked.  Patient was also instructed to continue his spironolactone 25 mg today after his BMP showed normal potassium.  Patient instructed to call the clinic if he had any questions at all.

## 2017-08-03 ENCOUNTER — Ambulatory Visit (INDEPENDENT_AMBULATORY_CARE_PROVIDER_SITE_OTHER): Payer: Medicare Other | Admitting: Internal Medicine

## 2017-08-03 VITALS — BP 158/80 | HR 46 | Temp 98.0°F | Wt 177.2 lb

## 2017-08-03 DIAGNOSIS — I5022 Chronic systolic (congestive) heart failure: Secondary | ICD-10-CM | POA: Diagnosis not present

## 2017-08-03 NOTE — Progress Notes (Signed)
   [facility] Kerrin Mo, MD Phone: [phone]  Reason For Visit: Follow up CHF   # CHF exacerbation  - Patient has been taking 1 and 1/2 pills twice daily of the 40 mg of lasix or 20 mg + 40 mg of lasix. Patient has been taking 60 mg once daily of his lasix. Patient was seen on  3/19 with and received a shot of IM lasix 80 mg as he had gained 18 lbs. Patient was initially told to hold spiralactone, however has since been told to restart it.  Patient states that he has no more swelling in his legs. Patient deneis any problems with breathing, PND. Patient use 2 pillows, denies any need for further changes in this. Denies any chest pain, palpations. States overall he feels pretty good    Past Medical History Reviewed problem list.  Medications- reviewed and updated No additions to family history Social history- patient is a non- smoker  Objective: BP (!) 158/80 (BP Location: Left Arm)   Pulse (!) 46   Temp 98 F (36.7 C) (Oral)   Wt 177 lb 3.2 oz (80.4 kg)   BMI 25.43 kg/m  Gen: NAD, alert, cooperative with exam HEENT: Normal, JVD+ Cardio: Systolic murmur noted, regular rate and rhythm, S1S2 heard Pulm: clear to auscultation bilaterally, no wheezes, rhonchi or rales GI: No distention or tightness Extremities: Bilateral 2+ pitting edema noted above knee, though patient states it is much better   Assessment/Plan: See problem based a/p  Congestive heart failure (HCC) No clear documentation of dry weight thought it is obvious today that patient is hypervolemic on exam.  Weight has been decreasing since increasing diuresis 182 > 179> 177 lbs.  - Will have patient increase lasix to 60 mg BID from once daily  - Continue Spirolactone 25 mg  - Obtain BMET and BNP  - Follow up on Monday to see if we are nearing patient's dry weight

## 2017-08-03 NOTE — Patient Instructions (Addendum)
I want you to increase your lasix to 60 mg twice daily. Please continue the spirolactone 25 mg as discussed. Please make an appointment on Monday morning.

## 2017-08-04 ENCOUNTER — Encounter: Payer: Self-pay | Admitting: Internal Medicine

## 2017-08-04 LAB — BASIC METABOLIC PANEL
BUN/Creatinine Ratio: 13 (ref 10–24)
BUN: 26 mg/dL (ref 8–27)
CHLORIDE: 105 mmol/L (ref 96–106)
CO2: 22 mmol/L (ref 20–29)
Calcium: 10.3 mg/dL — ABNORMAL HIGH (ref 8.6–10.2)
Creatinine, Ser: 1.97 mg/dL — ABNORMAL HIGH (ref 0.76–1.27)
GFR calc Af Amer: 37 mL/min/{1.73_m2} — ABNORMAL LOW (ref >59)
GFR calc non Af Amer: 32 mL/min/{1.73_m2} — ABNORMAL LOW (ref >59)
GLUCOSE: 89 mg/dL (ref 65–99)
POTASSIUM: 4.7 mmol/L (ref 3.5–5.2)
Sodium: 142 mmol/L (ref 134–144)

## 2017-08-04 LAB — BRAIN NATRIURETIC PEPTIDE: BNP: 792.1 pg/mL — AB (ref 0.0–100.0)

## 2017-08-04 NOTE — Assessment & Plan Note (Addendum)
No clear documentation of dry weight thought it is obvious today that patient is hypervolemic on exam.  Weight has been decreasing since increasing diuresis 182 > 179> 177 lbs.  - Will have patient increase lasix to 60 mg BID from once daily  - Continue Spirolactone 25 mg  - Obtain BMET and BNP  - Follow up on Monday to see if we are nearing patient's dry weight

## 2017-08-07 ENCOUNTER — Ambulatory Visit (INDEPENDENT_AMBULATORY_CARE_PROVIDER_SITE_OTHER): Payer: Medicare Other | Admitting: Family Medicine

## 2017-08-07 ENCOUNTER — Encounter: Payer: Self-pay | Admitting: Family Medicine

## 2017-08-07 ENCOUNTER — Other Ambulatory Visit: Payer: Self-pay

## 2017-08-07 DIAGNOSIS — I5022 Chronic systolic (congestive) heart failure: Secondary | ICD-10-CM

## 2017-08-07 NOTE — Assessment & Plan Note (Addendum)
Patient presents today to follow-up on recent CHF exacerbation.  Weight is down 4 pounds.  Patient is currently weighting 173 lbs from 177lbs.  Still some signs of fluid overload with trace pitting edema and crackles at the bases.  Approaching dry weight of 168 pounds.  BNP at last office visit was elevated at 792 and creatinine was still elevated but improved from last BMP. --Recommend continuing current regimen of IV Lasix 60 mg twice daily --Continue spironolactone 25 mg daily --We will hold off on labs today --Scheduled for a follow-up visit on 3/29.

## 2017-08-07 NOTE — Patient Instructions (Signed)
It was great seeing you today! We have addressed the following issues today  1. Please continue with lasix 60 mg two times a day and spironolactone 25 mg. Your weight is down to 173 from 177. Our goal weight is around 168 lbs, so we are close to it. 2. We made you an appointment for Friday to follow up here in clinic.  If we did any lab work today, and the results require attention, either me or my nurse will get in touch with you. If everything is normal, you will get a letter in mail and a message via . If you don't hear from [place] in two weeks, please give [place] a call. Otherwise, we look forward to seeing you again at your next visit. If you have any questions or concerns before then, please call the clinic at [phone].  Please bring all your medications to every doctors visit  Sign up for My Chart to have easy access to your labs results, and communication with your Primary care physician. Please ask Front Desk for some assistance.   Please check-out at the front desk before leaving the clinic.    Take Care,   Dr. Andy Gauss

## 2017-08-07 NOTE — Progress Notes (Signed)
Subjective:    Patient ID: Donald Moreno, male    DOB: April 03, 1941, 77 y.o.   MRN: [id number]   CC: Follow up for CHF  HPI: Patient is a 77 year old male who is here today to follow-up on recent CHF exacerbation.  Patient reports that his been doing well since last office visit.  Patient states that the swelling in his lower extremity has improved.  Patient is currently taking 60 mg IV Lasix and spironolactone 25 mg daily.  Patient denies any shortness of breath, orthopnea, chest pain.  Smoking status reviewed   ROS: all other systems were reviewed and are negative other than in the HPI   Past Medical History:  Diagnosis Date  . Acute congestive heart failure ([place])   . Arthritis    "all over"  . Asthma   . Blood in stool 04/11/2017  . Chronic kidney disease    "I see a kidney dr @ [facility]" (10/11/2016)  . Chronic lower back pain    "turned a truck over a long time ago" (10/11/2016)  . Dysrhythmia    bradycardia with occassional junctional rhythm  . ETOH abuse   . Gout   . Hypertension   . Inguinal hernia 10/28/2014   S/P inguinal hernia repair with 4L as ascites removed 03/30/17  . Liver cirrhosis ([place]) 2015  . Macrocytosis   . Pneumonia    "long long time ago" (10/11/2016)  . Spontaneous bacterial peritonitis ([place]) 07/01/2015    Past Surgical History:  Procedure Laterality Date  . INGUINAL HERNIA REPAIR Bilateral 03/30/2017   Procedure: OPEN BILATERAL INGUINAL HERNIA REPAIR WITH MESH;  Surgeon: Kinsinger, Arta Bruce, MD;  Location: [facility];  Service: General;  Laterality: Bilateral;  GENERAL COMBINED WITH REGIONAL FOR POST OP PAIN   . INSERTION OF MESH N/A 10/16/2016   Procedure: INSERTION OF MESH;  Surgeon: Kinsinger, Arta Bruce, MD;  Location: [place];  Service: General;  Laterality: N/A;  . INSERTION OF MESH Bilateral 03/30/2017   Procedure: INSERTION OF MESH;  Surgeon: Kieth Brightly Arta Bruce, MD;  Location: [facility];  Service: General;  Laterality: Bilateral;  GENERAL  COMBINED WITH REGIONAL FOR POST OP PAIN   . IR PARACENTESIS  08/19/2016  . IR PARACENTESIS  08/26/2016  . IR PARACENTESIS  09/02/2016  . IR PARACENTESIS  09/09/2016  . IR PARACENTESIS  09/16/2016  . IR PARACENTESIS  10/03/2016  . IR PARACENTESIS  10/11/2016  . IR RADIOLOGIST EVAL & MGMT  09/29/2016  . IR RADIOLOGIST EVAL & MGMT  12/21/2016  . IR TIPS  10/11/2016  . RADIOLOGY WITH ANESTHESIA N/A 10/11/2016   Procedure: TIPS;  Surgeon: Arne Cleveland, MD;  Location: [place];  Service: Radiology;  Laterality: N/A;  . UMBILICAL HERNIA REPAIR N/A 10/16/2016   Procedure: HERNIA REPAIR UMBILICAL ADULT;  Surgeon: Kinsinger, Arta Bruce, MD;  Location: [place];  Service: General;  Laterality: N/A;    Past medical history, surgical, family, and social history reviewed and updated in the EMR as appropriate.  Objective:  BP 120/68   Pulse (!) 42   Temp 98.2 F (36.8 C) (Oral)   Ht 5\' 10"  (1.778 m)   Wt 173 lb (78.5 kg)   SpO2 98%   BMI 24.82 kg/m   Vitals and nursing note reviewed  General: NAD, pleasant, able to participate in exam Cardiac: RRR, normal heart sounds, no murmurs. 2+ radial and PT pulses bilaterally Respiratory: Mild crackles noted at the bases bilaterally otherwise moving air clearly in upper lung fields  Abdomen: soft, nontender, nondistended, no hepatic or splenomegaly, +BS Extremities: Trace pitting edema bilaterally lower extremity Skin: warm and dry, no rashes noted Neuro: alert and oriented x4, no focal deficits Psych: Normal affect and mood   Assessment & Plan:   Congestive heart failure [facility]) Patient presents today to follow-up on recent CHF exacerbation.  Weight is down 4 pounds.  Patient is currently weighting 173 lbs from 177lbs.  Still some signs of fluid overload with trace pitting edema and crackles at the bases.  Approaching dry weight of 168 pounds.  BNP at last office visit was elevated at 792 and creatinine was still elevated but improved from last BMP. --Recommend  continuing current regimen of IV Lasix 60 mg twice daily --Continue spironolactone 25 mg daily --We will hold off on labs today --Scheduled for a follow-up visit on 3/29.    Marjie Skiff, MD [place] PGY-2

## 2017-08-11 ENCOUNTER — Other Ambulatory Visit: Payer: Self-pay

## 2017-08-11 ENCOUNTER — Ambulatory Visit (INDEPENDENT_AMBULATORY_CARE_PROVIDER_SITE_OTHER): Payer: Medicare Other | Admitting: Student in an Organized Health Care Education/Training Program

## 2017-08-11 ENCOUNTER — Encounter: Payer: Self-pay | Admitting: Student in an Organized Health Care Education/Training Program

## 2017-08-11 DIAGNOSIS — I509 Heart failure, unspecified: Secondary | ICD-10-CM

## 2017-08-11 DIAGNOSIS — I5022 Chronic systolic (congestive) heart failure: Secondary | ICD-10-CM

## 2017-08-11 MED ORDER — FLUTICASONE-SALMETEROL 500-50 MCG/DOSE IN AEPB: INHALATION_SPRAY | RESPIRATORY_TRACT | 1 refills | Status: DC

## 2017-08-11 MED ORDER — FUROSEMIDE 40 MG PO TABS: 60.0000 mg | ORAL_TABLET | Freq: Three times a day (TID) | ORAL | 0 refills | Status: DC

## 2017-08-11 NOTE — Assessment & Plan Note (Addendum)
Weight stable at 174 from 173 lbs when he was seen 5 days ago.  Dry weight ~165. Patient feeling well, though still with +JVD and trace edema. - patient has scale at home. Plan to do weight-based lasix dosing daily. He is agreeable to this plan. - if weight >168lbs, take lasix TID - if weight 163-168 lbs, take lasix BID - if weight <163 lbs, take lasix once daily - follow up in one month if this plan is working, or follow up in 2 weeks if he is having trouble managing daily weight-based dosing. Patient to call us right away if he feels volume overload or weights are worsening each day - will not recheck blood work today - teach back method multiple multiple times in the office and patient is able to speak back weight-based lasix plan very clearly and expresses good understanding. Plan also listed on AVS and went over that with patient again.

## 2017-08-11 NOTE — Patient Instructions (Signed)
It was a pleasure seeing you today in our clinic. Today we discussed your heart failure. Here is the treatment plan we have discussed and agreed upon together:  Action plan: - If your weight is MORE than 168 lbs on your scale, take your 60 mg of lasix THREE times day  - If your weight LESS than 168 lbs, take your lasix 60 mg TWICE that day  - If your weight is LESS than 163 lbs, take your lasix 60 mg ONCE that day   Continue taking spironolactone daily.  Our clinic's number is [phone]. Please call with questions or concerns about what we discussed today.  Be well, Dr. Burr Medico

## 2017-08-11 NOTE — Progress Notes (Signed)
Subjective:    Donald Moreno - 77 y.o. male MRN [id number]  Date of birth: [birth year]  HPI  Donald Moreno is here for follow up CHF exacerbation..   CHF exacerbation HFpEF, follow up  - Last EF 60-65% on 04/12/2017  Patient was previously seen on 3/25 for follow up after a recent CHF exacerbation. He has been taking 60 mg Lasix BID and spironolactone 25 mg daily, with improvement in swelling, weight. Medications were continued at that time and he was scheduled follow up.  Going back to his hospitalization in December, it appears he was discharged on 60 mg lasix BID and spironolactone, so this has been his medication regimen for a while.  He reports today that he has been urinating with the 60 mg lasix daily. He has not had any respiratory symptoms. He reports that previously he was having swelling in his legs up to his thighs and they could see fluid in his neck. Uses 2 pillows to sleep at night which is baseline for him.  Swelling in his legs has improved. He feels he still has some fluid on but overall feels very well, and asks if it is possible to space out his follow up appointments because he has been seen so frequently for CHF follow up.  Weights: 182>179>173>174 lbs over previous visits and today. Dry weight in chart appears to be ~165 lbs.   Baseline creatinine appears to be ~1.9 and he was at baseline when last checked on 3/21.  Health Maintenance:  There are no preventive care reminders to display for this patient.  -  reports that he has never smoked. His smokeless tobacco use includes snuff and chew. - Review of Systems: Per HPI. - Past Medical History: Patient Active Problem List   Diagnosis Date Noted  . Corn or callus 07/11/2017  . Macrocytic anemia 04/20/2017  . Cirrhosis of liver with ascites ([place])   . Congestive heart failure ([place])   . Hematuria 01/30/2017  . Alcoholic cirrhosis of liver with ascites ([place])   . Typical atrial flutter ([place])   . CKD (chronic  kidney disease) stage 3, GFR 30-59 ml/min (HCC) 05/27/2015  . At risk for decreased bone density 03/04/2015  . Bradycardia 07/08/2013  . Asthma 01/29/2010  . Gout 07/13/2006  . HYPERTENSION, BENIGN SYSTEMIC 07/13/2006   - Medications: reviewed and updated Current Outpatient Medications  Medication Sig Dispense Refill  . allopurinol (ZYLOPRIM) 300 MG tablet Take 1 tablet (300 mg total) by mouth daily. 90 tablet 1  . bisoprolol (ZEBETA) 5 MG tablet Take 0.5 tablets (2.5 mg total) by mouth daily. 90 tablet 0  . Cholecalciferol (VITAMIN D-3) 5000 units TABS Take 5,000 Units daily by mouth.     . ferrous sulfate 325 (65 FE) MG tablet Take 1 tablet (325 mg total) by mouth 2 (two) times daily with a meal. 60 tablet 3  . Fluticasone-Salmeterol (ADVAIR DISKUS) 500-50 MCG/DOSE AEPB INHALE 1 PUFF TWO TIMES  DAILY 180 each 1  . furosemide (LASIX) 40 MG tablet Take 1.5 tablets (60 mg total) by mouth 3 (three) times daily. Take 60 mg 1-3 times daily based on daily weight measurements. 90 tablet 0  . lactulose (CHRONULAC) 10 GM/15ML solution Take 15 mLs (10 g total) by mouth 2 (two) times daily as needed (need to ensure 2 soft BM in a day.). 1892 mL 0  . montelukast (SINGULAIR) 10 MG tablet Take 1 tablet (10 mg total) by mouth at bedtime. 90 tablet 1  . Multiple  Vitamin (MULTIVITAMIN WITH MINERALS) TABS tablet Take 1 tablet by mouth daily. 30 tablet 0  . Nutritional Supplements (ENSURE COMPLETE SHAKE) LIQD Take 1 Container by mouth daily. (Patient taking differently: Take 1 Container 2 (two) times daily by mouth. ) 237 mL 11  . PROAIR HFA 108 (90 Base) MCG/ACT inhaler Use 2 puffs every 4 hours  as needed for wheezing or  shortness of breath 51 g 3  . spironolactone (ALDACTONE) 25 MG tablet Take 1 tablet (25 mg total) by mouth daily. 90 tablet 0  . spironolactone (ALDACTONE) 25 MG tablet TAKE 1 TABLET BY MOUTH EVERY DAY 60 tablet 0   No current facility-administered medications for this visit.     Review  of Systems See HPI     Objective:   Physical Exam BP (!) 120/58   Pulse (!) 52   Temp 98.1 F (36.7 C) (Oral)   Wt 174 lb (78.9 kg)   SpO2 98%   BMI 24.97 kg/m  Gen: NAD, alert, cooperative with exam, well-appearing  HEENT: NCAT CV: bradycardia, regular rhythm, good S1/S2, no murmur. 0-1+edema over shins bilaterally. +JVD Resp: +soft wheezes thoughout, non-labored, no cracles Abd: SNTND Skin: no rashes, normal turgor  Neuro: no gross deficits.  Psych: good insight, alert and oriented  Assessment & Plan:   Congestive heart failure ([place]) Weight stable at 174 from 173 lbs when he was seen 5 days ago.  Dry weight ~165. Patient feeling well, though still with +JVD and trace edema. - patient has scale at home. Plan to do weight-based lasix dosing daily. He is agreeable to this plan. - if weight >168lbs, take lasix TID - if weight 163-168 lbs, take lasix BID - if weight <163 lbs, take lasix once daily - follow up in one month if this plan is working, or follow up in 2 weeks if he is having trouble managing daily weight-based dosing. Patient to call us right away if he feels volume overload or weights are worsening each day - will not recheck blood work today - teach back method multiple multiple times in the office and patient is able to speak back weight-based lasix plan very clearly and expresses good understanding. Plan also listed on AVS and went over that with patient again.   Meds ordered this encounter  Medications  . Fluticasone-Salmeterol (ADVAIR DISKUS) 500-50 MCG/DOSE AEPB    Sig: INHALE 1 PUFF TWO TIMES  DAILY    Dispense:  180 each    Refill:  1  . furosemide (LASIX) 40 MG tablet    Sig: Take 1.5 tablets (60 mg total) by mouth 3 (three) times daily. Take 60 mg 1-3 times daily based on daily weight measurements.    Dispense:  90 tablet    Refill:  0    Everrett Coombe, MD,MS,  PGY2 08/11/2017 11:03 AM

## 2017-08-17 ENCOUNTER — Other Ambulatory Visit: Payer: Self-pay | Admitting: Gastroenterology

## 2017-08-17 DIAGNOSIS — R188 Other ascites: Secondary | ICD-10-CM | POA: Diagnosis not present

## 2017-08-17 DIAGNOSIS — K7460 Unspecified cirrhosis of liver: Secondary | ICD-10-CM | POA: Diagnosis not present

## 2017-08-22 ENCOUNTER — Telehealth: Payer: Self-pay | Admitting: Cardiology

## 2017-08-22 NOTE — Telephone Encounter (Signed)
New Message    1) Are you calling to confirm a diagnosis or obtain personal health information (Y/N)? Y   2) If so, what information is requested? Erasmo Downer with [facility] is calling to obtain patients last Ejection Fraction. Please call to discuss.   Please route to Medical Records or your medical records site representative

## 2017-08-23 DIAGNOSIS — H25813 Combined forms of age-related cataract, bilateral: Secondary | ICD-10-CM | POA: Diagnosis not present

## 2017-08-23 DIAGNOSIS — H40013 Open angle with borderline findings, low risk, bilateral: Secondary | ICD-10-CM | POA: Diagnosis not present

## 2017-08-28 ENCOUNTER — Other Ambulatory Visit: Payer: Self-pay | Admitting: Family Medicine

## 2017-08-31 ENCOUNTER — Ambulatory Visit
Admission: RE | Admit: 2017-08-31 | Discharge: 2017-08-31 | Disposition: A | Discharge status: Home / Self Care | Payer: Medicare Other | Source: Ambulatory Visit | Attending: Gastroenterology | Admitting: Gastroenterology

## 2017-08-31 DIAGNOSIS — K7460 Unspecified cirrhosis of liver: Secondary | ICD-10-CM

## 2017-08-31 DIAGNOSIS — R188 Other ascites: Principal | ICD-10-CM

## 2017-09-11 ENCOUNTER — Emergency Department (HOSPITAL_COMMUNITY)
Admission: EM | Admit: 2017-09-11 | Discharge: 2017-09-12 | Disposition: A | Discharge status: Home / Self Care | Payer: Medicare Other | Attending: Emergency Medicine | Admitting: Emergency Medicine

## 2017-09-11 ENCOUNTER — Telehealth: Payer: Self-pay

## 2017-09-11 ENCOUNTER — Emergency Department (HOSPITAL_COMMUNITY): Payer: Medicare Other

## 2017-09-11 ENCOUNTER — Encounter: Payer: Self-pay | Admitting: Family Medicine

## 2017-09-11 ENCOUNTER — Encounter (HOSPITAL_COMMUNITY): Payer: Self-pay | Admitting: *Deleted

## 2017-09-11 ENCOUNTER — Ambulatory Visit (INDEPENDENT_AMBULATORY_CARE_PROVIDER_SITE_OTHER): Payer: Medicare Other | Admitting: Family Medicine

## 2017-09-11 ENCOUNTER — Other Ambulatory Visit: Payer: Self-pay

## 2017-09-11 VITALS — BP 130/68 | HR 111 | Temp 98.1°F | Ht 69.0 in | Wt 158.0 lb

## 2017-09-11 DIAGNOSIS — F17220 Nicotine dependence, chewing tobacco, uncomplicated: Secondary | ICD-10-CM | POA: Diagnosis not present

## 2017-09-11 DIAGNOSIS — Y999 Unspecified external cause status: Secondary | ICD-10-CM | POA: Insufficient documentation

## 2017-09-11 DIAGNOSIS — N183 Chronic kidney disease, stage 3 (moderate): Secondary | ICD-10-CM | POA: Diagnosis not present

## 2017-09-11 DIAGNOSIS — S62101A Fracture of unspecified carpal bone, right wrist, initial encounter for closed fracture: Secondary | ICD-10-CM

## 2017-09-11 DIAGNOSIS — Z79899 Other long term (current) drug therapy: Secondary | ICD-10-CM | POA: Diagnosis not present

## 2017-09-11 DIAGNOSIS — J45909 Unspecified asthma, uncomplicated: Secondary | ICD-10-CM | POA: Insufficient documentation

## 2017-09-11 DIAGNOSIS — M25531 Pain in right wrist: Secondary | ICD-10-CM | POA: Diagnosis not present

## 2017-09-11 DIAGNOSIS — W11XXXA Fall on and from ladder, initial encounter: Secondary | ICD-10-CM | POA: Diagnosis not present

## 2017-09-11 DIAGNOSIS — I509 Heart failure, unspecified: Secondary | ICD-10-CM

## 2017-09-11 DIAGNOSIS — Y92007 Garden or yard of unspecified non-institutional (private) residence as the place of occurrence of the external cause: Secondary | ICD-10-CM | POA: Insufficient documentation

## 2017-09-11 DIAGNOSIS — I129 Hypertensive chronic kidney disease with stage 1 through stage 4 chronic kidney disease, or unspecified chronic kidney disease: Secondary | ICD-10-CM | POA: Diagnosis not present

## 2017-09-11 DIAGNOSIS — M79641 Pain in right hand: Secondary | ICD-10-CM | POA: Diagnosis not present

## 2017-09-11 DIAGNOSIS — M1990 Unspecified osteoarthritis, unspecified site: Secondary | ICD-10-CM

## 2017-09-11 DIAGNOSIS — S6991XA Unspecified injury of right wrist, hand and finger(s), initial encounter: Secondary | ICD-10-CM | POA: Diagnosis not present

## 2017-09-11 DIAGNOSIS — Y93H2 Activity, gardening and landscaping: Secondary | ICD-10-CM | POA: Insufficient documentation

## 2017-09-11 DIAGNOSIS — S6291XA Unspecified fracture of right wrist and hand, initial encounter for closed fracture: Secondary | ICD-10-CM | POA: Diagnosis not present

## 2017-09-11 MED ORDER — IBUPROFEN 400 MG PO TABS
400.0000 mg | ORAL_TABLET | Freq: Once | ORAL | Status: AC | PRN
  Administered 2017-09-11: 400 mg via ORAL
  Filled 2017-09-11: qty 1

## 2017-09-11 MED ORDER — DICLOFENAC SODIUM 1 % TD GEL: 2.0000 g | Freq: Four times a day (QID) | TRANSDERMAL | 2 refills | Status: DC

## 2017-09-11 NOTE — Progress Notes (Signed)
   Subjective:    Patient ID: Donald Moreno, male    DOB: November 17, 1940, 77 y.o.   MRN: [id number]   CC:  HPI:  Arthritis: - pain in joints of wrist are worst - previously has taken tylenol, but with history of cirrhosis told to stop - getting harder for him to walk as much due to his joint pan in his knees as well - wold like something to help with the pain on days when it is worse - currently not using anything for pain  CHF: - last seen in the office on 3/29 and given weight based plan - patient has been following, but did not lower to once daily lasix as he wanted to see me first - he weighed 157.6 this morning on his home scale. His instruction were to take once daily lasix 60mg  if weight was less than 163lb - patient reports still having normal urine output, states he goes all the time and this is normal - also states that he is feeling great and walking everyday like he usually does - ROS: denies scrotal swelling, uses 2 pillows at night which is his normal, and denies any leg swelling  Smoking status reviewed  Review of Systems Per HPI   Patient Active Problem List   Diagnosis Date Noted  . Corn or callus 07/11/2017  . Macrocytic anemia 04/20/2017  . Cirrhosis of liver with ascites ([place])   . Congestive heart failure ([place])   . Hematuria 01/30/2017  . Alcoholic cirrhosis of liver with ascites ([place])   . Typical atrial flutter ([place])   . CKD (chronic kidney disease) stage 3, GFR 30-59 ml/min (H[facility]) 05/27/2015  . At risk for decreased bone density 03/04/2015  . Bradycardia 07/08/2013  . Asthma 01/29/2010  . Gout 07/13/2006  . HYPERTENSION, BENIGN SYSTEMIC 07/13/2006  . Arthritis 07/13/2006     Objective:  BP 130/68   Pulse (!) 111   Temp 98.1 F (36.7 C) (Oral)   Ht 5\' 9"  (1.753 m)   Wt 158 lb (71.7 kg)   SpO2 98%   BMI 23.33 kg/m  Vitals and nursing note reviewed  General: NAD, pleasant Cardiac: RRR, normal heart sounds, no murmurs Respiratory: CTAB, normal  effort Extremities: no edema or cyanosis. WWP. Skin: warm and dry, no rashes noted, tenting on exam Neuro: alert and oriented, no focal deficits Psych: normal affect   Assessment & Plan:    Congestive heart failure ([place]) Patient instructed to hold all Lasix today given how dry he appears on exam. Attempted to call and told him to restart once daily Lasix and return to clinic on 5/06 for further education on lasix use and his fluids status. Left patient voicemail and instructed hi to call the office back with any questions.   BMP today with Cr bump to 2.29 in likely setting of over-diuresing.   Plan to continue with weight based lasix, but ensure that patient understands. - if weight >168lbs, take lasix TID - if weight 163-168 lbs, take lasix BID - if weight <163 lbs, take lasix once daily   Arthritis Patient no longer taking mobic due to worsening heart disease. Tylenol no longer helping patient and not taking in setting of cirrhosis. Will attempt for PA of Voltaren gel for patient's worsening wrist pain from arthritis.     [place] Tamee Battin, DO Family Medicine Resident PGY-1

## 2017-09-11 NOTE — ED Notes (Signed)
Ortho tech paged for splint.

## 2017-09-11 NOTE — Telephone Encounter (Signed)
Received PA from [facility] for diclofenac gel. Questions from cover my meds printed and placed in MD box to fill out to submit online. Wallace Cullens, RN

## 2017-09-11 NOTE — ED Triage Notes (Signed)
Pt reports falling off 3rd step of a ladder. Did not his head, no loc. Only complaint is right wrist/hand pain.

## 2017-09-11 NOTE — Patient Instructions (Addendum)
Thank you for coming to see me today. It was a pleasure! Today we talked about:   Your weight. Please hold your lasix (furosemide) tomorrow. I will call you with your lab results and let you know how much lasix to restart at that time.   Please follow-up with me in 1 month or sooner as needed.  If you have any questions or concerns, please do not hesitate to call the office at [phone].  Take Care,   [place] Kevina Piloto, DO

## 2017-09-11 NOTE — ED Provider Notes (Signed)
[facility] EMERGENCY DEPARTMENT Provider Note   CSN: [id number] Arrival date & time: 09/11/17  1911     History   Chief Complaint Chief Complaint  Patient presents with  . Fall  . Arm Pain    HPI Donald Moreno is a 77 y.o. male.  Patient fell about 2 feet from step ladder while attempting to prune a tree, landing on an outstretched right hand. He has swelling to the right wrist with mild tenderness.   The history is provided by the patient. No language interpreter was used.  Wrist Pain  This is a new problem. The current episode started 6 to 12 hours ago.    Past Medical History:  Diagnosis Date  . Acute congestive heart failure ([place])   . Arthritis    "all over"  . Asthma   . Blood in stool 04/11/2017  . Chronic kidney disease    "I see a kidney dr @ [facility]" (10/11/2016)  . Chronic lower back pain    "turned a truck over a long time ago" (10/11/2016)  . Dysrhythmia    bradycardia with occassional junctional rhythm  . ETOH abuse   . Gout   . Hypertension   . Inguinal hernia 10/28/2014   S/P inguinal hernia repair with 4L as ascites removed 03/30/17  . Liver cirrhosis ([place]) 2015  . Macrocytosis   . Pneumonia    "long long time ago" (10/11/2016)  . Spontaneous bacterial peritonitis ([place]) 07/01/2015    Patient Active Problem List   Diagnosis Date Noted  . Corn or callus 07/11/2017  . Macrocytic anemia 04/20/2017  . Cirrhosis of liver with ascites ([place])   . Congestive heart failure ([place])   . Hematuria 01/30/2017  . Alcoholic cirrhosis of liver with ascites ([place])   . Typical atrial flutter ([place])   . CKD (chronic kidney disease) stage 3, GFR 30-59 ml/min (HCC) 05/27/2015  . At risk for decreased bone density 03/04/2015  . Bradycardia 07/08/2013  . Asthma 01/29/2010  . Gout 07/13/2006  . HYPERTENSION, BENIGN SYSTEMIC 07/13/2006    Past Surgical History:  Procedure Laterality Date  . INGUINAL HERNIA REPAIR Bilateral 03/30/2017   Procedure: OPEN BILATERAL INGUINAL HERNIA REPAIR WITH MESH;  Surgeon: Kinsinger, Arta Bruce, MD;  Location: [facility];  Service: General;  Laterality: Bilateral;  GENERAL COMBINED WITH REGIONAL FOR POST OP PAIN   . INSERTION OF MESH N/A 10/16/2016   Procedure: INSERTION OF MESH;  Surgeon: Kinsinger, Arta Bruce, MD;  Location: [facility];  Service: General;  Laterality: N/A;  . INSERTION OF MESH Bilateral 03/30/2017   Procedure: INSERTION OF MESH;  Surgeon: Kieth Brightly Arta Bruce, MD;  Location: [facility];  Service: General;  Laterality: Bilateral;  GENERAL COMBINED WITH REGIONAL FOR POST OP PAIN   . IR PARACENTESIS  08/19/2016  . IR PARACENTESIS  08/26/2016  . IR PARACENTESIS  09/02/2016  . IR PARACENTESIS  09/09/2016  . IR PARACENTESIS  09/16/2016  . IR PARACENTESIS  10/03/2016  . IR PARACENTESIS  10/11/2016  . IR RADIOLOGIST EVAL & MGMT  09/29/2016  . IR RADIOLOGIST EVAL & MGMT  12/21/2016  . IR TIPS  10/11/2016  . RADIOLOGY WITH ANESTHESIA N/A 10/11/2016   Procedure: TIPS;  Surgeon: Arne Cleveland, MD;  Location: [facility];  Service: Radiology;  Laterality: N/A;  . UMBILICAL HERNIA REPAIR N/A 10/16/2016   Procedure: HERNIA REPAIR UMBILICAL ADULT;  Surgeon: Kinsinger, Arta Bruce, MD;  Location: [facility];  Service: General;  Laterality: N/A;  Home Medications    Prior to Admission medications   Medication Sig Start Date End Date Taking? Authorizing Provider  allopurinol (ZYLOPRIM) 300 MG tablet Take 1 tablet (300 mg total) by mouth daily. 07/11/17   Shirley, [place], DO  bisoprolol (ZEBETA) 5 MG tablet Take 0.5 tablets (2.5 mg total) by mouth daily. 05/10/17   Mayo, Pete Pelt, MD  Cholecalciferol (VITAMIN D-3) 5000 units TABS Take 5,000 Units daily by mouth.     [provider]  diclofenac sodium (VOLTAREN) 1 % GEL Apply 2 g topically 4 (four) times daily. 09/11/17   Shirley, [place], DO  ferrous sulfate 325 (65 FE) MG tablet Take 1 tablet (325 mg total) by mouth 2 (two) times daily with a meal. 10/21/14    Gottschalk, Ashly M, DO  Fluticasone-Salmeterol (ADVAIR DISKUS) 500-50 MCG/DOSE AEPB INHALE 1 PUFF TWO TIMES  DAILY 08/11/17   Everrett Coombe, MD  furosemide (LASIX) 40 MG tablet Take 1.5 tablets (60 mg total) by mouth 3 (three) times daily. Take 60 mg 1-3 times daily based on daily weight measurements. 08/11/17   Everrett Coombe, MD  lactulose (CHRONULAC) 10 GM/15ML solution TAKE 15 MLS BY MOUTH 2 (TWO) TIMES DAILY AS NEEDED (NEED TO ENSURE 2 SOFT BM IN A DAY.). 08/29/17   Lockamy, Christia Reading, DO  montelukast (SINGULAIR) 10 MG tablet Take 1 tablet (10 mg total) by mouth at bedtime. 08/01/17   Shirley, [place], DO  Multiple Vitamin (MULTIVITAMIN WITH MINERALS) TABS tablet Take 1 tablet by mouth daily. 04/18/17   Shirley, [place], DO  Nutritional Supplements (ENSURE COMPLETE SHAKE) LIQD Take 1 Container by mouth daily. Patient taking differently: Take 1 Container 2 (two) times daily by mouth.  03/04/15   Katheren Shams, DO  PROAIR HFA 108 [phone] Base) MCG/ACT inhaler Use 2 puffs every 4 hours  as needed for wheezing or  shortness of breath 11/26/15   Haney, Yetta Flock A, MD  spironolactone (ALDACTONE) 25 MG tablet Take 1 tablet (25 mg total) by mouth daily. 06/01/17   Shirley, [place], DO  spironolactone (ALDACTONE) 25 MG tablet TAKE 1 TABLET BY MOUTH EVERY DAY 07/20/17   Shirley, [place], DO    Family History Family History  Problem Relation Age of Onset  . Other Mother        died when pt was only 8 - unknown cause.  . Other Father        deceased.  . Asthma Sister   . Heart disease Sister        multiple stents.  . Asthma Brother   . Hypertension Brother   . Asthma Son   . Asthma Brother   . Hypertension Brother     Social History Social History   Tobacco Use  . Smoking status: Never Smoker  . Smokeless tobacco: Current User    Types: Snuff, Chew  Substance Use Topics  . Alcohol use: No    Comment: quit drinking "over 2 yr ago" (10/11/2016)  . Drug use: No     Allergies   Patient has no known  allergies.   Review of Systems Review of Systems  Musculoskeletal: Positive for arthralgias.  All other systems reviewed and are negative.    Physical Exam Updated Vital Signs BP 118/79 (BP Location: Left Arm)   Pulse (!) 111   Temp 98.1 F (36.7 C) (Oral)   Resp 16   SpO2 100%   Physical Exam  Constitutional: He is oriented to person, place, and time. He appears well-developed and well-nourished. No  distress.  HENT:  Head: Atraumatic.  Eyes: Conjunctivae are normal.  Cardiovascular: Regular rhythm.  Pulmonary/Chest: Effort normal and breath sounds normal.  Abdominal: Soft. He exhibits no distension.  Musculoskeletal: He exhibits edema and tenderness.       Right wrist: He exhibits decreased range of motion, tenderness and swelling.  Able to make fist with fingers, sensation intact.  Neurological: He is alert and oriented to person, place, and time.  Skin: Skin is warm and dry.  Psychiatric: He has a normal mood and affect.  Nursing note and vitals reviewed.    ED Treatments / Results  Labs (all labs ordered are listed, but only abnormal results are displayed) Labs Reviewed - No data to display  EKG None  Radiology Dg Wrist Complete Right  Result Date: 09/11/2017 CLINICAL DATA:  Pain after fall EXAM: RIGHT WRIST - COMPLETE 3+ VIEW COMPARISON:  None. FINDINGS: Vascular calcifications. Subtle fracture through the radial metaphysis. The distal ulna and carpal bones are intact. Dorsal soft tissue swelling is noted. Possible dorsal displacement of the distal ulna versus the remainder of the wrist. Recommend clinical correlation. IMPRESSION: 1. Subtle fracture of the distal radial metaphysis. 2. Suggested mild anterior displacement of the distal ulna relative to the remainder the wrist. Recommend clinical correlation. 3. Dorsal soft tissue swelling. Electronically Signed   By: Dorise Bullion III M.D   On: 09/11/2017 21:50   Dg Hand Complete Right  Result Date:  09/11/2017 CLINICAL DATA:  Pain after trauma EXAM: RIGHT HAND - COMPLETE 3+ VIEW COMPARISON:  None. FINDINGS: Degenerative changes in the fingers. Dorsal displacement the distal ulna relative to the remainder of the wrist. Dorsal soft tissue swelling over the wrist. Distal radius fracture. IMPRESSION: Dorsal displacement of the distal ulna relative to the remainder of the wrist. Dorsal soft tissue swelling. Distal radius fracture. No hand fracture. Electronically Signed   By: Dorise Bullion III M.D   On: 09/11/2017 21:51    Procedures Procedures (including critical care time)  Medications Ordered in ED Medications  ibuprofen (ADVIL,MOTRIN) tablet 400 mg (400 mg Oral Given 09/11/17 2126)     Initial Impression / Assessment and Plan / ED Course  I have reviewed the triage vital signs and the nursing notes.  Pertinent labs & imaging results that were available during my care of the patient were reviewed by me and considered in my medical decision making (see chart for details).     Patient discussed with and seen by Dr. Wilson Singer.  Patient with right wrist fracture.  Pt advised to follow up with orthopedics (hand). Patient placed in volar splint and arm sling while in ED, care instructions provided. Patient will be discharged home & is agreeable with above plan. Returns precautions discussed. Pt appears safe for discharge. Final Clinical Impressions(s) / ED Diagnoses   Final diagnoses:  Closed fracture of right wrist, initial encounter    ED Discharge Orders    None       Etta Quill, NP 09/12/17 2500    Virgel Manifold, MD 09/12/17 1546

## 2017-09-12 ENCOUNTER — Telehealth: Payer: Self-pay | Admitting: Family Medicine

## 2017-09-12 LAB — BASIC METABOLIC PANEL
BUN / CREAT RATIO: 21 (ref 10–24)
BUN: 47 mg/dL — ABNORMAL HIGH (ref 8–27)
CO2: 21 mmol/L (ref 20–29)
CREATININE: 2.29 mg/dL — AB (ref 0.76–1.27)
Calcium: 10.9 mg/dL — ABNORMAL HIGH (ref 8.6–10.2)
Chloride: 101 mmol/L (ref 96–106)
GFR calc Af Amer: 31 mL/min/{1.73_m2} — ABNORMAL LOW (ref >59)
GFR, EST NON AFRICAN AMERICAN: 27 mL/min/{1.73_m2} — AB (ref >59)
GLUCOSE: 100 mg/dL — AB (ref 65–99)
POTASSIUM: 4.8 mmol/L (ref 3.5–5.2)
SODIUM: 139 mmol/L (ref 134–144)

## 2017-09-12 NOTE — Progress Notes (Signed)
Orthopedic Tech Progress Note Patient Details:  Donald Moreno [birth year] [id number]  Ortho Devices Type of Ortho Device: Volar splint, Arm sling Ortho Device/Splint Location: rue Ortho Device/Splint Interventions: Ordered, Application, Adjustment   Post Interventions Patient Tolerated: Well Instructions Provided: Care of device, Adjustment of device   Karolee Stamps 09/12/2017, 12:23 AM

## 2017-09-12 NOTE — Telephone Encounter (Signed)
PA submitted via covermymeds. Status pending. Will recheck in 24 hours. Wallace Cullens, RN

## 2017-09-12 NOTE — Telephone Encounter (Signed)
Patient called and left voicemail instructing him to call the office.  Would like for patient to check his weight daily and record this and bring this in with him to his next office visit. Patient will need to restart his Lasix on 09/13/2017 at 60mg  once daily if his weight is 160-163lb. If it is more than 163lb, patient should take this twice daily. He has an appointment scheduled on May 6,2019 at 9:10am for a weight check and re-education on how much Lasix to take.

## 2017-09-12 NOTE — Telephone Encounter (Signed)
Diclofenac gel approved through 05/15/18. [facility] informed. Wallace Cullens, RN

## 2017-09-13 ENCOUNTER — Other Ambulatory Visit: Payer: Self-pay

## 2017-09-14 NOTE — Telephone Encounter (Signed)
Was patient informed of instructions for lasix and of his office visit scheduled for May 6 at 9:10am?  Thanks

## 2017-09-14 NOTE — Telephone Encounter (Signed)
Called patient and informed of instructions. Patient was informed of directions several times. He stated understanding but had trouble repeating the directions. Patient has conflict with appt on Monday at 910. Rescheduled to 1110. Spoke with another person in the household who wrote information down.  Danley Danker, RN [facility] RN)

## 2017-09-14 NOTE — Telephone Encounter (Signed)
Patient returning call to PCP. Danley Danker, RN [facility] RN)

## 2017-09-14 NOTE — Assessment & Plan Note (Signed)
Patient no longer taking mobic due to worsening heart disease. Tylenol no longer helping patient and not taking in setting of cirrhosis. Will attempt for PA of Voltaren gel for patient's worsening wrist pain from arthritis.

## 2017-09-14 NOTE — Assessment & Plan Note (Addendum)
Patient instructed to hold all Lasix today given how dry he appears on exam. Attempted to call and told him to restart once daily Lasix and return to clinic on 5/06 for further education on lasix use and his fluids status. Left patient voicemail and instructed hi to call the office back with any questions.   BMP today with Cr bump to 2.29 in likely setting of over-diuresing.   Plan to continue with weight based lasix, but ensure that patient understands. - if weight >168lbs, take lasix TID - if weight 163-168 lbs, take lasix BID - if weight <163 lbs, take lasix once daily

## 2017-09-18 ENCOUNTER — Encounter: Payer: Self-pay | Admitting: Internal Medicine

## 2017-09-18 ENCOUNTER — Other Ambulatory Visit: Payer: Self-pay

## 2017-09-18 ENCOUNTER — Ambulatory Visit (INDEPENDENT_AMBULATORY_CARE_PROVIDER_SITE_OTHER): Payer: Medicare Other | Admitting: Internal Medicine

## 2017-09-18 ENCOUNTER — Ambulatory Visit: Payer: Medicare Other | Admitting: Internal Medicine

## 2017-09-18 VITALS — BP 118/64 | HR 90 | Temp 98.6°F | Ht 69.0 in | Wt 160.0 lb

## 2017-09-18 DIAGNOSIS — I509 Heart failure, unspecified: Secondary | ICD-10-CM | POA: Diagnosis not present

## 2017-09-18 DIAGNOSIS — S52531A Colles' fracture of right radius, initial encounter for closed fracture: Secondary | ICD-10-CM | POA: Diagnosis not present

## 2017-09-18 NOTE — Progress Notes (Addendum)
   [facility] Phone: [phone]  Subjective:  Baruch is a 77 year old male presenting to clinic for follow-up of his congestive heart failure. He was seen in clinic by his PCP on 09/11/17. He was advised to start doing weight-based lasix dosing with lasix tid for wt >168lb, lasix bid for wt 163-168lb, and lasix once daily for wt <163lb. He states he has just been taking the lasix bid. He has been weighing himself daily and his weights have been 158-161lb. He endorses some mild LE edema (R>L). He denies any shortness of breath or orthopnea. No chest pain. No fatigue.  ROS: See HPI for pertinent positives and negatives  Past Medical History- HFpEF, HTN, asthma, alcoholic cirrhosis, CKD III  Family history reviewed for today's visit. No changes.  Social history- patient is a never smoker  Objective: BP 118/64   Pulse 90   Temp 98.6 F (37 C) (Oral)   Ht 5\' 9"  (1.753 m)   Wt 160 lb (72.6 kg)   SpO2 98%   BMI 23.63 kg/m  Gen: NAD, alert, cooperative with exam HEENT: NCAT, EOMI, MMM Neck: FROM, supple, no JVD CV: RRR, no murmur Resp: CTABL, no wheezes, no crackles, normal work of breathing Msk: 1+ pitting edema to the mid shin (mildly worse on right than left), no calf tenderness, Homan's sign negative  Assessment/Plan: Congestive Heart Failure: Euvolemic on exam today. Weights have been stable at home, ranging from 158-161. - Reviewed PCP's plan for weight-based lasix dosing with patient: lasix tid for wt >168lb, lasix bid for wt 163-168lb, and lasix once daily for wt <163lb.  - Recheck BMP today (last Cr was elevated but this was thought to be secondary to over-diuresis) - Reinforced the importance of wearing compression stockings and taking in a low-salt diet - Follow-up with PCP in 1 month   Hyman Bible, MD PGY-3

## 2017-09-18 NOTE — Patient Instructions (Signed)
It was great seeing you today!  Dr. Enid Derry would like you to take your Lasix based on your weight: - if weight >168lbs, take lasix TID - if weight 163-168 lbs, take lasix BID - if weight <163 lbs, take lasix once daily  We checked your kidneys again today.  Please follow-up with Dr. Enid Derry in 1 month!  -Dr. Brett Albino

## 2017-09-18 NOTE — Assessment & Plan Note (Addendum)
Euvolemic on exam today. Weights have been stable at home, ranging from 158-161. - Reviewed PCP's plan for weight-based lasix dosing with patient: lasix tid for wt >168lb, lasix bid for wt 163-168lb, and lasix once daily for wt <163lb.  - Recheck BMP today (last Cr was elevated but this was thought to be secondary to over-diuresis) - Reinforced the importance of wearing compression stockings and taking in a low-salt diet - Follow-up with PCP in 1 month

## 2017-09-19 LAB — BASIC METABOLIC PANEL
BUN / CREAT RATIO: 19 (ref 10–24)
BUN: 39 mg/dL — ABNORMAL HIGH (ref 8–27)
CO2: 19 mmol/L — ABNORMAL LOW (ref 20–29)
CREATININE: 2.07 mg/dL — AB (ref 0.76–1.27)
Calcium: 9.2 mg/dL (ref 8.6–10.2)
Chloride: 100 mmol/L (ref 96–106)
GFR calc Af Amer: 35 mL/min/{1.73_m2} — ABNORMAL LOW (ref >59)
GFR calc non Af Amer: 30 mL/min/{1.73_m2} — ABNORMAL LOW (ref >59)
GLUCOSE: 94 mg/dL (ref 65–99)
Potassium: 4.7 mmol/L (ref 3.5–5.2)
SODIUM: 135 mmol/L (ref 134–144)

## 2017-09-21 ENCOUNTER — Other Ambulatory Visit: Payer: Self-pay | Admitting: Family Medicine

## 2017-09-21 ENCOUNTER — Telehealth: Payer: Self-pay | Admitting: Internal Medicine

## 2017-09-21 DIAGNOSIS — I509 Heart failure, unspecified: Secondary | ICD-10-CM

## 2017-09-21 NOTE — Telephone Encounter (Signed)
Called patient to discuss his lab results. Had to leave a voicemail. If he calls back, please let him know that his kidneys looked better. He should continue taking the Lasix as we discussed at his last appointment.  Hyman Bible, MD PGY-3

## 2017-09-22 NOTE — Telephone Encounter (Signed)
Pt called back, given message below. Pt states he is still taking his lasix daily. Wallace Cullens, RN

## 2017-09-25 ENCOUNTER — Other Ambulatory Visit: Payer: Self-pay | Admitting: Internal Medicine

## 2017-09-25 DIAGNOSIS — I509 Heart failure, unspecified: Secondary | ICD-10-CM

## 2017-09-26 ENCOUNTER — Telehealth: Payer: Self-pay | Admitting: Family Medicine

## 2017-09-26 NOTE — Telephone Encounter (Signed)
Patient is in a heart program and they are monitoring his weight for fluid increase.  On 09/23/17 he was 162 and today he is weighing in at 168.  They wanted to let you know and ask if he is actually supposed to take an extra fluid pill for this case.  Thanks.  [phone] is best number to reach pt.

## 2017-09-26 NOTE — Telephone Encounter (Signed)
Pt contacted and informed of lasix regime. I spoke with pts nephew who took detailed notes. Pts nephew repeated back to me the correct regime.

## 2017-09-26 NOTE — Telephone Encounter (Signed)
Patient should take an extra Lasix ( twice per day). His parameters are as follows: - if weight >168lbs, take lasix TID - if weight 163-168 lbs, take lasix BID - if weight <163 lbs, take lasix once daily

## 2017-09-26 NOTE — Telephone Encounter (Signed)
Will forward to MD to advise. Jazmin Hartsell,CMA  

## 2017-10-02 DIAGNOSIS — S52531D Colles' fracture of right radius, subsequent encounter for closed fracture with routine healing: Secondary | ICD-10-CM | POA: Diagnosis not present

## 2017-10-12 ENCOUNTER — Other Ambulatory Visit: Payer: Self-pay | Admitting: Family Medicine

## 2017-10-18 ENCOUNTER — Other Ambulatory Visit: Payer: Self-pay | Admitting: Family Medicine

## 2017-10-18 DIAGNOSIS — I509 Heart failure, unspecified: Secondary | ICD-10-CM

## 2017-10-25 NOTE — Progress Notes (Signed)
Subjective:    Patient ID: Donald Moreno, male    DOB: Sep 27, 1940, 77 y.o.   MRN: [id number]   CC: f/u CHF  HPI:  CHF: - last seen in the office on 5/06 and to continue weight based plan: lasix tid for wt >168lb, lasix bid for wt 163-168lb, and lasix once daily for wt <163lb.  - patient has been following this regimen, reports he got up as high as 169 and took 3 pills.  - he weighed 160lb this morning on his home scale - patient reports still having normal urine output, states he goes all the time and this is normal for him but he is not interested in going to a urologist - also states that he is feeling great and walking everyday like he usually does - low salt diet, compression stockings most days - ROS: denies scrotal swelling, uses 2 pillows at night which is his normal, and denies any leg swelling  Dry Skin: - Patient reports having dry skin. He has been using lotion but it is itchy and not improving - Taking benadryl periodically for it as well  Recent fall: - Patient recently fractured his R wrist - Taking Advil for pain but only has had a few times per day  Medication Management- see plan below  Smoking status reviewed  Review of Systems Per HPI, also denies headache, changes in vision, chest pain, shortness of breath   Patient Active Problem List   Diagnosis Date Noted  . Corn or callus 07/11/2017  . Macrocytic anemia 04/20/2017  . Cirrhosis of liver with ascites ([place])   . Congestive heart failure ([place])   . Hematuria 01/30/2017  . Alcoholic cirrhosis of liver with ascites ([place])   . Typical atrial flutter ([place])   . CKD (chronic kidney disease) stage 3, GFR 30-59 ml/min (HCC) 05/27/2015  . At risk for decreased bone density 03/04/2015  . Bradycardia 07/08/2013  . Asthma 01/29/2010  . Gout 07/13/2006  . HYPERTENSION, BENIGN SYSTEMIC 07/13/2006  . Arthritis 07/13/2006     Objective:  BP 110/66   Pulse 77   Temp 98.1 F (36.7 C) (Oral)   Wt 160 lb (72.6  kg)   SpO2 98%   BMI 23.63 kg/m  Vitals and nursing note reviewed  General: NAD, pleasant Head: Normocephalic, atraumatic Neck: Supple Cardiac: RRR, normal heart sounds, no murmurs Respiratory: CTAB, normal effort Extremities: no LE edema or cyanosis. WWP. R wrist in splint Gait: normal gait Skin: warm, no rashes noted, dry skin with areas of excoriation Neuro: alert and oriented, no focal deficits Psych: normal affect  Assessment & Plan:    Asthma Patient to continue his daily Wixela 1 puff, twice per day. Patient was using [facility] as his prn medication, but instructed to stop this and he is on Wixela daily already and to use his ProAir (albuterol inhaler) as needed. Also to continue his Singulair daily.  Congestive heart failure ([place]) Patient euvolemic today. Weights at home have been between 160-169, and he is following the current plan accordingly. Patient to continue weighing himself daily. Weight here today is 160lb. Reports that he feels great. He follows with [facility] and is unsure when his next appointment is.  - Recheck BMP today - Reinforced wearing compression stockings and low salt diet - return in 4-6 weeks or sooner as needed  CKD (chronic kidney disease) stage 3, GFR 30-59 ml/min Patient has just picked up new allopurinol prescription of 300 mg daily. Will dose adjust this  based on most recent GFR. In the mean-time patient to take half of his allopurinol pill that he has now. He was also informed to stop taking Advil as his pain has improved.   Dry Skin:  - Patient instructed to moisturize skin 1-2 times a day everyday with a mild, unscented creams such as Aveeno, CeraVe, Cetaphil or Eucerin. At night, let the cream dry and then cover with a barrier ointment such as Vaseline or Aquaphor - also to decrease use of his benadryl and use Claritin daily instead.   [place] Isahi Godwin, DO Family Medicine Resident PGY-1

## 2017-10-27 ENCOUNTER — Ambulatory Visit (INDEPENDENT_AMBULATORY_CARE_PROVIDER_SITE_OTHER): Payer: Medicare Other | Admitting: Family Medicine

## 2017-10-27 ENCOUNTER — Encounter: Payer: Self-pay | Admitting: Family Medicine

## 2017-10-27 ENCOUNTER — Other Ambulatory Visit: Payer: Self-pay

## 2017-10-27 VITALS — BP 110/66 | HR 77 | Temp 98.1°F | Wt 160.0 lb

## 2017-10-27 DIAGNOSIS — N183 Chronic kidney disease, stage 3 unspecified: Secondary | ICD-10-CM

## 2017-10-27 DIAGNOSIS — I5022 Chronic systolic (congestive) heart failure: Secondary | ICD-10-CM | POA: Diagnosis not present

## 2017-10-27 DIAGNOSIS — J45909 Unspecified asthma, uncomplicated: Secondary | ICD-10-CM | POA: Diagnosis not present

## 2017-10-27 MED ORDER — FLUTICASONE-SALMETEROL 500-50 MCG/DOSE IN AEPB: 1.0000 | INHALATION_SPRAY | Freq: Two times a day (BID) | RESPIRATORY_TRACT | 3 refills | Status: DC

## 2017-10-27 NOTE — Patient Instructions (Addendum)
Thank you for coming to see me today. It was a pleasure! Today we talked about:   Prevent dry skin by:  - Moisturize your  skin 1-2 times a day EVERY day with a mild, unscented creams such as Aveeno, CeraVe, Cetaphil or Eucerin. At night, let the cream dry and then cover with a barrier ointment such as Vaseline or Aquaphor - In the bath, use a mild, unscented soap such as Dove  For your itching, you can take loratadine or Claritin everyday to help with your itching.   Please continue taking your Wixela 1 puff, twice per day. Please stop taking your dulera as needed. Please take your ProAir (albuterol inhaler) as needed.   Limit the amount of Advil you take.   Continue with your weight based furosemide dosing: - if weight >168lbs, take lasix 3 times per day - if weight 163-168 lbs, take lasix 2 times per day - if weight <163 lbs, take lasix once daily  Please follow-up with me in 4-6 weeks or sooner as needed.  If you have any questions or concerns, please do not hesitate to call the office at [phone].  Take Care,   [place] Lera Gaines, DO

## 2017-10-27 NOTE — Assessment & Plan Note (Addendum)
Patient has just picked up new allopurinol prescription of 300 mg daily. Will dose adjust this based on most recent GFR. In the mean-time patient to take half of his allopurinol pill that he has now. He was also informed to stop taking Advil as his pain has improved.

## 2017-10-27 NOTE — Assessment & Plan Note (Signed)
Patient euvolemic today. Weights at home have been between 160-169, and he is following the current plan accordingly. Patient to continue weighing himself daily. Weight here today is 160lb. Reports that he feels great. He follows with [facility] and is unsure when his next appointment is.  - Recheck BMP today - Reinforced wearing compression stockings and low salt diet - return in 4-6 weeks or sooner as needed

## 2017-10-27 NOTE — Assessment & Plan Note (Addendum)
Patient to continue his daily Wixela 1 puff, twice per day. Patient was using [facility] as his prn medication, but instructed to stop this and he is on Wixela daily already and to use his ProAir (albuterol inhaler) as needed. Also to continue his Singulair daily.

## 2017-10-28 LAB — BASIC METABOLIC PANEL
BUN/Creatinine Ratio: 18 (ref 10–24)
BUN: 34 mg/dL — ABNORMAL HIGH (ref 8–27)
CO2: 23 mmol/L (ref 20–29)
Calcium: 10.4 mg/dL — ABNORMAL HIGH (ref 8.6–10.2)
Chloride: 99 mmol/L (ref 96–106)
Creatinine, Ser: 1.94 mg/dL — ABNORMAL HIGH (ref 0.76–1.27)
GFR calc Af Amer: 38 mL/min/{1.73_m2} — ABNORMAL LOW (ref >59)
GFR calc non Af Amer: 33 mL/min/{1.73_m2} — ABNORMAL LOW (ref >59)
Glucose: 84 mg/dL (ref 65–99)
Potassium: 4.8 mmol/L (ref 3.5–5.2)
Sodium: 137 mmol/L (ref 134–144)

## 2017-10-31 ENCOUNTER — Telehealth: Payer: Self-pay | Admitting: Family Medicine

## 2017-10-31 NOTE — Telephone Encounter (Signed)
Led patient and left voicemail informing him of his BMP results which are improved from his last visit. Also reminded him to take 0.5 tablet of the allopurinol.

## 2017-11-15 ENCOUNTER — Other Ambulatory Visit: Payer: Self-pay | Admitting: Family Medicine

## 2017-11-15 DIAGNOSIS — I509 Heart failure, unspecified: Secondary | ICD-10-CM

## 2017-11-22 ENCOUNTER — Other Ambulatory Visit: Payer: Self-pay | Admitting: Family Medicine

## 2017-11-22 DIAGNOSIS — I509 Heart failure, unspecified: Secondary | ICD-10-CM

## 2017-11-22 NOTE — Progress Notes (Signed)
   Subjective:    Patient ID: Donald Moreno, male    DOB: [birth year], 77 y.o.   MRN: [id number]   CC:  HPI:  CHF: - last seen in the office on 6/14 and continuing weight based plan: lasix tid for wt >168lb, lasix bid for wt 163-168lb, and lasix once daily for wt <163lb.  - patient has been following this regimen, reports he got up as high as 167 and took 3 pills  - he weighed 163.8lb this morning on his home scale - patient reports still having normal urine output, states he goes all the time - also states that he is feeling great and walking everyday like he usually does - takes spironolactone daily  - low salt diet reinforced, had bbq for the 4th and had to take more lasix, compression stockings most days -XYI:AXKPVV scrotal swelling, uses 2 pillows at night which is his normal, and denies any leg swelling, reports some breast swelling, but it is not causing an trouble  Cirrhosis: Patient reports he has follow up next week with doctor who did his TIPS procedure. Reports he feels fine, has had no ascites. He would like me to collect labs today so he doesn't have to worry about getting them drawn later.   Smoking status reviewed  Review of Systems Per HPI   Patient Active Problem List   Diagnosis Date Noted  . Corn or callus 07/11/2017  . Macrocytic anemia 04/20/2017  . Cirrhosis of liver with ascites ([place])   . Congestive heart failure ([place])   . Hematuria 01/30/2017  . Alcoholic cirrhosis of liver with ascites ([place])   . Typical atrial flutter ([place])   . CKD (chronic kidney disease) stage 3, GFR 30-59 ml/min (HCC) 05/27/2015  . At risk for decreased bone density 03/04/2015  . Bradycardia 07/08/2013  . Asthma 01/29/2010  . Gout 07/13/2006  . HYPERTENSION, BENIGN SYSTEMIC 07/13/2006  . Arthritis 07/13/2006     Objective:  BP 102/62   Pulse (!) 57   Temp 98.2 F (36.8 C) (Oral)   Wt 163 lb (73.9 kg)   SpO2 97%   BMI 24.07 kg/m  Vitals and nursing note  reviewed  General: NAD, pleasant Cardiac: Bradycardic, regular rhythm, normal heart sounds, no murmurs Respiratory: CTAB, normal effort Abdomen: soft, nontender, nondistended Extremities: no edema or cyanosis. WWP. Skin: warm and dry, no rashes noted Neuro: alert and oriented, no focal deficits Psych: normal affect  Assessment & Plan:    Alcoholic cirrhosis of liver with ascites (HCC) Will check CBC and CMP today. No ascites on exam.   Congestive heart failure ([place]) Patient euvolemic today, maybe a little dry on exam, as he states he accidentally took a day of extra pills a couple of days ago. Denies any dizziness. BP 102/62 and has been this low before and patient asymptomatic. Will continue to monitor. To continue his weight based treatment, compression stockings, and low salt diet.  - CMP today    [place] Chastin Garlitz, DO Family Medicine Resident PGY-2

## 2017-11-24 ENCOUNTER — Encounter: Payer: Self-pay | Admitting: Family Medicine

## 2017-11-24 ENCOUNTER — Other Ambulatory Visit: Payer: Self-pay

## 2017-11-24 ENCOUNTER — Ambulatory Visit (INDEPENDENT_AMBULATORY_CARE_PROVIDER_SITE_OTHER): Payer: Medicare Other | Admitting: Family Medicine

## 2017-11-24 VITALS — BP 102/62 | HR 57 | Temp 98.2°F | Wt 163.0 lb

## 2017-11-24 DIAGNOSIS — K7460 Unspecified cirrhosis of liver: Secondary | ICD-10-CM | POA: Diagnosis not present

## 2017-11-24 DIAGNOSIS — I509 Heart failure, unspecified: Secondary | ICD-10-CM | POA: Diagnosis not present

## 2017-11-24 DIAGNOSIS — K7031 Alcoholic cirrhosis of liver with ascites: Secondary | ICD-10-CM | POA: Diagnosis not present

## 2017-11-24 MED ORDER — BISOPROLOL FUMARATE 5 MG PO TABS: 2.5000 mg | ORAL_TABLET | Freq: Every day | ORAL | 1 refills | Status: DC

## 2017-11-24 NOTE — Patient Instructions (Addendum)
Thank you for coming to see me today. It was a pleasure! Glad you are doing well! Today we talked about:   Your heart: Continue to take your Lasix. We will call with your results. Continue taking 1/2 tab of the allopurinol.   Please follow-up with me in 6-8 weeks or sooner as needed.  If you have any questions or concerns, please do not hesitate to call the office at [phone].  Take Care,   [place] Maryalyce Sanjuan, DO

## 2017-11-24 NOTE — Assessment & Plan Note (Signed)
Patient euvolemic today, maybe a little dry on exam, as he states he accidentally took a day of extra pills a couple of days ago. Denies any dizziness. BP 102/62 and has been this low before and patient asymptomatic. Will continue to monitor. To continue his weight based treatment, compression stockings, and low salt diet.  - CMP today

## 2017-11-24 NOTE — Assessment & Plan Note (Signed)
Will check CBC and CMP today. No ascites on exam.

## 2017-11-25 ENCOUNTER — Other Ambulatory Visit: Payer: Self-pay | Admitting: Family Medicine

## 2017-11-25 LAB — CMP14+EGFR
ALBUMIN: 3.8 g/dL (ref 3.5–4.8)
ALT: 18 IU/L (ref 0–44)
AST: 36 IU/L (ref 0–40)
Albumin/Globulin Ratio: 1 — ABNORMAL LOW (ref 1.2–2.2)
Alkaline Phosphatase: 95 IU/L (ref 39–117)
BUN / CREAT RATIO: 16 (ref 10–24)
BUN: 31 mg/dL — ABNORMAL HIGH (ref 8–27)
Bilirubin Total: 1.2 mg/dL (ref 0.0–1.2)
CALCIUM: 9.9 mg/dL (ref 8.6–10.2)
CO2: 20 mmol/L (ref 20–29)
CREATININE: 1.88 mg/dL — AB (ref 0.76–1.27)
Chloride: 97 mmol/L (ref 96–106)
GFR, EST AFRICAN AMERICAN: 40 mL/min/{1.73_m2} — AB (ref >59)
GFR, EST NON AFRICAN AMERICAN: 34 mL/min/{1.73_m2} — AB (ref >59)
GLOBULIN, TOTAL: 4 g/dL (ref 1.5–4.5)
Glucose: 75 mg/dL (ref 65–99)
Potassium: 4.8 mmol/L (ref 3.5–5.2)
Sodium: 132 mmol/L — ABNORMAL LOW (ref 134–144)
TOTAL PROTEIN: 7.8 g/dL (ref 6.0–8.5)

## 2017-11-25 LAB — CBC
Hematocrit: 33.1 % — ABNORMAL LOW (ref 37.5–51.0)
Hemoglobin: 11.5 g/dL — ABNORMAL LOW (ref 13.0–17.7)
MCH: 35 pg — ABNORMAL HIGH (ref 26.6–33.0)
MCHC: 34.7 g/dL (ref 31.5–35.7)
MCV: 101 fL — AB (ref 79–97)
PLATELETS: 144 10*3/uL — AB (ref 150–450)
RBC: 3.29 x10E6/uL — ABNORMAL LOW (ref 4.14–5.80)
RDW: 13.5 % (ref 12.3–15.4)
WBC: 7.8 10*3/uL (ref 3.4–10.8)

## 2017-11-28 DIAGNOSIS — K7460 Unspecified cirrhosis of liver: Secondary | ICD-10-CM | POA: Diagnosis not present

## 2017-12-07 ENCOUNTER — Telehealth: Payer: Self-pay | Admitting: Cardiovascular Disease

## 2017-12-07 NOTE — Telephone Encounter (Signed)
April, nurse with [facility] calling, [phone] ext [phone]. Pt has had a 5lb weight gain since yesterday. Asymptomatic, using action plan with Lasix. Pls advice [phone]

## 2017-12-07 NOTE — Telephone Encounter (Signed)
Thank you :)

## 2017-12-07 NOTE — Telephone Encounter (Signed)
Called patient to check in per [place] calling in stating that patient had gained weight since yesterday. Patient now weighs 165lbs, patient understood to take 2 lasix per the protocol on his medication list.   Patient denies SOB, denies chest pains, no other symptoms to report other than the weight gain.   Patient stated that he would call if he began to have any other symptoms or pain, and if it got worse to go to ER.   Wanted to make you aware.

## 2017-12-20 ENCOUNTER — Other Ambulatory Visit (HOSPITAL_COMMUNITY): Payer: Self-pay | Admitting: Interventional Radiology

## 2017-12-20 DIAGNOSIS — Z95828 Presence of other vascular implants and grafts: Secondary | ICD-10-CM

## 2017-12-29 ENCOUNTER — Emergency Department (HOSPITAL_COMMUNITY)
Admission: EM | Admit: 2017-12-29 | Discharge: 2017-12-30 | Disposition: A | Discharge status: Home / Self Care | Payer: Medicare Other | Attending: Emergency Medicine | Admitting: Emergency Medicine

## 2017-12-29 ENCOUNTER — Telehealth: Payer: Self-pay | Admitting: Cardiovascular Disease

## 2017-12-29 ENCOUNTER — Encounter (HOSPITAL_COMMUNITY): Payer: Self-pay | Admitting: Emergency Medicine

## 2017-12-29 DIAGNOSIS — I130 Hypertensive heart and chronic kidney disease with heart failure and stage 1 through stage 4 chronic kidney disease, or unspecified chronic kidney disease: Secondary | ICD-10-CM | POA: Insufficient documentation

## 2017-12-29 DIAGNOSIS — Z79899 Other long term (current) drug therapy: Secondary | ICD-10-CM | POA: Insufficient documentation

## 2017-12-29 DIAGNOSIS — R319 Hematuria, unspecified: Secondary | ICD-10-CM | POA: Diagnosis not present

## 2017-12-29 DIAGNOSIS — J45909 Unspecified asthma, uncomplicated: Secondary | ICD-10-CM | POA: Insufficient documentation

## 2017-12-29 DIAGNOSIS — I509 Heart failure, unspecified: Secondary | ICD-10-CM | POA: Insufficient documentation

## 2017-12-29 DIAGNOSIS — N183 Chronic kidney disease, stage 3 (moderate): Secondary | ICD-10-CM | POA: Diagnosis not present

## 2017-12-29 DIAGNOSIS — F17220 Nicotine dependence, chewing tobacco, uncomplicated: Secondary | ICD-10-CM | POA: Insufficient documentation

## 2017-12-29 LAB — CBC WITH DIFFERENTIAL/PLATELET
Abs Immature Granulocytes: 0 10*3/uL (ref 0.0–0.1)
BASOS PCT: 1 %
Basophils Absolute: 0.1 10*3/uL (ref 0.0–0.1)
EOS ABS: 0.3 10*3/uL (ref 0.0–0.7)
Eosinophils Relative: 3 %
HCT: 38.5 % — ABNORMAL LOW (ref 39.0–52.0)
Hemoglobin: 12.9 g/dL — ABNORMAL LOW (ref 13.0–17.0)
IMMATURE GRANULOCYTES: 0 %
Lymphocytes Relative: 28 %
Lymphs Abs: 2.2 10*3/uL (ref 0.7–4.0)
MCH: 35.2 pg — AB (ref 26.0–34.0)
MCHC: 33.5 g/dL (ref 30.0–36.0)
MCV: 105.2 fL — AB (ref 78.0–100.0)
MONOS PCT: 9 %
Monocytes Absolute: 0.7 10*3/uL (ref 0.1–1.0)
NEUTROS PCT: 59 %
Neutro Abs: 4.6 10*3/uL (ref 1.7–7.7)
PLATELETS: 143 10*3/uL — AB (ref 150–400)
RBC: 3.66 MIL/uL — ABNORMAL LOW (ref 4.22–5.81)
RDW: 12.5 % (ref 11.5–15.5)
WBC: 7.9 10*3/uL (ref 4.0–10.5)

## 2017-12-29 LAB — COMPREHENSIVE METABOLIC PANEL
ALT: 32 U/L (ref 0–44)
AST: 46 U/L — AB (ref 15–41)
Albumin: 3.5 g/dL (ref 3.5–5.0)
Alkaline Phosphatase: 89 U/L (ref 38–126)
Anion gap: 10 (ref 5–15)
BUN: 29 mg/dL — ABNORMAL HIGH (ref 8–23)
CHLORIDE: 99 mmol/L (ref 98–111)
CO2: 24 mmol/L (ref 22–32)
Calcium: 9.5 mg/dL (ref 8.9–10.3)
Creatinine, Ser: 2.33 mg/dL — ABNORMAL HIGH (ref 0.61–1.24)
GFR calc Af Amer: 30 mL/min — ABNORMAL LOW (ref >60)
GFR, EST NON AFRICAN AMERICAN: 26 mL/min — AB (ref >60)
Glucose, Bld: 111 mg/dL — ABNORMAL HIGH (ref 70–99)
POTASSIUM: 4.2 mmol/L (ref 3.5–5.1)
Sodium: 133 mmol/L — ABNORMAL LOW (ref 135–145)
Total Bilirubin: 1.7 mg/dL — ABNORMAL HIGH (ref 0.3–1.2)
Total Protein: 7.9 g/dL (ref 6.5–8.1)

## 2017-12-29 LAB — URINALYSIS, ROUTINE W REFLEX MICROSCOPIC
Bilirubin Urine: NEGATIVE
Glucose, UA: NEGATIVE mg/dL
Ketones, ur: NEGATIVE mg/dL
NITRITE: NEGATIVE
Protein, ur: >300 mg/dL — AB
SPECIFIC GRAVITY, URINE: 1.015 (ref 1.005–1.030)
pH: 6.5 (ref 5.0–8.0)

## 2017-12-29 LAB — URINALYSIS, MICROSCOPIC (REFLEX): RBC / HPF: >50 RBC/hpf (ref 0–5)

## 2017-12-29 LAB — PROTIME-INR
INR: 1.13
PROTHROMBIN TIME: 14.4 s (ref 11.4–15.2)

## 2017-12-29 NOTE — ED Triage Notes (Signed)
Patient reports hematuria this evening , denies injury or fall, no dysuria or fever .

## 2017-12-29 NOTE — Telephone Encounter (Signed)
New Message:     Janett Billow from [facility] is calling to report a 5.6 lbs weight loss for the patient in one day. She states the pt denies any symptoms but he did take 3 fluid pills on yesterday.

## 2017-12-29 NOTE — Telephone Encounter (Signed)
Attempted to contact Jessica with [facility] to verify that patient is doing well, and patient is having no other symptoms.   Gave call back number.

## 2017-12-30 MED ORDER — CEPHALEXIN 500 MG PO CAPS: 500.0000 mg | ORAL_CAPSULE | Freq: Three times a day (TID) | ORAL | 0 refills | Status: DC

## 2017-12-30 MED ORDER — CEPHALEXIN 250 MG PO CAPS
500.0000 mg | ORAL_CAPSULE | Freq: Once | ORAL | Status: AC
  Administered 2017-12-30: 500 mg via ORAL
  Filled 2017-12-30: qty 2

## 2018-01-02 LAB — URINE CULTURE

## 2018-01-03 ENCOUNTER — Telehealth: Payer: Self-pay | Admitting: *Deleted

## 2018-01-03 DIAGNOSIS — N3000 Acute cystitis without hematuria: Secondary | ICD-10-CM | POA: Diagnosis not present

## 2018-01-03 DIAGNOSIS — R310 Gross hematuria: Secondary | ICD-10-CM | POA: Diagnosis not present

## 2018-01-03 IMAGING — US US PARACENTESIS
1 series · 8 of 8 positions shown · non-contrast
Comparison: none

INDICATION: Patient with history of cirrhosis and recurrent abdominal ascites.
Request is made for diagnostic and therapeutic paracentesis.

[Series 1: us paracentesis · 0.26mm/px · 8 of 8 slices shown]
[im 1/8]
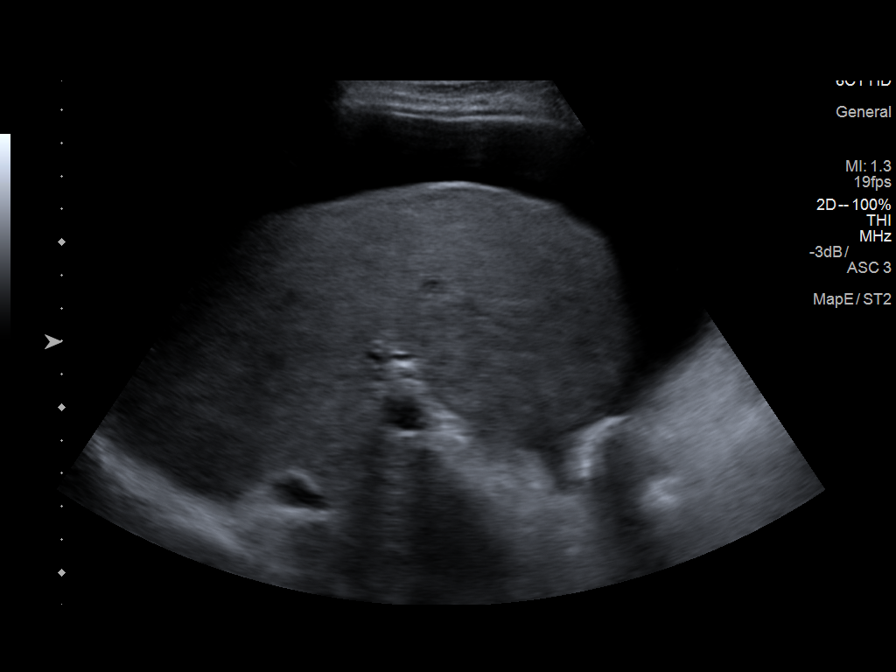
[im 2/8]
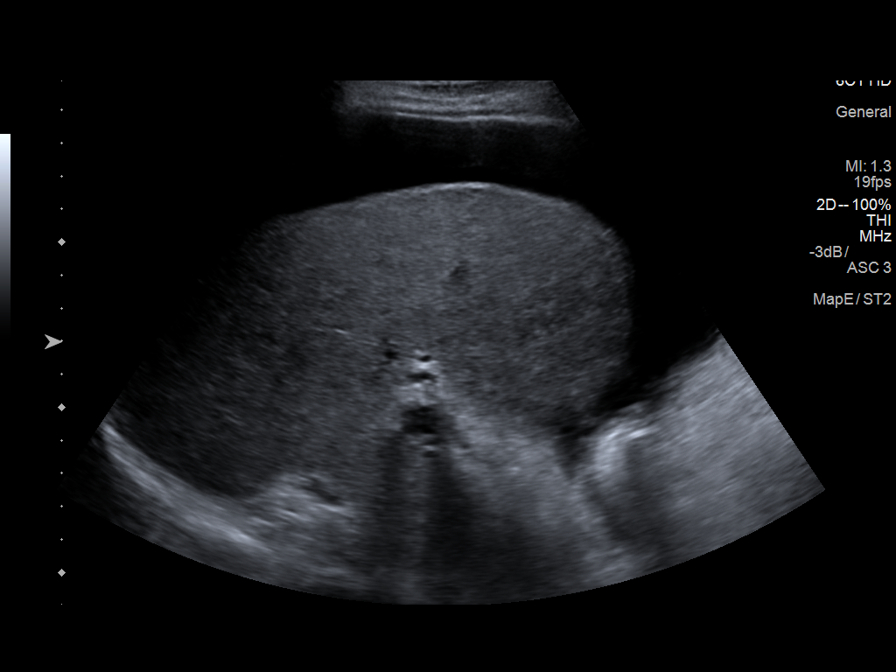
[im 3/8]
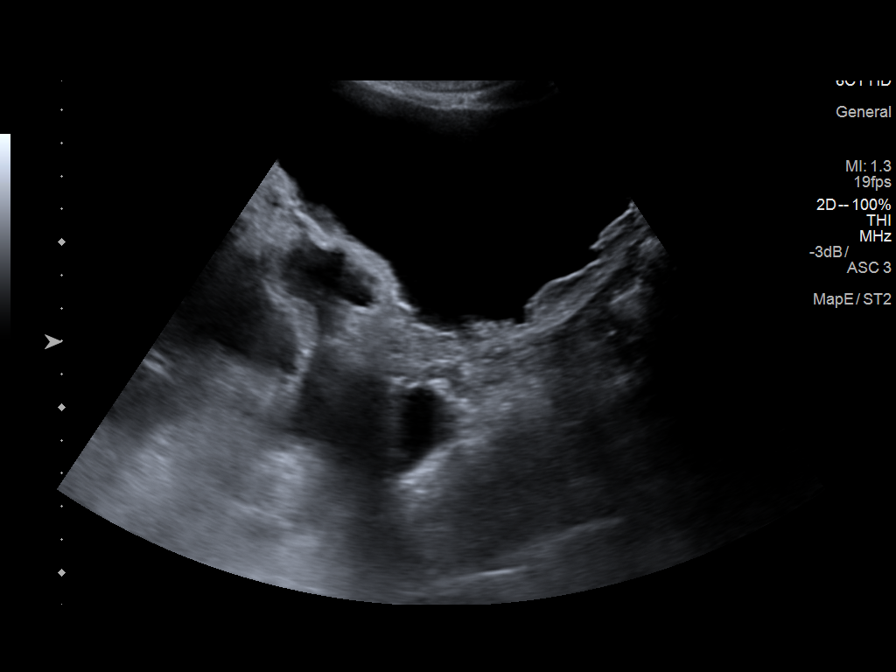
[im 4/8]
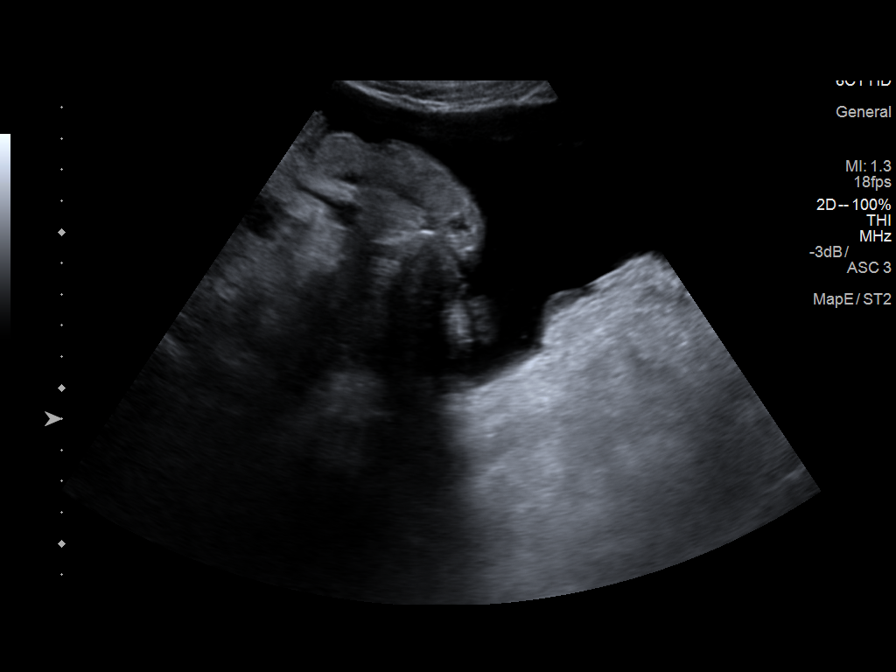
[im 5/8]
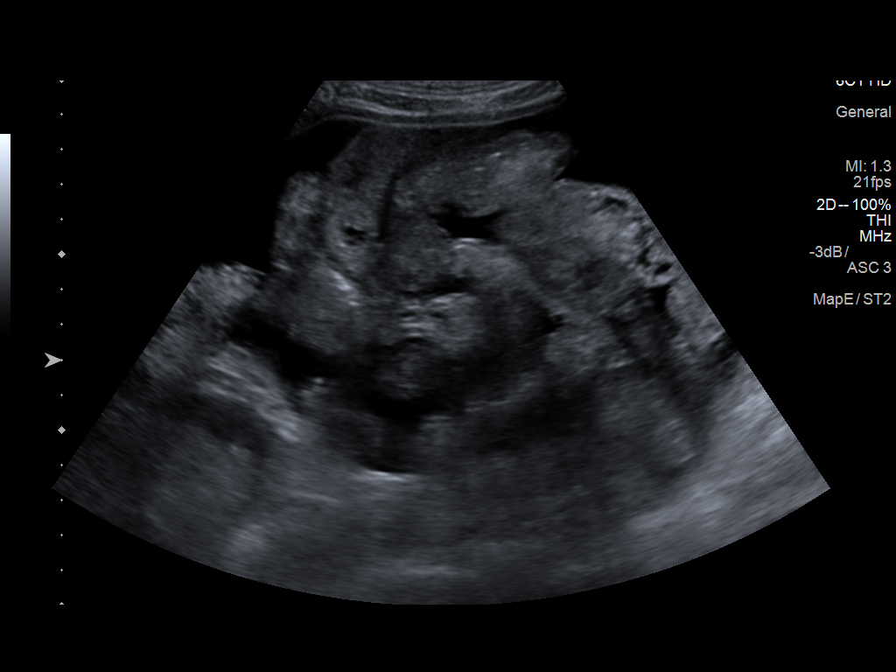
[im 6/8]
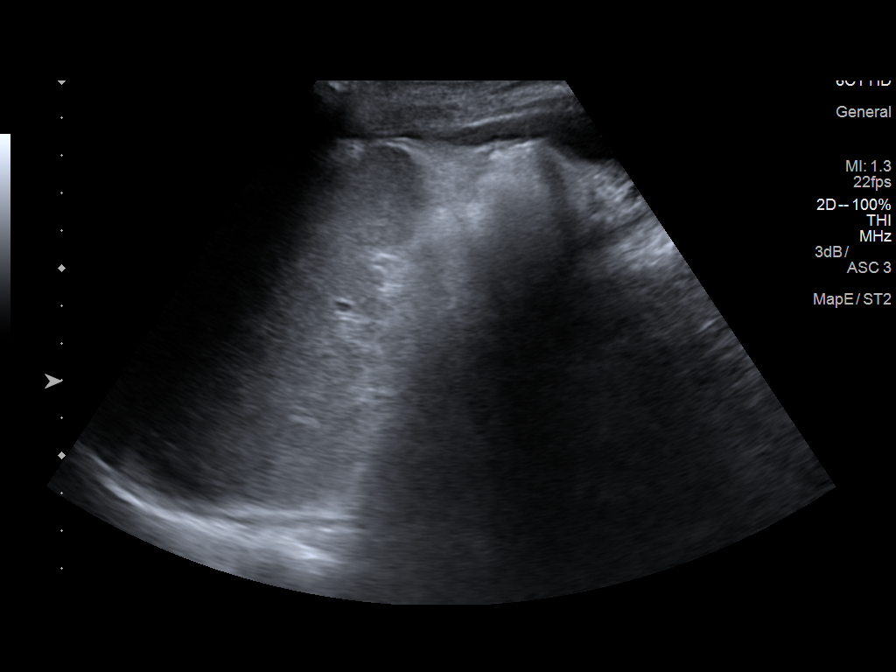
[im 7/8]
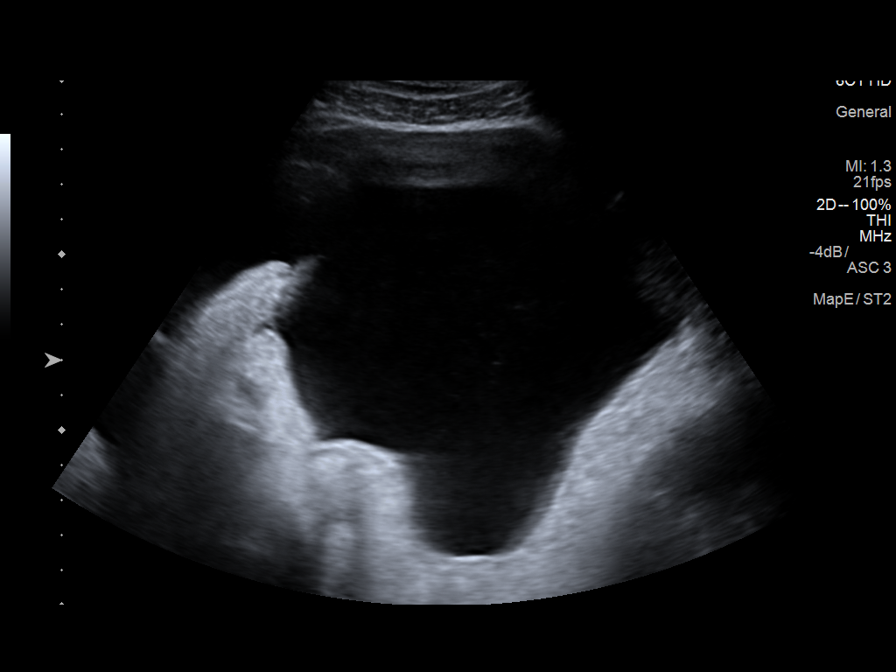
[im 8/8]
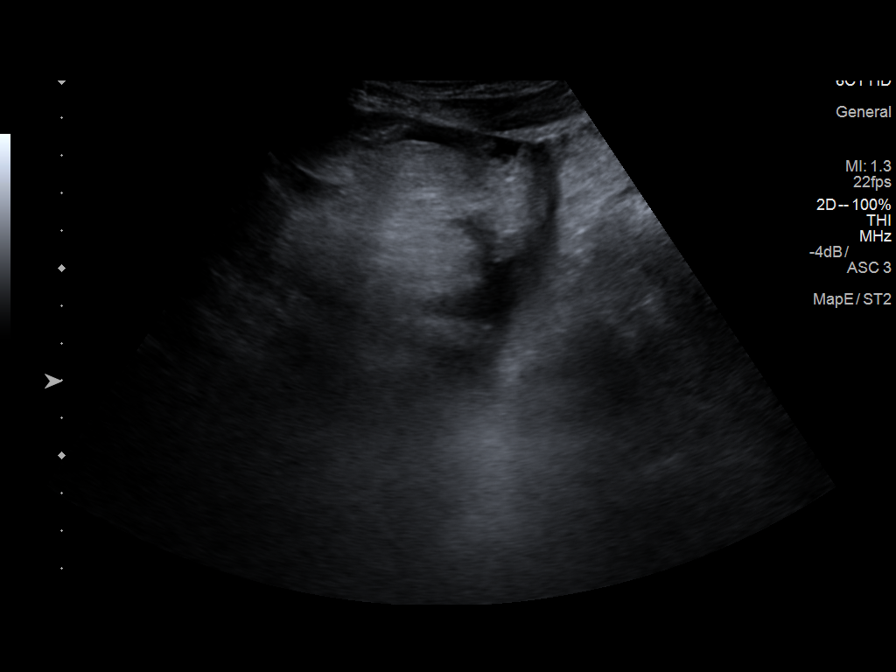

[8 of 8 positions shown; findings below may reference images not displayed]

EXAM:
ULTRASOUND GUIDED DIAGNOSTIC AND THERAPEUTIC PARACENTESIS

MEDICATIONS:
10 mL 1% lidocaine

COMPLICATIONS:
None immediate.

PROCEDURE:
Informed written consent was obtained from the patient after a
discussion of the risks, benefits and alternatives to treatment. A
timeout was performed prior to the initiation of the procedure.

Initial ultrasound scanning demonstrates a large amount of ascites
within the right lateral abdomen. The right lateral abdomen was
prepped and draped in the usual sterile fashion. 1% lidocaine was
used for local anesthesia.

Following this, a 19 gauge, 7-cm, Yueh catheter was introduced. An
ultrasound image was saved for documentation purposes. The
paracentesis was performed. The catheter was removed and a dressing
was applied. The patient tolerated the procedure well without
immediate post procedural complication.
FINDINGS: A total of approximately 4.1 liters of clear, yellow fluid was
removed. Samples were sent to the laboratory as requested by the
clinical team.
IMPRESSION: Successful ultrasound-guided paracentesis yielding 4.1 liters of
peritoneal fluid.

## 2018-01-03 NOTE — Progress Notes (Signed)
ED Antimicrobial Stewardship Positive Culture Follow Up   Donald Moreno is an 77 y.o. male who presented to [facility] on 12/29/2017 with a chief complaint of  Chief Complaint  Patient presents with  . Hematuria    Recent Results (from the past 720 hour(s))  Urine culture     Status: Abnormal   Collection Time: 12/30/17  2:39 AM  Result Value Ref Range Status   Specimen Description URINE, RANDOM  Final   Special Requests   Final    NONE Performed at [facility], [address], [place], [place] [address]    Culture 80,000 COLONIES/mL ENTEROCOCCUS FAECALIS (A)  Final   Report Status 01/02/2018 FINAL  Final   Organism ID, Bacteria ENTEROCOCCUS FAECALIS (A)  Final      Susceptibility   Enterococcus faecalis - MIC*    AMPICILLIN <=2 SENSITIVE Sensitive     LEVOFLOXACIN 2 SENSITIVE Sensitive     NITROFURANTOIN <=16 SENSITIVE Sensitive     VANCOMYCIN 1 SENSITIVE Sensitive     * 80,000 COLONIES/mL ENTEROCOCCUS FAECALIS    [x]  Treated with cephalexin, organism resistant to prescribed antimicrobial []  Patient discharged originally without antimicrobial agent and treatment is now indicated  New antibiotic prescription: DC cephalexin, start amoxicillin 500mg  PO BID x 5 days  ED Provider: Nuala Alpha, PA   Edgar Corrigan, Rande Lawman 01/03/2018, 9:16 AM Clinical Pharmacist Monday - Friday phone -  [phone] Saturday - Sunday phone - [phone]

## 2018-01-03 NOTE — Telephone Encounter (Signed)
Post ED Visit - Positive Culture Follow-up: Unsuccessful Patient Follow-up  Culture assessed and recommendations reviewed by:  []  Elenor Quinones, Pharm.D. []  Heide Guile, Pharm.D., BCPS AQ-ID []  Parks Neptune, Pharm.D., BCPS []  Alycia Rossetti, Pharm.D., BCPS []  [place], [place].D., BCPS, AAHIVP []  Legrand Como, Pharm.D., BCPS, AAHIVP []  Wynell Balloon, PharmD []  Vincenza Hews, PharmD, BCPS  Positive urine culture, reviewed by Nuala Alpha, PA-C  []  Patient discharged without antimicrobial prescription and treatment is now indicated [x]  Organism is resistant to prescribed ED discharge antimicrobial []  Patient with positive blood cultures  Plan:  D/C Cephalexin, Start Amoxicillin 500mg  PO BID x 5 days  Unable to contact patient after 3 attempts, letter will be sent to address on file  Gregg, [place] 01/03/2018, 11:08 AM

## 2018-01-03 NOTE — Telephone Encounter (Signed)
Called patient to check in to see how he was doing, LVM with call back number to discuss how he was.

## 2018-01-03 NOTE — ED Provider Notes (Signed)
[facility] EMERGENCY DEPARTMENT Provider Note   CSN: [id number] Arrival date & time: 12/29/17  2117     History   Chief Complaint Chief Complaint  Patient presents with  . Hematuria    HPI Donald Moreno is a 77 y.o. male.  HPI Patient reports new onset hematuria which developed this evening.  No fevers or chills.  Denies nausea vomiting.  Patient is never had symptoms like this before.  Denies use of anticoagulants.  No significant dysuria but does report some new urinary frequency.  No blood clots noted in his urine.  No history of kidney stones.  No flank pain.  No recent significant weight changes.  Given the new hematuria he presented to the emergency department for evaluation.  Painless hematuria at this time   Past Medical History:  Diagnosis Date  . Acute congestive heart failure ([place])   . Arthritis    "all over"  . Asthma   . Blood in stool 04/11/2017  . Chronic kidney disease    "I see a kidney dr @ [facility]" (10/11/2016)  . Chronic lower back pain    "turned a truck over a long time ago" (10/11/2016)  . Dysrhythmia    bradycardia with occassional junctional rhythm  . ETOH abuse   . Gout   . Hypertension   . Inguinal hernia 10/28/2014   S/P inguinal hernia repair with 4L as ascites removed 03/30/17  . Liver cirrhosis ([place]) 2015  . Macrocytosis   . Pneumonia    "long long time ago" (10/11/2016)  . Spontaneous bacterial peritonitis ([place]) 07/01/2015    Patient Active Problem List   Diagnosis Date Noted  . Corn or callus 07/11/2017  . Macrocytic anemia 04/20/2017  . Cirrhosis of liver with ascites ([place])   . Congestive heart failure ([place])   . Hematuria 01/30/2017  . Alcoholic cirrhosis of liver with ascites ([place])   . Typical atrial flutter ([place])   . CKD (chronic kidney disease) stage 3, GFR 30-59 ml/min (HCC) 05/27/2015  . At risk for decreased bone density 03/04/2015  . Bradycardia 07/08/2013  . Asthma 01/29/2010  . Gout  07/13/2006  . HYPERTENSION, BENIGN SYSTEMIC 07/13/2006  . Arthritis 07/13/2006    Past Surgical History:  Procedure Laterality Date  . INGUINAL HERNIA REPAIR Bilateral 03/30/2017   Procedure: OPEN BILATERAL INGUINAL HERNIA REPAIR WITH MESH;  Surgeon: Kinsinger, Arta Bruce, MD;  Location: [facility];  Service: General;  Laterality: Bilateral;  GENERAL COMBINED WITH REGIONAL FOR POST OP PAIN   . INSERTION OF MESH N/A 10/16/2016   Procedure: INSERTION OF MESH;  Surgeon: Kinsinger, Arta Bruce, MD;  Location: [facility];  Service: General;  Laterality: N/A;  . INSERTION OF MESH Bilateral 03/30/2017   Procedure: INSERTION OF MESH;  Surgeon: Kieth Brightly Arta Bruce, MD;  Location: [facility];  Service: General;  Laterality: Bilateral;  GENERAL COMBINED WITH REGIONAL FOR POST OP PAIN   . IR PARACENTESIS  08/19/2016  . IR PARACENTESIS  08/26/2016  . IR PARACENTESIS  09/02/2016  . IR PARACENTESIS  09/09/2016  . IR PARACENTESIS  09/16/2016  . IR PARACENTESIS  10/03/2016  . IR PARACENTESIS  10/11/2016  . IR RADIOLOGIST EVAL & MGMT  09/29/2016  . IR RADIOLOGIST EVAL & MGMT  12/21/2016  . IR TIPS  10/11/2016  . RADIOLOGY WITH ANESTHESIA N/A 10/11/2016   Procedure: TIPS;  Surgeon: Arne Cleveland, MD;  Location: [facility];  Service: Radiology;  Laterality: N/A;  . UMBILICAL HERNIA REPAIR N/A 10/16/2016  Procedure: HERNIA REPAIR UMBILICAL ADULT;  Surgeon: Kinsinger, Arta Bruce, MD;  Location: [facility];  Service: General;  Laterality: N/A;        Home Medications    Prior to Admission medications   Medication Sig Start Date End Date Taking? Authorizing Provider  allopurinol (ZYLOPRIM) 300 MG tablet TAKE 1 TABLET BY MOUTH EVERY DAY 10/13/17   Shirley, [place], DO  bisoprolol (ZEBETA) 5 MG tablet Take 0.5 tablets (2.5 mg total) by mouth daily. 11/24/17   Shirley, [place], DO  cephALEXin (KEFLEX) 500 MG capsule Take 1 capsule (500 mg total) by mouth 3 (three) times daily. 12/30/17   Jola Schmidt, MD  Cholecalciferol (VITAMIN D-3) 5000  units TABS Take 5,000 Units daily by mouth.     [provider]  diclofenac sodium (VOLTAREN) 1 % GEL Apply 2 g topically 4 (four) times daily. 09/11/17   Shirley, [place], DO  ferrous sulfate 325 (65 FE) MG tablet Take 1 tablet (325 mg total) by mouth 2 (two) times daily with a meal. 10/21/14   Gottschalk, Ashly M, DO  Fluticasone-Salmeterol (WIXELA INHUB) 500-50 MCG/DOSE AEPB Inhale 1 puff into the lungs 2 (two) times daily. 10/27/17   Shirley, [place], DO  furosemide (LASIX) 40 MG tablet Take 1.5 tablets (60 mg total) by mouth 3 (three) times daily. Take 60 mg 1-3 times daily based on daily weight measurements. 08/11/17   Everrett Coombe, MD  furosemide (LASIX) 40 MG tablet lasix 3 times a day for wt >168lb, lasix 2 times a day for wt 163-168lb, lasix once daily for wt <163lb. 11/15/17   Shirley, [place], DO  lactulose (CHRONULAC) 10 GM/15ML solution TAKE 15 MLS BY MOUTH 2 (TWO) TIMES DAILY AS NEEDED (NEED TO ENSURE 2 SOFT BM IN A DAY.). 11/27/17   Shirley, [place], DO  montelukast (SINGULAIR) 10 MG tablet Take 1 tablet (10 mg total) by mouth at bedtime. 08/01/17   Shirley, [place], DO  Multiple Vitamin (MULTIVITAMIN WITH MINERALS) TABS tablet Take 1 tablet by mouth daily. 04/18/17   Shirley, [place], DO  Nutritional Supplements (ENSURE COMPLETE SHAKE) LIQD Take 1 Container by mouth daily. Patient taking differently: Take 1 Container 2 (two) times daily by mouth.  03/04/15   Katheren Shams, DO  PROAIR HFA 108 [phone] Base) MCG/ACT inhaler Use 2 puffs every 4 hours  as needed for wheezing or  shortness of breath 11/26/15   Haney, Yetta Flock A, MD  spironolactone (ALDACTONE) 25 MG tablet Take 1 tablet (25 mg total) by mouth daily. 06/01/17   Shirley, [place], DO  spironolactone (ALDACTONE) 25 MG tablet TAKE 1 TABLET BY MOUTH EVERY DAY 11/22/17   Shirley, [place], DO    Family History Family History  Problem Relation Age of Onset  . Other Mother        died when pt was only 18 - unknown cause.  . Other Father         deceased.  . Asthma Sister   . Heart disease Sister        multiple stents.  . Asthma Brother   . Hypertension Brother   . Asthma Son   . Asthma Brother   . Hypertension Brother     Social History Social History   Tobacco Use  . Smoking status: Never Smoker  . Smokeless tobacco: Current User    Types: Snuff, Chew  Substance Use Topics  . Alcohol use: No    Comment: quit drinking "over 2 yr ago" (10/11/2016)  . Drug use: No  Allergies   Patient has no known allergies.   Review of Systems Review of Systems  All other systems reviewed and are negative.    Physical Exam Updated Vital Signs BP 116/88   Pulse (!) 117   Temp 98.4 F (36.9 C) (Oral)   Resp 18   Ht 5\' 10"  (1.778 m)   Wt 72.6 kg   SpO2 99%   BMI 22.96 kg/m   Physical Exam  Constitutional: He is oriented to person, place, and time. He appears well-developed and well-nourished.  HENT:  Head: Normocephalic.  Eyes: EOM are normal.  Neck: Normal range of motion.  Cardiovascular:  Mild tachycardia.  Regular rhythm.  Pulmonary/Chest: Effort normal and breath sounds normal.  Abdominal: Soft. He exhibits no distension. There is no tenderness.  Genitourinary:  Genitourinary Comments: No CVA tenderness.  Scrotum, testicles, penis normal in appearance.  No blood at the urethral meatus.  Musculoskeletal: Normal range of motion.  Neurological: He is alert and oriented to person, place, and time.  Psychiatric: He has a normal mood and affect.  Nursing note and vitals reviewed.    ED Treatments / Results  Labs (all labs ordered are listed, but only abnormal results are displayed) Labs Reviewed  URINE CULTURE - Abnormal; Notable for the following components:      Result Value             All other components within normal limits  CBC WITH DIFFERENTIAL/PLATELET - Abnormal; Notable for the following components:   RBC 3.66 (*)    Hemoglobin 12.9 (*)    HCT 38.5 (*)    MCV 105.2 (*)    MCH 35.2 (*)     Platelets 143 (*)    All other components within normal limits  COMPREHENSIVE METABOLIC PANEL - Abnormal; Notable for the following components:   Sodium 133 (*)    Glucose, Bld 111 (*)    BUN 29 (*)    Creatinine, Ser 2.33 (*)    AST 46 (*)    Total Bilirubin 1.7 (*)    GFR calc non Af Amer 26 (*)    GFR calc Af Amer 30 (*)    All other components within normal limits  URINALYSIS, ROUTINE W REFLEX MICROSCOPIC - Abnormal; Notable for the following components:   Color, Urine RED (*)    APPearance TURBID (*)    Hgb urine dipstick LARGE (*)    Protein, ur >300 (*)    Leukocytes, UA SMALL (*)    All other components within normal limits  URINALYSIS, MICROSCOPIC (REFLEX) - Abnormal; Notable for the following components:   Bacteria, UA FEW (*)    All other components within normal limits  PROTIME-INR    EKG None  Radiology No results found.  Procedures Procedures (including critical care time)  Medications Ordered in ED Medications  cephALEXin (KEFLEX) capsule 500 mg (500 mg Oral Given 12/30/17 0259)     Initial Impression / Assessment and Plan / ED Course  I have reviewed the triage vital signs and the nursing notes.  Pertinent labs & imaging results that were available during my care of the patient were reviewed by me and considered in my medical decision making (see chart for details).     May represent hemorrhagic cystitis given new hematuria and few bacteria on urine.  Home with oral antibiotics at this time.  Urine culture sent.  Patient informed that he will need urologic follow-up for hematuria and may benefit from cystoscopy if hematuria persists.  Patient understands return to the emergency department for new or worsening symptoms.  Final Clinical Impressions(s) / ED Diagnoses   Final diagnoses:  Hematuria, unspecified type    ED Discharge Orders         Ordered    cephALEXin (KEFLEX) 500 MG capsule  3 times daily     12/30/17 0329             Jola Schmidt, MD 01/03/18 [phone]

## 2018-01-04 NOTE — Telephone Encounter (Signed)
Left message for pt to call.

## 2018-01-06 ENCOUNTER — Other Ambulatory Visit: Payer: Self-pay | Admitting: Family Medicine

## 2018-01-06 DIAGNOSIS — I509 Heart failure, unspecified: Secondary | ICD-10-CM

## 2018-01-10 ENCOUNTER — Ambulatory Visit: Payer: Medicare Other | Admitting: Family Medicine

## 2018-01-10 ENCOUNTER — Other Ambulatory Visit: Payer: Self-pay | Admitting: Family Medicine

## 2018-01-10 ENCOUNTER — Telehealth: Payer: Self-pay | Admitting: *Deleted

## 2018-01-10 NOTE — Telephone Encounter (Signed)
Post ED Visit - Positive Culture Follow-up  Culture report reviewed by antimicrobial stewardship pharmacist:  []  Elenor Quinones, Pharm.D. []  Heide Guile, Pharm.D., BCPS AQ-ID []  Parks Neptune, Pharm.D., BCPS []  Alycia Rossetti, Pharm.D., BCPS []  [place], Pharm.D., BCPS, AAHIVP []  Legrand Como, Pharm.D., BCPS, AAHIVP [x]  Salome Arnt, PharmD, BCPS []  Johnnette Gourd, PharmD, BCPS []  Hughes Better, PharmD, BCPS []  Leeroy Cha, PharmD  Positive urine culture Treated with Cephalexin.  Spoke with patient who states he saw his PCP who changed his antibiotic therapy and he is doing better.  Asymptomatic at present and no further treatment provided.  Donald Moreno, Donald Moreno [facility] 01/10/2018, 9:53 AM

## 2018-01-17 IMAGING — US US PARACENTESIS
1 series · 8 of 8 positions shown · non-contrast
Comparison: none

INDICATION: Patient with recurrent abdominal ascites. Request is made for
diagnostic and therapeutic paracentesis. Patient to receive 50g
albumin post-procedure

[Series 1: us paracentesis · 0.26mm/px · 8 of 8 slices shown]
[im 1/8]
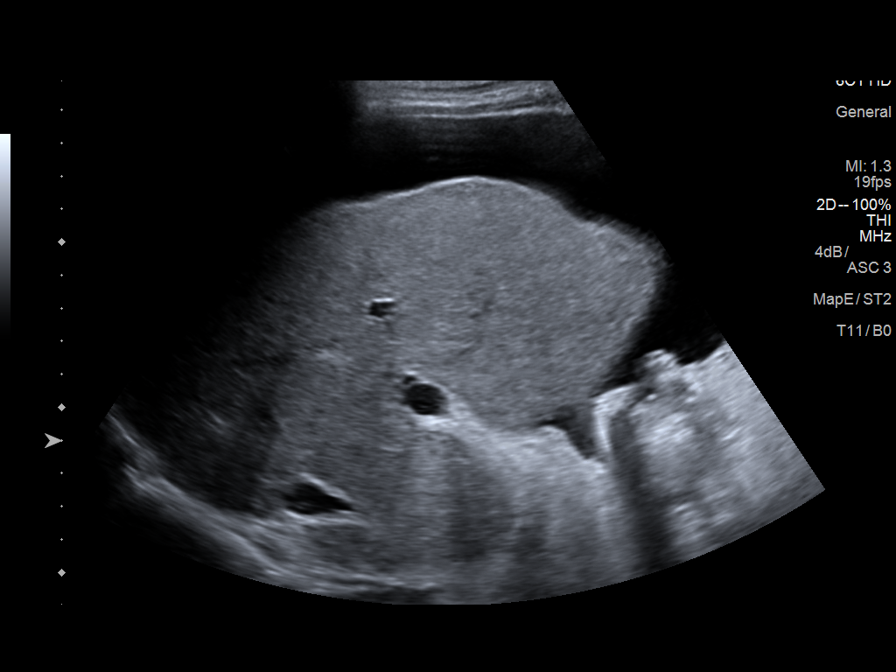
[im 2/8]
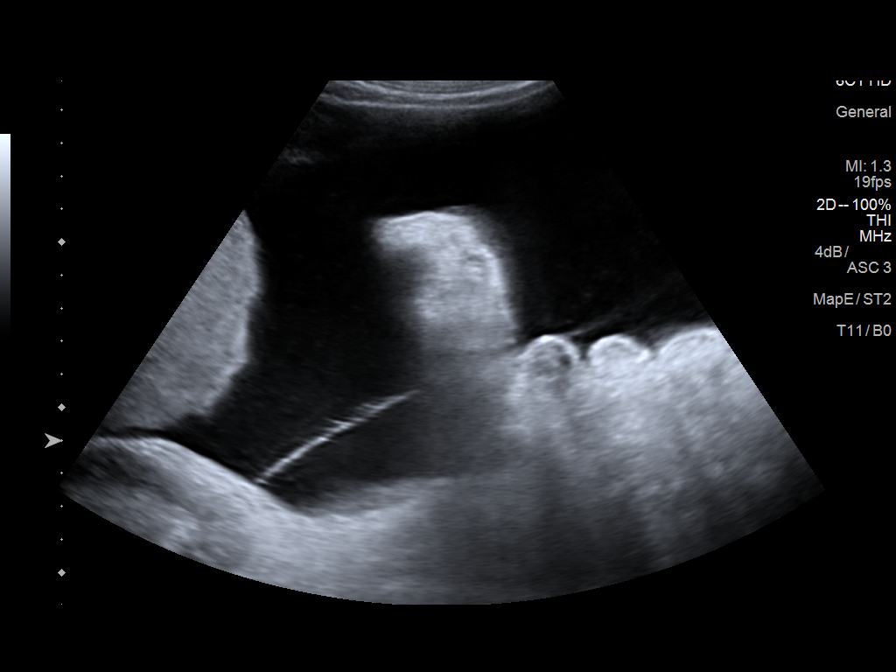
[im 3/8]
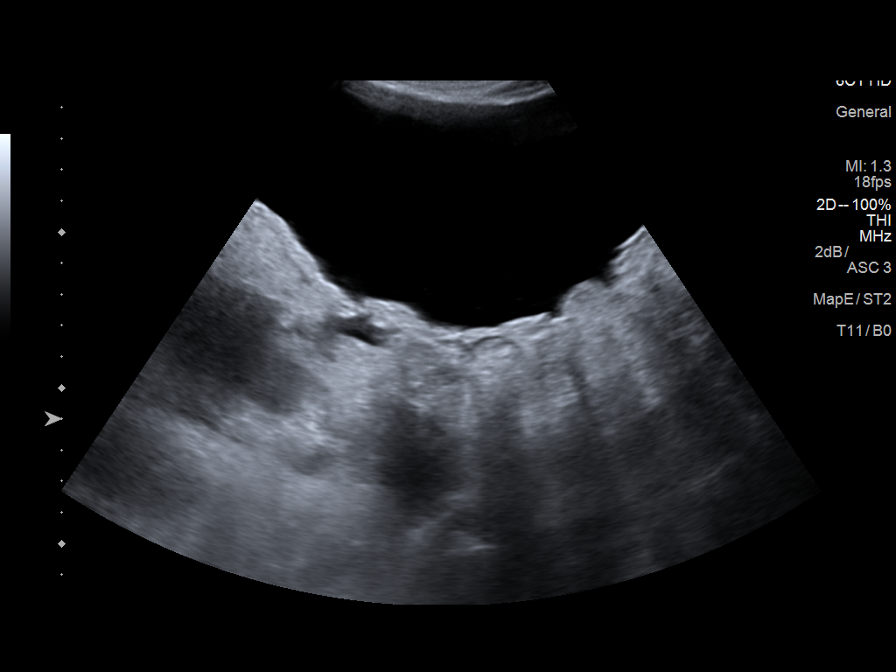
[im 4/8]
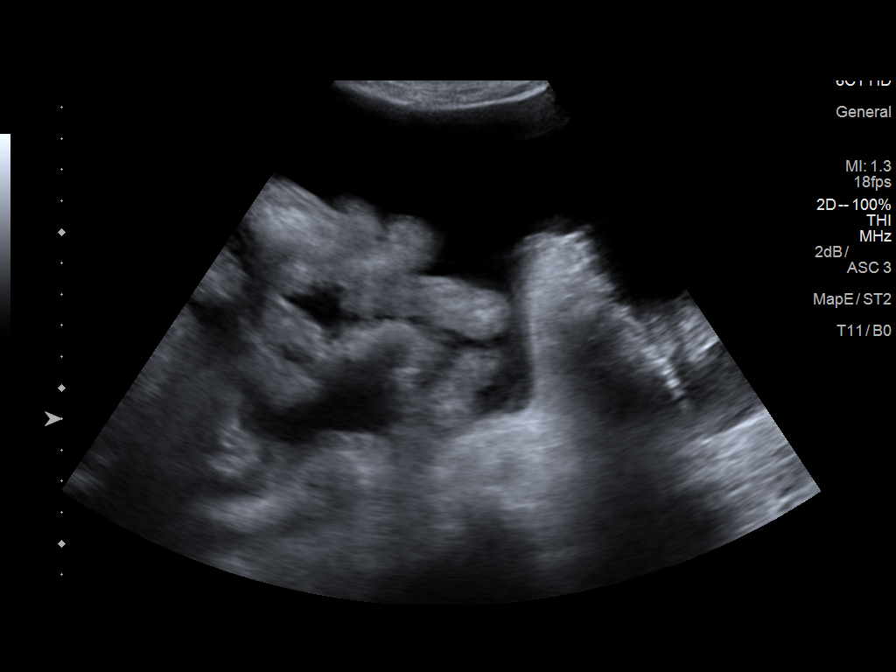
[im 5/8]
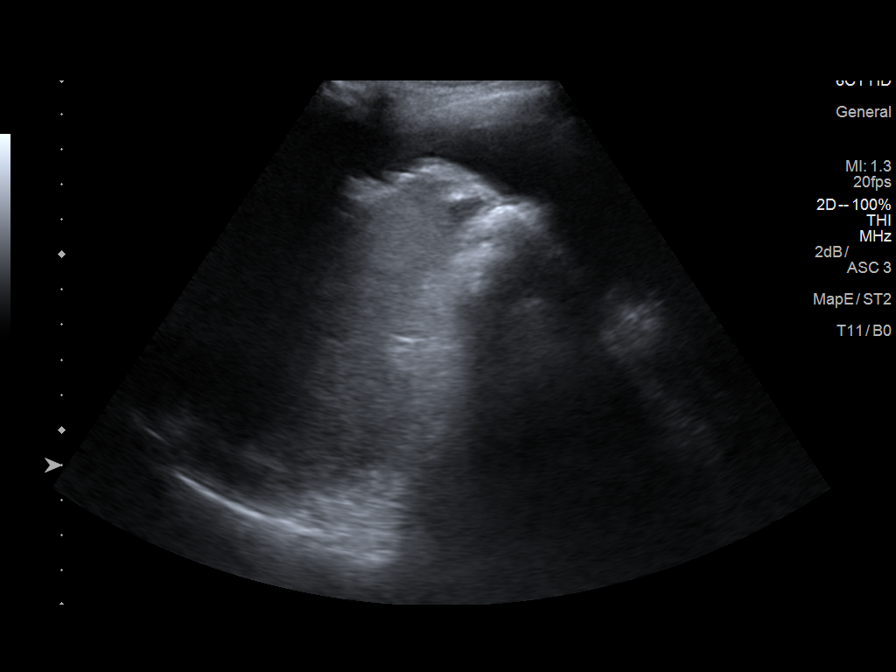
[im 6/8]
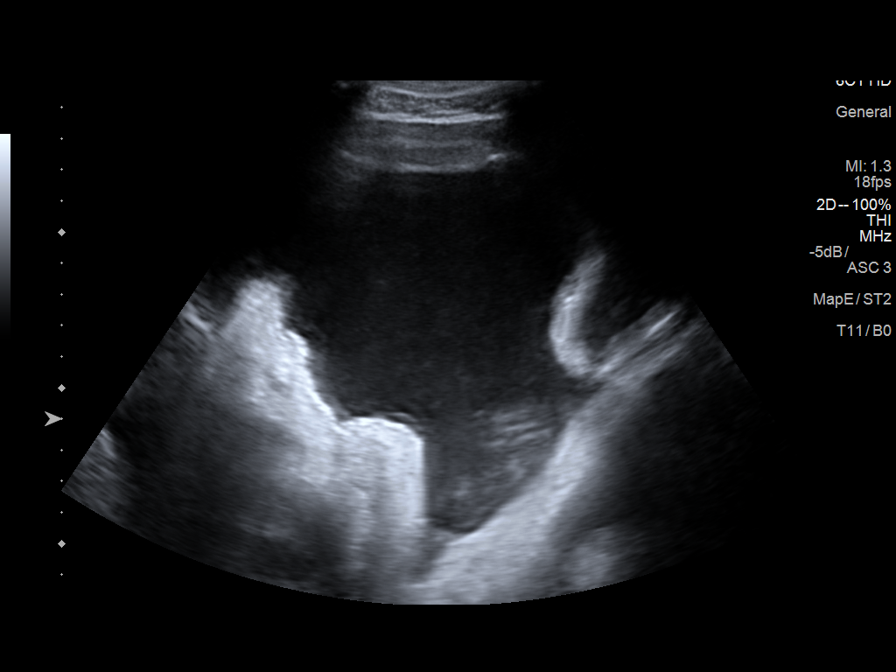
[im 7/8]
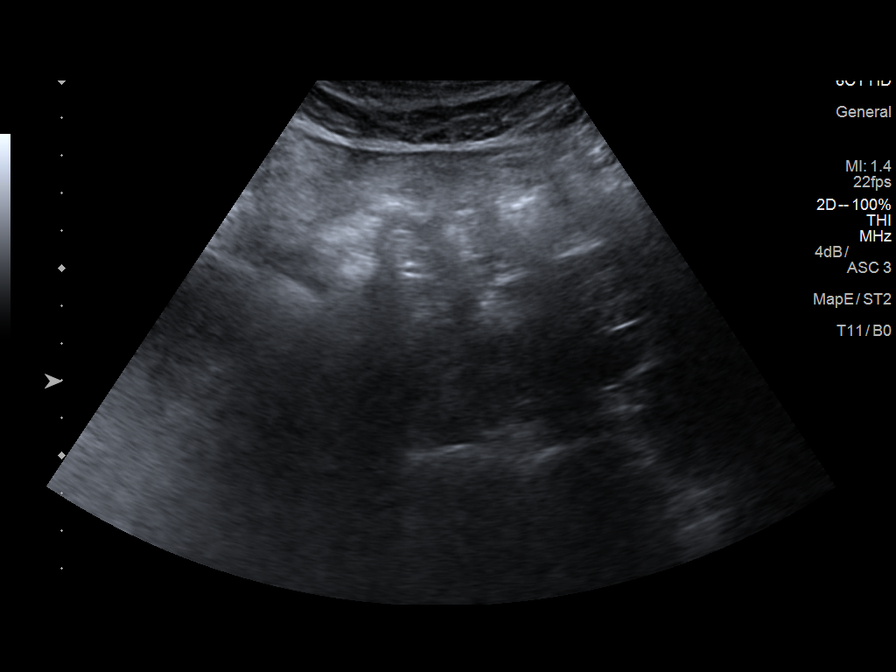
[im 8/8]
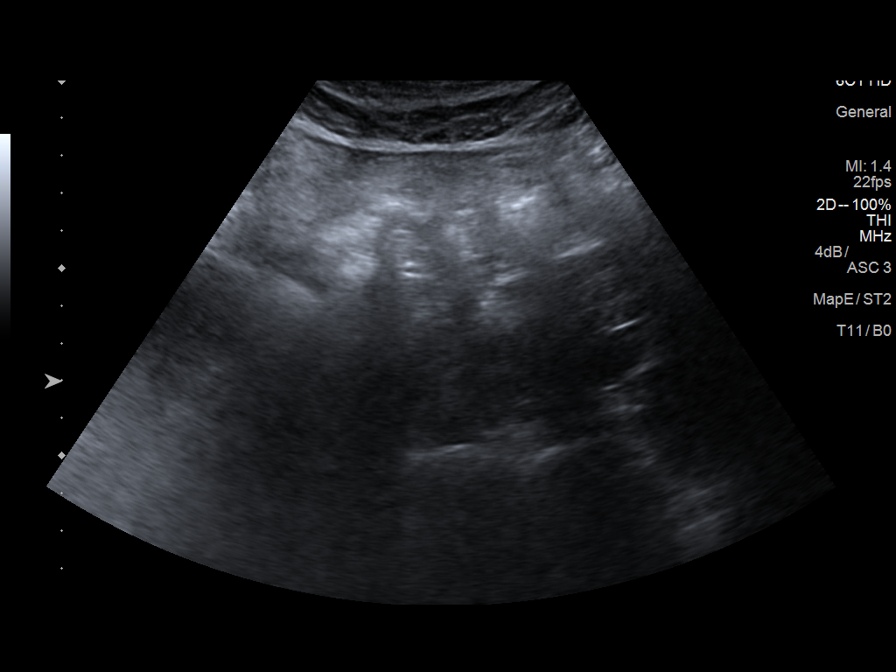

[8 of 8 positions shown; findings below may reference images not displayed]

EXAM:
ULTRASOUND GUIDED DIAGNOSTIC AND THERAPEUTIC PARACENTESIS

MEDICATIONS:
10 mL 1% lidocaine

COMPLICATIONS:
None immediate.

PROCEDURE:
Informed written consent was obtained from the patient after a
discussion of the risks, benefits and alternatives to treatment. A
timeout was performed prior to the initiation of the procedure.

Initial ultrasound scanning demonstrates a large amount of ascites
within the right lateral abdomen. The right lateral abdomen was
prepped and draped in the usual sterile fashion. 1% lidocaine was
used for local anesthesia.

Following this, a 19 gauge, 7-cm, Yueh catheter was introduced. An
ultrasound image was saved for documentation purposes. The
paracentesis was performed. The catheter was removed and a dressing
was applied. The patient tolerated the procedure well without
immediate post procedural complication.
FINDINGS: A total of approximately 5.8 liters of clear, yellow fluid was
removed. Samples were sent to the laboratory as requested by the
clinical team.
IMPRESSION: Successful ultrasound-guided paracentesis yielding 5.8 liters of
peritoneal fluid.

## 2018-01-23 ENCOUNTER — Ambulatory Visit
Admission: RE | Admit: 2018-01-23 | Discharge: 2018-01-23 | Disposition: A | Discharge status: Home / Self Care | Payer: Medicare Other | Source: Ambulatory Visit | Attending: Interventional Radiology | Admitting: Interventional Radiology

## 2018-01-23 ENCOUNTER — Other Ambulatory Visit: Payer: Self-pay | Admitting: Family Medicine

## 2018-01-23 ENCOUNTER — Encounter: Payer: Self-pay | Admitting: Radiology

## 2018-01-23 DIAGNOSIS — Z95828 Presence of other vascular implants and grafts: Secondary | ICD-10-CM

## 2018-01-23 DIAGNOSIS — R188 Other ascites: Secondary | ICD-10-CM | POA: Diagnosis not present

## 2018-01-23 DIAGNOSIS — Z8719 Personal history of other diseases of the digestive system: Secondary | ICD-10-CM | POA: Diagnosis not present

## 2018-01-23 HISTORY — PX: IR RADIOLOGIST EVAL & MGMT: IMG5224

## 2018-01-23 NOTE — Progress Notes (Signed)
Patient ID: Donald Moreno, male   DOB: [birth year], 76 y.o.   MRN: [id number]       Chief Complaint: Patient was seen in consultation today for Follow-up tips at the request of Donald Memoli  Referring Physician(s): Ona Rathert  History of Present Illness: Donald Moreno is a 77 y.o. male Who underwent TIPS creation 10/11/2016 due to recurrent large volume abdominal ascites secondary to cirrhosis and portal venous hypertension.  He has had no need for paracentesis since his previous follow-up visit last year.  His abdomen does not feel distended.  He maintains his weight using Lasix as needed for fluid balance.  His appetite remains good.  His energy is good.  He continues on lactulose without significant side effect or problem.  Past Medical History:  Diagnosis Date  . Acute congestive heart failure ([place])   . Arthritis    "all over"  . Asthma   . Blood in stool 04/11/2017  . Chronic kidney disease    "I see a kidney dr @ [facility]" (10/11/2016)  . Chronic lower back pain    "turned a truck over a long time ago" (10/11/2016)  . Dysrhythmia    bradycardia with occassional junctional rhythm  . ETOH abuse   . Gout   . Hypertension   . Inguinal hernia 10/28/2014   S/P inguinal hernia repair with 4L as ascites removed 03/30/17  . Liver cirrhosis ([place]) 2015  . Macrocytosis   . Pneumonia    "long long time ago" (10/11/2016)  . Spontaneous bacterial peritonitis ([place]) 07/01/2015    Past Surgical History:  Procedure Laterality Date  . INGUINAL HERNIA REPAIR Bilateral 03/30/2017   Procedure: OPEN BILATERAL INGUINAL HERNIA REPAIR WITH MESH;  Surgeon: Kinsinger, Arta Bruce, MD;  Location: [facility];  Service: General;  Laterality: Bilateral;  GENERAL COMBINED WITH REGIONAL FOR POST OP PAIN   . INSERTION OF MESH N/A 10/16/2016   Procedure: INSERTION OF MESH;  Surgeon: Kinsinger, Arta Bruce, MD;  Location: [place];  Service: General;  Laterality: N/A;  . INSERTION OF MESH Bilateral  03/30/2017   Procedure: INSERTION OF MESH;  Surgeon: Kieth Brightly Arta Bruce, MD;  Location: [facility];  Service: General;  Laterality: Bilateral;  GENERAL COMBINED WITH REGIONAL FOR POST OP PAIN   . IR PARACENTESIS  08/19/2016  . IR PARACENTESIS  08/26/2016  . IR PARACENTESIS  09/02/2016  . IR PARACENTESIS  09/09/2016  . IR PARACENTESIS  09/16/2016  . IR PARACENTESIS  10/03/2016  . IR PARACENTESIS  10/11/2016  . IR RADIOLOGIST EVAL & MGMT  09/29/2016  . IR RADIOLOGIST EVAL & MGMT  12/21/2016  . IR RADIOLOGIST EVAL & MGMT  01/23/2018  . IR TIPS  10/11/2016  . RADIOLOGY WITH ANESTHESIA N/A 10/11/2016   Procedure: TIPS;  Surgeon: Arne Cleveland, MD;  Location: [place];  Service: Radiology;  Laterality: N/A;  . UMBILICAL HERNIA REPAIR N/A 10/16/2016   Procedure: HERNIA REPAIR UMBILICAL ADULT;  Surgeon: Kinsinger, Arta Bruce, MD;  Location: [place];  Service: General;  Laterality: N/A;    Allergies: Patient has no known allergies.  Medications: Prior to Admission medications   Medication Sig Start Date End Date Taking? Authorizing Provider  allopurinol (ZYLOPRIM) 300 MG tablet TAKE 1 TABLET BY MOUTH EVERY DAY 10/13/17   Shirley, [place], DO  bisoprolol (ZEBETA) 5 MG tablet Take 0.5 tablets (2.5 mg total) by mouth daily. 11/24/17   Shirley, [place], DO  Cholecalciferol (VITAMIN D-3) 5000 units TABS Take 5,000 Units daily by mouth.  [provider]  diclofenac sodium (VOLTAREN) 1 % GEL APPLY 2 GRAMS TO AFFECTED AREA 4 TIMES A DAY 01/10/18   Shirley, [place], DO  ferrous sulfate 325 (65 FE) MG tablet Take 1 tablet (325 mg total) by mouth 2 (two) times daily with a meal. 10/21/14   Gottschalk, Ashly M, DO  Fluticasone-Salmeterol (WIXELA INHUB) 500-50 MCG/DOSE AEPB Inhale 1 puff into the lungs 2 (two) times daily. 10/27/17   Shirley, [place], DO  furosemide (LASIX) 40 MG tablet Take 1.5 tablets (60 mg total) by mouth 3 (three) times daily. Take 60 mg 1-3 times daily based on daily weight measurements. 08/11/17   Everrett Coombe, MD  furosemide (LASIX) 40 MG tablet 1 TAB 3 TIMES A DAY FOR WT >168LB, 2 TIMES A DAY FOR WT 163-168LB, ONCE DAILY FOR WT <163LB 01/08/18   Enid Derry, [place], DO  lactulose (CHRONULAC) 10 GM/15ML solution TAKE 15 MLS BY MOUTH 2 (TWO) TIMES DAILY AS NEEDED (NEED TO ENSURE 2 SOFT BM IN A DAY.). 01/08/18   Shirley, [place], DO  montelukast (SINGULAIR) 10 MG tablet TAKE 1 TABLET BY MOUTH EVERYDAY AT BEDTIME 01/23/18   Shirley, [place], DO  Multiple Vitamin (MULTIVITAMIN WITH MINERALS) TABS tablet Take 1 tablet by mouth daily. 04/18/17   Shirley, [place], DO  Nutritional Supplements (ENSURE COMPLETE SHAKE) LIQD Take 1 Container by mouth daily. Patient taking differently: Take 1 Container 2 (two) times daily by mouth.  03/04/15   Katheren Shams, DO  PROAIR HFA 108 [phone] Base) MCG/ACT inhaler Use 2 puffs every 4 hours  as needed for wheezing or  shortness of breath 11/26/15   Haney, Yetta Flock A, MD  spironolactone (ALDACTONE) 25 MG tablet Take 1 tablet (25 mg total) by mouth daily. 06/01/17   Shirley, [place], DO  spironolactone (ALDACTONE) 25 MG tablet TAKE 1 TABLET BY MOUTH EVERY DAY 11/22/17   Shirley, [place], DO     Family History  Problem Relation Age of Onset  . Other Mother        died when pt was only 63 - unknown cause.  . Other Father        deceased.  . Asthma Sister   . Heart disease Sister        multiple stents.  . Asthma Brother   . Hypertension Brother   . Asthma Son   . Asthma Brother   . Hypertension Brother     Social History   Socioeconomic History  . Marital status: Divorced    Spouse name: Not on file  . Number of children: 5  . Years of education: 10  . Highest education level: Not on file  Occupational History  . 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 Financial resource strain: Not on file  . Food insecurity:    Worry: Not on file    Inability: Not on file  . Transportation needs:    Medical: Not on file    Non-medical: Not on file  Tobacco Use  .  Smoking status: Never Smoker  . Smokeless tobacco: Current User    Types: Snuff, Chew  Substance and Sexual Activity  . Alcohol use: No    Comment: quit drinking "over 2 yr ago" (10/11/2016)  . Drug use: No  . Sexual activity: Not Currently  Lifestyle  . Physical activity:    Days per week: Not on file    Minutes per session: Not on file  . Stress: Not on file  Relationships  . Social connections:  Talks on phone: Not on file    Gets together: Not on file    Attends religious service: Not on file    Active member of club or organization: Not on file    Attends meetings of clubs or organizations: Not on file    Relationship status: Not on file  Other Topics Concern  . Not on file  Social History Narrative   Lives with nephew and niece in [place] home.     Tobacco: snuff all day (can last two days. Never smoked.       Hobbies: cooking, sleeping, going to store   Pets:  Dog, Bean    ECOG Status: 1 - Symptomatic but completely ambulatory    Physical Exam Vital Signs: BP 124/73   Pulse 75   Temp 97.8 F (36.6 C) (Oral)   Resp 14   Ht 5\' 10"  (1.778 m)   Wt 73.5 kg   SpO2 98%   BMI 23.24 kg/m   Constitutional: Oriented to person, place, and time. Well-developed and well-nourished. No distress.  He maintains his usual good-natured attitude. Last Weight  Most recent update: 01/23/2018  8:43 AM   Weight  73.5 kg (162 lb)           HENT:  Head: Normocephalic and atraumatic.  Eyes: Conjunctivae and EOM are normal. Right eye exhibits no discharge. Left eye exhibits no discharge. No scleral icterus.  Neck: No JVD present.  Pulmonary/Chest: Effort normal. No stridor. No respiratory distress.  Abdomen: soft, non distended Neurological:  alert and oriented to person, place, and time.  Skin: Skin is warm and dry.  not diaphoretic.  Psychiatric:   normal mood and affect.   behavior is normal. Judgment and thought content normal.    Review of Systems Review of Systems:  A 12 point ROS discussed and pertinent positives are indicated in the HPI above.  All other systems are negative.       Imaging: US Abdominal Pelvic Art/vent Flow Doppler  Result Date: 01/23/2018 CLINICAL DATA:  Cirrhosis, recurrent ascites, status post TIPS creation 10/11/2016. Routine surveillance follow-up EXAM: DUPLEX ULTRASOUND OF LIVER AND TIPS SHUNT TECHNIQUE: Color and duplex Doppler ultrasound was performed to evaluate the hepatic in-flow and out-flow vessels. COMPARISON:  08/31/2017 FINDINGS: TIPS Shunt Velocities (all antegrade): Proximal:  81 cm/sec Mid:  111 Distal:  81 cm/sec Portal Vein Velocities (all hepatopetal): Main:  32 cm/sec Right:  31 cm/sec Left:  40 cm/sec Intrahepatic IVC patent, 65 cm/seconds Hepatic Vein Velocities (all hepatofugal): Right:  81 cm/sec Middle:  26 cm/sec Left:  28 cm/sec Hepatic Artery Velocity: 87 cm/sec Splenic Vein Velocity: 47 cm/sec SMA: 60 cm/sec Varices: None identified Ascites: Minimal, Morrison's pouch Other findings: 1.5 cm hepatic cyst, stable IMPRESSION: 1. Widely patent TIPS without  narrowing or thrombus. 2. Continued antegrade portal vein flow. Electronically Signed   By: Lucrezia Europe M.D.   On: 01/23/2018 12:09   Ir Radiologist Eval & Mgmt  Result Date: 01/23/2018 Please refer to notes tab for details about interventional procedure. (Op Note)   Labs:  CBC: Recent Labs    04/11/17 2351 04/13/17 0438 07/11/17 1127 11/24/17 1516 12/29/17 2150  WBC 7.2  --  6.4 7.8 7.9  HGB 11.5*  --  11.4* 11.5* 12.9*  HCT 34.8* 31.9* 33.2* 33.1* 38.5*  PLT 125*  --  164 144* 143*    COAGS: Recent Labs    03/28/17 1138 12/29/17 2150  INR 1.35 1.13  APTT 41*  --  BMP: Recent Labs    09/18/17 1227 10/27/17 1418 11/24/17 1507 12/29/17 2150  NA 135 137 132* 133*  K 4.7 4.8 4.8 4.2  CL 100 99 97 99  CO2 19* 23 20 24   GLUCOSE 94 84 75 111*  BUN 39* 34* 31* 29*  CALCIUM 9.2 10.4* 9.9 9.5  CREATININE 2.07* 1.94* 1.88* 2.33*    GFRNONAA 30* 33* 34* 26*  GFRAA 35* 38* 40* 30*    LIVER FUNCTION TESTS: Recent Labs    03/28/17 1138 04/11/17 2050 11/24/17 1507 12/29/17 2150  BILITOT 1.7* 1.4* 1.2 1.7*  AST 40 39 36 46*  ALT 23 22 18  32  ALKPHOS 84 91 95 89  PROT 7.5 7.4 7.8 7.9  ALBUMIN 3.4* 2.9* 3.8 3.5    TUMOR MARKERS: No results for input(s): AFPTM, CEA, CA199, CHROMGRNA in the last 8760 hours.  Assessment and Plan:  My impression is that he has done great since his previous visit.  No need for paracentesis for over a year.  TIPS looks great on ultrasound.  I think this will be a durable situation for him.  We will plan to continue to see him on a yearly basis for surveillance ultrasound.  He knows to call should he have any sensation of abdominal distention or other issues concerning the TIPS or portal venous hypertension.   Thank you for this interesting consult.  I greatly enjoyed meeting Donald Moreno and look forward to participating in their care.  A copy of this report was sent to the requesting provider on this date.  Electronically Signed: Rickard Rhymes 01/23/2018, 1:07 PM   I spent a total of    15 Minutes in face to face in clinical consultation, greater than 50% of which was counseling/coordinating care for follow-up TIPS for ascites.

## 2018-01-29 ENCOUNTER — Ambulatory Visit (INDEPENDENT_AMBULATORY_CARE_PROVIDER_SITE_OTHER): Payer: Medicare Other | Admitting: Family Medicine

## 2018-01-29 ENCOUNTER — Encounter: Payer: Self-pay | Admitting: Family Medicine

## 2018-01-29 VITALS — BP 134/80 | HR 74 | Temp 98.5°F | Ht 71.0 in | Wt 164.0 lb

## 2018-01-29 DIAGNOSIS — R319 Hematuria, unspecified: Secondary | ICD-10-CM | POA: Diagnosis not present

## 2018-01-29 DIAGNOSIS — I509 Heart failure, unspecified: Secondary | ICD-10-CM

## 2018-01-29 DIAGNOSIS — Z23 Encounter for immunization: Secondary | ICD-10-CM | POA: Diagnosis not present

## 2018-01-29 LAB — POCT URINALYSIS DIP (MANUAL ENTRY)
Bilirubin, UA: NEGATIVE
Blood, UA: NEGATIVE
GLUCOSE UA: NEGATIVE mg/dL
Ketones, POC UA: NEGATIVE mg/dL
Leukocytes, UA: NEGATIVE
NITRITE UA: NEGATIVE
PH UA: 7 (ref 5.0–8.0)
Protein Ur, POC: NEGATIVE mg/dL
Spec Grav, UA: 1.01 (ref 1.010–1.025)
UROBILINOGEN UA: 1 U/dL

## 2018-01-29 MED ORDER — SPIRONOLACTONE 25 MG PO TABS: 25.0000 mg | ORAL_TABLET | Freq: Every day | ORAL | 0 refills | Status: DC

## 2018-01-29 NOTE — Progress Notes (Signed)
   Subjective:    Patient ID: Donald Moreno, male    DOB: 1940-10-08, 77 y.o.   MRN: [id number]   CC: CHF  HPI: CHF: - last seen in the office on7/12 and continuingweight based plan:lasix tid for wt >168lb, lasix bid for wt 163-168lb, and lasix once daily for wt <163lb. - patient has been followingthis regimen as he has previously - he weighed164lbthis morning on his home scale - patient reports still having normal urine output, states he goes all the time- did have hematuria and was seen by a urologist who treated him for a UTI - also states that he is feeling great and walking everyday like he usually does - takes spironolactone daily  -PIR:JJOACZ scrotal swelling, uses 2 pillows at night which is his normal, and denies any leg swelling, reports some breast swelling, but it is not causing an trouble  Hematuria: - Patient had gross hematuria and was seen in ED on 08/16. They sent him home with abx and a referral to a urologist.  - Patient saw urology who changed his abx, although he is unsure what it was hanged to - He finished the course of abx with minimal side effects - ROS: no dysuria or hematuria    Patient wants to take a trip to [place] in January/February to see family, will see me prior to his trip.   Smoking status reviewed  ROS: 10 point ROS is otherwise negative, except as mentioned in HPI  Patient Active Problem List   Diagnosis Date Noted  . Corn or callus 07/11/2017  . Macrocytic anemia 04/20/2017  . Cirrhosis of liver with ascites ([place])   . Congestive heart failure ([place])   . Hematuria 01/30/2017  . Alcoholic cirrhosis of liver with ascites ([place])   . Typical atrial flutter ([place])   . CKD (chronic kidney disease) stage 3, GFR 30-59 ml/min ([facility]) 05/27/2015  . At risk for decreased bone density 03/04/2015  . Bradycardia 07/08/2013  . Asthma 01/29/2010  . Gout 07/13/2006  . HYPERTENSION, BENIGN SYSTEMIC 07/13/2006  . Arthritis 07/13/2006      Objective:  BP 134/80   Pulse 74   Temp 98.5 F (36.9 C) (Oral)   Ht 5\' 11"  (1.803 m)   Wt 164 lb (74.4 kg)   BMI 22.87 kg/m  Vitals and nursing note reviewed  General: NAD, pleasant Cardiac: RRR, normal heart sounds, no murmurs Respiratory: CTAB, normal effort Abdomen: soft, nontender, nondistended Extremities: no edema or cyanosis. WWP. Skin: warm and dry, no rashes noted Neuro: alert and oriented, no focal deficits Psych: normal affect  Assessment & Plan:    Congestive heart failure ([place]) Patient euvolemic today. BP 134/80 and has been this low before and patient asymptomatic. Will continue to monitor. Continue his weight based treatment, compression stockings, and low salt diet.  - BMP today as he had elevated Cr in ED on 8/16 and is on many nephrotoxic agents - Continue current medications - Refill spironolactone  Hematuria Patient with UTI treated in ED and then changed abx by urology. Patient no longer with gross hematuria and repeat UA today with no hematuria. Patient asymptomatic.   [place] Anabel Lykins, DO Family Medicine Resident PGY-2

## 2018-01-29 NOTE — Patient Instructions (Signed)
Thank you for coming to see me today. It was a pleasure! Today we talked about:   Continue taking your medications the same as you are now. I will call you with you blood work and let you know if you need to make any adjustments.   Please follow-up with me in 3 months or sooner as needed.  If you have any questions or concerns, please do not hesitate to call the office at [phone].  Take Care,   [place] Kailynn Satterly, DO

## 2018-01-30 LAB — BASIC METABOLIC PANEL
BUN/Creatinine Ratio: 10 (ref 10–24)
BUN: 16 mg/dL (ref 8–27)
CO2: 26 mmol/L (ref 20–29)
CREATININE: 1.57 mg/dL — AB (ref 0.76–1.27)
Calcium: 10.1 mg/dL (ref 8.6–10.2)
Chloride: 100 mmol/L (ref 96–106)
GFR calc Af Amer: 49 mL/min/{1.73_m2} — ABNORMAL LOW (ref >59)
GFR calc non Af Amer: 42 mL/min/{1.73_m2} — ABNORMAL LOW (ref >59)
GLUCOSE: 99 mg/dL (ref 65–99)
Potassium: 4 mmol/L (ref 3.5–5.2)
Sodium: 144 mmol/L (ref 134–144)

## 2018-01-30 NOTE — Assessment & Plan Note (Signed)
Patient with UTI treated in ED and then changed abx by urology. Patient no longer with gross hematuria and repeat UA today with no hematuria. Patient asymptomatic.

## 2018-01-30 NOTE — Assessment & Plan Note (Signed)
Patient euvolemic today. BP 134/80 and has been this low before and patient asymptomatic. Will continue to monitor. Continue his weight based treatment, compression stockings, and low salt diet.  - BMP today as he had elevated Cr in ED on 8/16 and is on many nephrotoxic agents - Continue current medications - Refill spironolactone

## 2018-02-07 ENCOUNTER — Other Ambulatory Visit: Payer: Self-pay | Admitting: Family Medicine

## 2018-02-07 DIAGNOSIS — I509 Heart failure, unspecified: Secondary | ICD-10-CM

## 2018-02-12 DIAGNOSIS — N183 Chronic kidney disease, stage 3 (moderate): Secondary | ICD-10-CM | POA: Diagnosis not present

## 2018-02-12 DIAGNOSIS — D631 Anemia in chronic kidney disease: Secondary | ICD-10-CM | POA: Diagnosis not present

## 2018-02-12 DIAGNOSIS — I129 Hypertensive chronic kidney disease with stage 1 through stage 4 chronic kidney disease, or unspecified chronic kidney disease: Secondary | ICD-10-CM | POA: Diagnosis not present

## 2018-02-12 DIAGNOSIS — N2581 Secondary hyperparathyroidism of renal origin: Secondary | ICD-10-CM | POA: Diagnosis not present

## 2018-02-19 ENCOUNTER — Emergency Department (HOSPITAL_COMMUNITY)
Admission: EM | Admit: 2018-02-19 | Discharge: 2018-02-19 | Disposition: A | Discharge status: Home / Self Care | Payer: Medicare Other | Attending: Emergency Medicine | Admitting: Emergency Medicine

## 2018-02-19 ENCOUNTER — Telehealth (HOSPITAL_COMMUNITY): Payer: Self-pay | Admitting: *Deleted

## 2018-02-19 ENCOUNTER — Encounter (HOSPITAL_COMMUNITY): Payer: Self-pay | Admitting: Emergency Medicine

## 2018-02-19 ENCOUNTER — Other Ambulatory Visit: Payer: Self-pay

## 2018-02-19 DIAGNOSIS — R748 Abnormal levels of other serum enzymes: Secondary | ICD-10-CM | POA: Diagnosis not present

## 2018-02-19 DIAGNOSIS — Z79899 Other long term (current) drug therapy: Secondary | ICD-10-CM | POA: Insufficient documentation

## 2018-02-19 DIAGNOSIS — I130 Hypertensive heart and chronic kidney disease with heart failure and stage 1 through stage 4 chronic kidney disease, or unspecified chronic kidney disease: Secondary | ICD-10-CM | POA: Diagnosis not present

## 2018-02-19 DIAGNOSIS — R17 Unspecified jaundice: Secondary | ICD-10-CM

## 2018-02-19 DIAGNOSIS — I509 Heart failure, unspecified: Secondary | ICD-10-CM | POA: Insufficient documentation

## 2018-02-19 DIAGNOSIS — R1011 Right upper quadrant pain: Secondary | ICD-10-CM | POA: Diagnosis not present

## 2018-02-19 DIAGNOSIS — D539 Nutritional anemia, unspecified: Secondary | ICD-10-CM | POA: Insufficient documentation

## 2018-02-19 DIAGNOSIS — N289 Disorder of kidney and ureter, unspecified: Secondary | ICD-10-CM

## 2018-02-19 DIAGNOSIS — N183 Chronic kidney disease, stage 3 (moderate): Secondary | ICD-10-CM | POA: Diagnosis not present

## 2018-02-19 DIAGNOSIS — I483 Typical atrial flutter: Secondary | ICD-10-CM

## 2018-02-19 DIAGNOSIS — R1010 Upper abdominal pain, unspecified: Secondary | ICD-10-CM

## 2018-02-19 LAB — CBC WITH DIFFERENTIAL/PLATELET
Abs Immature Granulocytes: 0 10*3/uL (ref 0.0–0.1)
BASOS PCT: 1 %
Basophils Absolute: 0.1 10*3/uL (ref 0.0–0.1)
Eosinophils Absolute: 0.2 10*3/uL (ref 0.0–0.7)
Eosinophils Relative: 3 %
HCT: 34.9 % — ABNORMAL LOW (ref 39.0–52.0)
Hemoglobin: 11.5 g/dL — ABNORMAL LOW (ref 13.0–17.0)
IMMATURE GRANULOCYTES: 0 %
LYMPHS ABS: 1.9 10*3/uL (ref 0.7–4.0)
Lymphocytes Relative: 28 %
MCH: 35.2 pg — ABNORMAL HIGH (ref 26.0–34.0)
MCHC: 33 g/dL (ref 30.0–36.0)
MCV: 106.7 fL — ABNORMAL HIGH (ref 78.0–100.0)
MONOS PCT: 9 %
Monocytes Absolute: 0.6 10*3/uL (ref 0.1–1.0)
NEUTROS ABS: 3.8 10*3/uL (ref 1.7–7.7)
NEUTROS PCT: 59 %
PLATELETS: 157 10*3/uL (ref 150–400)
RBC: 3.27 MIL/uL — AB (ref 4.22–5.81)
RDW: 13.1 % (ref 11.5–15.5)
WBC: 6.6 10*3/uL (ref 4.0–10.5)

## 2018-02-19 LAB — COMPREHENSIVE METABOLIC PANEL
ALT: 21 U/L (ref 0–44)
AST: 37 U/L (ref 15–41)
Albumin: 3.3 g/dL — ABNORMAL LOW (ref 3.5–5.0)
Alkaline Phosphatase: 90 U/L (ref 38–126)
Anion gap: 8 (ref 5–15)
BUN: 18 mg/dL (ref 8–23)
CHLORIDE: 105 mmol/L (ref 98–111)
CO2: 26 mmol/L (ref 22–32)
Calcium: 9.4 mg/dL (ref 8.9–10.3)
Creatinine, Ser: 1.63 mg/dL — ABNORMAL HIGH (ref 0.61–1.24)
GFR, EST AFRICAN AMERICAN: 46 mL/min — AB (ref >60)
GFR, EST NON AFRICAN AMERICAN: 40 mL/min — AB (ref >60)
Glucose, Bld: 127 mg/dL — ABNORMAL HIGH (ref 70–99)
POTASSIUM: 3.4 mmol/L — AB (ref 3.5–5.1)
Sodium: 139 mmol/L (ref 135–145)
TOTAL PROTEIN: 7.3 g/dL (ref 6.5–8.1)
Total Bilirubin: 1.5 mg/dL — ABNORMAL HIGH (ref 0.3–1.2)

## 2018-02-19 LAB — URINALYSIS, ROUTINE W REFLEX MICROSCOPIC
Bilirubin Urine: NEGATIVE
Glucose, UA: NEGATIVE mg/dL
KETONES UR: NEGATIVE mg/dL
Nitrite: NEGATIVE
PH: 5 (ref 5.0–8.0)
Protein, ur: NEGATIVE mg/dL
Specific Gravity, Urine: 1.012 (ref 1.005–1.030)

## 2018-02-19 LAB — TROPONIN I

## 2018-02-19 LAB — PROTIME-INR
INR: 1.28
Prothrombin Time: 15.9 seconds — ABNORMAL HIGH (ref 11.4–15.2)

## 2018-02-19 LAB — LIPASE, BLOOD: LIPASE: 113 U/L — AB (ref 11–51)

## 2018-02-19 MED ORDER — PANTOPRAZOLE SODIUM 40 MG PO TBEC
40.0000 mg | DELAYED_RELEASE_TABLET | Freq: Once | ORAL | Status: AC
  Administered 2018-02-19: 40 mg via ORAL
  Filled 2018-02-19: qty 1

## 2018-02-19 MED ORDER — GI COCKTAIL ~~LOC~~
30.0000 mL | Freq: Once | ORAL | Status: AC
  Administered 2018-02-19: 30 mL via ORAL
  Filled 2018-02-19: qty 30

## 2018-02-19 MED ORDER — PANTOPRAZOLE SODIUM 40 MG PO TBEC: 40.0000 mg | DELAYED_RELEASE_TABLET | Freq: Every day | ORAL | 0 refills | Status: DC

## 2018-02-19 NOTE — ED Triage Notes (Signed)
Pt reports gen abd pain since 2100, states pain radiates to his back. Denies N/V/D.

## 2018-02-19 NOTE — Telephone Encounter (Signed)
Pt scheduled  

## 2018-02-19 NOTE — Discharge Instructions (Signed)
Return if pain is getting worse.

## 2018-02-19 NOTE — ED Provider Notes (Signed)
[facility] EMERGENCY DEPARTMENT Provider Note   CSN: [id number] Arrival date & time: 02/19/18  0158     History   Chief Complaint Chief Complaint  Patient presents with  . Abdominal Pain    HPI Donald Moreno is a 77 y.o. male.  The history is provided by the patient.  He has history of hypertension, cirrhosis, atrial flutter, chronic kidney disease and comes in complaining of pain in his right upper abdomen which radiated to his back.  Pain started at 9 AM and is slowly abating.  He denies nausea or vomiting and denies constipation or diarrhea.  He is unable to characterize the pain and unable to put a number on it.  It is worse when he lays flat.  Nothing makes it better.  Past Medical History:  Diagnosis Date  . Acute congestive heart failure ([place])   . Arthritis    "all over"  . Asthma   . Blood in stool 04/11/2017  . Chronic kidney disease    "I see a kidney dr @ [facility]" (10/11/2016)  . Chronic lower back pain    "turned a truck over a long time ago" (10/11/2016)  . Dysrhythmia    bradycardia with occassional junctional rhythm  . ETOH abuse   . Gout   . Hypertension   . Inguinal hernia 10/28/2014   S/P inguinal hernia repair with 4L as ascites removed 03/30/17  . Liver cirrhosis ([place]) 2015  . Macrocytosis   . Pneumonia    "long long time ago" (10/11/2016)  . Spontaneous bacterial peritonitis ([place]) 07/01/2015    Patient Active Problem List   Diagnosis Date Noted  . Corn or callus 07/11/2017  . Macrocytic anemia 04/20/2017  . Cirrhosis of liver with ascites ([place])   . Congestive heart failure ([place])   . Hematuria 01/30/2017  . Alcoholic cirrhosis of liver with ascites ([place])   . Typical atrial flutter ([place])   . CKD (chronic kidney disease) stage 3, GFR 30-59 ml/min (HCC) 05/27/2015  . At risk for decreased bone density 03/04/2015  . Bradycardia 07/08/2013  . Asthma 01/29/2010  . Gout 07/13/2006  . HYPERTENSION, BENIGN SYSTEMIC  07/13/2006  . Arthritis 07/13/2006    Past Surgical History:  Procedure Laterality Date  . INGUINAL HERNIA REPAIR Bilateral 03/30/2017   Procedure: OPEN BILATERAL INGUINAL HERNIA REPAIR WITH MESH;  Surgeon: Kinsinger, Arta Bruce, MD;  Location: [facility];  Service: General;  Laterality: Bilateral;  GENERAL COMBINED WITH REGIONAL FOR POST OP PAIN   . INSERTION OF MESH N/A 10/16/2016   Procedure: INSERTION OF MESH;  Surgeon: Kinsinger, Arta Bruce, MD;  Location: [facility];  Service: General;  Laterality: N/A;  . INSERTION OF MESH Bilateral 03/30/2017   Procedure: INSERTION OF MESH;  Surgeon: Kieth Brightly Arta Bruce, MD;  [facility];  Service: General;  Laterality: Bilateral;  GENERAL COMBINED WITH REGIONAL FOR POST OP PAIN   . IR PARACENTESIS  08/19/2016  . IR PARACENTESIS  08/26/2016  . IR PARACENTESIS  09/02/2016  . IR PARACENTESIS  09/09/2016  . IR PARACENTESIS  09/16/2016  . IR PARACENTESIS  10/03/2016  . IR PARACENTESIS  10/11/2016  . IR RADIOLOGIST EVAL & MGMT  09/29/2016  . IR RADIOLOGIST EVAL & MGMT  12/21/2016  . IR RADIOLOGIST EVAL & MGMT  01/23/2018  . IR TIPS  10/11/2016  . RADIOLOGY WITH ANESTHESIA N/A 10/11/2016   Procedure: TIPS;  Surgeon: Arne Cleveland, MD;  Location: [facility];  Service: Radiology;  Laterality: N/A;  .  UMBILICAL HERNIA REPAIR N/A 10/16/2016   Procedure: HERNIA REPAIR UMBILICAL ADULT;  Surgeon: Kinsinger, Arta Bruce, MD;  Location: [facility];  Service: General;  Laterality: N/A;        Home Medications    Prior to Admission medications   Medication Sig Start Date End Date Taking? Authorizing Provider  allopurinol (ZYLOPRIM) 300 MG tablet TAKE 1 TABLET BY MOUTH EVERY DAY 10/13/17   Shirley, [place], DO  bisoprolol (ZEBETA) 5 MG tablet Take 0.5 tablets (2.5 mg total) by mouth daily. 11/24/17   Shirley, [place], DO  Cholecalciferol (VITAMIN D-3) 5000 units TABS Take 5,000 Units daily by mouth.     [provider]  diclofenac sodium (VOLTAREN) 1 % GEL APPLY 2 GRAMS TO  AFFECTED AREA 4 TIMES A DAY 01/10/18   Shirley, [place], DO  ferrous sulfate 325 (65 FE) MG tablet Take 1 tablet (325 mg total) by mouth 2 (two) times daily with a meal. 10/21/14   Gottschalk, Ashly M, DO  Fluticasone-Salmeterol (WIXELA INHUB) 500-50 MCG/DOSE AEPB Inhale 1 puff into the lungs 2 (two) times daily. 10/27/17   Shirley, [place], DO  furosemide (LASIX) 40 MG tablet Take 1.5 tablets (60 mg total) by mouth 3 (three) times daily. Take 60 mg 1-3 times daily based on daily weight measurements. 08/11/17   Everrett Coombe, MD  furosemide (LASIX) 40 MG tablet 1 TAB 3 TIMES A DAY FOR WT >168LB, 2 TIMES A DAY FOR WT 163-168LB, ONCE DAILY FOR WT <163LB 02/07/18   Enid Derry, [place], DO  lactulose (CHRONULAC) 10 GM/15ML solution TAKE 15 MLS BY MOUTH 2 (TWO) TIMES DAILY AS NEEDED (NEED TO ENSURE 2 SOFT BM IN A DAY.). 01/08/18   Shirley, [place], DO  montelukast (SINGULAIR) 10 MG tablet TAKE 1 TABLET BY MOUTH EVERYDAY AT BEDTIME 01/23/18   Shirley, [place], DO  Multiple Vitamin (MULTIVITAMIN WITH MINERALS) TABS tablet Take 1 tablet by mouth daily. 04/18/17   Shirley, [place], DO  Nutritional Supplements (ENSURE COMPLETE SHAKE) LIQD Take 1 Container by mouth daily. Patient taking differently: Take 1 Container 2 (two) times daily by mouth.  03/04/15   Katheren Shams, DO  PROAIR HFA 108 [phone] Base) MCG/ACT inhaler Use 2 puffs every 4 hours  as needed for wheezing or  shortness of breath 11/26/15   Marina Goodell A, MD  spironolactone (ALDACTONE) 25 MG tablet TAKE 1 TABLET BY MOUTH EVERY DAY 11/22/17   Shirley, [place], DO  spironolactone (ALDACTONE) 25 MG tablet Take 1 tablet (25 mg total) by mouth daily. 01/29/18   Shirley, [place], DO    Family History Family History  Problem Relation Age of Onset  . Other Mother        died when pt was only 49 - unknown cause.  . Other Father        deceased.  . Asthma Sister   . Heart disease Sister        multiple stents.  . Asthma Brother   . Hypertension Brother   . Asthma Son     . Asthma Brother   . Hypertension Brother     Social History Social History   Tobacco Use  . Smoking status: Never Smoker  . Smokeless tobacco: Current User    Types: Snuff, Chew  Substance Use Topics  . Alcohol use: No    Comment: quit drinking "over 2 yr ago" (10/11/2016)  . Drug use: No     Allergies   Patient has no known allergies.   Review of Systems Review of  Systems  All other systems reviewed and are negative.    Physical Exam Updated Vital Signs BP (!) 150/83 (BP Location: Right Arm)   Pulse 61   Temp 97.6 F (36.4 C) (Oral)   Resp 20   SpO2 99%   Physical Exam  Nursing note and vitals reviewed.  77 year old male, resting comfortably and in no acute distress. Vital signs are significant for elevated systolic blood pressure. Oxygen saturation is 99%, which is normal. Head is normocephalic and atraumatic. PERRLA, EOMI. Oropharynx is clear. Neck is nontender and supple without adenopathy or JVD. Back is nontender and there is no CVA tenderness. Lungs are clear without rales, wheezes, or rhonchi. Chest is nontender. Heart has regular rate and rhythm without murmur. Abdomen is soft, flat, nontender without masses or hepatosplenomegaly and peristalsis is normoactive. Extremities have no cyanosis or edema, full range of motion is present. Skin is warm and dry without rash. Neurologic: Mental status is normal, cranial nerves are intact, there are no motor or sensory deficits.  ED Treatments / Results  Labs (all labs ordered are listed, but only abnormal results are displayed) Labs Reviewed  LIPASE, BLOOD - Abnormal; Notable for the following components:      Result Value   Lipase 113 (*)    All other components within normal limits  COMPREHENSIVE METABOLIC PANEL - Abnormal; Notable for the following components:   Potassium 3.4 (*)    Glucose, Bld 127 (*)    Creatinine, Ser 1.63 (*)    Albumin 3.3 (*)    Total Bilirubin 1.5 (*)    GFR calc non Af  Amer 40 (*)    GFR calc Af Amer 46 (*)    All other components within normal limits  URINALYSIS, ROUTINE W REFLEX MICROSCOPIC - Abnormal; Notable for the following components:   Hgb urine dipstick SMALL (*)    Leukocytes, UA TRACE (*)    Bacteria, UA RARE (*)    All other components within normal limits  CBC WITH DIFFERENTIAL/PLATELET - Abnormal; Notable for the following components:   RBC 3.27 (*)    Hemoglobin 11.5 (*)    HCT 34.9 (*)    MCV 106.7 (*)    MCH 35.2 (*)    All other components within normal limits  PROTIME-INR - Abnormal; Notable for the following components:   Prothrombin Time 15.9 (*)    All other components within normal limits  TROPONIN I    EKG EKG Interpretation  Date/Time:  Monday February 19 2018 02:33:59 EDT Ventricular Rate:  62 PR Interval:    QRS Duration: 75 QT Interval:  473 QTC Calculation: 481 R Axis:   50 Text Interpretation:  Atrial flutter with predominant 4:1 AV block Borderline ST depression, inferior leads Borderline prolonged QT interval When compared with ECG of 04/11/2017, Atrial flutter has replaced Sinus bradycardia QT has lengthened Confirmed by Delora Fuel ([id number]) on 02/19/2018 2:40:05 AM                                                                                                 Radiology No results  found.  Procedures Procedures   Medications Ordered in ED Medications  gi cocktail (Maalox,Lidocaine,Donnatal) (30 mLs Oral Given 02/19/18 0257)  pantoprazole (PROTONIX) EC tablet 40 mg (40 mg Oral Given 02/19/18 0352)     Initial Impression / Assessment and Plan / ED Course  I have reviewed the triage vital signs and the nursing notes.  Pertinent labs & imaging results that were available during my care of the patient were reviewed by me and considered in my medical decision making (see chart for details).  Abdominal pain of uncertain cause.  Abdominal exam is benign and he does not appear to be in any distress.  I do not see  any indication for abdominal imaging at this point.  ECG shows he is in atrial flutter with a controlled ventricular response review of old records shows that he has been in atrial flutter in the past and had not been anticoagulated because of liver disease.  Pain worse with laying flat would go along with GERD.  Pain could also be related to change in cardiac rhythm.  Will check screening labs, including troponin.  Will give therapeutic trial of GI cocktail.  He feels better after GI cocktail.  Labs do show moderate elevation of lipase, but only to twice the normal range.  It is certainly possible his pain is from pancreatitis.  He is denying any recent alcohol use.  He is discharged with prescription for pantoprazole.  He is referred to the atrial fibrillation clinic regarding his atrial flutter.  CHA2DS2/VAS Stroke Risk Points  Current as of 3 minutes ago     4 >= 2 Points: High Risk  1 - 1.99 Points: Medium Risk  0 Points: Low Risk    The previous score was 3 on 04/12/2017.:  Last Change:     Details    This score determines the patient's risk of having a stroke if the  patient has atrial fibrillation.       Points Metrics  1 Has Congestive Heart Failure:  Yes    Current as of 3 minutes ago  0 Has Vascular Disease:  No    Current as of 3 minutes ago  1 Has Hypertension:  Yes    Current as of 3 minutes ago  2 Age:  46    Current as of 3 minutes ago  0 Has Diabetes:  No    Current as of 3 minutes ago  0 Had Stroke:  No  Had TIA:  No  Had thromboembolism:  No    Current as of 3 minutes ago  0 Male:  No    Current as of 3 minutes ago   Final Clinical Impressions(s) / ED Diagnoses   Final diagnoses:  Upper abdominal pain  Serum lipase elevation  Renal insufficiency  Macrocytic anemia  Typical atrial flutter (HCC)  Total bilirubin, elevated    ED Discharge Orders         Ordered    pantoprazole (PROTONIX) 40 MG tablet  Daily     02/19/18 0350    Amb Referral to AFIB  Clinic     02/19/18 1941           Delora Fuel, MD 74/08/14 [phone]

## 2018-02-20 ENCOUNTER — Encounter: Payer: Self-pay | Admitting: Family Medicine

## 2018-02-20 DIAGNOSIS — N2581 Secondary hyperparathyroidism of renal origin: Secondary | ICD-10-CM | POA: Insufficient documentation

## 2018-02-20 DIAGNOSIS — Z862 Personal history of diseases of the blood and blood-forming organs and certain disorders involving the immune mechanism: Secondary | ICD-10-CM | POA: Insufficient documentation

## 2018-02-20 DIAGNOSIS — N189 Chronic kidney disease, unspecified: Secondary | ICD-10-CM

## 2018-02-26 ENCOUNTER — Ambulatory Visit (HOSPITAL_COMMUNITY)
Admission: RE | Admit: 2018-02-26 | Discharge: 2018-02-26 | Disposition: A | Discharge status: Home / Self Care | Payer: Medicare Other | Source: Ambulatory Visit | Attending: Nurse Practitioner | Admitting: Nurse Practitioner

## 2018-02-26 ENCOUNTER — Encounter (HOSPITAL_COMMUNITY): Payer: Self-pay | Admitting: Nurse Practitioner

## 2018-02-26 VITALS — BP 132/74 | HR 104 | Ht 70.5 in | Wt 165.0 lb

## 2018-02-26 DIAGNOSIS — N183 Chronic kidney disease, stage 3 (moderate): Secondary | ICD-10-CM | POA: Diagnosis not present

## 2018-02-26 DIAGNOSIS — J45909 Unspecified asthma, uncomplicated: Secondary | ICD-10-CM | POA: Insufficient documentation

## 2018-02-26 DIAGNOSIS — I4892 Unspecified atrial flutter: Secondary | ICD-10-CM | POA: Diagnosis not present

## 2018-02-26 DIAGNOSIS — F17220 Nicotine dependence, chewing tobacco, uncomplicated: Secondary | ICD-10-CM | POA: Diagnosis not present

## 2018-02-26 DIAGNOSIS — Z79899 Other long term (current) drug therapy: Secondary | ICD-10-CM | POA: Diagnosis not present

## 2018-02-26 DIAGNOSIS — M1990 Unspecified osteoarthritis, unspecified site: Secondary | ICD-10-CM | POA: Insufficient documentation

## 2018-02-26 DIAGNOSIS — Z8249 Family history of ischemic heart disease and other diseases of the circulatory system: Secondary | ICD-10-CM | POA: Insufficient documentation

## 2018-02-26 DIAGNOSIS — M549 Dorsalgia, unspecified: Secondary | ICD-10-CM | POA: Insufficient documentation

## 2018-02-26 DIAGNOSIS — M109 Gout, unspecified: Secondary | ICD-10-CM | POA: Insufficient documentation

## 2018-02-26 DIAGNOSIS — I509 Heart failure, unspecified: Secondary | ICD-10-CM | POA: Diagnosis not present

## 2018-02-26 DIAGNOSIS — I130 Hypertensive heart and chronic kidney disease with heart failure and stage 1 through stage 4 chronic kidney disease, or unspecified chronic kidney disease: Secondary | ICD-10-CM | POA: Diagnosis not present

## 2018-02-26 DIAGNOSIS — G8929 Other chronic pain: Secondary | ICD-10-CM | POA: Diagnosis not present

## 2018-02-26 DIAGNOSIS — K7460 Unspecified cirrhosis of liver: Secondary | ICD-10-CM | POA: Diagnosis not present

## 2018-02-26 NOTE — Progress Notes (Signed)
Thank you MCr 

## 2018-02-26 NOTE — Progress Notes (Signed)
Primary Care Physician: Shirley, [place], DO Referring Physician: [facility] ER f/u Cardiologist: Dr. Weber Cooks [place] is a 77 y.o. male with a h/o  advanced liver cirrhosis and ah/o priorEtOH abuse(quit for 3-4 years) with portal HTN and ascites, atrial flutter with RVR in the past (not anticoagulated secondary to advanced liver disease), and CKDstage 3,.He was treated withAmiodarone for rate control and developed a junctional bradycardia, therefore this was discontinued and he was placed on Bisoprolol.  He was recently seen in the ER with back and abdominal pain which has resolved. He was found to be in atrial flutter and was referred here. Initial EKG showed atrial  flutter at 104 bpm, however on exam today, HR was regular and in the 60's. Pt states that he is not sym[ptomatic with flutter and it "comes and goes".  Today, he denies symptoms of palpitations, chest pain, shortness of breath, orthopnea, PND, lower extremity edema, dizziness, presyncope, syncope, or neurologic sequela. The patient is tolerating medications without difficulties and is otherwise without complaint today.   Past Medical History:  Diagnosis Date  . Acute congestive heart failure ([place])   . Arthritis    "all over"  . Asthma   . Blood in stool 04/11/2017  . Chronic kidney disease    "I see a kidney dr @ [facility]" (10/11/2016)  . Chronic lower back pain    "turned a truck over a long time ago" (10/11/2016)  . Dysrhythmia    bradycardia with occassional junctional rhythm  . ETOH abuse   . Gout   . Hypertension   . Inguinal hernia 10/28/2014   S/P inguinal hernia repair with 4L as ascites removed 03/30/17  . Liver cirrhosis ([place]) 2015  . Macrocytosis   . Pneumonia    "long long time ago" (10/11/2016)  . Spontaneous bacterial peritonitis ([place]) 07/01/2015   Past Surgical History:  Procedure Laterality Date  . INGUINAL HERNIA REPAIR Bilateral 03/30/2017   Procedure: OPEN BILATERAL INGUINAL HERNIA  REPAIR WITH MESH;  Surgeon: Kinsinger, Arta Bruce, MD;  [facility];  Service: General;  Laterality: Bilateral;  GENERAL COMBINED WITH REGIONAL FOR POST OP PAIN   . INSERTION OF MESH N/A 10/16/2016   Procedure: INSERTION OF MESH;  Surgeon: Kinsinger, Arta Bruce, MD;  Location: [facility];  Service: General;  Laterality: N/A;  . INSERTION OF MESH Bilateral 03/30/2017   Procedure: INSERTION OF MESH;  Surgeon: Kieth Brightly Arta Bruce, MD;  [facility];  Service: General;  Laterality: Bilateral;  GENERAL COMBINED WITH REGIONAL FOR POST OP PAIN   . IR PARACENTESIS  08/19/2016  . IR PARACENTESIS  08/26/2016  . IR PARACENTESIS  09/02/2016  . IR PARACENTESIS  09/09/2016  . IR PARACENTESIS  09/16/2016  . IR PARACENTESIS  10/03/2016  . IR PARACENTESIS  10/11/2016  . IR RADIOLOGIST EVAL & MGMT  09/29/2016  . IR RADIOLOGIST EVAL & MGMT  12/21/2016  . IR RADIOLOGIST EVAL & MGMT  01/23/2018  . IR TIPS  10/11/2016  . RADIOLOGY WITH ANESTHESIA N/A 10/11/2016   Procedure: TIPS;  Surgeon: Arne Cleveland, MD;  Location: [facility];  Service: Radiology;  Laterality: N/A;  . UMBILICAL HERNIA REPAIR N/A 10/16/2016   Procedure: HERNIA REPAIR UMBILICAL ADULT;  Surgeon: Kinsinger, Arta Bruce, MD;  Location: [facility];  Service: General;  Laterality: N/A;    Current Outpatient Medications  Medication Sig Dispense Refill  . allopurinol (ZYLOPRIM) 300 MG tablet TAKE 1 TABLET BY MOUTH EVERY DAY (Patient taking differently: Take 150 mg  by mouth daily. ) 90 tablet 1  . bisoprolol (ZEBETA) 5 MG tablet Take 0.5 tablets (2.5 mg total) by mouth daily. 90 tablet 1  . Cholecalciferol (VITAMIN D-3) 5000 units TABS Take 5,000 Units daily by mouth.     . ferrous sulfate 325 (65 FE) MG tablet Take 1 tablet (325 mg total) by mouth 2 (two) times daily with a meal. (Patient taking differently: Take 325 mg by mouth every Monday, Wednesday, and Friday. ) 60 tablet 3  . Fluticasone-Salmeterol (WIXELA INHUB) 500-50 MCG/DOSE AEPB Inhale 1 puff into the lungs 2  (two) times daily. 60 each 3  . furosemide (LASIX) 40 MG tablet Take 1.5 tablets (60 mg total) by mouth 3 (three) times daily. Take 60 mg 1-3 times daily based on daily weight measurements. (Patient taking differently: Take 40 mg by mouth 3 (three) times daily. Take 40mg  1-3 times daily based on daily weight measurements.) 90 tablet 0  . lactulose (CHRONULAC) 10 GM/15ML solution TAKE 15 MLS BY MOUTH 2 (TWO) TIMES DAILY AS NEEDED (NEED TO ENSURE 2 SOFT BM IN A DAY.). (Patient taking differently: Take 10 g by mouth 2 (two) times daily. ) 1892 mL 0  . montelukast (SINGULAIR) 10 MG tablet TAKE 1 TABLET BY MOUTH EVERYDAY AT BEDTIME (Patient taking differently: Take 10 mg by mouth at bedtime. ) 90 tablet 1  . Multiple Vitamin (MULTIVITAMIN WITH MINERALS) TABS tablet Take 1 tablet by mouth daily. 30 tablet 0  . Nutritional Supplements (ENSURE COMPLETE SHAKE) LIQD Take 1 Container by mouth daily. (Patient taking differently: Take 1 Container 2 (two) times daily by mouth. ) 237 mL 11  . pantoprazole (PROTONIX) 40 MG tablet Take 1 tablet (40 mg total) by mouth daily. 30 tablet 0  . PROAIR HFA 108 (90 Base) MCG/ACT inhaler Use 2 puffs every 4 hours  as needed for wheezing or  shortness of breath (Patient taking differently: Inhale 2 puffs into the lungs every 4 (four) hours as needed for wheezing. ) 51 g 3  . spironolactone (ALDACTONE) 25 MG tablet Take 1 tablet (25 mg total) by mouth daily. 90 tablet 0   No current facility-administered medications for this encounter.     No Known Allergies  Social History   Socioeconomic History  . Marital status: Divorced    Spouse name: Not on file  . Number of children: 5  . Years of education: 10  . Highest education level: Not on file  Occupational History  . 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 Financial resource strain: Not on file  . Food insecurity:    Worry: Not on file    Inability: Not on file  . Transportation needs:    Medical: Not  on file    Non-medical: Not on file  Tobacco Use  . Smoking status: Never Smoker  . Smokeless tobacco: Current User    Types: Snuff, Chew  Substance and Sexual Activity  . Alcohol use: No    Comment: quit drinking "over 2 yr ago" (10/11/2016)  . Drug use: No  . Sexual activity: Not Currently  Lifestyle  . Physical activity:    Days per week: Not on file    Minutes per session: Not on file  . Stress: Not on file  Relationships  . Social connections:    Talks on phone: Not on file    Gets together: Not on file    Attends religious service: Not on file    Active member of club  or organization: Not on file    Attends meetings of clubs or organizations: Not on file    Relationship status: Not on file  . Intimate partner violence:    Fear of current or ex partner: Not on file    Emotionally abused: Not on file    Physically abused: Not on file    Forced sexual activity: Not on file  Other Topics Concern  . Not on file  Social History Narrative   Lives with nephew and niece in [place] home.     Tobacco: snuff all day (can last two days. Never smoked.       Hobbies: cooking, sleeping, going to store   Pets:  Dog, Bean    Family History  Problem Relation Age of Onset  . Other Mother        died when pt was only 70 - unknown cause.  . Other Father        deceased.  . Asthma Sister   . Heart disease Sister        multiple stents.  . Asthma Brother   . Hypertension Brother   . Asthma Son   . Asthma Brother   . Hypertension Brother     ROS- All systems are reviewed and negative except as per the HPI above  Physical Exam: Vitals:   02/26/18 1409  BP: 132/74  Pulse: (!) 104  Weight: 74.8 kg  Height: 5' 10.5" (1.791 m)   Wt Readings from Last 3 Encounters:  02/26/18 74.8 kg  01/29/18 74.4 kg  01/23/18 73.5 kg    Labs: Lab Results  Component Value Date   NA 139 02/19/2018   K 3.4 (L) 02/19/2018   CL 105 02/19/2018   CO2 26 02/19/2018   GLUCOSE 127 (H)  02/19/2018   BUN 18 02/19/2018   CREATININE 1.63 (H) 02/19/2018   CALCIUM 9.4 02/19/2018   PHOS 3.7 04/13/2017   MG 2.2 04/13/2017   Lab Results  Component Value Date   INR 1.28 02/19/2018   Lab Results  Component Value Date   CHOL 141 11/11/2011   HDL 71 11/11/2011   LDLCALC 44 11/11/2011   TRIG 128 11/11/2011     GEN- The patient is well appearing, alert and oriented x 3 today.   Head- normocephalic, atraumatic Eyes-  Sclera clear, conjunctiva pink Ears- hearing intact Oropharynx- clear Neck- supple, no JVP Lymph- no cervical lymphadenopathy Lungs- Clear to ausculation bilaterally, normal work of breathing Heart- Regular rate and rhythm, no murmurs, rubs or gallops, PMI not laterally displaced GI- soft, NT, ND, + BS Extremities- no clubbing, cyanosis, or edema MS- no significant deformity or atrophy Skin- no rash or lesion Psych- euthymic mood, full affect Neuro- strength and sensation are intact  EKG- Aflutter at 104 bpm, qrs int 98 ms, qtc 462 ms     Assessment and Plan: 1. Atrial flutter  Longstanding and intermittent , asymptomatic Presented in flutter and by exam appeared to go into a normal rhythm with HR in the 60's and regular Continue bisoprolol Not anticoagulated  secondary to advanced liver disease , and CKDstage 3  F/u with Dr. Loletha Grayer in the next 2-3 weeks as he was to f/u with him in March and missed appointment   Butch Penny C. Anasofia Micallef, [facility] [address] [place], [place] [address] [phone]

## 2018-02-27 ENCOUNTER — Other Ambulatory Visit: Payer: Self-pay | Admitting: Gastroenterology

## 2018-02-27 ENCOUNTER — Other Ambulatory Visit: Payer: Self-pay

## 2018-02-27 ENCOUNTER — Ambulatory Visit (INDEPENDENT_AMBULATORY_CARE_PROVIDER_SITE_OTHER): Payer: Medicare Other | Admitting: Family Medicine

## 2018-02-27 ENCOUNTER — Encounter: Payer: Self-pay | Admitting: Family Medicine

## 2018-02-27 VITALS — BP 126/80 | HR 90 | Temp 98.6°F | Ht 71.0 in | Wt 165.4 lb

## 2018-02-27 DIAGNOSIS — R1013 Epigastric pain: Secondary | ICD-10-CM | POA: Diagnosis not present

## 2018-02-27 DIAGNOSIS — G8929 Other chronic pain: Secondary | ICD-10-CM

## 2018-02-27 DIAGNOSIS — N183 Chronic kidney disease, stage 3 unspecified: Secondary | ICD-10-CM

## 2018-02-27 DIAGNOSIS — Z7189 Other specified counseling: Secondary | ICD-10-CM | POA: Diagnosis not present

## 2018-02-27 DIAGNOSIS — K7460 Unspecified cirrhosis of liver: Secondary | ICD-10-CM

## 2018-02-27 NOTE — Patient Instructions (Signed)
It was wonderful to see you today.  Thank you for choosing [facility].   Please call [phone] with any questions about today's appointment.  Please be sure to schedule follow up at the front  desk before you leave today.   Cheryl Chay, MD  Family Medicine    

## 2018-02-27 NOTE — Progress Notes (Signed)
  [name] Date of Birth: [birth year] Date of Visit: 02/27/18 PCP: Shirley, [place], DO  Chief Complaint: Follow up from the emergency department  Subjective: Effrey Davidow is a pleasant 77 y.o. year old woman with a history of alcoholic cirrhosis status post TIPS procedure, heart failure deserved ejection fraction, tobacco abuse, and chronic kidney disease stage III presenting today for follow-up.  The patient was recently seen in the emergency department for some epigastric pain.  He was prescribed pantoprazole for this.  He reports he does not think this is the heartburn but the symptoms have completely resolved.  He reports he has no complaints today.  Mr. Lafond is independent.  He lives currently with his nephews.  He drives himself, although his nephew to drive him younger distances.  He cooks and cleans his own house.  He has not fallen in over a year.  He reports overall he feels well.  He continues to use smokeless tobacco.  He has no interest in quitting at this time.  He stopped drinking any alcohol over 5 years ago.  In regards to his cirrhosis, Mr. Munford follows closely with Dr. Paulita Fujita.    ROS:  ROS Negative for headaches, chest pain, difficulty breathing, abdominal pain, nausea, vomiting, abdominal distention, lower extremity edema I have reviewed the patient's medical, surgical, family, and social history as appropriate.   Vitals:   02/27/18 1437  BP: 126/80  Pulse: 90  Temp: 98.6 F (37 C)  SpO2: 98%   Filed Weights   02/27/18 1437  Weight: 165 lb 6.4 oz (75 kg)   HEENT: Sclera anicteric.  Patient is edentulous. Appears well hydrated. Neck: Supple Cardiac: Regular rate and rhythm. Normal S1/S2. No murmurs, rubs, or gallops appreciated. Lungs: Clear bilaterally to ascultation.  Abdomen: Normoactive bowel sounds. No tenderness to deep or light palpation. No rebound or guarding.  Extremities: Warm, well perfused without edema.  Skin: Warm,  dry Psych: Pleasant and appropriate    Roxy was seen today for hospitalization follow-up.  Diagnoses and all orders for this visit:  CKD (chronic kidney disease), stage III ([place]) could consider referral to nephrology in the future.  He is due for every 67-month chronic kidney disease labs including a phosphorus, metabolic panel, vitamin D and PTH.  Could also consider renal ultrasound in the future  Abdominal pain, chronic, epigastric this is resolved and I suspect may have been musculoskeletal in nature.  Recommend discontinuing PPI after a few more days as this has not seemed to change his symptoms he feels well overall  Advanced directives, counseling/discussion discussed with patient today.  He recently lost his sister whom he cared for for many years.  He reports that although he is ready to go he would still like to be full code and receive CPR if his heart were to stop.  He would not want to seek aggressive interventions such as prolonged ICU care or artificial nutrition through a feeding tube.  He cites his surrogate decision makers as his 2 sons in [place].    Dorris Singh, MD  Family Medicine Teaching Service

## 2018-03-06 ENCOUNTER — Ambulatory Visit
Admission: RE | Admit: 2018-03-06 | Discharge: 2018-03-06 | Disposition: A | Discharge status: Home / Self Care | Payer: Medicare Other | Source: Ambulatory Visit | Attending: Gastroenterology | Admitting: Gastroenterology

## 2018-03-06 DIAGNOSIS — K7460 Unspecified cirrhosis of liver: Secondary | ICD-10-CM | POA: Diagnosis not present

## 2018-03-06 DIAGNOSIS — K8020 Calculus of gallbladder without cholecystitis without obstruction: Secondary | ICD-10-CM | POA: Diagnosis not present

## 2018-03-26 ENCOUNTER — Ambulatory Visit: Payer: Medicare Other | Admitting: Cardiovascular Disease

## 2018-03-26 ENCOUNTER — Encounter: Payer: Self-pay | Admitting: Cardiovascular Disease

## 2018-03-26 VITALS — BP 142/70 | HR 129 | Ht 71.0 in | Wt 165.0 lb

## 2018-03-26 DIAGNOSIS — N183 Chronic kidney disease, stage 3 unspecified: Secondary | ICD-10-CM

## 2018-03-26 DIAGNOSIS — K7030 Alcoholic cirrhosis of liver without ascites: Secondary | ICD-10-CM | POA: Diagnosis not present

## 2018-03-26 DIAGNOSIS — I483 Typical atrial flutter: Secondary | ICD-10-CM

## 2018-03-26 MED ORDER — BISOPROLOL FUMARATE 5 MG PO TABS: 5.0000 mg | ORAL_TABLET | Freq: Every day | ORAL | 3 refills | Status: DC

## 2018-03-26 NOTE — Progress Notes (Signed)
Patient ID: Donald Moreno, male   DOB: 12-10-1940, 77 y.o.   MRN: [id number]    Cardiology Office Note    Date:  03/26/2018   ID:  Donald Moreno, DOB [birth year], MRN [id number]  PCP:  Shirley, [place], DO  Cardiologist:   Sanda Klein, MD   No chief complaint on file.   History of Present Illness:  Donald Moreno is a 77 y.o. male with advanced liver cirrhosis with portal HTN s/p TIPS, parenchymal insufficiency with spontaneously elevated prothrombin time, atrial flutter with RVR. He was treated with amiodarone for rate control and developed junctional bradycardia so the medication was stopped.  The rate control has been a challenge.  He has seen Roderic Palau in the A. fib clinic a few times.  At his last appointment October 14 his ventricular rate was 104.  He was asymptomatic.  Today he remains asymptomatic, but his heart rate is about 130.  The rhythm is regular and is almost certainly atrial flutter with 2: 1 AV block but we did not repeat an EKG today.  Overall he reports feeling great.  As always he has a very lax and positive outlook.  He denies chest discomfort, dizziness, syncope or palpitations.  He does not have edema or ascites.  He has not required paracentesis ever since his TIPS procedure.  He has not had falls or bleeding problems.  Wheezing has not been a big issue recently and he has not used his rescue inhaler weeks. He  Past Medical History:  Diagnosis Date  . Acute congestive heart failure ([place])   . Arthritis    "all over"  . Asthma   . Blood in stool 04/11/2017  . Chronic kidney disease    "I see a kidney dr @ [facility]" (10/11/2016)  . Chronic lower back pain    "turned a truck over a long time ago" (10/11/2016)  . Dysrhythmia    bradycardia with occassional junctional rhythm  . ETOH abuse   . Gout   . Hypertension   . Inguinal hernia 10/28/2014   S/P inguinal hernia repair with 4L as ascites removed 03/30/17  . Liver cirrhosis ([place]) 2015  .  Macrocytosis   . Pneumonia    "long long time ago" (10/11/2016)  . Spontaneous bacterial peritonitis ([place]) 07/01/2015    Past Surgical History:  Procedure Laterality Date  . INGUINAL HERNIA REPAIR Bilateral 03/30/2017   Procedure: OPEN BILATERAL INGUINAL HERNIA REPAIR WITH MESH;  Surgeon: Kinsinger, Arta Bruce, MD;  Location: [facility];  Service: General;  Laterality: Bilateral;  GENERAL COMBINED WITH REGIONAL FOR POST OP PAIN   . INSERTION OF MESH N/A 10/16/2016   Procedure: INSERTION OF MESH;  Surgeon: Kinsinger, Arta Bruce, MD;  Location: [place] [facility];  Service: General;  Laterality: N/A;  . INSERTION OF MESH Bilateral 03/30/2017   Procedure: INSERTION OF MESH;  Surgeon: Kieth Brightly Arta Bruce, MD;  Location: [facility];  Service: General;  Laterality: Bilateral;  GENERAL COMBINED WITH REGIONAL FOR POST OP PAIN   . IR PARACENTESIS  08/19/2016  . IR PARACENTESIS  08/26/2016  . IR PARACENTESIS  09/02/2016  . IR PARACENTESIS  09/09/2016  . IR PARACENTESIS  09/16/2016  . IR PARACENTESIS  10/03/2016  . IR PARACENTESIS  10/11/2016  . IR RADIOLOGIST EVAL & MGMT  09/29/2016  . IR RADIOLOGIST EVAL & MGMT  12/21/2016  . IR RADIOLOGIST EVAL & MGMT  01/23/2018  . IR TIPS  10/11/2016  . RADIOLOGY WITH ANESTHESIA N/A 10/11/2016   Procedure: TIPS;  Surgeon: Arne Cleveland, MD;  Location: [facility];  Service: Radiology;  Laterality: N/A;  . UMBILICAL HERNIA REPAIR N/A 10/16/2016   Procedure: HERNIA REPAIR UMBILICAL ADULT;  Surgeon: Kinsinger, Arta Bruce, MD;  Location: [facility] [place];  Service: General;  Laterality: N/A;    Outpatient Medications Prior to Visit  Medication Sig Dispense Refill  . allopurinol (ZYLOPRIM) 300 MG tablet TAKE 1 TABLET BY MOUTH EVERY DAY (Patient taking differently: Take 150 mg by mouth daily. ) 90 tablet 1  . Cholecalciferol (VITAMIN D-3) 5000 units TABS Take 5,000 Units daily by mouth.     . ferrous sulfate 325 (65 FE) MG tablet Take 1 tablet (325 mg total) by mouth 2 (two) times daily with a meal. (Patient  taking differently: Take 325 mg by mouth every Monday, Wednesday, and Friday. ) 60 tablet 3  . Fluticasone-Salmeterol (WIXELA INHUB) 500-50 MCG/DOSE AEPB Inhale 1 puff into the lungs 2 (two) times daily. 60 each 3  . furosemide (LASIX) 40 MG tablet Take 1.5 tablets (60 mg total) by mouth 3 (three) times daily. Take 60 mg 1-3 times daily based on daily weight measurements. (Patient taking differently: Take 40 mg by mouth 3 (three) times daily. Take 40mg  1-3 times daily based on daily weight measurements.) 90 tablet 0  . lactulose (CHRONULAC) 10 GM/15ML solution TAKE 15 MLS BY MOUTH 2 (TWO) TIMES DAILY AS NEEDED (NEED TO ENSURE 2 SOFT BM IN A DAY.). (Patient taking differently: Take 10 g by mouth 2 (two) times daily. ) 1892 mL 0  . montelukast (SINGULAIR) 10 MG tablet TAKE 1 TABLET BY MOUTH EVERYDAY AT BEDTIME (Patient taking differently: Take 10 mg by mouth at bedtime. ) 90 tablet 1  . Multiple Vitamin (MULTIVITAMIN WITH MINERALS) TABS tablet Take 1 tablet by mouth daily. 30 tablet 0  . Nutritional Supplements (ENSURE COMPLETE SHAKE) LIQD Take 1 Container by mouth daily. (Patient taking differently: Take 1 Container 2 (two) times daily by mouth. ) 237 mL 11  . PROAIR HFA 108 (90 Base) MCG/ACT inhaler Use 2 puffs every 4 hours  as needed for wheezing or  shortness of breath (Patient taking differently: Inhale 2 puffs into the lungs every 4 (four) hours as needed for wheezing. ) 51 g 3  . spironolactone (ALDACTONE) 25 MG tablet Take 1 tablet (25 mg total) by mouth daily. 90 tablet 0  . bisoprolol (ZEBETA) 5 MG tablet Take 0.5 tablets (2.5 mg total) by mouth daily. 90 tablet 1   No facility-administered medications prior to visit.      Allergies:   Patient has no known allergies.   Social History   Socioeconomic History  . Marital status: Divorced    Spouse name: Not on file  . Number of children: 5  . Years of education: 10  . Highest education level: Not on file  Occupational History  .  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 Financial resource strain: Not on file  . Food insecurity:    Worry: Not on file    Inability: Not on file  . Transportation needs:    Medical: Not on file    Non-medical: Not on file  Tobacco Use  . Smoking status: Never Smoker  . Smokeless tobacco: Current User    Types: Snuff, Chew  Substance and Sexual Activity  . Alcohol use: No    Comment: quit drinking "over 2 yr ago" (10/11/2016)  . Drug use: No  . Sexual activity: Not Currently  Lifestyle  . Physical  activity:    Days per week: Not on file    Minutes per session: Not on file  . Stress: Not on file  Relationships  . Social connections:    Talks on phone: Not on file    Gets together: Not on file    Attends religious service: Not on file    Active member of club or organization: Not on file    Attends meetings of clubs or organizations: Not on file    Relationship status: Not on file  Other Topics Concern  . Not on file  Social History Narrative   Lives with nephew and niece in [place] home.     Tobacco: snuff all day (can last two days. Never smoked.       Hobbies: cooking, sleeping, going to store   Pets:  Dog, Bean     Family History:  The patient's family history includes Asthma in his brother, brother, sister, and son; Heart disease in his sister; Hypertension in his brother and brother; Other in his father and mother.   ROS:   Please see the history of present illness.    ROS Other systems are reviewed and are negative   PHYSICAL EXAM:   VS:  BP (!) 142/70   Pulse (!) 129   Ht 5\' 11"  (1.803 m)   Wt 165 lb (74.8 kg)   BMI 23.01 kg/m    General: Alert, oriented x3, no distress, no longer appears cachectic. Head: no evidence of trauma, PERRL, EOMI, no exophtalmos or lid lag, no myxedema, no xanthelasma; normal ears, nose and oropharynx Neck: normal jugular venous pulsations and no hepatojugular   Wt Readings from Last 3 Encounters:  03/26/18 165  lb (74.8 kg)  02/27/18 165 lb 6.4 oz (75 kg)  02/26/18 165 lb (74.8 kg)      Studies/Labs Reviewed:   EKG:  EKG is not ordered today.  02/26/2018 ECG shows atrial flutter with variable block and rapid ventricular response Recent Labs: 04/13/2017: Magnesium 2.2; TSH 3.285 08/03/2017: BNP 792.1 02/19/2018: ALT 21; BUN 18; Creatinine, Ser 1.63; Hemoglobin 11.5; Platelets 157; Potassium 3.4; Sodium 139   Lipid Panel    Component Value Date/Time   CHOL 141 11/11/2011 1030   TRIG 128 11/11/2011 1030   HDL 71 11/11/2011 1030   CHOLHDL 2.0 11/11/2011 1030   VLDL 26 11/11/2011 1030   LDLCALC 44 11/11/2011 1030     ASSESSMENT:    1. Typical atrial flutter ([place])   2. Alcoholic cirrhosis of liver without ascites ([place])   3. CKD (chronic kidney disease) stage 3, GFR 30-59 ml/min ([facility])      PLAN:  In order of problems listed above:  1. Atrial flutter: Now again with rapid ventricular rate.  Will increase bisoprolol to 5 mg daily. Amiodarone led to severe junctional bradycardia, even when used in low dose. 2. Cirrhosis: Sustained improvement in hemodynamic problems after the TIPS procedure, no ascites, no evidence of parenchymal insufficiency 3. CKD: Baseline creatinine seems to be 1.6-2.0, GFR 40-45   Medication Adjustments/Labs and Tests Ordered: Current medicines are reviewed at length with the patient today.  Concerns regarding medicines are outlined above.  Medication changes, Labs and Tests ordered today are listed in the Patient Instructions below. Patient Instructions  Medication Instructions:  Dr Sallyanne Kuster has recommended making the following medication changes: 1. INCREASE Bisoprolol to 5 mg daily  If you need a refill on your cardiac medications before your next appointment, please call your pharmacy.   Follow-Up:  Dr Sallyanne Kuster recommends that you schedule a follow-up appointment in 2 months with Roderic Palau, NP in the A[facility] Clinic.  At [facility], you and your  health needs are our priority.  As part of our continuing mission to provide you with exceptional heart care, we have created designated Provider Care Teams.  These Care Teams include your primary Cardiologist (physician) and Advanced Practice Providers (APPs -  Physician Assistants and Nurse Practitioners) who all work together to provide you with the care you need, when you need it. You will need a follow up appointment in 6 months.  Please call our office 2 months in advance to schedule this appointment.  You may see Sanda Klein, MD or one of the following Advanced Practice Providers on your designated Care Team: [place], [place] . Fabian Sharp, PA-C      Signed, Sanda Klein, MD  03/26/2018 1:48 PM    [facility] [address], [place], [place]  [address] Phone: [phone]; Fax: [phone]

## 2018-03-26 NOTE — Patient Instructions (Signed)
Medication Instructions:  Dr Sallyanne Kuster has recommended making the following medication changes: 1. INCREASE Bisoprolol to 5 mg daily  If you need a refill on your cardiac medications before your next appointment, please call your pharmacy.   Follow-Up:  Dr Sallyanne Kuster recommends that you schedule a follow-up appointment in 2 months with Roderic Palau, NP in the A-Fib Clinic.  At [facility], you and your health needs are our priority.  As part of our continuing mission to provide you with exceptional heart care, we have created designated Provider Care Teams.  These Care Teams include your primary Cardiologist (physician) and Advanced Practice Providers (APPs -  Physician Assistants and Nurse Practitioners) who all work together to provide you with the care you need, when you need it. You will need a follow up appointment in 6 months.  Please call our office 2 months in advance to schedule this appointment.  You may see Sanda Klein, MD or one of the following Advanced Practice Providers on your designated Care Team: [place], [place] . Fabian Sharp, PA-C

## 2018-03-30 ENCOUNTER — Other Ambulatory Visit: Payer: Self-pay | Admitting: Family Medicine

## 2018-04-15 ENCOUNTER — Telehealth: Payer: Self-pay | Admitting: Nurse Practitioner

## 2018-04-15 NOTE — Telephone Encounter (Signed)
   I received a message from the answering service with request to call back to [phone] related to weight gain.  The name listed was Donald Moreno with [place] [facility].  I did call this number back and someone answered but did not understand why was calling and hung up.  I checked the patient demographics and noted that this number is listed as 1 of the patient's contact numbers.  I called back several other times and it went straight to voicemail.  Murray Hodgkins, NP 04/15/2018, 1:47 PM

## 2018-05-07 ENCOUNTER — Other Ambulatory Visit: Payer: Self-pay | Admitting: Family Medicine

## 2018-05-07 DIAGNOSIS — I509 Heart failure, unspecified: Secondary | ICD-10-CM

## 2018-05-18 ENCOUNTER — Other Ambulatory Visit: Payer: Self-pay | Admitting: Family Medicine

## 2018-05-24 ENCOUNTER — Other Ambulatory Visit: Payer: Self-pay | Admitting: Family Medicine

## 2018-05-24 ENCOUNTER — Encounter (HOSPITAL_COMMUNITY): Payer: Self-pay | Admitting: Nurse Practitioner

## 2018-05-24 ENCOUNTER — Ambulatory Visit (HOSPITAL_COMMUNITY)
Admission: RE | Admit: 2018-05-24 | Discharge: 2018-05-24 | Disposition: A | Discharge status: Home / Self Care | Payer: Medicare Other | Source: Ambulatory Visit | Attending: Nurse Practitioner | Admitting: Nurse Practitioner

## 2018-05-24 VITALS — BP 136/74 | HR 119 | Ht 71.0 in | Wt 172.0 lb

## 2018-05-24 DIAGNOSIS — I509 Heart failure, unspecified: Secondary | ICD-10-CM | POA: Insufficient documentation

## 2018-05-24 DIAGNOSIS — N183 Chronic kidney disease, stage 3 (moderate): Secondary | ICD-10-CM | POA: Diagnosis not present

## 2018-05-24 DIAGNOSIS — M1990 Unspecified osteoarthritis, unspecified site: Secondary | ICD-10-CM | POA: Diagnosis not present

## 2018-05-24 DIAGNOSIS — K7460 Unspecified cirrhosis of liver: Secondary | ICD-10-CM | POA: Diagnosis not present

## 2018-05-24 DIAGNOSIS — K766 Portal hypertension: Secondary | ICD-10-CM | POA: Diagnosis not present

## 2018-05-24 DIAGNOSIS — I4892 Unspecified atrial flutter: Secondary | ICD-10-CM

## 2018-05-24 DIAGNOSIS — R001 Bradycardia, unspecified: Secondary | ICD-10-CM | POA: Insufficient documentation

## 2018-05-24 DIAGNOSIS — Z79899 Other long term (current) drug therapy: Secondary | ICD-10-CM | POA: Diagnosis not present

## 2018-05-24 DIAGNOSIS — M109 Gout, unspecified: Secondary | ICD-10-CM | POA: Insufficient documentation

## 2018-05-24 DIAGNOSIS — R188 Other ascites: Secondary | ICD-10-CM | POA: Diagnosis not present

## 2018-05-24 MED ORDER — BISOPROLOL FUMARATE 10 MG PO TABS: 10.0000 mg | ORAL_TABLET | Freq: Every day | ORAL | 3 refills | Status: DC

## 2018-05-24 MED ORDER — ALBUTEROL SULFATE HFA 108 (90 BASE) MCG/ACT IN AERS: 2.0000 | INHALATION_SPRAY | RESPIRATORY_TRACT | 2 refills | Status: DC | PRN

## 2018-05-24 MED ORDER — FLUTICASONE-SALMETEROL 500-50 MCG/DOSE IN AEPB: 1.0000 | INHALATION_SPRAY | Freq: Two times a day (BID) | RESPIRATORY_TRACT | 1 refills | Status: DC

## 2018-05-24 NOTE — Progress Notes (Signed)
Primary Care Physician: Shirley, Martin[place], DO Referring Physician: [facility] ER f/u Cardiologist: Dr. Weber Cooks Korinek is a 78 y.o. male with a h/o  advanced liver cirrhosis and ah/o priorEtOH abuse(quit for 3-4 years) with portal HTN and ascites, atrial flutter with RVR in the past (not anticoagulated secondary to advanced liver disease), and CKDstage 3,.He was treated withAmiodarone for rate control and developed a junctional bradycardia, therefore this was discontinued and he was placed on Bisoprolol.  He was  seen in the ER 02/2018 with back and abdominal pain which has resolved. He was found to be in atrial flutter and was referred here. Initial EKG showed atrial  flutter at 104 bpm, however on exam today, HR was regular and in the 60's. Pt states that he is not symptomatic with flutter and it "comes and goes". He had f/u with Dr. Loletha Grayer about one month later, HR was elevated and bisoprolol was increased to 5 mg daily.  F/u in afib clinic, 1/9. He is asymptomatic and feels well but has v rate in the at 119 bpm, either accelerated junctional rhythm or atrial flutter. He is planning to go to [place] to visit his kids ia few weeks for one month.  Today, he denies symptoms of palpitations, chest pain, shortness of breath, orthopnea, PND, lower extremity edema, dizziness, presyncope, syncope, or neurologic sequela. The patient is tolerating medications without difficulties and is otherwise without complaint today.   Past Medical History:  Diagnosis Date  . Acute congestive heart failure ([place])   . Arthritis    "all over"  . Asthma   . Blood in stool 04/11/2017  . Chronic kidney disease    "I see a kidney dr @ [facility]" (10/11/2016)  . Chronic lower back pain    "turned a truck over a long time ago" (10/11/2016)  . Dysrhythmia    bradycardia with occassional junctional rhythm  . ETOH abuse   . Gout   . Hypertension   . Inguinal hernia 10/28/2014   S/P inguinal hernia repair  with 4L as ascites removed 03/30/17  . Liver cirrhosis ([place]) 2015  . Macrocytosis   . Pneumonia    "long long time ago" (10/11/2016)  . Spontaneous bacterial peritonitis ([place]) 07/01/2015   Past Surgical History:  Procedure Laterality Date  . INGUINAL HERNIA REPAIR Bilateral 03/30/2017   Procedure: OPEN BILATERAL INGUINAL HERNIA REPAIR WITH MESH;  Surgeon: Kinsinger, Arta Bruce, MD;  [facility];  Service: General;  Laterality: Bilateral;  GENERAL COMBINED WITH REGIONAL FOR POST OP PAIN   . INSERTION OF MESH N/A 10/16/2016   Procedure: INSERTION OF MESH;  Surgeon: Kinsinger, Arta Bruce, MD;  Location: [facility];  Service: General;  Laterality: N/A;  . INSERTION OF MESH Bilateral 03/30/2017   Procedure: INSERTION OF MESH;  Surgeon: Kieth Brightly Arta Bruce, MD;  [facility];  Service: General;  Laterality: Bilateral;  GENERAL COMBINED WITH REGIONAL FOR POST OP PAIN   . IR PARACENTESIS  08/19/2016  . IR PARACENTESIS  08/26/2016  . IR PARACENTESIS  09/02/2016  . IR PARACENTESIS  09/09/2016  . IR PARACENTESIS  09/16/2016  . IR PARACENTESIS  10/03/2016  . IR PARACENTESIS  10/11/2016  . IR RADIOLOGIST EVAL & MGMT  09/29/2016  . IR RADIOLOGIST EVAL & MGMT  12/21/2016  . IR RADIOLOGIST EVAL & MGMT  01/23/2018  . IR TIPS  10/11/2016  . RADIOLOGY WITH ANESTHESIA N/A 10/11/2016   Procedure: TIPS;  Surgeon: Arne Cleveland, MD;  Location: [facility];  Service: Radiology;  Laterality: N/A;  . UMBILICAL HERNIA REPAIR N/A 10/16/2016   Procedure: HERNIA REPAIR UMBILICAL ADULT;  Surgeon: Kinsinger, Arta Bruce, MD;  Location: [facility];  Service: General;  Laterality: N/A;    Current Outpatient Medications  Medication Sig Dispense Refill  . allopurinol (ZYLOPRIM) 300 MG tablet TAKE 1 TABLET BY MOUTH EVERY DAY (Patient taking differently: Take 150 mg by mouth daily. ) 90 tablet 1  . bisoprolol (ZEBETA) 10 MG tablet Take 1 tablet (10 mg total) by mouth daily. 30 tablet 3  . Cholecalciferol (VITAMIN D-3) 5000 units TABS Take  5,000 Units daily by mouth.     . ferrous sulfate 325 (65 FE) MG tablet Take 1 tablet (325 mg total) by mouth 2 (two) times daily with a meal. (Patient taking differently: Take 325 mg by mouth every Monday, Wednesday, and Friday. ) 60 tablet 3  . Fluticasone-Salmeterol (WIXELA INHUB) 500-50 MCG/DOSE AEPB Inhale 1 puff into the lungs 2 (two) times daily. 60 each 3  . furosemide (LASIX) 40 MG tablet Take 1.5 tablets (60 mg total) by mouth 3 (three) times daily. Take 60 mg 1-3 times daily based on daily weight measurements. (Patient taking differently: Take 40 mg by mouth 3 (three) times daily. Take 40mg  1-3 times daily based on daily weight measurements.) 90 tablet 0  . lactulose (CHRONULAC) 10 GM/15ML solution TAKE 15 MLS BY MOUTH 2 (TWO) TIMES DAILY AS NEEDED (NEED TO ENSURE 2 SOFT BM IN A DAY.). 5676 mL 1  . montelukast (SINGULAIR) 10 MG tablet TAKE 1 TABLET BY MOUTH EVERYDAY AT BEDTIME (Patient taking differently: Take 10 mg by mouth at bedtime. ) 90 tablet 1  . Multiple Vitamin (MULTIVITAMIN WITH MINERALS) TABS tablet Take 1 tablet by mouth daily. 30 tablet 0  . Nutritional Supplements (ENSURE COMPLETE SHAKE) LIQD Take 1 Container by mouth daily. (Patient taking differently: Take 1 Container 2 (two) times daily by mouth. ) 237 mL 11  . albuterol (PROAIR HFA) 108 (90 Base) MCG/ACT inhaler Inhale 2 puffs into the lungs every 4 (four) hours as needed for wheezing. 1 Inhaler 2  . Fluticasone-Salmeterol (ADVAIR DISKUS) 500-50 MCG/DOSE AEPB Inhale 1 puff into the lungs 2 (two) times daily. 180 each 1  . spironolactone (ALDACTONE) 25 MG tablet TAKE 1 TABLET BY MOUTH EVERY DAY 90 tablet 0   No current facility-administered medications for this encounter.     No Known Allergies  Social History   Socioeconomic History  . Marital status: Divorced    Spouse name: Not on file  . Number of children: 5  . Years of education: 10  . Highest education level: Not on file  Occupational History  .  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 Financial resource strain: Not on file  . Food insecurity:    Worry: Not on file    Inability: Not on file  . Transportation needs:    Medical: Not on file    Non-medical: Not on file  Tobacco Use  . Smoking status: Never Smoker  . Smokeless tobacco: Current User    Types: Snuff, Chew  Substance and Sexual Activity  . Alcohol use: No    Comment: quit drinking "over 2 yr ago" (10/11/2016)  . Drug use: No  . Sexual activity: Not Currently  Lifestyle  . Physical activity:    Days per week: Not on file    Minutes per session: Not on file  . Stress: Not on file  Relationships  . Social  connections:    Talks on phone: Not on file    Gets together: Not on file    Attends religious service: Not on file    Active member of club or organization: Not on file    Attends meetings of clubs or organizations: Not on file    Relationship status: Not on file  . Intimate partner violence:    Fear of current or ex partner: Not on file    Emotionally abused: Not on file    Physically abused: Not on file    Forced sexual activity: Not on file  Other Topics Concern  . Not on file  Social History Narrative   Lives with nephew and niece in [place] home.     Tobacco: snuff all day (can last two days. Never smoked.       Hobbies: cooking, sleeping, going to store   Pets:  Dog, Bean    Family History  Problem Relation Age of Onset  . Other Mother        died when pt was only 49 - unknown cause.  . Other Father        deceased.  . Asthma Sister   . Heart disease Sister        multiple stents.  . Asthma Brother   . Hypertension Brother   . Asthma Son   . Asthma Brother   . Hypertension Brother     ROS- All systems are reviewed and negative except as per the HPI above  Physical Exam: Vitals:   05/24/18 1012  BP: 136/74  Pulse: (!) 119  Weight: 78 kg  Height: 5\' 11"  (1.803 m)   Wt Readings from Last 3 Encounters:  05/24/18 78  kg  03/26/18 74.8 kg  02/27/18 75 kg    Labs: Lab Results  Component Value Date   NA 139 02/19/2018   K 3.4 (L) 02/19/2018   CL 105 02/19/2018   CO2 26 02/19/2018   GLUCOSE 127 (H) 02/19/2018   BUN 18 02/19/2018   CREATININE 1.63 (H) 02/19/2018   CALCIUM 9.4 02/19/2018   PHOS 3.7 04/13/2017   MG 2.2 04/13/2017   Lab Results  Component Value Date   INR 1.28 02/19/2018   Lab Results  Component Value Date   CHOL 141 11/11/2011   HDL 71 11/11/2011   LDLCALC 44 11/11/2011   TRIG 128 11/11/2011     GEN- The patient is well appearing, alert and oriented x 3 today.   Head- normocephalic, atraumatic Eyes-  Sclera clear, conjunctiva pink Ears- hearing intact Oropharynx- clear Neck- supple, no JVP Lymph- no cervical lymphadenopathy Lungs- Clear to ausculation bilaterally, normal work of breathing Heart- Regular rate and rhythm, no murmurs, rubs or gallops, PMI not laterally displaced GI- soft, NT, ND, + BS Extremities- no clubbing, cyanosis, or edema MS- no significant deformity or atrophy Skin- no rash or lesion Psych- euthymic mood, full affect Neuro- strength and sensation are intact  EKG- Aflutter at 119 bpm, qrs int 98 ms, qtc 462 ms     Assessment and Plan: 1. Atrial flutter  Longstanding and intermittent , asymptomatic Will increase bisoprolol to 10 mg daily Not anticoagulated  secondary to advanced liver disease   F/u here with EKG in one week  Butch Penny C. Deveion Denz, [facility] [address] [place], [place] [address] [phone]

## 2018-05-24 NOTE — Patient Instructions (Addendum)
Increase bisoprolol to 10mg once a day 

## 2018-05-24 NOTE — Telephone Encounter (Signed)
Patient needs an asthma sprayer refilled to [facility].  [phone] you can leave message if you need to.  Patient needs the pocket sprayers bc he has to leave on 06/12/18.

## 2018-05-31 ENCOUNTER — Ambulatory Visit (HOSPITAL_COMMUNITY)
Admission: RE | Admit: 2018-05-31 | Discharge: 2018-05-31 | Disposition: A | Discharge status: Home / Self Care | Payer: Medicare Other | Source: Ambulatory Visit | Attending: Nurse Practitioner | Admitting: Nurse Practitioner

## 2018-05-31 VITALS — BP 120/78 | HR 125

## 2018-05-31 DIAGNOSIS — I4892 Unspecified atrial flutter: Secondary | ICD-10-CM

## 2018-05-31 DIAGNOSIS — R9431 Abnormal electrocardiogram [ECG] [EKG]: Secondary | ICD-10-CM | POA: Diagnosis not present

## 2018-05-31 NOTE — Progress Notes (Addendum)
Pt in for EKG after increasing bisoprolol to 10 mg daily.  Pt states he is tolerating it well.  To be reviewed by Roderic Palau, NP  Bisoprolol increased to 10 mg daily for aflutter with RVR that was noted at last appointment. Pt asymptomatic. Today EKG shows atrial flutter at 125 on presentation with EKG. But on exam, He was regular with HR at 65. He feels no different on the 10 mg bisoprolol, or in aflutter vrs SR. He states that he walks every morning and feels great. BP 120/78. Will place a one week Zio patch to assess burden. I will call with next step after review. He is planning on going to [place] to visit relatives x one month at the end of January.

## 2018-06-04 IMAGING — US US ART/VEN ABD/PELV/SCROTUM DOPPLER LTD
1 series · 14 of 25 positions shown · non-contrast
Comparison: Preop 10/03/2016

CLINICAL DATA: Cirrhosis, recurrent ascites requiring repeat large
volume paracentesis, now status post TIPS creation 10/11/2016 with
improved ascites control.

EXAM:
DUPLEX ULTRASOUND OF LIVER AND TIPS SHUNT
TECHNIQUE: Color and duplex Doppler ultrasound was performed to evaluate the
hepatic in-flow and out-flow vessels.

[Series 1: us art/ven abd/pelv/scrotum doppler ltd · 0.23mm/px · 14 of 44 slices shown]
[im 1/44]
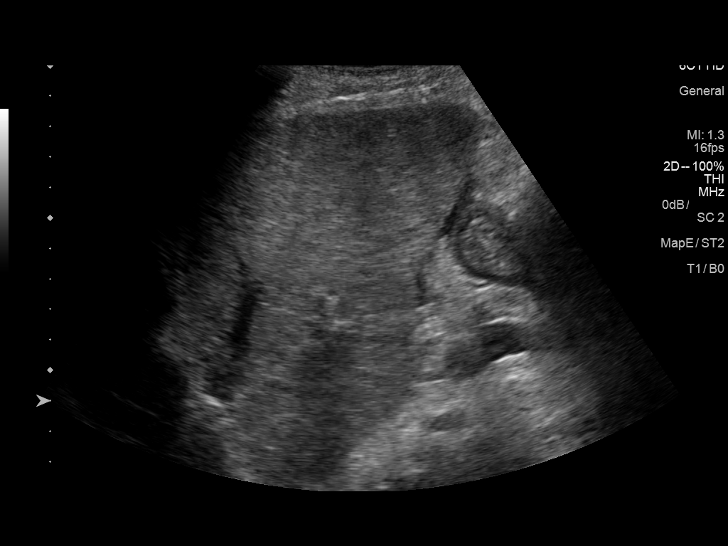
[im 4/44]
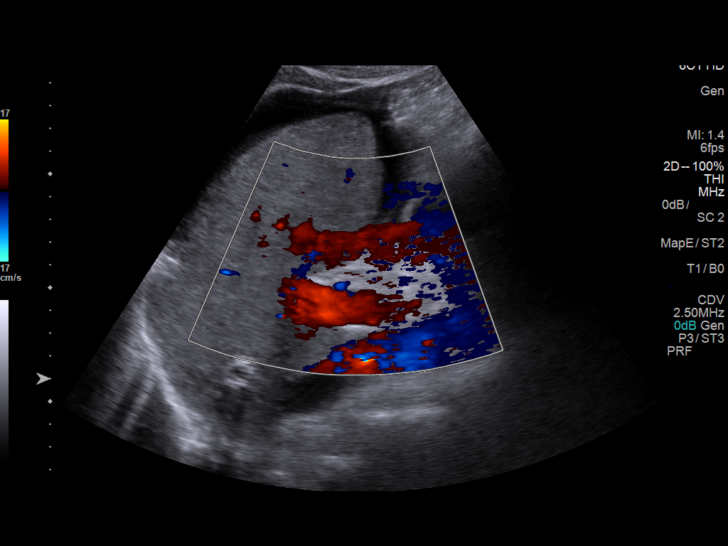
[im 8/44]
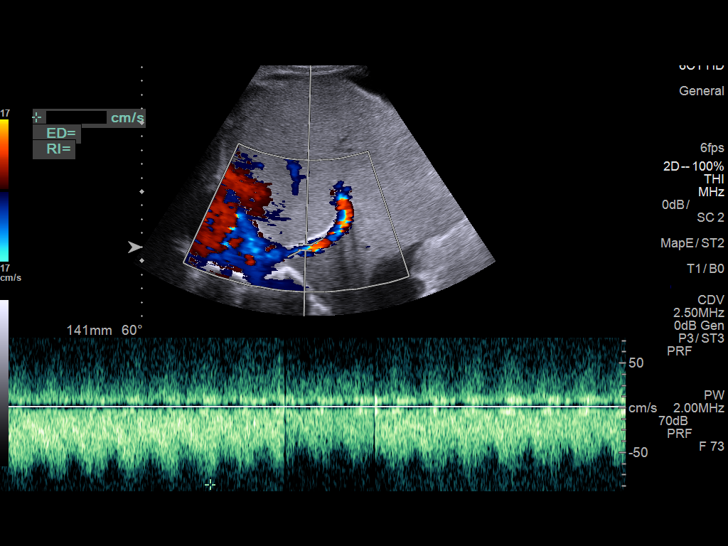
[im 11/44]
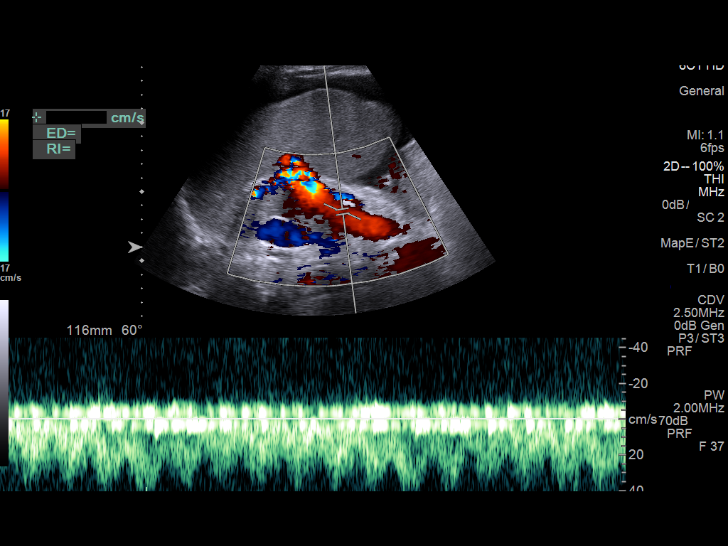
[im 15/44]
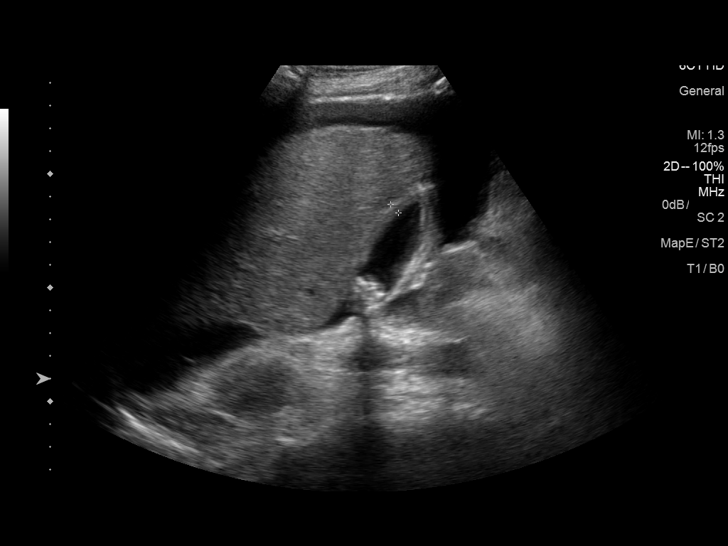
[im 17/44]
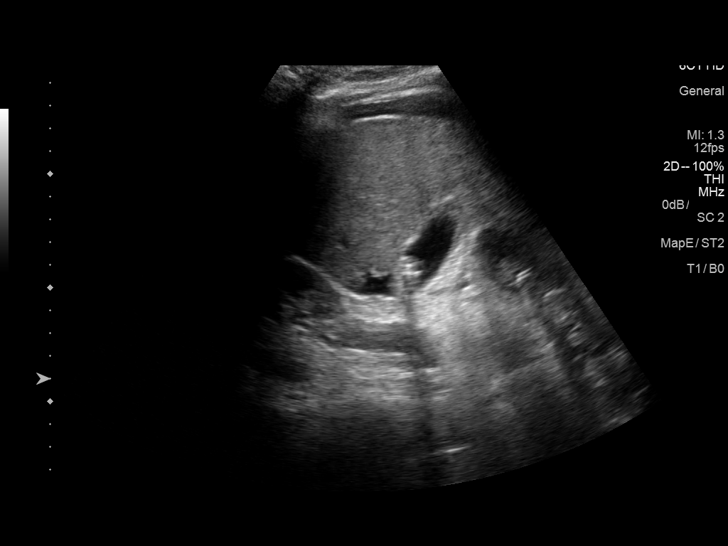
[im 20/44]
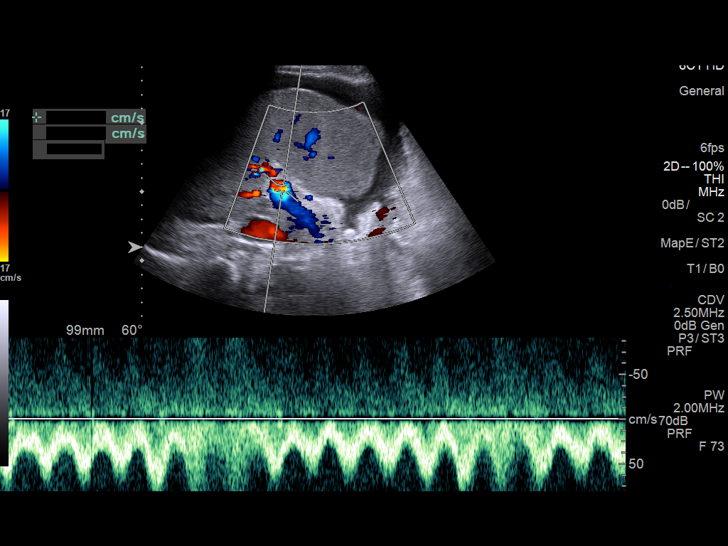
[im 24/44]
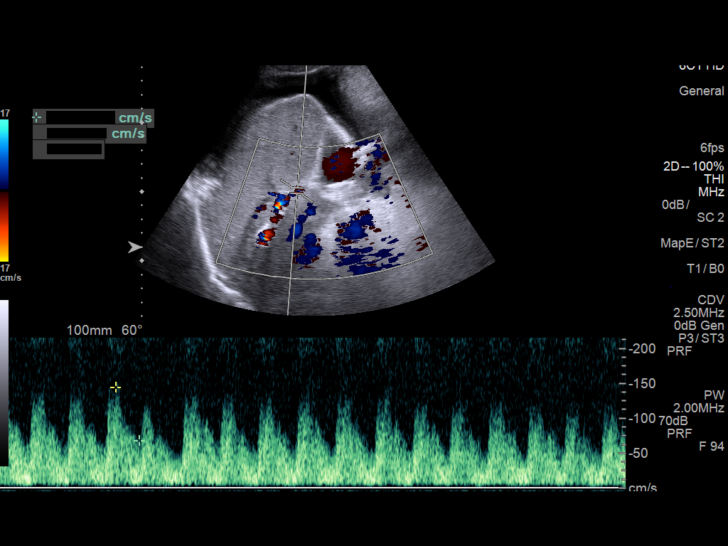
[im 27/44]
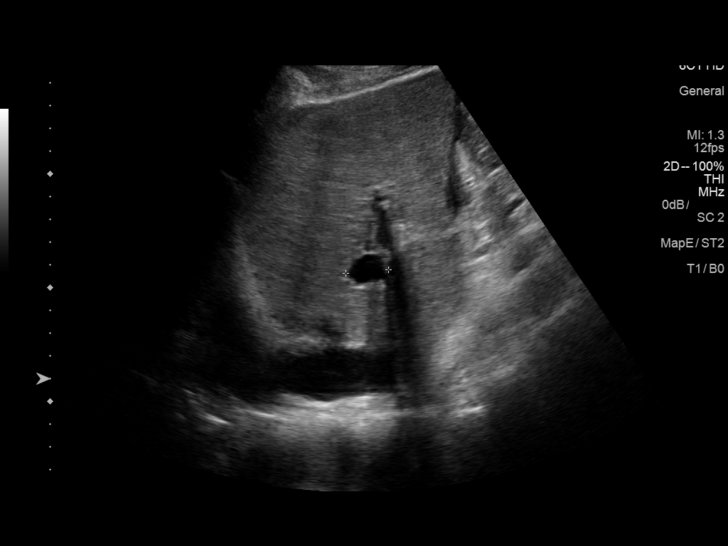
[im 29/44]
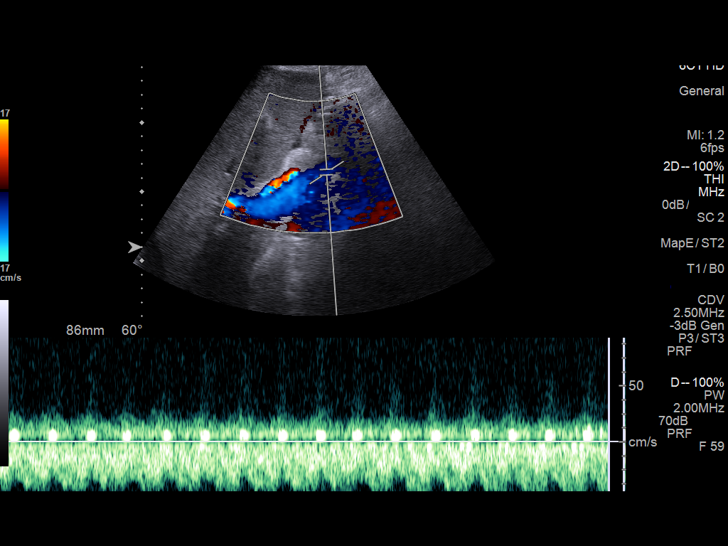
[im 33/44]
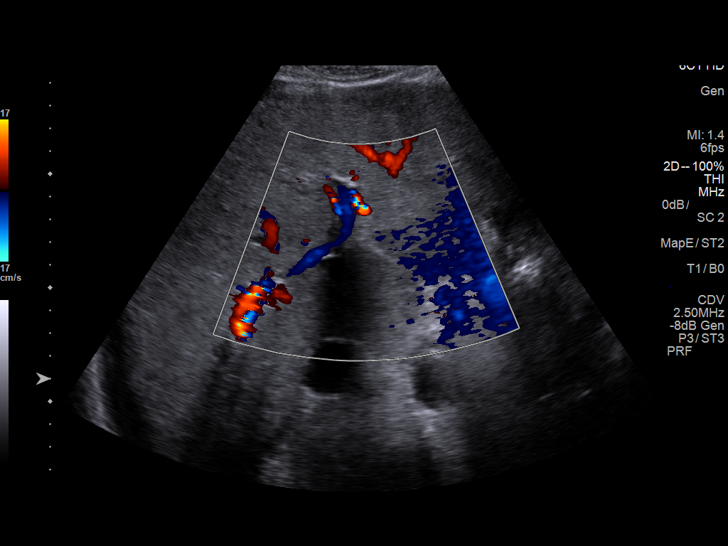
[im 36/44]
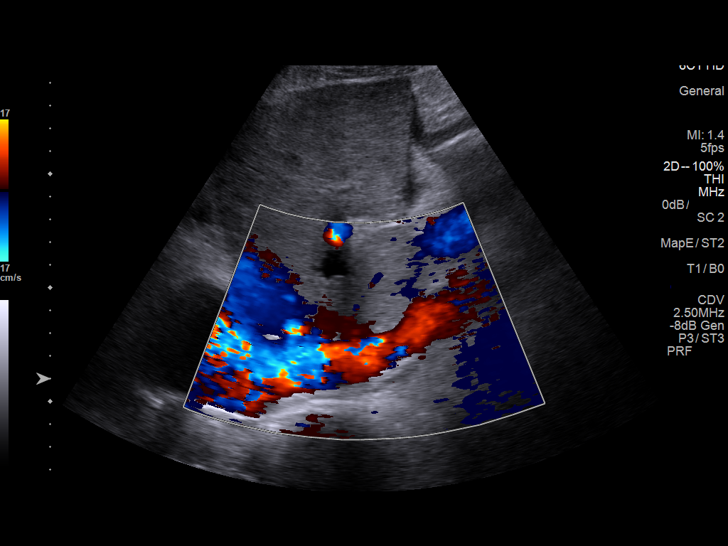
[im 40/44]
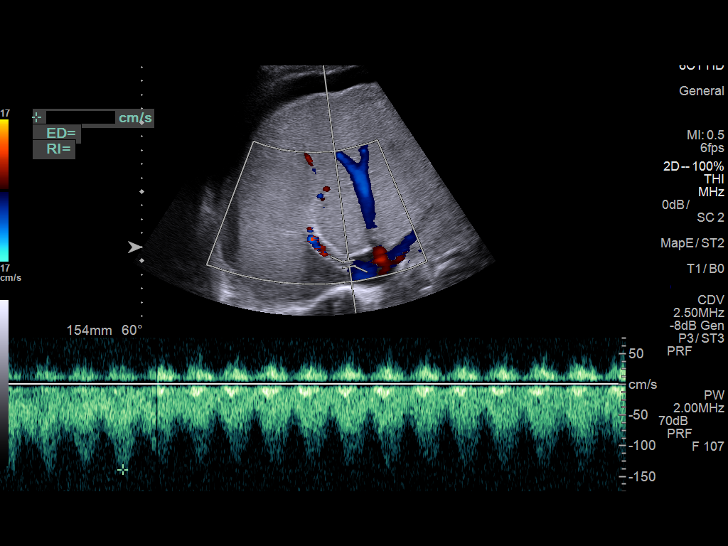
[im 44/44]
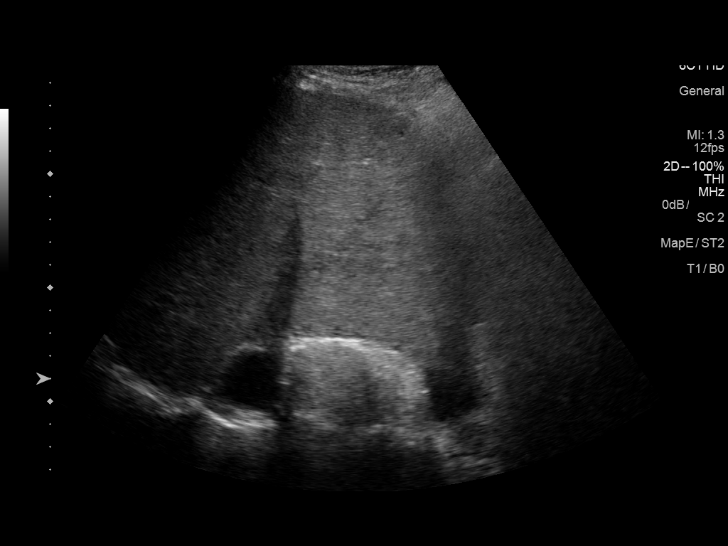

[14 of 25 positions shown; findings below may reference images not displayed]

FINDINGS: TIPS Shunt Velocities

Proximal:  107 cm/sec

Mid:  160

Distal:  160   cm/sec

Portal Vein Velocities (all  hepatopetal)

Main:  41 Cm/sec

Right:  34 cm/sec

Left:  30 cm/sec

Hepatic Vein Velocities (all hepatofugal)

Right:  82 cm/sec

Middle:  26 cm/sec

Left:  78 cm/sec

Hepatic Artery Velocity 145 cm/second

Splenic Vein Velocity:  50 cm/second

Varices:  None identified

Ascites: Present, perihepatic

Other findings: 1.9 cm simple cyst, right hepatic lobe.
Cholelithiasis. Gallbladder wall thickening up to 5 mm.
IMPRESSION: 1. Widely patent TIPS with continued hepatopetal portal venous flow.
2. Small amount of residual/recurrent perihepatic ascites.

## 2018-06-08 ENCOUNTER — Telehealth (HOSPITAL_COMMUNITY): Payer: Self-pay | Admitting: *Deleted

## 2018-06-08 NOTE — Telephone Encounter (Signed)
Pt called and wanted to let Roderic Palau, NP, know that he has sent that monitor back in on 06/07/2018 like she had advised him to do. Pt aware I would let Butch Penny know.

## 2018-06-12 DIAGNOSIS — I4892 Unspecified atrial flutter: Secondary | ICD-10-CM | POA: Diagnosis not present

## 2018-06-29 ENCOUNTER — Other Ambulatory Visit: Payer: Self-pay | Admitting: Family Medicine

## 2018-06-29 NOTE — Telephone Encounter (Signed)
Previously discussed with patient that this medication should be renally dosed. Was taking 1/2 tab of 300mg  pills that he had just refilled, will now need to take 1/2 tab of 100mg  pills based on his current GFR (69mg  of allopurinol daily).

## 2018-07-09 ENCOUNTER — Encounter (HOSPITAL_COMMUNITY): Payer: Self-pay | Admitting: Nurse Practitioner

## 2018-07-09 ENCOUNTER — Ambulatory Visit (HOSPITAL_COMMUNITY)
Admission: RE | Admit: 2018-07-09 | Discharge: 2018-07-09 | Disposition: A | Discharge status: Home / Self Care | Payer: Medicare Other | Source: Ambulatory Visit | Attending: Nurse Practitioner | Admitting: Nurse Practitioner

## 2018-07-09 VITALS — BP 132/68 | HR 47 | Ht 71.0 in | Wt 169.0 lb

## 2018-07-09 DIAGNOSIS — M109 Gout, unspecified: Secondary | ICD-10-CM | POA: Diagnosis not present

## 2018-07-09 DIAGNOSIS — I1310 Hypertensive heart and chronic kidney disease without heart failure, with stage 1 through stage 4 chronic kidney disease, or unspecified chronic kidney disease: Secondary | ICD-10-CM | POA: Insufficient documentation

## 2018-07-09 DIAGNOSIS — N183 Chronic kidney disease, stage 3 (moderate): Secondary | ICD-10-CM | POA: Insufficient documentation

## 2018-07-09 DIAGNOSIS — I4892 Unspecified atrial flutter: Secondary | ICD-10-CM | POA: Insufficient documentation

## 2018-07-09 DIAGNOSIS — M1990 Unspecified osteoarthritis, unspecified site: Secondary | ICD-10-CM | POA: Insufficient documentation

## 2018-07-09 DIAGNOSIS — M545 Low back pain: Secondary | ICD-10-CM | POA: Diagnosis not present

## 2018-07-09 DIAGNOSIS — Z79899 Other long term (current) drug therapy: Secondary | ICD-10-CM | POA: Diagnosis not present

## 2018-07-09 DIAGNOSIS — G8929 Other chronic pain: Secondary | ICD-10-CM | POA: Diagnosis not present

## 2018-07-09 DIAGNOSIS — K7460 Unspecified cirrhosis of liver: Secondary | ICD-10-CM | POA: Diagnosis not present

## 2018-07-09 DIAGNOSIS — R001 Bradycardia, unspecified: Secondary | ICD-10-CM | POA: Diagnosis not present

## 2018-07-09 DIAGNOSIS — J45909 Unspecified asthma, uncomplicated: Secondary | ICD-10-CM | POA: Insufficient documentation

## 2018-07-09 DIAGNOSIS — I4891 Unspecified atrial fibrillation: Secondary | ICD-10-CM | POA: Diagnosis not present

## 2018-07-09 NOTE — Progress Notes (Signed)
He is much more resilient than initially apparent. Thank you, MCr

## 2018-07-09 NOTE — Progress Notes (Signed)
Primary Care Physician: Shirley, [place], DO Referring Physician: [facility] ER f/u Cardiologist: Dr. Weber Cooks [place]umm[place] is a 78 y.o. male with a h/o  advanced liver cirrhosis and ah/o priorEtOH abuse(quit for 3-4 years) with portal HTN and ascites, atrial flutter with RVR in the past (not anticoagulated secondary to advanced liver disease), and CKDstage 3,.He was treated withAmiodarone for rate control and developed a junctional bradycardia, therefore this was discontinued and he was placed on Bisoprolol.  He was  seen in the ER 02/2018 with back and abdominal pain which has resolved. He was found to be in atrial flutter and was referred here. Initial EKG showed atrial  flutter at 104 bpm, however on exam today, HR was regular and in the 60's. Pt states that he is not symptomatic with flutter and it "comes and goes". He had f/u with Dr. Loletha Grayer about one month later, HR was elevated and bisoprolol was increased to 5 mg daily.  F/u in afib clinic, 1/9. He is asymptomatic and feels well but has v rate in the at 119 bpm, either accelerated junctional rhythm or atrial flutter. He is planning to go to [place] to visit his kids ia few weeks for one month.  F/u in afib clinic, 2/24. He has returned from 3 weeks in [place]. He wore a Zio patch for 1 week. It did show that he was in a flutter 100% of the time with average rates of 74 bpm. He did have daytime pauses of around 3 seconds from which he was asymptomatic.He also had a 4 beat runof v tach. He is on BB.  Surprisingly, he presents in Sinus brady today. He states that he feels great, "nothing wrong with me."  Today, he denies symptoms of palpitations, chest pain, shortness of breath, orthopnea, PND, lower extremity edema, dizziness, presyncope, syncope, or neurologic sequela. The patient is tolerating medications without difficulties and is otherwise without complaint today.   Past Medical History:  Diagnosis Date  . Acute congestive heart  failure ([place])   . Arthritis    "all over"  . Asthma   . Blood in stool 04/11/2017  . Chronic kidney disease    "I see a kidney dr @ [facility]" (10/11/2016)  . Chronic lower back pain    "turned a truck over a long time ago" (10/11/2016)  . Dysrhythmia    bradycardia with occassional junctional rhythm  . ETOH abuse   . Gout   . Hypertension   . Inguinal hernia 10/28/2014   S/P inguinal hernia repair with 4L as ascites removed 03/30/17  . Liver cirrhosis ([place]) 2015  . Macrocytosis   . Pneumonia    "long long time ago" (10/11/2016)  . Spontaneous bacterial peritonitis ([place]) 07/01/2015   Past Surgical History:  Procedure Laterality Date  . INGUINAL HERNIA REPAIR Bilateral 03/30/2017   Procedure: OPEN BILATERAL INGUINAL HERNIA REPAIR WITH MESH;  Surgeon: Kinsinger, Arta Bruce, MD;  [facility];  Service: General;  Laterality: Bilateral;  GENERAL COMBINED WITH REGIONAL FOR POST OP PAIN   . INSERTION OF MESH N/A 10/16/2016   Procedure: INSERTION OF MESH;  Surgeon: Kinsinger, Arta Bruce, MD;  Location: [facility];  Service: General;  Laterality: N/A;  . INSERTION OF MESH Bilateral 03/30/2017   Procedure: INSERTION OF MESH;  Surgeon: Kieth Brightly Arta Bruce, MD;  [facility];  Service: General;  Laterality: Bilateral;  GENERAL COMBINED WITH REGIONAL FOR POST OP PAIN   . IR PARACENTESIS  08/19/2016  . IR PARACENTESIS  08/26/2016  . IR PARACENTESIS  09/02/2016  . IR PARACENTESIS  09/09/2016  . IR PARACENTESIS  09/16/2016  . IR PARACENTESIS  10/03/2016  . IR PARACENTESIS  10/11/2016  . IR RADIOLOGIST EVAL & MGMT  09/29/2016  . IR RADIOLOGIST EVAL & MGMT  12/21/2016  . IR RADIOLOGIST EVAL & MGMT  01/23/2018  . IR TIPS  10/11/2016  . RADIOLOGY WITH ANESTHESIA N/A 10/11/2016   Procedure: TIPS;  Surgeon: Arne Cleveland, MD;  Location: [facility];  Service: Radiology;  Laterality: N/A;  . UMBILICAL HERNIA REPAIR N/A 10/16/2016   Procedure: HERNIA REPAIR UMBILICAL ADULT;  Surgeon: Kinsinger, Arta Bruce, MD;   Location: [facility];  Service: General;  Laterality: N/A;    Current Outpatient Medications  Medication Sig Dispense Refill  . albuterol (PROAIR HFA) 108 (90 Base) MCG/ACT inhaler Inhale 2 puffs into the lungs every 4 (four) hours as needed for wheezing. 1 Inhaler 2  . allopurinol (ZYLOPRIM) 100 MG tablet Take 0.5 tablets (50 mg total) by mouth daily. 90 tablet 0  . bisoprolol (ZEBETA) 10 MG tablet Take 1 tablet (10 mg total) by mouth daily. 30 tablet 3  . Cholecalciferol (VITAMIN D-3) 5000 units TABS Take 5,000 Units daily by mouth.     . ferrous sulfate 325 (65 FE) MG tablet Take 1 tablet (325 mg total) by mouth 2 (two) times daily with a meal. (Patient taking differently: Take 325 mg by mouth every Monday, Wednesday, and Friday. ) 60 tablet 3  . Fluticasone-Salmeterol (ADVAIR DISKUS) 500-50 MCG/DOSE AEPB Inhale 1 puff into the lungs 2 (two) times daily. 180 each 1  . Fluticasone-Salmeterol (WIXELA INHUB) 500-50 MCG/DOSE AEPB Inhale 1 puff into the lungs 2 (two) times daily. 60 each 3  . furosemide (LASIX) 40 MG tablet Take 1.5 tablets (60 mg total) by mouth 3 (three) times daily. Take 60 mg 1-3 times daily based on daily weight measurements. (Patient taking differently: Take 40 mg by mouth 3 (three) times daily. Take 40mg  1-3 times daily based on daily weight measurements.) 90 tablet 0  . lactulose (CHRONULAC) 10 GM/15ML solution TAKE 15 MLS BY MOUTH 2 (TWO) TIMES DAILY AS NEEDED (NEED TO ENSURE 2 SOFT BM IN A DAY.). 5676 mL 1  . montelukast (SINGULAIR) 10 MG tablet TAKE 1 TABLET BY MOUTH EVERYDAY AT BEDTIME (Patient taking differently: Take 10 mg by mouth at bedtime. ) 90 tablet 1  . Multiple Vitamin (MULTIVITAMIN WITH MINERALS) TABS tablet Take 1 tablet by mouth daily. 30 tablet 0  . Nutritional Supplements (ENSURE COMPLETE SHAKE) LIQD Take 1 Container by mouth daily. (Patient taking differently: Take 1 Container 2 (two) times daily by mouth. ) 237 mL 11  . spironolactone (ALDACTONE) 25 MG tablet  TAKE 1 TABLET BY MOUTH EVERY DAY 90 tablet 0   No current facility-administered medications for this encounter.     No Known Allergies  Social History   Socioeconomic History  . Marital status: Divorced    Spouse name: Not on file  . Number of children: 5  . Years of education: 10  . Highest education level: Not on file  Occupational History  . 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 Financial resource strain: Not on file  . Food insecurity:    Worry: Not on file    Inability: Not on file  . Transportation needs:    Medical: Not on file    Non-medical: Not on file  Tobacco Use  . Smoking status: Never Smoker  .  Smokeless tobacco: Current User    Types: Snuff, Chew  Substance and Sexual Activity  . Alcohol use: No    Comment: quit drinking "over 2 yr ago" (10/11/2016)  . Drug use: No  . Sexual activity: Not Currently  Lifestyle  . Physical activity:    Days per week: Not on file    Minutes per session: Not on file  . Stress: Not on file  Relationships  . Social connections:    Talks on phone: Not on file    Gets together: Not on file    Attends religious service: Not on file    Active member of club or organization: Not on file    Attends meetings of clubs or organizations: Not on file    Relationship status: Not on file  . Intimate partner violence:    Fear of current or ex partner: Not on file    Emotionally abused: Not on file    Physically abused: Not on file    Forced sexual activity: Not on file  Other Topics Concern  . Not on file  Social History Narrative   Lives with nephew and niece in [place] home.     Tobacco: snuff all day (can last two days. Never smoked.       Hobbies: cooking, sleeping, going to store   Pets:  Dog, Bean    Family History  Problem Relation Age of Onset  . Other Mother        died when pt was only 37 - unknown cause.  . Other Father        deceased.  . Asthma Sister   . Heart disease Sister        multiple  stents.  . Asthma Brother   . Hypertension Brother   . Asthma Son   . Asthma Brother   . Hypertension Brother     ROS- All systems are reviewed and negative except as per the HPI above  Physical Exam: Vitals:   07/09/18 0915  BP: 132/68  Pulse: (!) 47  Weight: 76.7 kg  Height: 5\' 11"  (1.803 m)   Wt Readings from Last 3 Encounters:  07/09/18 76.7 kg  05/24/18 78 kg  03/26/18 74.8 kg    Labs: Lab Results  Component Value Date   NA 139 02/19/2018   K 3.4 (L) 02/19/2018   CL 105 02/19/2018   CO2 26 02/19/2018   GLUCOSE 127 (H) 02/19/2018   BUN 18 02/19/2018   CREATININE 1.63 (H) 02/19/2018   CALCIUM 9.4 02/19/2018   PHOS 3.7 04/13/2017   MG 2.2 04/13/2017   Lab Results  Component Value Date   INR 1.28 02/19/2018   Lab Results  Component Value Date   CHOL 141 11/11/2011   HDL 71 11/11/2011   LDLCALC 44 11/11/2011   TRIG 128 11/11/2011     GEN- The patient is well appearing, alert and oriented x 3 today.   Head- normocephalic, atraumatic Eyes-  Sclera clear, conjunctiva pink Ears- hearing intact Oropharynx- clear Neck- supple, no JVP Lymph- no cervical lymphadenopathy Lungs- Clear to ausculation bilaterally, normal work of breathing Heart- Regular rate and rhythm, no murmurs, rubs or gallops, PMI not laterally displaced GI- soft, NT, ND, + BS Extremities- no clubbing, cyanosis, or edema MS- no significant deformity or atrophy Skin- no rash or lesion Psych- euthymic mood, full affect Neuro- strength and sensation are intact  EKG- sinus brady with first degree AV block, at 47 bpm, Pr int 210 ms, qrs  int 100 ms, qtc 428 ms Echo-Study Conclusions  - Left ventricle: The cavity size was normal. Systolic function was   normal. The estimated ejection fraction was in the range of 60%   to 65%. Wall motion was normal; there were no regional wall   motion abnormalities. Severe bradycardia precludes evaluation of   LV diastolic function. - Ventricular septum:  The contour showed diastolic flattening and   systolic flattening. These changes are consistent with RV volume   and pressure overload. - Aortic valve: There was trivial regurgitation. - Mitral valve: There was mild regurgitation directed centrally. - Left atrium: The atrium was mildly dilated. - Right ventricle: The cavity size was severely dilated. Wall   thickness was normal. - Right atrium: The atrium was severely dilated. - Atrial septum: No defect or patent foramen ovale was identified. - Tricuspid valve: There was moderate-severe regurgitation. There   was evidence of perivalvular regurgitation. - Pulmonary arteries: Systolic pressure was moderately to severely   increased. PA peak pressure: 67 mm Hg (S). Zio patch- Study Highlights   Atrial flutter is noted for 100% of monitoring period Average V rates 74 bpm (range 38-129 bpm) Six pauses of 3.2-3.4 seconds duration are noted (daytime) 4 beat run of nonsustained ventricular tachycardia is noted     Assessment and Plan: 1. Atrial flutter  Longstanding and intermittent, asymptomatic He was in flutter 100% time while wearing  Monitor In sinus brady today Continue bisoprolol at 10 mg daily Not anticoagulated secondary to advanced liver disease   F/u here with Dr. Loletha Grayer in May per recall and I wil see back in October   Deionte Spivack C. Justis Dupas, [facility] [address] [place], [place] [address] [phone]

## 2018-07-12 DIAGNOSIS — R310 Gross hematuria: Secondary | ICD-10-CM | POA: Diagnosis not present

## 2018-07-27 ENCOUNTER — Other Ambulatory Visit: Payer: Self-pay

## 2018-07-27 ENCOUNTER — Encounter: Payer: Self-pay | Admitting: Family Medicine

## 2018-07-27 ENCOUNTER — Ambulatory Visit (INDEPENDENT_AMBULATORY_CARE_PROVIDER_SITE_OTHER): Payer: Medicare Other | Admitting: Family Medicine

## 2018-07-27 VITALS — BP 140/80 | HR 43 | Temp 99.1°F | Wt 173.4 lb

## 2018-07-27 DIAGNOSIS — I10 Essential (primary) hypertension: Secondary | ICD-10-CM

## 2018-07-27 DIAGNOSIS — I483 Typical atrial flutter: Secondary | ICD-10-CM | POA: Diagnosis not present

## 2018-07-27 DIAGNOSIS — N183 Chronic kidney disease, stage 3 unspecified: Secondary | ICD-10-CM

## 2018-07-27 DIAGNOSIS — I509 Heart failure, unspecified: Secondary | ICD-10-CM | POA: Diagnosis not present

## 2018-07-27 MED ORDER — LACTULOSE 10 GM/15ML PO SOLN: ORAL | 1 refills | Status: DC

## 2018-07-27 MED ORDER — SPIRONOLACTONE 25 MG PO TABS: 25.0000 mg | ORAL_TABLET | Freq: Every day | ORAL | 0 refills | Status: DC

## 2018-07-27 MED ORDER — MONTELUKAST SODIUM 10 MG PO TABS: ORAL_TABLET | ORAL | 1 refills | Status: DC

## 2018-07-27 NOTE — Progress Notes (Signed)
Subjective:  Patient ID: Donald Moreno  DOB: August 30, 1940 MRN: [id number]  Donald Moreno is a 78 y.o. male with a PMH of CKD stage III (followed by [facility]), alcohol induced cirrhosis of the liver, gout, asthma, HTN, OA, atrial flutter, HFpEF, here today for follow-up.   HPI:  HFpEF: - last seen in the office on 02/27/18 and continuingweight based plan:lasix tid for wt >168lb, lasix bid for wt 163-168lb, and lasix once daily for wt <163lb. - he weighed166lbthis morning on his home scale - patient reports still having normal urine output, states he goes all the time - also states that he is feeling great and walking everyday like he usually does - takes spironolactone daily  - low salt diet reinforced, compression stockingsmost days -NIO:EVOJJK scrotal swelling, uses 2 pillows at night which is his normal, and denies any leg swelling  Hypertension: - Medications: Metoprolol 10 mg daily, Lasix as above, spironolactone 25 mg daily - Compliance: Yes - Checking BP at home: No - Denies any SOB, CP, vision changes, LE edema, medication SEs, or symptoms of hypotension - Diet: Patient attempts to have a low-sodium diet - Exercise: Patient walks as much as he can daily  CKD stage III Patient denies any symptoms of uremia.  States that he has follow-up with [facility] within the next month and will have labs done when he goes to that visit.  ROS: All other systems otherwise negative, except as mentioned in HPI  Family History  Problem Relation Age of Onset  . Other Mother        died when pt was only 73 - unknown cause.  . Other Father        deceased.  . Asthma Sister   . Heart disease Sister        multiple stents.  . Asthma Brother   . Hypertension Brother   . Asthma Son   . Asthma Brother   . Hypertension Brother     Social hx: Denies use of illicit drugs, alcohol use Smoking status reviewed  Patient Active Problem List   Diagnosis Date Noted  . History  of anemia due to CKD 02/20/2018  . Secondary hyperparathyroidism of renal origin ([place]) 02/20/2018  . Corn or callus 07/11/2017  . Macrocytic anemia 04/20/2017  . Cirrhosis of liver with ascites ([place])   . Congestive heart failure ([place])   . Hematuria 01/30/2017  . Alcoholic cirrhosis of liver with ascites ([place])   . Typical atrial flutter ([place])   . CKD (chronic kidney disease) stage 3, GFR 30-59 ml/min (HCC) 05/27/2015  . At risk for decreased bone density 03/04/2015  . Bradycardia 07/08/2013  . Advanced directives, counseling/discussion 01/03/2011  . Asthma 01/29/2010  . Gout 07/13/2006  . HYPERTENSION, BENIGN SYSTEMIC 07/13/2006  . Arthritis 07/13/2006     Objective:  BP 140/80   Pulse (!) 43   Temp 99.1 F (37.3 C) (Oral)   Wt 173 lb 6.4 oz (78.7 kg)   SpO2 97%   BMI 24.18 kg/m   Vitals and nursing note reviewed  General: NAD, pleasant Cardiac: Bradycardia with regular rhythm, normal heart sounds, no m/r/g Pulm: normal effort, CTAB Extremities: no edema or cyanosis. WWP. Skin: warm and dry, no rashes noted Neuro: alert and oriented, no focal deficits Psych: normal affect, normal thought content  Assessment & Plan:   CKD (chronic kidney disease) stage 3, GFR 30-59 ml/min (HCC) Patient has appointment with [facility] next month.  He is taking renally adjusted dose of  allopurinol at 50 mg/day.  Will check CMP and CBC today.  Per last note from [facility] they will check his PTH and vitamin D level prior to his next appointment.  HYPERTENSION, BENIGN SYSTEMIC Patient's BP 140/80 today.  Patient endorses being compliant with low-sodium diet and walks daily.  He reports compliance with his medications.  Congestive heart failure ([place]) Patient euvolemic today.  Reports that his scale showed a weight of 166 this morning.  States that he is still wearing compression stockings and tolerating low-salt diet. -CMP -Continue aspirin lactone and Lasix per weight-based  management  Typical atrial flutter [facility]) Patient recently seen by cardiologist who recommended continuing current management with bisoprolol 10 mg.  Patient reports compliance with this medication.  He is bradycardic today during the office but is asymptomatic, and is within his previously tolerated range of bradycardia.   Donald Genavive Kubicki, DO Family Medicine Resident PGY-2

## 2018-07-27 NOTE — Assessment & Plan Note (Signed)
Patient recently seen by cardiologist who recommended continuing current management with bisoprolol 10 mg.  Patient reports compliance with this medication.  He is bradycardic today during the office but is asymptomatic, and is within his previously tolerated range of bradycardia.

## 2018-07-27 NOTE — Patient Instructions (Signed)
Thank you for coming to see me today. It was a pleasure! Today we talked about:   Continue taking your medications the same as you are now. I will call you with you blood work and let you know if you need to make any Adjustments.  Please follow-up with me in 3 months or sooner as needed.  If you have any questions or concerns, please do not hesitate to call the office at [phone].  Take Care,   [place] Ekansh Sherk, DO

## 2018-07-27 NOTE — Assessment & Plan Note (Signed)
Patient has appointment with [facility] next month.  He is taking renally adjusted dose of allopurinol at 50 mg/day.  Will check CMP and CBC today.  Per last note from [facility] they will check his PTH and vitamin D level prior to his next appointment.

## 2018-07-27 NOTE — Assessment & Plan Note (Signed)
Patient euvolemic today.  Reports that his scale showed a weight of 166 this morning.  States that he is still wearing compression stockings and tolerating low-salt diet. -CMP -Continue aspirin lactone and Lasix per weight-based management

## 2018-07-27 NOTE — Assessment & Plan Note (Signed)
Patient's BP 140/80 today.  Patient endorses being compliant with low-sodium diet and walks daily.  He reports compliance with his medications.

## 2018-07-28 LAB — COMPREHENSIVE METABOLIC PANEL
ALK PHOS: 85 IU/L (ref 39–117)
ALT: 14 IU/L (ref 0–44)
AST: 23 IU/L (ref 0–40)
Albumin/Globulin Ratio: 1 — ABNORMAL LOW (ref 1.2–2.2)
Albumin: 3.6 g/dL — ABNORMAL LOW (ref 3.7–4.7)
BUN/Creatinine Ratio: 12 (ref 10–24)
BUN: 23 mg/dL (ref 8–27)
Bilirubin Total: 1.4 mg/dL — ABNORMAL HIGH (ref 0.0–1.2)
CALCIUM: 9.9 mg/dL (ref 8.6–10.2)
CO2: 20 mmol/L (ref 20–29)
CREATININE: 2 mg/dL — AB (ref 0.76–1.27)
Chloride: 106 mmol/L (ref 96–106)
GFR calc Af Amer: 36 mL/min/{1.73_m2} — ABNORMAL LOW (ref >59)
GFR, EST NON AFRICAN AMERICAN: 31 mL/min/{1.73_m2} — AB (ref >59)
GLUCOSE: 93 mg/dL (ref 65–99)
Globulin, Total: 3.7 g/dL (ref 1.5–4.5)
Potassium: 4.2 mmol/L (ref 3.5–5.2)
Sodium: 140 mmol/L (ref 134–144)
Total Protein: 7.3 g/dL (ref 6.0–8.5)

## 2018-07-28 LAB — CBC
HEMOGLOBIN: 11.2 g/dL — AB (ref 13.0–17.7)
Hematocrit: 31 % — ABNORMAL LOW (ref 37.5–51.0)
MCH: 35.3 pg — AB (ref 26.6–33.0)
MCHC: 36.1 g/dL — ABNORMAL HIGH (ref 31.5–35.7)
MCV: 98 fL — AB (ref 79–97)
PLATELETS: 171 10*3/uL (ref 150–450)
RBC: 3.17 x10E6/uL — AB (ref 4.14–5.80)
RDW: 11.7 % (ref 11.6–15.4)
WBC: 7.3 10*3/uL (ref 3.4–10.8)

## 2018-08-09 DIAGNOSIS — K7460 Unspecified cirrhosis of liver: Secondary | ICD-10-CM | POA: Diagnosis not present

## 2018-08-16 ENCOUNTER — Encounter: Payer: Self-pay | Admitting: Podiatry

## 2018-08-16 ENCOUNTER — Other Ambulatory Visit: Payer: Self-pay

## 2018-08-16 ENCOUNTER — Ambulatory Visit: Payer: Medicare Other | Admitting: Podiatry

## 2018-08-16 VITALS — Temp 97.3°F

## 2018-08-16 DIAGNOSIS — B351 Tinea unguium: Secondary | ICD-10-CM | POA: Diagnosis not present

## 2018-08-16 DIAGNOSIS — M79609 Pain in unspecified limb: Principal | ICD-10-CM

## 2018-08-16 DIAGNOSIS — M79676 Pain in unspecified toe(s): Secondary | ICD-10-CM | POA: Diagnosis not present

## 2018-08-16 NOTE — Progress Notes (Signed)
Subjective:  Patient ID: Donald Moreno, male    DOB: [birth year],  MRN: [id number]  Chief Complaint  Patient presents with  . Debridement    Trim toenails    78 y.o. male presents with the above complaint.  Reports painfully elongated nails to both feet.  Review of Systems: Negative except as noted in the HPI. Denies N/V/F/Ch.  Past Medical History:  Diagnosis Date  . Acute congestive heart failure ([place])   . Arthritis    "all over"  . Asthma   . Blood in stool 04/11/2017  . Chronic kidney disease    "I see a kidney dr @ [facility]" (10/11/2016)  . Chronic lower back pain    "turned a truck over a long time ago" (10/11/2016)  . Dysrhythmia    bradycardia with occassional junctional rhythm  . ETOH abuse   . Gout   . Hypertension   . Inguinal hernia 10/28/2014   S/P inguinal hernia repair with 4L as ascites removed 03/30/17  . Liver cirrhosis ([place]) 2015  . Macrocytosis   . Pneumonia    "long long time ago" (10/11/2016)  . Spontaneous bacterial peritonitis ([place]) 07/01/2015    Current Outpatient Medications:  .  albuterol (PROAIR HFA) 108 (90 Base) MCG/ACT inhaler, Inhale 2 puffs into the lungs every 4 (four) hours as needed for wheezing., Disp: 1 Inhaler, Rfl: 2 .  allopurinol (ZYLOPRIM) 100 MG tablet, Take 0.5 tablets (50 mg total) by mouth daily., Disp: 90 tablet, Rfl: 0 .  bisoprolol (ZEBETA) 10 MG tablet, Take 1 tablet (10 mg total) by mouth daily., Disp: 30 tablet, Rfl: 3 .  Cholecalciferol (VITAMIN D-3) 5000 units TABS, Take 5,000 Units daily by mouth. , Disp: , Rfl:  .  ferrous sulfate 325 (65 FE) MG tablet, Take 1 tablet (325 mg total) by mouth 2 (two) times daily with a meal. (Patient taking differently: Take 325 mg by mouth every Monday, Wednesday, and Friday. ), Disp: 60 tablet, Rfl: 3 .  Fluticasone-Salmeterol (WIXELA INHUB) 500-50 MCG/DOSE AEPB, Inhale 1 puff into the lungs 2 (two) times daily., Disp: 60 each, Rfl: 3 .  furosemide (LASIX) 40 MG tablet, Take 1.5  tablets (60 mg total) by mouth 3 (three) times daily. Take 60 mg 1-3 times daily based on daily weight measurements. (Patient taking differently: Take 40 mg by mouth 3 (three) times daily. Take 40mg  1-3 times daily based on daily weight measurements.), Disp: 90 tablet, Rfl: 0 .  lactulose (CHRONULAC) 10 GM/15ML solution, TAKE 15 MLS BY MOUTH 2 (TWO) TIMES DAILY AS NEEDED (NEED TO ENSURE 2 SOFT BM IN A DAY.)., Disp: 5676 mL, Rfl: 1 .  montelukast (SINGULAIR) 10 MG tablet, TAKE 1 TABLET BY MOUTH EVERYDAY AT BEDTIME, Disp: 90 tablet, Rfl: 1 .  Multiple Vitamin (MULTIVITAMIN WITH MINERALS) TABS tablet, Take 1 tablet by mouth daily., Disp: 30 tablet, Rfl: 0 .  Nutritional Supplements (ENSURE COMPLETE SHAKE) LIQD, Take 1 Container by mouth daily. (Patient taking differently: Take 1 Container 2 (two) times daily by mouth. ), Disp: 237 mL, Rfl: 11 .  spironolactone (ALDACTONE) 25 MG tablet, Take 1 tablet (25 mg total) by mouth daily., Disp: 90 tablet, Rfl: 0  Social History   Tobacco Use  Smoking Status Never Smoker  Smokeless Tobacco Current User  . Types: Snuff, Chew    No Known Allergies Objective:   Vitals:   08/16/18 1026  Temp: (!) 97.3 F (36.3 C)   There is no height or weight on file to calculate  BMI. Constitutional Well developed. Well nourished.  Vascular Dorsalis pedis pulses palpable bilaterally. Posterior tibial pulses palpable bilaterally. Capillary refill normal to all digits.  No cyanosis or clubbing noted. Pedal hair growth normal.  Neurologic Normal speech. Oriented to person, place, and time. Epicritic sensation to light touch grossly present bilaterally.  Dermatologic Nails elongated dystrophic pain to palpation No open wounds. No skin lesions.  Orthopedic: Normal joint ROM without pain or crepitus bilaterally. No visible deformities. No bony tenderness.   Radiographs: None Assessment:   1. Pain due to onychomycosis of nail    Plan:  Patient was evaluated  and treated and all questions answered.  Onychomycosis with pain -Nails palliatively debridement as below -Educated on self-care  Procedure: Nail Debridement Rationale: Pain Type of Debridement: manual, sharp debridement. Instrumentation: Nail nipper, rotary burr. Number of Nails: 10    Return if symptoms worsen or fail to improve, for Routine Foot Care.

## 2018-08-31 ENCOUNTER — Other Ambulatory Visit: Payer: Self-pay | Admitting: Specialist

## 2018-08-31 ENCOUNTER — Other Ambulatory Visit: Payer: Self-pay | Admitting: Gastroenterology

## 2018-08-31 DIAGNOSIS — K7460 Unspecified cirrhosis of liver: Secondary | ICD-10-CM

## 2018-09-01 ENCOUNTER — Other Ambulatory Visit (HOSPITAL_COMMUNITY): Payer: Self-pay | Admitting: Nurse Practitioner

## 2018-09-06 DIAGNOSIS — N183 Chronic kidney disease, stage 3 (moderate): Secondary | ICD-10-CM | POA: Diagnosis not present

## 2018-09-06 DIAGNOSIS — D631 Anemia in chronic kidney disease: Secondary | ICD-10-CM | POA: Diagnosis not present

## 2018-09-06 DIAGNOSIS — N2581 Secondary hyperparathyroidism of renal origin: Secondary | ICD-10-CM | POA: Diagnosis not present

## 2018-09-06 DIAGNOSIS — I129 Hypertensive chronic kidney disease with stage 1 through stage 4 chronic kidney disease, or unspecified chronic kidney disease: Secondary | ICD-10-CM | POA: Diagnosis not present

## 2018-09-07 ENCOUNTER — Other Ambulatory Visit: Payer: Self-pay | Admitting: Family Medicine

## 2018-09-07 DIAGNOSIS — I509 Heart failure, unspecified: Secondary | ICD-10-CM

## 2018-09-24 ENCOUNTER — Telehealth: Payer: Medicare Other | Admitting: Cardiovascular Disease

## 2018-09-24 ENCOUNTER — Telehealth: Payer: Self-pay | Admitting: Cardiovascular Disease

## 2018-09-24 NOTE — Telephone Encounter (Signed)
Home phone/ declined my chart/ consent/ pre reg completed

## 2018-09-25 ENCOUNTER — Encounter: Payer: Self-pay | Admitting: Cardiovascular Disease

## 2018-09-25 ENCOUNTER — Telehealth (INDEPENDENT_AMBULATORY_CARE_PROVIDER_SITE_OTHER): Payer: Medicare Other | Admitting: Cardiovascular Disease

## 2018-09-25 VITALS — BP 166/83 | HR 55 | Ht 71.0 in | Wt 166.2 lb

## 2018-09-25 DIAGNOSIS — N183 Chronic kidney disease, stage 3 unspecified: Secondary | ICD-10-CM

## 2018-09-25 DIAGNOSIS — K7031 Alcoholic cirrhosis of liver with ascites: Secondary | ICD-10-CM

## 2018-09-25 DIAGNOSIS — I5022 Chronic systolic (congestive) heart failure: Secondary | ICD-10-CM

## 2018-09-25 DIAGNOSIS — I483 Typical atrial flutter: Secondary | ICD-10-CM | POA: Diagnosis not present

## 2018-09-25 DIAGNOSIS — I10 Essential (primary) hypertension: Secondary | ICD-10-CM

## 2018-09-25 NOTE — Patient Instructions (Signed)
Medication Instructions:  Your physician recommends that you continue on your current medications as directed. Please refer to the Current Medication list given to you today.  If you need a refill on your cardiac medications before your next appointment, please call your pharmacy.   Follow-Up: At [facility], you and your health needs are our priority.  As part of our continuing mission to provide you with exceptional heart care, we have created designated Provider Care Teams.  These Care Teams include your primary Cardiologist (physician) and Advanced Practice Providers (APPs -  Physician Assistants and Nurse Practitioners) who all work together to provide you with the care you need, when you need it. You will need a follow up appointment in 12 months.  Please call our office 2 months in advance to schedule this appointment.  You may see Sanda Klein, MD or one of the following Advanced Practice Providers on your designated Care Team: [place], [place] . Fabian Sharp, PA-C  Any Other Special Instructions Will Be Listed Below (If Applicable). None

## 2018-09-25 NOTE — Progress Notes (Signed)
Virtual Visit via Telephone Note   This visit type was conducted due to national recommendations for restrictions regarding the COVID-19 Pandemic (e.g. social distancing) in an effort to limit this patient's exposure and mitigate transmission in our community.  Due to his co-morbid illnesses, this patient is at least at moderate risk for complications without adequate follow up.  This format is felt to be most appropriate for this patient at this time.  The patient did not have access to video technology/had technical difficulties with video requiring transitioning to audio format only (telephone).  All issues noted in this document were discussed and addressed.  No physical exam could be performed with this format.  Please refer to the patient's chart for his  consent to telehealth for [facility]   Date:  09/25/2018   ID:  Donald Moreno, DOB 01/17/1941, MRN [id number]  Patient Location: Home Provider Location: Home  PCP:  Shirley, [place], DO  Cardiologist:  Sanda Klein, MD  Electrophysiologist:  None   Evaluation Performed:  Follow-Up Visit  Chief Complaint:  AFlutter, CHF  History of Present Illness:    Donald Moreno is a 78 y.o. male with recurrent atrial flutter with rapid ventricular response, associated with congestive heart failure with preserved left ventricular systolic function, markedly improved after arrhythmia control.  He was initially managed with amiodarone but this had to be stopped due to junctional rhythm.  He has advanced chronic liver disease (cirrhosis, history of multiple paracentesis for massive ascites, improved after TIPS).  Because of his liver problems with spontaneously elevated prothrombin time, he is not anticoagulated.  He was seen in the A. fib clinic on February 24 and was in sinus bradycardia at 47 bpm, with first-degree AV block (PR 210 ms), narrow QRS, QTC 4 and 28 ms.  On the other hand while he wore an event monitor in January 2020, he had  persistent atrial flutter with rates of 38-129 bpm, average 74 bpm and occasional pauses over 3 seconds.  He does not appear to be aware of the arrhythmia.  He checks his blood pressure periodically and reports that his heart rate is usually in the 50s.  He denies dizziness or syncope.  He no longer has ascites.  If he wears his compression stockings he does not have lower extremity edema.  He walks twice a day for 30 minutes at a time and denies chest pain or dyspnea.  He has not had bleeding problems.  It does not sound like he had any issues with encephalopathy, although he does reports that he sleeps very little.  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  He lives with his niece who is a Dietitian.  He reports that she is extremely careful and washing her hands and practicing social isolation.  The patient does not have symptoms concerning for COVID-19 infection (fever, chills, cough, or new shortness of breath).    Past Medical History:  Diagnosis Date  . Acute congestive heart failure ([place])   . Arthritis    "all over"  . Asthma   . Blood in stool 04/11/2017  . Chronic kidney disease    "I see a kidney dr @ [facility]" (10/11/2016)  . Chronic lower back pain    "turned a truck over a long time ago" (10/11/2016)  . Dysrhythmia    bradycardia with occassional junctional rhythm  . ETOH abuse   . Gout   .  Hypertension   . Inguinal hernia 10/28/2014   S/P inguinal hernia repair with 4L as ascites removed 03/30/17  . Liver cirrhosis ([place]) 2015  . Macrocytosis   . Pneumonia    "long long time ago" (10/11/2016)  . Spontaneous bacterial peritonitis ([place]) 07/01/2015   Past Surgical History:  Procedure Laterality Date  . INGUINAL HERNIA REPAIR Bilateral 03/30/2017   Procedure: OPEN BILATERAL INGUINAL HERNIA REPAIR WITH MESH;  Surgeon: Kinsinger, Arta Bruce, MD;  Location: [facility];  Service: General;  Laterality:  Bilateral;  GENERAL COMBINED WITH REGIONAL FOR POST OP PAIN   . INSERTION OF MESH N/A 10/16/2016   Procedure: INSERTION OF MESH;  Surgeon: Kinsinger, Arta Bruce, MD;  Location: [place];  Service: General;  Laterality: N/A;  . INSERTION OF MESH Bilateral 03/30/2017   Procedure: INSERTION OF MESH;  Surgeon: Kieth Brightly Arta Bruce, MD;  Location: [facility];  Service: General;  Laterality: Bilateral;  GENERAL COMBINED WITH REGIONAL FOR POST OP PAIN   . IR PARACENTESIS  08/19/2016  . IR PARACENTESIS  08/26/2016  . IR PARACENTESIS  09/02/2016  . IR PARACENTESIS  09/09/2016  . IR PARACENTESIS  09/16/2016  . IR PARACENTESIS  10/03/2016  . IR PARACENTESIS  10/11/2016  . IR RADIOLOGIST EVAL & MGMT  09/29/2016  . IR RADIOLOGIST EVAL & MGMT  12/21/2016  . IR RADIOLOGIST EVAL & MGMT  01/23/2018  . IR TIPS  10/11/2016  . RADIOLOGY WITH ANESTHESIA N/A 10/11/2016   Procedure: TIPS;  Surgeon: Arne Cleveland, MD;  Location: [place];  Service: Radiology;  Laterality: N/A;  . UMBILICAL HERNIA REPAIR N/A 10/16/2016   Procedure: HERNIA REPAIR UMBILICAL ADULT;  Surgeon: Kinsinger, Arta Bruce, MD;  Location: [place];  Service: General;  Laterality: N/A;     Current Meds  Medication Sig  . albuterol (PROAIR HFA) 108 (90 Base) MCG/ACT inhaler Inhale 2 puffs into the lungs every 4 (four) hours as needed for wheezing.  Marland Kitchen allopurinol (ZYLOPRIM) 100 MG tablet Take 0.5 tablets (50 mg total) by mouth daily.  . bisoprolol (ZEBETA) 10 MG tablet TAKE 1 TABLET BY MOUTH EVERY DAY  . Cholecalciferol (VITAMIN D-3) 5000 units TABS Take 5,000 Units daily by mouth.   . ferrous sulfate 325 (65 FE) MG tablet Take 1 tablet (325 mg total) by mouth 2 (two) times daily with a meal. (Patient taking differently: Take 325 mg by mouth every Monday, Wednesday, and Friday. )  . Fluticasone-Salmeterol (WIXELA INHUB) 500-50 MCG/DOSE AEPB Inhale 1 puff into the lungs 2 (two) times daily.  . furosemide (LASIX) 40 MG tablet 1 TAB 3 TIMES A DAY FOR WT >168LB, 2 TIMES A DAY  FOR WT 163-168LB, ONCE DAILY FOR WT <163LB  . lactulose (CHRONULAC) 10 GM/15ML solution TAKE 15 MLS BY MOUTH 2 (TWO) TIMES DAILY AS NEEDED (NEED TO ENSURE 2 SOFT BM IN A DAY.).  Marland Kitchen montelukast (SINGULAIR) 10 MG tablet TAKE 1 TABLET BY MOUTH EVERYDAY AT BEDTIME  . Multiple Vitamin (MULTIVITAMIN WITH MINERALS) TABS tablet Take 1 tablet by mouth daily.  . Nutritional Supplements (ENSURE COMPLETE SHAKE) LIQD Take 1 Container by mouth daily. (Patient taking differently: Take 1 Container 2 (two) times daily by mouth. )  . spironolactone (ALDACTONE) 25 MG tablet Take 1 tablet (25 mg total) by mouth daily.     Allergies:   Patient has no known allergies.   Social History   Tobacco Use  . Smoking status: Never Smoker  . Smokeless tobacco: Current User    Types: Snuff, Chew  Substance Use Topics  . Alcohol use: No    Comment: quit drinking "over 2 yr ago" (10/11/2016)  . Drug use: No     Family Hx: The patient's family history includes Asthma in his brother, brother, sister, and son; Heart disease in his sister; Hypertension in his brother and brother; Other in his father and mother.  ROS:   Please see the history of present illness.     All other systems reviewed and are negative.   Prior CV studies:   The following studies were reviewed today:  7-day event monitor in January2020, described above  Labs/Other Tests and Data Reviewed:    EKG:  An ECG dated 07/09/2018 was personally reviewed today and demonstrated:  Sinus bradycardia, first-degree AV block, possible left ventricular hypertrophy, but without repolarization changes.  Recent Labs: 07/27/2018: ALT 14; BUN 23; Creatinine, Ser 2.00; Hemoglobin 11.2; Platelets 171; Potassium 4.2; Sodium 140   Recent Lipid Panel Lab Results  Component Value Date/Time   CHOL 141 11/11/2011 10:30 AM   TRIG 128 11/11/2011 10:30 AM   HDL 71 11/11/2011 10:30 AM   CHOLHDL 2.0 11/11/2011 10:30 AM   LDLCALC 44 11/11/2011 10:30 AM    Wt Readings  from Last 3 Encounters:  09/25/18 166 lb 3.2 oz (75.4 kg)  07/27/18 173 lb 6.4 oz (78.7 kg)  07/09/18 169 lb (76.7 kg)     Objective:    Vital Signs:  BP (!) 166/83   Pulse (!) 55   Ht 5\' 11"  (1.803 m)   Wt 166 lb 3.2 oz (75.4 kg)   SpO2 98%   BMI 23.18 kg/m    VITAL SIGNS:  reviewed Unable to examine  ASSESSMENT & PLAN:    1. A Flutter: He has long periods of persistent atrial flutter that is generally well rate controlled and asymptomatic.  Unable to anticoagulate due to chronic liver disease.  Had elevated prothrombin time without medications. 2. CHF: As long as his rhythm/rate is well controlled he does not have any complaints of heart failure. 3. Cirrhosis: He seems to have excellent quality of life and is compliant with follow-up.  He is not drinking alcohol. 4. CKD 3: Baseline seems to be creatinine of 1.6-2.0, GFR 40-45, most recent creatinine was 2.0.  COVID-19 Education: The signs and symptoms of COVID-19 were discussed with the patient and how to seek care for testing (follow up with PCP or arrange E-visit).  The importance of social distancing was discussed today.  Time:   Today, I have spent 21 minutes with the patient with telehealth technology discussing the above problems.     Medication Adjustments/Labs and Tests Ordered: Current medicines are reviewed at length with the patient today.  Concerns regarding medicines are outlined above.   Tests Ordered: No orders of the defined types were placed in this encounter.   Medication Changes: No orders of the defined types were placed in this encounter.   Disposition:  Follow up 12 months  Signed, Sanda Klein, MD  09/25/2018 9:24 AM    [place]

## 2018-09-28 ENCOUNTER — Telehealth: Payer: Medicare Other | Admitting: Cardiovascular Disease

## 2018-10-06 ENCOUNTER — Other Ambulatory Visit: Payer: Self-pay | Admitting: Family Medicine

## 2018-10-06 DIAGNOSIS — I509 Heart failure, unspecified: Secondary | ICD-10-CM

## 2018-10-17 ENCOUNTER — Other Ambulatory Visit: Payer: Self-pay | Admitting: Family Medicine

## 2018-10-17 DIAGNOSIS — I509 Heart failure, unspecified: Secondary | ICD-10-CM

## 2018-10-19 ENCOUNTER — Ambulatory Visit
Admission: RE | Admit: 2018-10-19 | Discharge: 2018-10-19 | Disposition: A | Discharge status: Home / Self Care | Payer: Medicare Other | Source: Ambulatory Visit | Attending: Gastroenterology | Admitting: Gastroenterology

## 2018-10-19 DIAGNOSIS — K8020 Calculus of gallbladder without cholecystitis without obstruction: Secondary | ICD-10-CM | POA: Diagnosis not present

## 2018-10-19 DIAGNOSIS — K7460 Unspecified cirrhosis of liver: Secondary | ICD-10-CM

## 2018-10-29 ENCOUNTER — Telehealth: Payer: Self-pay | Admitting: Cardiovascular Disease

## 2018-10-29 NOTE — Telephone Encounter (Signed)
Left a message for the patient to call back.  

## 2018-10-29 NOTE — Telephone Encounter (Signed)
New Message:    Nurse [facility] calling concering HR normal which 50 57 around that last 2 days its 115 106 she is having some concerns. Patient call the patient   91/54 HR 106. Nurse feel like he needs to speak with some.

## 2018-11-01 ENCOUNTER — Other Ambulatory Visit: Payer: Self-pay | Admitting: Family Medicine

## 2018-11-01 DIAGNOSIS — I509 Heart failure, unspecified: Secondary | ICD-10-CM

## 2018-11-05 NOTE — Telephone Encounter (Signed)
Spoke with pt, he reports every once in a while he notices his heart rate elevated but he can not tell me how often or how long it last. He reports he feels fine and he knows to call if heart rate stays elevated or if he were to have problems.

## 2018-11-26 ENCOUNTER — Other Ambulatory Visit: Payer: Self-pay | Admitting: Family Medicine

## 2018-11-26 DIAGNOSIS — I509 Heart failure, unspecified: Secondary | ICD-10-CM

## 2018-12-20 ENCOUNTER — Other Ambulatory Visit: Payer: Self-pay | Admitting: Family Medicine

## 2018-12-20 DIAGNOSIS — I509 Heart failure, unspecified: Secondary | ICD-10-CM

## 2018-12-21 ENCOUNTER — Other Ambulatory Visit: Payer: Self-pay | Admitting: Family Medicine

## 2018-12-22 ENCOUNTER — Other Ambulatory Visit: Payer: Self-pay | Admitting: Family Medicine

## 2018-12-23 ENCOUNTER — Other Ambulatory Visit: Payer: Self-pay | Admitting: Family Medicine

## 2018-12-23 DIAGNOSIS — I509 Heart failure, unspecified: Secondary | ICD-10-CM

## 2018-12-24 ENCOUNTER — Other Ambulatory Visit: Payer: Self-pay

## 2019-01-08 ENCOUNTER — Other Ambulatory Visit: Payer: Self-pay | Admitting: Family Medicine

## 2019-01-08 DIAGNOSIS — I509 Heart failure, unspecified: Secondary | ICD-10-CM

## 2019-01-26 ENCOUNTER — Emergency Department (HOSPITAL_COMMUNITY)
Admission: EM | Admit: 2019-01-26 | Discharge: 2019-01-26 | Disposition: A | Discharge status: Home / Self Care | Payer: Medicare Other | Attending: Emergency Medicine | Admitting: Emergency Medicine

## 2019-01-26 ENCOUNTER — Other Ambulatory Visit: Payer: Self-pay

## 2019-01-26 ENCOUNTER — Encounter (HOSPITAL_COMMUNITY): Payer: Self-pay

## 2019-01-26 DIAGNOSIS — I4892 Unspecified atrial flutter: Secondary | ICD-10-CM | POA: Insufficient documentation

## 2019-01-26 DIAGNOSIS — F17220 Nicotine dependence, chewing tobacco, uncomplicated: Secondary | ICD-10-CM | POA: Insufficient documentation

## 2019-01-26 DIAGNOSIS — I10 Essential (primary) hypertension: Secondary | ICD-10-CM | POA: Diagnosis not present

## 2019-01-26 DIAGNOSIS — I129 Hypertensive chronic kidney disease with stage 1 through stage 4 chronic kidney disease, or unspecified chronic kidney disease: Secondary | ICD-10-CM | POA: Insufficient documentation

## 2019-01-26 DIAGNOSIS — R000 Tachycardia, unspecified: Secondary | ICD-10-CM | POA: Diagnosis not present

## 2019-01-26 DIAGNOSIS — I499 Cardiac arrhythmia, unspecified: Secondary | ICD-10-CM | POA: Diagnosis not present

## 2019-01-26 DIAGNOSIS — N183 Chronic kidney disease, stage 3 (moderate): Secondary | ICD-10-CM | POA: Diagnosis not present

## 2019-01-26 DIAGNOSIS — Z79899 Other long term (current) drug therapy: Secondary | ICD-10-CM | POA: Insufficient documentation

## 2019-01-26 DIAGNOSIS — J45909 Unspecified asthma, uncomplicated: Secondary | ICD-10-CM | POA: Insufficient documentation

## 2019-01-26 DIAGNOSIS — I440 Atrioventricular block, first degree: Secondary | ICD-10-CM | POA: Diagnosis not present

## 2019-01-26 DIAGNOSIS — R9431 Abnormal electrocardiogram [ECG] [EKG]: Secondary | ICD-10-CM | POA: Diagnosis present

## 2019-01-26 DIAGNOSIS — I4430 Unspecified atrioventricular block: Secondary | ICD-10-CM | POA: Diagnosis not present

## 2019-01-26 LAB — BASIC METABOLIC PANEL
Anion gap: 12 (ref 5–15)
BUN: 28 mg/dL — ABNORMAL HIGH (ref 8–23)
CO2: 22 mmol/L (ref 22–32)
Calcium: 9.8 mg/dL (ref 8.9–10.3)
Chloride: 101 mmol/L (ref 98–111)
Creatinine, Ser: 2.08 mg/dL — ABNORMAL HIGH (ref 0.61–1.24)
GFR calc Af Amer: 35 mL/min — ABNORMAL LOW (ref >60)
GFR calc non Af Amer: 30 mL/min — ABNORMAL LOW (ref >60)
Glucose, Bld: 104 mg/dL — ABNORMAL HIGH (ref 70–99)
Potassium: 4.1 mmol/L (ref 3.5–5.1)
Sodium: 135 mmol/L (ref 135–145)

## 2019-01-26 LAB — CBC WITH DIFFERENTIAL/PLATELET
Abs Immature Granulocytes: 0.02 10*3/uL (ref 0.00–0.07)
Basophils Absolute: 0.1 10*3/uL (ref 0.0–0.1)
Basophils Relative: 1 %
Eosinophils Absolute: 0.4 10*3/uL (ref 0.0–0.5)
Eosinophils Relative: 4 %
HCT: 38 % — ABNORMAL LOW (ref 39.0–52.0)
Hemoglobin: 13.5 g/dL (ref 13.0–17.0)
Immature Granulocytes: 0 %
Lymphocytes Relative: 31 %
Lymphs Abs: 2.7 10*3/uL (ref 0.7–4.0)
MCH: 36.7 pg — ABNORMAL HIGH (ref 26.0–34.0)
MCHC: 35.5 g/dL (ref 30.0–36.0)
MCV: 103.3 fL — ABNORMAL HIGH (ref 80.0–100.0)
Monocytes Absolute: 0.7 10*3/uL (ref 0.1–1.0)
Monocytes Relative: 8 %
Neutro Abs: 4.8 10*3/uL (ref 1.7–7.7)
Neutrophils Relative %: 56 %
Platelets: 165 10*3/uL (ref 150–400)
RBC: 3.68 MIL/uL — ABNORMAL LOW (ref 4.22–5.81)
RDW: 12.2 % (ref 11.5–15.5)
WBC: 8.8 10*3/uL (ref 4.0–10.5)
nRBC: 0 % (ref 0.0–0.2)

## 2019-01-26 NOTE — Discharge Instructions (Addendum)
Follow up with the cardiology clinic. Keep taking all your medications as prescribed. Return to ER if you feel heart racing, have persistent fast heart rate, or have shortness of breath/chest pain/passing out.

## 2019-01-26 NOTE — ED Triage Notes (Signed)
Per [facility], Called by tele nurse because patient's HR was 116 and patient has history of A fib. Patient denies chest pain, cough and wheezing.

## 2019-01-26 NOTE — ED Notes (Signed)
Pt sitting at bedside and denies c/o.

## 2019-01-26 NOTE — ED Notes (Signed)
DC instructions discussed with pt and understanding of instructions and followup verbalized. Home stable vioa wc qwith son.. Unable to sign as in hallway.

## 2019-01-26 NOTE — ED Provider Notes (Signed)
[facility] EMERGENCY DEPARTMENT Provider Note   CSN: [id number] Arrival date & time: 01/26/19  1231     History   Chief Complaint Chief Complaint  Patient presents with  . Abnormal ECG    HPI Donald Moreno is a 78 y.o. male.     78 year old male with past medical history including atrial flutter, cirrhosis status post TIPS, HTN, gout, CKD, asthma who p/w abnormal heart rate.  The patient has a English as a second language teacher who follows his home heart monitor.  He was contacted by them today because his heart rate was over 100 and reportedly irregular.  He states that he feels fine and was just sitting and watching TV when he was called.  He denies any complaints whatsoever.  Specifically, he denies any shortness of breath, cough, chest pain, vomiting, or recent illness.  He is compliant with medication and denies any recent change to medicines.  The history is provided by the patient.    Past Medical History:  Diagnosis Date  . Acute congestive heart failure ([place])   . Arthritis    "all over"  . Asthma   . Blood in stool 04/11/2017  . Chronic kidney disease    "I see a kidney dr @ [facility]" (10/11/2016)  . Chronic lower back pain    "turned a truck over a long time ago" (10/11/2016)  . Dysrhythmia    bradycardia with occassional junctional rhythm  . ETOH abuse   . Gout   . Hypertension   . Inguinal hernia 10/28/2014   S/P inguinal hernia repair with 4L as ascites removed 03/30/17  . Liver cirrhosis ([place]) 2015  . Macrocytosis   . Pneumonia    "long long time ago" (10/11/2016)  . Spontaneous bacterial peritonitis ([place]) 07/01/2015    Patient Active Problem List   Diagnosis Date Noted  . History of anemia due to CKD 02/20/2018  . Secondary hyperparathyroidism of renal origin ([place]) 02/20/2018  . Corn or callus 07/11/2017  . Macrocytic anemia 04/20/2017  . Cirrhosis of liver with ascites ([place])   . Congestive heart failure ([place])   . Hematuria 01/30/2017  .  Alcoholic cirrhosis of liver with ascites ([place])   . Typical atrial flutter ([place])   . CKD (chronic kidney disease) stage 3, GFR 30-59 ml/min (HCC) 05/27/2015  . At risk for decreased bone density 03/04/2015  . Bradycardia 07/08/2013  . Advanced directives, counseling/discussion 01/03/2011  . Asthma 01/29/2010  . Gout 07/13/2006  . HYPERTENSION, BENIGN SYSTEMIC 07/13/2006  . Arthritis 07/13/2006    Past Surgical History:  Procedure Laterality Date  . INGUINAL HERNIA REPAIR Bilateral 03/30/2017   Procedure: OPEN BILATERAL INGUINAL HERNIA REPAIR WITH MESH;  Surgeon: Kinsinger, Arta Bruce, MD;  Location: [facility];  Service: General;  Laterality: Bilateral;  GENERAL COMBINED WITH REGIONAL FOR POST OP PAIN   . INSERTION OF MESH N/A 10/16/2016   Procedure: INSERTION OF MESH;  Surgeon: Kinsinger, Arta Bruce, MD;  Location: [place];  Service: General;  Laterality: N/A;  . INSERTION OF MESH Bilateral 03/30/2017   Procedure: INSERTION OF MESH;  Surgeon: Kieth Brightly Arta Bruce, MD;  [facility];  Service: General;  Laterality: Bilateral;  GENERAL COMBINED WITH REGIONAL FOR POST OP PAIN   . IR PARACENTESIS  08/19/2016  . IR PARACENTESIS  08/26/2016  . IR PARACENTESIS  09/02/2016  . IR PARACENTESIS  09/09/2016  . IR PARACENTESIS  09/16/2016  . IR PARACENTESIS  10/03/2016  . IR PARACENTESIS  10/11/2016  . IR RADIOLOGIST  EVAL & MGMT  09/29/2016  . IR RADIOLOGIST EVAL & MGMT  12/21/2016  . IR RADIOLOGIST EVAL & MGMT  01/23/2018  . IR TIPS  10/11/2016  . RADIOLOGY WITH ANESTHESIA N/A 10/11/2016   Procedure: TIPS;  Surgeon: Arne Cleveland, MD;  Location: [place];  Service: Radiology;  Laterality: N/A;  . UMBILICAL HERNIA REPAIR N/A 10/16/2016   Procedure: HERNIA REPAIR UMBILICAL ADULT;  Surgeon: Kinsinger, Arta Bruce, MD;  Location: [place];  Service: General;  Laterality: N/A;        Home Medications    Prior to Admission medications   Medication Sig Start Date End Date Taking? Authorizing Provider  albuterol  (PROAIR HFA) 108 (90 Base) MCG/ACT inhaler Inhale 2 puffs into the lungs every 4 (four) hours as needed for wheezing. 05/24/18   Shirley, [place], DO  allopurinol (ZYLOPRIM) 100 MG tablet TAKE 1/2 TABLET BY MOUTH DAILY 12/24/18   Shirley, [place], DO  bisoprolol (ZEBETA) 10 MG tablet TAKE 1 TABLET BY MOUTH EVERY DAY 09/03/18   Sherran Needs, NP  Cholecalciferol (VITAMIN D-3) 5000 units TABS Take 5,000 Units daily by mouth.     [provider]  ferrous sulfate 325 (65 FE) MG tablet Take 1 tablet (325 mg total) by mouth 2 (two) times daily with a meal. Patient taking differently: Take 325 mg by mouth every Monday, Wednesday, and Friday.  10/21/14   Ronnie Doss M, DO  furosemide (LASIX) 40 MG tablet TAKE 1 TAB 3 TIMES A DAY FOR WEIGHT >168LB 2 TIMES A DAY FOR WEIGHT 163-168LB ONCE DAILY FOR WEIGHT <163LB 12/24/18   Shirley, [place], DO  lactulose (CHRONULAC) 10 GM/15ML solution TAKE 15 MLS BY MOUTH 2 (TWO) TIMES DAILY AS NEEDED (NEED TO ENSURE 2 SOFT BM IN A DAY.). 07/27/18   Shirley, [place], DO  montelukast (SINGULAIR) 10 MG tablet TAKE 1 TABLET BY MOUTH EVERYDAY AT BEDTIME 07/27/18   Shirley, [place], DO  Multiple Vitamin (MULTIVITAMIN WITH MINERALS) TABS tablet Take 1 tablet by mouth daily. 04/18/17   Shirley, [place], DO  Nutritional Supplements (ENSURE COMPLETE SHAKE) LIQD Take 1 Container by mouth daily. Patient taking differently: Take 1 Container 2 (two) times daily by mouth.  03/04/15   Katheren Shams, DO  spironolactone (ALDACTONE) 25 MG tablet TAKE 1 TABLET BY MOUTH EVERY DAY 01/08/19   Shirley, [place], DO  WIXELA INHUB 500-50 MCG/DOSE AEPB TAKE 1 PUFF BY MOUTH TWICE A DAY 12/21/18   Enid Derry, [place], DO  Fluticasone-Salmeterol [facility] INHUB) 500-50 MCG/DOSE AEPB Inhale 1 puff into the lungs 2 (two) times daily. 10/27/17   Shirley, [place], DO  spironolactone (ALDACTONE) 25 MG tablet TAKE 1 TABLET BY MOUTH EVERY DAY 10/17/18   Shirley, [place], DO    Family History Family History  Problem  Relation Age of Onset  . Other Mother        died when pt was only 67 - unknown cause.  . Other Father        deceased.  . Asthma Sister   . Heart disease Sister        multiple stents.  . Asthma Brother   . Hypertension Brother   . Asthma Son   . Asthma Brother   . Hypertension Brother     Social History Social History   Tobacco Use  . Smoking status: Never Smoker  . Smokeless tobacco: Current User    Types: Snuff, Chew  Substance Use Topics  . Alcohol use: No    Comment: quit drinking "over 2  yr ago" (10/11/2016)  . Drug use: No     Allergies   Patient has no known allergies.   Review of Systems Review of Systems All other systems reviewed and are negative except that which was mentioned in HPI   Physical Exam Updated Vital Signs BP 132/76   Pulse (!) 57   Temp 98.5 F (36.9 C) (Oral)   Resp 16   SpO2 92%   Physical Exam Vitals signs and nursing note reviewed.  Constitutional:      General: He is not in acute distress.    Appearance: He is well-developed.  HENT:     Head: Normocephalic and atraumatic.  Eyes:     Conjunctiva/sclera: Conjunctivae normal.  Neck:     Musculoskeletal: Neck supple.  Cardiovascular:     Rate and Rhythm: Normal rate and regular rhythm.     Heart sounds: Normal heart sounds. No murmur.  Pulmonary:     Effort: Pulmonary effort is normal.     Breath sounds: Normal breath sounds.  Abdominal:     General: Bowel sounds are normal. There is no distension.     Palpations: Abdomen is soft.     Tenderness: There is no abdominal tenderness.  Skin:    General: Skin is warm and dry.  Neurological:     Mental Status: He is alert and oriented to person, place, and time.     Comments: Fluent speech  Psychiatric:        Judgment: Judgment normal.      ED Treatments / Results  Labs (all labs ordered are listed, but only abnormal results are displayed) Labs Reviewed  BASIC METABOLIC PANEL - Abnormal; Notable for the following  components:      Result Value   Glucose, Bld 104 (*)    BUN 28 (*)    Creatinine, Ser 2.08 (*)    GFR calc non Af Amer 30 (*)    GFR calc Af Amer 35 (*)    All other components within normal limits  CBC WITH DIFFERENTIAL/PLATELET - Abnormal; Notable for the following components:   RBC 3.68 (*)    HCT 38.0 (*)    MCV 103.3 (*)    MCH 36.7 (*)    All other components within normal limits    EKG EKG Interpretation  Date/Time:  Saturday January 26 2019 12:36:43 EDT Ventricular Rate:  67 PR Interval:    QRS Duration: 93 QT Interval:  407 QTC Calculation: 430 R Axis:   56 Text Interpretation:  Sinus rhythm Multiple premature complexes, vent & supraven Prolonged PR interval appears to be slow A fib compared to previous Confirmed by Theotis Burrow [phone]) on 01/26/2019 1:24:04 PM   Radiology No results found.  Procedures Procedures (including critical care time)  Medications Ordered in ED Medications - No data to display   Initial Impression / Assessment and Plan / ED Course  I have reviewed the triage vital signs and the nursing notes.  Pertinent labs that were available during my care of the patient were reviewed by me and considered in my medical decision making (see chart for details).       Comfortable and pleasant on exam. EKG HR is 67, appears to be slow A flutter. He denies any complaints whatsoever. Basic labs reassuring, pt has CKD with Cr 2.08. His heart rate has been well controlled and below 100 in ED. I reviewed his most recent cardiology telemedicine visit back in May with Dr. Sallyanne Kuster which describes a well-known  history of atrial flutter.  Because of his history of cirrhosis, they have felt that risk of bleeding outweighs benefits of anticoagulation. He had problem tolerating Amio in past but has been stable on current med regimen. Because he has been well controlled here, I feel he doesn't require medication adjustment for now. I have recommended close cards  f/u in clinic. Return precautions reviewed. He voiced understanding. Reviewed over the phone w/ legal guardian (nephew).   Final Clinical Impressions(s) / ED Diagnoses   Final diagnoses:  Atrial flutter, paroxysmal [facility])    ED Discharge Orders    None       , Wenda Overland, MD 01/26/19 1505

## 2019-01-29 ENCOUNTER — Telehealth (HOSPITAL_COMMUNITY): Payer: Self-pay | Admitting: Nurse Practitioner

## 2019-01-29 NOTE — Telephone Encounter (Signed)
Patient had left VM during lunch that he needed to make ER f/u appt.  Called and left message for patient to call back

## 2019-01-30 ENCOUNTER — Other Ambulatory Visit: Payer: Self-pay | Admitting: Family Medicine

## 2019-01-30 NOTE — Telephone Encounter (Signed)
Patient returned my call.  He is aware of appt 01/31/2019 at 9:30 with Roderic Palau.

## 2019-01-31 ENCOUNTER — Ambulatory Visit (HOSPITAL_COMMUNITY)
Admission: RE | Admit: 2019-01-31 | Discharge: 2019-01-31 | Disposition: A | Discharge status: Home / Self Care | Payer: Medicare Other | Source: Ambulatory Visit | Attending: Nurse Practitioner | Admitting: Nurse Practitioner

## 2019-01-31 ENCOUNTER — Encounter (HOSPITAL_COMMUNITY): Payer: Self-pay | Admitting: Nurse Practitioner

## 2019-01-31 ENCOUNTER — Other Ambulatory Visit: Payer: Self-pay

## 2019-01-31 VITALS — BP 124/88 | HR 56 | Ht 71.0 in | Wt 176.6 lb

## 2019-01-31 DIAGNOSIS — M1990 Unspecified osteoarthritis, unspecified site: Secondary | ICD-10-CM | POA: Diagnosis not present

## 2019-01-31 DIAGNOSIS — J45909 Unspecified asthma, uncomplicated: Secondary | ICD-10-CM | POA: Diagnosis not present

## 2019-01-31 DIAGNOSIS — Z79899 Other long term (current) drug therapy: Secondary | ICD-10-CM | POA: Diagnosis not present

## 2019-01-31 DIAGNOSIS — I129 Hypertensive chronic kidney disease with stage 1 through stage 4 chronic kidney disease, or unspecified chronic kidney disease: Secondary | ICD-10-CM | POA: Diagnosis not present

## 2019-01-31 DIAGNOSIS — M109 Gout, unspecified: Secondary | ICD-10-CM | POA: Insufficient documentation

## 2019-01-31 DIAGNOSIS — I4892 Unspecified atrial flutter: Secondary | ICD-10-CM | POA: Diagnosis not present

## 2019-01-31 DIAGNOSIS — Z8249 Family history of ischemic heart disease and other diseases of the circulatory system: Secondary | ICD-10-CM | POA: Diagnosis not present

## 2019-01-31 DIAGNOSIS — N183 Chronic kidney disease, stage 3 (moderate): Secondary | ICD-10-CM | POA: Insufficient documentation

## 2019-01-31 DIAGNOSIS — Z825 Family history of asthma and other chronic lower respiratory diseases: Secondary | ICD-10-CM | POA: Diagnosis not present

## 2019-01-31 DIAGNOSIS — K7460 Unspecified cirrhosis of liver: Secondary | ICD-10-CM | POA: Diagnosis not present

## 2019-01-31 NOTE — Progress Notes (Signed)
Primary Care Physician: Donald Moreno, [place], DO Referring Physician: [facility] ER f/u Cardiologist: Dr. Weber Cooks Moreno is a 78 y.o. male with a h/o  advanced liver cirrhosis and ah/o priorEtOH abuse(quit for 3-4 years) with portal HTN and ascites, atrial flutter with RVR in the past (not anticoagulated secondary to advanced liver disease), and CKDstage 3,.He was treated withAmiodarone for rate control and developed a Donald bradycardia, therefore this was discontinued and he was placed on Bisoprolol.  He was  seen in the ER 02/2018 with back and abdominal pain which has resolved. He was found to be in atrial flutter and was referred here. Initial EKG showed atrial  flutter at 104 bpm, however on exam today, HR was regular and in the 60's. Pt states that he is not symptomatic with flutter and it "comes and goes". He had f/u with Dr. Loletha Moreno about one month later, HR was elevated and bisoprolol was increased to 5 mg daily.  F/u in afib clinic, 1/9. He is asymptomatic and feels well but has v rate in the at 119 bpm, either accelerated Donald Moreno or atrial flutter. He is planning to go to [place] to visit his kids ia few weeks for one month.  F/u in the afib clinic, f/u ER. He states that he was watching TV after submitting his V/S to [facility] earlier that am..The nurse called and wanted him to go to ER as his HR was around 100 bpm that am. She did not ask him to recheck HR. He went just to please her as he felt fine.Marland KitchenHe was checked out in the ER and was in a slow atrial flutter, his usual arrhythmia. Pt states that his pulse ox may have read erroneously as he had very long finger nails and may not have read properly. He has since cut his fingernail for his Pulse Ox finger. Ekg shows atrial flutter at 56 bpm today and he continues to feel great.  Today, he denies symptoms of palpitations, chest pain, shortness of breath, orthopnea, PND, lower extremity edema, dizziness, presyncope,  syncope, or neurologic sequela. The patient is tolerating medications without difficulties and is otherwise without complaint today.   Past Medical History:  Diagnosis Date   Acute congestive heart failure ([place])    Arthritis    "all over"   Asthma    Blood in stool 04/11/2017   Chronic kidney disease    "I see a kidney dr @ [facility]" (10/11/2016)   Chronic lower back pain    "turned a truck over a long time ago" (10/11/2016)   Dysrhythmia    bradycardia with occassional Donald Moreno   ETOH abuse    Gout    Hypertension    Inguinal hernia 10/28/2014   S/P inguinal hernia repair with 4L as ascites removed 03/30/17   Liver cirrhosis ([place]) 2015   Macrocytosis    Pneumonia    "long long time ago" (10/11/2016)   Spontaneous bacterial peritonitis ([place]) 07/01/2015   Past Surgical History:  Procedure Laterality Date   INGUINAL HERNIA REPAIR Bilateral 03/30/2017   Procedure: OPEN BILATERAL INGUINAL HERNIA REPAIR WITH MESH;  Surgeon: Moreno, Donald Bruce, MD;  [facility];  Service: General;  Laterality: Bilateral;  GENERAL COMBINED WITH REGIONAL FOR POST OP PAIN    INSERTION OF MESH N/A 10/16/2016   Procedure: INSERTION OF MESH;  Surgeon: Donald Brightly Donald Bruce, MD;  Location: [place];  Service: General;  Laterality: N/A;   INSERTION OF MESH Bilateral 03/30/2017  Procedure: INSERTION OF MESH;  Surgeon: Moreno, Donald Bruce, MD;  [facility];  Service: General;  Laterality: Bilateral;  GENERAL COMBINED WITH REGIONAL FOR POST OP PAIN    IR PARACENTESIS  08/19/2016   IR PARACENTESIS  08/26/2016   IR PARACENTESIS  09/02/2016   IR PARACENTESIS  09/09/2016   IR PARACENTESIS  09/16/2016   IR PARACENTESIS  10/03/2016   IR PARACENTESIS  10/11/2016   IR RADIOLOGIST EVAL & MGMT  09/29/2016   IR RADIOLOGIST EVAL & MGMT  12/21/2016   IR RADIOLOGIST EVAL & MGMT  01/23/2018   IR TIPS  10/11/2016   RADIOLOGY WITH ANESTHESIA N/A 10/11/2016   Procedure: TIPS;  Surgeon:  Donald Cleveland, MD;  Location: [place];  Service: Radiology;  Laterality: N/A;   UMBILICAL HERNIA REPAIR N/A 10/16/2016   Procedure: HERNIA REPAIR UMBILICAL ADULT;  Surgeon: Moreno, Donald Bruce, MD;  Location: [place];  Service: General;  Laterality: N/A;    Current Outpatient Medications  Medication Sig Dispense Refill   albuterol (PROAIR HFA) 108 (90 Base) MCG/ACT inhaler Inhale 2 puffs into the lungs every 4 (four) hours as needed for wheezing. 1 Inhaler 2   allopurinol (ZYLOPRIM) 100 MG tablet TAKE 1/2 TABLET BY MOUTH DAILY 90 tablet 0   bisoprolol (ZEBETA) 10 MG tablet TAKE 1 TABLET BY MOUTH EVERY DAY 90 tablet 1   Cholecalciferol (VITAMIN D-3) 5000 units TABS Take 5,000 Units daily by mouth.      ferrous sulfate 325 (65 FE) MG tablet Take 1 tablet (325 mg total) by mouth 2 (two) times daily with a meal. (Patient taking differently: Take 325 mg by mouth every Monday, Wednesday, and Friday. ) 60 tablet 3   furosemide (LASIX) 40 MG tablet TAKE 1 TAB 3 TIMES A DAY FOR WEIGHT >168LB 2 TIMES A DAY FOR WEIGHT 163-168LB ONCE DAILY FOR WEIGHT <163LB 270 tablet 1   lactulose (CHRONULAC) 10 GM/15ML solution TAKE 15 MLS BY MOUTH 2 (TWO) TIMES DAILY AS NEEDED (NEED TO ENSURE 2 SOFT BM IN A DAY.). 5676 mL 1   montelukast (SINGULAIR) 10 MG tablet TAKE 1 TABLET BY MOUTH EVERYDAY AT BEDTIME 90 tablet 1   Multiple Vitamin (MULTIVITAMIN WITH MINERALS) TABS tablet Take 1 tablet by mouth daily. 30 tablet 0   Nutritional Supplements (ENSURE COMPLETE SHAKE) LIQD Take 1 Container by mouth daily. (Patient taking differently: Take 1 Container 2 (two) times daily by mouth. ) 237 mL 11   spironolactone (ALDACTONE) 25 MG tablet TAKE 1 TABLET BY MOUTH EVERY DAY 90 tablet 0   WIXELA INHUB 500-50 MCG/DOSE AEPB TAKE 1 PUFF BY MOUTH TWICE A DAY 180 each 1   No current facility-administered medications for this encounter.     No Known Allergies  Social History   Socioeconomic History   Marital status:  Divorced    Spouse name: Not on file   Number of children: 5   Years of education: 10   Highest education level: Not on file  Occupational History   Occupation: Retired- truck Solicitor strain: Not on file   Food insecurity    Worry: Not on file    Inability: Not on file   Transportation needs    Medical: Not on file    Non-medical: Not on file  Tobacco Use   Smoking status: Never Smoker   Smokeless tobacco: Current User    Types: Snuff, Chew  Substance and Sexual Activity   Alcohol use: No  Comment: quit drinking "over 2 yr ago" (10/11/2016)   Drug use: No   Sexual activity: Not Currently  Lifestyle   Physical activity    Days per week: Not on file    Minutes per session: Not on file   Stress: Not on file  Relationships   Social connections    Talks on phone: Not on file    Gets together: Not on file    Attends religious service: Not on file    Active member of club or organization: Not on file    Attends meetings of clubs or organizations: Not on file    Relationship status: Not on file   Intimate partner violence    Fear of current or ex partner: Not on file    Emotionally abused: Not on file    Physically abused: Not on file    Forced sexual activity: Not on file  Other Topics Concern   Not on file  Social History Narrative   Lives with nephew and niece in [place] home.     Tobacco: snuff all day (can last two days. Never smoked.       Hobbies: cooking, sleeping, going to store   Pets:  Dog, Bean    Family History  Problem Relation Age of Onset   Other Mother        died when pt was only 82 - unknown cause.   Other Father        deceased.   Asthma Sister    Heart disease Sister        multiple stents.   Asthma Brother    Hypertension Brother    Asthma Son    Asthma Brother    Hypertension Brother     ROS- All systems are reviewed and negative except as per the HPI above  Physical  Exam: Vitals:   01/31/19 0944  BP: 124/88  Pulse: (!) 56  Weight: 80.1 kg  Height: 5\' 11"  (1.803 m)   Wt Readings from Last 3 Encounters:  01/31/19 80.1 kg  09/25/18 75.4 kg  07/27/18 78.7 kg    Labs: Lab Results  Component Value Date   NA 135 01/26/2019   K 4.1 01/26/2019   CL 101 01/26/2019   CO2 22 01/26/2019   GLUCOSE 104 (H) 01/26/2019   BUN 28 (H) 01/26/2019   CREATININE 2.08 (H) 01/26/2019   CALCIUM 9.8 01/26/2019   PHOS 3.7 04/13/2017   MG 2.2 04/13/2017   Lab Results  Component Value Date   INR 1.28 02/19/2018   Lab Results  Component Value Date   CHOL 141 11/11/2011   HDL 71 11/11/2011   LDLCALC 44 11/11/2011   TRIG 128 11/11/2011     GEN- The patient is well appearing, alert and oriented x 3 today.   Head- normocephalic, atraumatic Eyes-  Sclera clear, conjunctiva pink Ears- hearing intact Oropharynx- clear Neck- supple, no JVP Lymph- no cervical lymphadenopathy Lungs- Clear to ausculation bilaterally, normal work of breathing Heart- Regular rate and Moreno, no murmurs, rubs or gallops, PMI not laterally displaced GI- soft, NT, ND, + BS Extremities- no clubbing, cyanosis, or edema MS- no significant deformity or atrophy Skin- no rash or lesion Psych- euthymic mood, full affect Neuro- strength and sensation are intact  EKG- Aflutter at 56 bpm, qrs int 98 ms, qtc 462 ms     Assessment and Plan: 1. Atrial flutter  Longstanding and intermittent , asymptomatic Continue bisoprolol to 10 mg daily Not anticoagulated  secondary to  advanced liver disease   2. HTN Stable   F/u with Dr. Loletha Moreno afib clinic as needed  Donald Moreno. Donald Moreno, [facility] [address] [place], [place] [address] [phone]

## 2019-02-01 ENCOUNTER — Other Ambulatory Visit: Payer: Self-pay | Admitting: *Deleted

## 2019-02-07 ENCOUNTER — Other Ambulatory Visit: Payer: Self-pay | Admitting: Interventional Radiology

## 2019-02-07 DIAGNOSIS — Z95828 Presence of other vascular implants and grafts: Secondary | ICD-10-CM

## 2019-02-19 ENCOUNTER — Encounter: Payer: Self-pay | Admitting: *Deleted

## 2019-02-19 ENCOUNTER — Ambulatory Visit
Admission: RE | Admit: 2019-02-19 | Discharge: 2019-02-19 | Disposition: A | Discharge status: Home / Self Care | Payer: Medicare Other | Source: Ambulatory Visit | Attending: Interventional Radiology | Admitting: Interventional Radiology

## 2019-02-19 DIAGNOSIS — Z95828 Presence of other vascular implants and grafts: Secondary | ICD-10-CM

## 2019-02-19 DIAGNOSIS — R188 Other ascites: Secondary | ICD-10-CM | POA: Diagnosis not present

## 2019-02-19 HISTORY — PX: IR RADIOLOGIST EVAL & MGMT: IMG5224

## 2019-02-19 NOTE — Progress Notes (Signed)
Patient ID: Donald Moreno, male   DOB: 10-24-1940, 78 y.o.   MRN: [id number]       Chief Complaint: Patient was seen in consultation today for follow-up TIPS  at the request of Nicholous Girgenti  Referring Physician(s):  Arta Silence MD  History of Present Illness: Donald Moreno is a 78 y.o. male  with a long history of cirrhosis and portal venous hypertension.  He had TIPS creation 10/01/2018 because of recurrent large volume abdominal ascites requiring multiple ultrasound-guided paracentesis procedures.  He has had no need for paracentesis since his previous visit 1 year ago.  He has no abdominal distention.  He maintains his weight using Lasix for fluid balance.  His appetite remains good.  His energy is good.  He continues on lactulose without significant GI side effect or any evidence of hepatic encephalopathy.  Past Medical History:  Diagnosis Date  . Acute congestive heart failure ([place])   . Arthritis    "all over"  . Asthma   . Blood in stool 04/11/2017  . Chronic kidney disease    "I see a kidney dr @ [facility]" (10/11/2016)  . Chronic lower back pain    "turned a truck over a long time ago" (10/11/2016)  . Dysrhythmia    bradycardia with occassional junctional rhythm  . ETOH abuse   . Gout   . Hypertension   . Inguinal hernia 10/28/2014   S/P inguinal hernia repair with 4L as ascites removed 03/30/17  . Liver cirrhosis ([place]) 2015  . Macrocytosis   . Pneumonia    "long long time ago" (10/11/2016)  . Spontaneous bacterial peritonitis ([place]) 07/01/2015    Past Surgical History:  Procedure Laterality Date  . INGUINAL HERNIA REPAIR Bilateral 03/30/2017   Procedure: OPEN BILATERAL INGUINAL HERNIA REPAIR WITH MESH;  Surgeon: Kinsinger, Arta Bruce, MD;  Location: [facility];  Service: General;  Laterality: Bilateral;  GENERAL COMBINED WITH REGIONAL FOR POST OP PAIN   . INSERTION OF MESH N/A 10/16/2016   Procedure: INSERTION OF MESH;  Surgeon: Kinsinger, Arta Bruce, MD;  Location:  [facility];  Service: General;  Laterality: N/A;  . INSERTION OF MESH Bilateral 03/30/2017   Procedure: INSERTION OF MESH;  Surgeon: Kieth Brightly Arta Bruce, MD;  Location: [facility];  Service: General;  Laterality: Bilateral;  GENERAL COMBINED WITH REGIONAL FOR POST OP PAIN   . IR PARACENTESIS  08/19/2016  . IR PARACENTESIS  08/26/2016  . IR PARACENTESIS  09/02/2016  . IR PARACENTESIS  09/09/2016  . IR PARACENTESIS  09/16/2016  . IR PARACENTESIS  10/03/2016  . IR PARACENTESIS  10/11/2016  . IR RADIOLOGIST EVAL & MGMT  09/29/2016  . IR RADIOLOGIST EVAL & MGMT  12/21/2016  . IR RADIOLOGIST EVAL & MGMT  01/23/2018  . IR TIPS  10/11/2016  . RADIOLOGY WITH ANESTHESIA N/A 10/11/2016   Procedure: TIPS;  Surgeon: Arne Cleveland, MD;  Location: [facility];  Service: Radiology;  Laterality: N/A;  . UMBILICAL HERNIA REPAIR N/A 10/16/2016   Procedure: HERNIA REPAIR UMBILICAL ADULT;  Surgeon: Kinsinger, Arta Bruce, MD;  Location: [facility];  Service: General;  Laterality: N/A;    Allergies: Patient has no known allergies.  Medications: Prior to Admission medications   Medication Sig Start Date End Date Taking? Authorizing Provider  albuterol (PROAIR HFA) 108 (90 Base) MCG/ACT inhaler Inhale 2 puffs into the lungs every 4 (four) hours as needed for wheezing. 05/24/18   Shirley, [place], DO  allopurinol (ZYLOPRIM) 100 MG tablet TAKE 1/2 TABLET BY MOUTH DAILY  12/24/18   Shirley, [place], DO  bisoprolol (ZEBETA) 10 MG tablet TAKE 1 TABLET BY MOUTH EVERY DAY 09/03/18   Sherran Needs, NP  Cholecalciferol (VITAMIN D-3) 5000 units TABS Take 5,000 Units daily by mouth.     [provider]  ferrous sulfate 325 (65 FE) MG tablet Take 1 tablet (325 mg total) by mouth 2 (two) times daily with a meal. Patient taking differently: Take 325 mg by mouth every Monday, Wednesday, and Friday.  10/21/14   Ronnie Doss M, DO  furosemide (LASIX) 40 MG tablet TAKE 1 TAB 3 TIMES A DAY FOR WEIGHT >168LB 2 TIMES A DAY FOR WEIGHT 163-168LB ONCE DAILY  FOR WEIGHT <163LB 12/24/18   Shirley, [place], DO  lactulose (CHRONULAC) 10 GM/15ML solution TAKE 15 MLS BY MOUTH 2 (TWO) TIMES DAILY AS NEEDED (NEED TO ENSURE 2 SOFT BM IN A DAY.). 07/27/18   Shirley, [place], DO  montelukast (SINGULAIR) 10 MG tablet TAKE 1 TABLET BY MOUTH EVERYDAY AT BEDTIME 01/30/19   Shirley, [place], DO  Multiple Vitamin (MULTIVITAMIN WITH MINERALS) TABS tablet Take 1 tablet by mouth daily. 04/18/17   Shirley, [place], DO  Nutritional Supplements (ENSURE COMPLETE SHAKE) LIQD Take 1 Container by mouth daily. Patient taking differently: Take 1 Container 2 (two) times daily by mouth.  03/04/15   Katheren Shams, DO  spironolactone (ALDACTONE) 25 MG tablet TAKE 1 TABLET BY MOUTH EVERY DAY 01/08/19   Shirley, [place], DO  WIXELA INHUB 500-50 MCG/DOSE AEPB TAKE 1 PUFF BY MOUTH TWICE A DAY 12/21/18   Enid Derry, [place], DO  Fluticasone-Salmeterol [facility] INHUB) 500-50 MCG/DOSE AEPB Inhale 1 puff into the lungs 2 (two) times daily. 10/27/17   Shirley, [place], DO  montelukast (SINGULAIR) 10 MG tablet TAKE 1 TABLET BY MOUTH EVERYDAY AT BEDTIME 07/27/18   Enid Derry, [place], DO  spironolactone (ALDACTONE) 25 MG tablet TAKE 1 TABLET BY MOUTH EVERY DAY 10/17/18   Shirley, [place], DO     Family History  Problem Relation Age of Onset  . Other Mother        died when pt was only 58 - unknown cause.  . Other Father        deceased.  . Asthma Sister   . Heart disease Sister        multiple stents.  . Asthma Brother   . Hypertension Brother   . Asthma Son   . Asthma Brother   . Hypertension Brother     Social History   Socioeconomic History  . Marital status: Divorced    Spouse name: Not on file  . Number of children: 5  . Years of education: 10  . Highest education level: Not on file  Occupational History  . 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 Financial resource strain: Not on file  . Food insecurity    Worry: Not on file    Inability: Not on file  . Transportation needs     Medical: Not on file    Non-medical: Not on file  Tobacco Use  . Smoking status: Never Smoker  . Smokeless tobacco: Current User    Types: Snuff, Chew  Substance and Sexual Activity  . Alcohol use: No    Comment: quit drinking "over 2 yr ago" (10/11/2016)  . Drug use: No  . Sexual activity: Not Currently  Lifestyle  . Physical activity    Days per week: Not on file    Minutes per session: Not on file  . Stress: Not on  file  Relationships  . Social Herbalist on phone: Not on file    Gets together: Not on file    Attends religious service: Not on file    Active member of club or organization: Not on file    Attends meetings of clubs or organizations: Not on file    Relationship status: Not on file  Other Topics Concern  . Not on file  Social History Narrative   Lives with nephew and niece in [place] home.     Tobacco: snuff all day (can last two days. Never smoked.       Hobbies: cooking, sleeping, going to store   Pets:  Dog, Bean    ECOG Status: 0 - Asymptomatic  Physical Exam Vital Signs: BP 134/67 (BP Location: Right Arm)   Pulse 67   Temp 97.8 F (36.6 C)   SpO2 99%   Constitutional: Oriented to person, place, and time. Well-developed and well-nourished. No distress.   HENT:  Head: Normocephalic and atraumatic.  Eyes: Conjunctivae and EOM are normal. Right eye exhibits no discharge. Left eye exhibits no discharge. No scleral icterus.  Neck: No JVD present.  Pulmonary/Chest: Effort normal. No stridor. No respiratory distress.  Abdomen: soft, non distended Neurological:  alert and oriented to person, place, and time.  Skin: Skin is warm and dry.  not diaphoretic.  Psychiatric:   normal mood and affect.   behavior is normal. Judgment and thought content normal.   Mallampati Score: Review of Systems Review of Systems: A 12 point ROS discussed and pertinent positives are indicated in the HPI above.  All other systems are negative.        Imaging: CLINICAL DATA: TIPS creation 10/11/2016 for recurrent large volume abdominal ascites, currently asymptomatic  EXAM: DUPLEX ULTRASOUND OF LIVER AND TIPS SHUNT  TECHNIQUE: Color and duplex Doppler ultrasound was performed to evaluate the hepatic in-flow and out-flow vessels.  COMPARISON: 10/19/2018 and previous  FINDINGS: TIPS Shunt Velocities (all antegrade)  Proximal: 44 cm/sec  Mid: 75  Distal: 52 cm/sec  Portal Vein Velocities (all hepatopetal)  Main: 23 cm/sec  Right: 44 cm/sec  Left: 33 cm/sec  Hepatic Vein Velocities (all hepatofugal)  Right: 52 cm/sec  Middle: 27 cm/sec  Left: 35 cm/sec  Hepatic Artery Velocity: 107 cm/sec  Splenic Vein Velocity: 15 cm/sec  Spleen 5.4 x 9.9 x 4 cm  SMV: 30 cm/sec  Varices: None identified  Ascites: None  Other findings: Coarse hepatic parenchymal echotexture. 1.6 cm cyst near the porta hepatis.  IMPRESSION: 1. Continued patency of TIPS without complicating features. 2. Continued antegrade flow in the portal venous system.   Electronically Signed By: Lucrezia Europe M.D. On: 02/19/2019 10:28   Labs:  CBC: Recent Labs    07/27/18 0929 01/26/19 1254  WBC 7.3 8.8  HGB 11.2* 13.5  HCT 31.0* 38.0*  PLT 171 165    COAGS: No results for input(s): INR, APTT in the last 8760 hours.  BMP: Recent Labs    07/27/18 0929 01/26/19 1254  NA 140 135  K 4.2 4.1  CL 106 101  CO2 20 22  GLUCOSE 93 104*  BUN 23 28*  CALCIUM 9.9 9.8  CREATININE 2.00* 2.08*  GFRNONAA 31* 30*  GFRAA 36* 35*    LIVER FUNCTION TESTS: Recent Labs    07/27/18 0929  BILITOT 1.4*  AST 23  ALT 14  ALKPHOS 85  PROT 7.3  ALBUMIN 3.6*    TUMOR MARKERS: No results for input(s):  AFPTM, CEA, CA199, CHROMGRNA in the last 8760 hours.  Assessment and Plan:  My impression is that Donald Moreno is doing great post TIPS creation 2-1/2 years ago.  This has obviated his need for recurrent paracentesis.  He is not having any  problems with hepatic encephalopathy.  The TIPS looks great on ultrasound.  I would anticipate long-term success with regards to the TIPS.  He will need continued surveillance because of his underlying cirrhosis of course.  We reviewed together today's ultrasound findings, and the expected durable function of the TIPS.  We discussed the importance of the Lasix and lactulose.  He has a good attitude about life and looks forward to every day.  He knows to call with any questions or problems.  We will plan to see him back in about a year for his usual annual surveillance ultrasound.  Thank you for this interesting consult.  I greatly enjoyed seeing  Donald Moreno and look forward to participating in their care.  A copy of this report was sent to the requesting provider on this date.  Electronically Signed: Rickard Rhymes 02/19/2019, 10:10 AM   I spent a total of    25 Minutes in face to face in clinical consultation, greater than 50% of which was counseling/coordinating care for TIPS surveillance.

## 2019-02-23 ENCOUNTER — Other Ambulatory Visit (HOSPITAL_COMMUNITY): Payer: Self-pay | Admitting: Nurse Practitioner

## 2019-03-06 ENCOUNTER — Ambulatory Visit (HOSPITAL_COMMUNITY): Payer: Medicare Other | Admitting: Nurse Practitioner

## 2019-03-17 ENCOUNTER — Other Ambulatory Visit: Payer: Self-pay | Admitting: Family Medicine

## 2019-04-05 ENCOUNTER — Ambulatory Visit: Payer: Medicare Other | Admitting: Podiatry

## 2019-04-05 ENCOUNTER — Other Ambulatory Visit: Payer: Self-pay

## 2019-04-05 DIAGNOSIS — B351 Tinea unguium: Secondary | ICD-10-CM | POA: Diagnosis not present

## 2019-04-05 DIAGNOSIS — L84 Corns and callosities: Secondary | ICD-10-CM

## 2019-04-09 ENCOUNTER — Telehealth: Payer: Self-pay | Admitting: Cardiovascular Disease

## 2019-04-09 NOTE — Telephone Encounter (Signed)
STAT if HR is under 50 or over 120 (normal HR is 60-100 beats per minute)  1) What is your heart rate? 116  2) Do you have a log of your heart rate readings (document readings)? 04/07/19 - 04/09/19 113, 136, 110, 118, 116   3) Do you have any other symptoms? No  Dede from [employer] wanted to inform Dr. Sallyanne Kuster regarding patients elevated HR and  stated to please call back patient at [phone] regarding HR.

## 2019-04-09 NOTE — Telephone Encounter (Signed)
It is very likely that he will have occasional breakthrough episodes of atrial flutter. Please make sure that he is taking his bisoprolol and remind him to avoid skipping it or running out of it due to the risk of "rebound" arrhythmia.  He has tolerated the flutter well in the past. Please call us back if the heart rate remains >100 without interruption for 3 consecutive days.

## 2019-04-09 NOTE — Telephone Encounter (Signed)
Called patient- he advised that his nurse does call and worries about him- but his HR has been more elevated recently. He denies any symptoms- no chest pain, dizziness, SOB, he states he feels fine, no change in his medications. His BP has been okay- only thing is the HR is elevated. I advised I would notify MD, but to call us if he began to have symptoms- patient verbalized understanding, he is aware to take periods of rest.

## 2019-04-10 NOTE — Telephone Encounter (Signed)
Left a message for the patient to call back.  

## 2019-04-10 NOTE — Telephone Encounter (Signed)
The patient has been made aware and verbalized his understanding. He will call back if anything further is needed and if it heart rate stays above 100 for three consecutive days.

## 2019-04-23 NOTE — Progress Notes (Signed)
Subjective:  Patient ID: Donald Moreno, male    DOB: 06-21-1940,  MRN: [id number]  Chief Complaint  Patient presents with  . Callouses    bilateral , R great toe - plantar toe and medial side . L great toe - medial side . *nail trim also being requested     78 y.o. male presents with the above complaint.  Reports elongated nails to both feet. Complains of calluses to his feet.  Review of Systems: Negative except as noted in the HPI. Denies N/V/F/Ch.  Past Medical History:  Diagnosis Date  . Acute congestive heart failure ([place])   . Arthritis    "all over"  . Asthma   . Blood in stool 04/11/2017  . Chronic kidney disease    "I see a kidney dr @ [facility]" (10/11/2016)  . Chronic lower back pain    "turned a truck over a long time ago" (10/11/2016)  . Dysrhythmia    bradycardia with occassional junctional rhythm  . ETOH abuse   . Gout   . Hypertension   . Inguinal hernia 10/28/2014   S/P inguinal hernia repair with 4L as ascites removed 03/30/17  . Liver cirrhosis ([place]) 2015  . Macrocytosis   . Pneumonia    "long long time ago" (10/11/2016)  . Spontaneous bacterial peritonitis ([place]) 07/01/2015    Current Outpatient Medications:  .  albuterol (PROAIR HFA) 108 (90 Base) MCG/ACT inhaler, Inhale 2 puffs into the lungs every 4 (four) hours as needed for wheezing., Disp: 1 Inhaler, Rfl: 2 .  allopurinol (ZYLOPRIM) 100 MG tablet, TAKE 1/2 TABLET BY MOUTH EVERY DAY, Disp: 90 tablet, Rfl: 0 .  bisoprolol (ZEBETA) 10 MG tablet, TAKE 1 TABLET BY MOUTH EVERY DAY, Disp: 90 tablet, Rfl: 3 .  Cholecalciferol (VITAMIN D-3) 5000 units TABS, Take 5,000 Units daily by mouth. , Disp: , Rfl:  .  ferrous sulfate 325 (65 FE) MG tablet, Take 1 tablet (325 mg total) by mouth 2 (two) times daily with a meal. (Patient taking differently: Take 325 mg by mouth every Monday, Wednesday, and Friday. ), Disp: 60 tablet, Rfl: 3 .  furosemide (LASIX) 40 MG tablet, TAKE 1 TAB 3 TIMES A DAY FOR WEIGHT >168LB 2  TIMES A DAY FOR WEIGHT 163-168LB ONCE DAILY FOR WEIGHT <163LB, Disp: 270 tablet, Rfl: 1 .  lactulose (CHRONULAC) 10 GM/15ML solution, TAKE 15 MLS BY MOUTH 2 (TWO) TIMES DAILY AS NEEDED (NEED TO ENSURE 2 SOFT BM IN A DAY.)., Disp: 5676 mL, Rfl: 1 .  montelukast (SINGULAIR) 10 MG tablet, TAKE 1 TABLET BY MOUTH EVERYDAY AT BEDTIME, Disp: 90 tablet, Rfl: 1 .  Multiple Vitamin (MULTIVITAMIN WITH MINERALS) TABS tablet, Take 1 tablet by mouth daily., Disp: 30 tablet, Rfl: 0 .  Nutritional Supplements (ENSURE COMPLETE SHAKE) LIQD, Take 1 Container by mouth daily. (Patient taking differently: Take 1 Container 2 (two) times daily by mouth. ), Disp: 237 mL, Rfl: 11 .  spironolactone (ALDACTONE) 25 MG tablet, TAKE 1 TABLET BY MOUTH EVERY DAY, Disp: 90 tablet, Rfl: 0 .  WIXELA INHUB 500-50 MCG/DOSE AEPB, TAKE 1 PUFF BY MOUTH TWICE A DAY, Disp: 180 each, Rfl: 1  Social History   Tobacco Use  Smoking Status Never Smoker  Smokeless Tobacco Current User  . Types: Snuff, Chew    No Known Allergies Objective:  There were no vitals filed for this visit. There is no height or weight on file to calculate BMI. Constitutional Well developed. Well nourished.  Vascular Dorsalis pedis pulses  palpable bilaterally. Posterior tibial pulses palpable bilaterally. Capillary refill normal to all digits.  No cyanosis or clubbing noted. Pedal hair growth normal.  Neurologic Normal speech. Oriented to person, place, and time. Epicritic sensation to light touch grossly present bilaterally.  Dermatologic Nails elongated dystrophic pain to palpation No open wounds. No skin lesions.  Orthopedic: Normal joint ROM without pain or crepitus bilaterally. No visible deformities. No bony tenderness.   Radiographs: None Assessment:   1. Onychomycosis   2. Callus    Plan:  Patient was evaluated and treated and all questions answered.  Onychomycosis  -Nails debridement as below per patient request -Educated on  self-care  Procedure: Nail Debridement Rationale: Pain Type of Debridement: manual, sharp debridement. Instrumentation: Nail nipper, rotary burr. Number of Nails: 10  Calluses  -Debrided x2  -Procedure: Paring of Lesion Rationale: painful hyperkeratotic lesion Type of Debridement: manual, sharp debridement. Instrumentation: 312 blade Number of Lesions: 2   Return if symptoms worsen or fail to improve.

## 2019-04-24 DIAGNOSIS — Z720 Tobacco use: Secondary | ICD-10-CM | POA: Diagnosis not present

## 2019-04-24 DIAGNOSIS — D631 Anemia in chronic kidney disease: Secondary | ICD-10-CM | POA: Diagnosis not present

## 2019-04-24 DIAGNOSIS — N2581 Secondary hyperparathyroidism of renal origin: Secondary | ICD-10-CM | POA: Diagnosis not present

## 2019-04-24 DIAGNOSIS — N183 Chronic kidney disease, stage 3 unspecified: Secondary | ICD-10-CM | POA: Diagnosis not present

## 2019-04-24 DIAGNOSIS — I129 Hypertensive chronic kidney disease with stage 1 through stage 4 chronic kidney disease, or unspecified chronic kidney disease: Secondary | ICD-10-CM | POA: Diagnosis not present

## 2019-05-06 ENCOUNTER — Other Ambulatory Visit: Payer: Self-pay | Admitting: Family Medicine

## 2019-05-07 ENCOUNTER — Other Ambulatory Visit: Payer: Self-pay | Admitting: *Deleted

## 2019-05-07 DIAGNOSIS — I509 Heart failure, unspecified: Secondary | ICD-10-CM

## 2019-05-07 MED ORDER — SPIRONOLACTONE 25 MG PO TABS: 25.0000 mg | ORAL_TABLET | Freq: Every day | ORAL | 0 refills | Status: DC

## 2019-06-24 ENCOUNTER — Other Ambulatory Visit: Payer: Self-pay | Admitting: Family Medicine

## 2019-06-30 ENCOUNTER — Ambulatory Visit: Payer: Medicare Other | Attending: Internal Medicine

## 2019-06-30 DIAGNOSIS — Z23 Encounter for immunization: Secondary | ICD-10-CM

## 2019-06-30 NOTE — Progress Notes (Signed)
   Covid-19 Vaccination Clinic  Name:  Donald Moreno    MRN: [id number] DOB: 09/12/1940  06/30/2019  [name] was observed post Covid-19 immunization for 15 minutes without incidence. He was provided with Vaccine Information Sheet and instruction to access the V-Safe system.   [name] was instructed to call 911 with any severe reactions post vaccine: Marland Kitchen Difficulty breathing  . Swelling of your face and throat  . A fast heartbeat  . A bad rash all over your body  . Dizziness and weakness    Immunizations Administered    Name Date Dose VIS Date Route   Pfizer COVID-19 Vaccine 06/30/2019  9:34 AM 0.3 mL 04/26/2019 Intramuscular   Manufacturer: [place]   Lot: [id number]   [place]: [id number]

## 2019-07-18 ENCOUNTER — Emergency Department (HOSPITAL_COMMUNITY)
Admission: EM | Admit: 2019-07-18 | Discharge: 2019-07-18 | Disposition: A | Discharge status: Home / Self Care | Payer: Medicare Other | Attending: Emergency Medicine | Admitting: Emergency Medicine

## 2019-07-18 ENCOUNTER — Encounter (HOSPITAL_COMMUNITY): Payer: Self-pay

## 2019-07-18 ENCOUNTER — Ambulatory Visit (HOSPITAL_COMMUNITY)
Admission: EM | Admit: 2019-07-18 | Discharge: 2019-07-18 | Disposition: A | Discharge status: Hospice | Payer: Medicare Other

## 2019-07-18 ENCOUNTER — Other Ambulatory Visit: Payer: Self-pay

## 2019-07-18 ENCOUNTER — Emergency Department (HOSPITAL_COMMUNITY): Payer: Medicare Other

## 2019-07-18 DIAGNOSIS — I129 Hypertensive chronic kidney disease with stage 1 through stage 4 chronic kidney disease, or unspecified chronic kidney disease: Secondary | ICD-10-CM | POA: Diagnosis not present

## 2019-07-18 DIAGNOSIS — N183 Chronic kidney disease, stage 3 unspecified: Secondary | ICD-10-CM | POA: Diagnosis not present

## 2019-07-18 DIAGNOSIS — I10 Essential (primary) hypertension: Secondary | ICD-10-CM | POA: Diagnosis not present

## 2019-07-18 DIAGNOSIS — R251 Tremor, unspecified: Secondary | ICD-10-CM | POA: Insufficient documentation

## 2019-07-18 DIAGNOSIS — R519 Headache, unspecified: Secondary | ICD-10-CM | POA: Insufficient documentation

## 2019-07-18 DIAGNOSIS — Z79899 Other long term (current) drug therapy: Secondary | ICD-10-CM | POA: Diagnosis not present

## 2019-07-18 DIAGNOSIS — J45909 Unspecified asthma, uncomplicated: Secondary | ICD-10-CM | POA: Diagnosis not present

## 2019-07-18 DIAGNOSIS — R791 Abnormal coagulation profile: Secondary | ICD-10-CM | POA: Diagnosis not present

## 2019-07-18 LAB — PROTIME-INR
INR: 1.1 (ref 0.8–1.2)
Prothrombin Time: 13.7 seconds (ref 11.4–15.2)

## 2019-07-18 LAB — COMPREHENSIVE METABOLIC PANEL
ALT: 29 U/L (ref 0–44)
AST: 53 U/L — ABNORMAL HIGH (ref 15–41)
Albumin: 3.4 g/dL — ABNORMAL LOW (ref 3.5–5.0)
Alkaline Phosphatase: 59 U/L (ref 38–126)
Anion gap: 11 (ref 5–15)
BUN: 34 mg/dL — ABNORMAL HIGH (ref 8–23)
CO2: 23 mmol/L (ref 22–32)
Calcium: 9.3 mg/dL (ref 8.9–10.3)
Chloride: 101 mmol/L (ref 98–111)
Creatinine, Ser: 2.31 mg/dL — ABNORMAL HIGH (ref 0.61–1.24)
GFR calc Af Amer: 30 mL/min — ABNORMAL LOW (ref >60)
GFR calc non Af Amer: 26 mL/min — ABNORMAL LOW (ref >60)
Glucose, Bld: 94 mg/dL (ref 70–99)
Potassium: 4 mmol/L (ref 3.5–5.1)
Sodium: 135 mmol/L (ref 135–145)
Total Bilirubin: 1.5 mg/dL — ABNORMAL HIGH (ref 0.3–1.2)
Total Protein: 7.9 g/dL (ref 6.5–8.1)

## 2019-07-18 LAB — CBC
HCT: 45.6 % (ref 39.0–52.0)
Hemoglobin: 15.4 g/dL (ref 13.0–17.0)
MCH: 35.3 pg — ABNORMAL HIGH (ref 26.0–34.0)
MCHC: 33.8 g/dL (ref 30.0–36.0)
MCV: 104.6 fL — ABNORMAL HIGH (ref 80.0–100.0)
Platelets: UNDETERMINED 10*3/uL (ref 150–400)
RBC: 4.36 MIL/uL (ref 4.22–5.81)
RDW: 12.2 % (ref 11.5–15.5)
WBC: 5.2 10*3/uL (ref 4.0–10.5)
nRBC: 0 % (ref 0.0–0.2)

## 2019-07-18 LAB — DIFFERENTIAL
Abs Immature Granulocytes: 0.02 10*3/uL (ref 0.00–0.07)
Basophils Absolute: 0 10*3/uL (ref 0.0–0.1)
Basophils Relative: 1 %
Eosinophils Absolute: 0.1 10*3/uL (ref 0.0–0.5)
Eosinophils Relative: 2 %
Immature Granulocytes: 0 %
Lymphocytes Relative: 42 %
Lymphs Abs: 2.2 10*3/uL (ref 0.7–4.0)
Monocytes Absolute: 0.4 10*3/uL (ref 0.1–1.0)
Monocytes Relative: 9 %
Neutro Abs: 2.4 10*3/uL (ref 1.7–7.7)
Neutrophils Relative %: 46 %

## 2019-07-18 LAB — SEDIMENTATION RATE: Sed Rate: 40 mm/hr — ABNORMAL HIGH (ref 0–16)

## 2019-07-18 LAB — AMMONIA: Ammonia: 34 umol/L (ref 9–35)

## 2019-07-18 LAB — APTT: aPTT: 24 seconds (ref 24–36)

## 2019-07-18 NOTE — ED Provider Notes (Signed)
[facility] EMERGENCY DEPARTMENT Provider Note   CSN: [id number] Arrival date & time: 07/18/19  8786     History Chief Complaint  Patient presents with  . Headache  . Tremors    Parminder Cupples is a 79 y.o. male.  HPI Patient presents with headache.  On left side of the head but also goes to the right side.  Reportedly worse with lying down.  Is dull.  No numbness weakness.  However states he is shaky.  States he is shaking arms and legs.  States it is so bad he cannot drink because he cannot pick something up for it.  History of cirrhosis. History of paroxysmal atrial flutter.  Not on anticoagulation due to chronic liver disease.No vision changes.  No abdominal pain.Denies weakness states he is just shaky.    Past Medical History:  Diagnosis Date  . Acute congestive heart failure ([place])   . Arthritis    "all over"  . Asthma   . Blood in stool 04/11/2017  . Chronic kidney disease    "I see a kidney dr @ [facility]" (10/11/2016)  . Chronic lower back pain    "turned a truck over a long time ago" (10/11/2016)  . Dysrhythmia    bradycardia with occassional junctional rhythm  . ETOH abuse   . Gout   . Hypertension   . Inguinal hernia 10/28/2014   S/P inguinal hernia repair with 4L as ascites removed 03/30/17  . Liver cirrhosis ([place]) 2015  . Macrocytosis   . Pneumonia    "long long time ago" (10/11/2016)  . Spontaneous bacterial peritonitis ([place]) 07/01/2015    Patient Active Problem List   Diagnosis Date Noted  . History of anemia due to CKD 02/20/2018  . Secondary hyperparathyroidism of renal origin ([place]) 02/20/2018  . Corn or callus 07/11/2017  . Macrocytic anemia 04/20/2017  . Cirrhosis of liver with ascites ([place])   . Congestive heart failure ([place])   . Hematuria 01/30/2017  . Alcoholic cirrhosis of liver with ascites ([place])   . Typical atrial flutter ([place])   . CKD (chronic kidney disease) stage 3, GFR 30-59 ml/min (H[facility]) 05/27/2015  . At risk for  decreased bone density 03/04/2015  . Bradycardia 07/08/2013  . Advanced directives, counseling/discussion 01/03/2011  . Asthma 01/29/2010  . Gout 07/13/2006  . HYPERTENSION, BENIGN SYSTEMIC 07/13/2006  . Arthritis 07/13/2006    Past Surgical History:  Procedure Laterality Date  . INGUINAL HERNIA REPAIR Bilateral 03/30/2017   Procedure: OPEN BILATERAL INGUINAL HERNIA REPAIR WITH MESH;  Surgeon: Kinsinger, Arta Bruce, MD;  Location: [facility];  Service: General;  Laterality: Bilateral;  GENERAL COMBINED WITH REGIONAL FOR POST OP PAIN   . INSERTION OF MESH N/A 10/16/2016   Procedure: INSERTION OF MESH;  Surgeon: Kinsinger, Arta Bruce, MD;  Location: [place];  Service: General;  Laterality: N/A;  . INSERTION OF MESH Bilateral 03/30/2017   Procedure: INSERTION OF MESH;  Surgeon: Kieth Brightly Arta Bruce, MD;  Location: [facility];  Service: General;  Laterality: Bilateral;  GENERAL COMBINED WITH REGIONAL FOR POST OP PAIN   . IR PARACENTESIS  08/19/2016  . IR PARACENTESIS  08/26/2016  . IR PARACENTESIS  09/02/2016  . IR PARACENTESIS  09/09/2016  . IR PARACENTESIS  09/16/2016  . IR PARACENTESIS  10/03/2016  . IR PARACENTESIS  10/11/2016  . IR RADIOLOGIST EVAL & MGMT  09/29/2016  . IR RADIOLOGIST EVAL & MGMT  12/21/2016  . IR RADIOLOGIST EVAL & MGMT  01/23/2018  .  IR RADIOLOGIST EVAL & MGMT  02/19/2019  . IR TIPS  10/11/2016  . RADIOLOGY WITH ANESTHESIA N/A 10/11/2016   Procedure: TIPS;  Surgeon: Arne Cleveland, MD;  Location: [place];  Service: Radiology;  Laterality: N/A;  . UMBILICAL HERNIA REPAIR N/A 10/16/2016   Procedure: HERNIA REPAIR UMBILICAL ADULT;  Surgeon: Kinsinger, Arta Bruce, MD;  Location: [place];  Service: General;  Laterality: N/A;       Family History  Problem Relation Age of Onset  . Other Mother        died when pt was only 33 - unknown cause.  . Other Father        deceased.  . Asthma Sister   . Heart disease Sister        multiple stents.  . Asthma Brother   . Hypertension Brother   .  Asthma Son   . Asthma Brother   . Hypertension Brother     Social History   Tobacco Use  . Smoking status: Never Smoker  . Smokeless tobacco: Current User    Types: Snuff, Chew  Substance Use Topics  . Alcohol use: No    Comment: quit drinking "over 2 yr ago" (10/11/2016)  . Drug use: No    Home Medications Prior to Admission medications   Medication Sig Start Date End Date Taking? Authorizing Provider  albuterol (PROAIR HFA) 108 (90 Base) MCG/ACT inhaler Inhale 2 puffs into the lungs every 4 (four) hours as needed for wheezing. 05/24/18   Shirley, [place], DO  allopurinol (ZYLOPRIM) 100 MG tablet TAKE 1/2 TABLET BY MOUTH EVERY DAY 03/18/19   Shirley, [place], DO  bisoprolol (ZEBETA) 10 MG tablet TAKE 1 TABLET BY MOUTH EVERY DAY 02/25/19   Sherran Needs, NP  Cholecalciferol (VITAMIN D-3) 5000 units TABS Take 5,000 Units daily by mouth.     [provider]  ferrous sulfate 325 (65 FE) MG tablet Take 1 tablet (325 mg total) by mouth 2 (two) times daily with a meal. Patient taking differently: Take 325 mg by mouth every Monday, Wednesday, and Friday.  10/21/14   Ronnie Doss M, DO  furosemide (LASIX) 40 MG tablet TAKE 1 TAB 3 TIMES A DAY FOR WEIGHT >168LB 2 TIMES A DAY FOR WEIGHT 163-168LB ONCE DAILY FOR WEIGHT <163LB 12/24/18   Shirley, [place], DO  lactulose (CHRONULAC) 10 GM/15ML solution TAKE 15 MLS BY MOUTH 2 (TWO) TIMES DAILY AS NEEDED (NEED TO ENSURE 2 SOFT BM IN A DAY.). 05/06/19   Shirley, [place], DO  montelukast (SINGULAIR) 10 MG tablet TAKE 1 TABLET BY MOUTH EVERYDAY AT BEDTIME 01/30/19   Shirley, [place], DO  Multiple Vitamin (MULTIVITAMIN WITH MINERALS) TABS tablet Take 1 tablet by mouth daily. 04/18/17   Shirley, [place], DO  Nutritional Supplements (ENSURE COMPLETE SHAKE) LIQD Take 1 Container by mouth daily. Patient taking differently: Take 1 Container 2 (two) times daily by mouth.  03/04/15   Katheren Shams, DO  spironolactone (ALDACTONE) 25 MG tablet Take 1 tablet  (25 mg total) by mouth daily. 05/07/19   Shirley, [place], DO  WIXELA INHUB 500-50 MCG/DOSE AEPB INHALE 1 PUFF BY MOUTH TWICE A DAY 06/24/19   Enid Derry, [place], DO  Fluticasone-Salmeterol [facility] INHUB) 500-50 MCG/DOSE AEPB Inhale 1 puff into the lungs 2 (two) times daily. 10/27/17   Shirley, [place], DO  montelukast (SINGULAIR) 10 MG tablet TAKE 1 TABLET BY MOUTH EVERYDAY AT BEDTIME 07/27/18   Enid Derry [place], DO  spironolactone (ALDACTONE) 25 MG tablet TAKE 1 TABLET BY MOUTH EVERY  DAY 10/17/18   Shirley, [place], DO    Allergies    Patient has no known allergies.  Review of Systems   Review of Systems  Constitutional: Negative for appetite change.  HENT: Negative for congestion.   Respiratory: Negative for shortness of breath.   Cardiovascular: Negative for chest pain.  Gastrointestinal: Negative for abdominal pain.  Musculoskeletal: Negative for back pain.  Skin: Negative for rash.  Neurological: Positive for tremors, weakness and headaches.  Psychiatric/Behavioral: Negative for confusion.    Physical Exam Updated Vital Signs BP 130/61 (BP Location: Right Arm)   Pulse 84   Temp 99.6 F (37.6 C) (Oral)   Resp (!) 22   SpO2 95%   Physical Exam Vitals and nursing note reviewed.  Eyes:     General: Scleral icterus present.  Cardiovascular:     Rate and Rhythm: Tachycardia present.  Pulmonary:     Breath sounds: No wheezing or rhonchi.  Abdominal:     General: There is no distension.  Musculoskeletal:        General: No swelling.     Cervical back: No rigidity.  Neurological:     Mental Status: He is alert.     Comments: Patient is awake and pleasant.  Diffuse tremors however.  Face symmetric.  Difficult finger-nose bilateral due to the tremor.  `     ED Results / Procedures / Treatments   Labs (all labs ordered are listed, but only abnormal results are displayed) Labs Reviewed  CBC - Abnormal; Notable for the following components:      Result Value   MCV 104.6 (*)     MCH 35.3 (*)    All other components within normal limits  COMPREHENSIVE METABOLIC PANEL - Abnormal; Notable for the following components:   BUN 34 (*)    Creatinine, Ser 2.31 (*)    Albumin 3.4 (*)    AST 53 (*)    Total Bilirubin 1.5 (*)    GFR calc non Af Amer 26 (*)    GFR calc Af Amer 30 (*)    All other components within normal limits  SEDIMENTATION RATE - Abnormal; Notable for the following components:   Sed Rate 40 (*)    All other components within normal limits  PROTIME-INR  APTT  DIFFERENTIAL  AMMONIA    EKG EKG Interpretation  Date/Time:  Thursday July 18 2019 09:52:22 EST Ventricular Rate:  69 PR Interval:    QRS Duration: 84 QT Interval:  372 QTC Calculation: 393 R Axis:   39 Text Interpretation: Atrial flutter LVH by voltage ST depr, consider ischemia, inferior leads Confirmed by Davonna Belling [phone]) on 07/18/2019 10:22:58 AM   Radiology CT HEAD WO CONTRAST  Result Date: 07/18/2019 CLINICAL DATA:  Headache for 5 days EXAM: CT HEAD WITHOUT CONTRAST TECHNIQUE: Contiguous axial images were obtained from the base of the skull through the vertex without intravenous contrast. COMPARISON:  None. FINDINGS: Brain: No evidence of acute infarction, hemorrhage, hydrocephalus, extra-axial collection or mass lesion/mass effect. Scattered low-density changes within the periventricular and subcortical white matter compatible with chronic microvascular ischemic change. Mild diffuse cerebral volume loss. Vascular: Atherosclerotic calcifications involving the large vessels of the skull base. No unexpected hyperdense vessel. Skull: Normal. Negative for fracture or focal lesion. Sinuses/Orbits: No acute finding. Other: None. IMPRESSION: No acute intracranial findings. Electronically Signed   By: Davina Poke D.O.   On: 07/18/2019 09:30    Procedures Procedures (including critical care time)  Medications Ordered in ED Medications -  No data to display  ED Course  I have  reviewed the triage vital signs and the nursing notes.  Pertinent labs & imaging results that were available during my care of the patient were reviewed by me and considered in my medical decision making (see chart for details).    MDM Rules/Calculators/A&P                      Patient headache.  Tremor.  History of cirrhosis.  CT scan reassuring.  Sed rate only mildly elevated.  Lab work overall reassuring with creatinine mildly above release baseline.  Normal ammonia.  Has help at home.  Has outpatient doctor that can also help follow.  Feels better.  Will discharge home Final Clinical Impression(s) / ED Diagnoses Final diagnoses:  Generalized headache  Tremor    Rx / DC Orders ED Discharge Orders    None       Davonna Belling, MD 07/18/19 1517

## 2019-07-18 NOTE — Discharge Instructions (Signed)
Follow-up with your doctor for the headache and the tremors.

## 2019-07-18 NOTE — ED Triage Notes (Signed)
Pt reports severe left sided headache that has be ongoing for the past week. Pt also reports severe arm tremors since the headache started. Pt a.o

## 2019-07-18 NOTE — ED Notes (Signed)
Pt provided 2 more warm blankets.

## 2019-07-20 ENCOUNTER — Emergency Department (HOSPITAL_COMMUNITY): Payer: Medicare Other

## 2019-07-20 ENCOUNTER — Emergency Department (HOSPITAL_COMMUNITY)
Admission: EM | Admit: 2019-07-20 | Discharge: 2019-07-20 | Disposition: A | Discharge status: Home / Self Care | Payer: Medicare Other | Attending: Emergency Medicine | Admitting: Emergency Medicine

## 2019-07-20 ENCOUNTER — Telehealth: Payer: Self-pay | Admitting: Physician Assistant

## 2019-07-20 ENCOUNTER — Other Ambulatory Visit: Payer: Self-pay

## 2019-07-20 DIAGNOSIS — U071 COVID-19: Secondary | ICD-10-CM | POA: Diagnosis not present

## 2019-07-20 DIAGNOSIS — Z79899 Other long term (current) drug therapy: Secondary | ICD-10-CM | POA: Diagnosis not present

## 2019-07-20 DIAGNOSIS — I129 Hypertensive chronic kidney disease with stage 1 through stage 4 chronic kidney disease, or unspecified chronic kidney disease: Secondary | ICD-10-CM | POA: Insufficient documentation

## 2019-07-20 DIAGNOSIS — R251 Tremor, unspecified: Secondary | ICD-10-CM | POA: Diagnosis not present

## 2019-07-20 DIAGNOSIS — R0602 Shortness of breath: Secondary | ICD-10-CM | POA: Diagnosis not present

## 2019-07-20 DIAGNOSIS — E860 Dehydration: Secondary | ICD-10-CM

## 2019-07-20 DIAGNOSIS — R519 Headache, unspecified: Secondary | ICD-10-CM | POA: Diagnosis present

## 2019-07-20 DIAGNOSIS — R509 Fever, unspecified: Secondary | ICD-10-CM

## 2019-07-20 DIAGNOSIS — N183 Chronic kidney disease, stage 3 unspecified: Secondary | ICD-10-CM | POA: Insufficient documentation

## 2019-07-20 DIAGNOSIS — I10 Essential (primary) hypertension: Secondary | ICD-10-CM | POA: Diagnosis not present

## 2019-07-20 DIAGNOSIS — R05 Cough: Secondary | ICD-10-CM | POA: Diagnosis not present

## 2019-07-20 DIAGNOSIS — J45909 Unspecified asthma, uncomplicated: Secondary | ICD-10-CM | POA: Diagnosis not present

## 2019-07-20 LAB — COMPREHENSIVE METABOLIC PANEL
ALT: 86 U/L — ABNORMAL HIGH (ref 0–44)
AST: 167 U/L — ABNORMAL HIGH (ref 15–41)
Albumin: 3.2 g/dL — ABNORMAL LOW (ref 3.5–5.0)
Alkaline Phosphatase: 60 U/L (ref 38–126)
Anion gap: 13 (ref 5–15)
BUN: 48 mg/dL — ABNORMAL HIGH (ref 8–23)
CO2: 24 mmol/L (ref 22–32)
Calcium: 9.3 mg/dL (ref 8.9–10.3)
Chloride: 101 mmol/L (ref 98–111)
Creatinine, Ser: 2.65 mg/dL — ABNORMAL HIGH (ref 0.61–1.24)
GFR calc Af Amer: 26 mL/min — ABNORMAL LOW (ref >60)
GFR calc non Af Amer: 22 mL/min — ABNORMAL LOW (ref >60)
Glucose, Bld: 107 mg/dL — ABNORMAL HIGH (ref 70–99)
Potassium: 4.4 mmol/L (ref 3.5–5.1)
Sodium: 138 mmol/L (ref 135–145)
Total Bilirubin: 2.3 mg/dL — ABNORMAL HIGH (ref 0.3–1.2)
Total Protein: 8.1 g/dL (ref 6.5–8.1)

## 2019-07-20 LAB — CBC WITH DIFFERENTIAL/PLATELET
Abs Immature Granulocytes: 0.02 10*3/uL (ref 0.00–0.07)
Basophils Absolute: 0 10*3/uL (ref 0.0–0.1)
Basophils Relative: 0 %
Eosinophils Absolute: 0.2 10*3/uL (ref 0.0–0.5)
Eosinophils Relative: 3 %
HCT: 44.9 % (ref 39.0–52.0)
Hemoglobin: 15.3 g/dL (ref 13.0–17.0)
Immature Granulocytes: 0 %
Lymphocytes Relative: 20 %
Lymphs Abs: 1.3 10*3/uL (ref 0.7–4.0)
MCH: 34.6 pg — ABNORMAL HIGH (ref 26.0–34.0)
MCHC: 34.1 g/dL (ref 30.0–36.0)
MCV: 101.6 fL — ABNORMAL HIGH (ref 80.0–100.0)
Monocytes Absolute: 0.4 10*3/uL (ref 0.1–1.0)
Monocytes Relative: 6 %
Neutro Abs: 4.8 10*3/uL (ref 1.7–7.7)
Neutrophils Relative %: 71 %
Platelets: 116 10*3/uL — ABNORMAL LOW (ref 150–400)
RBC: 4.42 MIL/uL (ref 4.22–5.81)
RDW: 12.1 % (ref 11.5–15.5)
WBC: 6.8 10*3/uL (ref 4.0–10.5)
nRBC: 0 % (ref 0.0–0.2)

## 2019-07-20 LAB — ETHANOL: Alcohol, Ethyl (B): <10 mg/dL (ref <10)

## 2019-07-20 MED ORDER — LORAZEPAM 0.5 MG PO TABS: 0.5000 mg | ORAL_TABLET | Freq: Three times a day (TID) | ORAL | 0 refills | Status: DC | PRN

## 2019-07-20 MED ORDER — LORAZEPAM 1 MG PO TABS
0.5000 mg | ORAL_TABLET | Freq: Once | ORAL | Status: AC
  Administered 2019-07-20: 0.5 mg via ORAL
  Filled 2019-07-20: qty 1

## 2019-07-20 MED ORDER — ACETAMINOPHEN 325 MG PO TABS
650.0000 mg | ORAL_TABLET | Freq: Once | ORAL | Status: AC
  Administered 2019-07-20: 650 mg via ORAL
  Filled 2019-07-20: qty 2

## 2019-07-20 NOTE — ED Provider Notes (Signed)
[facility] Provider Note   CSN: [id number] Arrival date & time: 07/20/19  1507     History Chief Complaint  Patient presents with  . Headache  . Shortness of Breath  . Chills    Donald Moreno is a 79 y.o. male.  HPI   Patient states that he is here because "I feel like crap."  He complains of feeling cold while sitting in the waiting room.  He complains of shaking, and just not feeling like himself.  Fever, cough, chest pain, focal weakness or paresthesia.  There are no other known modifying factors.     Past Medical History:  Diagnosis Date  . Acute congestive heart failure ([place])   . Arthritis    "all over"  . Asthma   . Blood in stool 04/11/2017  . Chronic kidney disease    "I see a kidney dr @ [facility]" (10/11/2016)  . Chronic lower back pain    "turned a truck over a long time ago" (10/11/2016)  . Dysrhythmia    bradycardia with occassional junctional rhythm  . ETOH abuse   . Gout   . Hypertension   . Inguinal hernia 10/28/2014   S/P inguinal hernia repair with 4L as ascites removed 03/30/17  . Liver cirrhosis ([place]) 2015  . Macrocytosis   . Pneumonia    "long long time ago" (10/11/2016)  . Spontaneous bacterial peritonitis ([place]) 07/01/2015    Patient Active Problem List   Diagnosis Date Noted  . History of anemia due to CKD 02/20/2018  . Secondary hyperparathyroidism of renal origin ([place]) 02/20/2018  . Corn or callus 07/11/2017  . Macrocytic anemia 04/20/2017  . Cirrhosis of liver with ascites ([place])   . Congestive heart failure ([place])   . Hematuria 01/30/2017  . Alcoholic cirrhosis of liver with ascites ([place])   . Typical atrial flutter ([place])   . CKD (chronic kidney disease) stage 3, GFR 30-59 ml/min (HCC) 05/27/2015  . At risk for decreased bone density 03/04/2015  . Bradycardia 07/08/2013  . Advanced directives, counseling/discussion 01/03/2011  . Asthma 01/29/2010  . Gout 07/13/2006  . HYPERTENSION,  BENIGN SYSTEMIC 07/13/2006  . Arthritis 07/13/2006    Past Surgical History:  Procedure Laterality Date  . INGUINAL HERNIA REPAIR Bilateral 03/30/2017   Procedure: OPEN BILATERAL INGUINAL HERNIA REPAIR WITH MESH;  Surgeon: Kinsinger, Arta Bruce, MD;  Location: [facility];  Service: General;  Laterality: Bilateral;  GENERAL COMBINED WITH REGIONAL FOR POST OP PAIN   . INSERTION OF MESH N/A 10/16/2016   Procedure: INSERTION OF MESH;  Surgeon: Kinsinger, Arta Bruce, MD;  Location: [facility];  Service: General;  Laterality: N/A;  . INSERTION OF MESH Bilateral 03/30/2017   Procedure: INSERTION OF MESH;  Surgeon: Kieth Brightly Arta Bruce, MD;  Location: [facility];  Service: General;  Laterality: Bilateral;  GENERAL COMBINED WITH REGIONAL FOR POST OP PAIN   . IR PARACENTESIS  08/19/2016  . IR PARACENTESIS  08/26/2016  . IR PARACENTESIS  09/02/2016  . IR PARACENTESIS  09/09/2016  . IR PARACENTESIS  09/16/2016  . IR PARACENTESIS  10/03/2016  . IR PARACENTESIS  10/11/2016  . IR RADIOLOGIST EVAL & MGMT  09/29/2016  . IR RADIOLOGIST EVAL & MGMT  12/21/2016  . IR RADIOLOGIST EVAL & MGMT  01/23/2018  . IR RADIOLOGIST EVAL & MGMT  02/19/2019  . IR TIPS  10/11/2016  . RADIOLOGY WITH ANESTHESIA N/A 10/11/2016   Procedure: TIPS;  Surgeon: Arne Cleveland, MD;  Location: [facility];  Service: Radiology;  Laterality: N/A;  . UMBILICAL HERNIA REPAIR N/A 10/16/2016   Procedure: HERNIA REPAIR UMBILICAL ADULT;  Surgeon: Kinsinger, Arta Bruce, MD;  Location: [facility];  Service: General;  Laterality: N/A;       Family History  Problem Relation Age of Onset  . Other Mother        died when pt was only 22 - unknown cause.  . Other Father        deceased.  . Asthma Sister   . Heart disease Sister        multiple stents.  . Asthma Brother   . Hypertension Brother   . Asthma Son   . Asthma Brother   . Hypertension Brother     Social History   Tobacco Use  . Smoking status: Never Smoker  . Smokeless tobacco: Current User    Types: Snuff,  Chew  Substance Use Topics  . Alcohol use: No    Comment: quit drinking "over 2 yr ago" (10/11/2016)  . Drug use: No    Home Medications Prior to Admission medications   Medication Sig Start Date End Date Taking? Authorizing Provider  albuterol (PROAIR HFA) 108 (90 Base) MCG/ACT inhaler Inhale 2 puffs into the lungs every 4 (four) hours as needed for wheezing. 05/24/18   Shirley, [place], DO  allopurinol (ZYLOPRIM) 100 MG tablet TAKE 1/2 TABLET BY MOUTH EVERY DAY 03/18/19   Shirley, [place], DO  bisoprolol (ZEBETA) 10 MG tablet TAKE 1 TABLET BY MOUTH EVERY DAY 02/25/19   Sherran Needs, NP  Cholecalciferol (VITAMIN D-3) 5000 units TABS Take 5,000 Units daily by mouth.     [provider]  ferrous sulfate 325 (65 FE) MG tablet Take 1 tablet (325 mg total) by mouth 2 (two) times daily with a meal. Patient taking differently: Take 325 mg by mouth every Monday, Wednesday, and Friday.  10/21/14   Janora Norlander, DO  lactulose (CHRONULAC) 10 GM/15ML solution TAKE 15 MLS BY MOUTH 2 (TWO) TIMES DAILY AS NEEDED (NEED TO ENSURE 2 SOFT BM IN A DAY.). 05/06/19   Shirley, [place], DO  LORazepam (ATIVAN) 0.5 MG tablet Take 1 tablet (0.5 mg total) by mouth 3 (three) times daily as needed ([place]). 07/20/19   Daleen Bo, MD  montelukast (SINGULAIR) 10 MG tablet TAKE 1 TABLET BY MOUTH EVERYDAY AT BEDTIME 01/30/19   Shirley, [place], DO  Multiple Vitamin (MULTIVITAMIN WITH MINERALS) TABS tablet Take 1 tablet by mouth daily. 04/18/17   Shirley, [place], DO  Nutritional Supplements (ENSURE COMPLETE SHAKE) LIQD Take 1 Container by mouth daily. Patient taking differently: Take 1 Container 2 (two) times daily by mouth.  03/04/15   Katheren Shams, DO  spironolactone (ALDACTONE) 25 MG tablet Take 1 tablet (25 mg total) by mouth daily. 05/07/19   Shirley, [place], DO  WIXELA INHUB 500-50 MCG/DOSE AEPB INHALE 1 PUFF BY MOUTH TWICE A DAY 06/24/19   Enid Derry, [place], DO  Fluticasone-Salmeterol [facility] INHUB) 500-50  MCG/DOSE AEPB Inhale 1 puff into the lungs 2 (two) times daily. 10/27/17   Shirley, [place], DO  furosemide (LASIX) 40 MG tablet TAKE 1 TAB 3 TIMES A DAY FOR WEIGHT >168LB 2 TIMES A DAY FOR WEIGHT 163-168LB ONCE DAILY FOR WEIGHT <163LB 12/24/18 07/20/19  Enid Derry, [place], DO  montelukast (SINGULAIR) 10 MG tablet TAKE 1 TABLET BY MOUTH EVERYDAY AT BEDTIME 07/27/18   Enid Derry, [place], DO  spironolactone (ALDACTONE) 25 MG tablet TAKE 1 TABLET BY MOUTH EVERY DAY 10/17/18   Shirley, [place], DO  Allergies    Patient has no known allergies.  Review of Systems   Review of Systems  All other systems reviewed and are negative.   Physical Exam Updated Vital Signs BP 135/75   Pulse 60   Temp (!) 103 F (39.4 C) (Oral)   Resp (!) 24   Ht 6\' 1"  (1.854 m)   Wt 80 kg   SpO2 100%   BMI 23.27 kg/m   Physical Exam Vitals and nursing note reviewed.  Constitutional:      Appearance: He is well-developed.  HENT:     Head: Normocephalic and atraumatic.     Right Ear: External ear normal.     Left Ear: External ear normal.     Mouth/Throat:     Mouth: Mucous membranes are moist.     Pharynx: No oropharyngeal exudate or posterior oropharyngeal erythema.  Eyes:     Conjunctiva/sclera: Conjunctivae normal.     Pupils: Pupils are equal, round, and reactive to light.  Neck:     Trachea: Phonation normal.  Cardiovascular:     Rate and Rhythm: Normal rate and regular rhythm.     Heart sounds: Normal heart sounds.  Pulmonary:     Effort: Pulmonary effort is normal.     Breath sounds: Normal breath sounds.  Abdominal:     Palpations: Abdomen is soft.     Tenderness: There is no abdominal tenderness.  Musculoskeletal:        General: Normal range of motion.     Cervical back: Normal range of motion and neck supple.  Skin:    General: Skin is warm and dry.  Neurological:     Mental Status: He is alert and oriented to person, place, and time.     Cranial Nerves: No cranial nerve deficit.     Sensory:  No sensory deficit.     Motor: No abnormal muscle tone.     Coordination: Coordination normal.     Comments: No dysarthria or aphasia.  Psychiatric:        Mood and Affect: Mood normal.        Behavior: Behavior normal.        Thought Content: Thought content normal.        Judgment: Judgment normal.     Comments: He is tremulous.  He is lucid.     ED Results / Procedures / Treatments   Labs (all labs ordered are listed, but only abnormal results are displayed) Labs Reviewed  COMPREHENSIVE METABOLIC PANEL - Abnormal; Notable for the following components:      Result Value   Glucose, Bld 107 (*)    BUN 48 (*)    Creatinine, Ser 2.65 (*)    Albumin 3.2 (*)    AST 167 (*)    ALT 86 (*)    Total Bilirubin 2.3 (*)    GFR calc non Af Amer 22 (*)    GFR calc Af Amer 26 (*)    All other components within normal limits  CBC WITH DIFFERENTIAL/PLATELET - Abnormal; Notable for the following components:   MCV 101.6 (*)    MCH 34.6 (*)    Platelets 116 (*)    All other components within normal limits  SARS CORONAVIRUS 2 (TAT 6-24 HRS)  ETHANOL    EKG EKG Interpretation  Date/Time:  Saturday July 20 2019 15:18:26 EST Ventricular Rate:  100 PR Interval:    QRS Duration: 74 QT Interval:  324 QTC Calculation: 417 R Axis:  47 Text Interpretation: Atrial flutter with variable A-V block Abnormal ECG since last tracing no significant change Confirmed by Daleen Bo [phone]) on 07/20/2019 8:38:45 PM   EKG Interpretation  Date/Time:  Saturday July 20 2019 15:20:29 EST Ventricular Rate:  101 PR Interval:    QRS Duration: 86 QT Interval:  344 QTC Calculation: 446 R Axis:   49 Text Interpretation: Atrial flutter with variable A-V block Abnormal ECG Since last tracing of earlier today No significant change was found Confirmed by Daleen Bo [id number]) on 07/20/2019 8:39:55 PM        Radiology DG Chest 2 View  Result Date: 07/20/2019 CLINICAL DATA:  79 year old with chronic  cough, shortness of breath, headache and chills. EXAM: CHEST - 2 VIEW COMPARISON:  04/11/2017 and earlier. FINDINGS: Cardiac silhouette upper normal in size, unchanged. Thoracic aorta atherosclerotic, unchanged. Hilar and mediastinal contours otherwise unremarkable. Emphysematous changes in both lungs and mildly prominent bronchovascular markings diffusely with mild central peribronchial thickening, unchanged. Scarring in the lower lobes. Lungs otherwise clear. No confluent or ground-glass airspace consolidation. No pleural effusions. Normal pulmonary vascularity. Degenerative changes involving the thoracic spine. IMPRESSION: COPD/emphysema. Scarring involving the lower lobes. No acute cardiopulmonary disease. Electronically Signed   By: Evangeline Dakin M.D.   On: 07/20/2019 15:55    Procedures Procedures (including critical care time)  Medications Ordered in ED Medications  LORazepam (ATIVAN) tablet 0.5 mg (0.5 mg Oral Given 07/20/19 2000)  acetaminophen (TYLENOL) tablet 650 mg (650 mg Oral Given 07/20/19 2106)    ED Course  I have reviewed the triage vital signs and the nursing notes.  Pertinent labs & imaging results that were available during my care of the patient were reviewed by me and considered in my medical decision making (see chart for details).  Clinical Course as of Jul 19 2120  Sat Jul 20, 2019  2037 Normal except MCV high, MCH high, platelets low  CBC with Differential(!) [EW]  2037 Normal except glucose high, BUN high, creatinine high, albumin low, AST high, ALT high, total bilirubin high, GFR low  Comprehensive metabolic panel(!) [EW]  7408 Normal   [EW]  2038 No infiltrate or CHF per radiology interpretation  DG Chest 2 View [EW]  2054 Patient's nephew is here now and states that he was with the patient before coming here and the patient was having trouble walking, when he stood up, and complained of headache.  Patient is improved at this time per the nephew.  Findings  discussed.   [EW]  2055 I discussed the finding of temperature elevation at 1844, with the patient's nurse, who was unaware of it.  I had not been notified of it.  We are going to recheck the temperature at this time.   [EW]  2057 Total Protein: 8.1 [EW]    Clinical Course User Index [EW] Daleen Bo, MD   MDM Rules/Calculators/A&P                       Patient Vitals for the past 24 hrs:  BP Temp Temp src Pulse Resp SpO2 Height Weight  07/20/19 2059 -- (!) 103 F (39.4 C) Oral -- -- -- -- --  07/20/19 1915 135/75 -- -- -- -- -- -- --  07/20/19 1913 -- -- -- 60 (!) 24 100 % -- --  07/20/19 1909 132/65 -- -- 65 -- 100 % -- --  07/20/19 1904 -- -- -- -- (!) 23 -- -- --  07/20/19 1900 -- -- -- -- Marland Kitchen  24 -- -- --  07/20/19 1845 (!) 148/80 -- -- (!) 53 -- 100 % -- --  07/20/19 1844 (!) 147/74 (!) 102.5 F (39.2 C) Oral (!) 56 (!) 25 100 % -- --  07/20/19 1843 -- -- -- 61 (!) 21 100 % -- --  07/20/19 1842 (!) 147/74 -- -- -- -- -- -- --  07/20/19 1737 126/67 -- -- (!) 58 16 97 % -- --  07/20/19 1519 -- -- -- -- -- -- 6\' 1"  (1.854 m) 80 kg  07/20/19 1518 117/81 97.9 F (36.6 C) Oral (!) 105 18 98 % -- --    9:11 PM Reevaluation with update and discussion. After initial assessment and treatment, an updated evaluation reveals repeat temperature elevated 103.  Patient remains asymptomatic at this time.  He only has sporadic intermittent cough.  He is not producing sputum.  He does not have any dysuria or urinary frequency.  Patient's nephew in the room as we discussed this.  He request a Covid test which will be sent.  All findings discussed and questions answered. Daleen Bo   Medical Decision Making: Nonspecific symptoms, with shaking/trembling, and findings consistent with intravascular volume depletion, elevation of BUN and creatinine.  Chest x-ray without heart failure, or infiltrate.  No overt signs of infection process.  Will hold Lasix until seeing cardiology later this week.   Give a short trial of Ativan to use for shaking.  CRITICAL CARE- no Performed by: Daleen Bo   Nursing Notes Reviewed/ Care Coordinated Applicable Imaging Reviewed Interpretation of Laboratory Data incorporated into ED treatment  The patient appears reasonably screened and/or stabilized for discharge and I doubt any other medical condition or other [facility] requiring further screening, evaluation, or treatment in the ED at this time prior to discharge.  Plan: Home Medications-hold Lasix until seen cardiology on 07/25/2019.  Continue usual and use Tylenol as needed for fever or pain.; Home Treatments-rest, low-salt diet, gradual advance activity; return here if the recommended treatment, does not improve the symptoms; Recommended follow up-cardiology has scheduled on 07/25/2019, to discuss reinitiation of Lasix treatment   Final Clinical Impression(s) / ED Diagnoses Final diagnoses:  Dehydration  Fever, unspecified fever cause  Shaking    Rx / DC Orders ED Discharge Orders         Ordered    LORazepam (ATIVAN) 0.5 MG tablet  3 times daily PRN     07/20/19 2120           Daleen Bo, MD 07/20/19 2122

## 2019-07-20 NOTE — ED Notes (Signed)
Two unsuccessful attempts at IV access. 

## 2019-07-20 NOTE — Discharge Instructions (Addendum)
It appears that most of your symptoms are related to your being a little dehydrated.  Stop taking the Lasix, until you see your cardiologist on 07/25/2019.  You have a fever that needs to be treated with Tylenol, also known as acetaminophen, 650 mg every 4 hours.  We sent a Covid test, which should return within the next 6 to 24 hours to see if you have Covid.  This is unlikely since you have had your first Covid vaccine.  Return here if your symptoms worsen and you cannot control them.  Follow-up with your primary care doctor, as needed.

## 2019-07-20 NOTE — ED Triage Notes (Signed)
Pt bib family with reports of ongoing shob, cough, headache and chills. Family states pt BP was 90/54 with an elevated HR earlier today. Pt speaking in complete sentences.

## 2019-07-20 NOTE — Telephone Encounter (Signed)
Donald Moreno is a 79 y.o. male with atrial flutter, diastolic heart failure, cirrhosis.  He is not on anticoagulation secondary to liver disease.  He went to the emergency room with a headache on 07/18/2019.  Head CT was negative.  [name] called the answering service because the patient's monitor demonstrated low blood pressures and elevated heart rates.  His blood pressure was ranging 80-97/54-63 and heart rate 112-120.  I contacted the patient.  He notes that he has been short of breath for the past several days.  I can tell he is breathing heavier on the phone.  He denies orthopnea or leg swelling or weight gain.  He has not had chest pain, syncope.  He does feel fatigued.  He has not had any fever, cough. PLAN: It is difficult to assess the patient over the telephone.  I am hesitant to reduce his bisoprolol as his heart rate is elevated.  He does have a history of heart failure and notes worsening shortness of breath.  However, he has not had orthopnea or leg swelling.  He notes weight loss instead of weight gain.  I have asked him to go back to the emergency room for further evaluation and management.  I will send a message to our office to arrange earlier cardiac follow-up this week. Richardson Dopp, PA-C    07/20/2019 2:45 PM

## 2019-07-21 LAB — SARS CORONAVIRUS 2 (TAT 6-24 HRS): SARS Coronavirus 2: POSITIVE — AB

## 2019-07-22 ENCOUNTER — Telehealth: Payer: Self-pay | Admitting: *Deleted

## 2019-07-22 ENCOUNTER — Other Ambulatory Visit: Payer: Self-pay | Admitting: Adult Health

## 2019-07-22 DIAGNOSIS — U071 COVID-19: Secondary | ICD-10-CM

## 2019-07-22 MED ORDER — SODIUM CHLORIDE 0.9 % IV SOLN
700.0000 mg | Freq: Once | INTRAVENOUS | Status: AC
  Administered 2019-07-23: 700 mg via INTRAVENOUS
  Filled 2019-07-22: qty 700

## 2019-07-22 NOTE — Progress Notes (Signed)
  I connected by phone with Roderic Palau, legal guardian of Xayvier Vallez on 07/22/2019 at 11:35 AM to discuss the potential use of an new treatment for mild to moderate COVID-19 viral infection in non-hospitalized patients.  This patient is a 79 y.o. male that meets the FDA criteria for Emergency Use Authorization of bamlanivimab or casirivimab\imdevimab.  Has a (+) direct SARS-CoV-2 viral test result  Has mild or moderate COVID-19   Is ? 79 years of age and weighs ? 40 kg  Is NOT hospitalized due to COVID-19  Is NOT requiring oxygen therapy or requiring an increase in baseline oxygen flow rate due to COVID-19  Is within 10 days of symptom onset  Has at least one of the high risk factor(s) for progression to severe COVID-19 and/or hospitalization as defined in EUA.  Specific high risk criteria : >/= 77 yo, heart issues  Symptom onset 07/16/2019  Symtoms--cough, fatigue, denies shortness of breath   I have spoken and communicated the following to the patient or parent/caregiver:  1. FDA has authorized the emergency use of bamlanivimab and casirivimab\imdevimab for the treatment of mild to moderate COVID-19 in adults and pediatric patients with positive results of direct SARS-CoV-2 viral testing who are 58 years of age and older weighing at least 40 kg, and who are at high risk for progressing to severe COVID-19 and/or hospitalization.  2. The significant known and potential risks and benefits of bamlanivimab and casirivimab\imdevimab, and the extent to which such potential risks and benefits are unknown.  3. Information on available alternative treatments and the risks and benefits of those alternatives, including clinical trials.  4. Patients treated with bamlanivimab and casirivimab\imdevimab should continue to self-isolate and use infection control measures (e.g., wear mask, isolate, social distance, avoid sharing personal items, clean and disinfect "high touch" surfaces, and frequent  handwashing) according to CDC guidelines.   5. The patient or parent/caregiver has the option to accept or refuse bamlanivimab or casirivimab\imdevimab .  After reviewing this information with the patient, The patient agreed to proceed with receiving the bamlanimivab infusion and will be provided a copy of the Fact sheet prior to receiving the infusion.Scot Dock 07/22/2019 11:35 AM

## 2019-07-22 NOTE — Telephone Encounter (Signed)
Patient is scheduled on Wednesday to see Dr. Enid Derry but due to a positive Covid test in the last 5 days, patient will need to either change this to virtual or reschedule.  I have attempted to call him but there was no answer and the phone seemed to be disconnected.  Will go ahead and change this virtual unless patient calls to cancel.  Ferry Matthis,CMA

## 2019-07-23 ENCOUNTER — Ambulatory Visit (HOSPITAL_COMMUNITY)
Admission: RE | Admit: 2019-07-23 | Discharge: 2019-07-23 | Disposition: A | Discharge status: Home / Self Care | Payer: Medicare Other | Source: Ambulatory Visit | Attending: Pulmonary Disease | Admitting: Pulmonary Disease

## 2019-07-23 ENCOUNTER — Ambulatory Visit: Payer: Medicare Other | Attending: Internal Medicine

## 2019-07-23 DIAGNOSIS — Z23 Encounter for immunization: Secondary | ICD-10-CM | POA: Diagnosis not present

## 2019-07-23 DIAGNOSIS — U071 COVID-19: Secondary | ICD-10-CM | POA: Insufficient documentation

## 2019-07-23 MED ORDER — ALBUTEROL SULFATE HFA 108 (90 BASE) MCG/ACT IN AERS: 2.0000 | INHALATION_SPRAY | Freq: Once | RESPIRATORY_TRACT | Status: DC | PRN

## 2019-07-23 MED ORDER — METHYLPREDNISOLONE SODIUM SUCC 125 MG IJ SOLR: 125.0000 mg | Freq: Once | INTRAMUSCULAR | Status: DC | PRN

## 2019-07-23 MED ORDER — DIPHENHYDRAMINE HCL 50 MG/ML IJ SOLN: 50.0000 mg | Freq: Once | INTRAMUSCULAR | Status: DC | PRN

## 2019-07-23 MED ORDER — SODIUM CHLORIDE 0.9 % IV SOLN
INTRAVENOUS | Status: DC | PRN
  Administered 2019-07-23: 250 mL via INTRAVENOUS

## 2019-07-23 MED ORDER — EPINEPHRINE 0.3 MG/0.3ML IJ SOAJ: 0.3000 mg | Freq: Once | INTRAMUSCULAR | Status: DC | PRN

## 2019-07-23 MED ORDER — FAMOTIDINE IN NACL 20-0.9 MG/50ML-% IV SOLN: 20.0000 mg | Freq: Once | INTRAVENOUS | Status: DC | PRN

## 2019-07-23 NOTE — Discharge Instructions (Signed)

## 2019-07-23 NOTE — Progress Notes (Signed)
  Diagnosis: COVID-19  Physician: Dr. Wright  Procedure: [facility] Med: bamlanivimab infusion - Provided patient with bamlanimivab fact sheet for patients, parents and caregivers prior to infusion.  Complications: No immediate complications noted.  Discharge: Discharged home   Najeh Credit M Sally-Anne Wamble 07/23/2019  

## 2019-07-24 ENCOUNTER — Telehealth (INDEPENDENT_AMBULATORY_CARE_PROVIDER_SITE_OTHER): Payer: Medicare Other | Admitting: Family Medicine

## 2019-07-24 ENCOUNTER — Other Ambulatory Visit: Payer: Self-pay

## 2019-07-24 DIAGNOSIS — Z5320 Procedure and treatment not carried out because of patient's decision for unspecified reasons: Secondary | ICD-10-CM

## 2019-07-24 NOTE — Progress Notes (Signed)
[facility] Visit  Attempted to reach patient over the phone multiple times with no answer.  Did leave voicemail for patient to return call.  [place] Kendel Pesnell, DO PGY-3, [facility]

## 2019-07-29 ENCOUNTER — Encounter: Payer: Self-pay | Admitting: Cardiovascular Disease

## 2019-07-29 NOTE — Telephone Encounter (Signed)
Donald Moreno from [employer] to follow up on the patient's HR. She states it has been consistently between 117-120. Please advise.

## 2019-07-29 NOTE — Telephone Encounter (Signed)
Left a message for Donald Moreno and the patient to call back.

## 2019-07-29 NOTE — Telephone Encounter (Signed)
error 

## 2019-07-30 ENCOUNTER — Telehealth: Payer: Self-pay | Admitting: Cardiovascular Disease

## 2019-07-30 NOTE — Telephone Encounter (Addendum)
He is on the maximum dose of bisoprolol and has not tolerated addition of diltiazem due to low BP. Clearly current BP does not allow more meds. Avoiding amio due to cirrhosis of the liver and in the past it caused severe junctional bradycardia.  In the past he has generally tolerated the flutter at 120 well and it has resolved spontaneously. He is receiving infusion therapy for COVID-19. Flutter may persist until COVID infection subsides.  Please schedule AFib office visit in no less than 3 weeks.

## 2019-07-30 NOTE — Telephone Encounter (Signed)
Spoke with pt and pt feels fine no S/S Per pt does not drink any caffeine since last hospital visit and is taking meds as directed. Also detail message left for [place] at [facility] .Will forward message to Dr Sallyanne Kuster for review and recommendations

## 2019-07-30 NOTE — Telephone Encounter (Signed)
Spoke with patient regarding appointment with [facility] on 08/21/19 at 11:30 am.  Arrival tim eis 11:15 am for check in.  Patient voiced his understanding.  I will also mail an appointment calendar to him

## 2019-07-30 NOTE — Telephone Encounter (Signed)
Left a message for the patient to call back to inform him that Dr. Sallyanne Kuster would like for him to have an appointment with the Af[facility] clinic.

## 2019-07-30 NOTE — Telephone Encounter (Signed)
Appointment made for 08/21/19 at the afib clinic.

## 2019-07-31 ENCOUNTER — Telehealth: Payer: Self-pay | Admitting: Cardiovascular Disease

## 2019-07-31 NOTE — Telephone Encounter (Signed)
Lm that this has been addressed by Dr Sallyanne Kuster .Adonis Housekeeper

## 2019-07-31 NOTE — Telephone Encounter (Signed)
Follow Up:   Nurse said she had called on 07-29-19 stating that pt was having Tachycardia. She wanted you to know pt is still in Tachycardia, rate is 120. She wants you to call pt and evaluate and see if pt needs to be seen. She says this have been going on for 2 weeks.o

## 2019-08-05 ENCOUNTER — Other Ambulatory Visit: Payer: Self-pay | Admitting: Family Medicine

## 2019-08-08 ENCOUNTER — Other Ambulatory Visit: Payer: Self-pay | Admitting: Family Medicine

## 2019-08-08 DIAGNOSIS — I509 Heart failure, unspecified: Secondary | ICD-10-CM

## 2019-08-21 ENCOUNTER — Other Ambulatory Visit: Payer: Self-pay

## 2019-08-21 ENCOUNTER — Encounter (HOSPITAL_COMMUNITY): Payer: Self-pay | Admitting: Nurse Practitioner

## 2019-08-21 ENCOUNTER — Ambulatory Visit (HOSPITAL_COMMUNITY)
Admission: RE | Admit: 2019-08-21 | Discharge: 2019-08-21 | Disposition: A | Discharge status: Home / Self Care | Payer: Medicare Other | Source: Ambulatory Visit | Attending: Nurse Practitioner | Admitting: Nurse Practitioner

## 2019-08-21 VITALS — BP 144/66 | HR 59 | Ht 73.0 in | Wt 162.0 lb

## 2019-08-21 DIAGNOSIS — Z79899 Other long term (current) drug therapy: Secondary | ICD-10-CM | POA: Insufficient documentation

## 2019-08-21 DIAGNOSIS — Z8616 Personal history of COVID-19: Secondary | ICD-10-CM | POA: Insufficient documentation

## 2019-08-21 DIAGNOSIS — N189 Chronic kidney disease, unspecified: Secondary | ICD-10-CM | POA: Insufficient documentation

## 2019-08-21 DIAGNOSIS — I4892 Unspecified atrial flutter: Secondary | ICD-10-CM | POA: Diagnosis not present

## 2019-08-21 DIAGNOSIS — I129 Hypertensive chronic kidney disease with stage 1 through stage 4 chronic kidney disease, or unspecified chronic kidney disease: Secondary | ICD-10-CM | POA: Insufficient documentation

## 2019-08-21 NOTE — Progress Notes (Addendum)
Primary Care Physician: Shirley, [place], DO Referring Physician: [facility] ER f/u Cardiologist: Dr. Weber Cooks Donald is a 79 y.o. male with a h/o  advanced liver cirrhosis and ah/o priorEtOH abuse(quit for 3-4 years) with portal HTN and ascites, atrial flutter with RVR in the past (not anticoagulated secondary to advanced liver disease), and CKDstage 3,.He was treated withAmiodarone for rate control and developed a junctional bradycardia, therefore this was discontinued and he was placed on Bisoprolol.  He was  seen in the ER 02/2018 with back and abdominal pain which has resolved. He was found to be in atrial flutter and was referred here. Initial EKG showed atrial  flutter at 104 bpm, however on exam today, HR was regular and in the 60's. Pt states that he is not symptomatic with flutter and it "comes and goes". He had f/u with Dr. Loletha Grayer about one month later, HR was elevated and bisoprolol was increased to 5 mg daily.  F/u in afib clinic, 05/24/18. He is asymptomatic and feels well but has v rate in the at 119 bpm, either accelerated junctional rhythm or atrial flutter. He is planning to go to [place] to visit his kids ia few weeks for one month.  F/u in the afib clinic, f/u ER. He states that he was watching TV after submitting his V/S to [place] earlier that am..The nurse called and wanted him to go to ER as his HR was around 100 bpm that am. She did not ask him to recheck HR. He went just to please her as he felt fine.Marland KitchenHe was checked out in the ER and was in a slow atrial flutter, his usual arrhythmia. Pt states that his pulse ox may have read erroneously as he had very long finger nails and may not have read properly. He has since cut his fingernail for his Pulse Ox finger. Ekg shows atrial flutter at 56 bpm today and he continues to feel great.  F/u in afib clinic, 08/21/19 for f/u of tachycardia.Pt has longstanding h/o of atrial flutter with either a controlled v rate  Or at  times RVR. He tolerates both and the RVR is not usually sustained.He is on the maximum dose of bisoprolol and has not tolerated addition of diltiazem due to low BP. Amio avoided  due to cirrhosis of the liver and in the past it caused severe junctional bradycardia.   In the past he has generally tolerated the flutter at 120 well and it has resolved spontaneously.   Pt had covid early March and a nurse noted  tachycardia around the time of  covid and antiviral infusion. This appointment was made for f/u.   He is now recovered from covid and his EKG shows atrial flutter at 59 bpm. He feels much improved.   Today, he denies symptoms of palpitations, chest pain, shortness of breath, orthopnea, PND, lower extremity edema, dizziness, presyncope, syncope, or neurologic sequela. The patient is tolerating medications without difficulties and is otherwise without complaint today.   Past Medical History:  Diagnosis Date  . Acute congestive heart failure ([place])   . Arthritis    "all over"  . Asthma   . Blood in stool 04/11/2017  . Chronic kidney disease    "I see a kidney dr @ [facility]" (10/11/2016)  . Chronic lower back pain    "turned a truck over a long time ago" (10/11/2016)  . Dysrhythmia    bradycardia with occassional junctional rhythm  . ETOH abuse   .  Gout   . Hypertension   . Inguinal hernia 10/28/2014   S/P inguinal hernia repair with 4L as ascites removed 03/30/17  . Liver cirrhosis ([place]) 2015  . Macrocytosis   . Pneumonia    "long long time ago" (10/11/2016)  . Spontaneous bacterial peritonitis ([place]) 07/01/2015   Past Surgical History:  Procedure Laterality Date  . INGUINAL HERNIA REPAIR Bilateral 03/30/2017   Procedure: OPEN BILATERAL INGUINAL HERNIA REPAIR WITH MESH;  Surgeon: Kinsinger, Arta Bruce, MD;  Location: [facility];  Service: General;  Laterality: Bilateral;  GENERAL COMBINED WITH REGIONAL FOR POST OP PAIN   . INSERTION OF MESH N/A 10/16/2016   Procedure: INSERTION OF  MESH;  Surgeon: Kinsinger, Arta Bruce, MD;  Location: [facility];  Service: General;  Laterality: N/A;  . INSERTION OF MESH Bilateral 03/30/2017   Procedure: INSERTION OF MESH;  Surgeon: Kieth Brightly Arta Bruce, MD;  Location: [facility];  Service: General;  Laterality: Bilateral;  GENERAL COMBINED WITH REGIONAL FOR POST OP PAIN   . IR PARACENTESIS  08/19/2016  . IR PARACENTESIS  08/26/2016  . IR PARACENTESIS  09/02/2016  . IR PARACENTESIS  09/09/2016  . IR PARACENTESIS  09/16/2016  . IR PARACENTESIS  10/03/2016  . IR PARACENTESIS  10/11/2016  . IR RADIOLOGIST EVAL & MGMT  09/29/2016  . IR RADIOLOGIST EVAL & MGMT  12/21/2016  . IR RADIOLOGIST EVAL & MGMT  01/23/2018  . IR RADIOLOGIST EVAL & MGMT  02/19/2019  . IR TIPS  10/11/2016  . RADIOLOGY WITH ANESTHESIA N/A 10/11/2016   Procedure: TIPS;  Surgeon: Arne Cleveland, MD;  Location: [facility];  Service: Radiology;  Laterality: N/A;  . UMBILICAL HERNIA REPAIR N/A 10/16/2016   Procedure: HERNIA REPAIR UMBILICAL ADULT;  Surgeon: Kinsinger, Arta Bruce, MD;  Location: [facility];  Service: General;  Laterality: N/A;    Current Outpatient Medications  Medication Sig Dispense Refill  . albuterol (PROAIR HFA) 108 (90 Base) MCG/ACT inhaler Inhale 2 puffs into the lungs every 4 (four) hours as needed for wheezing. 1 Inhaler 2  . allopurinol (ZYLOPRIM) 100 MG tablet TAKE 1/2 TABLET BY MOUTH EVERY DAY 90 tablet 0  . bisoprolol (ZEBETA) 10 MG tablet TAKE 1 TABLET BY MOUTH EVERY DAY 90 tablet 3  . Cholecalciferol (VITAMIN D-3) 5000 units TABS Take 5,000 Units daily by mouth.     . ferrous sulfate 325 (65 FE) MG tablet Take 1 tablet (325 mg total) by mouth 2 (two) times daily with a meal. (Patient taking differently: Take 325 mg by mouth every Monday, Wednesday, and Friday. ) 60 tablet 3  . FLUZONE HIGH-DOSE QUADRIVALENT 0.7 ML SUSY     . lactulose (CHRONULAC) 10 GM/15ML solution TAKE 15 MLS BY MOUTH 2 (TWO) TIMES DAILY AS NEEDED (NEED TO ENSURE 2 SOFT BM IN A DAY.). 2700 mL 2  .  LORazepam (ATIVAN) 0.5 MG tablet Take 1 tablet (0.5 mg total) by mouth 3 (three) times daily as needed (Shakiness). 12 tablet 0  . montelukast (SINGULAIR) 10 MG tablet TAKE 1 TABLET BY MOUTH EVERYDAY AT BEDTIME 90 tablet 1  . Nutritional Supplements (ENSURE COMPLETE SHAKE) LIQD Take 1 Container by mouth daily. (Patient taking differently: Take 1 Container 2 (two) times daily by mouth. ) 237 mL 11  . spironolactone (ALDACTONE) 25 MG tablet TAKE 1 TABLET BY MOUTH EVERY DAY 90 tablet 0  . WIXELA INHUB 500-50 MCG/DOSE AEPB INHALE 1 PUFF BY MOUTH TWICE A DAY 180 each 1   No current facility-administered medications for this  encounter.    No Known Allergies  Social History   Socioeconomic History  . Marital status: Divorced    Spouse name: Not on file  . Number of children: 5  . Years of education: 10  . Highest education level: Not on file  Occupational History  . Occupation: Retired- truck Geophysical data processor  . Smoking status: Never Smoker  . Smokeless tobacco: Current User    Types: Snuff, Chew  Substance and Sexual Activity  . Alcohol use: No    Comment: quit drinking "over 2 yr ago" (10/11/2016)  . Drug use: No  . Sexual activity: Not Currently  Other Topics Concern  . Not on file  Social History Narrative   Lives with nephew and niece in [place] home.     Tobacco: snuff all day (can last two days. Never smoked.       Hobbies: cooking, sleeping, going to store   Pets:  Dog, Bean   Social Determinants of Health   Financial Resource Strain:   . Difficulty of Paying Living Expenses:   Food Insecurity:   . Worried About Charity fundraiser in the Last Year:   . Arboriculturist in the Last Year:   Transportation Needs:   . Film/video editor (Medical):   Marland Kitchen Lack of Transportation (Non-Medical):   Physical Activity:   . Days of Exercise per Week:   . Minutes of Exercise per Session:   Stress:   . Feeling of Stress :   Social Connections:   . Frequency of  Communication with Friends and Family:   . Frequency of Social Gatherings with Friends and Family:   . Attends Religious Services:   . Active Member of Clubs or Organizations:   . Attends Archivist Meetings:   Marland Kitchen Marital Status:   Intimate Partner Violence:   . Fear of Current or Ex-Partner:   . Emotionally Abused:   Marland Kitchen Physically Abused:   . Sexually Abused:     Family History  Problem Relation Age of Onset  . Other Mother        died when pt was only 42 - unknown cause.  . Other Father        deceased.  . Asthma Sister   . Heart disease Sister        multiple stents.  . Asthma Brother   . Hypertension Brother   . Asthma Son   . Asthma Brother   . Hypertension Brother     ROS- All systems are reviewed and negative except as per the HPI above  Physical Exam: Vitals:   08/21/19 1157  BP: (!) 144/66  Pulse: (!) 59  Weight: 73.5 kg  Height: 6\' 1"  (1.854 m)   Wt Readings from Last 3 Encounters:  08/21/19 73.5 kg  07/20/19 80 kg  01/31/19 80.1 kg    Labs: Lab Results  Component Value Date   NA 138 07/20/2019   K 4.4 07/20/2019   CL 101 07/20/2019   CO2 24 07/20/2019   GLUCOSE 107 (H) 07/20/2019   BUN 48 (H) 07/20/2019   CREATININE 2.65 (H) 07/20/2019   CALCIUM 9.3 07/20/2019   PHOS 3.7 04/13/2017   MG 2.2 04/13/2017   Lab Results  Component Value Date   INR 1.1 07/18/2019   Lab Results  Component Value Date   CHOL 141 11/11/2011   HDL 71 11/11/2011   LDLCALC 44 11/11/2011   TRIG 128 11/11/2011     GEN-  The patient is well appearing, alert and oriented x 3 today.   Head- normocephalic, atraumatic Eyes-  Sclera clear, conjunctiva pink Ears- hearing intact Oropharynx- clear Neck- supple, no JVP Lymph- no cervical lymphadenopathy Lungs- Clear to ausculation bilaterally, normal work of breathing Heart- slow irregular rate and rhythm, no murmurs, rubs or gallops, PMI not laterally displaced GI- soft, NT, ND, + BS Extremities- no  clubbing, cyanosis, or edema MS- no significant deformity or atrophy Skin- no rash or lesion Psych- euthymic mood, full affect Neuro- strength and sensation are intact  EKG- Aflutter at 59 bpm, qrs int 82 ms, qtc 399  ms    Assessment and Plan: 1. Atrial flutter  Longstanding and intermittent RVR , asymptomatic Probably recent increase in tachycardia from  acute covid illness and therapeutics  Continue bisoprolol 10 mg daily Not anticoagulated  secondary to advanced liver disease   2. HTN Stable   F/u with afib  As needed Dr. Loletha Grayer per recall   Donald Moreno, [facility] [address] [place], [place] [address] [phone]

## 2019-08-28 ENCOUNTER — Emergency Department (HOSPITAL_COMMUNITY)
Admission: EM | Admit: 2019-08-28 | Discharge: 2019-08-28 | Disposition: A | Discharge status: Home / Self Care | Payer: Medicare Other | Attending: Emergency Medicine | Admitting: Emergency Medicine

## 2019-08-28 ENCOUNTER — Other Ambulatory Visit: Payer: Self-pay

## 2019-08-28 ENCOUNTER — Encounter (HOSPITAL_COMMUNITY): Payer: Self-pay

## 2019-08-28 ENCOUNTER — Emergency Department (HOSPITAL_COMMUNITY): Payer: Medicare Other

## 2019-08-28 DIAGNOSIS — I130 Hypertensive heart and chronic kidney disease with heart failure and stage 1 through stage 4 chronic kidney disease, or unspecified chronic kidney disease: Secondary | ICD-10-CM | POA: Diagnosis not present

## 2019-08-28 DIAGNOSIS — N183 Chronic kidney disease, stage 3 unspecified: Secondary | ICD-10-CM | POA: Insufficient documentation

## 2019-08-28 DIAGNOSIS — R1084 Generalized abdominal pain: Secondary | ICD-10-CM | POA: Insufficient documentation

## 2019-08-28 DIAGNOSIS — I509 Heart failure, unspecified: Secondary | ICD-10-CM | POA: Diagnosis not present

## 2019-08-28 DIAGNOSIS — K7460 Unspecified cirrhosis of liver: Secondary | ICD-10-CM | POA: Diagnosis not present

## 2019-08-28 DIAGNOSIS — R079 Chest pain, unspecified: Secondary | ICD-10-CM | POA: Diagnosis not present

## 2019-08-28 DIAGNOSIS — Z79899 Other long term (current) drug therapy: Secondary | ICD-10-CM | POA: Insufficient documentation

## 2019-08-28 DIAGNOSIS — R0789 Other chest pain: Secondary | ICD-10-CM | POA: Diagnosis not present

## 2019-08-28 LAB — URINALYSIS, ROUTINE W REFLEX MICROSCOPIC
Bilirubin Urine: NEGATIVE
Glucose, UA: NEGATIVE mg/dL
Hgb urine dipstick: NEGATIVE
Ketones, ur: NEGATIVE mg/dL
Leukocytes,Ua: NEGATIVE
Nitrite: NEGATIVE
Protein, ur: NEGATIVE mg/dL
Specific Gravity, Urine: 1.016 (ref 1.005–1.030)
pH: 7 (ref 5.0–8.0)

## 2019-08-28 LAB — COMPREHENSIVE METABOLIC PANEL
ALT: 21 U/L (ref 0–44)
AST: 34 U/L (ref 15–41)
Albumin: 3.2 g/dL — ABNORMAL LOW (ref 3.5–5.0)
Alkaline Phosphatase: 65 U/L (ref 38–126)
Anion gap: 8 (ref 5–15)
BUN: 17 mg/dL (ref 8–23)
CO2: 23 mmol/L (ref 22–32)
Calcium: 9.9 mg/dL (ref 8.9–10.3)
Chloride: 106 mmol/L (ref 98–111)
Creatinine, Ser: 1.89 mg/dL — ABNORMAL HIGH (ref 0.61–1.24)
GFR calc Af Amer: 39 mL/min — ABNORMAL LOW (ref >60)
GFR calc non Af Amer: 33 mL/min — ABNORMAL LOW (ref >60)
Glucose, Bld: 91 mg/dL (ref 70–99)
Potassium: 4.6 mmol/L (ref 3.5–5.1)
Sodium: 137 mmol/L (ref 135–145)
Total Bilirubin: 1.2 mg/dL (ref 0.3–1.2)
Total Protein: 7.5 g/dL (ref 6.5–8.1)

## 2019-08-28 LAB — TROPONIN I (HIGH SENSITIVITY)
Troponin I (High Sensitivity): 9 ng/L (ref <18)
Troponin I (High Sensitivity): 8 ng/L (ref <18)

## 2019-08-28 LAB — CBC
HCT: 36.8 % — ABNORMAL LOW (ref 39.0–52.0)
Hemoglobin: 12.1 g/dL — ABNORMAL LOW (ref 13.0–17.0)
MCH: 35.1 pg — ABNORMAL HIGH (ref 26.0–34.0)
MCHC: 32.9 g/dL (ref 30.0–36.0)
MCV: 106.7 fL — ABNORMAL HIGH (ref 80.0–100.0)
Platelets: 167 10*3/uL (ref 150–400)
RBC: 3.45 MIL/uL — ABNORMAL LOW (ref 4.22–5.81)
RDW: 14.5 % (ref 11.5–15.5)
WBC: 9.3 10*3/uL (ref 4.0–10.5)
nRBC: 0 % (ref 0.0–0.2)

## 2019-08-28 LAB — LIPASE, BLOOD: Lipase: 36 U/L (ref 11–51)

## 2019-08-28 LAB — POC OCCULT BLOOD, ED: Fecal Occult Bld: NEGATIVE

## 2019-08-28 NOTE — ED Provider Notes (Signed)
[facility]ERGENCY DEPARTMENT Provider Note   CSN: [id number] Arrival date & time: 08/28/19  0740     History Chief Complaint  Patient presents with  . Chest Pain  . Abdominal Pain    Donald Moreno is a 79 y.o. male.  79 year old male with past medical history of CHF, chronic kidney disease, cirrhosis, a flutter, hypertension, GI bleed presents with complaint of generalized abdominal pain from suprapubic area to epigastric area, described as "a pain,", constant since 4 AM when it woke him from his sleep.  Patient reports normal bowel movement this morning, denies dysuria, denies nausea, vomiting, chest pain or shortness of breath. Patient states that his cirrhosis is well managed, has had prior SBP, prior paracentesis however has not needed this treatment recently.  Patient is not anticoagulated.  No other complaints or concerns today.        Past Medical History:  Diagnosis Date  . Acute congestive heart failure ([place])   . Arthritis    "all over"  . Asthma   . Blood in stool 04/11/2017  . Chronic kidney disease    "I see a kidney dr @ [facility]" (10/11/2016)  . Chronic lower back pain    "turned a truck over a long time ago" (10/11/2016)  . Dysrhythmia    bradycardia with occassional junctional rhythm  . ETOH abuse   . Gout   . Hypertension   . Inguinal hernia 10/28/2014   S/P inguinal hernia repair with 4L as ascites removed 03/30/17  . Liver cirrhosis ([place]) 2015  . Macrocytosis   . Pneumonia    "long long time ago" (10/11/2016)  . Spontaneous bacterial peritonitis ([place]) 07/01/2015    Patient Active Problem List   Diagnosis Date Noted  . History of anemia due to CKD 02/20/2018  . Secondary hyperparathyroidism of renal origin ([place]) 02/20/2018  . Corn or callus 07/11/2017  . Macrocytic anemia 04/20/2017  . Cirrhosis of liver with ascites ([place])   . Congestive heart failure ([place])   . Hematuria 01/30/2017  . Alcoholic cirrhosis of liver  with ascites ([place])   . Typical atrial flutter ([place])   . CKD (chronic kidney disease) stage 3, GFR 30-59 ml/min (HCC) 05/27/2015  . At risk for decreased bone density 03/04/2015  . Bradycardia 07/08/2013  . Advanced directives, counseling/discussion 01/03/2011  . Asthma 01/29/2010  . Gout 07/13/2006  . HYPERTENSION, BENIGN SYSTEMIC 07/13/2006  . Arthritis 07/13/2006    Past Surgical History:  Procedure Laterality Date  . INGUINAL HERNIA REPAIR Bilateral 03/30/2017   Procedure: OPEN BILATERAL INGUINAL HERNIA REPAIR WITH MESH;  Surgeon: Kinsinger, Arta Bruce, MD;  Location: [facility];  Service: General;  Laterality: Bilateral;  GENERAL COMBINED WITH REGIONAL FOR POST OP PAIN   . INSERTION OF MESH N/A 10/16/2016   Procedure: INSERTION OF MESH;  Surgeon: Kinsinger, Arta Bruce, MD;  Location: [facility];  Service: General;  Laterality: N/A;  . INSERTION OF MESH Bilateral 03/30/2017   Procedure: INSERTION OF MESH;  Surgeon: Kieth Brightly Arta Bruce, MD;  Location: [facility];  Service: General;  Laterality: Bilateral;  GENERAL COMBINED WITH REGIONAL FOR POST OP PAIN   . IR PARACENTESIS  08/19/2016  . IR PARACENTESIS  08/26/2016  . IR PARACENTESIS  09/02/2016  . IR PARACENTESIS  09/09/2016  . IR PARACENTESIS  09/16/2016  . IR PARACENTESIS  10/03/2016  . IR PARACENTESIS  10/11/2016  . IR RADIOLOGIST EVAL & MGMT  09/29/2016  . IR RADIOLOGIST EVAL & MGMT  12/21/2016  .  IR RADIOLOGIST EVAL & MGMT  01/23/2018  . IR RADIOLOGIST EVAL & MGMT  02/19/2019  . IR TIPS  10/11/2016  . RADIOLOGY WITH ANESTHESIA N/A 10/11/2016   Procedure: TIPS;  Surgeon: Arne Cleveland, MD;  Location: [facility];  Service: Radiology;  Laterality: N/A;  . UMBILICAL HERNIA REPAIR N/A 10/16/2016   Procedure: HERNIA REPAIR UMBILICAL ADULT;  Surgeon: Kinsinger, Arta Bruce, MD;  Location: [facility];  Service: General;  Laterality: N/A;       Family History  Problem Relation Age of Onset  . Other Mother        died when pt was only 69 - unknown cause.  . Other  Father        deceased.  . Asthma Sister   . Heart disease Sister        multiple stents.  . Asthma Brother   . Hypertension Brother   . Asthma Son   . Asthma Brother   . Hypertension Brother     Social History   Tobacco Use  . Smoking status: Never Smoker  . Smokeless tobacco: Current User    Types: Snuff, Chew  Substance Use Topics  . Alcohol use: No    Comment: quit drinking "over 2 yr ago" (10/11/2016)  . Drug use: No    Home Medications Prior to Admission medications   Medication Sig Start Date End Date Taking? Authorizing Provider  albuterol (PROAIR HFA) 108 (90 Base) MCG/ACT inhaler Inhale 2 puffs into the lungs every 4 (four) hours as needed for wheezing. 05/24/18  Yes Enid Derry, [place], DO  allopurinol (ZYLOPRIM) 100 MG tablet TAKE 1/2 TABLET BY MOUTH EVERY DAY Patient taking differently: Take 50 mg by mouth daily.  03/18/19  Yes Enid Derry, [place], DO  bisoprolol (ZEBETA) 10 MG tablet TAKE 1 TABLET BY MOUTH EVERY DAY Patient taking differently: Take 10 mg by mouth daily.  02/25/19  Yes Sherran Needs, NP  Cholecalciferol (VITAMIN D-3) 5000 units TABS Take 5,000 Units daily by mouth.    Yes [provider]  ferrous sulfate 325 (65 FE) MG tablet Take 1 tablet (325 mg total) by mouth 2 (two) times daily with a meal. Patient taking differently: Take 325 mg by mouth every Monday, Wednesday, and Friday.  10/21/14  Yes Gottschalk, Ashly M, DO  lactulose (CHRONULAC) 10 GM/15ML solution TAKE 15 MLS BY MOUTH 2 (TWO) TIMES DAILY AS NEEDED (NEED TO ENSURE 2 SOFT BM IN A DAY.). Patient taking differently: Take 10 g by mouth 2 (two) times daily as needed for mild constipation. (NEED TO ENSURE 2 SOFT BM IN A DAY.). 05/06/19  Yes Enid Derry, [place], DO  LORazepam (ATIVAN) 0.5 MG tablet Take 1 tablet (0.5 mg total) by mouth 3 (three) times daily as needed ([place]). 07/20/19  Yes Daleen Bo, MD  montelukast (SINGULAIR) 10 MG tablet TAKE 1 TABLET BY MOUTH EVERYDAY AT BEDTIME Patient  taking differently: Take 10 mg by mouth at bedtime.  08/05/19  Yes Enid Derry, [place], DO  Nutritional Supplements (ENSURE COMPLETE SHAKE) LIQD Take 1 Container by mouth daily. Patient taking differently: Take 1 Container 2 (two) times daily by mouth.  03/04/15  Yes Phelps, Jazma Y, DO  WIXELA INHUB 500-50 MCG/DOSE AEPB INHALE 1 PUFF BY MOUTH TWICE A DAY Patient taking differently: Inhale 1 puff into the lungs in the morning and at bedtime.  06/24/19  Yes Enid Derry, [place], DO  FLUZONE HIGH-DOSE QUADRIVALENT 0.7 ML SUSY  02/27/19   [provider]  spironolactone (ALDACTONE) 25 MG tablet TAKE  1 TABLET BY MOUTH EVERY DAY Patient not taking: Reported on 08/28/2019 08/08/19   Shirley, [place], DO  Fluticasone-Salmeterol [facility] INHUB) 500-50 MCG/DOSE AEPB Inhale 1 puff into the lungs 2 (two) times daily. 10/27/17   Shirley, [place], DO  furosemide (LASIX) 40 MG tablet TAKE 1 TAB 3 TIMES A DAY FOR WEIGHT >168LB 2 TIMES A DAY FOR WEIGHT 163-168LB ONCE DAILY FOR WEIGHT <163LB 12/24/18 07/20/19  Enid Derry, [place], DO  montelukast (SINGULAIR) 10 MG tablet TAKE 1 TABLET BY MOUTH EVERYDAY AT BEDTIME 07/27/18   Enid Derry, [place], DO  spironolactone (ALDACTONE) 25 MG tablet TAKE 1 TABLET BY MOUTH EVERY DAY 10/17/18   Shirley, [place], DO    Allergies    Patient has no known allergies.  Review of Systems   Review of Systems  Constitutional: Negative for chills, diaphoresis and fever.  Respiratory: Negative for shortness of breath.   Cardiovascular: Negative for chest pain.  Gastrointestinal: Positive for abdominal pain. Negative for blood in stool, constipation, diarrhea, nausea and vomiting.  Genitourinary: Negative for difficulty urinating, dysuria, frequency and hematuria.  Musculoskeletal: Positive for back pain.  Skin: Negative for rash and wound.  Allergic/Immunologic: Negative for immunocompromised state.  Neurological: Negative for dizziness, weakness and light-headedness.  Hematological: Does not  bruise/bleed easily.  Psychiatric/Behavioral: Negative for confusion.  All other systems reviewed and are negative.   Physical Exam Updated Vital Signs BP 140/72 (BP Location: Right Arm)   Pulse (!) 55   Temp 98.5 F (36.9 C) (Oral)   Resp 18   Ht 5\' 11"  (1.803 m)   Wt 73.5 kg   SpO2 98%   BMI 22.59 kg/m   Physical Exam Vitals and nursing note reviewed. Exam conducted with a chaperone present.  Constitutional:      General: He is not in acute distress.    Appearance: He is well-developed. He is not diaphoretic.  HENT:     Head: Normocephalic and atraumatic.  Cardiovascular:     Rate and Rhythm: Normal rate and regular rhythm.     Heart sounds: Normal heart sounds.  Pulmonary:     Effort: Pulmonary effort is normal.     Breath sounds: Normal breath sounds.  Chest:     Chest wall: No tenderness.  Abdominal:     Palpations: Abdomen is soft. There is no fluid wave or mass.     Tenderness: There is generalized abdominal tenderness. There is no guarding or rebound.  Genitourinary:    Rectum: Guaiac result negative. No tenderness or external hemorrhoid.  Musculoskeletal:       Back:     Right lower leg: No edema.     Left lower leg: No edema.  Skin:    General: Skin is dry.  Neurological:     Mental Status: He is alert and oriented to person, place, and time.  Psychiatric:        Behavior: Behavior normal.     ED Results / Procedures / Treatments   Labs (all labs ordered are listed, but only abnormal results are displayed) Labs Reviewed  COMPREHENSIVE METABOLIC PANEL - Abnormal; Notable for the following components:      Result Value   Creatinine, Ser 1.89 (*)    Albumin 3.2 (*)    GFR calc non Af Amer 33 (*)    GFR calc Af Amer 39 (*)    All other components within normal limits  CBC - Abnormal; Notable for the following components:   RBC 3.45 (*)    Hemoglobin 12.1 (*)  HCT 36.8 (*)    MCV 106.7 (*)    MCH 35.1 (*)    All other components within  normal limits  LIPASE, BLOOD  URINALYSIS, ROUTINE W REFLEX MICROSCOPIC  POC OCCULT BLOOD, ED  TROPONIN I (HIGH SENSITIVITY)  TROPONIN I (HIGH SENSITIVITY)    EKG EKG Interpretation  Date/Time:  Wednesday August 28 2019 07:46:44 EDT Ventricular Rate:  58 PR Interval:    QRS Duration: 102 QT Interval:  444 QTC Calculation: 435 R Axis:   44 Text Interpretation: Atrial flutter with 4:1 A-V conduction Abnormal ECG Confirmed by Virgel Manifold [phone]) on 08/28/2019 8:58:48 AM   Radiology CT Abdomen Pelvis Wo Contrast  Result Date: 08/28/2019 CLINICAL DATA:  Abdominal pain. EXAM: CT ABDOMEN AND PELVIS WITHOUT CONTRAST TECHNIQUE: Multidetector CT imaging of the abdomen and pelvis was performed following the standard protocol without IV contrast. COMPARISON:  01/23/2017 CT abdomen pelvis. 10/19/2018 right upper quadrant ultrasound. 03/06/2018 complete abdominal ultrasound. FINDINGS: Please note that lack of intravenous contrast limits evaluation of the viscera and vasculature. Lower chest: Bibasilar subsegmental atelectasis. Trace bilateral pleural effusions. Partially imaged multilevel left rib posttraumatic deformities. Bilateral gynecomastia. Hepatobiliary: Sequela of cirrhosis and TIPS. Stent patency cannot be assessed secondary to lack of intravenous contrast. Unchanged 1.1 cm hilar cystic density (3:17). Posterior right hepatic hypodense region (3:18) is more conspicuous than prior exam. This may reflect geographic steatosis versus postprocedural sequela. Cholelithiasis. Diffusely edematous, prominent appearance of the gallbladder wall is likely secondary to underlying liver inflammation. Pancreas: No focal lesions or pancreatic ductal dilatation. No surrounding inflammation. Spleen: No focal lesions. Measures 10.1 cm, within normal limits of size (6:94). Adrenals/Urinary Tract: Adrenal glands are unremarkable. Malrotated left kidney. 1.1 cm left inferior pole hypodensity (3:29), unchanged and  likely reflects a cyst. No renal calculi or hydronephrosis. Bladder is unremarkable. Stomach/Bowel: Sequela of interval bilateral inguinal hernia repair without evidence of recurrence. Stomach is within normal limits. Appendix is not well visualized. No secondary right lower quadrant signs to suggest appendicitis. No evidence of obstruction or bowel inflammation. Colonic diverticulosis. Trace perihepatic free fluid. Vascular/Lymphatic: Vasculature is within normal limits for patient's age. Atherosclerotic calcifications involving the abdominal aorta and its branch vessels, unchanged. Prominent porta hepatic nodes are unchanged and likely reactive. Reproductive: Enlarged prostate measures 4.3 cm in transaxial dimension (3:80). Unremarkable seminal vesicles. Other: External soft tissues are unremarkable. Musculoskeletal: Multilevel spondylosis. Severe bilateral hip degenerative changes with left greater than right chronic subchondral deformities concerning for osteonecrosis. IMPRESSION: No acute abdominopelvic process. Cirrhosis status post TIPS. Please note that lack of intravenous contrast limits evaluation for stent patency. Decreased abdominal ascites. Reactive gallbladder inflammation likely secondary to underlying liver disease. Bilateral femoral head osteonecrosis, grossly unchanged. Additional chronic and incidental findings as detailed above. Electronically Signed   By: Primitivo Gauze M.D.   On: 08/28/2019 12:06   DG Chest 2 View  Result Date: 08/28/2019 CLINICAL DATA:  Chest pain this morning. History of asthma and hypertension. EXAM: CHEST - 2 VIEW COMPARISON:  Radiographs 07/20/2019 and 04/11/2017. FINDINGS: The heart size and mediastinal contours are stable. There is stable chronic lung disease with central airway and fissural thickening bilaterally. No edema, confluent airspace opacity, pleural effusion or pneumothorax. Old rib fractures are noted bilaterally. No acute osseous findings.  IMPRESSION: Stable chronic lung disease. No acute findings. Electronically Signed   By: Richardean Sale M.D.   On: 08/28/2019 08:33    Procedures Procedures (including critical care time)  Medications Ordered in ED Medications - No data to  display  ED Course  I have reviewed the triage vital signs and the nursing notes.  Pertinent labs & imaging results that were available during my care of the patient were reviewed by me and considered in my medical decision making (see chart for details).  Clinical Course as of Aug 28 1347  Wed Aug 28, 2019  1346 79yo male with generalized abdominal pain since waking this morning. On exam, well appearing male with mild generalized abdominal tenderness.  Review of labs, decrease in hgb from prior of 15 1 month ago, 12,1 today- followed with hemoccult which is negative. UA WNL, lipase normal. CMP with mild increase in Cr, known CKD. Troponin ordered in triage initially 9, 8 on repeat, doubt ACS.  CT abdomen/pelvis with PO contrast, no acute findings.  On recheck, patient is feeling better, plan to recheck with PCP, return to ER for worsening or concerning symptoms.   [LM]    Clinical Course User Index [LM] Donald Moreno   MDM Rules/Calculators/A&P                      Final Clinical Impression(s) / ED Diagnoses Final diagnoses:  Generalized abdominal pain    Rx / DC Orders ED Discharge Orders    None       Donald Moreno 08/28/19 1349    Virgel Manifold, MD 08/31/19 [phone]

## 2019-08-28 NOTE — ED Notes (Signed)
Pt. Finished drinking Omnipaque at this time. Imaging dept. Notified.

## 2019-08-28 NOTE — ED Triage Notes (Signed)
Pt reports chest pain/abd pain that started this morning, no other symptoms. Pt a.o

## 2019-09-25 ENCOUNTER — Encounter: Payer: Self-pay | Admitting: Family Medicine

## 2019-09-25 ENCOUNTER — Other Ambulatory Visit: Payer: Self-pay

## 2019-09-25 ENCOUNTER — Ambulatory Visit (INDEPENDENT_AMBULATORY_CARE_PROVIDER_SITE_OTHER): Payer: Medicare Other | Admitting: Family Medicine

## 2019-09-25 VITALS — BP 144/68 | HR 60 | Ht 71.0 in | Wt 173.0 lb

## 2019-09-25 DIAGNOSIS — I5022 Chronic systolic (congestive) heart failure: Secondary | ICD-10-CM

## 2019-09-25 DIAGNOSIS — N1831 Chronic kidney disease, stage 3a: Secondary | ICD-10-CM

## 2019-09-25 DIAGNOSIS — I10 Essential (primary) hypertension: Secondary | ICD-10-CM | POA: Diagnosis not present

## 2019-09-25 DIAGNOSIS — K7030 Alcoholic cirrhosis of liver without ascites: Secondary | ICD-10-CM | POA: Diagnosis not present

## 2019-09-25 MED ORDER — FERROUS SULFATE 325 (65 FE) MG PO TABS: 325.0000 mg | ORAL_TABLET | ORAL | 1 refills | Status: DC

## 2019-09-25 NOTE — Patient Instructions (Addendum)
Thank you for coming to see me today. It was a pleasure! Today we talked about:   Please try melatonin before sleep to see if this helps you sleep better.   Please schedule an appointment with your cardiologist and your kidney doctor.    Please follow-up with me in 3 months or sooner as needed.  If you have any questions or concerns, please do not hesitate to call the office at [phone].  Take Care,   [place] Jakevion Arney, DO

## 2019-09-25 NOTE — Progress Notes (Signed)
   SUBJECTIVE:   CHIEF COMPLAINT / HPI:   H/o cirrhosis s/p TIPS 2018: Patient reports feeling great. Still uses lactulose prn and has no ascites or SOB.   HFpEF  Bradycardia: - last seen in the office on 07/27/2018 andcontinuingspironolactone 25mg  daily -has not followed up with cardiology recently - patient reports still having normal urine output, states he goes all the time and at night especially but he would not like to be sen by a urologist - also states that he is feeling great and walking everyday like he usually does - low salt dietreinforced, compression stockingsmost days -HQI:ONGEXB scrotal swelling, uses 2 pillows at night which is his normal, and denies any leg swelling or SOB  Hypertension: - Medications: bisoprolol 10 mg daily, spironolactone 25 mg daily - Compliance: Yes - Checking BP at home: No - Denies any SOB, CP, vision changes, LE edema, medication SEs, or symptoms of hypotension - Diet: Patient attempts to have a low-sodium diet - Exercise: Patient walks as much as he can daily  Asthma well-controlled on wixela and albuterol prn  PERTINENT  PMH / PSH: CKD stage III (followed by [facility]), alcohol induced cirrhosis of the liver, gout, asthma, HTN, OA, atrial flutter, HFpEF, bradycardia  OBJECTIVE:  BP (!) 144/68   Pulse 60   Ht 5\' 11"  (1.803 m)   Wt 173 lb (78.5 kg)   SpO2 98%   BMI 24.13 kg/m   General: NAD, pleasant Neck: Supple Cardiovascular: RRR, no m/r/g, no LE edema Respiratory: CTA BL, normal work of breathing Psych: AOx3, appropriate affect  ASSESSMENT/PLAN:   No problem-specific Assessment & Plan notes found for this encounter.    [place] Allysen Lazo, DO PGY-3, [facility]

## 2019-09-26 LAB — COMPREHENSIVE METABOLIC PANEL
ALT: 19 IU/L (ref 0–44)
AST: 30 IU/L (ref 0–40)
Albumin/Globulin Ratio: 1.1 — ABNORMAL LOW (ref 1.2–2.2)
Albumin: 3.8 g/dL (ref 3.7–4.7)
Alkaline Phosphatase: 83 IU/L (ref 39–117)
BUN/Creatinine Ratio: 11 (ref 10–24)
BUN: 15 mg/dL (ref 8–27)
Bilirubin Total: 0.9 mg/dL (ref 0.0–1.2)
CO2: 20 mmol/L (ref 20–29)
Calcium: 9.6 mg/dL (ref 8.6–10.2)
Chloride: 107 mmol/L — ABNORMAL HIGH (ref 96–106)
Creatinine, Ser: 1.41 mg/dL — ABNORMAL HIGH (ref 0.76–1.27)
GFR calc Af Amer: 55 mL/min/{1.73_m2} — ABNORMAL LOW (ref >59)
GFR calc non Af Amer: 48 mL/min/{1.73_m2} — ABNORMAL LOW (ref >59)
Globulin, Total: 3.4 g/dL (ref 1.5–4.5)
Glucose: 85 mg/dL (ref 65–99)
Potassium: 4.5 mmol/L (ref 3.5–5.2)
Sodium: 141 mmol/L (ref 134–144)
Total Protein: 7.2 g/dL (ref 6.0–8.5)

## 2019-09-30 ENCOUNTER — Telehealth: Payer: Self-pay | Admitting: Family Medicine

## 2019-09-30 ENCOUNTER — Telehealth: Payer: Self-pay | Admitting: Cardiovascular Disease

## 2019-09-30 NOTE — Telephone Encounter (Signed)
Guerry Minors, RN, from [employer] stating the patient is enrolled in their CHF program. She states she has been getting weights and vitals of the patient and his BP has been creeping up. She states it has been ranging in 160-170's HR staying 55. She state he is not having any symptoms.

## 2019-09-30 NOTE — Telephone Encounter (Signed)
Returned call to [place] with [facility] no answer.[place].

## 2019-09-30 NOTE — Telephone Encounter (Signed)
Called and informed patient of normal results.  We will continue with current plan.  Again encouraged him to follow-up with cardiology and nephrology.  [place] Sean Macwilliams, DO PGY-3, [facility]

## 2019-10-01 NOTE — Assessment & Plan Note (Signed)
Patients renal function improved. Encouraged to follow up with [facility] as he is overdue for check up

## 2019-10-01 NOTE — Assessment & Plan Note (Signed)
CMP and CBC check today. No ascites and patient reports feeling well and using lactulose prn.

## 2019-10-01 NOTE — Assessment & Plan Note (Signed)
Euvolemic today and CMP wnl for patient. Patient encouraged to follow up with cardiology.

## 2019-10-04 NOTE — Telephone Encounter (Signed)
Left message for pt to call.

## 2019-10-07 NOTE — Telephone Encounter (Signed)
Follow up     Pt is returning call  Pt says he has been feeling fine    Please call

## 2019-10-07 NOTE — Telephone Encounter (Signed)
I spoke to the patient who said that over the weekend he felt fine, with no more headaches.    He checked his BP, but did not record, "but thought" they were good.    He will monitor over the next 3 days and call Friday with a report.

## 2019-10-08 ENCOUNTER — Other Ambulatory Visit: Payer: Self-pay | Admitting: Gastroenterology

## 2019-10-08 DIAGNOSIS — K7460 Unspecified cirrhosis of liver: Secondary | ICD-10-CM

## 2019-10-11 ENCOUNTER — Ambulatory Visit
Admission: RE | Admit: 2019-10-11 | Discharge: 2019-10-11 | Disposition: A | Discharge status: Home / Self Care | Payer: Medicare Other | Source: Ambulatory Visit | Attending: Gastroenterology | Admitting: Gastroenterology

## 2019-10-11 DIAGNOSIS — K7460 Unspecified cirrhosis of liver: Secondary | ICD-10-CM | POA: Diagnosis not present

## 2019-10-19 ENCOUNTER — Other Ambulatory Visit: Payer: Self-pay | Admitting: Family Medicine

## 2019-11-08 ENCOUNTER — Other Ambulatory Visit: Payer: Self-pay | Admitting: Family Medicine

## 2019-11-08 ENCOUNTER — Telehealth: Payer: Self-pay | Admitting: Cardiovascular Disease

## 2019-11-08 DIAGNOSIS — I509 Heart failure, unspecified: Secondary | ICD-10-CM

## 2019-11-08 MED ORDER — SPIRONOLACTONE 50 MG PO TABS: 50.0000 mg | ORAL_TABLET | Freq: Every day | ORAL | 3 refills | Status: DC

## 2019-11-08 NOTE — Addendum Note (Signed)
Addended by: Cristopher Estimable on: 11/08/2019 11:17 AM   Modules accepted: Orders

## 2019-11-08 NOTE — Telephone Encounter (Signed)
Please increase spironolactone to 50 mg daily. It will take a couple of weeks for  the full effect of the medication on BP. Please send info on BP in 2 weeks.

## 2019-11-08 NOTE — Telephone Encounter (Signed)
Spoke with pt, aware to double the spironolactone. New script sent to the pharmacy and he will call with bp readings in 2 weeks.

## 2019-11-08 NOTE — Telephone Encounter (Signed)
Incoming call from [employer] with [facility]. She reports that the pt is in their care management program and she has been following him for a few weeks. She reports that his BP usually runs in the 160s/170s over 60s. She reports that she had a video visit with the pt on Wednesday and noticed he was not placing his BP cuff in the right area so she instructed him on the correct way to take it and after doing so his BP was even higher: 183/70  186/76  175/73  172/65   RN reports that the pt has no symptoms worse than usual. She reports she tried to keep him on the line while calling us but that he was going for his morning walk and we could call and leave him a message.   Will send this message to Dr.C to review  Pt is on bisoprolol 10mg 

## 2019-11-08 NOTE — Telephone Encounter (Signed)
Left message for pt to call.

## 2019-11-19 ENCOUNTER — Ambulatory Visit (INDEPENDENT_AMBULATORY_CARE_PROVIDER_SITE_OTHER): Payer: Medicare Other | Admitting: Family Medicine

## 2019-11-19 ENCOUNTER — Encounter: Payer: Self-pay | Admitting: Family Medicine

## 2019-11-19 ENCOUNTER — Other Ambulatory Visit: Payer: Self-pay

## 2019-11-19 DIAGNOSIS — H9319 Tinnitus, unspecified ear: Secondary | ICD-10-CM | POA: Insufficient documentation

## 2019-11-19 DIAGNOSIS — H9311 Tinnitus, right ear: Secondary | ICD-10-CM | POA: Diagnosis not present

## 2019-11-19 NOTE — Assessment & Plan Note (Signed)
HEENT exam benign. Normal hearing screening done during this visit. Menniers' less likely given no vertigo or hearing impairment. ?? Venous hum. Red flag signs discussed. Return to PCP for reassessment in 1-2 weeks if there is no improvement or sooner if he develops abnormal gait, pain, hearing loss or discharge. Her verbalized understanding and agreed with the plan.

## 2019-11-19 NOTE — Progress Notes (Signed)
    SUBJECTIVE:   CHIEF COMPLAINT / HPI:   HPI Tinnitus:C/O constant whooshing sound of his right ears x 1 weeks. Had it years ago and then it went away. No ear pain, no discharge. It does not disturb her from sleeping, but sometimes, disturb him from working. No hearing loss. He denies dizziness or abnormal gait. No head injury. He feels well otherwise.  Medication review: He came in with the following medications to be discarded:Amiodarone  200 mg tab, allopurinol 300 mg tab, lasix 40 mg tab. Per the patient, this was discontinued by his PCP and Cardiologist a while back.  PERTINENT  PMH / PSH: PMX reviewed.  OBJECTIVE:   BP 140/78   Pulse 60   Ht 5\' 11"  (1.803 m)   Wt 173 lb (78.5 kg)   SpO2 98%   BMI 24.13 kg/m   Physical Exam Vitals and nursing note reviewed.  HENT:     Head: Normocephalic and atraumatic.     Right Ear: Hearing, tympanic membrane, ear canal and external ear normal. No drainage or tenderness. No middle ear effusion. There is no impacted cerumen. Tympanic membrane is not injected or bulging.     Left Ear: Hearing, tympanic membrane, ear canal and external ear normal. No drainage or tenderness.  No middle ear effusion. There is no impacted cerumen. Tympanic membrane is not injected or bulging.  Cardiovascular:     Rate and Rhythm: Normal rate and regular rhythm.     Heart sounds: Normal heart sounds. No murmur heard.   Pulmonary:     Effort: Pulmonary effort is normal. No respiratory distress.     Breath sounds: Normal breath sounds. No wheezing or rhonchi.  Neurological:     Comments: Slow but steady gait      ASSESSMENT/PLAN:   Tinnitus HEENT exam benign. Normal hearing screening done during this visit. Menniers' less likely given no vertigo or hearing impairment. ?? Venous hum. Red flag signs discussed. Return to PCP for reassessment in 1-2 weeks if there is no improvement or sooner if he develops abnormal gait, pain, hearing loss or  discharge. Her verbalized understanding and agreed with the plan.      Andrena Mews, MD [place]

## 2019-11-19 NOTE — Patient Instructions (Signed)
It was nice seeing you today. Sorry about your tinnitus. Please let us monitor for a week more or so. If this persist, we can further investigate. Come in sooner if you experience ear pain or hearing loss or loss of balance.  Tinnitus Tinnitus refers to hearing a sound when there is no actual source for that sound. This is often described as ringing in the ears. However, people with this condition may hear a variety of noises, in one ear or in both ears. The sounds of tinnitus can be soft, loud, or somewhere in between. Tinnitus can last for a few seconds or can be constant for days. It may go away without treatment and come back at various times. When tinnitus is constant or happens often, it can lead to other problems, such as trouble sleeping and trouble concentrating. Almost everyone experiences tinnitus at some point. Tinnitus that is long-lasting (chronic) or comes back often (recurs) may require medical attention. What are the causes? The cause of tinnitus is often not known. In some cases, it can result from:  Exposure to loud noises from machinery, music, or other sources.  An object (foreign body) stuck in the ear.  Earwax buildup.  Drinking alcohol or caffeine.  Taking certain medicines.  Age-related hearing loss. It may also be caused by medical conditions such as:  Ear or sinus infections.  High blood pressure.  Heart diseases.  Anemia.  Allergies.  Meniere's disease.  Thyroid problems.  Tumors.  A weak, bulging blood vessel (aneurysm) near the ear. What are the signs or symptoms? The main symptom of tinnitus is hearing a sound when there is no source for that sound. It may sound like:  Buzzing.  Roaring.  Ringing.  Blowing air.  Hissing.  Whistling.  Sizzling.  Humming.  Running water.  A musical note.  Tapping. Symptoms may affect only one ear (unilateral) or both ears (bilateral). How is this diagnosed? Tinnitus is diagnosed based on  your symptoms, your medical history, and a physical exam. Your health care provider may do a thorough hearing test (audiologic exam) if your tinnitus:  Is unilateral.  Causes hearing difficulties.  Lasts 6 months or longer. You may work with a health care provider who specializes in hearing disorders (audiologist). You may be asked questions about your symptoms and how they affect your daily life. You may have other tests done, such as:  CT scan.  MRI.  An imaging test of how blood flows through your blood vessels (angiogram). How is this treated? Treating an underlying medical condition can sometimes make tinnitus go away. If your tinnitus continues, other treatments may include:  Medicines.  Therapy and counseling to help you manage the stress of living with tinnitus.  Sound generators to mask the tinnitus. These include: ? Tabletop sound machines that play relaxing sounds to help you fall asleep. ? Wearable devices that fit in your ear and play sounds or music. ? Acoustic neural stimulation. This involves using headphones to listen to music that contains an auditory signal. Over time, listening to this signal may change some pathways in your brain and make you less sensitive to tinnitus. This treatment is used for very severe cases when no other treatment is working.  Using hearing aids or cochlear implants if your tinnitus is related to hearing loss. Hearing aids are worn in the outer ear. Cochlear implants are surgically placed in the inner ear. Follow these instructions at home: Managing symptoms      When possible,  avoid being in loud places and being exposed to loud sounds.  Wear hearing protection, such as earplugs, when you are exposed to loud noises.  Use a white noise machine, a humidifier, or other devices to mask the sound of tinnitus.  Practice techniques for reducing stress, such as meditation, yoga, or deep breathing. Work with your health care provider if you  need help with managing stress.  Sleep with your head slightly raised. This may reduce the impact of tinnitus. General instructions  Do not use stimulants, such as nicotine, alcohol, or caffeine. Talk with your health care provider about other stimulants to avoid. Stimulants are substances that can make you feel alert and attentive by increasing certain activities in the body (such as heart rate and blood pressure). These substances may make tinnitus worse.  Take over-the-counter and prescription medicines only as told by your health care provider.  Try to get plenty of sleep each night.  Keep all follow-up visits as told by your health care provider. This is important. Contact a health care provider if:  Your tinnitus continues for 3 weeks or longer without stopping.  You develop sudden hearing loss.  Your symptoms get worse or do not get better with home care.  You feel you are not able to manage the stress of living with tinnitus. Get help right away if:  You develop tinnitus after a head injury.  You have tinnitus along with any of the following: ? Dizziness. ? Loss of balance. ? Nausea and vomiting. ? Sudden, severe headache. These symptoms may represent a serious problem that is an emergency. Do not wait to see if the symptoms will go away. Get medical help right away. Call your local emergency services (911 in the [place] Do not drive yourself to the hospital. Summary  Tinnitus refers to hearing a sound when there is no actual source for that sound. This is often described as ringing in the ears.  Symptoms may affect only one ear (unilateral) or both ears (bilateral).  Use a white noise machine, a humidifier, or other devices to mask the sound of tinnitus.  Do not use stimulants, such as nicotine, alcohol, or caffeine. Talk with your health care provider about other stimulants to avoid. These substances may make tinnitus worse. This information is not intended to replace  advice given to you by your health care provider. Make sure you discuss any questions you have with your health care provider. Document Revised: 11/14/2018 Document Reviewed: 02/09/2017 Elsevier Patient Education  2020 [employer].

## 2019-11-29 ENCOUNTER — Telehealth: Payer: Self-pay

## 2019-11-29 NOTE — Telephone Encounter (Signed)
Minette Brine, RN with [facility] calls nurse line to report elevated BP readings on patient. Reports blood pressures in the 180's/ 70s, with last BP 193/86. States that patient has been asymptomatic, however, continues to experience shaking in hands. Minette Brine advised patient of ED precautions and to bring BP cuff to office visit on Monday to see if BP readings correlate between home cuff and manual BP in office.    Attempted to call patient, no answer, left HIPPA compliant VM for patient to return call to office.   To PCP  [name], RN

## 2019-11-30 NOTE — Telephone Encounter (Signed)
Patient has appt scheduled with me on Monday, he has already received ED precautions. Will follow-up at appointment.

## 2019-12-02 ENCOUNTER — Other Ambulatory Visit: Payer: Self-pay

## 2019-12-02 ENCOUNTER — Ambulatory Visit (INDEPENDENT_AMBULATORY_CARE_PROVIDER_SITE_OTHER): Payer: Medicare Other | Admitting: Family Medicine

## 2019-12-02 ENCOUNTER — Encounter: Payer: Self-pay | Admitting: Family Medicine

## 2019-12-02 VITALS — BP 194/77 | HR 57 | Wt 173.0 lb

## 2019-12-02 DIAGNOSIS — H9311 Tinnitus, right ear: Secondary | ICD-10-CM | POA: Diagnosis not present

## 2019-12-02 DIAGNOSIS — I10 Essential (primary) hypertension: Secondary | ICD-10-CM

## 2019-12-02 NOTE — Progress Notes (Signed)
    SUBJECTIVE:   CHIEF COMPLAINT / HPI:   HTN: -Blood pressure elevated on Friday 7/16 with home reading at 193/86 with no symptoms and was given ED precautions. Today his BP was 178/72 (he brought in his home BP cuff which measured him at 194/77). Prior to July, his BP was controlled in the 140s/90s. He denies vision changes or HA.  Tinnitus of R Ear: Patient reports a constant high-pitch whooshing sound in his right ear for the last month. He was seen 2 weeks ago for this and his hearing screen at that time was normal. He denies waking from sleep, dizziness, abnormal gait, worsening with exercise, worsening with position change.   PERTINENT  PMH / PSH: PMX reviewed  OBJECTIVE:   BP (!) 194/77 Comment: right arm, automatic cuff  Pulse (!) 57   Wt 173 lb (78.5 kg)   SpO2 93%   BMI 24.13 kg/m    Physical Exam: Vitals and nursing note reviewed.  HENT:     Head: Normocephalic and atraumatic.     Right Ear: Hearing, tympanic membrane, ear canal and external ear normal. No drainage or tenderness. No middle ear effusion. No impacted cerumen. Tympanic membrane is not injected or bulging.     Left Ear: Hearing, tympanic membrane, ear canal and external ear normal. No drainage or tenderness.  No middle ear effusion. No impacted cerumen. Tympanic membrane is not injected or bulging.   Cardiovascular:     Rate and Rhythm: Normal rate and regular rhythm.     Heart sounds: Normal heart sounds. No murmur heard.   Pulmonary:     Effort: Pulmonary effort is normal. No respiratory distress.     Breath sounds: Normal breath sounds. Mild expiratory wheeze heard in all lung fields.   Neurological:     Comments: Slow but steady gait   ASSESSMENT/PLAN:   HYPERTENSION, BENIGN SYSTEMIC Blood pressure 178/72 at current visit. (3 days ago home reading was 194/77, though his home cuff is measuring higher than in the office). Previously well controlled before July, referring patient to ambulatory  blood pressure monitoring with Dr. Valentina Lucks.   Tinnitus Tinnitus of the R ear for >1 month with no improvement. Not worsening with position change or exercise. Previous audiology test in office was normal. Referring to ENT. Considering venous hum (though not worse with exercise or position change). Meniere's disease (not common in his age group).     Donald Moreno, [place]

## 2019-12-02 NOTE — Assessment & Plan Note (Signed)
Tinnitus of the R ear for >1 month with no improvement. Not worsening with position change or exercise. Previous audiology test in office was normal. Referring to ENT. Considering venous hum (though not worse with exercise or position change). Meniere's disease (not common in his age group).

## 2019-12-02 NOTE — Assessment & Plan Note (Signed)
Blood pressure 178/72 at current visit. (3 days ago home reading was 194/77, though his home cuff is measuring higher than in the office). Previously well controlled before July, referring patient to ambulatory blood pressure monitoring with Dr. Valentina Lucks.

## 2019-12-02 NOTE — Patient Instructions (Signed)
It was great seeing you today! We discussed the following:  High blood pressure: I am referring you to the ambulatory blood pressure monitoring clinic. Please continue to check your blood pressure at least 3 times a week and keep a log. Keep taking your blood pressure medications.  Tinnitus: For the ringing in your right ear I am sending a referral to ENT to make sure that everything is normal. Your hearing test in office 2 weeks ago was normal.  If you have any questions at all, please do not hesitate to contact the clinic.

## 2019-12-05 ENCOUNTER — Ambulatory Visit (INDEPENDENT_AMBULATORY_CARE_PROVIDER_SITE_OTHER): Payer: Medicare Other | Admitting: Pharmacist

## 2019-12-05 ENCOUNTER — Other Ambulatory Visit: Payer: Self-pay

## 2019-12-05 ENCOUNTER — Encounter: Payer: Self-pay | Admitting: Pharmacist

## 2019-12-05 VITALS — BP 156/85 | HR 64 | Ht 69.13 in | Wt 172.4 lb

## 2019-12-05 DIAGNOSIS — I5022 Chronic systolic (congestive) heart failure: Secondary | ICD-10-CM

## 2019-12-05 DIAGNOSIS — I10 Essential (primary) hypertension: Secondary | ICD-10-CM

## 2019-12-05 MED ORDER — DAPAGLIFLOZIN PROPANEDIOL 10 MG PO TABS: 10.0000 mg | ORAL_TABLET | Freq: Every day | ORAL | 3 refills | Status: DC

## 2019-12-05 NOTE — Assessment & Plan Note (Signed)
Chronic Heart Failure: Patient is adherent with his medications and denies edema and shortness of breath. Clinic BP 156/85 and home BP readings (140-170s/50s) are elevated. Will add additional guideline directed heart failure therapy.  - Start Farxiga 10 mg daily - Continue Spironolactone 50 mg daily - Continue Bisoprolol 10 mg daily - Check BMET, lipid panel, and uric acid at next follow-up visit. - Encouraged patient to schedule an appointment with his cardiologist.

## 2019-12-05 NOTE — Progress Notes (Signed)
S:    Patient arrives in good spirits. Presents to the clinic for ambulatory blood pressure evaluation.   Patient was referred and last seen by Primary Care Provider, Dr. Oleh Genin on 12/02/19.  Patient reports adherence and no side effects with his medications. He states its been over a year since using Lasix and is not experiencing edema or shortness of breath. He checks his blood pressure 3 times a week and SBP readings range from 145-171 mmHg and DBP around 55 mmHg. Additionally, he started drinking with he was 42-29 years old, but stopped drinking hard liquor about 30 years ago and stopped drinking beer 8 years ago (~6 pack/day). He currently drinks coffee, clear soda, and water. He has been chewing tobacco "Peggye Form" since he was 79 years old. He has tried Chantix in the past, however restarted using chewing tobacco.   Medication compliance is reported to be good.  Current BP Medications include: Bisoprolol 10 mg daily, Spironolactone 50 mg daily  Antihypertensives tried in the past include: Lisinopril 40 mg daily  Dietary habits include: drinks coffee, clear soda, and water  O:   Physical Exam Musculoskeletal:        General: No swelling.     Right lower leg: No edema.     Left lower leg: No edema.  Neurological:     Mental Status: He is alert and oriented to person, place, and time.  Psychiatric:        Mood and Affect: Mood normal.    Review of Systems  Respiratory: Negative.   All other systems reviewed and are negative.  Last 3 Office BP readings: BP Readings from Last 3 Encounters:  12/05/19 (!) 156/85  12/02/19 (!) 194/77  11/19/19 140/78   Clinical Atherosclerotic Cardiovascular Disease (ASCVD): No  The ASCVD Risk score Mikey Bussing DC Jr., et al., 2013) failed to calculate for the following reasons:   Cannot find a previous HDL lab   Cannot find a previous total cholesterol lab  Basic Metabolic Panel    Component Value Date/Time   NA 141 09/25/2019 1034   K 4.5  09/25/2019 1034   CL 107 (H) 09/25/2019 1034   CO2 20 09/25/2019 1034   GLUCOSE 85 09/25/2019 1034   GLUCOSE 91 08/28/2019 0753   BUN 15 09/25/2019 1034   CREATININE 1.41 (H) 09/25/2019 1034   CREATININE 1.19 (H) 09/29/2016 1701   CALCIUM 9.6 09/25/2019 1034   GFRNONAA 48 (L) 09/25/2019 1034   GFRNONAA 60 09/29/2016 1701   GFRAA 55 (L) 09/25/2019 1034   GFRAA 69 09/29/2016 1701    Renal function: CrCl cannot be calculated (Patient's most recent lab result is older than the maximum 21 days allowed.).  Today's Office Blood Pressure (BP) reading: 156/85 mmHg (manual reading)  PHQ-9 Score: 0  A/P: Chronic Heart Failure: Patient is adherent with his medications and denies edema and shortness of breath. Clinic BP 156/85 and home BP readings (140-170s/50s) are elevated. Will add additional guideline directed heart failure therapy.  - Start Farxiga 10 mg daily - Continue Spironolactone 50 mg daily - Continue Bisoprolol 10 mg daily - Check BMET, lipid panel, and uric acid at next follow-up visit. - Encouraged patient to schedule an appointment with his cardiologist.  Hypertension: BP above goal < 130/80 mmHg. Patient appears adherent with BP medications and reports home SBP readings are elevated. Will plan for a 24-hour ambulatory BP monitor to assess readings at home after initiation of Farxiga - Continue Spironolactone 50 mg daily - Continue  Bisoprolol 10 mg daily - Continue to monitor BP at home, write down readings and bring to next follow-up visit - Follow-up in 2-weeks for 24-hour ambulatory blood pressure monitoring   Results reviewed and written information provided.  Total time in face-to-face counseling 30 minutes.   F/U Clinic Visit with pharmacy in 2 weeks.  Patient seen with Laurey Arrow, PharmD Candidate and Lorel Monaco, PharmD, PGY2 Pharmacy Resident.

## 2019-12-05 NOTE — Assessment & Plan Note (Signed)
Hypertension: BP above goal < 130/80 mmHg. Patient appears adherent with BP medications and reports home SBP readings are elevated. Will plan for a 24-hour ambulatory BP monitor to assess readings at home after initiation of Farxiga - Continue Spironolactone 50 mg daily - Continue Bisoprolol 10 mg daily - Continue to monitor BP at home, write down readings and bring to next follow-up visit - Follow-up in 2-weeks for 24-hour ambulatory blood pressure monitoring

## 2019-12-05 NOTE — Patient Instructions (Addendum)
Nice meeting you today!  START Farxiga 10 mg - 1 tablet once daily.  Continue your current medications.  Continue checking your blood pressure at home, write down the readings and bring to your next visit.  We would like to see you in 2 weeks.

## 2019-12-06 NOTE — Progress Notes (Signed)
Reviewed: I agree with Dr. Koval's documentation and management. 

## 2019-12-11 DIAGNOSIS — K7460 Unspecified cirrhosis of liver: Secondary | ICD-10-CM | POA: Diagnosis not present

## 2019-12-11 DIAGNOSIS — Z1211 Encounter for screening for malignant neoplasm of colon: Secondary | ICD-10-CM | POA: Diagnosis not present

## 2019-12-13 ENCOUNTER — Other Ambulatory Visit: Payer: Self-pay

## 2019-12-13 ENCOUNTER — Ambulatory Visit (INDEPENDENT_AMBULATORY_CARE_PROVIDER_SITE_OTHER): Payer: Medicare Other | Admitting: Podiatry

## 2019-12-13 DIAGNOSIS — N183 Chronic kidney disease, stage 3 unspecified: Secondary | ICD-10-CM

## 2019-12-13 DIAGNOSIS — L84 Corns and callosities: Secondary | ICD-10-CM | POA: Diagnosis not present

## 2019-12-13 DIAGNOSIS — B351 Tinea unguium: Secondary | ICD-10-CM | POA: Diagnosis not present

## 2019-12-13 NOTE — Progress Notes (Signed)
  Subjective:  Patient ID: Donald Moreno, male    DOB: 04-03-1941,  MRN: [id number]  Chief Complaint  Patient presents with  . Nail Problem    Onychomycosis.  [name]    Corns   79 y.o. male presents with the above complaint. History confirmed with patient.   Objective:  Physical Exam: warm, good capillary refill, nail exam onychomycosis of the toenails, no trophic changes or ulcerative lesions. DP pulses palpable, PT pulses palpable and protective sensation intact Left Foot: Hammertoes, corn left 5th toe Right Foot: Hammertoes  No images are attached to the encounter.  Assessment:   1. Onychomycosis   2. Callus   3. Stage 3 chronic kidney disease, unspecified whether stage 3a or 3b CKD      Plan:  Patient was evaluated and treated and all questions answered.  Onychomycosis, Callus, CKD -Patient is diabetic with a qualifying condition for at risk foot care.  Procedure: Nail Debridement Rationale: Patient meets criteria for routine foot care due to CKD Type of Debridement: manual, sharp debridement. Instrumentation: Nail nipper, rotary burr. Number of Nails: 10   Procedure: Paring of Lesion Rationale: painful hyperkeratotic lesion Type of Debridement: manual, sharp debridement. Instrumentation: 312 blade Number of Lesions: 1  Return in about 3 months (around 03/14/2020) for Routine Foot Care - CKD.

## 2019-12-19 ENCOUNTER — Ambulatory Visit (INDEPENDENT_AMBULATORY_CARE_PROVIDER_SITE_OTHER): Payer: Medicare Other | Admitting: Pharmacist

## 2019-12-19 ENCOUNTER — Ambulatory Visit: Payer: Medicare Other | Admitting: Family Medicine

## 2019-12-19 ENCOUNTER — Encounter: Payer: Self-pay | Admitting: Pharmacist

## 2019-12-19 ENCOUNTER — Other Ambulatory Visit: Payer: Self-pay

## 2019-12-19 VITALS — BP 176/79 | HR 57 | Ht 68.98 in | Wt 172.2 lb

## 2019-12-19 DIAGNOSIS — I10 Essential (primary) hypertension: Secondary | ICD-10-CM

## 2019-12-19 DIAGNOSIS — I5022 Chronic systolic (congestive) heart failure: Secondary | ICD-10-CM | POA: Diagnosis not present

## 2019-12-19 DIAGNOSIS — D539 Nutritional anemia, unspecified: Secondary | ICD-10-CM

## 2019-12-19 NOTE — Progress Notes (Signed)
S:    Patient arrives in good spirits. Presents to the clinic for ambulatory blood pressure evaluation.   Patient was referred and last seen by Primary Care Provider, Dr. Oleh Genin on 12/02/19 and last seen by Dr. Valentina Lucks on 12/05/19.   Diagnosed with Hypertension in the year 2008.  Medication compliance is reported to be excellent - pill bottles appear to verify adherence.  Discussed procedure for wearing the monitor and gave patient written instructions. Monitor was placed on non-dominant arm with instructions to return in the morning.   On day 2 f/u, patient returned to clinic in good spirits. Patient reported having a generally normal day yesterday with activities that included walking, and watching television, however did disclose that a family member caused stress for him in the morning hours. While he only had about ~2 hours of sleep, patient also had an additional period of restful awakening around 7PM while watching television. Patient reported compliance with device.   Current BP Medications include: Bisoprolol 10 mg daily, Spironolactone 50 mg daily Current HF Medications include: Dapagliflozin 10 mg daily  Antihypertensives tried in the past include: Lisinopril 40 mg daily  Dietary habits include: drinks coffee, clear soda, and water  O:   Physical Exam Vitals reviewed.  Constitutional:      Appearance: He is normal weight.  Pulmonary:     Effort: Pulmonary effort is normal.  Neurological:     Mental Status: He is alert.  Psychiatric:        Mood and Affect: Mood normal.      Review of Systems  Neurological: Positive for tremors.  All other systems reviewed and are negative.   Last 3 Office BP readings: BP Readings from Last 3 Encounters:  12/19/19 (!) 176/79  12/05/19 (!) 156/85  12/02/19 (!) 194/77   Clinical Atherosclerotic Cardiovascular Disease (ASCVD): No  The ASCVD Risk score Mikey Bussing DC Jr., et al., 2013) failed to calculate for the following reasons:    Cannot find a previous HDL lab   Cannot find a previous total cholesterol lab  Basic Metabolic Panel    Component Value Date/Time   NA 141 09/25/2019 1034   K 4.5 09/25/2019 1034   CL 107 (H) 09/25/2019 1034   CO2 20 09/25/2019 1034   GLUCOSE 85 09/25/2019 1034   GLUCOSE 91 08/28/2019 0753   BUN 15 09/25/2019 1034   CREATININE 1.41 (H) 09/25/2019 1034   CREATININE 1.19 (H) 09/29/2016 1701   CALCIUM 9.6 09/25/2019 1034   GFRNONAA 48 (L) 09/25/2019 1034   GFRNONAA 60 09/29/2016 1701   GFRAA 55 (L) 09/25/2019 1034   GFRAA 69 09/29/2016 1701      ABPM Study Data: Arm Placement left arm   Overall Mean 24hr BP:   141/60 mmHg HR: 55    Daytime Mean BP:  141/61 mmHg HR: 55   Nighttime Mean BP:  140/60 mmHg HR: 55   Dipping Pattern: No.  Sys:   0.9%   Dia: 1.5%   [normal dipping ~10-20%]  Non-hypertensive ABPM thresholds: daytime BP <125/75 mmHg, sleeptime BP <120/70 mmHg    A/P: History of hypertension since 2008, found to have generally well controlled systolic pressures throughout the day, despite morning highs of which may be contributed to walking and/or family stressors. Further, diastolic pressures are generally low throughout the entire evaluation (all 24 hours). Therefore, continue current regimen, and do not add any antihypertensives at this time.    Labs obtained today included those requested by Gastroenterology / Patient  requested with prescription.   CMET, INR, CBC with diff, Lipid Panel Labs are all consistent with previous values.  I will send to PCP.  Gastroenterologist does not appear to be in [facility]L/EPIC.  I am not sure of Gastroenterologist name.     Results reviewed and written information provided.  Total time in face-to-face counseling 20 minutes.   F/U Clinic Visit with PCP,  Dr. Oleh Genin.  Patient seen with Mickeal Skinner, PharmD Candidate and Rebbeca Paul, PharmD - PGY-1 Resident.

## 2019-12-19 NOTE — Patient Instructions (Signed)
Nice seeing you today!  Wearing the Blood Pressure Monitor  The cuff will inflate every 20 minutes during the day and every 30 minutes while you sleep.  Your blood pressure readings will NOT display after cuff inflation  Fill out the blood pressure-activity diary during the day, especially during activities that may affect your reading -- such as exercise, stress, walking, taking your blood pressure medications  Important things to know:  Avoid taking the monitor off for the next 24 hours, unless it causes you discomfort or pain.  Do NOT get the monitor wet and do NOT dry to clean the monitor with any cleaning products.  Do NOT put the monitor on anyone else's arm.  When the cuff inflates, avoid excess movement. Let the cuffed arm hang loosely, slightly away from the body. Avoid flexing the muscles or moving the hand/fingers.  When you go to sleep, make sure that the hose is not kinked.  Remember to fill out the blood pressure activity diary.  If you experience severe pain or unusual pain (not associated with getting your blood pressure checked), remove the monitor.  Troubleshooting:  Code  Troubleshooting   1  Check cuff position, tighten cuff   2, 3  Remain still during reading   4, 87  Check air hose connections and make sure cuff is tight   85, 89  Check hose connections and make tubing is not crimped   86  Push START/STOP to restart reading   88, 91  Retry by pushing START/STOP   90  Replace batteries. If problem persists, remove monitor and bring back to   clinic at follow up   97, 98, 99  Service required - Remove monitor and bring back to clinic at follow up   Blood Pressure Activity Diary Time Lying down/ Sleeping Walking/ Exercise Stressed/ Angry Headache/ Pain Dizzy  9 AM       10 AM       11 AM       12 PM       1 PM       2 PM       Time Lying down/ Sleeping Walking/ Exercise Stressed/ Angry Headache/ Pain Dizzy  3 PM       4 PM        5 PM       6 PM        7 PM       8 PM       Time Lying down/ Sleeping Walking/ Exercise Stressed/ Angry Headache/ Pain Dizzy  9 PM       10 PM       11 PM       12 AM       1 AM       2 AM       3 AM       Time Lying down/ Sleeping Walking/ Exercise Stressed/ Angry Headache/ Pain Dizzy  4 AM       5 AM       6 AM       7 AM       8 AM       9 AM       10 AM        Time you woke up: _________                  Time you went to sleep:__________    Come back tomorrow  at 8:30 to have the monitor removed  Call the [facility] if you have any questions before then [phone])

## 2019-12-20 LAB — COMPREHENSIVE METABOLIC PANEL
ALT: 18 IU/L (ref 0–44)
AST: 31 IU/L (ref 0–40)
Albumin/Globulin Ratio: 1 — ABNORMAL LOW (ref 1.2–2.2)
Albumin: 3.8 g/dL (ref 3.7–4.7)
Alkaline Phosphatase: 92 IU/L (ref 48–121)
BUN/Creatinine Ratio: 13 (ref 10–24)
BUN: 21 mg/dL (ref 8–27)
Bilirubin Total: 0.9 mg/dL (ref 0.0–1.2)
CO2: 22 mmol/L (ref 20–29)
Calcium: 10.2 mg/dL (ref 8.6–10.2)
Chloride: 104 mmol/L (ref 96–106)
Creatinine, Ser: 1.58 mg/dL — ABNORMAL HIGH (ref 0.76–1.27)
GFR calc Af Amer: 48 mL/min/{1.73_m2} — ABNORMAL LOW (ref >59)
GFR calc non Af Amer: 42 mL/min/{1.73_m2} — ABNORMAL LOW (ref >59)
Globulin, Total: 3.7 g/dL (ref 1.5–4.5)
Glucose: 93 mg/dL (ref 65–99)
Potassium: 4 mmol/L (ref 3.5–5.2)
Sodium: 138 mmol/L (ref 134–144)
Total Protein: 7.5 g/dL (ref 6.0–8.5)

## 2019-12-20 LAB — CBC WITH DIFFERENTIAL/PLATELET
Basophils Absolute: 0 10*3/uL (ref 0.0–0.2)
Basos: 1 %
EOS (ABSOLUTE): 0.2 10*3/uL (ref 0.0–0.4)
Eos: 3 %
Hematocrit: 35.6 % — ABNORMAL LOW (ref 37.5–51.0)
Hemoglobin: 12.6 g/dL — ABNORMAL LOW (ref 13.0–17.7)
Immature Grans (Abs): 0 10*3/uL (ref 0.0–0.1)
Immature Granulocytes: 0 %
Lymphocytes Absolute: 1.7 10*3/uL (ref 0.7–3.1)
Lymphs: 27 %
MCH: 35.9 pg — ABNORMAL HIGH (ref 26.6–33.0)
MCHC: 35.4 g/dL (ref 31.5–35.7)
MCV: 101 fL — ABNORMAL HIGH (ref 79–97)
Monocytes Absolute: 0.6 10*3/uL (ref 0.1–0.9)
Monocytes: 9 %
Neutrophils Absolute: 3.7 10*3/uL (ref 1.4–7.0)
Neutrophils: 60 %
Platelets: 130 10*3/uL — ABNORMAL LOW (ref 150–450)
RBC: 3.51 x10E6/uL — ABNORMAL LOW (ref 4.14–5.80)
RDW: 12.3 % (ref 11.6–15.4)
WBC: 6.2 10*3/uL (ref 3.4–10.8)

## 2019-12-20 LAB — LIPID PANEL
Chol/HDL Ratio: 2 ratio (ref 0.0–5.0)
Cholesterol, Total: 110 mg/dL (ref 100–199)
HDL: 56 mg/dL (ref >39)
LDL Chol Calc (NIH): 43 mg/dL (ref 0–99)
Triglycerides: 41 mg/dL (ref 0–149)
VLDL Cholesterol Cal: 11 mg/dL (ref 5–40)

## 2019-12-20 LAB — PROTIME-INR
INR: 1.2 (ref 0.9–1.2)
Prothrombin Time: 12.2 s — ABNORMAL HIGH (ref 9.1–12.0)

## 2019-12-20 NOTE — Assessment & Plan Note (Signed)
History of hypertension since 2008, found to have generally well controlled systolic pressures throughout the day, despite morning highs of which may be contributed to walking and/or family stressors. Further, diastolic pressures are generally low throughout the entire evaluation (all 24 hours). Therefore, continue current regimen, and do not add any antihypertensives at this time.

## 2019-12-20 NOTE — Progress Notes (Signed)
Reviewed: I agree Dr. Graylin Shiver documentation and management

## 2019-12-23 ENCOUNTER — Other Ambulatory Visit: Payer: Self-pay

## 2019-12-23 DIAGNOSIS — M109 Gout, unspecified: Secondary | ICD-10-CM

## 2019-12-23 MED ORDER — ALLOPURINOL 100 MG PO TABS: 50.0000 mg | ORAL_TABLET | Freq: Every day | ORAL | 0 refills | Status: DC

## 2020-01-02 ENCOUNTER — Other Ambulatory Visit: Payer: Self-pay

## 2020-01-02 DIAGNOSIS — D539 Nutritional anemia, unspecified: Secondary | ICD-10-CM

## 2020-01-02 MED ORDER — FERROUS SULFATE 325 (65 FE) MG PO TABS: 325.0000 mg | ORAL_TABLET | ORAL | 1 refills | Status: DC

## 2020-01-08 ENCOUNTER — Telehealth: Payer: Self-pay

## 2020-01-08 NOTE — Telephone Encounter (Signed)
Tiffany, [facility] RN, LVM on nurse line reporting increased high bp in patient. Tiffany reports his diastolic is WNL, however systolic constantly 237 or greater. Tiffany reports good compliance with BP meds and denies any symptoms. I attempted to call her back to get more information, however had to LVM. Please advise on recommendations.

## 2020-01-09 NOTE — Telephone Encounter (Signed)
RN contacted and informed of below. RN informed me she has his alert set to 170 and above. If the alert goes off will call to schedule. Patient has been in the 150s with no symptoms and doing ok. Patient stated he was told at last visit will monitor as long as no symptoms are present.

## 2020-01-09 NOTE — Telephone Encounter (Signed)
If patient is asymptomatic, can monitor. If persistently over 150, please schedule visit to be seen. Please let Savanna RN know.   Dorris Singh, MD  Family Medicine Teaching Service

## 2020-01-13 ENCOUNTER — Telehealth: Payer: Self-pay | Admitting: *Deleted

## 2020-01-13 NOTE — Telephone Encounter (Signed)
Left a message for the patient to call back. His appointment at 2:40 on 8/31 with Dr. Sallyanne Kuster will need to be moved to 12 (noon) on the same day or rescheduled.

## 2020-01-13 NOTE — Telephone Encounter (Signed)
Patient made aware and appointment moved to 12 tomorrow.

## 2020-01-14 ENCOUNTER — Ambulatory Visit (INDEPENDENT_AMBULATORY_CARE_PROVIDER_SITE_OTHER): Payer: Medicare Other | Admitting: Cardiovascular Disease

## 2020-01-14 ENCOUNTER — Encounter: Payer: Self-pay | Admitting: Cardiovascular Disease

## 2020-01-14 ENCOUNTER — Other Ambulatory Visit: Payer: Self-pay

## 2020-01-14 VITALS — BP 132/80 | HR 64 | Ht 70.0 in | Wt 165.2 lb

## 2020-01-14 DIAGNOSIS — K7030 Alcoholic cirrhosis of liver without ascites: Secondary | ICD-10-CM

## 2020-01-14 DIAGNOSIS — I5032 Chronic diastolic (congestive) heart failure: Secondary | ICD-10-CM

## 2020-01-14 DIAGNOSIS — I483 Typical atrial flutter: Secondary | ICD-10-CM

## 2020-01-14 DIAGNOSIS — N1831 Chronic kidney disease, stage 3a: Secondary | ICD-10-CM | POA: Diagnosis not present

## 2020-01-14 NOTE — Patient Instructions (Signed)

## 2020-01-14 NOTE — Progress Notes (Signed)
Cardiology office note    Date:  01/14/2020   ID:  Donald Moreno, DOB 1941/01/12, MRN [id number]   PCP:  [name], DO  Cardiologist:  Sanda Klein, MD  Electrophysiologist:  None   Evaluation Performed:  Follow-Up Visit  Chief Complaint:  AFlutter, CHF  History of Present Illness:    Donald Moreno is a 79 y.o. male with recurrent persistent atrial flutter with rapid ventricular response,  congestive heart failure with preserved left ventricular systolic function during periods of RVR, markedly improved after arrhythmia control.  He was initially managed with amiodarone but this had to be stopped due to junctional rhythm.  He has advanced chronic liver disease (cirrhosis, history of multiple paracentesis for massive ascites, improved after TIPS).  Because of his liver problems with spontaneously elevated prothrombin time, he is not anticoagulated.  He had COVID-19 infection (just 1 week after receiving vaccine in March) and had some problems with tachycardia during that time, without development of full-blown heart failure.  Otherwise he has been completely unaware of the arrhythmia.  He is in atrial flutter with controlled ventricular rate today.  On event monitor in January 2020, he had persistent atrial flutter with rates of 38-129 bpm, average 74 bpm and occasional pauses over 3 seconds.  He has lower extremity edema if he does not wear his compression stockings, but has not had ascites.  He denies orthopnea, PND or exertional dyspnea.  He walks every single day, usually twice a day.  He denies dizziness or syncope.  He has not had falls injuries or serious bleeding problems.  His most recent INR was 1.2.  He has not had overt problems with encephalopathy.  He lives with his niece who is a Dietitian.   Reactive bilateral nodes of Past Medical History:  Diagnosis Date  . Acute congestive heart failure ([place])   . Arthritis    "all over"  . Asthma   . Blood in  stool 04/11/2017  . Chronic kidney disease    "I see a kidney dr @ [facility]" (10/11/2016)  . Chronic lower back pain    "turned a truck over a long time ago" (10/11/2016)  . Dysrhythmia    bradycardia with occassional junctional rhythm  . ETOH abuse   . Gout   . Hypertension   . Inguinal hernia 10/28/2014   S/P inguinal hernia repair with 4L as ascites removed 03/30/17  . Liver cirrhosis ([place]) 2015  . Macrocytosis   . Pneumonia    "long long time ago" (10/11/2016)  . Spontaneous bacterial peritonitis ([name]) 07/01/2015   Past Surgical History:  Procedure Laterality Date  . INGUINAL HERNIA REPAIR Bilateral 03/30/2017   Procedure: OPEN BILATERAL INGUINAL HERNIA REPAIR WITH MESH;  Surgeon: Kinsinger, Arta Bruce, MD;  Location: [facility];  Service: General;  Laterality: Bilateral;  GENERAL COMBINED WITH REGIONAL FOR POST OP PAIN   . INSERTION OF MESH N/A 10/16/2016   Procedure: INSERTION OF MESH;  Surgeon: Kinsinger, Arta Bruce, MD;  Location: [place];  Service: General;  Laterality: N/A;  . INSERTION OF MESH Bilateral 03/30/2017   Procedure: INSERTION OF MESH;  Surgeon: Kieth Brightly Arta Bruce, MD;  Location: [facility];  Service: General;  Laterality: Bilateral;  GENERAL COMBINED WITH REGIONAL FOR POST OP PAIN   . IR PARACENTESIS  08/19/2016  . IR PARACENTESIS  08/26/2016  . IR PARACENTESIS  09/02/2016  . IR PARACENTESIS  09/09/2016  . IR PARACENTESIS  09/16/2016  . IR PARACENTESIS  10/03/2016  .  IR PARACENTESIS  10/11/2016  . IR RADIOLOGIST EVAL & MGMT  09/29/2016  . IR RADIOLOGIST EVAL & MGMT  12/21/2016  . IR RADIOLOGIST EVAL & MGMT  01/23/2018  . IR RADIOLOGIST EVAL & MGMT  02/19/2019  . IR TIPS  10/11/2016  . RADIOLOGY WITH ANESTHESIA N/A 10/11/2016   Procedure: TIPS;  Surgeon: Arne Cleveland, MD;  Location: [place];  Service: Radiology;  Laterality: N/A;  . UMBILICAL HERNIA REPAIR N/A 10/16/2016   Procedure: HERNIA REPAIR UMBILICAL ADULT;  Surgeon: Kinsinger, Arta Bruce, MD;  Location: [place];  Service:  General;  Laterality: N/A;     Current Meds  Medication Sig  . albuterol (PROAIR HFA) 108 (90 Base) MCG/ACT inhaler Inhale 2 puffs into the lungs every 4 (four) hours as needed for wheezing.  Marland Kitchen allopurinol (ZYLOPRIM) 100 MG tablet Take 0.5 tablets (50 mg total) by mouth daily.  . bisoprolol (ZEBETA) 10 MG tablet TAKE 1 TABLET BY MOUTH EVERY DAY (Patient taking differently: Take 10 mg by mouth daily. )  . Cholecalciferol (VITAMIN D-3) 5000 units TABS Take 5,000 Units daily by mouth.   . dapagliflozin propanediol (FARXIGA) 10 MG TABS tablet Take 1 tablet (10 mg total) by mouth daily.  . ferrous sulfate 325 (65 FE) MG tablet Take 1 tablet (325 mg total) by mouth every other day.  . lactulose (CHRONULAC) 10 GM/15ML solution Take 15 mLs (10 g total) by mouth 2 (two) times daily as needed for mild constipation. (NEED TO ENSURE 2 SOFT BM IN A DAY.).  Marland Kitchen montelukast (SINGULAIR) 10 MG tablet TAKE 1 TABLET BY MOUTH EVERYDAY AT BEDTIME (Patient taking differently: Take 10 mg by mouth at bedtime. )  . Nutritional Supplements (ENSURE COMPLETE SHAKE) LIQD Take 1 Container by mouth daily. (Patient taking differently: Take 1 Container 2 (two) times daily by mouth. )  . spironolactone (ALDACTONE) 50 MG tablet Take 1 tablet (50 mg total) by mouth daily.  Grant Ruts INHUB 500-50 MCG/DOSE AEPB INHALE 1 PUFF BY MOUTH TWICE A DAY (Patient taking differently: Inhale 1 puff into the lungs in the morning and at bedtime. )     Allergies:   Patient has no known allergies.   Social History   Tobacco Use  . Smoking status: Never Smoker  . Smokeless tobacco: Current User    Types: Snuff, Chew  . Tobacco comment: Started chewing tobacco at 79 years old  Vaping Use  . Vaping Use: Never used  Substance Use Topics  . Alcohol use: No    Comment: quit drinking "over 2 yr ago" (10/11/2016)  . Drug use: No     Family Hx: The patient's family history includes Asthma in his brother, brother, sister, and son; Heart disease  in his sister; Hypertension in his brother and brother; Other in his father and mother.  ROS:   Please see the history of present illness.    All other systems are reviewed and are negative.  Prior CV studies:   The following studies were reviewed today: AFib clinic notes  Labs/Other Tests and Data Reviewed:    EKG: Ordered today, shows atrial flutter with variable AV block, occasional aberrancy, LVH by voltage criteria only, normal QTC  Recent Labs: 12/19/2019: ALT 18; BUN 21; Creatinine, Ser 1.58; Hemoglobin 12.6; Platelets 130; Potassium 4.0; Sodium 138   Recent Lipid Panel Lab Results  Component Value Date/Time   CHOL 110 12/19/2019 10:25 AM   TRIG 41 12/19/2019 10:25 AM   HDL 56 12/19/2019 10:25 AM  CHOLHDL 2.0 12/19/2019 10:25 AM   CHOLHDL 2.0 11/11/2011 10:30 AM   LDLCALC 43 12/19/2019 10:25 AM    Wt Readings from Last 3 Encounters:  01/14/20 165 lb 3.2 oz (74.9 kg)  12/19/19 172 lb 3.2 oz (78.1 kg)  12/05/19 172 lb 6.4 oz (78.2 kg)     Objective:    Vital Signs:  BP 132/80   Pulse 64   Ht 5\' 10"  (1.778 m)   Wt 165 lb 3.2 oz (74.9 kg)   SpO2 100%   BMI 23.70 kg/m     General: Alert, oriented x3, no distress, lean and looks fit Head: no evidence of trauma, PERRL, EOMI, no exophtalmos or lid lag, no myxedema, no xanthelasma; normal ears, nose and oropharynx Neck: normal jugular venous pulsations and no hepatojugular reflux; brisk carotid pulses without delay and no carotid bruits Chest: clear to auscultation, no signs of consolidation by percussion or palpation, normal fremitus, symmetrical and full respiratory excursions Cardiovascular: normal position and quality of the apical impulse, irregular rhythm, normal first and second heart sounds, no murmurs, rubs or gallops Abdomen: no tenderness or distention, no masses by palpation, no abnormal pulsatility or arterial bruits, normal bowel sounds, no hepatosplenomegaly Extremities: no clubbing, cyanosis or edema;  2+ radial, ulnar and brachial pulses bilaterally; 2+ right femoral, posterior tibial and dorsalis pedis pulses; 2+ left femoral, posterior tibial and dorsalis pedis pulses; no subclavian or femoral bruits Neurological: grossly nonfocal Psych: Normal mood and affect    ASSESSMENT & PLAN:    1. Typical atrial flutter ([place])   2. Chronic diastolic congestive heart failure ([place])   3. Alcoholic cirrhosis of liver without ascites ([facility])   4. Stage 3a chronic kidney disease      1. A Flutter: The arrhythmia is asymptomatic and well rate controlled.  We have not recommended anticoagulation, due to advanced chronic liver disease.  Although his prothrombin time has improved but is not completely normal and I think is still safer to not prescribe anticoagulation. CHADSVasc 2 (age; CHF was rate related)  2. CHF: Clinically euvolemic.  On spironolactone.  Has not had ascites since the TIPS procedure.  As long as his rhythm/rate is well controlled he does not have any complaints of heart failure. 3. Cirrhosis: No overt evidence of decompensation.  Excellent quality of life.  Has been abstinent from alcohol. 4. CKD 3: Most recent creatinine on August 5 was 1.58 (baseline is 1.4-1.6).  COVID-19 Education: The signs and symptoms of COVID-19 were discussed with the patient and how to seek care for testing (follow up with PCP or arrange E-visit).  The importance of social distancing was discussed today.  Time:   Today, I have spent 19 minutes with the patient with telehealth technology discussing the above problems.     Medication Adjustments/Labs and Tests Ordered: Current medicines are reviewed at length with the patient today.  Concerns regarding medicines are outlined above.   Tests Ordered: Orders Placed This Encounter  Procedures  . EKG 12-Lead    Medication Changes: No orders of the defined types were placed in this encounter.   Disposition:  Follow up 12 months  Signed, Sanda Klein,  MD  01/14/2020 1:03 PM    [place]

## 2020-01-23 ENCOUNTER — Emergency Department (HOSPITAL_COMMUNITY): Payer: Medicare Other

## 2020-01-23 ENCOUNTER — Encounter (HOSPITAL_COMMUNITY): Payer: Self-pay

## 2020-01-23 ENCOUNTER — Ambulatory Visit (INDEPENDENT_AMBULATORY_CARE_PROVIDER_SITE_OTHER): Payer: Medicare Other | Admitting: Family Medicine

## 2020-01-23 ENCOUNTER — Other Ambulatory Visit: Payer: Self-pay

## 2020-01-23 ENCOUNTER — Emergency Department (HOSPITAL_COMMUNITY)
Admission: EM | Admit: 2020-01-23 | Discharge: 2020-01-23 | Disposition: A | Discharge status: Home / Self Care | Payer: Medicare Other | Attending: Emergency Medicine | Admitting: Emergency Medicine

## 2020-01-23 ENCOUNTER — Telehealth: Payer: Self-pay

## 2020-01-23 ENCOUNTER — Encounter: Payer: Self-pay | Admitting: Family Medicine

## 2020-01-23 DIAGNOSIS — R6889 Other general symptoms and signs: Secondary | ICD-10-CM | POA: Diagnosis not present

## 2020-01-23 DIAGNOSIS — I4892 Unspecified atrial flutter: Secondary | ICD-10-CM | POA: Diagnosis not present

## 2020-01-23 DIAGNOSIS — Z79899 Other long term (current) drug therapy: Secondary | ICD-10-CM | POA: Insufficient documentation

## 2020-01-23 DIAGNOSIS — J45909 Unspecified asthma, uncomplicated: Secondary | ICD-10-CM | POA: Diagnosis not present

## 2020-01-23 DIAGNOSIS — I130 Hypertensive heart and chronic kidney disease with heart failure and stage 1 through stage 4 chronic kidney disease, or unspecified chronic kidney disease: Secondary | ICD-10-CM | POA: Diagnosis not present

## 2020-01-23 DIAGNOSIS — Z743 Need for continuous supervision: Secondary | ICD-10-CM | POA: Diagnosis not present

## 2020-01-23 DIAGNOSIS — R109 Unspecified abdominal pain: Secondary | ICD-10-CM | POA: Diagnosis not present

## 2020-01-23 DIAGNOSIS — F17200 Nicotine dependence, unspecified, uncomplicated: Secondary | ICD-10-CM | POA: Insufficient documentation

## 2020-01-23 DIAGNOSIS — I509 Heart failure, unspecified: Secondary | ICD-10-CM | POA: Diagnosis not present

## 2020-01-23 DIAGNOSIS — R1084 Generalized abdominal pain: Secondary | ICD-10-CM | POA: Insufficient documentation

## 2020-01-23 DIAGNOSIS — K3189 Other diseases of stomach and duodenum: Secondary | ICD-10-CM | POA: Diagnosis not present

## 2020-01-23 DIAGNOSIS — Z7951 Long term (current) use of inhaled steroids: Secondary | ICD-10-CM | POA: Diagnosis not present

## 2020-01-23 DIAGNOSIS — N183 Chronic kidney disease, stage 3 unspecified: Secondary | ICD-10-CM | POA: Insufficient documentation

## 2020-01-23 DIAGNOSIS — K7460 Unspecified cirrhosis of liver: Secondary | ICD-10-CM | POA: Diagnosis not present

## 2020-01-23 DIAGNOSIS — K8020 Calculus of gallbladder without cholecystitis without obstruction: Secondary | ICD-10-CM | POA: Diagnosis not present

## 2020-01-23 LAB — CBC
HCT: 38.1 % — ABNORMAL LOW (ref 39.0–52.0)
Hemoglobin: 12.5 g/dL — ABNORMAL LOW (ref 13.0–17.0)
MCH: 34.3 pg — ABNORMAL HIGH (ref 26.0–34.0)
MCHC: 32.8 g/dL (ref 30.0–36.0)
MCV: 104.7 fL — ABNORMAL HIGH (ref 80.0–100.0)
Platelets: 126 10*3/uL — ABNORMAL LOW (ref 150–400)
RBC: 3.64 MIL/uL — ABNORMAL LOW (ref 4.22–5.81)
RDW: 12.2 % (ref 11.5–15.5)
WBC: 7.1 10*3/uL (ref 4.0–10.5)
nRBC: 0 % (ref 0.0–0.2)

## 2020-01-23 LAB — COMPREHENSIVE METABOLIC PANEL
ALT: 25 U/L (ref 0–44)
AST: 35 U/L (ref 15–41)
Albumin: 3.4 g/dL — ABNORMAL LOW (ref 3.5–5.0)
Alkaline Phosphatase: 73 U/L (ref 38–126)
Anion gap: 7 (ref 5–15)
BUN: 25 mg/dL — ABNORMAL HIGH (ref 8–23)
CO2: 27 mmol/L (ref 22–32)
Calcium: 10.2 mg/dL (ref 8.9–10.3)
Chloride: 107 mmol/L (ref 98–111)
Creatinine, Ser: 1.69 mg/dL — ABNORMAL HIGH (ref 0.61–1.24)
GFR calc Af Amer: 44 mL/min — ABNORMAL LOW (ref >60)
GFR calc non Af Amer: 38 mL/min — ABNORMAL LOW (ref >60)
Glucose, Bld: 122 mg/dL — ABNORMAL HIGH (ref 70–99)
Potassium: 3.9 mmol/L (ref 3.5–5.1)
Sodium: 141 mmol/L (ref 135–145)
Total Bilirubin: 1.1 mg/dL (ref 0.3–1.2)
Total Protein: 7.5 g/dL (ref 6.5–8.1)

## 2020-01-23 LAB — LIPASE, BLOOD: Lipase: 37 U/L (ref 11–51)

## 2020-01-23 LAB — LACTIC ACID, PLASMA: Lactic Acid, Venous: 1.7 mmol/L (ref 0.5–1.9)

## 2020-01-23 MED ORDER — FENTANYL CITRATE (PF) 100 MCG/2ML IJ SOLN
50.0000 ug | Freq: Once | INTRAMUSCULAR | Status: AC
  Administered 2020-01-23: 50 ug via INTRAVENOUS
  Filled 2020-01-23: qty 2

## 2020-01-23 NOTE — ED Triage Notes (Addendum)
Pt arrives to ED via [facility] w/ c/o 10/10 generalized abdominal pain radiating to back, worse on R side. Pt denies n/v/d, chest pain, sob. Pt HTN w/ ems but VS otherwise stable. Pt has hx of cirrhosis, ckd, a-flutter, HTN. Pt bradycardic in triage.

## 2020-01-23 NOTE — Assessment & Plan Note (Signed)
Patient with 10/10 abdominal pain of uncertain etiology. Given his history of cirrhosis s/p TIPS, longstanding hypertension, and recent imaging showing gallstones, there is concern for TIPS complication vs. Abdominal aortic dissection vs. gallstone pancreatis. Definitive diagnosis cannot be made without further emergent laboratory workup and imaging.  - EMS called for transport to the [facility] Emergency Department.  - Emergency department notified of patient in transit.

## 2020-01-23 NOTE — Progress Notes (Addendum)
Subjective:  Patient ID: Donald Moreno  DOB: 1940/07/13 MRN: [id number]  Donald Moreno is a 79 y.o. male with a PMH of cirrhosis s/p TIPS, htn, HFpEF, atrial flutter with RVR, CKD here today for 10/10 abdominal pain.   HPI:  Abdominal Pain: - Pt. Reports sudden onset of abdominal pain this morning. The pain is 10/10 and started this morning after he ate breakfast and went for a walk. The pain is worst across his upper abdomen and radiates to his back. He tried to taking   some antacids with no relief of symptoms. He made a same-day appointment and was promptly seen in our clinic.  He denies any nausea, vomiting, or diarrhea. His last bowel movement was this morning and was normal, without blood. He denies headache, dizziness, or weakness.  He denies dysuria or flank pain. He has not noticed any abdominal swelling, jaundice, or scleral icterus.  He is a never-smoker, but has been using smokeless tobacco since he was a teenager.     Objective:  BP 132/78    Pulse 84    Wt 167 lb 3.2 oz (75.8 kg)    SpO2 97%    BMI 23.99 kg/m   Vitals and nursing note reviewed  Physical Exam Constitutional:      General: He is in acute distress.     Appearance: He is ill-appearing. He is not diaphoretic.  Cardiovascular:     Rate and Rhythm: Normal rate.     Pulses:          Radial pulses are 2+ on the right side and 2+ on the left side.       Femoral pulses are 2+ on the right side and 2+ on the left side.      Posterior tibial pulses are 2+ on the right side and 2+ on the left side.     Heart sounds: Normal heart sounds.  Pulmonary:     Effort: No respiratory distress.     Breath sounds: Normal breath sounds.  Abdominal:     General: Bowel sounds are normal. There is no distension.     Palpations: Abdomen is soft. There is hepatomegaly. There is no fluid wave or pulsatile mass.     Tenderness: There is abdominal tenderness in the right upper quadrant, epigastric area and left upper quadrant.  There is no right CVA tenderness or left CVA tenderness.      Assessment & Plan:   Abdominal pain Patient with 10/10 abdominal pain of uncertain etiology. Given his history of cirrhosis s/p TIPS, longstanding hypertension, and recent imaging showing gallstones, there is concern for TIPS complication vs. Abdominal aortic dissection vs. gallstone pancreatis. Definitive diagnosis cannot be made without further emergent laboratory workup and imaging.  - EMS called for transport to the [facility] Emergency Department.  - Emergency department notified of patient in transit.    Dietrich Student   FPTS Upper-Level Resident Addendum   I have independently interviewed and examined the patient. I have discussed the above with the original author and agree with their documentation. My edits for correction/addition/clarification have been made.  My physical exam is documented below.    Physical Exam:  General: 79 y.o. male, ill-appearing, hunched over in chair in pain Cardio: RRR no m/r/g, 2+ femoral pulses b/l Lungs: CTAB, no wheezing, no rhonchi, no crackles, no IWOB on RA Abdomen: Soft, diffuse TTP of epigastric region and RUQ and LUQ, non-distended, positive bowel sounds Skin: warm and dry Extremities: No  edema MSK: no TTP spinous processes, no reproducible TTP of paraspinal musculature  Abdominal Pain Given patient's significant pain, sudden onset, and history of TIPS, concern for possible pancreatitis (gallstones seen on RUQ [place] in May), TIPS procedure failure, history of cirrhosis also increases risk of bleeding, and while suspicion is low given symmetrical pulses, could also consider abdominal aortic dissection.  Less likely PE and MI given location and reproducibility.  Called EMS and patient was taken for transport.  Report given to charge nurse in ED and FPTS was updated of patient's status and possible admission.  This case was precepted with Dr. Owens Shark who agreed to the  plan.  [place] Constable, D.O. PGY-3, [facility] 01/23/2020 4:25 PM

## 2020-01-23 NOTE — Patient Instructions (Signed)
Patient went to ED via EMS

## 2020-01-23 NOTE — ED Provider Notes (Signed)
[facility] EMERGENCY DEPARTMENT Provider Note   CSN: [id number] Arrival date & time: 01/23/20  1609     History Chief Complaint  Patient presents with  . Abdominal Pain    Donald Moreno is a 79 y.o. male past medical history of CHF, CKD, hypertension, alcoholic cirrhosis with a history of SBP presents the ED complaining of abdominal pain.  Patient states that this began relatively suddenly around 10:00 this morning.  States that it is sharp, radiates around to his back is 10/10 in severity.  Denies associated nausea or vomiting or fever or chills.  States that he attempted eating and taking antacids without improvement.  Denies constipation.  The history is provided by the patient.  Abdominal Pain Pain location:  Generalized Pain quality: sharp   Pain radiates to:  Back Pain severity:  Severe Onset quality:  Sudden Duration:  5 hours Timing:  Constant Progression:  Worsening Chronicity:  New Associated symptoms: no chest pain, no chills, no constipation, no cough, no diarrhea, no dysuria, no fever, no nausea, no shortness of breath and no vomiting        Past Medical History:  Diagnosis Date  . Acute congestive heart failure ([place])   . Arthritis    "all over"  . Asthma   . Blood in stool 04/11/2017  . Chronic kidney disease    "I see a kidney dr @ [facility]" (10/11/2016)  . Chronic lower back pain    "turned a truck over a long time ago" (10/11/2016)  . Dysrhythmia    bradycardia with occassional junctional rhythm  . ETOH abuse   . Gout   . Hypertension   . Inguinal hernia 10/28/2014   S/P inguinal hernia repair with 4L as ascites removed 03/30/17  . Liver cirrhosis ([place]) 2015  . Macrocytosis   . Pneumonia    "long long time ago" (10/11/2016)  . Spontaneous bacterial peritonitis ([place]) 07/01/2015    Patient Active Problem List   Diagnosis Date Noted  . Tinnitus 11/19/2019  . History of anemia due to CKD 02/20/2018  . Secondary  hyperparathyroidism of renal origin ([place]) 02/20/2018  . Macrocytic anemia 04/20/2017  . Congestive heart failure ([place])   . Hematuria 01/30/2017  . Alcoholic cirrhosis of liver without ascites ([place])   . Typical atrial flutter ([place])   . Abdominal pain 07/01/2015  . CKD (chronic kidney disease) stage 3, GFR 30-59 ml/min ([facility]) 05/27/2015  . At risk for decreased bone density 03/04/2015  . Bradycardia 07/08/2013  . Advanced directives, counseling/discussion 01/03/2011  . Asthma 01/29/2010  . Gout 07/13/2006  . HYPERTENSION, BENIGN SYSTEMIC 07/13/2006  . Arthritis 07/13/2006    Past Surgical History:  Procedure Laterality Date  . INGUINAL HERNIA REPAIR Bilateral 03/30/2017   Procedure: OPEN BILATERAL INGUINAL HERNIA REPAIR WITH MESH;  Surgeon: Kinsinger, Arta Bruce, MD;  Location: [facility];  Service: General;  Laterality: Bilateral;  GENERAL COMBINED WITH REGIONAL FOR POST OP PAIN   . INSERTION OF MESH N/A 10/16/2016   Procedure: INSERTION OF MESH;  Surgeon: Kinsinger, Arta Bruce, MD;  Location: [facility];  Service: General;  Laterality: N/A;  . INSERTION OF MESH Bilateral 03/30/2017   Procedure: INSERTION OF MESH;  Surgeon: Kieth Brightly Arta Bruce, MD;  Location: [facility];  Service: General;  Laterality: Bilateral;  GENERAL COMBINED WITH REGIONAL FOR POST OP PAIN   . IR PARACENTESIS  08/19/2016  . IR PARACENTESIS  08/26/2016  . IR PARACENTESIS  09/02/2016  . IR PARACENTESIS  09/09/2016  .  IR PARACENTESIS  09/16/2016  . IR PARACENTESIS  10/03/2016  . IR PARACENTESIS  10/11/2016  . IR RADIOLOGIST EVAL & MGMT  09/29/2016  . IR RADIOLOGIST EVAL & MGMT  12/21/2016  . IR RADIOLOGIST EVAL & MGMT  01/23/2018  . IR RADIOLOGIST EVAL & MGMT  02/19/2019  . IR TIPS  10/11/2016  . RADIOLOGY WITH ANESTHESIA N/A 10/11/2016   Procedure: TIPS;  Surgeon: Arne Cleveland, MD;  Location: [facility];  Service: Radiology;  Laterality: N/A;  . UMBILICAL HERNIA REPAIR N/A 10/16/2016   Procedure: HERNIA REPAIR UMBILICAL ADULT;  Surgeon:  Kinsinger, Arta Bruce, MD;  Location: [facility];  Service: General;  Laterality: N/A;       Family History  Problem Relation Age of Onset  . Other Mother        died when pt was only 23 - unknown cause.  . Other Father        deceased.  . Asthma Sister   . Heart disease Sister        multiple stents.  . Asthma Brother   . Hypertension Brother   . Asthma Son   . Asthma Brother   . Hypertension Brother     Social History   Tobacco Use  . Smoking status: Never Smoker  . Smokeless tobacco: Current User    Types: Snuff, Chew  . Tobacco comment: Started chewing tobacco at 79 years old  Vaping Use  . Vaping Use: Never used  Substance Use Topics  . Alcohol use: No    Comment: quit drinking "over 2 yr ago" (10/11/2016)  . Drug use: No    Home Medications Prior to Admission medications   Medication Sig Start Date End Date Taking? Authorizing Provider  albuterol (PROAIR HFA) 108 (90 Base) MCG/ACT inhaler Inhale 2 puffs into the lungs every 4 (four) hours as needed for wheezing. 05/24/18   Shirley, [place], DO  allopurinol (ZYLOPRIM) 100 MG tablet Take 0.5 tablets (50 mg total) by mouth daily. 12/23/19   Lilland, Alana, DO  bisoprolol (ZEBETA) 10 MG tablet TAKE 1 TABLET BY MOUTH EVERY DAY Patient taking differently: Take 10 mg by mouth daily.  02/25/19   Sherran Needs, NP  Cholecalciferol (VITAMIN D-3) 5000 units TABS Take 5,000 Units daily by mouth.     [provider]  dapagliflozin propanediol (FARXIGA) 10 MG TABS tablet Take 1 tablet (10 mg total) by mouth daily. 12/05/19   Leavy Cella, RPH-CPP  ferrous sulfate 325 (65 FE) MG tablet Take 1 tablet (325 mg total) by mouth every other day. 01/02/20   Lilland, Alana, DO  lactulose (CHRONULAC) 10 GM/15ML solution Take 15 mLs (10 g total) by mouth 2 (two) times daily as needed for mild constipation. (NEED TO ENSURE 2 SOFT BM IN A DAY.). 10/21/19   Shirley, [place], DO  montelukast (SINGULAIR) 10 MG tablet TAKE 1 TABLET BY MOUTH  EVERYDAY AT BEDTIME Patient taking differently: Take 10 mg by mouth at bedtime.  08/05/19   Shirley, [place], DO  Nutritional Supplements (ENSURE COMPLETE SHAKE) LIQD Take 1 Container by mouth daily. Patient taking differently: Take 1 Container 2 (two) times daily by mouth.  03/04/15   Katheren Shams, DO  spironolactone (ALDACTONE) 50 MG tablet Take 1 tablet (50 mg total) by mouth daily. 11/08/19   Croitoru, Mihai, MD  WIXELA INHUB 500-50 MCG/DOSE AEPB INHALE 1 PUFF BY MOUTH TWICE A DAY Patient taking differently: Inhale 1 puff into the lungs in the morning and at bedtime.  06/24/19   Shirley, [place], DO  Fluticasone-Salmeterol [facility] INHUB) 500-50 MCG/DOSE AEPB Inhale 1 puff into the lungs 2 (two) times daily. 10/27/17   Shirley, [place], DO  furosemide (LASIX) 40 MG tablet TAKE 1 TAB 3 TIMES A DAY FOR WEIGHT >168LB 2 TIMES A DAY FOR WEIGHT 163-168LB ONCE DAILY FOR WEIGHT <163LB 12/24/18 07/20/19  Enid Derry, [place], DO  montelukast (SINGULAIR) 10 MG tablet TAKE 1 TABLET BY MOUTH EVERYDAY AT BEDTIME 07/27/18   Enid Derry, [place], DO  spironolactone (ALDACTONE) 25 MG tablet TAKE 1 TABLET BY MOUTH EVERY DAY 10/17/18   Shirley, [place], DO    Allergies    Patient has no known allergies.  Review of Systems   Review of Systems  Constitutional: Negative for chills and fever.  HENT: Negative for facial swelling and voice change.   Eyes: Negative for redness and visual disturbance.  Respiratory: Negative for cough and shortness of breath.   Cardiovascular: Negative for chest pain and palpitations.  Gastrointestinal: Positive for abdominal pain. Negative for abdominal distention, anal bleeding, constipation, diarrhea, nausea and vomiting.  Genitourinary: Negative for difficulty urinating and dysuria.  Musculoskeletal: Negative for gait problem and joint swelling.  Skin: Negative for rash and wound.  Neurological: Negative for dizziness and headaches.  Psychiatric/Behavioral: Negative for confusion and suicidal  ideas.    Physical Exam Updated Vital Signs BP (!) 181/65   Pulse (!) 35   Temp 97.7 F (36.5 C) (Oral)   Resp 17   Ht 5\' 10"  (1.778 m)   Wt 75.8 kg   SpO2 98%   BMI 23.96 kg/m   Physical Exam Constitutional:      General: He is not in acute distress. HENT:     Head: Normocephalic and atraumatic.     Mouth/Throat:     Mouth: Mucous membranes are moist.     Pharynx: Oropharynx is clear.  Eyes:     General: No scleral icterus.    Pupils: Pupils are equal, round, and reactive to light.  Cardiovascular:     Rate and Rhythm: Normal rate and regular rhythm.     Pulses: Normal pulses.  Pulmonary:     Effort: Pulmonary effort is normal. No respiratory distress.  Abdominal:     General: A surgical scar is present. There is no distension.     Palpations: Abdomen is soft.     Tenderness: There is generalized abdominal tenderness. There is guarding. There is no rebound.  Musculoskeletal:        General: No tenderness or deformity.     Cervical back: Normal range of motion and neck supple.  Neurological:     General: No focal deficit present.     Mental Status: He is alert and oriented to person, place, and time.  Psychiatric:        Mood and Affect: Mood normal.        Behavior: Behavior normal.     ED Results / Procedures / Treatments   Labs (all labs ordered are listed, but only abnormal results are displayed) Labs Reviewed  COMPREHENSIVE METABOLIC PANEL - Abnormal; Notable for the following components:      Result Value   Glucose, Bld 122 (*)    BUN 25 (*)    Creatinine, Ser 1.69 (*)    Albumin 3.4 (*)    GFR calc non Af Amer 38 (*)    GFR calc Af Amer 44 (*)    All other components within normal limits  CBC - Abnormal; Notable for the  following components:   RBC 3.64 (*)    Hemoglobin 12.5 (*)    HCT 38.1 (*)    MCV 104.7 (*)    MCH 34.3 (*)    Platelets 126 (*)    All other components within normal limits  LIPASE, BLOOD  LACTIC ACID, PLASMA  URINALYSIS,  ROUTINE W REFLEX MICROSCOPIC    EKG EKG Interpretation  Date/Time:  Thursday January 23 2020 16:18:23 EDT Ventricular Rate:  39 PR Interval:    QRS Duration: 90 QT Interval:  518 QTC Calculation: 416 R Axis:   8 Text Interpretation: Atrial flutter with variable A-V block Minimal voltage criteria for LVH, may be normal variant ( R in aVL ) No significant change since last tracing Confirmed by Blanchie Dessert [phone]) on 01/23/2020 5:06:15 PM   Radiology CT ABDOMEN PELVIS WO CONTRAST  Result Date: 01/23/2020 CLINICAL DATA:  Bowel obstruction suspected. EXAM: CT ABDOMEN AND PELVIS WITHOUT CONTRAST TECHNIQUE: Multidetector CT imaging of the abdomen and pelvis was performed following the standard protocol without IV contrast. COMPARISON:  08/28/2019 FINDINGS: Lower chest: The lung bases are clear.The heart size is enlarged. There is bilateral gynecomastia. Hepatobiliary: The liver appears cirrhotic. There is a TIPS in place. There is a geographic hypoattenuating area within the right hepatic lobe which appears similar to prior study. Cholelithiasis without acute inflammation.There is no biliary ductal dilation. Pancreas: Normal contours without ductal dilatation. No peripancreatic fluid collection. Spleen: Unremarkable. Adrenals/Urinary Tract: --Adrenal glands: Unremarkable. --Right kidney/ureter: No hydronephrosis or radiopaque kidney stones. --Left kidney/ureter: No hydronephrosis or radiopaque kidney stones. --Urinary bladder: Unremarkable. Stomach/Bowel: --Stomach/Duodenum: The stomach is significantly distended. --Small bowel: Unremarkable. --Colon: There are few scattered colonic diverticula without CT evidence for diverticulitis. --Appendix: Normal. Vascular/Lymphatic: Atherosclerotic calcification is present within the non-aneurysmal abdominal aorta, without hemodynamically significant stenosis. --No retroperitoneal lymphadenopathy. --No mesenteric lymphadenopathy. --No pelvic or inguinal  lymphadenopathy. Reproductive: Unremarkable Other: No ascites or free air. The abdominal wall is normal. Musculoskeletal. There is avascular necrosis of the bilateral femoral heads, left worse than right. There is no acute displaced fracture. IMPRESSION: 1. Significantly distended stomach. 2. Cirrhosis with a TIPS in place. 3. Cholelithiasis without acute inflammation. 4. Avascular necrosis of the bilateral femoral heads, left worse than right. Aortic Atherosclerosis (ICD10-I70.0). Electronically Signed   By: Constance Holster M.D.   On: 01/23/2020 19:43    Procedures Procedures (including critical care time)  Medications Ordered in ED Medications  fentaNYL (SUBLIMAZE) injection 50 mcg (50 mcg Intravenous Given 01/23/20 1708)    ED Course  I have reviewed the triage vital signs and the nursing notes.  Pertinent labs & imaging results that were available during my care of the patient were reviewed by me and considered in my medical decision making (see chart for details).    MDM Rules/Calculators/A&P                          EKG findings by my read: Compared to prior: 01/14/20.  Rate: 39 Rhythm: atrial flutter Axis: appropriate  PR: N/A QRS: 90 QTc: 416.  Atrial flutter with slow variable ventricular response similar to prior studies, otherwise no evidence of ischemia or arrhythmia, nor any other pathologic findings concerning considering patient presentation. Findings discussed with attending who agrees.  Differential diagnosis considered: AAA, bowel obstruction, interbowel abscess or infection, renal pathology including stone, SBP  Patient presents with relatively sudden onset of severe lower abdominal pain per his report, his exam is largely reassuring however as  the patient does not appear to be in any distress and his abdominal exam is nonspecific with some tenderness but no rebound.  He does have extensive surgical history to the abdomen and has not had any bowel movement since the  initiation of his pain but has up to this point been relatively normal from my bowel habit standpoint.  No nausea or vomiting or fevers.  Quick point of care ultrasound performed at bedside by myself, grossly normal abdominal aorta with no peritoneal fluid  We will obtain abdominal laboratory panel including urine, will obtain CT of the abdomen without contrast to evaluate for fluid.  CBC, chemistries, lipase all returned within normal limits, urinalysis not yet back but CT imaging showing no evidence of intra-abdominal infection, no stranding around the urinary system with no urinary complaints, fine that this is likely unnecessary.  Reassessment, patient symptomatically completely improved, vital signs stable  Patient with chronic avascular necrosis to bilateral hips will need to be managed outpatient, patient informed of this.  Plan communicated the patient's legal guardian expressed understanding.  Labs reviewed and interpreted by myself with significant findings above. Imaging reviewed by myself and interpreted by radiologist.  Case and plan above discussed with my attending Dr. Maryan Rued   Final Clinical Impression(s) / ED Diagnoses Final diagnoses:  Generalized abdominal pain    Rx / DC Orders ED Discharge Orders    None     Labs, studies and imaging reviewed by myself and considered in medical decision making if ordered. Imaging interpreted by radiology. Pt was discussed with my attending, Dr. Maryan Rued  Electronically signed by:  Roderic Palau [place]9/9/20219:06 PM       Renold Genta, MD 01/23/20 [id number]    Blanchie Dessert, MD 01/23/20 2316

## 2020-01-23 NOTE — ED Notes (Signed)
Provider aware of patients HR being in the 30-40s. Will continue to monitor the patient at this time.

## 2020-01-23 NOTE — Telephone Encounter (Signed)
Patient calls nurse line reporting sudden onset abdominal and back pain. Patient reports constant sharp pain around umbilicus. Denies nausea, vomiting, diarrhea. Patient reports last BM this morning. Patient reports BM was normal and denies blood in stool. So far today, patient has had water and coffee to drink and a small amount of bread.   Patient requesting appointment as soon as possible for evaluation.   Scheduled same day appointment with Dr. Sandi Carne at 3:10 p.m. for evaluation.   [name], RN

## 2020-01-29 ENCOUNTER — Other Ambulatory Visit: Payer: Self-pay

## 2020-01-29 ENCOUNTER — Ambulatory Visit (INDEPENDENT_AMBULATORY_CARE_PROVIDER_SITE_OTHER): Payer: Medicare Other | Admitting: Family Medicine

## 2020-01-29 VITALS — BP 130/80 | HR 88 | Ht 70.5 in | Wt 163.4 lb

## 2020-01-29 DIAGNOSIS — R1084 Generalized abdominal pain: Secondary | ICD-10-CM | POA: Diagnosis not present

## 2020-01-29 NOTE — Assessment & Plan Note (Signed)
Resolved.  Patient had negative CT abdomen pelvis and labs.  No need to do anything else at this time.  Return to care if recurs again.  Also advised to follow-up with GI doctor as scheduled.  Follow-up with PCP in 1 to 2 months.

## 2020-01-29 NOTE — Progress Notes (Signed)
    SUBJECTIVE:   CHIEF COMPLAINT / HPI:   Abdominal pain follow-up Patient seen on 9/9 after sudden onset of severe abdominal pain, he was sent to ED for evaluation CT abdomen pelvis performed that showed no acute process Labs are unrevealing for any acute process as well Patient's abdominal pain improved while at the ED and he was discharged home Patient reports that he has not had any abdominal pain since leaving the emergency room Has not had any fevers, has been eating well, feels to be in his normal state of health Reports good bowel movements He presents today only for follow-up  PERTINENT  PMH / PSH: Hypertension, a flutter, cirrhosis s/p TIPS secondary hyperparathyroidism, CKD 3, gout  OBJECTIVE:   BP 130/80   Pulse 88   Ht 5' 10.5" (1.791 m)   Wt 163 lb 6.4 oz (74.1 kg)   SpO2 99%   BMI 23.11 kg/m    Physical Exam:  General: 79 y.o. male in NAD Cardio: RRR no m/r/g Lungs: CTAB, no wheezing, no rhonchi, no crackles, no IWOB on RA Abdomen: Soft, non-tender to palpation, non-distended, positive bowel sounds Skin: warm and dry Extremities: No edema   ASSESSMENT/PLAN:   Abdominal pain Resolved.  Patient had negative CT abdomen pelvis and labs.  No need to do anything else at this time.  Return to care if recurs again.  Also advised to follow-up with GI doctor as scheduled.  Follow-up with PCP in 1 to 2 months.     Cleophas Dunker, [place]

## 2020-01-29 NOTE — Patient Instructions (Signed)
Thank you for coming to see me today. It was a pleasure. Today we talked about:   I'm so glad that you are feeling better!  Please follow-up with your PCP in 1-2 months.  If you have any questions or concerns, please do not hesitate to call the office at [phone].  Best,   [place], DO

## 2020-02-04 ENCOUNTER — Other Ambulatory Visit: Payer: Self-pay

## 2020-02-04 MED ORDER — MONTELUKAST SODIUM 10 MG PO TABS
10.0000 mg | ORAL_TABLET | Freq: Every day | ORAL | 3 refills | Status: DC
Start: 2020-02-04 — End: 2020-04-28

## 2020-02-07 ENCOUNTER — Other Ambulatory Visit: Payer: Self-pay

## 2020-02-07 DIAGNOSIS — J45909 Unspecified asthma, uncomplicated: Secondary | ICD-10-CM

## 2020-02-09 MED ORDER — FLUTICASONE-SALMETEROL 500-50 MCG/DOSE IN AEPB: INHALATION_SPRAY | RESPIRATORY_TRACT | 1 refills | Status: DC

## 2020-02-17 DIAGNOSIS — N189 Chronic kidney disease, unspecified: Secondary | ICD-10-CM | POA: Diagnosis not present

## 2020-02-17 DIAGNOSIS — N183 Chronic kidney disease, stage 3 unspecified: Secondary | ICD-10-CM | POA: Diagnosis not present

## 2020-02-25 ENCOUNTER — Ambulatory Visit (INDEPENDENT_AMBULATORY_CARE_PROVIDER_SITE_OTHER): Payer: Medicare Other

## 2020-02-25 ENCOUNTER — Other Ambulatory Visit: Payer: Self-pay

## 2020-02-25 DIAGNOSIS — Z23 Encounter for immunization: Secondary | ICD-10-CM

## 2020-02-25 NOTE — Progress Notes (Signed)
Patient presents to nurse clinic for flu vaccination. Patient denies history of allergic reaction to vaccine and denies sick symptoms. Administered in RD, site unremarkable, tolerated injection well.   [name], RN

## 2020-02-27 ENCOUNTER — Other Ambulatory Visit: Payer: Self-pay | Admitting: Interventional Radiology

## 2020-02-27 DIAGNOSIS — Z95828 Presence of other vascular implants and grafts: Secondary | ICD-10-CM

## 2020-03-03 ENCOUNTER — Other Ambulatory Visit: Payer: Self-pay

## 2020-03-03 ENCOUNTER — Emergency Department (HOSPITAL_COMMUNITY)
Admission: EM | Admit: 2020-03-03 | Discharge: 2020-03-04 | Disposition: A | Discharge status: Home / Self Care | Payer: Medicare Other | Attending: Emergency Medicine | Admitting: Emergency Medicine

## 2020-03-03 ENCOUNTER — Encounter (HOSPITAL_COMMUNITY): Payer: Self-pay | Admitting: Emergency Medicine

## 2020-03-03 DIAGNOSIS — Z7951 Long term (current) use of inhaled steroids: Secondary | ICD-10-CM | POA: Diagnosis not present

## 2020-03-03 DIAGNOSIS — N183 Chronic kidney disease, stage 3 unspecified: Secondary | ICD-10-CM | POA: Diagnosis not present

## 2020-03-03 DIAGNOSIS — R1013 Epigastric pain: Secondary | ICD-10-CM | POA: Insufficient documentation

## 2020-03-03 DIAGNOSIS — K7460 Unspecified cirrhosis of liver: Secondary | ICD-10-CM | POA: Diagnosis not present

## 2020-03-03 DIAGNOSIS — Z20822 Contact with and (suspected) exposure to covid-19: Secondary | ICD-10-CM | POA: Diagnosis not present

## 2020-03-03 DIAGNOSIS — M8789 Other osteonecrosis, multiple sites: Secondary | ICD-10-CM | POA: Diagnosis not present

## 2020-03-03 DIAGNOSIS — R112 Nausea with vomiting, unspecified: Secondary | ICD-10-CM | POA: Diagnosis not present

## 2020-03-03 DIAGNOSIS — I130 Hypertensive heart and chronic kidney disease with heart failure and stage 1 through stage 4 chronic kidney disease, or unspecified chronic kidney disease: Secondary | ICD-10-CM | POA: Insufficient documentation

## 2020-03-03 DIAGNOSIS — F17220 Nicotine dependence, chewing tobacco, uncomplicated: Secondary | ICD-10-CM | POA: Insufficient documentation

## 2020-03-03 DIAGNOSIS — R1011 Right upper quadrant pain: Secondary | ICD-10-CM | POA: Diagnosis not present

## 2020-03-03 DIAGNOSIS — J45909 Unspecified asthma, uncomplicated: Secondary | ICD-10-CM | POA: Insufficient documentation

## 2020-03-03 DIAGNOSIS — I509 Heart failure, unspecified: Secondary | ICD-10-CM | POA: Insufficient documentation

## 2020-03-03 DIAGNOSIS — K8020 Calculus of gallbladder without cholecystitis without obstruction: Secondary | ICD-10-CM | POA: Diagnosis not present

## 2020-03-03 DIAGNOSIS — R109 Unspecified abdominal pain: Secondary | ICD-10-CM | POA: Diagnosis present

## 2020-03-03 DIAGNOSIS — Z79899 Other long term (current) drug therapy: Secondary | ICD-10-CM | POA: Insufficient documentation

## 2020-03-03 LAB — URINALYSIS, ROUTINE W REFLEX MICROSCOPIC
Bacteria, UA: NONE SEEN
Bilirubin Urine: NEGATIVE
Glucose, UA: >=500 mg/dL — AB
Ketones, ur: NEGATIVE mg/dL
Leukocytes,Ua: NEGATIVE
Nitrite: NEGATIVE
Protein, ur: >=300 mg/dL — AB
Specific Gravity, Urine: 1.027 (ref 1.005–1.030)
pH: 6 (ref 5.0–8.0)

## 2020-03-03 LAB — COMPREHENSIVE METABOLIC PANEL
ALT: 19 U/L (ref 0–44)
AST: 26 U/L (ref 15–41)
Albumin: 3.6 g/dL (ref 3.5–5.0)
Alkaline Phosphatase: 60 U/L (ref 38–126)
Anion gap: 11 (ref 5–15)
BUN: 18 mg/dL (ref 8–23)
CO2: 23 mmol/L (ref 22–32)
Calcium: 9.9 mg/dL (ref 8.9–10.3)
Chloride: 106 mmol/L (ref 98–111)
Creatinine, Ser: 1.89 mg/dL — ABNORMAL HIGH (ref 0.61–1.24)
GFR, Estimated: 33 mL/min — ABNORMAL LOW (ref >60)
Glucose, Bld: 110 mg/dL — ABNORMAL HIGH (ref 70–99)
Potassium: 3.5 mmol/L (ref 3.5–5.1)
Sodium: 140 mmol/L (ref 135–145)
Total Bilirubin: 1.3 mg/dL — ABNORMAL HIGH (ref 0.3–1.2)
Total Protein: 7.7 g/dL (ref 6.5–8.1)

## 2020-03-03 LAB — CBC
HCT: 38.8 % — ABNORMAL LOW (ref 39.0–52.0)
Hemoglobin: 12.9 g/dL — ABNORMAL LOW (ref 13.0–17.0)
MCH: 34.5 pg — ABNORMAL HIGH (ref 26.0–34.0)
MCHC: 33.2 g/dL (ref 30.0–36.0)
MCV: 103.7 fL — ABNORMAL HIGH (ref 80.0–100.0)
Platelets: 145 10*3/uL — ABNORMAL LOW (ref 150–400)
RBC: 3.74 MIL/uL — ABNORMAL LOW (ref 4.22–5.81)
RDW: 13 % (ref 11.5–15.5)
WBC: 6.4 10*3/uL (ref 4.0–10.5)
nRBC: 0 % (ref 0.0–0.2)

## 2020-03-03 LAB — LIPASE, BLOOD: Lipase: 62 U/L — ABNORMAL HIGH (ref 11–51)

## 2020-03-03 MED ORDER — ONDANSETRON HCL 4 MG/2ML IJ SOLN
4.0000 mg | Freq: Once | INTRAMUSCULAR | Status: AC
  Administered 2020-03-03: 4 mg via INTRAVENOUS
  Filled 2020-03-03: qty 2

## 2020-03-03 MED ORDER — FENTANYL CITRATE (PF) 100 MCG/2ML IJ SOLN
50.0000 ug | Freq: Once | INTRAMUSCULAR | Status: AC
  Administered 2020-03-03: 50 ug via INTRAVENOUS
  Filled 2020-03-03: qty 2

## 2020-03-03 MED ORDER — IOHEXOL 9 MG/ML PO SOLN
ORAL | Status: AC
  Filled 2020-03-03: qty 1000

## 2020-03-03 MED ORDER — SODIUM CHLORIDE 0.9 % IV BOLUS
500.0000 mL | Freq: Once | INTRAVENOUS | Status: AC
  Administered 2020-03-03: 500 mL via INTRAVENOUS

## 2020-03-03 NOTE — ED Triage Notes (Addendum)
Patient arrives to ED with complaints of RUQ & LUQ sharp abdominal pain that radiates to his mid back. Denies nausea, vomiting, dysuria. Last BM this morning. Hx of cirrhosis and ETOH abuse.

## 2020-03-03 NOTE — ED Provider Notes (Signed)
[facility] EMERGENCY DEPARTMENT Provider Note   CSN: [id number] Arrival date & time: 03/03/20  1140     History Chief Complaint  Patient presents with   Abdominal Pain    Donald Moreno is a 79 y.o. male.  The history is provided by the patient and medical records.  Abdominal Pain Associated symptoms: nausea and vomiting    79 y.o. M with hx of CHF, arthritis, asthma, CKD, chronic back pain, alcohol abuse, cirrhosis s/p TIPS procedure, HTN, gout, presenting to the ED for epigastric abdominal pain.  States this began last evening and has been persistent.  States he feels it in the upper part of abdomen and middle of his back. He did vomit x1 in the waiting room.  Denies diarrhea, fever, chills, sweats.  States he ate a steak last night for dinner and was wondering if that is what caused this.  He is followed by GI, Dr. Paulita Fujita, but has not seen him in a few months.  Past Medical History:  Diagnosis Date   Acute congestive heart failure ([place])    Arthritis    "all over"   Asthma    Blood in stool 04/11/2017   Chronic kidney disease    "I see a kidney dr @ [facility]" (10/11/2016)   Chronic lower back pain    "turned a truck over a long time ago" (10/11/2016)   Dysrhythmia    bradycardia with occassional junctional rhythm   ETOH abuse    Gout    Hypertension    Inguinal hernia 10/28/2014   S/P inguinal hernia repair with 4L as ascites removed 03/30/17   Liver cirrhosis ([place]) 2015   Macrocytosis    Pneumonia    "long long time ago" (10/11/2016)   Spontaneous bacterial peritonitis ([place]) 07/01/2015    Patient Active Problem List   Diagnosis Date Noted   Tinnitus 11/19/2019   History of anemia due to CKD 02/20/2018   Secondary hyperparathyroidism of renal origin ([place]) 02/20/2018   Macrocytic anemia 04/20/2017   Congestive heart failure (HCC)    Hematuria 63/33/5456   Alcoholic cirrhosis of liver without ascites (HCC)     Typical atrial flutter (HCC)    Abdominal pain 07/01/2015   CKD (chronic kidney disease) stage 3, GFR 30-59 ml/min ([place]) 05/27/2015   At risk for decreased bone density 03/04/2015   Bradycardia 07/08/2013   Advanced directives, counseling/discussion 01/03/2011   Asthma 01/29/2010   Gout 07/13/2006   HYPERTENSION, BENIGN SYSTEMIC 07/13/2006   Arthritis 07/13/2006    Past Surgical History:  Procedure Laterality Date   INGUINAL HERNIA REPAIR Bilateral 03/30/2017   Procedure: OPEN BILATERAL INGUINAL HERNIA REPAIR WITH MESH;  Surgeon: Kinsinger, Arta Bruce, MD;  Location: [facility];  Service: General;  Laterality: Bilateral;  GENERAL COMBINED WITH REGIONAL FOR POST OP PAIN    INSERTION OF MESH N/A 10/16/2016   Procedure: INSERTION OF MESH;  Surgeon: Kieth Brightly Arta Bruce, MD;  Location: [place];  Service: General;  Laterality: N/A;   INSERTION OF MESH Bilateral 03/30/2017   Procedure: INSERTION OF MESH;  Surgeon: Kieth Brightly Arta Bruce, MD;  Location: [facility];  Service: General;  Laterality: Bilateral;  GENERAL COMBINED WITH REGIONAL FOR POST OP PAIN    IR PARACENTESIS  08/19/2016   IR PARACENTESIS  08/26/2016   IR PARACENTESIS  09/02/2016   IR PARACENTESIS  09/09/2016   IR PARACENTESIS  09/16/2016   IR PARACENTESIS  10/03/2016   IR PARACENTESIS  10/11/2016   IR RADIOLOGIST EVAL &  MGMT  09/29/2016   IR RADIOLOGIST EVAL & MGMT  12/21/2016   IR RADIOLOGIST EVAL & MGMT  01/23/2018   IR RADIOLOGIST EVAL & MGMT  02/19/2019   IR TIPS  10/11/2016   RADIOLOGY WITH ANESTHESIA N/A 10/11/2016   Procedure: TIPS;  Surgeon: Arne Cleveland, MD;  Location: [place];  Service: Radiology;  Laterality: N/A;   UMBILICAL HERNIA REPAIR N/A 10/16/2016   Procedure: HERNIA REPAIR UMBILICAL ADULT;  Surgeon: Kinsinger, Arta Bruce, MD;  Location: [place];  Service: General;  Laterality: N/A;       Family History  Problem Relation Age of Onset   Other Mother        died when pt was only 18 - unknown cause.    Other Father        deceased.   Asthma Sister    Heart disease Sister        multiple stents.   Asthma Brother    Hypertension Brother    Asthma Son    Asthma Brother    Hypertension Brother     Social History   Tobacco Use   Smoking status: Never Smoker   Smokeless tobacco: Current User    Types: Snuff, Chew   Tobacco comment: Started chewing tobacco at 79 years old  Vaping Use   Vaping Use: Never used  Substance Use Topics   Alcohol use: No    Comment: quit drinking "over 2 yr ago" (10/11/2016)   Drug use: No    Home Medications Prior to Admission medications   Medication Sig Start Date End Date Taking? Authorizing Provider  albuterol (PROAIR HFA) 108 (90 Base) MCG/ACT inhaler Inhale 2 puffs into the lungs every 4 (four) hours as needed for wheezing. 05/24/18   Shirley, [place], DO  allopurinol (ZYLOPRIM) 100 MG tablet Take 0.5 tablets (50 mg total) by mouth daily. 12/23/19   Lilland, Alana, DO  bisoprolol (ZEBETA) 10 MG tablet TAKE 1 TABLET BY MOUTH EVERY DAY Patient taking differently: Take 10 mg by mouth daily.  02/25/19   Sherran Needs, NP  Cholecalciferol (VITAMIN D-3) 5000 units TABS Take 5,000 Units daily by mouth.     [provider]  dapagliflozin propanediol (FARXIGA) 10 MG TABS tablet Take 1 tablet (10 mg total) by mouth daily. 12/05/19   Leavy Cella, RPH-CPP  ferrous sulfate 325 (65 FE) MG tablet Take 1 tablet (325 mg total) by mouth every other day. 01/02/20   Lilland, Alana, DO  Fluticasone-Salmeterol (WIXELA INHUB) 500-50 MCG/DOSE AEPB INHALE 1 PUFF BY MOUTH TWICE A DAY 02/09/20   Lilland, Alana, DO  lactulose (CHRONULAC) 10 GM/15ML solution Take 15 mLs (10 g total) by mouth 2 (two) times daily as needed for mild constipation. (NEED TO ENSURE 2 SOFT BM IN A DAY.). 10/21/19   Shirley, [place], DO  montelukast (SINGULAIR) 10 MG tablet Take 1 tablet (10 mg total) by mouth at bedtime. 02/04/20   Lilland, Alana, DO  Nutritional Supplements (ENSURE  COMPLETE SHAKE) LIQD Take 1 Container by mouth daily. Patient taking differently: Take 1 Container 2 (two) times daily by mouth.  03/04/15   Katheren Shams, DO  spironolactone (ALDACTONE) 50 MG tablet Take 1 tablet (50 mg total) by mouth daily. 11/08/19   Croitoru, Mihai, MD  Fluticasone-Salmeterol (WIXELA INHUB) 500-50 MCG/DOSE AEPB Inhale 1 puff into the lungs 2 (two) times daily. 10/27/17   Shirley, [place], DO  furosemide (LASIX) 40 MG tablet TAKE 1 TAB 3 TIMES A DAY FOR WEIGHT >168LB 2  TIMES A DAY FOR WEIGHT 163-168LB ONCE DAILY FOR WEIGHT <163LB 12/24/18 07/20/19  Shirley, [place], DO  montelukast (SINGULAIR) 10 MG tablet TAKE 1 TABLET BY MOUTH EVERYDAY AT BEDTIME 07/27/18   Enid Derry, [place], DO  spironolactone (ALDACTONE) 25 MG tablet TAKE 1 TABLET BY MOUTH EVERY DAY 10/17/18   Shirley, [place], DO    Allergies    Patient has no known allergies.  Review of Systems   Review of Systems  Gastrointestinal: Positive for abdominal pain, nausea and vomiting.  All other systems reviewed and are negative.   Physical Exam Updated Vital Signs BP (!) 210/65 (BP Location: Right Arm)    Pulse (!) 111    Temp 97.8 F (36.6 C) (Oral)    Resp (!) 22    Ht 5' 10.5" (1.791 m)    Wt 74.8 kg    SpO2 100%    BMI 23.34 kg/m   Physical Exam Vitals and nursing note reviewed.  Constitutional:      Appearance: He is well-developed.  HENT:     Head: Normocephalic and atraumatic.  Eyes:     Conjunctiva/sclera: Conjunctivae normal.     Pupils: Pupils are equal, round, and reactive to light.  Cardiovascular:     Rate and Rhythm: Normal rate and regular rhythm.     Heart sounds: Normal heart sounds.  Pulmonary:     Effort: Pulmonary effort is normal.     Breath sounds: Normal breath sounds.  Abdominal:     General: Bowel sounds are normal.     Palpations: Abdomen is soft.     Tenderness: There is abdominal tenderness in the epigastric area. There is no right CVA tenderness or rebound.  Musculoskeletal:         General: Normal range of motion.     Cervical back: Normal range of motion.  Skin:    General: Skin is warm and dry.  Neurological:     Mental Status: He is alert and oriented to person, place, and time.     ED Results / Procedures / Treatments   Labs (all labs ordered are listed, but only abnormal results are displayed) Labs Reviewed  LIPASE, BLOOD - Abnormal; Notable for the following components:      Result Value   Lipase 62 (*)    All other components within normal limits  COMPREHENSIVE METABOLIC PANEL - Abnormal; Notable for the following components:   Glucose, Bld 110 (*)    Creatinine, Ser 1.89 (*)    Total Bilirubin 1.3 (*)    GFR, Estimated 33 (*)    All other components within normal limits  CBC - Abnormal; Notable for the following components:   RBC 3.74 (*)    Hemoglobin 12.9 (*)    HCT 38.8 (*)    MCV 103.7 (*)    MCH 34.5 (*)    Platelets 145 (*)    All other components within normal limits  URINALYSIS, ROUTINE W REFLEX MICROSCOPIC - Abnormal; Notable for the following components:   Glucose, UA >=500 (*)    Hgb urine dipstick SMALL (*)    Protein, ur >=300 (*)    All other components within normal limits  RESP PANEL BY RT PCR (RSV, FLU A&B, COVID)    EKG None  Radiology CT ABDOMEN PELVIS WO CONTRAST  Result Date: 03/04/2020 CLINICAL DATA:  Epigastric pain radiating to the back. History of cirrhosis. EXAM: CT ABDOMEN AND PELVIS WITHOUT CONTRAST TECHNIQUE: Multidetector CT imaging of the abdomen and pelvis was performed following  the standard protocol without IV contrast. COMPARISON:  01/23/2020 and 08/28/2019 FINDINGS: Lower chest: Prominent pleural fat. Linear fibrosis or atelectasis in the lung bases. Mild cardiac enlargement. Hepatobiliary: Tips shunt is present. Cirrhotic configuration to the liver. No focal lesions. Cholelithiasis with multiple stones in the gallbladder. No gallbladder wall thickening. No bile duct dilatation. Pancreas: Unremarkable.  No pancreatic ductal dilatation or surrounding inflammatory changes. Spleen: Normal in size without focal abnormality. Adrenals/Urinary Tract: Adrenal glands are unremarkable. Kidneys are normal, without renal calculi, focal lesion, or hydronephrosis. Bladder is unremarkable. Stomach/Bowel: Stomach, small bowel, and colon are not abnormally distended. Stool fills the colon. No wall thickening or inflammatory changes. Appendix is normal. Vascular/Lymphatic: Aortic atherosclerosis. No enlarged abdominal or pelvic lymph nodes. Reproductive: Prostate gland is enlarged. Other: No free air or free fluid in the abdomen. Postoperative inguinal hernia repairs. Abdominal wall musculature appears intact. Musculoskeletal: Degenerative changes in the spine and hips. Curvilinear calcification and lucency in the superior femoral heads bilaterally may represent avascular necrosis. IMPRESSION: 1. No acute process demonstrated in the abdomen or pelvis. No evidence of bowel obstruction or inflammation. 2. Cirrhotic configuration of the liver. Tips shunt is present. 3. Cholelithiasis without evidence of cholecystitis. 4. Enlarged prostate gland. 5. Bilateral femoral head avascular necrosis. Aortic Atherosclerosis (ICD10-I70.0). Electronically Signed   By: Lucienne Capers M.D.   On: 03/04/2020 01:17    Procedures Procedures (including critical care time)  Medications Ordered in ED Medications  iohexol (OMNIPAQUE) 9 MG/ML oral solution (has no administration in time range)  fentaNYL (SUBLIMAZE) injection 50 mcg (50 mcg Intravenous Given 03/03/20 2254)  ondansetron (ZOFRAN) injection 4 mg (4 mg Intravenous Given 03/03/20 2253)  sodium chloride 0.9 % bolus 500 mL (500 mLs Intravenous New Bag/Given 03/03/20 2255)    ED Course  I have reviewed the triage vital signs and the nursing notes.  Pertinent labs & imaging results that were available during my care of the patient were reviewed by me and considered in my medical  decision making (see chart for details).    MDM Rules/Calculators/A&P  79 year old male presenting to the ED with abdominal pain.  States this began last night after eating a steak.  States pain is in his upper abdomen and into the back.  He did have nausea and vomiting x1.  He is afebrile and nontoxic.  He is actually sleeping when I enter the room for exam but is still complaining of pain.  States he still feels nauseous and still has some pain.  Minimal tenderness in the epigastrium.  Does have history of cirrhosis with TIPS procedure.  Labs as above, creatinine appears around baseline but no significant transaminitis.  Bili is minimally elevated.  Lipase is 79 year old.  Given his history, will proceed with CT scan.  CT with no acute findings.  TIPS shunt is present without any complicating features.  Patient continues resting comfortably.  His blood pressure remains elevated but this appears around his baseline given prior visits.  Feel patient stable for discharge home given reassuring work-up here.  We will have him follow-up with his GI physician.  Rx Zofran.  May return here for any new or acute changes.  Final Clinical Impression(s) / ED Diagnoses Final diagnoses:  Epigastric abdominal pain    Rx / DC Orders ED Discharge Orders         Ordered    ondansetron (ZOFRAN ODT) 4 MG disintegrating tablet  Every 8 hours PRN        03/04/20 0148  Larene Pickett, PA-C 03/04/20 0232    Drenda Freeze, MD 03/04/20 [phone]

## 2020-03-04 ENCOUNTER — Emergency Department (HOSPITAL_COMMUNITY): Payer: Medicare Other

## 2020-03-04 DIAGNOSIS — K7460 Unspecified cirrhosis of liver: Secondary | ICD-10-CM | POA: Diagnosis not present

## 2020-03-04 DIAGNOSIS — K8020 Calculus of gallbladder without cholecystitis without obstruction: Secondary | ICD-10-CM | POA: Diagnosis not present

## 2020-03-04 DIAGNOSIS — M8789 Other osteonecrosis, multiple sites: Secondary | ICD-10-CM | POA: Diagnosis not present

## 2020-03-04 LAB — RESP PANEL BY RT PCR (RSV, FLU A&B, COVID)
Influenza A by PCR: NEGATIVE
Influenza B by PCR: NEGATIVE
Respiratory Syncytial Virus by PCR: NEGATIVE
SARS Coronavirus 2 by RT PCR: NEGATIVE

## 2020-03-04 MED ORDER — ONDANSETRON 4 MG PO TBDP: 4.0000 mg | ORAL_TABLET | Freq: Three times a day (TID) | ORAL | 0 refills | Status: DC | PRN

## 2020-03-04 NOTE — Discharge Instructions (Addendum)
I have sent nausea medication to your pharmacy that you can use if needed. Follow-up with Dr. Paulita Fujita-- call his office to schedule appt. Return to the ED for new or worsening symptoms.

## 2020-03-06 DIAGNOSIS — K7460 Unspecified cirrhosis of liver: Secondary | ICD-10-CM | POA: Diagnosis not present

## 2020-03-06 DIAGNOSIS — R1013 Epigastric pain: Secondary | ICD-10-CM | POA: Diagnosis not present

## 2020-03-12 ENCOUNTER — Other Ambulatory Visit (HOSPITAL_COMMUNITY): Payer: Self-pay | Admitting: Nurse Practitioner

## 2020-03-17 ENCOUNTER — Other Ambulatory Visit: Payer: Self-pay

## 2020-03-17 ENCOUNTER — Ambulatory Visit (INDEPENDENT_AMBULATORY_CARE_PROVIDER_SITE_OTHER): Payer: Medicare Other | Admitting: Podiatry

## 2020-03-17 ENCOUNTER — Encounter: Payer: Self-pay | Admitting: Podiatry

## 2020-03-17 DIAGNOSIS — B351 Tinea unguium: Secondary | ICD-10-CM | POA: Diagnosis not present

## 2020-03-17 DIAGNOSIS — N183 Chronic kidney disease, stage 3 unspecified: Secondary | ICD-10-CM

## 2020-03-17 NOTE — Progress Notes (Signed)
  Subjective:  Patient ID: Donald Moreno, male    DOB: Sep 20, 1940,  MRN: [id number]  No chief complaint on file.  79 y.o. male presents for nail care. CKD3. Denies new issues.  Objective:  Physical Exam: warm, good capillary refill, nail exam onychomycosis of the toenails, no trophic changes or ulcerative lesions. DP pulses palpable, PT pulses palpable and protective sensation intact Left Foot: Hammertoes  Right Foot: Hammertoes  No images are attached to the encounter.  Assessment:   1. Onychomycosis   2. Stage 3 chronic kidney disease, unspecified whether stage 3a or 3b CKD (Donald Moreno)      Plan:  Patient was evaluated and treated and all questions answered.  Onychomycosis, Callus, CKD -Patient is diabetic with a qualifying condition for at risk foot care.   Procedure: Nail Debridement Rationale: Patient meets criteria for routine foot care due to CKD Type of Debridement: manual, sharp debridement. Instrumentation: Nail nipper, rotary burr. Number of Nails: 10     No follow-ups on file.

## 2020-03-23 ENCOUNTER — Ambulatory Visit
Admission: RE | Admit: 2020-03-23 | Discharge: 2020-03-23 | Disposition: A | Discharge status: Home / Self Care | Payer: Medicare Other | Source: Ambulatory Visit | Attending: Interventional Radiology | Admitting: Interventional Radiology

## 2020-03-23 DIAGNOSIS — K7689 Other specified diseases of liver: Secondary | ICD-10-CM | POA: Diagnosis not present

## 2020-03-23 DIAGNOSIS — Z95828 Presence of other vascular implants and grafts: Secondary | ICD-10-CM

## 2020-03-25 ENCOUNTER — Ambulatory Visit
Admission: RE | Admit: 2020-03-25 | Discharge: 2020-03-25 | Disposition: A | Discharge status: Home / Self Care | Payer: Medicare Other | Source: Ambulatory Visit | Attending: Interventional Radiology | Admitting: Interventional Radiology

## 2020-03-25 ENCOUNTER — Other Ambulatory Visit: Payer: Self-pay

## 2020-03-25 ENCOUNTER — Ambulatory Visit (INDEPENDENT_AMBULATORY_CARE_PROVIDER_SITE_OTHER): Payer: Medicare Other

## 2020-03-25 DIAGNOSIS — Z23 Encounter for immunization: Secondary | ICD-10-CM

## 2020-03-25 DIAGNOSIS — Z95828 Presence of other vascular implants and grafts: Secondary | ICD-10-CM

## 2020-03-25 NOTE — Progress Notes (Signed)
   Covid-19 Vaccination Clinic  Name:  Donald Moreno    MRN: [id number] DOB: April 26, 1941  03/25/2020   Patient presents to nurse clinic for [place] booster. Answers no to all screening questions, verified that second dose was at least 6 months ago. Administered in LD, site unremarkable, tolerated injection well.   [name] was observed post Covid-19 immunization for 15 minutes without incident. He was provided with Vaccine Information Sheet and instruction to access the V-Safe system.   [name] was instructed to call 911 with any severe reactions post vaccine: Marland Kitchen Difficulty breathing  . Swelling of face and throat  . A fast heartbeat  . A bad rash all over body  . Dizziness and weakness    Provided patient with updated immunization record and card.   [name], RN

## 2020-03-30 ENCOUNTER — Encounter: Payer: Self-pay | Admitting: *Deleted

## 2020-03-30 ENCOUNTER — Other Ambulatory Visit: Payer: Self-pay

## 2020-03-30 ENCOUNTER — Ambulatory Visit
Admission: RE | Admit: 2020-03-30 | Discharge: 2020-03-30 | Disposition: A | Discharge status: Home / Self Care | Payer: Medicare Other | Source: Ambulatory Visit | Attending: Interventional Radiology | Admitting: Interventional Radiology

## 2020-03-30 HISTORY — PX: IR RADIOLOGIST EVAL & MGMT: IMG5224

## 2020-04-08 ENCOUNTER — Other Ambulatory Visit: Payer: Self-pay | Admitting: Gastroenterology

## 2020-04-08 DIAGNOSIS — K7460 Unspecified cirrhosis of liver: Secondary | ICD-10-CM

## 2020-04-09 ENCOUNTER — Other Ambulatory Visit: Payer: Self-pay | Admitting: Family Medicine

## 2020-04-09 DIAGNOSIS — M109 Gout, unspecified: Secondary | ICD-10-CM

## 2020-04-09 DIAGNOSIS — D539 Nutritional anemia, unspecified: Secondary | ICD-10-CM

## 2020-04-28 ENCOUNTER — Other Ambulatory Visit: Payer: Self-pay | Admitting: Family Medicine

## 2020-04-28 ENCOUNTER — Ambulatory Visit
Admission: RE | Admit: 2020-04-28 | Discharge: 2020-04-28 | Disposition: A | Discharge status: Home / Self Care | Payer: Medicare Other | Source: Ambulatory Visit | Attending: Gastroenterology | Admitting: Gastroenterology

## 2020-04-28 DIAGNOSIS — K7460 Unspecified cirrhosis of liver: Secondary | ICD-10-CM

## 2020-05-18 DIAGNOSIS — K7460 Unspecified cirrhosis of liver: Secondary | ICD-10-CM | POA: Diagnosis not present

## 2020-05-18 DIAGNOSIS — R1013 Epigastric pain: Secondary | ICD-10-CM | POA: Diagnosis not present

## 2020-05-18 DIAGNOSIS — R188 Other ascites: Secondary | ICD-10-CM | POA: Diagnosis not present

## 2020-06-19 ENCOUNTER — Other Ambulatory Visit: Payer: Self-pay

## 2020-06-19 ENCOUNTER — Ambulatory Visit (INDEPENDENT_AMBULATORY_CARE_PROVIDER_SITE_OTHER): Payer: Medicare Other | Admitting: Podiatry

## 2020-06-19 DIAGNOSIS — N183 Chronic kidney disease, stage 3 unspecified: Secondary | ICD-10-CM

## 2020-06-19 DIAGNOSIS — B351 Tinea unguium: Secondary | ICD-10-CM

## 2020-06-19 NOTE — Progress Notes (Signed)
  Subjective:  Patient ID: Donald Moreno, male    DOB: 10/07/1940,  MRN: [id number]  Chief Complaint  Patient presents with  . debridement    BL nails and callus care -pt not diabetic pt not on BT   80 y.o. male presents for nail care. CKD3. Denies new issues.  Objective:  Physical Exam: warm, good capillary refill, nail exam onychomycosis of the toenails, no trophic changes or ulcerative lesions. DP pulses palpable, PT pulses palpable and protective sensation intact Left Foot: Hammertoes  Right Foot: Hammertoes  No images are attached to the encounter.  Assessment:   1. Onychomycosis   2. Stage 3 chronic kidney disease, unspecified whether stage 3a or 3b CKD ([place])    Plan:  Patient was evaluated and treated and all questions answered.  Onychomycosis, Callus, CKD -Patient is diabetic with a qualifying condition for at risk foot care.  Procedure: Nail Debridement Type of Debridement: manual, sharp debridement. Instrumentation: Nail nipper, rotary burr. Number of Nails: 10  Return in about 3 months (around 09/16/2020) for At risk foot care.

## 2020-07-02 ENCOUNTER — Ambulatory Visit (INDEPENDENT_AMBULATORY_CARE_PROVIDER_SITE_OTHER): Payer: Medicare Other | Admitting: Family Medicine

## 2020-07-02 ENCOUNTER — Other Ambulatory Visit: Payer: Self-pay

## 2020-07-02 VITALS — BP 131/90 | HR 114 | Ht 70.5 in | Wt 157.2 lb

## 2020-07-02 DIAGNOSIS — M87052 Idiopathic aseptic necrosis of left femur: Secondary | ICD-10-CM | POA: Insufficient documentation

## 2020-07-02 MED ORDER — TRAMADOL HCL 50 MG PO TABS: 50.0000 mg | ORAL_TABLET | Freq: Three times a day (TID) | ORAL | 0 refills | Status: AC | PRN

## 2020-07-02 NOTE — Progress Notes (Signed)
    SUBJECTIVE:   CHIEF COMPLAINT / HPI:   Left Hip/Knee Pain Worsening over the last month Took a trip to [place] and was in pain so used his brother's cane which helped Not sure if comes from his knee up or hip down Hasn't noticed any knee or leg swelling No SOB Denies pain previously Worse in the morning when he wakes up Walks twice a day, and seems to improve throughout the day Hasn't been taking anything for pain CT abdomen and pelvis 03/04/2020 noted to have bilateral femoral head avascular necrosis with degenerative changes throughout hips and spine, AVN appears to have been documented since at least 2018  PERTINENT  PMH / PSH: Hypertension, A flutter, asthma, alcoholic cirrhosis, CKD 3, 2/2 hyperparathyroidism, gout, hx bradyacrdia, anemia  OBJECTIVE:   BP 131/90   Pulse (!) 114   Ht 5' 10.5" (1.791 m)   Wt 157 lb 3.2 oz (71.3 kg)   SpO2 98%   BMI 22.24 kg/m    Physical Exam:  General: 80 y.o. male in NAD. Ambulating with cane Lungs: Breathing comfortably on RA  Left Hip:  - Inspection: No gross deformity, no swelling, erythema, or ecchymosis, no swelling or TTP b/l calf, no TTP left knee - Palpation: No TTP, specifically none over greater trochanter - ROM: very decreased IR and ER of b/l hips L>R, with pain on Left, normal ROM at knee  - Strength: Normal strength b/l - Neuro/vasc: NV intact distally b/l - Special Tests:  Limited due to pain and mobility    ASSESSMENT/PLAN:   Avascular necrosis of bone of left hip (HCC) On examination, patient has very limited internal rotation of the hip and has pain with range of motion testing.  Personally reviewed his prior imaging and it appears that avascular necrosis of femoral heads are rather significant.  Given this and acute worsening of his pain with limited range of motion, as expect this to be the cause of his pain.  He will most likely benefit from orthopedic referral to decide if he is a surgical candidate versus  if they would be able to do ultrasound-guided femoral acetabular joint injection.  In the short-term, we will send him a 5-day course of tramadol, PMP reviewed, no red flags.  Discussed with Dr. Erin Moreno who agrees to the plan.  Referral placed for orthopedics.  Follow-up with PCP in 1 month to ensure that this is occurred.  Advised him to follow-up if he does not hear anything from them within the next 2 weeks.  Also discussed careful ambulation with cane in the meantime.     Donald Moreno, [name]

## 2020-07-02 NOTE — Patient Instructions (Signed)
Thank you for coming to see me today. It was a pleasure. Today we talked about:   I have placed a referral to Orthopedics for your hip.  If you do not hear from them in the next 2 weeks, please give [place] a call.  I have sent tramadol to the pharmacy for you to use as needed for your pain.  Only use this when your pain is severe.  Please follow-up with PCP in 1 month.  If you have any questions or concerns, please do not hesitate to call the office at [phone].  Best,   [place] Constable, DO

## 2020-07-02 NOTE — Assessment & Plan Note (Addendum)
On examination, patient has very limited internal rotation of the hip and has pain with range of motion testing.  Personally reviewed his prior imaging and it appears that avascular necrosis of femoral heads are rather significant.  Given this and acute worsening of his pain with limited range of motion, as expect this to be the cause of his pain.  He will most likely benefit from orthopedic referral to decide if he is a surgical candidate versus if they would be able to do ultrasound-guided femoral acetabular joint injection.  In the short-term, we will send him a 5-day course of tramadol, PMP reviewed, no red flags.  Discussed with Dr. Erin Hearing who agrees to the plan.  Referral placed for orthopedics.  Follow-up with PCP in 1 month to ensure that this is occurred.  Advised him to follow-up if he does not hear anything from them within the next 2 weeks.  Also discussed careful ambulation with cane in the meantime.

## 2020-07-14 ENCOUNTER — Encounter: Payer: Self-pay | Admitting: Orthopaedic Surgery

## 2020-07-14 ENCOUNTER — Ambulatory Visit (INDEPENDENT_AMBULATORY_CARE_PROVIDER_SITE_OTHER): Payer: Medicare Other | Admitting: Orthopaedic Surgery

## 2020-07-14 ENCOUNTER — Ambulatory Visit: Payer: Self-pay

## 2020-07-14 ENCOUNTER — Other Ambulatory Visit: Payer: Self-pay

## 2020-07-14 VITALS — Ht 71.0 in | Wt 161.0 lb

## 2020-07-14 DIAGNOSIS — M87052 Idiopathic aseptic necrosis of left femur: Secondary | ICD-10-CM

## 2020-07-14 DIAGNOSIS — M87051 Idiopathic aseptic necrosis of right femur: Secondary | ICD-10-CM

## 2020-07-14 NOTE — Progress Notes (Signed)
Office Visit Note   Patient: Donald Moreno           Date of Birth: 07/06/1940           MRN: [id number] Visit Date: 07/14/2020              Requested by: Lind Covert, MD [place],  [place] [id number] PCP: [name], DO   Assessment & Plan: Visit Diagnoses:  1. Avascular necrosis of bone of left hip ([place])   2. Avascular necrosis of bone of right hip (H[facility])     Plan: Impression is bilateral hip avascular necrosis worse on the left.  Patient is medically complex from a surgical risk standpoint.  Based on discussion of treatment options to include hip replacement and cortisone injection and medications patient is not interested in any interventions other than just pain medications.  He will live with this pain.  Follow-up as needed.  Follow-Up Instructions: Return if symptoms worsen or fail to improve.   Orders:  Orders Placed This Encounter  Procedures  . XR HIP UNILAT W OR W/O PELVIS 2-3 VIEWS RIGHT  . XR HIPS BILAT W OR W/O PELVIS 2V   No orders of the defined types were placed in this encounter.     Procedures: No procedures performed   Clinical Data: No additional findings.   Subjective: Chief Complaint  Patient presents with  . Left Hip - Pain    Donald Moreno is a 80 year old gentleman referral from PCPs office for mainly left hip and thigh pain for about 52months without any definite trauma.  Pain has improved some.  He walks with a cane for exercise.  He is poor historian.  He has a past history of heavy alcohol use but has been sober for about 6 years.  Currently lives with his niece.  He has pain with walking.   Review of Systems  Constitutional: Negative.   All other systems reviewed and are negative.    Objective: Vital Signs: Ht 5\' 11"  (1.803 m)   Wt 161 lb (73 kg)   BMI 22.45 kg/m   Physical Exam Vitals and nursing note reviewed.  Constitutional:      Appearance: He is well-developed and well-nourished.  HENT:      Head: Normocephalic and atraumatic.  Eyes:     Pupils: Pupils are equal, round, and reactive to light.  Pulmonary:     Effort: Pulmonary effort is normal.  Abdominal:     Palpations: Abdomen is soft.  Musculoskeletal:        General: Normal range of motion.     Cervical back: Neck supple.  Skin:    General: Skin is warm.  Neurological:     Mental Status: He is alert and oriented to person, place, and time.  Psychiatric:        Mood and Affect: Mood and affect normal.        Behavior: Behavior normal.        Thought Content: Thought content normal.        Judgment: Judgment normal.     Ortho Exam Left hip shows positive FADIR.  Antalgic gait.  No tenderness palpation. Right hip shows decent range of motion with mild pain.  No tenderness palpation. Specialty Comments:  No specialty comments available.  Imaging: XR HIPS BILAT W OR W/O PELVIS 2V  Result Date: 07/14/2020 Bilateral femoral head avascular necrosis worse on the left with collapse and degenerative changes    PMFS History:  Patient Active Problem List   Diagnosis Date Noted  . Avascular necrosis of bone of left hip ([place]) 07/02/2020  . Tinnitus 11/19/2019  . History of anemia due to CKD 02/20/2018  . Secondary hyperparathyroidism of renal origin ([place]) 02/20/2018  . Macrocytic anemia 04/20/2017  . Congestive heart failure ([place])   . Hematuria 01/30/2017  . Alcoholic cirrhosis of liver without ascites ([place])   . Typical atrial flutter ([place])   . Abdominal pain 07/01/2015  . CKD (chronic kidney disease) stage 3, GFR 30-59 ml/min ([facility]) 05/27/2015  . At risk for decreased bone density 03/04/2015  . Bradycardia 07/08/2013  . Advanced directives, counseling/discussion 01/03/2011  . Asthma 01/29/2010  . Gout 07/13/2006  . HYPERTENSION, BENIGN SYSTEMIC 07/13/2006  . Arthritis 07/13/2006   Past Medical History:  Diagnosis Date  . Acute congestive heart failure ([place])   . Arthritis    "all over"  . Asthma   .  Blood in stool 04/11/2017  . Chronic kidney disease    "I see a kidney dr @ [facility]" (10/11/2016)  . Chronic lower back pain    "turned a truck over a long time ago" (10/11/2016)  . Dysrhythmia    bradycardia with occassional junctional rhythm  . ETOH abuse   . Gout   . Hypertension   . Inguinal hernia 10/28/2014   S/P inguinal hernia repair with 4L as ascites removed 03/30/17  . Liver cirrhosis ([place]) 2015  . Macrocytosis   . Pneumonia    "long long time ago" (10/11/2016)  . Spontaneous bacterial peritonitis ([place]) 07/01/2015    Family History  Problem Relation Age of Onset  . Other Mother        died when pt was only 10 - unknown cause.  . Other Father        deceased.  . Asthma Sister   . Heart disease Sister        multiple stents.  . Asthma Brother   . Hypertension Brother   . Asthma Son   . Asthma Brother   . Hypertension Brother     Past Surgical History:  Procedure Laterality Date  . INGUINAL HERNIA REPAIR Bilateral 03/30/2017   Procedure: OPEN BILATERAL INGUINAL HERNIA REPAIR WITH MESH;  Surgeon: Kinsinger, Arta Bruce, MD;  Location: [facility];  Service: General;  Laterality: Bilateral;  GENERAL COMBINED WITH REGIONAL FOR POST OP PAIN   . INSERTION OF MESH N/A 10/16/2016   Procedure: INSERTION OF MESH;  Surgeon: Kinsinger, Arta Bruce, MD;  Location: [facility];  Service: General;  Laterality: N/A;  . INSERTION OF MESH Bilateral 03/30/2017   Procedure: INSERTION OF MESH;  Surgeon: Kieth Brightly Arta Bruce, MD;  Location: [facility];  Service: General;  Laterality: Bilateral;  GENERAL COMBINED WITH REGIONAL FOR POST OP PAIN   . IR PARACENTESIS  08/19/2016  . IR PARACENTESIS  08/26/2016  . IR PARACENTESIS  09/02/2016  . IR PARACENTESIS  09/09/2016  . IR PARACENTESIS  09/16/2016  . IR PARACENTESIS  10/03/2016  . IR PARACENTESIS  10/11/2016  . IR RADIOLOGIST EVAL & MGMT  09/29/2016  . IR RADIOLOGIST EVAL & MGMT  12/21/2016  . IR RADIOLOGIST EVAL & MGMT  01/23/2018  . IR RADIOLOGIST EVAL &  MGMT  02/19/2019  . IR RADIOLOGIST EVAL & MGMT  03/30/2020  . IR TIPS  10/11/2016  . RADIOLOGY WITH ANESTHESIA N/A 10/11/2016   Procedure: TIPS;  Surgeon: Arne Cleveland, MD;  Location: [facility];  Service: Radiology;  Laterality: N/A;  . UMBILICAL  HERNIA REPAIR N/A 10/16/2016   Procedure: HERNIA REPAIR UMBILICAL ADULT;  Surgeon: Kinsinger, Arta Bruce, MD;  Location: [facility];  Service: General;  Laterality: N/A;   Social History   Occupational History  . Occupation: Retired- truck Geophysical data processor  . Smoking status: Never Smoker  . Smokeless tobacco: Current User    Types: Snuff, Chew  . Tobacco comment: Started chewing tobacco at 80 years old  Vaping Use  . Vaping Use: Never used  Substance and Sexual Activity  . Alcohol use: No    Comment: quit drinking "over 2 yr ago" (10/11/2016)  . Drug use: No  . Sexual activity: Not Currently

## 2020-07-23 ENCOUNTER — Telehealth: Payer: Self-pay

## 2020-07-23 NOTE — Telephone Encounter (Signed)
Patient calls nurse line requesting refill on tramadol. Patient states that he had follow up appointment with orthopedic specialist, however, is electing not to proceed with surgery. Patient does not have any current pain medication and tramadol is not on current medication list.   Please advise.   [name], RN

## 2020-07-27 NOTE — Telephone Encounter (Signed)
Called patient regarding request for pain medications as he is electing to not proceed with surgery. Patient informed that he will either need to contact the orthopedic for medication or come into the office before I will prescribe them.  Shakeel Disney, DO

## 2020-08-03 ENCOUNTER — Other Ambulatory Visit: Payer: Self-pay | Admitting: Family Medicine

## 2020-08-03 DIAGNOSIS — J45909 Unspecified asthma, uncomplicated: Secondary | ICD-10-CM

## 2020-09-18 ENCOUNTER — Ambulatory Visit (INDEPENDENT_AMBULATORY_CARE_PROVIDER_SITE_OTHER): Payer: Medicare Other | Admitting: Podiatry

## 2020-09-18 DIAGNOSIS — Z5329 Procedure and treatment not carried out because of patient's decision for other reasons: Secondary | ICD-10-CM

## 2020-09-18 NOTE — Progress Notes (Signed)
No show for appt. 

## 2020-10-04 ENCOUNTER — Observation Stay (HOSPITAL_COMMUNITY)
Admission: EM | Admit: 2020-10-04 | Discharge: 2020-10-05 | Disposition: A | Discharge status: Home / Self Care | Payer: Medicare Other | Attending: Family Medicine | Admitting: Family Medicine

## 2020-10-04 ENCOUNTER — Encounter (HOSPITAL_COMMUNITY): Payer: Self-pay | Admitting: Emergency Medicine

## 2020-10-04 ENCOUNTER — Emergency Department (HOSPITAL_COMMUNITY): Payer: Medicare Other

## 2020-10-04 ENCOUNTER — Observation Stay (HOSPITAL_BASED_OUTPATIENT_CLINIC_OR_DEPARTMENT_OTHER): Payer: Medicare Other

## 2020-10-04 ENCOUNTER — Other Ambulatory Visit: Payer: Self-pay

## 2020-10-04 DIAGNOSIS — N183 Chronic kidney disease, stage 3 unspecified: Secondary | ICD-10-CM | POA: Insufficient documentation

## 2020-10-04 DIAGNOSIS — R1084 Generalized abdominal pain: Secondary | ICD-10-CM | POA: Diagnosis not present

## 2020-10-04 DIAGNOSIS — R1013 Epigastric pain: Secondary | ICD-10-CM | POA: Diagnosis not present

## 2020-10-04 DIAGNOSIS — I509 Heart failure, unspecified: Secondary | ICD-10-CM | POA: Diagnosis not present

## 2020-10-04 DIAGNOSIS — Z20822 Contact with and (suspected) exposure to covid-19: Secondary | ICD-10-CM | POA: Insufficient documentation

## 2020-10-04 DIAGNOSIS — Z79899 Other long term (current) drug therapy: Secondary | ICD-10-CM | POA: Diagnosis not present

## 2020-10-04 DIAGNOSIS — K7030 Alcoholic cirrhosis of liver without ascites: Secondary | ICD-10-CM | POA: Diagnosis not present

## 2020-10-04 DIAGNOSIS — I361 Nonrheumatic tricuspid (valve) insufficiency: Secondary | ICD-10-CM | POA: Diagnosis not present

## 2020-10-04 DIAGNOSIS — R079 Chest pain, unspecified: Secondary | ICD-10-CM | POA: Insufficient documentation

## 2020-10-04 DIAGNOSIS — M87052 Idiopathic aseptic necrosis of left femur: Secondary | ICD-10-CM | POA: Diagnosis not present

## 2020-10-04 DIAGNOSIS — R109 Unspecified abdominal pain: Secondary | ICD-10-CM | POA: Diagnosis not present

## 2020-10-04 DIAGNOSIS — I340 Nonrheumatic mitral (valve) insufficiency: Secondary | ICD-10-CM | POA: Diagnosis not present

## 2020-10-04 DIAGNOSIS — R9431 Abnormal electrocardiogram [ECG] [EKG]: Secondary | ICD-10-CM

## 2020-10-04 DIAGNOSIS — I110 Hypertensive heart disease with heart failure: Secondary | ICD-10-CM | POA: Diagnosis not present

## 2020-10-04 DIAGNOSIS — N281 Cyst of kidney, acquired: Secondary | ICD-10-CM | POA: Diagnosis not present

## 2020-10-04 DIAGNOSIS — R001 Bradycardia, unspecified: Secondary | ICD-10-CM | POA: Diagnosis not present

## 2020-10-04 DIAGNOSIS — I130 Hypertensive heart and chronic kidney disease with heart failure and stage 1 through stage 4 chronic kidney disease, or unspecified chronic kidney disease: Secondary | ICD-10-CM | POA: Diagnosis not present

## 2020-10-04 DIAGNOSIS — J45909 Unspecified asthma, uncomplicated: Secondary | ICD-10-CM | POA: Insufficient documentation

## 2020-10-04 DIAGNOSIS — I517 Cardiomegaly: Secondary | ICD-10-CM | POA: Diagnosis not present

## 2020-10-04 DIAGNOSIS — I10 Essential (primary) hypertension: Secondary | ICD-10-CM | POA: Diagnosis not present

## 2020-10-04 DIAGNOSIS — Z7189 Other specified counseling: Secondary | ICD-10-CM

## 2020-10-04 DIAGNOSIS — R1110 Vomiting, unspecified: Secondary | ICD-10-CM | POA: Diagnosis not present

## 2020-10-04 DIAGNOSIS — S2231XA Fracture of one rib, right side, initial encounter for closed fracture: Secondary | ICD-10-CM | POA: Diagnosis not present

## 2020-10-04 DIAGNOSIS — I5030 Unspecified diastolic (congestive) heart failure: Secondary | ICD-10-CM | POA: Diagnosis not present

## 2020-10-04 DIAGNOSIS — K8020 Calculus of gallbladder without cholecystitis without obstruction: Secondary | ICD-10-CM | POA: Diagnosis not present

## 2020-10-04 DIAGNOSIS — M1000 Idiopathic gout, unspecified site: Secondary | ICD-10-CM

## 2020-10-04 DIAGNOSIS — J4520 Mild intermittent asthma, uncomplicated: Secondary | ICD-10-CM

## 2020-10-04 DIAGNOSIS — K5900 Constipation, unspecified: Secondary | ICD-10-CM | POA: Diagnosis not present

## 2020-10-04 LAB — PROTIME-INR
INR: 1.2 (ref 0.8–1.2)
Prothrombin Time: 15.4 seconds — ABNORMAL HIGH (ref 11.4–15.2)

## 2020-10-04 LAB — URINALYSIS, ROUTINE W REFLEX MICROSCOPIC
Bacteria, UA: NONE SEEN
Bilirubin Urine: NEGATIVE
Glucose, UA: >=500 mg/dL — AB
Hgb urine dipstick: NEGATIVE
Ketones, ur: NEGATIVE mg/dL
Leukocytes,Ua: NEGATIVE
Nitrite: NEGATIVE
Protein, ur: 30 mg/dL — AB
Specific Gravity, Urine: 1.016 (ref 1.005–1.030)
pH: 7 (ref 5.0–8.0)

## 2020-10-04 LAB — HEPATIC FUNCTION PANEL
ALT: 27 U/L (ref 0–44)
AST: 49 U/L — ABNORMAL HIGH (ref 15–41)
Albumin: 3.3 g/dL — ABNORMAL LOW (ref 3.5–5.0)
Alkaline Phosphatase: 57 U/L (ref 38–126)
Bilirubin, Direct: 0.6 mg/dL — ABNORMAL HIGH (ref 0.0–0.2)
Indirect Bilirubin: 0.6 mg/dL (ref 0.3–0.9)
Total Bilirubin: 1.2 mg/dL (ref 0.3–1.2)
Total Protein: 7.3 g/dL (ref 6.5–8.1)

## 2020-10-04 LAB — CBC
HCT: 38.3 % — ABNORMAL LOW (ref 39.0–52.0)
Hemoglobin: 12.9 g/dL — ABNORMAL LOW (ref 13.0–17.0)
MCH: 35.4 pg — ABNORMAL HIGH (ref 26.0–34.0)
MCHC: 33.7 g/dL (ref 30.0–36.0)
MCV: 105.2 fL — ABNORMAL HIGH (ref 80.0–100.0)
Platelets: 121 10*3/uL — ABNORMAL LOW (ref 150–400)
RBC: 3.64 MIL/uL — ABNORMAL LOW (ref 4.22–5.81)
RDW: 12.1 % (ref 11.5–15.5)
WBC: 7.7 10*3/uL (ref 4.0–10.5)
nRBC: 0 % (ref 0.0–0.2)

## 2020-10-04 LAB — LIPASE, BLOOD: Lipase: 39 U/L (ref 11–51)

## 2020-10-04 LAB — MAGNESIUM: Magnesium: 1.8 mg/dL (ref 1.7–2.4)

## 2020-10-04 LAB — APTT: aPTT: 30 seconds (ref 24–36)

## 2020-10-04 LAB — BASIC METABOLIC PANEL
Anion gap: 7 (ref 5–15)
BUN: 28 mg/dL — ABNORMAL HIGH (ref 8–23)
CO2: 23 mmol/L (ref 22–32)
Calcium: 9.7 mg/dL (ref 8.9–10.3)
Chloride: 107 mmol/L (ref 98–111)
Creatinine, Ser: 2.02 mg/dL — ABNORMAL HIGH (ref 0.61–1.24)
GFR, Estimated: 33 mL/min — ABNORMAL LOW (ref >60)
Glucose, Bld: 114 mg/dL — ABNORMAL HIGH (ref 70–99)
Potassium: 4.4 mmol/L (ref 3.5–5.1)
Sodium: 137 mmol/L (ref 135–145)

## 2020-10-04 LAB — LIPID PANEL
Cholesterol: 143 mg/dL (ref 0–200)
HDL: 60 mg/dL (ref >40)
LDL Cholesterol: 78 mg/dL (ref 0–99)
Total CHOL/HDL Ratio: 2.4 RATIO
Triglycerides: 23 mg/dL (ref <150)
VLDL: 5 mg/dL (ref 0–40)

## 2020-10-04 LAB — ECHOCARDIOGRAM COMPLETE
Height: 70 in
S' Lateral: 3.8 cm
Weight: 2752 oz

## 2020-10-04 LAB — TSH: TSH: 1.846 u[IU]/mL (ref 0.350–4.500)

## 2020-10-04 LAB — TROPONIN I (HIGH SENSITIVITY): Troponin I (High Sensitivity): 12 ng/L (ref <18)

## 2020-10-04 LAB — SARS CORONAVIRUS 2 (TAT 6-24 HRS): SARS Coronavirus 2: NEGATIVE

## 2020-10-04 LAB — BRAIN NATRIURETIC PEPTIDE: B Natriuretic Peptide: 477.8 pg/mL — ABNORMAL HIGH (ref 0.0–100.0)

## 2020-10-04 LAB — HEMOGLOBIN A1C
Hgb A1c MFr Bld: 5.3 % (ref 4.8–5.6)
Mean Plasma Glucose: 105.41 mg/dL

## 2020-10-04 MED ORDER — MOMETASONE FURO-FORMOTEROL FUM 200-5 MCG/ACT IN AERO
2.0000 | INHALATION_SPRAY | Freq: Two times a day (BID) | RESPIRATORY_TRACT | Status: DC
Start: 2020-10-04 — End: 2020-10-05
  Administered 2020-10-04 – 2020-10-05 (×2): 2 via RESPIRATORY_TRACT
  Filled 2020-10-04: qty 8.8

## 2020-10-04 MED ORDER — ALLOPURINOL 100 MG PO TABS
50.0000 mg | ORAL_TABLET | Freq: Every day | ORAL | Status: DC
  Administered 2020-10-05: 50 mg via ORAL
  Filled 2020-10-04: qty 1

## 2020-10-04 MED ORDER — FENTANYL CITRATE (PF) 100 MCG/2ML IJ SOLN
12.5000 ug | Freq: Once | INTRAMUSCULAR | Status: AC
  Administered 2020-10-04: 12.5 ug via INTRAVENOUS
  Filled 2020-10-04: qty 2

## 2020-10-04 MED ORDER — FENTANYL CITRATE (PF) 100 MCG/2ML IJ SOLN
75.0000 ug | Freq: Once | INTRAMUSCULAR | Status: AC
Start: 2020-10-04 — End: 2020-10-04
  Administered 2020-10-04: 75 ug via INTRAVENOUS
  Filled 2020-10-04: qty 2

## 2020-10-04 MED ORDER — SPIRONOLACTONE 25 MG PO TABS
50.0000 mg | ORAL_TABLET | Freq: Every day | ORAL | Status: DC
  Administered 2020-10-05: 50 mg via ORAL
  Filled 2020-10-04: qty 2

## 2020-10-04 MED ORDER — ONDANSETRON HCL 4 MG/2ML IJ SOLN
4.0000 mg | Freq: Once | INTRAMUSCULAR | Status: AC
  Administered 2020-10-04: 4 mg via INTRAVENOUS
  Filled 2020-10-04: qty 2

## 2020-10-04 MED ORDER — DAPAGLIFLOZIN PROPANEDIOL 10 MG PO TABS
10.0000 mg | ORAL_TABLET | Freq: Every day | ORAL | Status: DC
  Administered 2020-10-05: 10 mg via ORAL
  Filled 2020-10-04 (×2): qty 1

## 2020-10-04 MED ORDER — MORPHINE SULFATE (PF) 2 MG/ML IV SOLN
2.0000 mg | Freq: Once | INTRAVENOUS | Status: AC
  Administered 2020-10-04: 2 mg via INTRAVENOUS
  Filled 2020-10-04: qty 1

## 2020-10-04 MED ORDER — MONTELUKAST SODIUM 10 MG PO TABS
10.0000 mg | ORAL_TABLET | Freq: Every day | ORAL | Status: DC
  Administered 2020-10-04: 10 mg via ORAL
  Filled 2020-10-04: qty 1

## 2020-10-04 MED ORDER — ENOXAPARIN SODIUM 40 MG/0.4ML IJ SOSY: 40.0000 mg | PREFILLED_SYRINGE | INTRAMUSCULAR | Status: DC

## 2020-10-04 MED ORDER — LACTULOSE 10 GM/15ML PO SOLN: 10.0000 g | Freq: Two times a day (BID) | ORAL | Status: DC | PRN

## 2020-10-04 MED ORDER — ALBUTEROL SULFATE (2.5 MG/3ML) 0.083% IN NEBU: 2.5000 mg | INHALATION_SOLUTION | RESPIRATORY_TRACT | Status: DC | PRN

## 2020-10-04 MED ORDER — VITAMIN D 25 MCG (1000 UNIT) PO TABS
5000.0000 [IU] | ORAL_TABLET | Freq: Every day | ORAL | Status: DC
  Administered 2020-10-05: 5000 [IU] via ORAL
  Filled 2020-10-04: qty 5

## 2020-10-04 MED ORDER — BOOST / RESOURCE BREEZE PO LIQD CUSTOM
1.0000 | Freq: Two times a day (BID) | ORAL | Status: DC
  Administered 2020-10-05: 1 via ORAL

## 2020-10-04 MED ORDER — SODIUM CHLORIDE 0.9 % IV BOLUS
500.0000 mL | Freq: Once | INTRAVENOUS | Status: AC
  Administered 2020-10-04: 500 mL via INTRAVENOUS

## 2020-10-04 NOTE — ED Notes (Signed)
Patient transported to [facility] 

## 2020-10-04 NOTE — ED Provider Notes (Signed)
[facility] EMERGENCY DEPARTMENT Provider Note   CSN: [id number] Arrival date & time: 10/04/20  1607     History CC:  Nausea, abdominal pain  Donald Moreno is a 80 y.o. male w/ hx of A Flutter, etoh use, cirrhosis, CHF, presenting to ED with abdominal pain and back pain.  Onset yesterday evening, gradual, feels similar to episodes in the past.  He denies vomiting, reports nausea.  Reports BM this morning, but feels constipated overall.  Denies SOB, cough, congestion.  He denies to me is having chest pain or pressure.  With a note that he has a slow heart rate on arrival, he tells me "my heart rate can be very slow and then really fast, that happens all the time".  Patient is overall poor historian.  Per medical record review shows repeat emergency department visits for abdominal pain and similar presentation.  CT scan from Oct 2021 showing bilateral femoral head avascular necrosis, cirrhosis of liver s/p TIPs, enlarged prostate, and gallstones, along with constipation and stool filling the colon.  The patient subsequently had a cholecystectomy, with ultrasound in Dec 2021 showing removal of gallbladder.  HPI     Past Medical History:  Diagnosis Date  . Acute congestive heart failure ([place])   . Arthritis    "all over"  . Asthma   . Blood in stool 04/11/2017  . Chronic kidney disease    "I see a kidney dr @ [facility]" (10/11/2016)  . Chronic lower back pain    "turned a truck over a long time ago" (10/11/2016)  . Dysrhythmia    bradycardia with occassional junctional rhythm  . ETOH abuse   . Gout   . Hypertension   . Inguinal hernia 10/28/2014   S/P inguinal hernia repair with 4L as ascites removed 03/30/17  . Liver cirrhosis ([place]) 2015  . Macrocytosis   . Pneumonia    "long long time ago" (10/11/2016)  . Spontaneous bacterial peritonitis ([place]) 07/01/2015    Patient Active Problem List   Diagnosis Date Noted  . Avascular necrosis of bone of left hip  ([place]) 07/02/2020  . Tinnitus 11/19/2019  . History of anemia due to CKD 02/20/2018  . Secondary hyperparathyroidism of renal origin ([place]) 02/20/2018  . Macrocytic anemia 04/20/2017  . Congestive heart failure ([place])   . Hematuria 01/30/2017  . Alcoholic cirrhosis of liver without ascites ([place])   . Typical atrial flutter ([place])   . Abdominal pain 07/01/2015  . CKD (chronic kidney disease) stage 3, GFR 30-59 ml/min (HCC) 05/27/2015  . At risk for decreased bone density 03/04/2015  . Bradycardia 07/08/2013  . Advanced directives, counseling/discussion 01/03/2011  . Asthma 01/29/2010  . Gout 07/13/2006  . HYPERTENSION, BENIGN SYSTEMIC 07/13/2006  . Arthritis 07/13/2006    Past Surgical History:  Procedure Laterality Date  . INGUINAL HERNIA REPAIR Bilateral 03/30/2017   Procedure: OPEN BILATERAL INGUINAL HERNIA REPAIR WITH MESH;  Surgeon: Kinsinger, Arta Bruce, MD;  Location: [facility];  Service: General;  Laterality: Bilateral;  GENERAL COMBINED WITH REGIONAL FOR POST OP PAIN   . INSERTION OF MESH N/A 10/16/2016   Procedure: INSERTION OF MESH;  Surgeon: Kinsinger, Arta Bruce, MD;  Location: [place];  Service: General;  Laterality: N/A;  . INSERTION OF MESH Bilateral 03/30/2017   Procedure: INSERTION OF MESH;  Surgeon: Kieth Brightly Arta Bruce, MD;  Location: [facility];  Service: General;  Laterality: Bilateral;  GENERAL COMBINED WITH REGIONAL FOR POST OP PAIN   . IR PARACENTESIS  08/19/2016  .  IR PARACENTESIS  08/26/2016  . IR PARACENTESIS  09/02/2016  . IR PARACENTESIS  09/09/2016  . IR PARACENTESIS  09/16/2016  . IR PARACENTESIS  10/03/2016  . IR PARACENTESIS  10/11/2016  . IR RADIOLOGIST EVAL & MGMT  09/29/2016  . IR RADIOLOGIST EVAL & MGMT  12/21/2016  . IR RADIOLOGIST EVAL & MGMT  01/23/2018  . IR RADIOLOGIST EVAL & MGMT  02/19/2019  . IR RADIOLOGIST EVAL & MGMT  03/30/2020  . IR TIPS  10/11/2016  . RADIOLOGY WITH ANESTHESIA N/A 10/11/2016   Procedure: TIPS;  Surgeon: Arne Cleveland, MD;  Location: [place];  Service: Radiology;  Laterality: N/A;  . UMBILICAL HERNIA REPAIR N/A 10/16/2016   Procedure: HERNIA REPAIR UMBILICAL ADULT;  Surgeon: Kinsinger, Arta Bruce, MD;  Location: [place];  Service: General;  Laterality: N/A;       Family History  Problem Relation Age of Onset  . Other Mother        died when pt was only 44 - unknown cause.  . Other Father        deceased.  . Asthma Sister   . Heart disease Sister        multiple stents.  . Asthma Brother   . Hypertension Brother   . Asthma Son   . Asthma Brother   . Hypertension Brother     Social History   Tobacco Use  . Smoking status: Never Smoker  . Smokeless tobacco: Current User    Types: Snuff, Chew  . Tobacco comment: Started chewing tobacco at 80 years old  Vaping Use  . Vaping Use: Never used  Substance Use Topics  . Alcohol use: No    Comment: quit drinking "over 2 yr ago" (10/11/2016)  . Drug use: No    Home Medications Prior to Admission medications   Medication Sig Start Date End Date Taking? Authorizing Provider  albuterol (PROAIR HFA) 108 (90 Base) MCG/ACT inhaler Inhale 2 puffs into the lungs every 4 (four) hours as needed for wheezing. 05/24/18  Yes Enid Derry, [place], DO  allopurinol (ZYLOPRIM) 100 MG tablet TAKE 1/2 TABLET BY MOUTH DAILY Patient taking differently: Take 50 mg by mouth daily. 04/13/20  Yes Lilland, Alana, DO  bisoprolol (ZEBETA) 10 MG tablet TAKE 1 TABLET BY MOUTH EVERY DAY Patient taking differently: Take 10 mg by mouth daily. 03/12/20  Yes Croitoru, Mihai, MD  Cholecalciferol (VITAMIN D-3) 5000 units TABS Take 5,000 Units daily by mouth.    Yes [provider]  dapagliflozin propanediol (FARXIGA) 10 MG TABS tablet Take 1 tablet (10 mg total) by mouth daily. 12/05/19  Yes Leavy Cella, RPH-CPP  ferrous sulfate 325 (65 FE) MG tablet TAKE 1 TABLET BY MOUTH EVERY OTHER DAY Patient taking differently: Take 325 mg by mouth daily with breakfast. 04/13/20  Yes Lilland, Alana, DO   montelukast (SINGULAIR) 10 MG tablet TAKE 1 TABLET BY MOUTH EVERYDAY AT BEDTIME Patient taking differently: Take 10 mg by mouth at bedtime. 04/28/20  Yes Lilland, Alana, DO  Nutritional Supplements (ENSURE COMPLETE SHAKE) LIQD Take 1 Container by mouth daily. Patient taking differently: Take 1 Container by mouth 2 (two) times daily. 03/04/15  Yes Phelps, Aviva Signs Y, DO  ondansetron (ZOFRAN ODT) 4 MG disintegrating tablet Take 1 tablet (4 mg total) by mouth every 8 (eight) hours as needed for nausea. 03/04/20  Yes Larene Pickett, PA-C  spironolactone (ALDACTONE) 50 MG tablet Take 1 tablet (50 mg total) by mouth daily. 11/08/19  Yes Croitoru, Dani Gobble, MD  WIXELA INHUB 500-50 MCG/DOSE AEPB TAKE 1 PUFF BY MOUTH TWICE A DAY Patient taking differently: Inhale 1 puff into the lungs in the morning and at bedtime. 08/03/20  Yes Lilland, Alana, DO  lactulose (CHRONULAC) 10 GM/15ML solution Take 15 mLs (10 g total) by mouth 2 (two) times daily as needed for mild constipation. (NEED TO ENSURE 2 SOFT BM IN A DAY.). Patient not taking: No sig reported 10/21/19   Shirley, [place], DO  furosemide (LASIX) 40 MG tablet TAKE 1 TAB 3 TIMES A DAY FOR WEIGHT >168LB 2 TIMES A DAY FOR WEIGHT 163-168LB ONCE DAILY FOR WEIGHT <163LB 12/24/18 07/20/19  Shirley, [place], DO    Allergies    Patient has no known allergies.  Review of Systems   Review of Systems  Constitutional: Negative for chills and fever.  HENT: Negative for ear pain and sore throat.   Eyes: Negative for pain and visual disturbance.  Respiratory: Negative for cough and shortness of breath.   Cardiovascular: Negative for chest pain and palpitations.  Gastrointestinal: Positive for abdominal pain, constipation, nausea and vomiting. Negative for diarrhea.  Genitourinary: Negative for dysuria and hematuria.  Musculoskeletal: Negative for arthralgias and back pain.  Skin: Negative for color change and rash.  Neurological: Negative for syncope and headaches.  All  other systems reviewed and are negative.   Physical Exam Updated Vital Signs BP 132/83 (BP Location: Left Arm)   Pulse 74   Temp 98.5 F (36.9 C) (Oral)   Resp 15   Ht 5\' 10"  (1.778 m)   Wt 78 kg   SpO2 95%   BMI 24.68 kg/m   Physical Exam Constitutional:      General: He is not in acute distress. HENT:     Head: Normocephalic and atraumatic.  Eyes:     Conjunctiva/sclera: Conjunctivae normal.     Pupils: Pupils are equal, round, and reactive to light.  Cardiovascular:     Rate and Rhythm: Regular rhythm. Bradycardia present.     Comments: HR 32-35 bpm, flutter pattern Pulmonary:     Effort: Pulmonary effort is normal. No respiratory distress.  Abdominal:     General: There is no distension.     Comments: Fluid wave abdomin, no focal tenderness  Skin:    General: Skin is warm and dry.  Neurological:     General: No focal deficit present.     Mental Status: He is alert and oriented to person, place, and time. Mental status is at baseline.     ED Results / Procedures / Treatments   Labs (all labs ordered are listed, but only abnormal results are displayed) Labs Reviewed  BASIC METABOLIC PANEL - Abnormal; Notable for the following components:      Result Value   Glucose, Bld 114 (*)    BUN 28 (*)    Creatinine, Ser 2.02 (*)    GFR, Estimated 33 (*)    All other components within normal limits  CBC - Abnormal; Notable for the following components:   RBC 3.64 (*)    Hemoglobin 12.9 (*)    HCT 38.3 (*)    MCV 105.2 (*)    MCH 35.4 (*)    Platelets 121 (*)    All other components within normal limits  HEPATIC FUNCTION PANEL - Abnormal; Notable for the following components:   Albumin 3.3 (*)    AST 49 (*)    Bilirubin, Direct 0.6 (*)    All other components within normal limits  URINALYSIS, ROUTINE  W REFLEX MICROSCOPIC - Abnormal; Notable for the following components:   APPearance HAZY (*)    Glucose, UA >=500 (*)    Protein, ur 30 (*)    All other  components within normal limits  SARS CORONAVIRUS 2 (TAT 6-24 HRS)  LIPASE, BLOOD  MAGNESIUM  TSH  PROTIME-INR  APTT  BRAIN NATRIURETIC PEPTIDE  HEMOGLOBIN A1C  LIPID PANEL  CBC  COMPREHENSIVE METABOLIC PANEL  TROPONIN I (HIGH SENSITIVITY)    EKG EKG Interpretation  Date/Time:  "Sunday Oct 04 2020 08:23:10 EDT Ventricular Rate:  44 PR Interval:    QRS Duration: 105 QT Interval:  583 QTC Calculation: 499 R Axis:   62 Text Interpretation: Atrial flutter Abnormal R-wave progression, early transition Borderline prolonged QT interval Confirmed by ,  ([id number]) on 10/04/2020 8:32:41 AM   Radiology CT ABDOMEN PELVIS WO CONTRAST  Result Date: 10/04/2020 CLINICAL DATA:  Diverticulitis suspected. Pain, vomiting, and constipation. Evaluate for small bowel obstruction. EXAM: CT ABDOMEN AND PELVIS WITHOUT CONTRAST TECHNIQUE: Multidetector CT imaging of the abdomen and pelvis was performed following the standard protocol without IV contrast. COMPARISON:  March 04, 2020 FINDINGS: Lower chest: Scattered subsegmental atelectasis in the lung bases. Marked gynecomastia. No other abnormalities in the lower chest. Hepatobiliary: A tips shunt is seen in the liver, unchanged. A low-attenuation lesion in the central liver on series 3, image 16 is likely a small cysts, too small to completely characterize. No other liver lesions identified on this unenhanced study. Cholelithiasis is noted. Pancreas: Unremarkable. No pancreatic ductal dilatation or surrounding inflammatory changes. Spleen: Normal in size without focal abnormality. Adrenals/Urinary Tract: Adrenal glands are normal. A cyst is seen in the lower pole of the right kidney. No suspicious renal masses. No hydronephrosis or stones. Mild bilateral perinephric stranding is stable and nonacute. The ureters and bladder are normal. Stomach/Bowel: The stomach and small bowel are normal. Only 1 or 2 tiny colonic diverticuli are identified. No  evidence of diverticulitis. The colon is otherwise unremarkable. The appendix is normal in appearance. Vascular/Lymphatic: Calcified atherosclerosis is seen in the nonaneurysmal abdominal aorta. No adenopathy. Reproductive: Prostate is unremarkable. Other: No free air free fluid. Musculoskeletal: AVN is seen in the femoral heads bilaterally. There is some collapse on the left which is stable. IMPRESSION: 1. No acute abnormality identified to explain the patient's symptoms. No diverticulitis. 2. AVN in the femoral heads bilaterally. There is some collapse on the left which is stable. 3. Calcified atherosclerosis in the abdominal aorta. 4. Marked gynecomastia. 5. A tips shunt remains in the liver. Electronically Signed   By: Donald  Williams Moreno M.D   On: 10/04/2020 09:54   DG Chest 2 View  Result Date: 10/04/2020 CLINICAL DATA:  Chest pain. EXAM: CHEST - 2 VIEW COMPARISON:  August 28, 2019 FINDINGS: There is a skin fold over the right apex. No pneumothorax. Stable cardiomegaly. The hila and mediastinum are unremarkable. Healed right rib fractures noted. Scarring in the right mid lung is stable. No focal infiltrate or overt edema. IMPRESSION: 1. Chronic changes in the lungs, particularly the right, are stable. No other acute interval changes. Electronically Signed   By: Donald  Williams Moreno M.D   On: 10/04/2020 07:04   ECHOCARDIOGRAM COMPLETE  Result Date: 10/04/2020    ECHOCARDIOGRAM REPORT   Patient Name:   Danton Carruthers Date of Exam: 10/04/2020 Medical Rec #:  [id number]      Height:       70" .0 in Accession #:    [id number]  Weight:       172.0 lb Date of Birth:  [birth year]       BSA:          1.958 m Patient Age:    38 years       BP:           167/121 mmHg Patient Gender: M              HR:           59 bpm. Exam Location:  Inpatient Procedure: 2D Echo Indications:    abnormal ecg  History:        Patient has prior history of Echocardiogram examinations, most                 recent 04/12/2017. CHF, chronic  kidney disease,                 Arrythmias:Atrial Flutter and bradycardia; Risk                 Factors:Hypertension.  Sonographer:    Johny Chess Referring Phys: [id number] [place]  1. Left ventricular ejection fraction, by estimation, is 55 to 60%. The left ventricle has normal function. The left ventricle has no regional wall motion abnormalities. Left ventricular diastolic function could not be evaluated.  2. Right ventricular systolic function is normal. The right ventricular size is normal. There is moderately elevated pulmonary artery systolic pressure. The estimated right ventricular systolic pressure is 76.2 mmHg.  3. Left atrial size was mildly dilated.  4. The mitral valve is degenerative. Mild mitral valve regurgitation. No evidence of mitral stenosis.  5. The aortic valve is tricuspid. Aortic valve regurgitation is not visualized. No aortic stenosis is present.  6. The inferior vena cava is dilated in size with >50% respiratory variability, suggesting right atrial pressure of 8 mmHg. FINDINGS  Left Ventricle: Left ventricular ejection fraction, by estimation, is 55 to 60%. The left ventricle has normal function. The left ventricle has no regional wall motion abnormalities. The left ventricular internal cavity size was normal in size. There is  no left ventricular hypertrophy. Left ventricular diastolic function could not be evaluated due to atrial fibrillation. Left ventricular diastolic function could not be evaluated. Right Ventricle: The right ventricular size is normal. No increase in right ventricular wall thickness. Right ventricular systolic function is normal. There is moderately elevated pulmonary artery systolic pressure. The tricuspid regurgitant velocity is 3.52 m/s, and with an assumed right atrial pressure of 8 mmHg, the estimated right ventricular systolic pressure is 83.1 mmHg. Left Atrium: Left atrial size was mildly dilated. Right Atrium: Right atrial size was  normal in size. Pericardium: There is no evidence of pericardial effusion. Mitral Valve: The mitral valve is degenerative in appearance. Mild mitral valve regurgitation. No evidence of mitral valve stenosis. Tricuspid Valve: The tricuspid valve is grossly normal. Tricuspid valve regurgitation is mild . No evidence of tricuspid stenosis. Aortic Valve: The aortic valve is tricuspid. Aortic valve regurgitation is not visualized. No aortic stenosis is present. Pulmonic Valve: The pulmonic valve was grossly normal. Pulmonic valve regurgitation is not visualized. No evidence of pulmonic stenosis. Aorta: The aortic root and ascending aorta are structurally normal, with no evidence of dilitation. Venous: The inferior vena cava is dilated in size with greater than 50% respiratory variability, suggesting right atrial pressure of 8 mmHg. IAS/Shunts: The atrial septum is grossly normal.  LEFT VENTRICLE PLAX 2D LVIDd:  5.20 cm LVIDs:         3.80 cm LV PW:         1.10 cm LV IVS:        1.10 cm LVOT diam:     2.10 cm LV SV:         62 LV SV Index:   31 LVOT Area:     3.46 cm  RIGHT VENTRICLE         IVC TAPSE (M-mode): 1.6 cm  IVC diam: 2.60 cm LEFT ATRIUM             Index       RIGHT ATRIUM           Index LA diam:        3.90 cm 1.99 cm/m  RA Area:     18.40 cm LA Vol (A2C):   83.5 ml 42.65 ml/m RA Volume:   51.20 ml  26.15 ml/m LA Vol (A4C):   62.5 ml 31.92 ml/m LA Biplane Vol: 72.7 ml 37.13 ml/m  AORTIC VALVE LVOT Vmax:   96.10 cm/s LVOT Vmean:  64.300 cm/s LVOT VTI:    0.178 m  AORTA Ao Root diam: 3.10 cm Ao Asc diam:  3.50 cm TRICUSPID VALVE TR Peak grad:   49.6 mmHg TR Vmax:        352.00 cm/s  SHUNTS Systemic VTI:  0.18 m Systemic Diam: 2.10 cm Donald Chiquito MD Electronically signed by Donald Chiquito MD Signature Date/Time: 10/04/2020/5:34:50 PM    Final    US Abdomen Limited RUQ (LIVER/GB)  Result Date: 10/04/2020 CLINICAL DATA:  Abdominal pain. EXAM: ULTRASOUND ABDOMEN LIMITED RIGHT UPPER QUADRANT  COMPARISON:  CT, 10/04/2020 and ultrasound, 04/28/2020. FINDINGS: Gallbladder: 1 cm dependent gallstone. No gallbladder wall thickening. No pericholecystic fluid or sonographic Murphy's sign. Common bile duct: Diameter: 4 mm Liver: Coarse echotexture. Surface nodularity. No mass. Patient has a tips stent, patent by color Doppler analysis. Portal vein is patent on color Doppler imaging with normal direction of blood flow towards the liver. Other: None. IMPRESSION: 1. No acute findings. 2. Cholelithiasis without evidence of acute cholecystitis. 3. Liver findings consistent with cirrhosis. Electronically Signed   By: Lajean Manes M.D.   On: 10/04/2020 10:55    Procedures Procedures   Medications Ordered in ED Medications  allopurinol (ZYLOPRIM) tablet 50 mg (has no administration in time range)  spironolactone (ALDACTONE) tablet 50 mg (has no administration in time range)  lactulose (CHRONULAC) 10 GM/15ML solution 10 g (has no administration in time range)  cholecalciferol (VITAMIN D3) tablet 5,000 Units (has no administration in time range)  feeding supplement (BOOST / RESOURCE BREEZE) liquid 1 Container (has no administration in time range)  albuterol (PROVENTIL) (2.5 MG/3ML) 0.083% nebulizer solution 2.5 mg (has no administration in time range)  montelukast (SINGULAIR) tablet 10 mg (has no administration in time range)  mometasone-formoterol (DULERA) 200-5 MCG/ACT inhaler 2 puff (has no administration in time range)  enoxaparin (LOVENOX) injection 40 mg (has no administration in time range)  dapagliflozin propanediol (FARXIGA) tablet 10 mg (has no administration in time range)  morphine 2 MG/ML injection 2 mg (2 mg Intravenous Given 10/04/20 0756)  sodium chloride 0.9 % bolus 500 mL (0 mLs Intravenous Stopped 10/04/20 0918)  ondansetron (ZOFRAN) injection 4 mg (4 mg Intravenous Given 10/04/20 0855)  fentaNYL (SUBLIMAZE) injection 75 mcg (75 mcg Intravenous Given 10/04/20 0933)    ED Course  I  have reviewed the triage vital signs and the nursing notes.  Pertinent labs & imaging results that were available during my care of the patient were reviewed by me and considered in my medical decision making (see chart for details).  This patient presents to the Emergency Department with complaint of abdominal pain. This involves an extensive number of treatment options, and is a complaint that carries with it a high risk of complications and morbidity.  The differential diagnosis includes, but is not limited to, constipation versus bowel obstruction versus pancreatitis versus gastritis versus UTI vs other.  This may be constipation issue.  [place] in September it is similar presentation in the ED, and had relief of his pain after bowel movement at home, per [facility] medicine note on 01/29/20.  He is afebrile, has no leukocytosis, minimal abdominal pain to suggest SBP at this time.  He has no history of AAA or smoking.  CT scan from October 2021 did not show any evidence of an abdominal aortic aneurysm.  The fact that he has had similar presentations in the past, lowers my suspicion for AAA.  Heart rate on arrival is bradycardic with a flutter pattern, heart rate ranging around 35 bpm.  He is on bisoprolol but it isn't clear if he's taking this medication at home.  I do not seen any anticoagulation listed in his medications.  I ordered, reviewed, and interpreted labs, which are near baseline level. I ordered medication IV morphine, IV zofran, IV fluids for abdominal pain and/or nausea I ordered imaging studies which included DG chest, ct abdomen/pelvis  I independently visualized and interpreted imaging which showed no acute processes in the chest, CT showing no acute infectious or inflammatory process, and the monitor tracing which showed A Flutter Previous records obtained and reviewed showing ED visit in Sept 2021 for similar abdominal pain. I personally reviewed the patients ECG which showed A  Flutter pattern (chronic) and stable  No evidence of AAA on imaging.  Given that his symptoms have occurred similarly in the past, I doubt AAA.  Clinically I have lower suspicion for mesenteric ischemia at this time.  Likewise I have lower suspicion for SBP with no leukocytosis, no fever, no significant abdominal tenderness.    Clinical Course as of 10/04/20 Mcarthur Rossetti Oct 04, 2020  8144 Patient an episode of vomiting.  Nursing reports that he was tachycardic, although when I reassessed telemetry monitor, I think it is erroneously reporting tachycardia when he is in fact persistent slow a flutter with occasional PVC. I have ordered some IV Zofran.  Suspect this may be related to intraabdominal issue [MT]  0919 Pt at CT now [MT]  1057 Patient continues to report abdominal pain, although improved after fentanyl, he still says that it is "really bad".  He is refusing to eat.  I placed a consult to the family practice that he follows with to come evaluate him  [MT]  1100 IMPRESSION: 1. No acute findings. 2. Cholelithiasis without evidence of acute cholecystitis. 3. Liver findings consistent with cirrhosis. [MT]  1118 Family practice team to come evaluate patient. [MT]  8185 Admitted by family medicine service [MT]    Clinical Course User Index [MT] Leylah Tarnow, Carola Rhine, MD    Final Clinical Impression(s) / ED Diagnoses Final diagnoses:  Abdominal pain  Avascular necrosis of bone of left hip (HCC)  Alcoholic cirrhosis of liver without ascites ([place])  HYPERTENSION, BENIGN SYSTEMIC  Idiopathic gout, unspecified chronicity, unspecified site  Mild intermittent asthma without complication  Advanced directives, counseling/discussion  Bradycardia  Diastolic congestive heart  failure, unspecified HF chronicity ([place])    Rx / DC Orders ED Discharge Orders    None       Admire Bunnell, Carola Rhine, MD 10/04/20 1759

## 2020-10-04 NOTE — ED Notes (Signed)
Pt transported to CT ?

## 2020-10-04 NOTE — ED Triage Notes (Signed)
Patient with chest pain and nausea that started last night.  Patient denies any shortness of breath at this time or vomiting.  Patient states that the chest pain moves into his abdomen.

## 2020-10-04 NOTE — ED Notes (Signed)
Pt vomiting. MD made aware.  

## 2020-10-04 NOTE — Progress Notes (Signed)
  Echocardiogram 2D Echocardiogram has been performed.  Donald Moreno 10/04/2020, 5:29 PM

## 2020-10-04 NOTE — H&P (Addendum)
[facility] Admission History and Physical Service Pager: [phone]  [name] Medical record number: [id number] Date of birth: [birth year] Age: 80 y.o. Gender: male  Primary Care Provider: [name], DO Consultants: None Code Status: DNR Preferred Emergency Contact: Kenyon Ana (niece) [phone]  Chief Complaint: Abdominal pain and Back pain  Assessment and Plan: [name] is a 80 y.o. male presenting with generalized abdominal and back pain . PMH is significant for A-flutter, alcohol use, cirrhosis, hx multiple paracentesis, improved after TIPS, HFrEF, avascular necrosis of hips bilaterally, anemia of chronic disease, secondary hyperparathyroidism of renal origin, macrocytic anemia, CKD stage III, bradycardia, asthma, gout, arthritis, COVID-19  Bradycardia I Hx Atrial flutter  Patient's HR in 8s now.  Denies any symptoms like dizziness, chest pain, discomfort.  Notes pain in bradycardia and tachycardia on and off multiple times in the past.  Troponins are flat, EKG shows atrial flutter with bradycardia.  On his previous admission patient had similar presentation and cardiology was following with recommendation of holding bisoprolol until HR's>50's and then restart at low-dose. Patient has history of atrial flutter with RVR that was very difficult to rate control, converted to NSR on amiodarone which was discontinued for junctional rhythm and after that started on bisoprolol. Cardiology from outpatient visit does not recommend anticoagulation due to advanced chronic liver disease.CHADSVasc2 (age, CHF rate related).   -- Admitted to [name], admitting provider Dr. Nori Riis --Telemetry monitoring -- Hold bisoprolol for now -- Vitals per floor -- EKG in am -- f/u BNP -- CBC and CMP in am -- f/u mag -- f/u TSH -- f/u ECHO -- PT eval and treat -- OT eval and treat -- Consult cardiology if needed, HR in 30's  Generalized abdominal pain I Hx  of cirrhosis with TIPS procedure Presenting with 9/10 sudden onset severe abdominal pain. Denies dysuria, flank pain.  Afebrile, leukocytosis, albumin 3.3, AST 49 direct bilirubin 0.6 . Chest X-ray does not show any acute abnormality.  U/S does not show any acute findings but shows cholelithiasis without evidence of acute cholecystitis and liver findings consistent with cirrhosis.  CT scan abdomen pelvis does not show any acute abnormality, no diverticulitis, AVN and femoral heads bilaterally and some collapse on the left which is stable, calcified atherosclerosis in abdominal aorta, mild gynecomastia and attempts patient remains in the liver.  We are thinking TIPS procedure adjustment vs constipation vs gastritis vs bowel obstruction vs renal stones vs SBP vs UTI.  Patient is afebrile, does not have any leukocytosis and minimal tenderness and does not suggest SBP at this time.Patient has had multiple presentations to ED for similar complaints of generalized abdominal pain with nausea and vomiting.  This admission he is s/p IV morphine, IV Zofran and bolus of IV fluid. -- Vitals per floor -- CBC and BMP in a.m. -- Monitor closely -- Continue Spironolactone 50 mg daily -- f/u HbA1c, APTT, Lipid panel, INR, TSH  Chronic HFpEF Last ECHO 2018 shows EF 60-65%.  Patient is clinically euvolemic.  Home meds: Spironolactone, Farxiga 10 mg, Bisoprolol .  He has not had any ascites since TIPS procedure. --Strict I's and O's --Daily weights --f/u Echo --Continue Farxiga 10 mg tomorrow a.m. --Hold bisoprolol  Cirrhosis I Hx of TIPS procedure (2018) Patient abstinent from alcohol.  No overt evidence of decompensation.  Advanced disease s/p TIPS procedure 5/18, previously had routine paracentesis but did not have any paracentesis since TIPS procedure. --c/w Lactulose 10 mg twice daily --Monitor closely for ascites and  volume overload  CKD stage III Scr~2.02, BUN 28.  Likely due to dehydration.Baseline  1.2-1.4 -- Avoid nephrotoxic agents -- BMP in a.m. -- Monitor closely  Avascular necrosis of bilateral femoral heads I Hyperparathyroidism Patient had an outpatient visit on 07/02/2020 for worsening of left hip knee pain when he took a trip to [place].  CT abdomen and pelvis on 10/21 noted to have bilateral femoral head avascular necrosis with degenerative changes throughout hips and spine, AVN appears to have been documented since at least 2018.  On physical exam no gross deformity, swelling, erythema or ecchymosis seen on lower extremities, range of motion normal, normal strength and patient denies any complaints, not using cane for exercise. --Follow-up outpatient with orthopedics --PT and OT eval and treat   Macrocytic anemia Hx macrocytic anemia.  Home meds: Ferrous sulfate, baseline~11-12. Hgb stable 12.9 --Monitor closely -- CBC in am  Asthma Home meds: Dulera, montelukast --Continue home meds   Gout Home meds: Allopurinol --Hold allopurinol for now  Protein malnutrition In the setting of chronic liver disease, albumin 3.3.  Home: Ensure shakes twice daily --Continue with Ensure shakes  FEN/GI: Clear liquid diet Prophylaxis: Lovenox 40  Disposition: Med telemetry  History of Present Illness:  Donald Moreno is a 80 y.o. male presenting with abdominal and back pain.  Patient was in his usual state of health until yesterday evening started having abdominal and back pain.  Notes having nausea but denied any episodes of vomiting.  He reports 1 bowel movement this morning.  He states he has had similar kind of episodes in past and rates his pain 8-9/10.  Points to her epigastrium and all over her lower abdomen and back. He states he took his medication yesterday at 7 AM and has not taken any medication today.  Denies any alcohol use, or any other drug use.  No chest pain, diarrhea, headache, throat pain, cough, sore throat.  Niece is called to obtain additional history.  She  states patient was doing well until this morning when he started having chest pain and then later on complaining of abdominal pain.  She asked him to present to ED because of concerning chest pain.  In ED patient was bradycardic with flutter pattern and HR ranging~35bpm.  Received IV morphine, IV Zofran and IV fluids for abdominal pain and nausea.  Had 1 episode of vomiting in ED.  Chest x-ray, CT abdomen pelvis and ultrasound abdomen does not show any acute abnormality.  EKG shows atrial flutter with bradycardia.   Review Of Systems: Per HPI   Review of Systems  Constitutional: Negative for appetite change, chills and fever.  Respiratory: Negative for cough, chest tightness and shortness of breath.   Cardiovascular: Positive for leg swelling. Negative for chest pain and palpitations.  Gastrointestinal: Positive for abdominal pain. Negative for blood in stool, constipation, diarrhea, nausea and vomiting.  Genitourinary: Negative for difficulty urinating, dysuria and hematuria.  Musculoskeletal: Positive for back pain.  Neurological: Negative for speech difficulty and headaches.  Hematological: Negative for adenopathy.  Psychiatric/Behavioral: Negative for confusion.  All other systems reviewed and are negative.    Patient Active Problem List   Diagnosis Date Noted  . Avascular necrosis of bone of left hip ([place]) 07/02/2020  . Tinnitus 11/19/2019  . History of anemia due to CKD 02/20/2018  . Secondary hyperparathyroidism of renal origin ([place]) 02/20/2018  . Macrocytic anemia 04/20/2017  . Congestive heart failure ([place])   . Hematuria 01/30/2017  . Alcoholic cirrhosis of liver without  ascites ([place])   . Typical atrial flutter ([place])   . Abdominal pain 07/01/2015  . CKD (chronic kidney disease) stage 3, GFR 30-59 ml/min ([facility]) 05/27/2015  . At risk for decreased bone density 03/04/2015  . Bradycardia 07/08/2013  . Advanced directives, counseling/discussion 01/03/2011  . Asthma 01/29/2010   . Gout 07/13/2006  . HYPERTENSION, BENIGN SYSTEMIC 07/13/2006  . Arthritis 07/13/2006    Past Medical History: Past Medical History:  Diagnosis Date  . Acute congestive heart failure ([place])   . Arthritis    "all over"  . Asthma   . Blood in stool 04/11/2017  . Chronic kidney disease    "I see a kidney dr @ [facility]" (10/11/2016)  . Chronic lower back pain    "turned a truck over a long time ago" (10/11/2016)  . Dysrhythmia    bradycardia with occassional junctional rhythm  . ETOH abuse   . Gout   . Hypertension   . Inguinal hernia 10/28/2014   S/P inguinal hernia repair with 4L as ascites removed 03/30/17  . Liver cirrhosis ([place]) 2015  . Macrocytosis   . Pneumonia    "long long time ago" (10/11/2016)  . Spontaneous bacterial peritonitis ([place]) 07/01/2015    Past Surgical History: Past Surgical History:  Procedure Laterality Date  . INGUINAL HERNIA REPAIR Bilateral 03/30/2017   Procedure: OPEN BILATERAL INGUINAL HERNIA REPAIR WITH MESH;  Surgeon: Kinsinger, Arta Bruce, MD;  Location: [facility];  Service: General;  Laterality: Bilateral;  GENERAL COMBINED WITH REGIONAL FOR POST OP PAIN   . INSERTION OF MESH N/A 10/16/2016   Procedure: INSERTION OF MESH;  Surgeon: Kinsinger, Arta Bruce, MD;  Location: [facility];  Service: General;  Laterality: N/A;  . INSERTION OF MESH Bilateral 03/30/2017   Procedure: INSERTION OF MESH;  Surgeon: Kieth Brightly Arta Bruce, MD;  Location: [facility];  Service: General;  Laterality: Bilateral;  GENERAL COMBINED WITH REGIONAL FOR POST OP PAIN   . IR PARACENTESIS  08/19/2016  . IR PARACENTESIS  08/26/2016  . IR PARACENTESIS  09/02/2016  . IR PARACENTESIS  09/09/2016  . IR PARACENTESIS  09/16/2016  . IR PARACENTESIS  10/03/2016  . IR PARACENTESIS  10/11/2016  . IR RADIOLOGIST EVAL & MGMT  09/29/2016  . IR RADIOLOGIST EVAL & MGMT  12/21/2016  . IR RADIOLOGIST EVAL & MGMT  01/23/2018  . IR RADIOLOGIST EVAL & MGMT  02/19/2019  . IR RADIOLOGIST EVAL & MGMT  03/30/2020  .  IR TIPS  10/11/2016  . RADIOLOGY WITH ANESTHESIA N/A 10/11/2016   Procedure: TIPS;  Surgeon: Arne Cleveland, MD;  Location: [facility];  Service: Radiology;  Laterality: N/A;  . UMBILICAL HERNIA REPAIR N/A 10/16/2016   Procedure: HERNIA REPAIR UMBILICAL ADULT;  Surgeon: Kinsinger, Arta Bruce, MD;  Location: [facility];  Service: General;  Laterality: N/A;    Social History: Social History   Tobacco Use  . Smoking status: Never Smoker  . Smokeless tobacco: Current User    Types: Snuff, Chew  . Tobacco comment: Started chewing tobacco at 80 years old  Vaping Use  . Vaping Use: Never used  Substance Use Topics  . Alcohol use: No    Comment: quit drinking "over 2 yr ago" (10/11/2016)  . Drug use: No   Additional social history: Lives with nieces and nephews. Please also refer to relevant sections of EMR.  Family History: Family History  Problem Relation Age of Onset  . Other Mother        died when pt was  only 18 - unknown cause.  . Other Father        deceased.  . Asthma Sister   . Heart disease Sister        multiple stents.  . Asthma Brother   . Hypertension Brother   . Asthma Son   . Asthma Brother   . Hypertension Brother    Allergies and Medications: No Known Allergies No current facility-administered medications on file prior to encounter.   Current Outpatient Medications on File Prior to Encounter  Medication Sig Dispense Refill  . albuterol (PROAIR HFA) 108 (90 Base) MCG/ACT inhaler Inhale 2 puffs into the lungs every 4 (four) hours as needed for wheezing. 1 Inhaler 2  . allopurinol (ZYLOPRIM) 100 MG tablet TAKE 1/2 TABLET BY MOUTH DAILY (Patient taking differently: Take 50 mg by mouth daily.) 45 tablet 1  . bisoprolol (ZEBETA) 10 MG tablet TAKE 1 TABLET BY MOUTH EVERY DAY (Patient taking differently: Take 10 mg by mouth daily.) 90 tablet 3  . Cholecalciferol (VITAMIN D-3) 5000 units TABS Take 5,000 Units daily by mouth.     . dapagliflozin propanediol (FARXIGA) 10 MG TABS  tablet Take 1 tablet (10 mg total) by mouth daily. 90 tablet 3  . ferrous sulfate 325 (65 FE) MG tablet TAKE 1 TABLET BY MOUTH EVERY OTHER DAY (Patient taking differently: Take 325 mg by mouth daily with breakfast.) 45 tablet 1  . montelukast (SINGULAIR) 10 MG tablet TAKE 1 TABLET BY MOUTH EVERYDAY AT BEDTIME (Patient taking differently: Take 10 mg by mouth at bedtime.) 90 tablet 1  . Nutritional Supplements (ENSURE COMPLETE SHAKE) LIQD Take 1 Container by mouth daily. (Patient taking differently: Take 1 Container by mouth 2 (two) times daily.) 237 mL 11  . ondansetron (ZOFRAN ODT) 4 MG disintegrating tablet Take 1 tablet (4 mg total) by mouth every 8 (eight) hours as needed for nausea. 10 tablet 0  . spironolactone (ALDACTONE) 50 MG tablet Take 1 tablet (50 mg total) by mouth daily. 90 tablet 3  . WIXELA INHUB 500-50 MCG/DOSE AEPB TAKE 1 PUFF BY MOUTH TWICE A DAY (Patient taking differently: Inhale 1 puff into the lungs in the morning and at bedtime.) 180 each 1  . lactulose (CHRONULAC) 10 GM/15ML solution Take 15 mLs (10 g total) by mouth 2 (two) times daily as needed for mild constipation. (NEED TO ENSURE 2 SOFT BM IN A DAY.). (Patient not taking: No sig reported) 1892 mL 1  . [DISCONTINUED] furosemide (LASIX) 40 MG tablet TAKE 1 TAB 3 TIMES A DAY FOR WEIGHT >168LB 2 TIMES A DAY FOR WEIGHT 163-168LB ONCE DAILY FOR WEIGHT <163LB 270 tablet 1    Objective: BP (!) 167/121   Pulse (!) 45   Temp 97.8 F (36.6 C) (Oral)   Resp 19   Ht 5\' 10"  (1.778 m)   Wt 172 lb (78 kg)   SpO2 97%   BMI 24.68 kg/m  Exam: General: Well-developed, well-nourished male lying in bed, no acute distress Eyes: Conjunctiva normal, pupils equal round and reactive to light Neck: No nodules palpated Cardiovascular: A flutter, bradycardia Respiratory: Clear to auscultation bilaterally Gastrointestinal: Soft, no distention, generalized tenderness MSK: Able to move all extremities well Derm: Warm and dry Neuro: Alert,  awake, oriented to time place and person. Psych: Normal mood and affect  Labs and Imaging: CBC BMET  Recent Labs  Lab 10/04/20 0647  WBC 7.7  HGB 12.9*  HCT 38.3*  PLT 121*   Recent Labs  Lab 10/04/20 [phone]  NA 137  K 4.4  CL 107  CO2 23  BUN 28*  CREATININE 2.02*  GLUCOSE 114*  CALCIUM 9.7     EKG: My own interpretation atrial flutter with bradycardia, QTC 499  Dagar, Meredith Staggers, MD 10/04/2020, 2:14 PM PGY-1, [facility] pager: [phone], text pages welcome   FPTS Upper-Level Resident Addendum   I have independently interviewed and examined the patient. I have discussed the above with the original author and agree with their documentation.  Please see also any attending notes.    Carollee Leitz MD PGY-2, [facility] 10/04/2020 5:03 PM  FPTS Service pager: [phone] (text pages welcome through [facility])

## 2020-10-04 NOTE — ED Notes (Signed)
Pt placed on cardiac pads and Dr Langston Masker at bedside.

## 2020-10-05 ENCOUNTER — Other Ambulatory Visit (HOSPITAL_COMMUNITY): Payer: Self-pay

## 2020-10-05 DIAGNOSIS — I5030 Unspecified diastolic (congestive) heart failure: Secondary | ICD-10-CM | POA: Diagnosis not present

## 2020-10-05 DIAGNOSIS — Z79899 Other long term (current) drug therapy: Secondary | ICD-10-CM | POA: Diagnosis not present

## 2020-10-05 DIAGNOSIS — M1000 Idiopathic gout, unspecified site: Secondary | ICD-10-CM | POA: Diagnosis not present

## 2020-10-05 DIAGNOSIS — Z20822 Contact with and (suspected) exposure to covid-19: Secondary | ICD-10-CM | POA: Diagnosis not present

## 2020-10-05 DIAGNOSIS — N183 Chronic kidney disease, stage 3 unspecified: Secondary | ICD-10-CM | POA: Diagnosis not present

## 2020-10-05 DIAGNOSIS — R079 Chest pain, unspecified: Secondary | ICD-10-CM | POA: Diagnosis not present

## 2020-10-05 DIAGNOSIS — I130 Hypertensive heart and chronic kidney disease with heart failure and stage 1 through stage 4 chronic kidney disease, or unspecified chronic kidney disease: Secondary | ICD-10-CM | POA: Diagnosis not present

## 2020-10-05 DIAGNOSIS — K7030 Alcoholic cirrhosis of liver without ascites: Secondary | ICD-10-CM | POA: Diagnosis not present

## 2020-10-05 DIAGNOSIS — R1084 Generalized abdominal pain: Secondary | ICD-10-CM | POA: Diagnosis not present

## 2020-10-05 DIAGNOSIS — M87052 Idiopathic aseptic necrosis of left femur: Secondary | ICD-10-CM | POA: Diagnosis not present

## 2020-10-05 DIAGNOSIS — J45909 Unspecified asthma, uncomplicated: Secondary | ICD-10-CM | POA: Diagnosis not present

## 2020-10-05 DIAGNOSIS — R001 Bradycardia, unspecified: Secondary | ICD-10-CM | POA: Diagnosis not present

## 2020-10-05 DIAGNOSIS — R1013 Epigastric pain: Secondary | ICD-10-CM | POA: Diagnosis not present

## 2020-10-05 DIAGNOSIS — I10 Essential (primary) hypertension: Secondary | ICD-10-CM | POA: Diagnosis not present

## 2020-10-05 LAB — COMPREHENSIVE METABOLIC PANEL
ALT: 20 U/L (ref 0–44)
AST: 27 U/L (ref 15–41)
Albumin: 3 g/dL — ABNORMAL LOW (ref 3.5–5.0)
Alkaline Phosphatase: 53 U/L (ref 38–126)
Anion gap: 6 (ref 5–15)
BUN: 29 mg/dL — ABNORMAL HIGH (ref 8–23)
CO2: 21 mmol/L — ABNORMAL LOW (ref 22–32)
Calcium: 9.2 mg/dL (ref 8.9–10.3)
Chloride: 107 mmol/L (ref 98–111)
Creatinine, Ser: 1.68 mg/dL — ABNORMAL HIGH (ref 0.61–1.24)
GFR, Estimated: 41 mL/min — ABNORMAL LOW (ref >60)
Glucose, Bld: 86 mg/dL (ref 70–99)
Potassium: 4.6 mmol/L (ref 3.5–5.1)
Sodium: 134 mmol/L — ABNORMAL LOW (ref 135–145)
Total Bilirubin: 2.3 mg/dL — ABNORMAL HIGH (ref 0.3–1.2)
Total Protein: 6.8 g/dL (ref 6.5–8.1)

## 2020-10-05 LAB — CBC
HCT: 37.7 % — ABNORMAL LOW (ref 39.0–52.0)
Hemoglobin: 12.6 g/dL — ABNORMAL LOW (ref 13.0–17.0)
MCH: 34.4 pg — ABNORMAL HIGH (ref 26.0–34.0)
MCHC: 33.4 g/dL (ref 30.0–36.0)
MCV: 103 fL — ABNORMAL HIGH (ref 80.0–100.0)
Platelets: 137 10*3/uL — ABNORMAL LOW (ref 150–400)
RBC: 3.66 MIL/uL — ABNORMAL LOW (ref 4.22–5.81)
RDW: 11.9 % (ref 11.5–15.5)
WBC: 8.8 10*3/uL (ref 4.0–10.5)
nRBC: 0 % (ref 0.0–0.2)

## 2020-10-05 MED ORDER — BISOPROLOL FUMARATE 5 MG PO TABS: 5.0000 mg | ORAL_TABLET | Freq: Every day | ORAL | 0 refills | Status: DC

## 2020-10-05 MED ORDER — BISOPROLOL FUMARATE 5 MG PO TABS
5.0000 mg | ORAL_TABLET | Freq: Every day | ORAL | 0 refills | Status: DC
  Filled 2020-10-05: qty 30, 30d supply, fill #0

## 2020-10-05 NOTE — Discharge Instructions (Signed)
Alcoholic Liver Disease  Alcoholic liver disease is liver damage that is caused by drinking a lot of alcohol for a long time. If you have this disease, you must stop drinking alcohol. What are the causes? This condition is caused by long-term (chronic) heavy alcohol use. What increases the risk? This condition is more likely to appear in:  Women.  People who are very overweight.  People who have a family history of this disease.  People who often drink large amounts of alcohol in a short amount of time (binge drink).  People who have another liver disease.  People who do not eat a healthy diet.  People who lack certain nutrients in their diet, such as folate or thiamine. What are the signs or symptoms? Most people do not have symptoms in the early stages of this disease. If early symptoms do appear, they may include:  Loss of appetite.  Feeling like you may vomit, or vomiting. Symptoms of moderate disease include:  Watery poop (diarrhea).  A fever.  Feeling tired (fatigue).  Weight loss.  The skin and the whites of the eyes turning yellow (jaundice).  Pain and swelling in the belly (abdomen). Symptoms of advanced disease include:  Weight loss and muscle loss.  Itchy skin.  Increase of fluid in the belly (ascites).  Swelling of the feet and ankles (edema).  Nosebleeds or bleeding gums.  Trouble concentrating.  Mood changes or confusion. How is this treated? The most important part of treatment is to stop drinking alcohol. You may also:  Take medicine to decrease symptoms caused by stopping alcohol use (withdrawal symptoms).  Enter a treatment program to help you stop drinking.  Join a support group.  Eat a healthy diet. This includes foods that have a lot of zinc or B vitamins in them.  Take vitamins.  Take medicines to decrease liver swelling (inflammation).  Get a donated liver (liver transplant). This is only done in very bad cases. It is  only for those who have stopped drinking and can commit to never drinking again. Follow these instructions at home:  Do not drink alcohol. Follow your treatment plan. Work with your doctor if you need help.  Think about joining an alcohol support group.  Take over-the-counter and prescription medicines only as told by your doctor. This includes vitamins.  Do not use medicines or eat foods that have alcohol in them, unless your doctor says that it is safe.  Follow instructions from your doctor about eating a healthy diet.  Keep all follow-up visits.   Where to find support  Alcoholics Anonymous (AA): [employer] Contact a doctor if:  You get a fever or chills.  Your skin starts to look more yellow, pale, or dark.  You get headaches. Get help right away if:  You vomit blood.  You have bright red blood in your poop (stool).  Your poop looks black or looks like tar.  You have trouble: ? Thinking. ? Walking. ? Balancing. ? Breathing. These symptoms may be an emergency. Get help right away. Call your local emergency services (911 in the [place]  Do not wait to see if the symptoms will go away.  Do not drive yourself to the hospital. Summary  Alcoholic liver disease is liver damage that is caused by drinking a lot of alcohol for a long time.  If you have this disease, you must stop drinking alcohol. Follow your treatment plan, and work with your doctor as needed.  Think about joining  an alcohol support group. This information is not intended to replace advice given to you by your health care provider. Make sure you discuss any questions you have with your health care provider. Document Revised: 02/13/2020 Document Reviewed: 02/13/2020 Elsevier Patient Education  2021 Elsevier Inc.   Dear Donald Moreno,  You presented to hospital with abdominal and back pain.  You were admitted to hospital due to your decreased heart rate.  You feel much better now and your heart rate is  back to normal.  -- Start taking 5 mg bisoprolol. --Before this admission you were taking 10 mg bisoprolol, do not take 10 mg but take 5 mg.  It was pleasure taking care of you! Dr. Demaris Callander

## 2020-10-05 NOTE — Evaluation (Signed)
Physical Therapy Evaluation Patient Details Name: Donald Moreno MRN: [id number] DOB: [birth year] Today's Date: 10/05/2020   History of Present Illness  Donald Moreno is a 80 y.o. male w/ hx of A Flutter, etoh use, cirrhosis, CHF, presenting to ED with abdominal pain and back pain.  Onset yesterday evening, gradual, feels similar to episodes in the past.  He denies vomiting, reports nausea.  Reports BM this morning, but feels constipated overall.  Denies SOB, cough, congestion.  He denies to me is having chest pain or pressure.  Clinical Impression  Patient very pleasant, reports he is feeling good today. No reports of pain. He is independent with bed mobility and transfers. Ambulated 300 feet in hallway with [facility] supervision. No lob or difficulty noted. Patient appears to be at baseline level of function. No further PT needs at this time. Will sign off.     Follow Up Recommendations No PT follow up    Equipment Recommendations  None recommended by PT    Recommendations for Other Services       Precautions / Restrictions Precautions Precautions: Fall Restrictions Weight Bearing Restrictions: No      Mobility  Bed Mobility Overal bed mobility: Independent                  Transfers Overall transfer level: Independent Equipment used: None                Ambulation/Gait Ambulation/Gait assistance: Supervision Gait Distance (Feet): 300 Feet Assistive device: Straight cane Gait Pattern/deviations: WFL(Within Functional Limits) Gait velocity: WNL   General Gait Details: patient demonstrates safe ambulation with cane.  Stairs            Wheelchair Mobility    Modified Rankin (Stroke Patients Only)       Balance Overall balance assessment: Modified Independent                                           Pertinent Vitals/Pain Pain Assessment: No/denies pain    Home Living Family/patient expects to be discharged to:: Private  residence Living Arrangements: Other relatives Available Help at Discharge: Family;Available 24 hours/day Type of Home: House Home Access: Level entry     Home Layout: One level Home Equipment: Walker - 2 wheels;Cane - single point;Wheelchair - manual      Prior Function Level of Independence: Independent with assistive device(s)         Comments: Uses cane when needed, walks to store. He does not drive.     Hand Dominance   Dominant Hand: Right    Extremity/Trunk Assessment   Upper Extremity Assessment Upper Extremity Assessment: Overall WFL for tasks assessed    Lower Extremity Assessment Lower Extremity Assessment: Overall WFL for tasks assessed    Cervical / Trunk Assessment Cervical / Trunk Assessment: Normal  Communication   Communication: No difficulties  Cognition Arousal/Alertness: Awake/alert Behavior During Therapy: WFL for tasks assessed/performed Overall Cognitive Status: Within Functional Limits for tasks assessed                                 General Comments: super friendly      General Comments      Exercises     Assessment/Plan    PT Assessment Patent does not need any further PT services  PT Problem List  PT Treatment Interventions      PT Goals (Current goals can be found in the Care Plan section)  Acute Rehab PT Goals Patient Stated Goal: patient is hopeful to go home this afternoon PT Goal Formulation: With patient Time For Goal Achievement: 10/06/20 Potential to Achieve Goals: Good    Frequency     Barriers to discharge        Co-evaluation               AM-PAC PT "6 Clicks" Mobility  Outcome Measure Help needed turning from your back to your side while in a flat bed without using bedrails?: None Help needed moving from lying on your back to sitting on the side of a flat bed without using bedrails?: None Help needed moving to and from a bed to a chair (including a wheelchair)?:  None Help needed standing up from a chair using your arms (e.g., wheelchair or bedside chair)?: None Help needed to walk in hospital room?: None Help needed climbing 3-5 steps with a railing? : None 6 Click Score: 24    End of Session   Activity Tolerance: Patient tolerated treatment well Patient left: in chair;with call bell/phone within reach Nurse Communication: Mobility status      Time: 6381-7711 PT Time Calculation (min) (ACUTE ONLY): 18 min   Charges:   PT Evaluation $PT Eval Moderate Complexity: 1 Mod PT Treatments $Gait Training: 8-22 mins        Brandt Chaney, PT, GCS 10/05/20,10:29 AM

## 2020-10-05 NOTE — Hospital Course (Signed)
Donald Moreno is a 80 y.o. male presenting with generalized abdominal and back pain and was under observation for ongoing bradycardia. PMH is significant for A-flutter, alcohol use, cirrhosis, hx multiple paracentesis, improved after TIPS, HFrEF, avascular necrosis of hips bilaterally, anemia of chronic disease, secondary hyperparathyroidism of renal origin, macrocytic anemia, CKD stage III, bradycardia, asthma, gout, arthritis, COVID-19   Bradycardia I Hx Atrial flutter  Patient's presents HR in 40s.  Denies any symptoms like dizziness, chest pain, discomfort.  Notes being in bradycardia and tachycardia on and off multiple times in the past.  Troponins are flat, EKG shows atrial flutter with bradycardia.  On his previous admission patient had similar presentation and cardiology was following with recommendation of holding bisoprolol until HR's>50's and then restart at low-dose. Patient has history of atrial flutter with RVR that was very difficult to rate control, converted to NSR on amiodarone which was discontinued for junctional rhythm and after that started on bisoprolol. Cardiology from outpatient visit does not recommend anticoagulation due to advanced chronic liver disease.CHADSVasc2 (age, CHF rate related).  He had improvement with HR's in the 70s-80s on discharge    Generalized abdominal pain I Hx of cirrhosis with TIPS procedure Presenting with 9/10 sudden onset severe abdominal pain. Denies dysuria, flank pain.  Afebrile, leukocytosis, albumin 3.3, AST 49 direct bilirubin 0.6 . Chest X-ray does not show any acute abnormality.  U/S does not show any acute findings but shows cholelithiasis without evidence of acute cholecystitis and liver findings consistent with cirrhosis.  CT scan abdomen pelvis does not show any acute abnormality, no diverticulitis, AVN and femoral heads bilaterally and some collapse on the left which is stable, calcified atherosclerosis in abdominal aorta, mild gynecomastia and  attempts patient remains in the liver.  We are thinking TIPS procedure adjustment vs constipation vs gastritis vs bowel obstruction vs renal stones vs SBP vs UTI.  Patient is afebrile, does not have any leukocytosis and minimal tenderness and does not suggest SBP at this time.Patient has had multiple presentations to ED for similar complaints of generalized abdominal pain with nausea and vomiting.  This admission he is s/p IV morphine, fentanyl V Zofran and bolus of IV fluid.  Received 1 extra dose of IV fentanyl denied any pain on morning of discharge  All other chronic conditions stable

## 2020-10-05 NOTE — Evaluation (Signed)
Occupational Therapy Evaluation Patient Details Name: Donald Moreno MRN: [id number] DOB: [birth year] Today's Date: 10/05/2020    History of Present Illness Donald Moreno is a 80 y.o. male w/ hx of A Flutter, etoh use, cirrhosis, CHF, presenting to ED with abdominal pain and back pain.  Onset yesterday evening, gradual, feels similar to episodes in the past.  He denies vomiting, reports nausea.  Reports BM this morning, but feels constipated overall.  Denies SOB, cough, congestion.  He denies to me is having chest pain or pressure.   Clinical Impression   Pt is at Ind baseline level of function and with ADLs and mobility. Pt very pleasant. All education completed and no further acute OT services are indicated at this time. OT will sign off    Follow Up Recommendations  No OT follow up    Equipment Recommendations  None recommended by OT    Recommendations for Other Services       Precautions / Restrictions Precautions Precautions: Fall Restrictions Weight Bearing Restrictions: No      Mobility Bed Mobility Overal bed mobility: Independent                  Transfers Overall transfer level: Independent Equipment used: None;Straight cane                  Balance Overall balance assessment: Modified Independent                                         ADL either performed or assessed with clinical judgement   ADL Overall ADL's : Independent                                             Vision Baseline Vision/History: Wears glasses Wears Glasses: At all times Patient Visual Report: No change from baseline       Perception     Praxis      Pertinent Vitals/Pain Pain Assessment: No/denies pain     Hand Dominance Right   Extremity/Trunk Assessment Upper Extremity Assessment Upper Extremity Assessment: Overall WFL for tasks assessed   Lower Extremity Assessment Lower Extremity Assessment: Defer to PT evaluation    Cervical / Trunk Assessment Cervical / Trunk Assessment: Normal   Communication Communication Communication: No difficulties   Cognition Arousal/Alertness: Awake/alert Behavior During Therapy: WFL for tasks assessed/performed Overall Cognitive Status: Within Functional Limits for tasks assessed                                 General Comments: super friendly   General Comments       Exercises     Shoulder Instructions      Home Living Family/patient expects to be discharged to:: Private residence Living Arrangements: Other relatives Available Help at Discharge: Family;Available 24 hours/day Type of Home: House Home Access: Level entry     Home Layout: One level     Bathroom Shower/Tub: Teacher, early years/pre: Standard     Home Equipment: Environmental consultant - 2 wheels;Cane - single point;Wheelchair - manual          Prior Functioning/Environment Level of Independence: Independent with assistive device(s)        Comments: Uses cane  when needed, walks to store. He does not drive. Ind with ADLs/selfcare, cooks        OT Problem List: Other (comment)      OT Treatment/Interventions:      OT Goals(Current goals can be found in the care plan section) Acute Rehab OT Goals Patient Stated Goal: patient is hopeful to go home this afternoon  OT Frequency:     Barriers to D/C:            Co-evaluation              AM-PAC OT "6 Clicks" Daily Activity     Outcome Measure Help from another person eating meals?: None Help from another person taking care of personal grooming?: None Help from another person toileting, which includes using toliet, bedpan, or urinal?: None Help from another person bathing (including washing, rinsing, drying)?: None Help from another person to put on and taking off regular upper body clothing?: None Help from another person to put on and taking off regular lower body clothing?: None 6 Click Score: 24   End of  Session Equipment Utilized During Treatment: Gait belt  Activity Tolerance: Patient tolerated treatment well Patient left: in chair;with call bell/phone within reach                   Time: 1014-1034 OT Time Calculation (min): 20 min Charges:  OT General Charges $OT Visit: 1 Visit OT Evaluation $OT Eval Low Complexity: 1 Low    Donald Moreno 10/05/2020, 1:34 PM

## 2020-10-05 NOTE — Discharge Summary (Signed)
[facility] Discharge Summary  [name] Medical record number: [id number] Date of birth: 11/22/1940 Age: 80 y.o. Gender: male Date of Admission: 10/04/2020  Date of Discharge: 10/05/2020 Admitting Physician: Honor Junes, MD  Primary Care Provider: [name], DO Consultants: None  Indication for Hospitalization: Bradycardia  Discharge Diagnoses/Problem List:  1.  Bradycardia I Hx atrial flutter 2.  Generalized abdominal pain I Hx of cirrhosis with TIPS procedure  Disposition: Home  Discharge Condition: Stable  Discharge Exam:  General: Pleasant elderly male lying comfortably in bed, NAD Cardiovascular: A-flutter, normal rate Abdomen: Soft, nondistended, nontender, bowel sounds present Extremities: No edema appreciated Pulmonary: Clear to auscultation bilaterally Psych: Good mood and affect  Brief Hospital Course:  Donald Moreno is a 80 y.o. male presenting with generalized abdominal and back pain and was under observation for ongoing bradycardia. PMH is significant for A-flutter, alcohol use, cirrhosis, hx multiple paracentesis, improved after TIPS, HFrEF, avascular necrosis of hips bilaterally, anemia of chronic disease, secondary hyperparathyroidism of renal origin, macrocytic anemia, CKD stage III, bradycardia, asthma, gout, arthritis, COVID-19   Bradycardia I Hx Atrial flutter  Patient's presents HR in 61s.  Denies any symptoms like dizziness, chest pain, discomfort.  Notes being in bradycardia and tachycardia on and off multiple times in the past.  Troponins are flat, EKG shows atrial flutter with bradycardia.  On his previous admission patient had similar presentation and cardiology was following with recommendation of holding bisoprolol until HR's>50's and then restart at low-dose. Patient has history of atrial flutter with RVR that was very difficult to rate control, converted to NSR on amiodarone which was discontinued for  junctional rhythm and after that started on bisoprolol. Cardiology from outpatient visit does not recommend anticoagulation due to advanced chronic liver disease.CHADSVasc2 (age, CHF rate related).  He had improvement with HR's in the 70s-80s on discharge    Generalized abdominal pain I Hx of cirrhosis with TIPS procedure Presenting with 9/10 sudden onset severe abdominal pain. Denies dysuria, flank pain.  Afebrile, leukocytosis, albumin 3.3, AST 49 direct bilirubin 0.6 . Chest X-ray does not show any acute abnormality.  U/S does not show any acute findings but shows cholelithiasis without evidence of acute cholecystitis and liver findings consistent with cirrhosis.  CT scan abdomen pelvis does not show any acute abnormality, no diverticulitis, AVN and femoral heads bilaterally and some collapse on the left which is stable, calcified atherosclerosis in abdominal aorta, mild gynecomastia and attempts patient remains in the liver.  We are thinking TIPS procedure adjustment vs constipation vs gastritis vs bowel obstruction vs renal stones vs SBP vs UTI.  Patient is afebrile, does not have any leukocytosis and minimal tenderness and does not suggest SBP at this time.Patient has had multiple presentations to ED for similar complaints of generalized abdominal pain with nausea and vomiting.  This admission he is s/p IV morphine, fentanyl V Zofran and bolus of IV fluid.  Received 1 extra dose of IV fentanyl denied any pain on morning of discharge  All other chronic conditions stable    Issues for Follow Up:  1. F/u with PCP in a week for physical 2. F/u with Cardio for Bradycardia 3. F/u with Gertie Fey for abdominal pain  Significant Procedures: Echocardiogram  ECHOCARDIOGRAM COMPLETE  Result Date: 10/04/2020    ECHOCARDIOGRAM REPORT   Patient Name:   Donald Moreno Date of Exam: 10/04/2020 Medical Rec #:  [id number]      Height:       70.0 in  Accession #:    [id number]     Weight:       172.0 lb Date of  Birth:  Jun 24, 1940       BSA:          1.958 m Patient Age:    85 years       BP:           167/121 mmHg Patient Gender: M              HR:           59 bpm. Exam Location:  Inpatient Procedure: 2D Echo Indications:    abnormal ecg  History:        Patient has prior history of Echocardiogram examinations, most                 recent 04/12/2017. CHF, chronic kidney disease,                 Arrythmias:Atrial Flutter and bradycardia; Risk                 Factors:Hypertension.  Sonographer:    Johny Chess Referring Phys: [id number] [place]  1. Left ventricular ejection fraction, by estimation, is 55 to 60%. The left ventricle has normal function. The left ventricle has no regional wall motion abnormalities. Left ventricular diastolic function could not be evaluated.  2. Right ventricular systolic function is normal. The right ventricular size is normal. There is moderately elevated pulmonary artery systolic pressure. The estimated right ventricular systolic pressure is 42.3 mmHg.  3. Left atrial size was mildly dilated.  4. The mitral valve is degenerative. Mild mitral valve regurgitation. No evidence of mitral stenosis.  5. The aortic valve is tricuspid. Aortic valve regurgitation is not visualized. No aortic stenosis is present.  6. The inferior vena cava is dilated in size with >50% respiratory variability, suggesting right atrial pressure of 8 mmHg. FINDINGS  Left Ventricle: Left ventricular ejection fraction, by estimation, is 55 to 60%. The left ventricle has normal function. The left ventricle has no regional wall motion abnormalities. The left ventricular internal cavity size was normal in size. There is  no left ventricular hypertrophy. Left ventricular diastolic function could not be evaluated due to atrial fibrillation. Left ventricular diastolic function could not be evaluated. Right Ventricle: The right ventricular size is normal. No increase in right ventricular wall thickness. Right  ventricular systolic function is normal. There is moderately elevated pulmonary artery systolic pressure. The tricuspid regurgitant velocity is 3.52 m/s, and with an assumed right atrial pressure of 8 mmHg, the estimated right ventricular systolic pressure is 53.6 mmHg. Left Atrium: Left atrial size was mildly dilated. Right Atrium: Right atrial size was normal in size. Pericardium: There is no evidence of pericardial effusion. Mitral Valve: The mitral valve is degenerative in appearance. Mild mitral valve regurgitation. No evidence of mitral valve stenosis. Tricuspid Valve: The tricuspid valve is grossly normal. Tricuspid valve regurgitation is mild . No evidence of tricuspid stenosis. Aortic Valve: The aortic valve is tricuspid. Aortic valve regurgitation is not visualized. No aortic stenosis is present. Pulmonic Valve: The pulmonic valve was grossly normal. Pulmonic valve regurgitation is not visualized. No evidence of pulmonic stenosis. Aorta: The aortic root and ascending aorta are structurally normal, with no evidence of dilitation. Venous: The inferior vena cava is dilated in size with greater than 50% respiratory variability, suggesting right atrial pressure of 8 mmHg. IAS/Shunts: The atrial septum is grossly normal.  LEFT VENTRICLE PLAX  2D LVIDd:         5.20 cm LVIDs:         3.80 cm LV PW:         1.10 cm LV IVS:        1.10 cm LVOT diam:     2.10 cm LV SV:         62 LV SV Index:   31 LVOT Area:     3.46 cm  RIGHT VENTRICLE         IVC TAPSE (M-mode): 1.6 cm  IVC diam: 2.60 cm LEFT ATRIUM             Index       RIGHT ATRIUM           Index LA diam:        3.90 cm 1.99 cm/m  RA Area:     18.40 cm LA Vol (A2C):   83.5 ml 42.65 ml/m RA Volume:   51.20 ml  26.15 ml/m LA Vol (A4C):   62.5 ml 31.92 ml/m LA Biplane Vol: 72.7 ml 37.13 ml/m  AORTIC VALVE LVOT Vmax:   96.10 cm/s LVOT Vmean:  64.300 cm/s LVOT VTI:    0.178 m  AORTA Ao Root diam: 3.10 cm Ao Asc diam:  3.50 cm TRICUSPID VALVE TR Peak grad:    49.6 mmHg TR Vmax:        352.00 cm/s  SHUNTS Systemic VTI:  0.18 m Systemic Diam: 2.10 cm Eleonore Chiquito MD Electronically signed by Eleonore Chiquito MD Signature Date/Time: 10/04/2020/5:34:50 PM    Final     Significant Labs and Imaging:  Recent Labs  Lab 10/04/20 0647 10/05/20 0719  WBC 7.7 8.8  HGB 12.9* 12.6*  HCT 38.3* 37.7*  PLT 121* 137*   Recent Labs  Lab 10/04/20 0647 10/04/20 0800 10/04/20 1829 10/05/20 0719  NA 137  --   --  134*  K 4.4  --   --  4.6  CL 107  --   --  107  CO2 23  --   --  21*  GLUCOSE 114*  --   --  86  BUN 28*  --   --  29*  CREATININE 2.02*  --   --  1.68*  CALCIUM 9.7  --   --  9.2  MG  --   --  1.8  --   ALKPHOS  --  57  --  53  AST  --  49*  --  27  ALT  --  27  --  20  ALBUMIN  --  3.3*  --  3.0*      Results/Tests Pending at Time of Discharge: None  Discharge Medications:  Allergies as of 10/05/2020   No Known Allergies     Medication List    TAKE these medications   albuterol 108 (90 Base) MCG/ACT inhaler Commonly known as: ProAir HFA Inhale 2 puffs into the lungs every 4 (four) hours as needed for wheezing.   allopurinol 100 MG tablet Commonly known as: ZYLOPRIM TAKE 1/2 TABLET BY MOUTH DAILY   bisoprolol 5 MG tablet Commonly known as: ZEBETA Take 1 tablet (5 mg total) by mouth daily. What changed:   medication strength  how much to take   dapagliflozin propanediol 10 MG Tabs tablet Commonly known as: FARXIGA Take 1 tablet (10 mg total) by mouth daily.   Ensure Complete Shake Liqd Take 1 Container by mouth daily. What changed: when to take  this   ferrous sulfate 325 (65 FE) MG tablet TAKE 1 TABLET BY MOUTH EVERY OTHER DAY What changed: when to take this   lactulose 10 GM/15ML solution Commonly known as: CHRONULAC Take 15 mLs (10 g total) by mouth 2 (two) times daily as needed for mild constipation. (NEED TO ENSURE 2 SOFT BM IN A DAY.).   montelukast 10 MG tablet Commonly known as: SINGULAIR TAKE 1 TABLET  BY MOUTH EVERYDAY AT BEDTIME What changed: See the new instructions.   ondansetron 4 MG disintegrating tablet Commonly known as: Zofran ODT Take 1 tablet (4 mg total) by mouth every 8 (eight) hours as needed for nausea.   spironolactone 50 MG tablet Commonly known as: ALDACTONE Take 1 tablet (50 mg total) by mouth daily.   Vitamin D-3 125 MCG (5000 UT) Tabs Take 5,000 Units daily by mouth.   Wixela Inhub 500-50 MCG/ACT Aepb Generic drug: fluticasone-salmeterol TAKE 1 PUFF BY MOUTH TWICE A DAY What changed: See the new instructions.       Discharge Instructions: Please refer to Patient Instructions section of EMR for full details.  Patient was counseled important signs and symptoms that should prompt return to medical care, changes in medications, dietary instructions, activity restrictions, and follow up appointments.   Follow-Up Appointments:  Follow-up Information    Lilland, Alana, DO. Schedule an appointment as soon as possible for a visit in 1 week(s).   Specialty: Family Medicine Contact information: [place] [address] [phone]        Croitoru, Dani Gobble, MD. Schedule an appointment as soon as possible for a visit in 1 week(s).   Specialty: Cardiology Contact information: [address] [place] [address] [phone]        Arta Silence, MD Follow up in 1 week(s).   Specialty: Gastroenterology Contact information: [address] [place] [address] [phone]               Honor Junes, MD 10/05/2020, 12:25 PM PGY-1, [place]

## 2020-10-05 NOTE — Plan of Care (Signed)

## 2020-10-08 ENCOUNTER — Encounter: Payer: Self-pay | Admitting: Family Medicine

## 2020-10-08 ENCOUNTER — Other Ambulatory Visit: Payer: Self-pay

## 2020-10-08 ENCOUNTER — Ambulatory Visit (INDEPENDENT_AMBULATORY_CARE_PROVIDER_SITE_OTHER): Payer: Medicare Other | Admitting: Family Medicine

## 2020-10-08 ENCOUNTER — Other Ambulatory Visit: Payer: Self-pay | Admitting: Family Medicine

## 2020-10-08 VITALS — BP 158/68 | HR 55 | Ht 70.0 in | Wt 174.2 lb

## 2020-10-08 DIAGNOSIS — K7030 Alcoholic cirrhosis of liver without ascites: Secondary | ICD-10-CM

## 2020-10-08 DIAGNOSIS — R001 Bradycardia, unspecified: Secondary | ICD-10-CM | POA: Diagnosis not present

## 2020-10-08 DIAGNOSIS — I483 Typical atrial flutter: Secondary | ICD-10-CM

## 2020-10-08 DIAGNOSIS — I10 Essential (primary) hypertension: Secondary | ICD-10-CM | POA: Diagnosis not present

## 2020-10-08 DIAGNOSIS — D539 Nutritional anemia, unspecified: Secondary | ICD-10-CM | POA: Diagnosis not present

## 2020-10-08 DIAGNOSIS — R1013 Epigastric pain: Secondary | ICD-10-CM

## 2020-10-08 MED ORDER — LACTULOSE 10 GM/15ML PO SOLN: 10.0000 g | Freq: Two times a day (BID) | ORAL | 1 refills | Status: DC | PRN

## 2020-10-08 NOTE — Assessment & Plan Note (Signed)
-   repeat blood pressure improved, continue bisoprolol 5mg  daily

## 2020-10-08 NOTE — Assessment & Plan Note (Signed)
-   with bradycardia in 50s today, asymptomatic but upon med review patient was taking 15mg  daily of bisoprolol (both his old and new script)- we crossed out the old script and he will resume just 5 mg daily - scheduled follow-up in 1 week to recheck heart rate and blood pressure, given return precautions in AVS

## 2020-10-08 NOTE — Assessment & Plan Note (Signed)
-   upon med review noted he has not been taking iron, his colonoscopy is UTD, will recheck iron/tibc/ferritin and folate and B12 level to assess if further nutritional supplementation needed, last Hgb 12.6 on 5/23 in hospital, no signs of GI bleeding, discussed precautions

## 2020-10-08 NOTE — Assessment & Plan Note (Signed)
-   restarted on home lactulose, no signs of confusion/encephalopathy today but patient has only one soft stool per day and had not been taking - no further abd pain since hospital stay

## 2020-10-08 NOTE — Patient Instructions (Addendum)
It was wonderful to see you today.  Please bring ALL of your medications with you to every visit.   Today we talked about:  - STOP taking the bisoprolol 10mg  daily (the one we wrote an X on it), and continue taking the bisoprolol 5 mg - if you have dizziness, chest pain, feel your heart race, or feel like you're going to pass out - call 911 or go to ED - If you have worsening abdominal pain, confusion, fevers, or chills, or inability to pass gas- these would be reasons to go to the ED - I have refilled your lactulose, please start taking twice daily and titrate to 2-3 soft stools daily - We have scheduled a follow-up appointment for you in one week to reassess your heart rate - We are ordering lab work today to reassess your anemia and if you need iron or other vitamins   Thank you for choosing [facility].   Please call [phone] with any questions about today's appointment.  Please be sure to schedule follow up at the front  desk before you leave today.   Yehuda Savannah, MD  Family Medicine

## 2020-10-08 NOTE — Progress Notes (Addendum)
SUBJECTIVE:   CHIEF COMPLAINT / HPI:   Hospital follow-up : Recently admitted 10/04/2020- 10/05/2020 for bradycardia with HR in 40s while on bisprolol 10mg  daily for his history of Aflutter. Also was noting generalized abdominal pain. HR improved to 70s-80s at discharge and he was discharged on bisoprolol 5mg  daily. He is not on anticoagulation per cardiology recommendation due to advanced chronic liver disease. Next appt with cardiology  11/18/2020. Since discharge, he denies any chest pain, dizziness, syncope or pre-syncope, palpitations, or falls.   For his abdominal pain, today he notes pain is improved and has not had since he left the hospital. Eating and drinking well without abdominal pain. No urinary symptoms. Nocturiax4 . No dysuria, no blood in urine, no trouble urinating. No fevers, chills, vomiting, diarrhea. Last BM yesterday, not hard, no blood or black stools. In hospital, US showed cholelithiasis without signs of cholecystitis, no leukocytosis on labs, CT scan of abd/pelvis without acute abnormality to explain this. He does not have any RUQ pain or pain with eating. Pain in hospital improved with IVF, antiemetics and pain medications. He was discharged with Zofran PRN for nausea, does not take anything for pain. He has not been taking his lactulose, has about one soft stool daily, denies confusion.   Macrocytic anemia- he notes he is not taking iron anymore, wasn't aware he needed to take it. Last colonoscopy 11/14/2014 without any polyps, and a few angiodysplastic looking areas without active bleeding. He denies blood in stools or black stools. Has not had lab work in past few years since 2018 to assess for vitamin levels/iron levels. Is willing to check today.  PERTINENT  PMH / PSH: alcoholic cirrhosis of the liver s/p TIPS procedure, HF, atrial flutter, HTN, asthma, CKD stage 3, avascular necrosis of the hip  OBJECTIVE:   BP (!) 158/68   Pulse (!) 55   Ht 5\' 10"  (1.778 m)   Wt  174 lb 3.2 oz (79 kg)   SpO2 99%   BMI 25.00 kg/m   General: A&O, NAD HEENT: No sign of trauma, EOM grossly intact Cardiac: regular rate, bradycardic 58 on my count,  no m/r/g Respiratory: CTAB, normal WOB, no w/c/r GI: Soft, NTTP, non-distended, no guarding or rebound, no fluid wave appreciated, negative Murphy's sign Extremities: NTTP, no peripheral edema. Neuro: Normal gait, moves all four extremities appropriately, walks with cane Psych: Appropriate mood and affect  Repeat blood pressure 142/84 ASSESSMENT/PLAN:   HYPERTENSION, BENIGN SYSTEMIC - repeat blood pressure improved, continue bisoprolol 5mg  daily  Typical atrial flutter (HCC) - with bradycardia in 50s today, asymptomatic but upon med review patient was taking 15mg  daily of bisoprolol (both his old and new script)- we crossed out the old script and he will resume just 5 mg daily - scheduled follow-up in 1 week to recheck heart rate and blood pressure, given return precautions in AVS  Macrocytic anemia - upon med review noted he has not been taking iron, his colonoscopy is UTD, will recheck iron/tibc/ferritin and folate and B12 level to assess if further nutritional supplementation needed, last Hgb 12.6 on 5/23 in hospital, no signs of GI bleeding, discussed precautions  Bradycardia - as above, patient was taking total of 15mg  bisprolol, he is now aware to just take 5mg  daily starting tomorrow  Alcoholic cirrhosis of liver without ascites ([place]) - restarted on home lactulose, no signs of confusion/encephalopathy today but patient has only one soft stool per day and had not been taking - no further abd  pain since hospital stay    Lenoria Chime, MD [place]

## 2020-10-08 NOTE — Assessment & Plan Note (Signed)
-   as above, patient was taking total of 15mg  bisprolol, he is now aware to just take 5mg  daily starting tomorrow

## 2020-10-09 LAB — IRON,TIBC AND FERRITIN PANEL
Ferritin: 779 ng/mL — ABNORMAL HIGH (ref 30–400)
Iron Saturation: 59 % — ABNORMAL HIGH (ref 15–55)
Iron: 126 ug/dL (ref 38–169)
Total Iron Binding Capacity: 212 ug/dL — ABNORMAL LOW (ref 250–450)
UIBC: 86 ug/dL — ABNORMAL LOW (ref 111–343)

## 2020-10-09 LAB — B12 AND FOLATE PANEL
Folate: 16.2 ng/mL (ref >3.0)
Vitamin B-12: 417 pg/mL (ref 232–1245)

## 2020-10-12 ENCOUNTER — Other Ambulatory Visit: Payer: Self-pay | Admitting: Family Medicine

## 2020-10-12 DIAGNOSIS — M109 Gout, unspecified: Secondary | ICD-10-CM

## 2020-10-15 ENCOUNTER — Ambulatory Visit (INDEPENDENT_AMBULATORY_CARE_PROVIDER_SITE_OTHER): Payer: Medicare Other | Admitting: Family Medicine

## 2020-10-15 ENCOUNTER — Other Ambulatory Visit: Payer: Self-pay

## 2020-10-15 ENCOUNTER — Encounter: Payer: Self-pay | Admitting: Family Medicine

## 2020-10-15 VITALS — BP 165/85 | HR 60 | Ht 70.0 in | Wt 177.6 lb

## 2020-10-15 DIAGNOSIS — R001 Bradycardia, unspecified: Secondary | ICD-10-CM

## 2020-10-15 DIAGNOSIS — I10 Essential (primary) hypertension: Secondary | ICD-10-CM

## 2020-10-15 DIAGNOSIS — I5032 Chronic diastolic (congestive) heart failure: Secondary | ICD-10-CM | POA: Diagnosis not present

## 2020-10-15 DIAGNOSIS — N1831 Chronic kidney disease, stage 3a: Secondary | ICD-10-CM

## 2020-10-15 DIAGNOSIS — M87052 Idiopathic aseptic necrosis of left femur: Secondary | ICD-10-CM | POA: Diagnosis not present

## 2020-10-15 DIAGNOSIS — M1990 Unspecified osteoarthritis, unspecified site: Secondary | ICD-10-CM

## 2020-10-15 DIAGNOSIS — K7030 Alcoholic cirrhosis of liver without ascites: Secondary | ICD-10-CM

## 2020-10-15 MED ORDER — LIDOCAINE 4 % EX PTCH: 1.0000 [IU] | MEDICATED_PATCH | Freq: Every day | CUTANEOUS | 3 refills | Status: DC | PRN

## 2020-10-15 MED ORDER — TELMISARTAN 20 MG PO TABS: 20.0000 mg | ORAL_TABLET | Freq: Every day | ORAL | 0 refills | Status: DC

## 2020-10-15 NOTE — Progress Notes (Signed)
    SUBJECTIVE:   CHIEF COMPLAINT / HPI:   Bradycardia follow up - here today to follow up on medication adjustments made at last appointment - hospitalized 5/22-23 for abdominal pain and bradycardia to 40s; upon discharge, reduced bisoprolol from 10 mg to 5 mg daily; HR at that time 70s-80s - at hospital follow up 5/26 was found to be bradycardic to 50s; upon med review he was found to be taking 15 mg bisoprolol daily (both his old and new scripts); instructed to take 5 mg bisoprolol at that time - at appt today HR 60, hypertensive at 169/75 and 158/68 - confirmed today that Mr. Ng is taking only 5 mg bisoprolol - asymptomatic, states he feels wonderful  Hip pain - reports ongoing hip pain - requests more pain medication but cannot remember name, only that he took it 3 times daily and it helped - chart review demonstrates he received tramadol 15 day course back in March - saw orthopedist Dr. Erlinda Hong on 07/14/20; recommended either hip replacement or joint injections; patient declines both - per patient today, he "is too old to be cut into" and he would prefer to simply take pain pills instead - declines referral to sports med today for possible joint injections   PERTINENT  PMH / PSH: HTN, HFrEF, A. Flutter, asthma, alcoholic cirrhosis of liver s/p TIPS, CKD3, secondary hyperPTH of renal origin, avascular necrosis of left hip  OBJECTIVE:   BP (!) 165/85   Pulse 60   Ht 5\' 10"  (1.778 m)   Wt 177 lb 9.6 oz (80.6 kg)   SpO2 97%   BMI 25.48 kg/m    PHQ-9:  Depression screen [facility] 2/9 10/15/2020 10/08/2020 07/02/2020  Decreased Interest 0 (No Data) (No Data)  Down, Depressed, Hopeless 0 - -  PHQ - 2 Score 0 - -  Altered sleeping 0 - -  Tired, decreased energy 0 - -  Change in appetite 0 - -  Feeling bad or failure about yourself  0 - -  Trouble concentrating 0 - -  Moving slowly or fidgety/restless 0 - -  Suicidal thoughts 0 - -  PHQ-9 Score 0 - -  Difficult doing work/chores Not  difficult at all - -  Some recent data might be hidden     GAD-7: No flowsheet data found.   Physical Exam General: Awake, alert, oriented Cardiovascular: Regular rate and rhythm, S1 and S2 present, no murmurs auscultated Respiratory: Lung fields clear to auscultation bilaterally  ASSESSMENT/PLAN:   Bradycardia HR 60 today on 5 mg bisoprolol as instructed. Patient asymptomatic and without complaints. Will continue 5 mg.   HYPERTENSION, BENIGN SYSTEMIC Hypertensive today at 169/75 and 158/68. Given complex medical history, spoke with pharmacy team who recommended low dose ACE/ARB.  - start telmisartan 20 mg - f/u one week for symptom, BP recheck - ambulatory BP monitoring 6/23, 24 with Dr. Valentina Lucks - follow up appt with PCP 6/30 - cardiology appt 7/6  Avascular necrosis of bone of left hip [facility]) Patient requested refill of tramadol. Given complex medical history including decompensated liver cirrhosis and CKD, declined to refill at this time. Offered lidocaine patches. Patient saw ortho who recommended hip replacement or joint injections, both of which patient declines citing "I'm too old to be cut into". Patient would prefer pain medication. Will send referral to pain management clinic due to complexity.      Ezequiel Essex, MD [place]

## 2020-10-16 LAB — BASIC METABOLIC PANEL
BUN/Creatinine Ratio: 12 (ref 10–24)
BUN: 22 mg/dL (ref 8–27)
CO2: 17 mmol/L — ABNORMAL LOW (ref 20–29)
Calcium: 10.1 mg/dL (ref 8.6–10.2)
Chloride: 105 mmol/L (ref 96–106)
Creatinine, Ser: 1.91 mg/dL — ABNORMAL HIGH (ref 0.76–1.27)
Glucose: 86 mg/dL (ref 65–99)
Potassium: 4.6 mmol/L (ref 3.5–5.2)
Sodium: 139 mmol/L (ref 134–144)
eGFR: 35 mL/min/{1.73_m2} — ABNORMAL LOW (ref >59)

## 2020-10-18 NOTE — Assessment & Plan Note (Signed)
HR 60 today on 5 mg bisoprolol as instructed. Patient asymptomatic and without complaints. Will continue 5 mg.

## 2020-10-18 NOTE — Assessment & Plan Note (Signed)
Hypertensive today at 169/75 and 158/68. Given complex medical history, spoke with pharmacy team who recommended low dose ACE/ARB.  - start telmisartan 20 mg - f/u one week for symptom, BP recheck - ambulatory BP monitoring 6/23, 24 with Dr. Valentina Lucks - follow up appt with PCP 6/30 - cardiology appt 7/6

## 2020-10-18 NOTE — Assessment & Plan Note (Signed)
Patient requested refill of tramadol. Given complex medical history including decompensated liver cirrhosis and CKD, declined to refill at this time. Offered lidocaine patches. Patient saw ortho who recommended hip replacement or joint injections, both of which patient declines citing "I'm too old to be cut into". Patient would prefer pain medication. Will send referral to pain management clinic due to complexity.

## 2020-10-20 ENCOUNTER — Telehealth: Payer: Self-pay | Admitting: Family Medicine

## 2020-10-20 NOTE — Telephone Encounter (Signed)
Called patient to discuss lab results from last visit. No answer, could not confirm is was VM os Mr. Shleton, so left HIPPA-safe VM.   Creatinine appears to be within his baseline of 1.6-2.0. Started telmesartan at last visit. Has follow up with me on 6/10, where I will likely check another creatinine to monitor tolerance of new med.   Ezequiel Essex, MD

## 2020-10-22 ENCOUNTER — Other Ambulatory Visit: Payer: Self-pay | Admitting: Gastroenterology

## 2020-10-22 DIAGNOSIS — K7469 Other cirrhosis of liver: Secondary | ICD-10-CM

## 2020-10-23 ENCOUNTER — Other Ambulatory Visit: Payer: Self-pay

## 2020-10-23 ENCOUNTER — Encounter: Payer: Self-pay | Admitting: Family Medicine

## 2020-10-23 ENCOUNTER — Ambulatory Visit (INDEPENDENT_AMBULATORY_CARE_PROVIDER_SITE_OTHER): Payer: Medicare Other | Admitting: Family Medicine

## 2020-10-23 VITALS — BP 122/82 | HR 54 | Ht 70.0 in | Wt 176.0 lb

## 2020-10-23 DIAGNOSIS — Z79899 Other long term (current) drug therapy: Secondary | ICD-10-CM

## 2020-10-23 DIAGNOSIS — I10 Essential (primary) hypertension: Secondary | ICD-10-CM | POA: Diagnosis not present

## 2020-10-23 NOTE — Progress Notes (Signed)
    SUBJECTIVE:   CHIEF COMPLAINT / HPI:   Blood pressure and heart rate recheck - Telmisartan started at last visit for hypertension in context of complex medical history and bradycardia - Patient doing well on telmisartan - No feelings of dizziness, episodes of presyncope or syncope - Able to do normal routine at home without issue  PERTINENT  PMH / PSH: Hypertension, CHF, CKD stage III, decompensated alcoholic cirrhosis of the liver s/p TIPS, atrial flutter, asthma, secondary hyperparathyroidism of renal origin, avascular necrosis of hip  OBJECTIVE:   BP 122/82   Pulse (!) 54   Ht 5\' 10"  (1.778 m)   Wt 176 lb (79.8 kg)   SpO2 98%   BMI 25.25 kg/m    PHQ-9:  Depression screen [facility] 2/9 10/23/2020 10/15/2020 10/08/2020  Decreased Interest 0 0 (No Data)  Down, Depressed, Hopeless 0 0 -  PHQ - 2 Score 0 0 -  Altered sleeping 0 0 -  Tired, decreased energy 0 0 -  Change in appetite 0 0 -  Feeling bad or failure about yourself  0 0 -  Trouble concentrating 0 0 -  Moving slowly or fidgety/restless 0 0 -  Suicidal thoughts 0 0 -  PHQ-9 Score 0 0 -  Difficult doing work/chores - Not difficult at all -  Some recent data might be hidden     GAD-7: No flowsheet data found.   Physical Exam General: Awake, alert, oriented Cardiovascular: Regular rate and rhythm, S1 and S2 present, no murmurs auscultated Respiratory: Lung fields clear to auscultation bilaterally Extremities: No bilateral lower extremity edema, palpable pedal and pretibial pulses bilaterally  ASSESSMENT/PLAN:   HYPERTENSION, BENIGN SYSTEMIC Blood pressure today much improved, 122/82.  Pulse 54.  Tolerating telmisartan well, no adverse side effects.  Will continue on telmisartan 20 mg tablet nightly.  Has appointment 6/23 with Dr. Valentina Lucks for ambulatory BP monitoring and follow-up with PCP on 6/30.  Will see cardiologist 7/06.  Return precautions given.  Obtain blood sample for BMP today, check creatinine and  potassium.     Ezequiel Essex, MD [place]

## 2020-10-23 NOTE — Patient Instructions (Signed)
It was wonderful to meet you today. Thank you for allowing me to be a part of your care. Below is a short summary of what we discussed at your visit today:  Blood pressure Your blood pressure today is within goal! Keep taking your medicines as prescribed.   Please come to the clinic for your appointments with Dr. Valentina Lucks. This will allow [place] to check your blood pressure for a full 24 hours and make adjustments as needed.   Labs Today we obtained a blood sample to check on your body after starting the new medicine, telmisartan. If the results are normal, I will send you a letter or MyChart message. If the results are abnormal, I will give you a call.     Please bring all of your medications to every appointment!  If you have any questions or concerns, please do not hesitate to contact us via phone or MyChart message.   Ezequiel Essex, MD

## 2020-10-23 NOTE — Assessment & Plan Note (Signed)
Blood pressure today much improved, 122/82.  Pulse 54.  Tolerating telmisartan well, no adverse side effects.  Will continue on telmisartan 20 mg tablet nightly.  Has appointment 6/23 with Dr. Valentina Lucks for ambulatory BP monitoring and follow-up with PCP on 6/30.  Will see cardiologist 7/06.  Return precautions given.  Obtain blood sample for BMP today, check creatinine and potassium.

## 2020-10-24 LAB — BASIC METABOLIC PANEL
BUN/Creatinine Ratio: 16 (ref 10–24)
BUN: 24 mg/dL (ref 8–27)
CO2: 19 mmol/L — ABNORMAL LOW (ref 20–29)
Calcium: 9.8 mg/dL (ref 8.6–10.2)
Chloride: 106 mmol/L (ref 96–106)
Creatinine, Ser: 1.53 mg/dL — ABNORMAL HIGH (ref 0.76–1.27)
Glucose: 69 mg/dL (ref 65–99)
Potassium: 4.6 mmol/L (ref 3.5–5.2)
Sodium: 143 mmol/L (ref 134–144)
eGFR: 46 mL/min/{1.73_m2} — ABNORMAL LOW (ref >59)

## 2020-10-30 ENCOUNTER — Other Ambulatory Visit: Payer: Self-pay | Admitting: Cardiovascular Disease

## 2020-11-03 ENCOUNTER — Other Ambulatory Visit: Payer: Self-pay | Admitting: Family Medicine

## 2020-11-05 ENCOUNTER — Other Ambulatory Visit: Payer: Self-pay

## 2020-11-05 ENCOUNTER — Ambulatory Visit (INDEPENDENT_AMBULATORY_CARE_PROVIDER_SITE_OTHER): Payer: Medicare Other | Admitting: Pharmacist

## 2020-11-05 ENCOUNTER — Encounter: Payer: Self-pay | Admitting: Pharmacist

## 2020-11-05 DIAGNOSIS — I10 Essential (primary) hypertension: Secondary | ICD-10-CM | POA: Diagnosis not present

## 2020-11-05 NOTE — Patient Instructions (Signed)
Blood Pressure Activity Diary Time Lying down/ Sleeping Walking/ Exercise Stressed/ Angry Headache/ Pain Dizzy  9 AM       10 AM       11 AM       12 PM       1 PM       2 PM       Time Lying down/ Sleeping Walking/ Exercise Stressed/ Angry Headache/ Pain Dizzy  3 PM       4 PM        5 PM       6 PM       7 PM       8 PM       Time Lying down/ Sleeping Walking/ Exercise Stressed/ Angry Headache/ Pain Dizzy  9 PM       10 PM       11 PM       12 AM       1 AM       2 AM       3 AM       Time Lying down/ Sleeping Walking/ Exercise Stressed/ Angry Headache/ Pain Dizzy  4 AM       5 AM       6 AM       7 AM       8 AM       9 AM       10 AM        Time you woke up: _________                  Time you went to sleep:__________  Come back tomorrow at 8:30 AM to have the monitor removed Call the Family [facility] Clinic if you have any questions before then ([phone])  Wearing the Blood Pressure Monitor The cuff will inflate every 20 minutes during the day and every 30 minutes while you sleep. Your blood pressure readings will NOT display after cuff inflation Fill out the blood pressure-activity diary during the day, especially during activities that may affect your reading -- such as exercise, stress, walking, taking your blood pressure medications  Important things to know: Avoid taking the monitor off for the next 24 hours, unless it causes you discomfort or pain. Do NOT get the monitor wet and do NOT dry to clean the monitor with any cleaning products. Do NOT put the monitor on anyone else's arm. When the cuff inflates, avoid excess movement. Let the cuffed arm hang loosely, slightly away from the body. Avoid flexing the muscles or moving the hand/fingers. When you go to sleep, make sure that the hose is not kinked. Remember to fill out the blood pressure activity diary. If you experience severe pain or unusual pain (not associated with getting your blood pressure  checked), remove the monitor.  Troubleshooting:  Code  Troubleshooting   1  Check cuff position, tighten cuff   2, 3  Remain still during reading   4, 87  Check air hose connections and make sure cuff is tight   85, 89  Check hose connections and make tubing is not crimped   86  Push START/STOP to restart reading   88, 91  Retry by pushing START/STOP   90  Replace batteries. If problem persists, remove monitor and bring back to   clinic at follow up   97, 98, 99  Service required - Remove monitor and bring back to clinic   at follow up    

## 2020-11-05 NOTE — Progress Notes (Signed)
   S:    Patient arrives in good spirits ambulating with a cane. Presents to the clinic for ambulatory blood pressure evaluation.   Patient was referred and last seen by Primary Care Provider on 10/23/20.   Medication compliance is reported to be fair - has not started telmisartan (never picked up). Reports managing his own medications with a pill box Reports not missing any doses of his medications Reports taking 5 and 1/2 pills daily  Discussed procedure for wearing the monitor and gave patient written instructions. Monitor was placed on non-dominant arm with instructions to return in the morning.   Current BP Medications include: telmisartan 20 mg daily, bisoprolol 5 mg daily, spironolactone 50 mg daily  Dietary habits include: tries to minimizes salt   Exercise: walks 3 times daily   Home BP readings: 130-140s/55  O:  Physical Exam Constitutional:      Appearance: Normal appearance. He is normal weight.  Pulmonary:     Effort: Pulmonary effort is normal.  Psychiatric:        Mood and Affect: Mood normal.        Thought Content: Thought content normal.    Review of Systems  All other systems reviewed and are negative.  Last 3 Office BP readings: BP Readings from Last 3 Encounters:  11/05/20 (!) 143/60  10/23/20 122/82  10/15/20 (!) 165/85    Clinical Atherosclerotic Cardiovascular Disease (ASCVD): No  The 10-year ASCVD risk score Mikey Bussing DC Jr., et al., 2013) is: 25.2%   Values used to calculate the score:     Age: 80 years     Sex: Male     Is Non-Hispanic African American: Yes     Diabetic: No     Tobacco smoker: No     Systolic Blood Pressure: 638 mmHg     Is BP treated: Yes     HDL Cholesterol: 60 mg/dL     Total Cholesterol: 143 mg/dL  Basic Metabolic Panel    Component Value Date/Time   NA 143 10/23/2020 1352   K 4.6 10/23/2020 1352   CL 106 10/23/2020 1352   CO2 19 (L) 10/23/2020 1352   GLUCOSE 69 10/23/2020 1352   GLUCOSE 86 10/05/2020 0719   BUN  24 10/23/2020 1352   CREATININE 1.53 (H) 10/23/2020 1352   CREATININE 1.19 (H) 09/29/2016 1701   CALCIUM 9.8 10/23/2020 1352   GFRNONAA 41 (L) 10/05/2020 0719   GFRNONAA 60 09/29/2016 1701   GFRAA 44 (L) 01/23/2020 1614   GFRAA 69 09/29/2016 1701    Renal function: Estimated Creatinine Clearance: 41.1 mL/min (A) (by C-G formula based on SCr of 1.53 mg/dL (H)).  Today's Office Blood Pressure (BP) reading: 157/73 mmHg (manual reading)   A/P: History of hypertension set-up with 24-hour ambulatory blood pressure.  Currently taking multiple drug regimen. Educated on details for use of amb BP monitor.  Patient verbalized understanding of plan and will return 6/24 in AM.     Results reviewed and written information provided.  Total time in face-to-face counseling 15 minutes.   F/U Clinic Visit tomorrow for BP monitor interpretation.  Patient seen with Caren Macadam PharmD Candidate, Romilda Garret, PharmD - PGY-1 Resident. and Lorel Monaco, PharmD, BCPS - PGY2 Pharmacy Resident.

## 2020-11-06 ENCOUNTER — Ambulatory Visit (INDEPENDENT_AMBULATORY_CARE_PROVIDER_SITE_OTHER): Payer: Medicare Other | Admitting: Pharmacist

## 2020-11-06 DIAGNOSIS — I10 Essential (primary) hypertension: Secondary | ICD-10-CM | POA: Diagnosis not present

## 2020-11-06 DIAGNOSIS — M109 Gout, unspecified: Secondary | ICD-10-CM

## 2020-11-06 MED ORDER — LOSARTAN POTASSIUM 25 MG PO TABS: 25.0000 mg | ORAL_TABLET | Freq: Every day | ORAL | 3 refills | Status: DC

## 2020-11-06 MED ORDER — SPIRONOLACTONE 25 MG PO TABS: 25.0000 mg | ORAL_TABLET | Freq: Every day | ORAL | 3 refills | Status: DC

## 2020-11-06 NOTE — Progress Notes (Signed)
Reviewed: Agree with Dr. Koval's documentation and management. 

## 2020-11-06 NOTE — Patient Instructions (Addendum)
It was great meeting you today!  Your blood pressure is close to your goal of <130/80 mmHg. We are making a few changes today to make sure your blood pressure doesn't drop too low.  - STOP telmisartan 20 mg daily  - START losartan 25 mg daily - REDUCE spironolactone to 25 mg daily   Follow-up with Dr. Oleh Genin next week.

## 2020-11-06 NOTE — Progress Notes (Signed)
S:    Patient returns to office with amb BP monitor in place.  He arrives ambulating with a cane.     Patient was referred by Dr. Thompson Caul 10/23/2020.  He has been assigned to Dr. Oleh Genin and has an appointment with her on 6/30.   Diagnosed with Hypertension in 2008.    Medication compliance is reported to be good, patient only knows medication doses and not names.  Discussed procedure for wearing the monitor and gave patient written instructions. Monitor was placed on non-dominant arm with instructions to return in the morning.   Current BP Medications include:  bisoprolol, spironolactone and possibly telmisartan (which may or may not be taking).  Patient denies taking but it was dispensed at beginning of June.  He denies using furosemide at this time.    O:  Physical Exam Constitutional:      Appearance: Normal appearance. He is normal weight.  Neurological:     Mental Status: He is alert.  Psychiatric:        Mood and Affect: Mood normal.        Behavior: Behavior normal.        Thought Content: Thought content normal.    Review of Systems  Neurological:  Negative for dizziness.   Last 3 Office BP readings: BP Readings from Last 3 Encounters:  11/05/20 (!) 143/60  10/23/20 122/82  10/15/20 (!) 016/01    Basic Metabolic Panel    Component Value Date/Time   NA 143 10/23/2020 1352   K 4.6 10/23/2020 1352   CL 106 10/23/2020 1352   CO2 19 (L) 10/23/2020 1352   GLUCOSE 69 10/23/2020 1352   GLUCOSE 86 10/05/2020 0719   BUN 24 10/23/2020 1352   CREATININE 1.53 (H) 10/23/2020 1352   CREATININE 1.19 (H) 09/29/2016 1701   CALCIUM 9.8 10/23/2020 1352   GFRNONAA 41 (L) 10/05/2020 0719   GFRNONAA 60 09/29/2016 1701   GFRAA 44 (L) 01/23/2020 1614   GFRAA 69 09/29/2016 1701    Renal function: Estimated Creatinine Clearance: 41.1 mL/min (A) (by C-G formula based on SCr of 1.53 mg/dL (H)).   ABPM Study Data: Arm Placement left arm  Overall Mean 24hr BP:   134/68  mmHg HR: 82  Daytime Mean BP:  135/69 mmHg HR: 85  Nighttime Mean BP:  128/63 mmHg HR: 69  Dipping Pattern: Yes.    Sys:   5.4%   Dia: 8.9%   [normal dipping ~10-20%]  Non-hypertensive ABPM thresholds: daytime BP <125/75 mmHg, sleeptime BP <120/70 mmHg     A/P: History of hypertension since 2008 found to have isolated systolic hypertension given 24-hour ambulatory blood pressure demonstrates mildly elevated systolic readings > 093 and an average diastolic blood pressure of <70 mmHg.  He has a nocturnal dipping pattern that is  less than ideal at < 10% .  Changes to medications: - Decrease spironolactone from 50mg  to 25mg  once daily.  New prescription provided.  - Start (replace telmisartan previous order) Losartan 25mg  once daily.  Follow-up with Dr. Oleh Genin at next office visit - plan to obtain BMET AND uric acid.  If uric acid is < 6 consider trial off of low dose allopurinol as patient has been without gout flare for years and losartan may be adequate to maintain uric acid at goal.     Phone call to pharmacy clarifying D/C of spironolactone 50mg  (replaced with lower 25mg  dose) and New losartan order will replace the prvious telmisartan order.    Results  reviewed and written information provided.  Total time in face-to-face counseling 20 minutes.   F/U Clinic Visit with Dr. Oleh Genin.  Patient seen with Caren Macadam PharmD Candidate, and Romilda Garret, PharmD - PGY-1 Resident.

## 2020-11-06 NOTE — Assessment & Plan Note (Signed)
-   Start (replace telmisartan previous order) Losartan 25mg  once daily.  Follow-up with Dr. Oleh Genin at next office visit - plan to obtain BMET AND uric acid.  If uric acid is < 6 consider trial off of low dose allopurinol as patient has been without gout flare for years and losartan may be adequate to maintain uric acid at goal.

## 2020-11-06 NOTE — Assessment & Plan Note (Signed)
History of hypertension since 2008 found to have isolated systolic hypertension given 24-hour ambulatory blood pressure demonstrates mildly elevated systolic readings > 119 and an average diastolic blood pressure of <70 mmHg.  He has a nocturnal dipping pattern that is less than ideal at < 10%.  Changes to medications: - Decrease spironolactone from 50mg  to 25mg  once daily.  New prescription provided.  - Start (replace telmisartan previous order) Losartan 25mg  once daily.  Follow-up with Dr. Oleh Genin at next office visit - plan to obtain BMET AND uric acid.  If uric acid is < 6 consider trial off of low dose allopurinol as patient has been without gout flare for years and losartan may be adequate to maintain uric acid at goal.     . Phone call to pharmacy clarifying D/C of spironolactone 50mg  (replaced with lower 25mg  dose) and New losartan order will replace the prvious telmisartan order.

## 2020-11-06 NOTE — Assessment & Plan Note (Signed)
History of hypertension set-up with 24-hour ambulatory blood pressure.  Currently taking multiple drug regimen. Educated on details for use of amb BP monitor.  Patient verbalized understanding of plan and will return 6/24 in AM.

## 2020-11-09 ENCOUNTER — Ambulatory Visit
Admission: RE | Admit: 2020-11-09 | Discharge: 2020-11-09 | Disposition: A | Discharge status: Home / Self Care | Payer: Medicare Other | Source: Ambulatory Visit | Attending: Gastroenterology | Admitting: Gastroenterology

## 2020-11-09 DIAGNOSIS — K7469 Other cirrhosis of liver: Secondary | ICD-10-CM

## 2020-11-09 DIAGNOSIS — K7460 Unspecified cirrhosis of liver: Secondary | ICD-10-CM | POA: Diagnosis not present

## 2020-11-12 ENCOUNTER — Ambulatory Visit (INDEPENDENT_AMBULATORY_CARE_PROVIDER_SITE_OTHER): Payer: Medicare Other | Admitting: Family Medicine

## 2020-11-12 ENCOUNTER — Other Ambulatory Visit: Payer: Self-pay

## 2020-11-12 ENCOUNTER — Encounter: Payer: Self-pay | Admitting: Family Medicine

## 2020-11-12 VITALS — BP 130/80 | HR 113 | Ht 70.0 in | Wt 167.1 lb

## 2020-11-12 DIAGNOSIS — M87052 Idiopathic aseptic necrosis of left femur: Secondary | ICD-10-CM

## 2020-11-12 DIAGNOSIS — Z7189 Other specified counseling: Secondary | ICD-10-CM | POA: Diagnosis not present

## 2020-11-12 DIAGNOSIS — M109 Gout, unspecified: Secondary | ICD-10-CM | POA: Diagnosis not present

## 2020-11-12 DIAGNOSIS — I10 Essential (primary) hypertension: Secondary | ICD-10-CM | POA: Diagnosis not present

## 2020-11-12 NOTE — Assessment & Plan Note (Signed)
Patient still ambulating with basic chronic pain that he states is manageable.  Patient currently not interested in pain management clinic but is aware of the option.  We will continue to follow and referral is always available for patient.

## 2020-11-12 NOTE — Assessment & Plan Note (Signed)
BP today 130/80.  Current medications include Cozaar 25 mg daily, spironolactone 25 mg daily. - Continue current medications -Check BMP today

## 2020-11-12 NOTE — Progress Notes (Signed)
    SUBJECTIVE:   CHIEF COMPLAINT / HPI:   Multiple chronic conditions Patient with multiple comorbid/chronic conditions that places patient at risk for hospitalization and overall worsening of health.  Patient states that his niece and nephew have power of attorney, he has felt on the advance directive before.  He does not want to have any type of intubation if he is hospitalized, but would be okay with CPR and resuscitation efforts that are noninvasive.  Patient is able to manage most aspect of his life on his own.  Avascular necrosis of the left hip [facility] has been trying to reach out to the patient and left messages for him to call them back.  Patient is not interested at this time as he has not had significant pain like he did before.  He has been living with chronic pain all of his life and is doing okay with it and still able to walk like he wants to live the life that he wants to live.  He states that if that pain became recurrent or he was having issues that we would be the first to know when he would consider going to the pain management clinic if it persisted.  Hypertension Patient had recent blood pressure medication changes and is doing well with them.  Has no concerns at this time   PERTINENT  PMH / PSH: Reviewed  OBJECTIVE:   BP 130/80   Pulse (!) 113   Ht 5\' 10"  (1.778 m)   Wt 167 lb 2 oz (75.8 kg)   SpO2 98%   BMI 23.98 kg/m   General: NAD, well-appearing, well-nourished Respiratory: No respiratory distress, breathing comfortably, able to speak in full sentences Skin: warm and dry, no rashes noted on exposed skin Psych: Appropriate affect and mood   ASSESSMENT/PLAN:   HYPERTENSION, BENIGN SYSTEMIC BP today 130/80.  Current medications include Cozaar 25 mg daily, spironolactone 25 mg daily. - Continue current medications -Check BMP today  Gout Recently seen by Dr. Valentina Lucks in the office.  Will obtain uric acid level today and if <6, will  trial off low-dose allopurinol.  Patient has been started on losartan, which may maintain uric acid at goal level  Advanced directives, counseling/discussion Patient reports he would like to be partial code, he would like to have CPR and basic resuscitative efforts but does not want anything invasive including intubation.  Still does not want to be kept on "life support" and does not want a feeding tube or any other invasive procedures.  States that his powers of attorney are his niece and nephew (which she did not mean today), and that they and all of his physicians have been made aware of his wants.  Avascular necrosis of bone of left hip [facility]) Patient still ambulating with basic chronic pain that he states is manageable.  Patient currently not interested in pain management clinic but is aware of the option.  We will continue to follow and referral is always available for patient.     [name], [place]

## 2020-11-12 NOTE — Assessment & Plan Note (Signed)
Patient reports he would like to be partial code, he would like to have CPR and basic resuscitative efforts but does not want anything invasive including intubation.  Still does not want to be kept on "life support" and does not want a feeding tube or any other invasive procedures.  States that his powers of attorney are his niece and nephew (which she did not mean today), and that they and all of his physicians have been made aware of his wants.

## 2020-11-12 NOTE — Patient Instructions (Signed)
If you continue to have worsening hip pain located the major go to the hospital to make sure to call our office.  We have a pain management clinic we would recommend she go to to further manage that pain if it became a recurrent issue.  Will be getting labs to check your kidney for excision and another level to check for gout.  We will call you with any changes that we need to make to your medications or we will send a letter if there is nothing we change.  It will be important for the people in your life to know what you want your wishes to be for goals of care.  By this I mean what you want your future health and care to look like, how aggressive you would like your treatments to be, and what you foresee for your future.

## 2020-11-12 NOTE — Assessment & Plan Note (Signed)
Recently seen by Dr. Valentina Lucks in the office.  Will obtain uric acid level today and if <6, will trial off low-dose allopurinol.  Patient has been started on losartan, which may maintain uric acid at goal level

## 2020-11-13 ENCOUNTER — Telehealth: Payer: Self-pay | Admitting: Family Medicine

## 2020-11-13 DIAGNOSIS — E875 Hyperkalemia: Secondary | ICD-10-CM

## 2020-11-13 LAB — BASIC METABOLIC PANEL
BUN/Creatinine Ratio: 12 (ref 10–24)
BUN: 24 mg/dL (ref 8–27)
CO2: 14 mmol/L — ABNORMAL LOW (ref 20–29)
Calcium: 10.5 mg/dL — ABNORMAL HIGH (ref 8.6–10.2)
Chloride: 108 mmol/L — ABNORMAL HIGH (ref 96–106)
Creatinine, Ser: 2.06 mg/dL — ABNORMAL HIGH (ref 0.76–1.27)
Glucose: 89 mg/dL (ref 65–99)
Potassium: 5.3 mmol/L — ABNORMAL HIGH (ref 3.5–5.2)
Sodium: 143 mmol/L (ref 134–144)
eGFR: 32 mL/min/{1.73_m2} — ABNORMAL LOW (ref >59)

## 2020-11-13 LAB — URIC ACID: Uric Acid: 6.5 mg/dL (ref 3.8–8.4)

## 2020-11-13 NOTE — Telephone Encounter (Signed)
Attempted to call patient regarding his BMP results, creatinine is elevated from last check, but is not far above his baseline when looking back at recent results. Potassium is elevated and patient is on several medications that can be causing that including spironolactone, losartan, and dapafliflozin.   Will continue to call patient to ask him to hold his spironolactone and losartan for the weekend and to come to the clinic on Tuesday to repeat his lab test.  Will place BMP order at this time to be completed Tuesday.   Ludivina Guymon, DO

## 2020-11-17 ENCOUNTER — Telehealth: Payer: Self-pay

## 2020-11-17 ENCOUNTER — Other Ambulatory Visit: Payer: Medicare Other

## 2020-11-17 ENCOUNTER — Other Ambulatory Visit: Payer: Self-pay

## 2020-11-17 DIAGNOSIS — E875 Hyperkalemia: Secondary | ICD-10-CM | POA: Diagnosis not present

## 2020-11-17 NOTE — Progress Notes (Signed)
Cardiology office note    Date:  11/18/2020   ID:  [name], DOB 06-14-1940, MRN [id number]   PCP:  [name], DO  Cardiologist:  Sanda Klein, MD  Electrophysiologist:  None   Evaluation Performed:  Follow-Up Visit  Chief Complaint:  AFlutter, CHF  History of Present Illness:    Donald Moreno is a 80 y.o. male with recurrent persistent atrial flutter,  congestive heart failure with preserved left ventricular systolic function during periods of RVR, markedly improved after arrhythmia control.  He was initially managed with amiodarone but this had to be stopped due to junctional rhythm.  He has advanced chronic liver disease (cirrhosis, history of multiple paracentesis for massive ascites, improved after TIPS).  Because of his liver problems with spontaneously elevated prothrombin time, he is not anticoagulated.  In April 2021 had problems with RVR during COVID infection. In May 2022 had asymptomatic bradycardia (Atrial flutter with slow ventricular response). Bisoprolol dose was reduced. Echo during that admission showed moderate PAH with normal right ventricular function.  On event monitor in January 2020, he had persistent atrial flutter with rates of 38-129 bpm, average 74 bpm and occasional pauses over 3 seconds.  He is doing quite well.  He denies any problems with shortness of breath at rest or with activity, orthopnea, PND, lower extremity edema or ascites.  He denies dizziness and syncope.  Has not had any falls or serious bleeding problems.  He has never had overt encephalopathy.  Continues to walk on a daily basis.  No longer taking furosemide, fluid excess seems to be well managed with Farxiga and spironolactone.  He lives with his niece who is a Dietitian.    Past Medical History:  Diagnosis Date   Acute congestive heart failure ([place])    Arthritis    "all over"   Asthma    Blood in stool 04/11/2017   Chronic kidney disease    "I see a kidney dr @  [facility]" (10/11/2016)   Chronic lower back pain    "turned a truck over a long time ago" (10/11/2016)   Dysrhythmia    bradycardia with occassional junctional rhythm   ETOH abuse    Gout    Hypertension    Inguinal hernia 10/28/2014   S/P inguinal hernia repair with 4L as ascites removed 03/30/17   Liver cirrhosis ([place]) 2015   Macrocytosis    Pneumonia    "long long time ago" (10/11/2016)   Spontaneous bacterial peritonitis ([place]) 07/01/2015   Past Surgical History:  Procedure Laterality Date   INGUINAL HERNIA REPAIR Bilateral 03/30/2017   Procedure: OPEN BILATERAL INGUINAL HERNIA REPAIR WITH MESH;  Surgeon: Kinsinger, Arta Bruce, MD;  Location: [facility];  Service: General;  Laterality: Bilateral;  GENERAL COMBINED WITH REGIONAL FOR POST OP PAIN    INSERTION OF MESH N/A 10/16/2016   Procedure: INSERTION OF MESH;  Surgeon: Kieth Brightly Arta Bruce, MD;  Location: [facility];  Service: General;  Laterality: N/A;   INSERTION OF MESH Bilateral 03/30/2017   Procedure: INSERTION OF MESH;  Surgeon: Kieth Brightly Arta Bruce, MD;  Location: [facility];  Service: General;  Laterality: Bilateral;  GENERAL COMBINED WITH REGIONAL FOR POST OP PAIN    IR PARACENTESIS  08/19/2016   IR PARACENTESIS  08/26/2016   IR PARACENTESIS  09/02/2016   IR PARACENTESIS  09/09/2016   IR PARACENTESIS  09/16/2016   IR PARACENTESIS  10/03/2016   IR PARACENTESIS  10/11/2016   IR RADIOLOGIST EVAL & MGMT  09/29/2016  IR RADIOLOGIST EVAL & MGMT  12/21/2016   IR RADIOLOGIST EVAL & MGMT  01/23/2018   IR RADIOLOGIST EVAL & MGMT  02/19/2019   IR RADIOLOGIST EVAL & MGMT  03/30/2020   IR TIPS  10/11/2016   RADIOLOGY WITH ANESTHESIA N/A 10/11/2016   Procedure: TIPS;  Surgeon: Arne Cleveland, MD;  Location: [facility];  Service: Radiology;  Laterality: N/A;   UMBILICAL HERNIA REPAIR N/A 10/16/2016   Procedure: HERNIA REPAIR UMBILICAL ADULT;  Surgeon: Kinsinger, Arta Bruce, MD;  Location: [facility];  Service: General;  Laterality: N/A;     Current Meds   Medication Sig   albuterol (PROAIR HFA) 108 (90 Base) MCG/ACT inhaler Inhale 2 puffs into the lungs every 4 (four) hours as needed for wheezing.   allopurinol (ZYLOPRIM) 100 MG tablet TAKE 1/2 TABLET BY MOUTH DAILY   bisoprolol (ZEBETA) 5 MG tablet TAKE 1 TABLET (5 MG TOTAL) BY MOUTH DAILY.   Cholecalciferol (VITAMIN D-3) 5000 units TABS Take 5,000 Units daily by mouth.    dapagliflozin propanediol (FARXIGA) 10 MG TABS tablet Take 1 tablet (10 mg total) by mouth daily.   ferrous sulfate 325 (65 FE) MG tablet TAKE 1 TABLET BY MOUTH EVERY OTHER DAY   lactulose (CHRONULAC) 10 GM/15ML solution Take 15 mLs (10 g total) by mouth 2 (two) times daily as needed for mild constipation. (NEED TO ENSURE 2 SOFT BM IN A DAY.).   Lidocaine (HM LIDOCAINE PATCH) 4 % PTCH Apply 1 Units topically daily as needed.   montelukast (SINGULAIR) 10 MG tablet TAKE 1 TABLET BY MOUTH EVERYDAY AT BEDTIME (Patient taking differently: Take 10 mg by mouth at bedtime.)   Nutritional Supplements (ENSURE COMPLETE SHAKE) LIQD Take 1 Container by mouth daily. (Patient taking differently: Take 1 Container by mouth 2 (two) times daily.)   ondansetron (ZOFRAN ODT) 4 MG disintegrating tablet Take 1 tablet (4 mg total) by mouth every 8 (eight) hours as needed for nausea.   spironolactone (ALDACTONE) 25 MG tablet Take 1 tablet (25 mg total) by mouth at bedtime.   WIXELA INHUB 500-50 MCG/DOSE AEPB TAKE 1 PUFF BY MOUTH TWICE A DAY (Patient taking differently: Inhale 1 puff into the lungs in the morning and at bedtime.)   [DISCONTINUED] losartan (COZAAR) 25 MG tablet Take 1 tablet (25 mg total) by mouth at bedtime.     Allergies:   Patient has no known allergies.   Social History   Tobacco Use   Smoking status: Never   Smokeless tobacco: Current    Types: Snuff, Chew   Tobacco comments:    Started chewing tobacco at 80 years old  Vaping Use   Vaping Use: Never used  Substance Use Topics   Alcohol use: No    Comment: quit drinking  "over 2 yr ago" (10/11/2016)   Drug use: No     Family Hx: The patient's family history includes Asthma in his brother, brother, sister, and son; Heart disease in his sister; Hypertension in his brother and brother; Other in his father and mother.  ROS:   Please see the history of present illness.    All other systems are reviewed and are negative.  Prior CV studies:   The following studies were reviewed today: AFib clinic notes  Labs/Other Tests and Data Reviewed:    ECHO 10/05/2020: 1. Left ventricular ejection fraction, by estimation, is 55 to 60%. The left ventricle has normal function. The left ventricle has no regional wall motion abnormalities. Left ventricular diastolic function could not  be evaluated.   2. Right ventricular systolic function is normal. The right ventricular size is normal. There is moderately elevated pulmonary artery systolic pressure. The estimated right ventricular systolic pressure is 86.7 mmHg.   3. Left atrial size was mildly dilated.   4. The mitral valve is degenerative. Mild mitral valve regurgitation. No evidence of mitral stenosis.   5. The aortic valve is tricuspid. Aortic valve regurgitation is not visualized. No aortic stenosis is present.   6. The inferior vena cava is dilated in size with >50% respiratory variability, suggesting right atrial pressure of 8 mmHg.  EKG: ECG ordered today shows atrial flutter with variable AV block and voltage criteria for LVH, otherwise a normal tracing.  QTc 458 ms  Recent Labs: 10/04/2020: B Natriuretic Peptide 477.8; Magnesium 1.8; TSH 1.846 10/05/2020: ALT 20; Hemoglobin 12.6; Platelets 137 11/17/2020: BUN 30; Creatinine, Ser 1.81; Potassium 4.9; Sodium 138   Recent Lipid Panel Lab Results  Component Value Date/Time   CHOL 143 10/04/2020 06:29 PM   CHOL 110 12/19/2019 10:25 AM   TRIG 23 10/04/2020 06:29 PM   HDL 60 10/04/2020 06:29 PM   HDL 56 12/19/2019 10:25 AM   CHOLHDL 2.4 10/04/2020 06:29 PM    LDLCALC 78 10/04/2020 06:29 PM   LDLCALC 43 12/19/2019 10:25 AM    Wt Readings from Last 3 Encounters:  11/18/20 172 lb (78 kg)  11/12/20 167 lb 2 oz (75.8 kg)  11/05/20 169 lb 6.4 oz (76.8 kg)     Objective:    Vital Signs:  BP 130/62   Pulse 84   Ht 5\' 10"  (1.778 m)   Wt 172 lb (78 kg)   SpO2 97%   BMI 24.68 kg/m     General: Alert, oriented x3, no distress, appears lean and fit Head: no evidence of trauma, PERRL, EOMI, no exophtalmos or lid lag, no myxedema, no xanthelasma; normal ears, nose and oropharynx Neck: normal jugular venous pulsations and no hepatojugular reflux; brisk carotid pulses without delay and no carotid bruits Chest: clear to auscultation, no signs of consolidation by percussion or palpation, normal fremitus, symmetrical and full respiratory excursions Cardiovascular: normal position and quality of the apical impulse, regular rhythm, normal first and second heart sounds, no murmurs, rubs or gallops Abdomen: no tenderness or distention, no masses by palpation, no abnormal pulsatility or arterial bruits, normal bowel sounds, no hepatosplenomegaly Extremities: no clubbing, cyanosis or edema; 2+ radial, ulnar and brachial pulses bilaterally; 2+ right femoral, posterior tibial and dorsalis pedis pulses; 2+ left femoral, posterior tibial and dorsalis pedis pulses; no subclavian or femoral bruits Neurological: grossly nonfocal Psych: Normal mood and affect   ASSESSMENT & PLAN:    1. Typical atrial flutter ([place])   2. Chronic diastolic congestive heart failure ([place])   3. PAH (pulmonary artery hypertension) ([place])   4. Alcoholic cirrhosis of liver without ascites (HCC)   5. Stage 3b chronic kidney disease (HCC)       A Flutter: Asymptomatic, rate controlled.  We have not recommended anticoagulation, due to advanced chronic liver disease.  Prothrombin time is mildly elevated at 15.4 (INR 1.2) spontaneously. CHADSVasc 2-3 (age; CHF was rate related), embolic risk  is relatively low especially with flutter. CHF: Clinically euvolemic.  On spironolactone.  Has not had ascites since the TIPS procedure.  Ever since his flutter rates have been well controlled he has not had any problems with heart failure. PAH:  by most recent echo.  Asymptomatic.  Suspect this is related to portal  hypertension/hepatic shunting. Cirrhosis: No evidence of hemodynamic decompensation and relatively subtle signs of parenchymal insufficiency (albumin 3.0, INR 1.2, bilirubin 2.3).  As excellent quality of life.  Has been abstinent from alcohol. CKD 3: Most recent creatinine on August 5 was 2.06 (baseline is 1.5-1.9).   Patient Instructions  Medication Instructions:  No changes *If you need a refill on your cardiac medications before your next appointment, please call your pharmacy*   Lab Work: None ordered If you have labs (blood work) drawn today and your tests are completely normal, you will receive your results only by: [place] (if you have MyChart) OR A paper copy in the mail If you have any lab test that is abnormal or we need to change your treatment, we will call you to review the results.   Testing/Procedures: None ordered   Follow-Up: At [facility], you and your health needs are our priority.  As part of our continuing mission to provide you with exceptional heart care, we have created designated Provider Care Teams.  These Care Teams include your primary Cardiologist (physician) and Advanced Practice Providers (APPs -  Physician Assistants and Nurse Practitioners) who all work together to provide you with the care you need, when you need it.  We recommend signing up for the patient portal called "MyChart".  Sign up information is provided on this After Visit Summary.  MyChart is used to connect with patients for Virtual Visits (Telemedicine).  Patients are able to view lab/test results, encounter notes, upcoming appointments, etc.  Non-urgent messages can  be sent to your provider as well.   To learn more about what you can do with MyChart, go to [email or web address].    Your next appointment:   12 month(s)  The format for your next appointment:   In Person  Provider:   You may see Sanda Klein, MD or one of the following Advanced Practice Providers on your designated Care Team:   Almyra Deforest, PA-C Fabian Sharp, [place] or  Roby Lofts, PA-C    Signed, Sanda Klein, MD  11/18/2020 9:30 AM    [place]

## 2020-11-17 NOTE — Telephone Encounter (Signed)
-----   Message from [name], DO sent at 11/17/2020  8:35 AM EDT ----- Regarding: Please call I attempted calling this patient over the weekend, he has a lab he needs to get drawn today and I put in a lab appointment so that he can just come in and get it done. Are you able to call and just have him come in for it and let him know I will get in touch?  Thanks,  Alana Lilland

## 2020-11-17 NOTE — Telephone Encounter (Signed)
Spoke with patient informed him of his lab visit for today at 9:15. Patient said that he will be here. Salvatore Marvel, CMA

## 2020-11-18 ENCOUNTER — Encounter: Payer: Self-pay | Admitting: Cardiovascular Disease

## 2020-11-18 ENCOUNTER — Ambulatory Visit: Payer: Medicare Other | Admitting: Cardiovascular Disease

## 2020-11-18 VITALS — BP 130/62 | HR 84 | Ht 70.0 in | Wt 172.0 lb

## 2020-11-18 DIAGNOSIS — I5032 Chronic diastolic (congestive) heart failure: Secondary | ICD-10-CM

## 2020-11-18 DIAGNOSIS — N1832 Chronic kidney disease, stage 3b: Secondary | ICD-10-CM

## 2020-11-18 DIAGNOSIS — I2721 Secondary pulmonary arterial hypertension: Secondary | ICD-10-CM

## 2020-11-18 DIAGNOSIS — I483 Typical atrial flutter: Secondary | ICD-10-CM | POA: Diagnosis not present

## 2020-11-18 DIAGNOSIS — K7030 Alcoholic cirrhosis of liver without ascites: Secondary | ICD-10-CM

## 2020-11-18 LAB — BASIC METABOLIC PANEL
BUN/Creatinine Ratio: 17 (ref 10–24)
BUN: 30 mg/dL — ABNORMAL HIGH (ref 8–27)
CO2: 21 mmol/L (ref 20–29)
Calcium: 9.9 mg/dL (ref 8.6–10.2)
Chloride: 105 mmol/L (ref 96–106)
Creatinine, Ser: 1.81 mg/dL — ABNORMAL HIGH (ref 0.76–1.27)
Glucose: 145 mg/dL — ABNORMAL HIGH (ref 65–99)
Potassium: 4.9 mmol/L (ref 3.5–5.2)
Sodium: 138 mmol/L (ref 134–144)
eGFR: 38 mL/min/{1.73_m2} — ABNORMAL LOW (ref >59)

## 2020-11-18 NOTE — Patient Instructions (Signed)

## 2020-11-20 ENCOUNTER — Telehealth: Payer: Self-pay

## 2020-11-20 NOTE — Telephone Encounter (Signed)
Patient calls nurse line requesting 7/5 BMP results. Will forward to PCP.

## 2020-11-20 NOTE — Telephone Encounter (Signed)
Called patient regarding BMP, hyperkalemia improved since last check. Patient did not stop taking any of his medications, we will continue to closely monitor and consider patient returning again within the next 2 weeks to repeat labs for close potassium monitoring.   Patrena Santalucia, DO

## 2021-01-23 ENCOUNTER — Other Ambulatory Visit: Payer: Self-pay | Admitting: Family Medicine

## 2021-01-28 ENCOUNTER — Other Ambulatory Visit: Payer: Self-pay | Admitting: Family Medicine

## 2021-01-28 DIAGNOSIS — J45909 Unspecified asthma, uncomplicated: Secondary | ICD-10-CM

## 2021-02-03 ENCOUNTER — Ambulatory Visit (INDEPENDENT_AMBULATORY_CARE_PROVIDER_SITE_OTHER): Payer: Medicare Other

## 2021-02-03 ENCOUNTER — Other Ambulatory Visit: Payer: Self-pay

## 2021-02-03 DIAGNOSIS — Z23 Encounter for immunization: Secondary | ICD-10-CM | POA: Diagnosis not present

## 2021-02-17 ENCOUNTER — Telehealth: Payer: Self-pay | Admitting: Cardiovascular Disease

## 2021-02-17 MED ORDER — BISOPROLOL FUMARATE 5 MG PO TABS: ORAL_TABLET | ORAL | 6 refills | Status: DC

## 2021-02-17 NOTE — Telephone Encounter (Signed)
Spoke to DOD Dr.Tobb she advised to increase Bisoprolol to 7.5 mg daily.Advised to check B/P daily and call office on Friday 10/7 to report B/P readings.

## 2021-02-17 NOTE — Telephone Encounter (Signed)
Called patient left message on personal voice mail to call back to report B/P reading after morning meds.

## 2021-02-17 NOTE — Telephone Encounter (Signed)
Returned call to [facility] with [facility] left message on personal voice mail to call back. Spoke to patient he stated B/P was checked before he took medication this morning.Stated he just took medication.He will check B/P in 2 hours.I will call back.

## 2021-02-17 NOTE — Telephone Encounter (Signed)
New Message:    Baker Janus from [facility] is calling to report that patient's blood pressure is high this morning.    Pt c/o BP issue: STAT if pt c/o blurred vision, one-sided weakness or slurred speech  1. What are your last 5 BP readings? Today it is 191/73 at 7:27 and at 8>17 it was 196/71  2. Are you having any other symptoms (ex. Dizziness, headache, blurred vision, passed out)? No    3. What is your BP issue? High blood pressure- he have not taken his medicine at the time his pressure was taken.

## 2021-02-17 NOTE — Telephone Encounter (Signed)
Spoke to patient stated he checked B/P 2 hours after medications 193/70,136/81.Pulse 55,61.

## 2021-02-19 ENCOUNTER — Telehealth: Payer: Self-pay | Admitting: Cardiovascular Disease

## 2021-02-19 NOTE — Telephone Encounter (Signed)
No answer at this time. Left message for pt to return call.

## 2021-02-19 NOTE — Telephone Encounter (Signed)
Pt c/o BP issue: STAT if pt c/o blurred vision, one-sided weakness or slurred speech  1. What are your last 5 BP readings? This morning 188/75, 2 minutes later 196/78  2. Are you having any other symptoms (ex. Dizziness, headache, blurred vision, passed out)? Was not able to get in contact with patient  3. What is your BP issue? Madaline Savage from [facility] states the patient has a BP cuff that is bluetooth and sends his readings to them. She says his BP this morning was high and was not able to get in touch with him. She says his oxygen was 85%. She states she would like the patient to receive the call back.

## 2021-02-19 NOTE — Telephone Encounter (Signed)
Called pt he stated he has been trying to call back but has not been able to get through. Pt states "yea I know my blood pressure was high this morning." When asked has he been taking his medications like he is supposed to her states "yea I take it just like the doctor told me to." Pt took his blood pressure when on the phone. 164/67 54. Pt will take his blood pressure twice each day over the weekend and record them. I'll call him Monday to see the readings.

## 2021-02-19 NOTE — Telephone Encounter (Signed)
Called [name] to see if he was near the pt. He states the pt just left about 5 minutes ago to walk to the store. I asked [name] to have the pt call me back once he gets back. He verbalized that he would.

## 2021-02-22 ENCOUNTER — Telehealth: Payer: Self-pay | Admitting: Cardiovascular Disease

## 2021-02-22 NOTE — Telephone Encounter (Signed)
Pt was to record his blood pressure throughout the weekend and would like to report those readings:   Saturday -  183/74  hr 55                    168/55  hr 54  Sunday-  147/59  hr 54  Monday-  165/72  hr 54

## 2021-02-22 NOTE — Telephone Encounter (Signed)
The patient was calling in with an update on his blood pressure readings. He stated that his readings below were taken after he had taken his medications.  Patient stated that he was feeling good and had just gotten back from his daily walk.   Bisoprolol was recently increased to 7.5 mg once daily on 10/5

## 2021-02-23 NOTE — Telephone Encounter (Signed)
Patient returned call, he said you can leave message on his answering machine.

## 2021-02-23 NOTE — Telephone Encounter (Signed)
Attempted to reach the patient. Was unable to leave a message 

## 2021-02-23 NOTE — Telephone Encounter (Signed)
Left a message for the patient to call back.  Per Dr. Sallyanne Kuster, BP better, but not quite there yet. Stay on same dose of bisoprolol and increase spironolactone to 37.5 mg daily, please

## 2021-02-24 ENCOUNTER — Telehealth: Payer: Self-pay

## 2021-02-24 ENCOUNTER — Other Ambulatory Visit: Payer: Self-pay

## 2021-02-24 DIAGNOSIS — I10 Essential (primary) hypertension: Secondary | ICD-10-CM

## 2021-02-24 DIAGNOSIS — K7460 Unspecified cirrhosis of liver: Secondary | ICD-10-CM | POA: Diagnosis not present

## 2021-02-24 MED ORDER — SPIRONOLACTONE 25 MG PO TABS: 37.5000 mg | ORAL_TABLET | Freq: Every day | ORAL | 3 refills | Status: DC

## 2021-02-24 NOTE — Telephone Encounter (Signed)
Called pt to relay message from Dr. Sallyanne Kuster. Spoke to pt and his nephew. Both verbalized understanding. New prescription sent to pharmacy.

## 2021-02-24 NOTE — Telephone Encounter (Signed)
Called to check on pt, see what his blood pressures were over the weekend. No answer at this time, left message for him to call back.

## 2021-02-24 NOTE — Progress Notes (Signed)
Prescription sent to pharmacy.

## 2021-03-01 ENCOUNTER — Telehealth: Payer: Self-pay

## 2021-03-01 ENCOUNTER — Telehealth: Payer: Self-pay | Admitting: Cardiovascular Disease

## 2021-03-01 NOTE — Telephone Encounter (Signed)
Nurse from [facility] called to talk with a triage nurse regarding patient

## 2021-03-01 NOTE — Telephone Encounter (Signed)
Patient returned call

## 2021-03-01 NOTE — Telephone Encounter (Signed)
Appt scheduled 10/20 at 230 with pharmD.  Left message with recommendations and appt time/date (per patient request).  Advised to call with further questions/concerns. Advised to bring BP cuff to appt.

## 2021-03-01 NOTE — Telephone Encounter (Signed)
Received call from [place] with [facility] Reports patient is enrolled in CHF program with home BP monitoring and scales.  Reports this AM BP at 736 was 191/70.  States she called patient and had him recheck BP-199/68, this was one hour after AM BP meds.   Patient denied symptoms.  She reports he was educated on ER precautions and he was going to his PCP today and encouraged to take his BP cuff with him to check.   She is aware of the recent increase in bisoprolol but unsure if patient increased spironolactone to 37.5 mg as recommended on 10/12.  10/5-bisoprolol increased to 7.5 mg daily 10/12-spironolactone increased to 37.5 mg daily   Attempt to call patient, left message to call back

## 2021-03-01 NOTE — Telephone Encounter (Signed)
Patient calls nurse line requesting to schedule a follow up appointment for BP. Patient reports that he was advised per cardiologist to schedule BP appointment with PCP.   Patient has been having elevated systolic Bps in the 967'A this AM, diastolic in the 27'N-37'H.   Scheduled patient for tomorrow afternoon. ED precautions given.   [name], RN

## 2021-03-01 NOTE — Telephone Encounter (Signed)
Returned call to patient, patient reports increasing medication as recommended.   Checked BP while on the phone-192/81 HR 52.  Reports taking all his medications in the AM.  Took meds at 6 AM.   Denies anything high in sodium over the weekend-reports eating 1 meal a day, unsure what he ate Saturday but yesterday he ate baked chicken with broccoli, no added salt.  He called PCP but they have not called back.   He states he is aware of ER precautions and when to seek care.   Denies any symptoms currently.    Advised will route to MD/pharmd to review for acute/urgent recommendations.  Patient willing to come in this week for pharmD appt as well if recommended for BP follow up/ BP cuff calibration.

## 2021-03-02 ENCOUNTER — Other Ambulatory Visit: Payer: Self-pay

## 2021-03-02 ENCOUNTER — Ambulatory Visit (INDEPENDENT_AMBULATORY_CARE_PROVIDER_SITE_OTHER): Payer: Medicare Other | Admitting: Family Medicine

## 2021-03-02 DIAGNOSIS — I10 Essential (primary) hypertension: Secondary | ICD-10-CM

## 2021-03-02 DIAGNOSIS — N1832 Chronic kidney disease, stage 3b: Secondary | ICD-10-CM | POA: Diagnosis not present

## 2021-03-02 DIAGNOSIS — I5032 Chronic diastolic (congestive) heart failure: Secondary | ICD-10-CM

## 2021-03-02 NOTE — Telephone Encounter (Signed)
Spoke with the patient. He got confused by the message that was left. He has been advised to bring his blood pressure cuff to his appointment on 10/20 with the pharmacists.

## 2021-03-02 NOTE — Patient Instructions (Addendum)
You need more blood pressure medicine.  I want to wait to see the result of the blood test before I make any changes.   I will call tomorrow after I have seen the blood test and we will decide what changes to make.

## 2021-03-02 NOTE — Telephone Encounter (Signed)
Pt states he was advised to take his BP cuff to the pharmacy and trade it out but pt received that BP cuff from [facility] so he's going to have to send it back to the company. Please advise pt further

## 2021-03-03 ENCOUNTER — Encounter: Payer: Self-pay | Admitting: Family Medicine

## 2021-03-03 LAB — BASIC METABOLIC PANEL
BUN/Creatinine Ratio: 16 (ref 10–24)
BUN: 23 mg/dL (ref 8–27)
CO2: 20 mmol/L (ref 20–29)
Calcium: 9.7 mg/dL (ref 8.6–10.2)
Chloride: 107 mmol/L — ABNORMAL HIGH (ref 96–106)
Creatinine, Ser: 1.44 mg/dL — ABNORMAL HIGH (ref 0.76–1.27)
Glucose: 88 mg/dL (ref 70–99)
Potassium: 4.9 mmol/L (ref 3.5–5.2)
Sodium: 141 mmol/L (ref 134–144)
eGFR: 50 mL/min/{1.73_m2} — ABNORMAL LOW (ref >59)

## 2021-03-03 MED ORDER — LOSARTAN POTASSIUM 50 MG PO TABS: 50.0000 mg | ORAL_TABLET | Freq: Every day | ORAL | 3 refills | Status: DC

## 2021-03-03 NOTE — Progress Notes (Signed)
    SUBJECTIVE:   CHIEF COMPLAINT / HPI:   1-2 weeks of high BP readings.  Fortunately brings in all meds such that I was able to do a quality med rec.  I discovered that he was taking losartan (not previously on list) and dapagliflozin, which was on list.  Further chart review showed previous hyperkalemia and AKI thought to be medication induced.  Denies CP, DOE and leg swelling.    Known CKD 3.  In recent labs, best GFR=46 and worst GFR=32.    Known CHF - HFpEF.  No swelling or SOB    OBJECTIVE:   BP (!) 193/72   Pulse 62   Wt 166 lb 9.6 oz (75.6 kg)   SpO2 100%   BMI 23.90 kg/m   Note BP which is consistent with home BPs.  Wt noted.  Recent range high 176, low 157) Lungs clear Cardiac RRR without m or g Ext no edema.  ASSESSMENT/PLAN:   Chronic heart failure with preserved ejection fraction (HFpEF) ([place]) Asymptomatic on current meds.  CKD (chronic kidney disease) stage 3, GFR 30-59 ml/min (HCC) Recheck BMP for GFR and K+ especially since back on spironolactone and ARB Great news, K+ is normal and Creat is his best recent measure.    HYPERTENSION, BENIGN SYSTEMIC Poor control.  Await BMP before med changes.   Creat good and K+ normal.  Will increase losartan to 50 mg daily.  Patient called and informed.       Zenia Resides, MD [place]

## 2021-03-03 NOTE — Assessment & Plan Note (Signed)
Asymptomatic on current meds.

## 2021-03-03 NOTE — Assessment & Plan Note (Signed)
Poor control.  Await BMP before med changes.   Creat good and K+ normal.  Will increase losartan to 50 mg daily.  Patient called and informed.

## 2021-03-03 NOTE — Assessment & Plan Note (Signed)
Recheck BMP for GFR and K+ especially since back on spironolactone and ARB Great news, K+ is normal and Creat is his best recent measure.

## 2021-03-04 ENCOUNTER — Ambulatory Visit: Payer: Medicare Other

## 2021-03-04 ENCOUNTER — Other Ambulatory Visit: Payer: Self-pay

## 2021-03-10 ENCOUNTER — Other Ambulatory Visit: Payer: Self-pay | Admitting: Interventional Radiology

## 2021-03-10 DIAGNOSIS — Z95828 Presence of other vascular implants and grafts: Secondary | ICD-10-CM

## 2021-03-16 ENCOUNTER — Ambulatory Visit: Payer: Medicare Other

## 2021-03-17 ENCOUNTER — Encounter: Payer: Self-pay | Admitting: *Deleted

## 2021-03-17 ENCOUNTER — Ambulatory Visit
Admission: RE | Admit: 2021-03-17 | Discharge: 2021-03-17 | Disposition: A | Discharge status: Home / Self Care | Payer: Medicare Other | Source: Ambulatory Visit | Attending: Interventional Radiology | Admitting: Interventional Radiology

## 2021-03-17 DIAGNOSIS — Z95828 Presence of other vascular implants and grafts: Secondary | ICD-10-CM

## 2021-03-17 HISTORY — PX: IR RADIOLOGIST EVAL & MGMT: IMG5224

## 2021-03-17 NOTE — Progress Notes (Signed)
Patient ID: Donald Moreno, male   DOB: Jun 23, 1940, 80 y.o.   MRN: [id number]       Chief Complaint: Patient was seen in consultation today for  follow-up TIPS  at the request of Arabelle Bollig   Referring Physician(s):  Arta Silence MD   History of Present Illness: Donald Moreno is a 80 y.o. male  with a long history of cirrhosis and portal venous hypertension.  He had TIPS creation 09/30/2016 because of recurrent large volume abdominal ascites requiring multiple ultrasound-guided paracentesis procedures.   He has had no need for paracentesis since his procedure.  He has no abdominal distention.  He maintains his weight using Lasix for fluid balance.  His appetite remains good.  His energy is good.  He continues on lactulose without significant GI side effect or any evidence of hepatic encephalopathy. Past Medical History:  Diagnosis Date   Acute congestive heart failure ([place])    Arthritis    "all over"   Asthma    Blood in stool 04/11/2017   Chronic kidney disease    "I see a kidney dr @ [facility]" (10/11/2016)   Chronic lower back pain    "turned a truck over a long time ago" (10/11/2016)   Dysrhythmia    bradycardia with occassional junctional rhythm   ETOH abuse    Gout    Hypertension    Inguinal hernia 10/28/2014   S/P inguinal hernia repair with 4L as ascites removed 03/30/17   Liver cirrhosis ([place]) 2015   Macrocytosis    Pneumonia    "long long time ago" (10/11/2016)   Spontaneous bacterial peritonitis ([place]) 07/01/2015    Past Surgical History:  Procedure Laterality Date   INGUINAL HERNIA REPAIR Bilateral 03/30/2017   Procedure: OPEN BILATERAL INGUINAL HERNIA REPAIR WITH MESH;  Surgeon: Kinsinger, Arta Bruce, MD;  Location: [facility];  Service: General;  Laterality: Bilateral;  GENERAL COMBINED WITH REGIONAL FOR POST OP PAIN    INSERTION OF MESH N/A 10/16/2016   Procedure: INSERTION OF MESH;  Surgeon: Kinsinger, Arta Bruce, MD;  Location: [facility] [place] [facility];  Service: General;   Laterality: N/A;   INSERTION OF MESH Bilateral 03/30/2017   Procedure: INSERTION OF MESH;  Surgeon: Kieth Brightly Arta Bruce, MD;  Location: [facility];  Service: General;  Laterality: Bilateral;  GENERAL COMBINED WITH REGIONAL FOR POST OP PAIN    IR PARACENTESIS  08/19/2016   IR PARACENTESIS  08/26/2016   IR PARACENTESIS  09/02/2016   IR PARACENTESIS  09/09/2016   IR PARACENTESIS  09/16/2016   IR PARACENTESIS  10/03/2016   IR PARACENTESIS  10/11/2016   IR RADIOLOGIST EVAL & MGMT  09/29/2016   IR RADIOLOGIST EVAL & MGMT  12/21/2016   IR RADIOLOGIST EVAL & MGMT  01/23/2018   IR RADIOLOGIST EVAL & MGMT  02/19/2019   IR RADIOLOGIST EVAL & MGMT  03/30/2020   IR RADIOLOGIST EVAL & MGMT  03/17/2021   IR TIPS  10/11/2016   RADIOLOGY WITH ANESTHESIA N/A 10/11/2016   Procedure: TIPS;  Surgeon: Arne Cleveland, MD;  Location: [facility];  Service: Radiology;  Laterality: N/A;   UMBILICAL HERNIA REPAIR N/A 10/16/2016   Procedure: HERNIA REPAIR UMBILICAL ADULT;  Surgeon: Kinsinger, Arta Bruce, MD;  Location: [facility];  Service: General;  Laterality: N/A;    Allergies: Patient has no known allergies.  Medications: Prior to Admission medications   Medication Sig Start Date End Date Taking? Authorizing Provider  albuterol (PROAIR HFA) 108 (90 Base) MCG/ACT inhaler Inhale 2 puffs into the lungs  every 4 (four) hours as needed for wheezing. 05/24/18   Shirley, [place], DO  allopurinol (ZYLOPRIM) 100 MG tablet TAKE 1/2 TABLET BY MOUTH DAILY 10/13/20   Lilland, Alana, DO  bisoprolol (ZEBETA) 5 MG tablet Take 1&1/2 tablets ( 7.5 mg ) daily 02/17/21   Tobb, Kardie, DO  Cholecalciferol (VITAMIN D-3) 5000 units TABS Take 5,000 Units daily by mouth.     [provider]  dapagliflozin propanediol (FARXIGA) 10 MG TABS tablet Take 1 tablet (10 mg total) by mouth daily. Patient not taking: Reported on 03/02/2021 12/05/19   Leavy Cella, RPH-CPP  ferrous sulfate 325 (65 FE) MG tablet TAKE 1 TABLET BY MOUTH EVERY OTHER DAY 04/13/20    Lilland, Alana, DO  lactulose (CHRONULAC) 10 GM/15ML solution Take 15 mLs (10 g total) by mouth 2 (two) times daily as needed for mild constipation. (NEED TO ENSURE 2 SOFT BM IN A DAY.). 10/08/20   Lenoria Chime, MD  Lidocaine (HM LIDOCAINE PATCH) 4 % PTCH Apply 1 Units topically daily as needed. 10/15/20   Ezequiel Essex, MD  losartan (COZAAR) 50 MG tablet Take 1 tablet (50 mg total) by mouth at bedtime. 03/03/21   Zenia Resides, MD  montelukast (SINGULAIR) 10 MG tablet TAKE 1 TABLET BY MOUTH EVERYDAY AT BEDTIME 01/25/21   Lilland, Alana, DO  Nutritional Supplements (ENSURE COMPLETE SHAKE) LIQD Take 1 Container by mouth daily. Patient taking differently: Take 1 Container by mouth 2 (two) times daily. 03/04/15   Katheren Shams, DO  ondansetron (ZOFRAN ODT) 4 MG disintegrating tablet Take 1 tablet (4 mg total) by mouth every 8 (eight) hours as needed for nausea. 03/04/20   Larene Pickett, PA-C  spironolactone (ALDACTONE) 25 MG tablet Take 1.5 tablets (37.5 mg total) by mouth at bedtime. 02/24/21   Croitoru, Mihai, MD  WIXELA INHUB 500-50 MCG/ACT AEPB INHALE 1 PUFF BY MOUTH TWICE A DAY 01/28/21   Lilland, Alana, DO  furosemide (LASIX) 40 MG tablet TAKE 1 TAB 3 TIMES A DAY FOR WEIGHT >168LB 2 TIMES A DAY FOR WEIGHT 163-168LB ONCE DAILY FOR WEIGHT <163LB 12/24/18 07/20/19  Shirley, Martin[place], DO     Family History  Problem Relation Age of Onset   Other Mother        died when pt was only 89 - unknown cause.   Other Father        deceased.   Asthma Sister    Heart disease Sister        multiple stents.   Asthma Brother    Hypertension Brother    Asthma Son    Asthma Brother    Hypertension Brother     Social History   Socioeconomic History   Marital status: Divorced    Spouse name: Not on file   Number of children: 5   Years of education: 10   Highest education level: Not on file  Occupational History   Occupation: Retired- truck Geophysicist/field seismologist  Tobacco Use   Smoking status: Never    Smokeless tobacco: Current    Types: Snuff, Chew   Tobacco comments:    Started chewing tobacco at 80 years old  Vaping Use   Vaping Use: Never used  Substance and Sexual Activity   Alcohol use: No    Comment: quit drinking "over 2 yr ago" (10/11/2016)   Drug use: No   Sexual activity: Not Currently  Other Topics Concern   Not on file  Social History Narrative   Lives with nephew and  niece in 1-story home.     Tobacco: snuff all day (can last two days. Never smoked.       Hobbies: cooking, sleeping, going to store   Pets:  Dog, Bean   Social Determinants of Health   Financial Resource Strain: Not on file  Food Insecurity: Not on file  Transportation Needs: Not on file  Physical Activity: Not on file  Stress: Not on file  Social Connections: Not on file    ECOG Status: 1 - Symptomatic but completely ambulatory  Review of Systems: A 12 point ROS discussed and pertinent positives are indicated in the HPI above.  All other systems are negative.  Review of Systems  Vital Signs: BP (!) 191/80 (BP Location: Right Arm)   Pulse (!) 55   SpO2 98%   Physical Exam Constitutional: Oriented to person, place, and time. Well-developed and well-nourished. No distress.   HENT:  Head: Normocephalic and atraumatic.  Eyes: Conjunctivae and EOM are normal. Right eye exhibits no discharge. Left eye exhibits no discharge. No scleral icterus.  Neck: No JVD present.  Pulmonary/Chest: Effort normal. No stridor. No respiratory distress.  Abdomen: soft, non distended Neurological:  alert and oriented to person, place, and time. Hand tremor. Skin: Skin is warm and dry.  not diaphoretic.  Psychiatric:   normal mood and affect.   behavior is normal. Judgment and thought content normal. ]      Imaging: US ABDOMINAL PELVIC ART/VENT FLOW DOPPLER  Result Date: 03/17/2021 CLINICAL DATA:  Cirrhosis with recurrent large volume ascites, status post TIPS creation 10/11/2016. Routine surveillance.  EXAM: DUPLEX ULTRASOUND OF LIVER AND TIPS SHUNT TECHNIQUE: Color and duplex Doppler ultrasound was performed to evaluate the hepatic in-flow and out-flow vessels. COMPARISON:  03/23/2020 and previous FINDINGS: Portal Vein Velocities (all hepatopetal): Main:  45 cm/sec Right:  180 cm/sec Left:  57 cm/sec TIPS Stent Velocities (all antegrade) Proximal:  136 cm/sec Mid:  120 Distal:  109 cm/sec IVC: Present and patent with normal respiratory phasicity. Hepatic Vein Velocities (all hepatofugal): Right:  33 cm/sec Mid:  35 cm/sec Left:  36 cm/sec Splenic Vein: 51 cm/sec Superior Mesenteric Vein: 59 cm/sec Hepatic Artery: 131 cm/sec Ascities: Absent Varices: None seen Other findings: Heterogenous liver parenchyma. 1.9 cm simple appearing cyst centrally in the right lobe. No new liver lesion. Cholelithiasis, largest 2.5 cm. Normal gallbladder wall thickness 3 mm. CBD diameter 5 mm. Sonographer reports no sonographic Murphy's sign. IMPRESSION: 1. Continued patency of TIPS.  No complicating features. 2. Cholelithiasis. Electronically Signed   By: Lucrezia Europe M.D.   On: 03/17/2021 10:31   IR Radiologist Eval & Mgmt  Result Date: 03/17/2021 Please refer to notes tab for details about interventional procedure. (Op Note)   Labs:  CBC: Recent Labs    10/04/20 0647 10/05/20 0719  WBC 7.7 8.8  HGB 12.9* 12.6*  HCT 38.3* 37.7*  PLT 121* 137*    COAGS: Recent Labs    10/04/20 1829  INR 1.2  APTT 30    BMP: Recent Labs    10/04/20 0647 10/05/20 0719 10/15/20 1046 10/23/20 1352 11/12/20 1646 11/17/20 1015 03/02/21 1705  NA 137 134*   < > 143 143 138 141  K 4.4 4.6   < > 4.6 5.3* 4.9 4.9  CL 107 107   < > 106 108* 105 107*  CO2 23 21*   < > 19* 14* 21 20  GLUCOSE 114* 86   < > 69 89 145* 88  BUN 28*  29*   < > 24 24 30* 23  CALCIUM 9.7 9.2   < > 9.8 10.5* 9.9 9.7  CREATININE 2.02* 1.68*   < > 1.53* 2.06* 1.81* 1.44*  GFRNONAA 33* 41*  --   --   --   --   --    < > = values in this interval not  displayed.    LIVER FUNCTION TESTS: Recent Labs    10/04/20 0800 10/05/20 0719  BILITOT 1.2 2.3*  AST 49* 27  ALT 27 20  ALKPHOS 57 53  PROT 7.3 6.8  ALBUMIN 3.3* 3.0*    TUMOR MARKERS: No results for input(s): AFPTM, CEA, CA199, CHROMGRNA in the last 8760 hours.  Assessment and Plan:   My impression is that Mr. Telford is doing great post TIPS creation 4.5 years ago.  This has obviated his need for recurrent paracentesis.  He is not having any problems with hepatic encephalopathy.  The TIPS looks great on ultrasound.  I would anticipate long-term success with regards to the TIPS.  He will need continued surveillance because of his underlying cirrhosis of course.   We reviewed together today's ultrasound findings, and the expected durable function of the TIPS.  We discussed the importance of the Lasix and lactulose.  He has a good attitude about life and looks forward to every day.  He knows to call with any questions or problems.   We will plan to see him back in about a year for his usual annual surveillance ultrasound.  Thank you for this interesting consult.  I greatly enjoyed meeting Manuelito Poage and look forward to participating in their care.  A copy of this report was sent to the requesting provider on this date.  Electronically Signed: Rickard Rhymes 03/17/2021, 10:54 AM   I spent a total of    25 Minutes in face to face in clinical consultation, greater than 50% of which was counseling/coordinating care for  hepatic cirrhosis with ascites, post TIPS creation.

## 2021-03-18 ENCOUNTER — Other Ambulatory Visit: Payer: Self-pay

## 2021-03-18 ENCOUNTER — Ambulatory Visit (INDEPENDENT_AMBULATORY_CARE_PROVIDER_SITE_OTHER): Payer: Medicare Other | Admitting: Family Medicine

## 2021-03-18 VITALS — BP 122/84 | HR 59 | Ht 70.0 in | Wt 159.2 lb

## 2021-03-18 DIAGNOSIS — I10 Essential (primary) hypertension: Secondary | ICD-10-CM

## 2021-03-18 DIAGNOSIS — N1832 Chronic kidney disease, stage 3b: Secondary | ICD-10-CM | POA: Diagnosis not present

## 2021-03-18 NOTE — Assessment & Plan Note (Signed)
BP today 122/84. BMP last collected on 10/18 showed normal potassium and improved creatinine and Losartan was increased to 50mg . Will collect BMP today to ensure no hyperkalemia or creatinine rise.

## 2021-03-18 NOTE — Progress Notes (Signed)
    SUBJECTIVE:   CHIEF COMPLAINT / HPI:   Patient ports he is doing very well.  He has not checked his blood pressure in the last week because his machine has been out of batteries and they are getting them for him.  He states he has been feeling really well and has not had any dizziness (in the last 20 years), felt unsteady on his feet, or like he was going to pass out.  He recently saw his interventional radiologist and is doing well. He still has to see his cardiologist.   PERTINENT  PMH / [place]: Reviewed   OBJECTIVE:   BP 122/84   Pulse (!) 59   Ht 5\' 10"  (1.778 m)   Wt 159 lb 3.2 oz (72.2 kg)   SpO2 99%   BMI 22.84 kg/m   General: NAD, well-appearing, well-nourished Respiratory: No respiratory distress, breathing comfortably, able to speak in full sentences Skin: warm and dry, no rashes noted on exposed skin Psych: Appropriate affect and mood  ASSESSMENT/PLAN:   HYPERTENSION, BENIGN SYSTEMIC BP today 122/84. BMP last collected on 10/18 showed normal potassium and improved creatinine and Losartan was increased to 50mg . Will collect BMP today to ensure no hyperkalemia or creatinine rise.    CKD (chronic kidney disease) stage 3, GFR 30-59 ml/min (HCC) BMP on 10/18 with improved creatinine of 1.44 with GFR 50. Will recheck BMP due to change in Losartan dosing.     [name], [place]

## 2021-03-18 NOTE — Patient Instructions (Addendum)
It was so great seeing you today. Please make sure to keep taking your medications as prescribed. We are going to get another lab today just to make sure your kidneys are able to handle your new dose of your Losartan.

## 2021-03-18 NOTE — Assessment & Plan Note (Signed)
BMP on 10/18 with improved creatinine of 1.44 with GFR 50. Will recheck BMP due to change in Losartan dosing.

## 2021-03-19 ENCOUNTER — Encounter: Payer: Self-pay | Admitting: Pharmacist

## 2021-03-19 ENCOUNTER — Ambulatory Visit (INDEPENDENT_AMBULATORY_CARE_PROVIDER_SITE_OTHER): Payer: Medicare Other | Admitting: Pharmacist

## 2021-03-19 VITALS — BP 194/81 | HR 54

## 2021-03-19 DIAGNOSIS — I10 Essential (primary) hypertension: Secondary | ICD-10-CM

## 2021-03-19 DIAGNOSIS — I8500 Esophageal varices without bleeding: Secondary | ICD-10-CM | POA: Insufficient documentation

## 2021-03-19 DIAGNOSIS — Z7901 Long term (current) use of anticoagulants: Secondary | ICD-10-CM | POA: Insufficient documentation

## 2021-03-19 DIAGNOSIS — R1013 Epigastric pain: Secondary | ICD-10-CM | POA: Insufficient documentation

## 2021-03-19 DIAGNOSIS — K769 Liver disease, unspecified: Secondary | ICD-10-CM | POA: Insufficient documentation

## 2021-03-19 DIAGNOSIS — R1030 Lower abdominal pain, unspecified: Secondary | ICD-10-CM | POA: Insufficient documentation

## 2021-03-19 LAB — BASIC METABOLIC PANEL
BUN/Creatinine Ratio: 16 (ref 10–24)
BUN: 25 mg/dL (ref 8–27)
CO2: 22 mmol/L (ref 20–29)
Calcium: 9.9 mg/dL (ref 8.6–10.2)
Chloride: 107 mmol/L — ABNORMAL HIGH (ref 96–106)
Creatinine, Ser: 1.61 mg/dL — ABNORMAL HIGH (ref 0.76–1.27)
Glucose: 85 mg/dL (ref 70–99)
Potassium: 4.6 mmol/L (ref 3.5–5.2)
Sodium: 141 mmol/L (ref 134–144)
eGFR: 44 mL/min/{1.73_m2} — ABNORMAL LOW (ref >59)

## 2021-03-19 MED ORDER — AMLODIPINE BESYLATE 5 MG PO TABS
5.0000 mg | ORAL_TABLET | Freq: Every day | ORAL | 1 refills | Status: DC
Start: 2021-03-19 — End: 2022-03-16

## 2021-03-19 NOTE — Progress Notes (Signed)
Patient ID: Donald Moreno                 DOB: 1940-06-23                      MRN: [id number]     HPI: Donald Moreno is a 80 y.o. male referred by Dr. Sallyanne Kuster to HTN clinic. PMH is significant for HTN, HFpEF, liver cirrhosis, and CKD.  Seen by PCP yesterday and BP at goal.  Patient presents today in good spirits.  Ambulates with a cane.  Exercised this morning for an hour by walking around shopping center.    Currently managed on losartan 50mg , bisoprolol 7.5mg , and spironolactone 37.5mg  daily.  Brought medication bottles and verified in room.  Concerned his blood pressure cuff is not working properly because he is gettting frequent elevated readings. Had patient check BP in room.  Demonstrated proper technique: 200/68.  Rechecked manually, 192/72. Checked using office machine: 194/81.  Patient reports he had taken his medications this morning however he has consumed a lot of coffee.  Current HTN meds:  Losartan 50mg  daily Spironolactone 37.5mg  daily Bisoprolol 7.5mg  BID  BP goal: <130/80   Wt Readings from Last 3 Encounters:  03/18/21 159 lb 3.2 oz (72.2 kg)  03/02/21 166 lb 9.6 oz (75.6 kg)  11/18/20 172 lb (78 kg)   BP Readings from Last 3 Encounters:  03/18/21 122/84  03/17/21 (!) 191/80  03/02/21 (!) 193/72   Pulse Readings from Last 3 Encounters:  03/18/21 (!) 59  03/17/21 (!) 55  03/02/21 62    Renal function: Estimated Creatinine Clearance: 38.6 mL/min (A) (by C-G formula based on SCr of 1.61 mg/dL (H)).  Past Medical History:  Diagnosis Date   Acute congestive heart failure ([place])    Arthritis    "all over"   Asthma    Blood in stool 04/11/2017   Chronic kidney disease    "I see a kidney dr @ [facility]" (10/11/2016)   Chronic lower back pain    "turned a truck over a long time ago" (10/11/2016)   Dysrhythmia    bradycardia with occassional junctional rhythm   ETOH abuse    Gout    Hypertension    Inguinal hernia 10/28/2014   S/P inguinal hernia  repair with 4L as ascites removed 03/30/17   Liver cirrhosis ([place]) 2015   Macrocytosis    Pneumonia    "long long time ago" (10/11/2016)   Spontaneous bacterial peritonitis ([place]) 07/01/2015    Current Outpatient Medications on File Prior to Visit  Medication Sig Dispense Refill   albuterol (PROAIR HFA) 108 (90 Base) MCG/ACT inhaler Inhale 2 puffs into the lungs every 4 (four) hours as needed for wheezing. 1 Inhaler 2   allopurinol (ZYLOPRIM) 100 MG tablet TAKE 1/2 TABLET BY MOUTH DAILY 45 tablet 1   bisoprolol (ZEBETA) 5 MG tablet Take 1&1/2 tablets ( 7.5 mg ) daily 45 tablet 6   Cholecalciferol (VITAMIN D-3) 5000 units TABS Take 5,000 Units daily by mouth.      dapagliflozin propanediol (FARXIGA) 10 MG TABS tablet Take 1 tablet (10 mg total) by mouth daily. (Patient not taking: Reported on 03/02/2021) 90 tablet 3   ferrous sulfate 325 (65 FE) MG tablet TAKE 1 TABLET BY MOUTH EVERY OTHER DAY 45 tablet 1   lactulose (CHRONULAC) 10 GM/15ML solution Take 15 mLs (10 g total) by mouth 2 (two) times daily as needed for mild constipation. (NEED TO ENSURE 2 SOFT BM  IN A DAY.). 1892 mL 1   Lidocaine (HM LIDOCAINE PATCH) 4 % PTCH Apply 1 Units topically daily as needed. 30 patch 3   losartan (COZAAR) 50 MG tablet Take 1 tablet (50 mg total) by mouth at bedtime. 90 tablet 3   montelukast (SINGULAIR) 10 MG tablet TAKE 1 TABLET BY MOUTH EVERYDAY AT BEDTIME 90 tablet 1   Nutritional Supplements (ENSURE COMPLETE SHAKE) LIQD Take 1 Container by mouth daily. (Patient taking differently: Take 1 Container by mouth 2 (two) times daily.) 237 mL 11   ondansetron (ZOFRAN ODT) 4 MG disintegrating tablet Take 1 tablet (4 mg total) by mouth every 8 (eight) hours as needed for nausea. 10 tablet 0   spironolactone (ALDACTONE) 25 MG tablet Take 1.5 tablets (37.5 mg total) by mouth at bedtime. 135 tablet 3   WIXELA INHUB 500-50 MCG/ACT AEPB INHALE 1 PUFF BY MOUTH TWICE A DAY 180 each 1   [DISCONTINUED] furosemide (LASIX) 40  MG tablet TAKE 1 TAB 3 TIMES A DAY FOR WEIGHT >168LB 2 TIMES A DAY FOR WEIGHT 163-168LB ONCE DAILY FOR WEIGHT <163LB 270 tablet 1   No current facility-administered medications on file prior to visit.    No Known Allergies   Assessment/Plan:  1. Hypertension -  Patient BP in room 194/81 which is well above goal of <130/80.  Patient is asymptomatic. Reports he took his medications this morning but has drank multiple cups of coffee.  Instructed patient when he returns home I would contact him around 1600 and have him check BP to see if elevation was due to caffiene.  Patient had slight increase in serum creatinine after losartan increase.  However typically is elevated greater than 1.5.  Update: Spoke with patient at 4pm and had him take BP with me on the phone.  BP reading: 187/80, pulse 69.  Patient reports he feels fine.  Odd considering BP was well controlled yesterday at PCP office.  Will forward note to PCP as well.  Will start amlodipine 5mg  once daily.  Continue losartan 50mg  once daily Continue bisoprolol 7.5mg  daily Continue spironolactone 37.5mg  daily Start amlodipine 5mg  daily  Karren Cobble, PharmD, [place], [place], [place], [place], [place], [address] Phone: [phone], Fax: [phone]

## 2021-03-19 NOTE — Patient Instructions (Addendum)
It was nice meeting you today  Your blood pressure today is much higher than it was yesterday at Dr Marica Otter office  I am going to call you this afternoon and have you recheck your blood pressure to see if it comes down  Karren Cobble, PharmD, [place], [place], [place], [place], [place], [address] Phone: [phone], Fax: [phone]

## 2021-03-29 ENCOUNTER — Telehealth: Payer: Self-pay | Admitting: Cardiovascular Disease

## 2021-03-29 DIAGNOSIS — I10 Essential (primary) hypertension: Secondary | ICD-10-CM

## 2021-03-29 NOTE — Telephone Encounter (Signed)
Patient called to say that he mailed in bp results. He put in the mail on yesterday. Please advise

## 2021-03-30 NOTE — Telephone Encounter (Signed)
Noted. Will wait for the readings.

## 2021-04-06 NOTE — Telephone Encounter (Signed)
Left a message for the patient to call back.   Blood pressure readings have been received and reviewed by Dr. Sallyanne Kuster. Per Dr. Sallyanne Kuster, the patient should increase his Losartan to 100 mg once daily.

## 2021-04-07 NOTE — Telephone Encounter (Signed)
This encounter was created in error - please disregard.

## 2021-04-09 ENCOUNTER — Other Ambulatory Visit: Payer: Self-pay | Admitting: Family Medicine

## 2021-04-09 DIAGNOSIS — M109 Gout, unspecified: Secondary | ICD-10-CM

## 2021-04-13 NOTE — Telephone Encounter (Signed)
Left a message for the patient to call back.  

## 2021-04-16 NOTE — Telephone Encounter (Signed)
Left a message for the patient to call back.  

## 2021-04-17 ENCOUNTER — Emergency Department (HOSPITAL_COMMUNITY): Payer: Medicare Other

## 2021-04-17 ENCOUNTER — Other Ambulatory Visit: Payer: Self-pay

## 2021-04-17 ENCOUNTER — Encounter (HOSPITAL_COMMUNITY): Payer: Self-pay | Admitting: Student

## 2021-04-17 ENCOUNTER — Observation Stay (HOSPITAL_COMMUNITY)
Admission: EM | Admit: 2021-04-17 | Discharge: 2021-04-18 | Disposition: A | Discharge status: Home / Self Care | Payer: Medicare Other | Attending: Family Medicine | Admitting: Family Medicine

## 2021-04-17 DIAGNOSIS — R0602 Shortness of breath: Secondary | ICD-10-CM | POA: Diagnosis present

## 2021-04-17 DIAGNOSIS — J09X9 Influenza due to identified novel influenza A virus with other manifestations: Principal | ICD-10-CM | POA: Insufficient documentation

## 2021-04-17 DIAGNOSIS — I130 Hypertensive heart and chronic kidney disease with heart failure and stage 1 through stage 4 chronic kidney disease, or unspecified chronic kidney disease: Secondary | ICD-10-CM | POA: Insufficient documentation

## 2021-04-17 DIAGNOSIS — J101 Influenza due to other identified influenza virus with other respiratory manifestations: Secondary | ICD-10-CM

## 2021-04-17 DIAGNOSIS — N1832 Chronic kidney disease, stage 3b: Secondary | ICD-10-CM | POA: Diagnosis not present

## 2021-04-17 DIAGNOSIS — Z20822 Contact with and (suspected) exposure to covid-19: Secondary | ICD-10-CM | POA: Diagnosis not present

## 2021-04-17 DIAGNOSIS — I5032 Chronic diastolic (congestive) heart failure: Secondary | ICD-10-CM | POA: Insufficient documentation

## 2021-04-17 DIAGNOSIS — J45901 Unspecified asthma with (acute) exacerbation: Secondary | ICD-10-CM | POA: Insufficient documentation

## 2021-04-17 DIAGNOSIS — J09X1 Influenza due to identified novel influenza A virus with pneumonia: Secondary | ICD-10-CM | POA: Diagnosis not present

## 2021-04-17 DIAGNOSIS — J1100 Influenza due to unidentified influenza virus with unspecified type of pneumonia: Secondary | ICD-10-CM | POA: Diagnosis present

## 2021-04-17 DIAGNOSIS — J4541 Moderate persistent asthma with (acute) exacerbation: Secondary | ICD-10-CM

## 2021-04-17 DIAGNOSIS — Z79899 Other long term (current) drug therapy: Secondary | ICD-10-CM | POA: Insufficient documentation

## 2021-04-17 LAB — GLUCOSE, CAPILLARY: Glucose-Capillary: 238 mg/dL — ABNORMAL HIGH (ref 70–99)

## 2021-04-17 LAB — CBC WITH DIFFERENTIAL/PLATELET
Abs Immature Granulocytes: 0.03 10*3/uL (ref 0.00–0.07)
Basophils Absolute: 0 10*3/uL (ref 0.0–0.1)
Basophils Relative: 0 %
Eosinophils Absolute: 0.1 10*3/uL (ref 0.0–0.5)
Eosinophils Relative: 1 %
HCT: 34.1 % — ABNORMAL LOW (ref 39.0–52.0)
Hemoglobin: 11.6 g/dL — ABNORMAL LOW (ref 13.0–17.0)
Immature Granulocytes: 0 %
Lymphocytes Relative: 46 %
Lymphs Abs: 3.9 10*3/uL (ref 0.7–4.0)
MCH: 34.2 pg — ABNORMAL HIGH (ref 26.0–34.0)
MCHC: 34 g/dL (ref 30.0–36.0)
MCV: 100.6 fL — ABNORMAL HIGH (ref 80.0–100.0)
Monocytes Absolute: 0.6 10*3/uL (ref 0.1–1.0)
Monocytes Relative: 7 %
Neutro Abs: 3.9 10*3/uL (ref 1.7–7.7)
Neutrophils Relative %: 46 %
Platelets: 93 10*3/uL — ABNORMAL LOW (ref 150–400)
RBC: 3.39 MIL/uL — ABNORMAL LOW (ref 4.22–5.81)
RDW: 12.9 % (ref 11.5–15.5)
WBC: 8.5 10*3/uL (ref 4.0–10.5)
nRBC: 0 % (ref 0.0–0.2)

## 2021-04-17 LAB — COMPREHENSIVE METABOLIC PANEL
ALT: 37 U/L (ref 0–44)
AST: 68 U/L — ABNORMAL HIGH (ref 15–41)
Albumin: 2.9 g/dL — ABNORMAL LOW (ref 3.5–5.0)
Alkaline Phosphatase: 47 U/L (ref 38–126)
Anion gap: 8 (ref 5–15)
BUN: 24 mg/dL — ABNORMAL HIGH (ref 8–23)
CO2: 20 mmol/L — ABNORMAL LOW (ref 22–32)
Calcium: 8.8 mg/dL — ABNORMAL LOW (ref 8.9–10.3)
Chloride: 109 mmol/L (ref 98–111)
Creatinine, Ser: 1.75 mg/dL — ABNORMAL HIGH (ref 0.61–1.24)
GFR, Estimated: 39 mL/min — ABNORMAL LOW (ref >60)
Glucose, Bld: 95 mg/dL (ref 70–99)
Potassium: 3.9 mmol/L (ref 3.5–5.1)
Sodium: 137 mmol/L (ref 135–145)
Total Bilirubin: 1.8 mg/dL — ABNORMAL HIGH (ref 0.3–1.2)
Total Protein: 6.8 g/dL (ref 6.5–8.1)

## 2021-04-17 LAB — TROPONIN I (HIGH SENSITIVITY)
Troponin I (High Sensitivity): 21 ng/L — ABNORMAL HIGH (ref <18)
Troponin I (High Sensitivity): 21 ng/L — ABNORMAL HIGH (ref <18)

## 2021-04-17 LAB — RESP PANEL BY RT-PCR (FLU A&B, COVID) ARPGX2
Influenza A by PCR: POSITIVE — AB
Influenza B by PCR: NEGATIVE
SARS Coronavirus 2 by RT PCR: NEGATIVE

## 2021-04-17 LAB — BRAIN NATRIURETIC PEPTIDE: B Natriuretic Peptide: 565.5 pg/mL — ABNORMAL HIGH (ref 0.0–100.0)

## 2021-04-17 MED ORDER — ACETAMINOPHEN 500 MG PO TABS
500.0000 mg | ORAL_TABLET | Freq: Once | ORAL | Status: AC
  Administered 2021-04-17: 500 mg via ORAL
  Filled 2021-04-17: qty 1

## 2021-04-17 MED ORDER — OSELTAMIVIR PHOSPHATE 75 MG PO CAPS
75.0000 mg | ORAL_CAPSULE | Freq: Once | ORAL | Status: AC
  Administered 2021-04-17: 75 mg via ORAL
  Filled 2021-04-17: qty 1

## 2021-04-17 MED ORDER — POLYETHYLENE GLYCOL 3350 17 G PO PACK: 17.0000 g | PACK | Freq: Every day | ORAL | Status: DC | PRN

## 2021-04-17 MED ORDER — IPRATROPIUM-ALBUTEROL 0.5-2.5 (3) MG/3ML IN SOLN
3.0000 mL | RESPIRATORY_TRACT | Status: DC | PRN
  Administered 2021-04-17: 3 mL via RESPIRATORY_TRACT
  Filled 2021-04-17: qty 3

## 2021-04-17 MED ORDER — SODIUM CHLORIDE 0.9 % IV SOLN: 250.0000 mL | INTRAVENOUS | Status: DC | PRN

## 2021-04-17 MED ORDER — ENSURE COMPLETE SHAKE PO LIQD: 1.0000 | Freq: Two times a day (BID) | ORAL | Status: DC

## 2021-04-17 MED ORDER — ALBUTEROL SULFATE (2.5 MG/3ML) 0.083% IN NEBU
10.0000 mg | INHALATION_SOLUTION | RESPIRATORY_TRACT | Status: DC
  Administered 2021-04-17: 10 mg via RESPIRATORY_TRACT
  Filled 2021-04-17: qty 12

## 2021-04-17 MED ORDER — OSELTAMIVIR PHOSPHATE 75 MG PO CAPS: 75.0000 mg | ORAL_CAPSULE | Freq: Two times a day (BID) | ORAL | Status: DC

## 2021-04-17 MED ORDER — ENOXAPARIN SODIUM 30 MG/0.3ML IJ SOSY
30.0000 mg | PREFILLED_SYRINGE | INTRAMUSCULAR | Status: DC
  Administered 2021-04-17 – 2021-04-18 (×2): 30 mg via SUBCUTANEOUS
  Filled 2021-04-17 (×2): qty 0.3

## 2021-04-17 MED ORDER — MONTELUKAST SODIUM 10 MG PO TABS
10.0000 mg | ORAL_TABLET | Freq: Every day | ORAL | Status: DC
  Administered 2021-04-17: 10 mg via ORAL
  Filled 2021-04-17: qty 1

## 2021-04-17 MED ORDER — SODIUM CHLORIDE 0.9% FLUSH: 3.0000 mL | INTRAVENOUS | Status: DC | PRN

## 2021-04-17 MED ORDER — TAMSULOSIN HCL 0.4 MG PO CAPS: 0.4000 mg | ORAL_CAPSULE | Freq: Every day | ORAL | Status: DC

## 2021-04-17 MED ORDER — IPRATROPIUM BROMIDE 0.02 % IN SOLN
0.5000 mg | Freq: Once | RESPIRATORY_TRACT | Status: AC
  Administered 2021-04-17: 0.5 mg via RESPIRATORY_TRACT
  Filled 2021-04-17: qty 2.5

## 2021-04-17 MED ORDER — LIDOCAINE 4 % EX PTCH: 1.0000 [IU] | MEDICATED_PATCH | Freq: Every day | CUTANEOUS | Status: DC

## 2021-04-17 MED ORDER — FERROUS SULFATE 325 (65 FE) MG PO TABS
325.0000 mg | ORAL_TABLET | ORAL | Status: DC
  Administered 2021-04-18: 09:00:00 325 mg via ORAL
  Filled 2021-04-17: qty 1

## 2021-04-17 MED ORDER — LEVALBUTEROL HCL 1.25 MG/0.5ML IN NEBU
1.2500 mg | INHALATION_SOLUTION | Freq: Once | RESPIRATORY_TRACT | Status: AC
  Administered 2021-04-17: 1.25 mg via RESPIRATORY_TRACT
  Filled 2021-04-17: qty 0.5

## 2021-04-17 MED ORDER — MOMETASONE FURO-FORMOTEROL FUM 200-5 MCG/ACT IN AERO
2.0000 | INHALATION_SPRAY | Freq: Two times a day (BID) | RESPIRATORY_TRACT | Status: DC
  Administered 2021-04-17 – 2021-04-18 (×2): 2 via RESPIRATORY_TRACT
  Filled 2021-04-17: qty 8.8

## 2021-04-17 MED ORDER — BISOPROLOL FUMARATE 5 MG PO TABS
7.5000 mg | ORAL_TABLET | Freq: Every day | ORAL | Status: DC
  Administered 2021-04-17 – 2021-04-18 (×2): 7.5 mg via ORAL
  Filled 2021-04-17 (×2): qty 1.5

## 2021-04-17 MED ORDER — FUROSEMIDE 10 MG/ML IJ SOLN
20.0000 mg | Freq: Once | INTRAMUSCULAR | Status: AC
  Administered 2021-04-17: 20 mg via INTRAVENOUS
  Filled 2021-04-17: qty 2

## 2021-04-17 MED ORDER — OSELTAMIVIR PHOSPHATE 30 MG PO CAPS
30.0000 mg | ORAL_CAPSULE | Freq: Two times a day (BID) | ORAL | Status: DC
  Administered 2021-04-17 – 2021-04-18 (×2): 30 mg via ORAL
  Filled 2021-04-17 (×3): qty 1

## 2021-04-17 MED ORDER — ENSURE ENLIVE PO LIQD
237.0000 mL | Freq: Two times a day (BID) | ORAL | Status: DC
Start: 2021-04-17 — End: 2021-04-18
  Administered 2021-04-17 – 2021-04-18 (×3): 237 mL via ORAL
  Filled 2021-04-17: qty 237

## 2021-04-17 MED ORDER — SPIRONOLACTONE 25 MG PO TABS
37.5000 mg | ORAL_TABLET | Freq: Every day | ORAL | Status: DC
  Administered 2021-04-18: 09:00:00 37.5 mg via ORAL
  Filled 2021-04-17: qty 1

## 2021-04-17 MED ORDER — LIDOCAINE 5 % EX PTCH
1.0000 | MEDICATED_PATCH | Freq: Every day | CUTANEOUS | Status: DC
  Administered 2021-04-17: 1 via TRANSDERMAL
  Filled 2021-04-17: qty 1

## 2021-04-17 MED ORDER — SODIUM CHLORIDE 0.9% FLUSH
3.0000 mL | Freq: Two times a day (BID) | INTRAVENOUS | Status: DC
  Administered 2021-04-17 – 2021-04-18 (×2): 3 mL via INTRAVENOUS

## 2021-04-17 MED ORDER — AMLODIPINE BESYLATE 5 MG PO TABS
5.0000 mg | ORAL_TABLET | Freq: Every day | ORAL | Status: DC
  Administered 2021-04-17 – 2021-04-18 (×2): 5 mg via ORAL
  Filled 2021-04-17 (×2): qty 1

## 2021-04-17 NOTE — Plan of Care (Deleted)
  Problem: Education: Goal: Knowledge of General Education information will improve Description: Including pain rating scale, medication(s)/side effects and non-pharmacologic comfort measures 04/17/2021 2331 by Garner Gavel, RN Outcome: Progressing 04/17/2021 2131 by Garner Gavel, RN Outcome: Progressing   Problem: Activity: Goal: Risk for activity intolerance will decrease 04/17/2021 2331 by Garner Gavel, RN Outcome: Progressing 04/17/2021 2131 by Garner Gavel, RN Outcome: Progressing   Problem: Elimination: Goal: Will not experience complications related to bowel motility Outcome: Progressing Goal: Will not experience complications related to urinary retention Outcome: Progressing   Problem: Safety: Goal: Ability to remain free from injury will improve 04/17/2021 2331 by Garner Gavel, RN Outcome: Progressing 04/17/2021 2131 by Garner Gavel, RN Outcome: Progressing

## 2021-04-17 NOTE — ED Triage Notes (Signed)
Pt here via EMS d/t SOB X1 hour. Wheezing. Cold like symptoms X3 days  5mg  albuterol X2 5mg  Atrovent without relief. X2 125mg  solumedrol

## 2021-04-17 NOTE — ED Provider Notes (Signed)
Emergency Medicine Provider Triage Evaluation Note  Donald Moreno , a 80 y.o. male  was evaluated in triage.  Pt complains of shortness of breath.  He had an hour of shob.  He had 5mg  of albuterol and 0.5mg  of atrovent by ems.  He had wheezing in all fields per EMS.  He has been sick for about 3 days.  Got 125mg  solumederol by ems.   Review of Systems  Positive: Shortness of breath Negative: Syncope, leg swelling  Physical Exam  BP (!) 148/69 (BP Location: Right Arm)   Pulse (!) 114   Temp 99.2 F (37.3 C) (Oral)   Resp (!) 28   SpO2 100%  Gen:   Awake, increased work of breathing Resp:  Diffuse wheezes and rhonchi bilaterally.  Increased work of breathing MSK:   Moves extremities without difficulty  Other:  No extremity edema.   Medical Decision Making  Medically screening exam initiated at 3:22 AM.  Appropriate orders placed.  [place] Grosser was informed that the remainder of the evaluation will be completed by another provider, this initial triage assessment does not replace that evaluation, and the importance of remaining in the ED until their evaluation is complete.     Lorin Glass, [place] 04/17/21 6468    Merrily Pew, MD 04/17/21 2259

## 2021-04-17 NOTE — Progress Notes (Signed)
Family medicine teaching service will be admitting this patient. Our pager information can be located in the physician sticky notes, treatment team sticky notes, and the headers of all our official daily progress notes.   FAMILY MEDICINE TEACHING SERVICE Patient - Please contact intern pager [phone] or text page via website [email or web address] ([email or web address]) for questions regarding care. DO NOT page listed attending provider unless there is no answer from the number above.   Julisa Flippo, MD PGY-2, [facility] pager [phone]   

## 2021-04-17 NOTE — Progress Notes (Signed)
PHARMACY NOTE:  ANTIMICROBIAL RENAL DOSAGE ADJUSTMENT  Current antimicrobial regimen includes a mismatch between antimicrobial dosage and estimated renal function.  As per policy approved by the Pharmacy & Therapeutics and Medical Executive Committees, the antimicrobial dosage will be adjusted accordingly.  Current antimicrobial dosage:  Oseltamivir 75 mg BID X 9 doses  Indication:  Influenza Treatment  Renal Function:  Estimated Creatinine Clearance: 34.3 mL/min (A) (by C-G formula based on SCr of 1.75 mg/dL (H)). []      On intermittent HD, scheduled: []      On CRRT    Antimicrobial dosage has been changed to:  Oseltamivir 30 mg BID X 9 doses  Thank you for allowing pharmacy to be a part of this patient's care.  Gillermina Hu, PharmD, BCPS, [facility] Clinical Pharmacist 04/17/2021 7:04 PM

## 2021-04-17 NOTE — ED Notes (Signed)
Roderic Palau nephew [phone] requesting to speak to the patient

## 2021-04-17 NOTE — Hospital Course (Addendum)
Donald Moreno is a 80 y.o. male presenting with Shortness of Breath. Donald Moreno is a 80 y.o. male presenting with Shortness of Breath. PMHx of asthma, HFpEF (09/2020), CKD 3B, liver cirrhosis status post TIPS (2018), history of SBP (2010), mild thrombocytopenia, remote history of EtOH abuse-7 years in remission, hypertension, macrocytic anemia, gout, chronic low back pain, arthritis.   ED Course: Patient had cold-like sxs x3days but called EMS for SOB x1hr with wheezing. He was found to be flu +. EMS gave him 5mg  albuterol X2, 5mg  Atrovent without relief X2 and 125mg  solumedrol.  BNP was also elevated at 565.  Patient had crackles in right lower lung fields with diffuse wheezing. BP (!) 148/69 (BP Location: Right Arm)   Pulse (!) 114   Temp 99.2 F (37.3 C) (Oral)   Resp (!) 28   SpO2 100%  Gen:                Awake, increased work of breathing Resp:               Diffuse wheezes and rhonchi bilaterally.  Increased work of breathing   Brief Hospital Course: Patient presented to the emergency department via EMS having shortness of breath for approximately 1 hour prior to arrival and flu symptoms for 3 days.  He was given Solu-Medrol, Atrovent, albuterol.  He was tachypneic and tachycardic.  He was placed on DuoNebs.  Chest x-ray showed acute bronchopneumonia with no pleural effusions.  Viral panel showed positive for flu.  BNP elevated at 565.  Patient's respiratory status greatly improved with these treatments with his O2 improving to 100% on room air with no respiratory distress.  Patient was started on Tamiflu as well as duo nebs were continued every 4 hours as needed.  Overnight patient's respiratory status continues to improve.  He started on prednisone as well.  He is ambulated with pulse ox showing 97% on room air with ambulation and 99% on room air at rest.  PT and OT evaluated the patient and recommended home PT/OT.  Patient was discharged home with strict return precautions.  Plan by  Problem: Respiratory distress 2/2 flu, acute asthma exacerbation Patient presented in acute respiratory distress.  Was placed on O2 nasal cannula and given albuterol, Atrovent, Solu-Medrol.  On secondary evaluation there was noted a mild cough with sputum production as well as rhonchi and wheezing.  Prednisone 40 mg daily was initiated as well as Tamiflu 30 mg twice daily.  Throughout the day his respirations improved.  He was seen by occupational therapy recommended home OT.  He was seen by physical therapy who recommended home health PT.  He was ambulated with pulse ox and O2 saturation was 97% while ambulating on room air and 99% at rest on room air.  Given his great improvement and oxygenation and desire to go home patient was discharged.  Orders were placed for home health PT and OT.  Elevated creatinine Patient's baseline creatinine appears to be between 1.4 and 1.9.  On arrival was 1.75.  Patient with CKD stage IIIb.  Diuresed patient overnight and creatinine improved to 1.62.  Euvolemic on exam prior to discharge.  HFpEF Patient with history of HFpEF with LVEF of 55-60% (09/2020).  On arrival he appeared mildly volume overloaded.  He was given 1 dose of Lasix in the emergency department.  After admission he was continued on his home beta-blocker.  Echocardiogram showed stable HFpEF with EF 50-55%.  Patient was discharged home on home regimen  of Aldactone and bisoprolol.  Can resume [place] as well.   Issues for Follow Up:  Monitor fluid status and assess need for diuretics Repeat BMP at kidney function Follow-up with cardiology Follow-up PA and lateral chest x-ray recommended 3/4 weeks following treatment to ensure resolution and exclude underlying malignancy, order will need to be placed at hospital follow-up  Significant Procedures:  None

## 2021-04-17 NOTE — H&P (Addendum)
[facility] Admission History and Physical Service Pager: [phone]  [name] Medical record number: [id number] Date of birth: 1941-04-10 Age: 80 y.o. Gender: male  Primary Care Provider: [name], DO Consultants: None Code Status: DNR Preferred Emergency Contact: Roderic Palau (nephew) [phone], Niece Roanna Banning [phone]  Chief Complaint: Shortness of Breath  Assessment and Plan: [name] is a 80 y.o. male presenting with Shortness of Breath. PMHx of asthma, HFpEF (09/2020), CKD 3B, liver cirrhosis status post TIPS (2018), history of SBP (2010), mild thrombocytopenia, remote history of EtOH abuse-7 years in remission, hypertension, macrocytic anemia, gout, chronic low back pain, arthritis.  FluA(+) PNA-RLL  Asthma exacerbation s/p Solu-Medrol 125 mg Presented via EMS for SOB and wheezing x1 hour after having cold-like symptoms x3 days. EMS gave him 5mg  albuterol X2, 5mg  Atrovent without relief X2 and 125mg  solumedrol. In the ED, he was tachypneic (RR 28), tachycardic (HR 114), low-grade fever of 99.63F, and respiratory distress with diffused wheezing that was treated with 8L of continuous DuoNeb. CXR showed evidence of chronic lung disease with increased reticulonodular opacity at the right lung base compatible with acute bronchopneumonia and no pleural effusion. Labs showed he is COVID(-), no leukocytosis, high-sensitivity troponin negative x2, BNP elevated at 565. VBG collected at the time, currently pending. On encounter patient's SPO2 100% on RA and in no respiratory distress.  Physical exam was pertinent for RLL crackles and diffuse wheezing on auscultation.  Unilateral 1+ pitting edema on RLE, patient described it as chronic. Low suspicion for DVT as there was no pain or tenderness, and as patient reported it is chronic.  Patient reported that this felt like a past asthma exacerbation.  He endorsed med compliance and uses  inhaler daily BID. Patient admitted to cardiac telemetry under attending Dr. Thompson Grayer Droplet precautions for flu Delirium precautions Vitals per unit routine PT/OT eval and treat Diet: Heart healthy CBC and BMP in the a.m. We will restart appropriate home meds after med rec Follow-up pending VBG Oseltamivir 25 mg x 1 dose DuoNeb every 4 hours as needed  Acute Respiratory distress  HFpEF (09/2020)  Atrial flutter and bradycardia Patient also endorses recent orthopnea (requiring 3 pillows), SOB, and bibasilar crackles (R>L). BNP >565. HFpEF is managed by Dr. Sallyanne Kuster at [facility]. Home meds: Losartan 50mg , bisoprolol 7.5mg , and spironolactone 37.5mg  daily. Dry wt ~72 kg, baseline Cr ~1.5. Last Cr (!) 1.75 and K+ 3.9.  EKG showed atrial flutter with rates 110s, QTc 460s.  Echo completed today, pending read.  Currently no admission weight reported, we will recheck later. Strict I/Os and daily weights BMP and Mg2+ daily K+ >2, Mg2+ >4, replete as needed Continuous cardiac monitoring S/p IV Lasix 20 mg in the ED Follow-up echo read Follow-up on initial weight We will restart appropriate home meds after med rec  AKI  CKD 3B Cr (!) 1.75. Baseline Cr ~ 1.5, per outpatient note.  However patient's Cr seems range from 1.4-1.9, that this may be patient's baseline.  We will continue to monitor. Diurese per above Trend creatinine  Elevated AST  Thrombocytopenia  Hx liver cirrhosis s/p TIPS 2018  hx of SBP (2010)  Remote hx of EtOH abuse, 2yrs in remission  Elevated AST 68, PLT 93. Per chart, PLT seems to run around 120s-130s, last measured in May. Will obtain baseline INR. Follow-up INR DC VTE for PTL <50  HTN Stable. SBP 130s-150s, DBP 70s. Started amlodipine outpatient 11/4. BP goal: <130/80 We will restart any other  appropriate home meds after med rec  Macrocytic anemia On admission, Hb 11.6, MCV 100.6. Hb 12.6 & 12.9 in May.  Ordered B12 and folate level, will replete as  necessary. Follow-up CBC Follow-up B12 level Follow-up folate level We will restart appropriate home meds after med rec  Arthritis  gout Home meds - allopurinol 50 mg QD We will restart appropriate home meds after med rec  Chronic low back pain Home meds - lidocaine patch We will restart appropriate home meds after med rec  BPH Patient endorses waking up to urinate ~7 times a night.  Tamsulosin 0.4mg  qHS   FEN/GI: Heart healthy Prophylaxis: Enoxaparin (lovenox) injection 30 mg start: 04/17/21 1300   Disposition: Med tele  History of Present Illness:   Donald Moreno is a 80 y.o. male presenting with shortness of breath.  Patient had SOB x3 days, felt as though he had a cold. Reports last influenza illness 3 years ago. No on oxygen at home and ambulates freely without DOE normally.  Reports he has an asthma inhaler, takes it regularly. Says he takes his medicines every day: asthma pill, BP pill.   Does not know dry weight, does not appear familiar with this concept. Last weight 156 lb on home scale, says this is lower than normal.   Does not drink alcohol anymore, quit drinking hard alcohol 47 years ago, quit drinking beer about 7-8 years ago. No alcohol at all in the last 7 years.   Denies smoking. Does dip, loose leaf, 3 times daily. Declines nicotine patch while in the hospital.   Surgical hx: reports cholecystectomy, but not in chart - multiple paracentesis - TIPS 2018 - inguinal hernia repair  Review Of Systems:  Per HPI with the following additions: Review of Systems  Constitutional:  Positive for fatigue and fever. Negative for chills and diaphoresis.  Respiratory:  Positive for cough, shortness of breath and wheezing.   Cardiovascular:  Negative for chest pain.  Gastrointestinal:  Negative for abdominal distention, abdominal pain, blood in stool, constipation, diarrhea, nausea and vomiting.  Genitourinary:  Negative for difficulty urinating, dysuria and  hematuria.  Musculoskeletal:  Positive for back pain.  Neurological:  Negative for dizziness, seizures, light-headedness, numbness and headaches.    Patient Active Problem List   Diagnosis Date Noted   Influenza A with pneumonia 04/17/2021   SOB (shortness of breath) 04/17/2021   Pneumonia and influenza 04/17/2021   Epigastric pain 03/19/2021   Esophageal varices ([place]) 03/19/2021   Groin pain 03/19/2021   Lesion of liver 03/19/2021   Long term (current) use of anticoagulants 03/19/2021   Avascular necrosis of bone of left hip ([place]) 07/02/2020   Tinnitus 11/19/2019   History of anemia due to CKD 02/20/2018   Secondary hyperparathyroidism of renal origin ([place]) 02/20/2018   Macrocytic anemia 04/20/2017   Chronic heart failure with preserved ejection fraction (HFpEF) ([place])    Hematuria 35/36/1443   Alcoholic cirrhosis of liver without ascites ([place])    Typical atrial flutter ([place])    Abdominal pain 07/01/2015   CKD (chronic kidney disease) stage 3, GFR 30-59 ml/min ([place]) 05/27/2015   At risk for decreased bone density 03/04/2015   Bradycardia 07/08/2013   Advanced directives, counseling/discussion 01/03/2011   Asthma 01/29/2010   Gout 07/13/2006   HYPERTENSION, BENIGN SYSTEMIC 07/13/2006   Arthritis 07/13/2006    Past Medical History: Past Medical History:  Diagnosis Date   Acute congestive heart failure ([place])    Arthritis    "all over"  Asthma    Blood in stool 04/11/2017   Chronic kidney disease    "I see a kidney dr @ [facility]" (10/11/2016)   Chronic lower back pain    "turned a truck over a long time ago" (10/11/2016)   Dysrhythmia    bradycardia with occassional junctional rhythm   ETOH abuse    Gout    Hypertension    Inguinal hernia 10/28/2014   S/P inguinal hernia repair with 4L as ascites removed 03/30/17   Liver cirrhosis ([place]) 2015   Macrocytosis    Pneumonia    "long long time ago" (10/11/2016)   Spontaneous bacterial peritonitis ([place]) 07/01/2015     Past Surgical History: Past Surgical History:  Procedure Laterality Date   INGUINAL HERNIA REPAIR Bilateral 03/30/2017   Procedure: OPEN BILATERAL INGUINAL HERNIA REPAIR WITH MESH;  Surgeon: Kinsinger, Arta Bruce, MD;  Location: [facility];  Service: General;  Laterality: Bilateral;  GENERAL COMBINED WITH REGIONAL FOR POST OP PAIN    INSERTION OF MESH N/A 10/16/2016   Procedure: INSERTION OF MESH;  Surgeon: Kinsinger, Arta Bruce, MD;  Location: [facility];  Service: General;  Laterality: N/A;   INSERTION OF MESH Bilateral 03/30/2017   Procedure: INSERTION OF MESH;  Surgeon: Kieth Brightly Arta Bruce, MD;  Location: [facility];  Service: General;  Laterality: Bilateral;  GENERAL COMBINED WITH REGIONAL FOR POST OP PAIN    IR PARACENTESIS  08/19/2016   IR PARACENTESIS  08/26/2016   IR PARACENTESIS  09/02/2016   IR PARACENTESIS  09/09/2016   IR PARACENTESIS  09/16/2016   IR PARACENTESIS  10/03/2016   IR PARACENTESIS  10/11/2016   IR RADIOLOGIST EVAL & MGMT  09/29/2016   IR RADIOLOGIST EVAL & MGMT  12/21/2016   IR RADIOLOGIST EVAL & MGMT  01/23/2018   IR RADIOLOGIST EVAL & MGMT  02/19/2019   IR RADIOLOGIST EVAL & MGMT  03/30/2020   IR RADIOLOGIST EVAL & MGMT  03/17/2021   IR TIPS  10/11/2016   RADIOLOGY WITH ANESTHESIA N/A 10/11/2016   Procedure: TIPS;  Surgeon: Arne Cleveland, MD;  Location: [facility];  Service: Radiology;  Laterality: N/A;   UMBILICAL HERNIA REPAIR N/A 10/16/2016   Procedure: HERNIA REPAIR UMBILICAL ADULT;  Surgeon: Kinsinger, Arta Bruce, MD;  Location: [facility];  Service: General;  Laterality: N/A;    Social History: Social History   Tobacco Use   Smoking status: Never   Smokeless tobacco: Current    Types: Snuff, Chew   Tobacco comments:    Started chewing tobacco at 80 years old  Vaping Use   Vaping Use: Never used  Substance Use Topics   Alcohol use: No    Comment: quit drinking "over 2 yr ago" (10/11/2016)   Drug use: No   Please also refer to relevant sections of EMR.  Family  History: Family History  Problem Relation Age of Onset   Other Mother        died when pt was only 61 - unknown cause.   Other Father        deceased.   Asthma Sister    Heart disease Sister        multiple stents.   Asthma Brother    Hypertension Brother    Asthma Son    Asthma Brother    Hypertension Brother    Allergies and Medications: No Known Allergies No current facility-administered medications on file prior to encounter.   Current Outpatient Medications on File Prior to Encounter  Medication Sig Dispense Refill  albuterol (PROAIR HFA) 108 (90 Base) MCG/ACT inhaler Inhale 2 puffs into the lungs every 4 (four) hours as needed for wheezing. 1 Inhaler 2   allopurinol (ZYLOPRIM) 100 MG tablet TAKE 1/2 TABLET BY MOUTH EVERY DAY (Patient taking differently: Take 50 mg by mouth daily.) 45 tablet 1   amLODipine (NORVASC) 5 MG tablet Take 1 tablet (5 mg total) by mouth daily. 180 tablet 1   bisoprolol (ZEBETA) 5 MG tablet Take 1&1/2 tablets ( 7.5 mg ) daily (Patient taking differently: Take 7.5 mg by mouth daily. Take 1&1/2 tablets ( 7.5 mg ) daily) 45 tablet 6   Cholecalciferol (VITAMIN D-3) 5000 units TABS Take 5,000 Units daily by mouth.      ferrous sulfate 325 (65 FE) MG tablet TAKE 1 TABLET BY MOUTH EVERY OTHER DAY 45 tablet 1   lactulose (CHRONULAC) 10 GM/15ML solution Take 15 mLs (10 g total) by mouth 2 (two) times daily as needed for mild constipation. (NEED TO ENSURE 2 SOFT BM IN A DAY.). 1892 mL 1   losartan (COZAAR) 50 MG tablet Take 1 tablet (50 mg total) by mouth at bedtime. 90 tablet 3   montelukast (SINGULAIR) 10 MG tablet TAKE 1 TABLET BY MOUTH EVERYDAY AT BEDTIME (Patient taking differently: Take 10 mg by mouth at bedtime.) 90 tablet 1   spironolactone (ALDACTONE) 25 MG tablet Take 1.5 tablets (37.5 mg total) by mouth at bedtime. 135 tablet 3   WIXELA INHUB 500-50 MCG/ACT AEPB INHALE 1 PUFF BY MOUTH TWICE A DAY (Patient taking differently: 1 puff in the morning and  at bedtime.) 180 each 1   dapagliflozin propanediol (FARXIGA) 10 MG TABS tablet Take 1 tablet (10 mg total) by mouth daily. (Patient not taking: Reported on 03/02/2021) 90 tablet 3   Lidocaine (HM LIDOCAINE PATCH) 4 % PTCH Apply 1 Units topically daily as needed. 30 patch 3   Nutritional Supplements (ENSURE COMPLETE SHAKE) LIQD Take 1 Container by mouth daily. (Patient taking differently: Take 1 Container by mouth 2 (two) times daily.) 237 mL 11   ondansetron (ZOFRAN ODT) 4 MG disintegrating tablet Take 1 tablet (4 mg total) by mouth every 8 (eight) hours as needed for nausea. (Patient not taking: Reported on 04/17/2021) 10 tablet 0   [DISCONTINUED] furosemide (LASIX) 40 MG tablet TAKE 1 TAB 3 TIMES A DAY FOR WEIGHT >168LB 2 TIMES A DAY FOR WEIGHT 163-168LB ONCE DAILY FOR WEIGHT <163LB 270 tablet 1    Objective: BP 132/71   Pulse 96   Temp 99.2 F (37.3 C) (Oral)   Resp (!) 21   SpO2 100%  Exam: Physical Exam Vitals and nursing note reviewed.  Constitutional:      General: He is not in acute distress.    Appearance: He is not ill-appearing, toxic-appearing or diaphoretic.  HENT:     Head: Normocephalic and atraumatic.  Cardiovascular:     Rate and Rhythm: Normal rate. No extrasystoles are present.    Pulses: No decreased pulses.     Heart sounds: No murmur heard.   No friction rub. No gallop.  Pulmonary:     Effort: No tachypnea, bradypnea, accessory muscle usage or respiratory distress.     Breath sounds: No stridor. Examination of the right-middle field reveals rales. Examination of the right-lower field reveals rales. Examination of the left-lower field reveals rales. Wheezing (Diffused) and rales present. No decreased breath sounds.  Abdominal:     General: Bowel sounds are normal.     Palpations: Abdomen is soft.  Musculoskeletal:     Right lower leg: Edema (1+ pitting edema at the level of the ankle) present.  Skin:    General: Skin is warm and dry.  Neurological:      Mental Status: He is alert and oriented to person, place, and time.   Labs and Imaging: CBC BMET  Recent Labs  Lab 04/17/21 0332  WBC 8.5  HGB 11.6*  HCT 34.1*  PLT 93*   Recent Labs  Lab 04/17/21 0332  NA 137  K 3.9  CL 109  CO2 20*  BUN 24*  CREATININE 1.75*  GLUCOSE 95  CALCIUM 8.8*     EKG: atrial flutter with rates 110s, QTc 460s   DG Chest Port 1 View  Result Date: 04/17/2021 CLINICAL DATA:  80 year old male with shortness of breath for 1 hour. Wheezing. Cold symptoms for 3 days. EXAM: PORTABLE CHEST 1 VIEW COMPARISON:  Chest radiographs 10/04/2020 and earlier. FINDINGS: Portable AP upright view at 0401 hours. Stable lung volumes and mediastinal contours. Visualized tracheal air column is within normal limits. Left lung markings are stable. Acute on chronic interstitial opacity tracking from the right hilum to the right lung base. No consolidation or pleural effusion. No pneumothorax. Stable mild architectural distortion along the right middle fissure. No acute osseous abnormality identified. Negative visible bowel gas. IMPRESSION: Evidence of chronic lung disease with increased reticulonodular opacity at the right lung base compatible with acute bronchopneumonia. No pleural effusion. Followup PA and lateral chest X-ray is recommended in 3-4 weeks following trial of antibiotic therapy to ensure resolution and exclude underlying malignancy. Electronically Signed   By: Genevie Ann M.D.   On: 04/17/2021 04:20     Merrily Brittle, DO 04/17/2021, 3:24 PM PGY-1, [facility] pager: [phone], text pages welcome   Upper Level Addendum: I have seen and evaluated this patient along with Dr. Alfonse Spruce and reviewed the above note, making necessary revisions as appropriate. I agree with the medical decision making and physical exam as noted above. Ezequiel Essex, MD PGY-2 [facility] Family Medicine Residency

## 2021-04-17 NOTE — ED Provider Notes (Addendum)
[facility] EMERGENCY DEPARTMENT Provider Note  CSN: [id number] Arrival date & time: 04/17/21 0258  Chief Complaint(s) Shortness of Breath  HPI Donald Moreno is a 80 y.o. male with a past medical history listed below who presents to the emergency department with 2 to 3 days of gradually worsening shortness of breath that became severe today.  Similar to prior episodes of asthma exacerbation.  Severity prompted call to EMS who noted patient had diffuse wheezing throughout.  He was given Solu-Medrol and DuoNeb.  Patient reports improved shortness of breath after the breathing treatment.  Still still feels short of breath though.  He denies any peripheral edema.  He reports productive cough.  No chest pain.  No known sick contacts.  No other physical complaints.  HPI  Past Medical History Past Medical History:  Diagnosis Date   Acute congestive heart failure ([place])    Arthritis    "all over"   Asthma    Blood in stool 04/11/2017   Chronic kidney disease    "I see a kidney dr @ [facility]" (10/11/2016)   Chronic lower back pain    "turned a truck over a long time ago" (10/11/2016)   Dysrhythmia    bradycardia with occassional junctional rhythm   ETOH abuse    Gout    Hypertension    Inguinal hernia 10/28/2014   S/P inguinal hernia repair with 4L as ascites removed 03/30/17   Liver cirrhosis ([place]) 2015   Macrocytosis    Pneumonia    "long long time ago" (10/11/2016)   Spontaneous bacterial peritonitis ([place]) 07/01/2015   Patient Active Problem List   Diagnosis Date Noted   Epigastric pain 03/19/2021   Esophageal varices ([place]) 03/19/2021   Groin pain 03/19/2021   Lesion of liver 03/19/2021   Long term (current) use of anticoagulants 03/19/2021   Avascular necrosis of bone of left hip ([place]) 07/02/2020   Tinnitus 11/19/2019   History of anemia due to CKD 02/20/2018   Secondary hyperparathyroidism of renal origin ([place]) 02/20/2018   Macrocytic anemia 04/20/2017    Chronic heart failure with preserved ejection fraction (HFpEF) (HCC)    Hematuria 57/05/7791   Alcoholic cirrhosis of liver without ascites ([place])    Typical atrial flutter ([place])    Abdominal pain 07/01/2015   CKD (chronic kidney disease) stage 3, GFR 30-59 ml/min ([place]) 05/27/2015   At risk for decreased bone density 03/04/2015   Bradycardia 07/08/2013   Advanced directives, counseling/discussion 01/03/2011   Asthma 01/29/2010   Gout 07/13/2006   HYPERTENSION, BENIGN SYSTEMIC 07/13/2006   Arthritis 07/13/2006   Home Medication(s) Prior to Admission medications   Medication Sig Start Date End Date Taking? Authorizing Provider  albuterol (PROAIR HFA) 108 (90 Base) MCG/ACT inhaler Inhale 2 puffs into the lungs every 4 (four) hours as needed for wheezing. 05/24/18   Shirley, [place], DO  allopurinol (ZYLOPRIM) 100 MG tablet TAKE 1/2 TABLET BY MOUTH EVERY DAY 04/13/21   Lilland, Alana, DO  amLODipine (NORVASC) 5 MG tablet Take 1 tablet (5 mg total) by mouth daily. 03/19/21   Croitoru, Mihai, MD  bisoprolol (ZEBETA) 5 MG tablet Take 1&1/2 tablets ( 7.5 mg ) daily 02/17/21   Tobb, Kardie, DO  Cholecalciferol (VITAMIN D-3) 5000 units TABS Take 5,000 Units daily by mouth.     [provider]  dapagliflozin propanediol (FARXIGA) 10 MG TABS tablet Take 1 tablet (10 mg total) by mouth daily. Patient not taking: Reported on 03/02/2021 12/05/19   Leavy Cella,  RPH-CPP  ferrous sulfate 325 (65 FE) MG tablet TAKE 1 TABLET BY MOUTH EVERY OTHER DAY 04/13/20   Lilland, Alana, DO  lactulose (CHRONULAC) 10 GM/15ML solution Take 15 mLs (10 g total) by mouth 2 (two) times daily as needed for mild constipation. (NEED TO ENSURE 2 SOFT BM IN A DAY.). 10/08/20   Lenoria Chime, MD  Lidocaine (HM LIDOCAINE PATCH) 4 % PTCH Apply 1 Units topically daily as needed. 10/15/20   Ezequiel Essex, MD  losartan (COZAAR) 50 MG tablet Take 1 tablet (50 mg total) by mouth at bedtime. 03/03/21   Zenia Resides, MD   montelukast (SINGULAIR) 10 MG tablet TAKE 1 TABLET BY MOUTH EVERYDAY AT BEDTIME 01/25/21   Lilland, Alana, DO  Nutritional Supplements (ENSURE COMPLETE SHAKE) LIQD Take 1 Container by mouth daily. Patient taking differently: Take 1 Container by mouth 2 (two) times daily. 03/04/15   Katheren Shams, DO  ondansetron (ZOFRAN ODT) 4 MG disintegrating tablet Take 1 tablet (4 mg total) by mouth every 8 (eight) hours as needed for nausea. 03/04/20   Larene Pickett, PA-C  spironolactone (ALDACTONE) 25 MG tablet Take 1.5 tablets (37.5 mg total) by mouth at bedtime. 02/24/21   Croitoru, Mihai, MD  WIXELA INHUB 500-50 MCG/ACT AEPB INHALE 1 PUFF BY MOUTH TWICE A DAY 01/28/21   Lilland, Alana, DO  furosemide (LASIX) 40 MG tablet TAKE 1 TAB 3 TIMES A DAY FOR WEIGHT >168LB 2 TIMES A DAY FOR WEIGHT 163-168LB ONCE DAILY FOR WEIGHT <163LB 12/24/18 07/20/19  Shirley, [place], DO                                                                                                                                    Past Surgical History Past Surgical History:  Procedure Laterality Date   INGUINAL HERNIA REPAIR Bilateral 03/30/2017   Procedure: OPEN BILATERAL INGUINAL HERNIA REPAIR WITH MESH;  Surgeon: Kinsinger, Arta Bruce, MD;  Location: [facility];  Service: General;  Laterality: Bilateral;  GENERAL COMBINED WITH REGIONAL FOR POST OP PAIN    INSERTION OF MESH N/A 10/16/2016   Procedure: INSERTION OF MESH;  Surgeon: Kieth Brightly Arta Bruce, MD;  Location: [place];  Service: General;  Laterality: N/A;   INSERTION OF MESH Bilateral 03/30/2017   Procedure: INSERTION OF MESH;  Surgeon: Kieth Brightly Arta Bruce, MD;  Location: [facility];  Service: General;  Laterality: Bilateral;  GENERAL COMBINED WITH REGIONAL FOR POST OP PAIN    IR PARACENTESIS  08/19/2016   IR PARACENTESIS  08/26/2016   IR PARACENTESIS  09/02/2016   IR PARACENTESIS  09/09/2016   IR PARACENTESIS  09/16/2016   IR PARACENTESIS  10/03/2016   IR PARACENTESIS  10/11/2016   IR  RADIOLOGIST EVAL & MGMT  09/29/2016   IR RADIOLOGIST EVAL & MGMT  12/21/2016   IR RADIOLOGIST EVAL & MGMT  01/23/2018   IR RADIOLOGIST EVAL & MGMT  02/19/2019   IR  RADIOLOGIST EVAL & MGMT  03/30/2020   IR RADIOLOGIST EVAL & MGMT  03/17/2021   IR TIPS  10/11/2016   RADIOLOGY WITH ANESTHESIA N/A 10/11/2016   Procedure: TIPS;  Surgeon: Arne Cleveland, MD;  Location: [place];  Service: Radiology;  Laterality: N/A;   UMBILICAL HERNIA REPAIR N/A 10/16/2016   Procedure: HERNIA REPAIR UMBILICAL ADULT;  Surgeon: Kinsinger, Arta Bruce, MD;  Location: [place];  Service: General;  Laterality: N/A;   Family History Family History  Problem Relation Age of Onset   Other Mother        died when pt was only 67 - unknown cause.   Other Father        deceased.   Asthma Sister    Heart disease Sister        multiple stents.   Asthma Brother    Hypertension Brother    Asthma Son    Asthma Brother    Hypertension Brother     Social History Social History   Tobacco Use   Smoking status: Never   Smokeless tobacco: Current    Types: Snuff, Chew   Tobacco comments:    Started chewing tobacco at 80 years old  Vaping Use   Vaping Use: Never used  Substance Use Topics   Alcohol use: No    Comment: quit drinking "over 2 yr ago" (10/11/2016)   Drug use: No   Allergies Patient has no known allergies.  Review of Systems Review of Systems All other systems are reviewed and are negative for acute change except as noted in the HPI  Physical Exam Vital Signs  I have reviewed the triage vital signs BP (!) 148/69 (BP Location: Right Arm)   Pulse (!) 114   Temp 99.2 F (37.3 C) (Oral)   Resp (!) 28   SpO2 100%   Physical Exam Vitals reviewed.  Constitutional:      General: He is not in acute distress.    Appearance: He is well-developed. He is not diaphoretic.  HENT:     Head: Normocephalic and atraumatic.     Nose: Nose normal.  Eyes:     General: No scleral icterus.       Right eye: No discharge.         Left eye: No discharge.     Conjunctiva/sclera: Conjunctivae normal.     Pupils: Pupils are equal, round, and reactive to light.  Cardiovascular:     Rate and Rhythm: Regular rhythm. Tachycardia present.     Heart sounds: No murmur heard.   No friction rub. No gallop.  Pulmonary:     Effort: Tachypnea, accessory muscle usage, prolonged expiration and respiratory distress present.     Breath sounds: No stridor. Wheezing and rhonchi present. No rales.  Abdominal:     General: There is no distension.     Palpations: Abdomen is soft.     Tenderness: There is no abdominal tenderness.  Musculoskeletal:        General: No tenderness.     Cervical back: Normal range of motion and neck supple.  Skin:    General: Skin is warm and dry.     Findings: No erythema or rash.  Neurological:     Mental Status: He is alert and oriented to person, place, and time.    ED Results and Treatments Labs (all labs ordered are listed, but only abnormal results are displayed) Labs Reviewed  RESP PANEL BY RT-PCR (FLU A&B, COVID) ARPGX2 - Abnormal; Notable for  the following components:      Result Value   Influenza A by PCR POSITIVE (*)    All other components within normal limits  COMPREHENSIVE METABOLIC PANEL - Abnormal; Notable for the following components:   CO2 20 (*)    BUN 24 (*)    Creatinine, Ser 1.75 (*)    Calcium 8.8 (*)    Albumin 2.9 (*)    AST 68 (*)    Total Bilirubin 1.8 (*)    GFR, Estimated 39 (*)    All other components within normal limits  CBC WITH DIFFERENTIAL/PLATELET - Abnormal; Notable for the following components:   RBC 3.39 (*)    Hemoglobin 11.6 (*)    HCT 34.1 (*)    MCV 100.6 (*)    MCH 34.2 (*)    Platelets 93 (*)    All other components within normal limits  BRAIN NATRIURETIC PEPTIDE - Abnormal; Notable for the following components:   B Natriuretic Peptide 565.5 (*)    All other components within normal limits  TROPONIN I (HIGH SENSITIVITY) - Abnormal;  Notable for the following components:   Troponin I (High Sensitivity) 21 (*)    All other components within normal limits  TROPONIN I (HIGH SENSITIVITY) - Abnormal; Notable for the following components:   Troponin I (High Sensitivity) 21 (*)    All other components within normal limits  BLOOD GAS, VENOUS                                                                                                                         EKG  EKG Interpretation  Date/Time:  Saturday April 17 2021 06:51:25 EST Ventricular Rate:  110 PR Interval:    QRS Duration: 86 QT Interval:  354 QTC Calculation: 462 R Axis:   43 Text Interpretation: Atrial flutter Premature atrial complexes Consider left ventricular hypertrophy ST depr, consider ischemia, inferior leads Confirmed by Addison Lank [phone]) on 04/17/2021 7:04:35 AM       Radiology DG Chest Port 1 View  Result Date: 04/17/2021 CLINICAL DATA:  80 year old male with shortness of breath for 1 hour. Wheezing. Cold symptoms for 3 days. EXAM: PORTABLE CHEST 1 VIEW COMPARISON:  Chest radiographs 10/04/2020 and earlier. FINDINGS: Portable AP upright view at 0401 hours. Stable lung volumes and mediastinal contours. Visualized tracheal air column is within normal limits. Left lung markings are stable. Acute on chronic interstitial opacity tracking from the right hilum to the right lung base. No consolidation or pleural effusion. No pneumothorax. Stable mild architectural distortion along the right middle fissure. No acute osseous abnormality identified. Negative visible bowel gas. IMPRESSION: Evidence of chronic lung disease with increased reticulonodular opacity at the right lung base compatible with acute bronchopneumonia. No pleural effusion. Followup PA and lateral chest X-ray is recommended in 3-4 weeks following trial of antibiotic therapy to ensure resolution and exclude underlying malignancy. Electronically Signed   By: Genevie Ann M.D.   On: 04/17/2021 04:20  Pertinent labs & imaging results that were available during my care of the patient were reviewed by me and considered in my medical decision making (see MDM for details).  Medications Ordered in ED Medications  albuterol (PROVENTIL) (2.5 MG/3ML) 0.083% nebulizer solution 10 mg (10 mg Nebulization New Bag/Given 04/17/21 0419)  acetaminophen (TYLENOL) tablet 500 mg (has no administration in time range)  oseltamivir (TAMIFLU) capsule 75 mg (has no administration in time range)  levalbuterol (XOPENEX) nebulizer solution 1.25 mg (has no administration in time range)  furosemide (LASIX) injection 20 mg (has no administration in time range)  ipratropium (ATROVENT) nebulizer solution 0.5 mg (0.5 mg Nebulization Given 04/17/21 0517)                                                                                                                                     Procedures .1-3 Lead EKG Interpretation Performed by: Fatima Blank, MD Authorized by: Fatima Blank, MD     Interpretation: abnormal     ECG rate:  112   ECG rate assessment: tachycardic     Rhythm: atrial flutter     Ectopy: none     Conduction: normal   .Critical Care Performed by: Fatima Blank, MD Authorized by: Fatima Blank, MD   Critical care provider statement:    Critical care time (minutes):  45   Critical care time was exclusive of:  Separately billable procedures and treating other patients   Critical care was necessary to treat or prevent imminent or life-threatening deterioration of the following conditions:  Respiratory failure   Critical care was time spent personally by me on the following activities:  Development of treatment plan with patient or surrogate, discussions with consultants, evaluation of patient's response to treatment, examination of patient, obtaining history from patient or surrogate, review of old charts, re-evaluation of patient's condition, pulse oximetry,  ordering and review of radiographic studies, ordering and review of laboratory studies and ordering and performing treatments and interventions   Care discussed with: admitting provider    (including critical care time)  Medical Decision Making / ED Course I have reviewed the nursing notes for this encounter and the patient's prior records (if available in EHR or on provided paperwork).  Christoph Copelan was evaluated in Emergency Department on 04/17/2021 for the symptoms described in the history of present illness. He was evaluated in the context of the global COVID-19 pandemic, which necessitated consideration that the patient might be at risk for infection with the SARS-CoV-2 virus that causes COVID-19. Institutional protocols and algorithms that pertain to the evaluation of patients at risk for COVID-19 are in a state of rapid change based on information released by regulatory bodies including the CDC and federal and state organizations. These policies and algorithms were followed during the patient's care in the ED.     Patient presents with respiratory distress. Most consistent with asthma exacerbation. No evidence  of volume overload on exam. Work-up initiated to rule out infectious process.  Pertinent labs & imaging results that were available during my care of the patient were reviewed by me and considered in my medical decision making:  Chest x-ray with possible right lower lobe bronchopneumonia. Influenza A positive. No leukocytosis on CBC. Renal function close to his baseline.Marland Kitchen BNP greater than 500 but close to his previous baseline.  Patient does not have evidence of volume overload on exam or pulmonary edema and so I do not feel patient needs emergent diuresing at this time  Patient was treated with continuous DuoNeb. Already received Solu-Medrol by EMS.  On reassessments patient's work of breathing improved but still tachypneic. Diffuse rales now. Will possible superimposed  CHF now due to tachycardia from breathing treatments. Will give low dose lasix.  Tamiflu given. Will admit to [facility] for further management.  Final Clinical Impression(s) / ED Diagnoses Final diagnoses:  SOB (shortness of breath)  Influenza A  Moderate persistent asthma with exacerbation     This chart was dictated using voice recognition software.  Despite best efforts to proofread,  errors can occur which can change the documentation meaning.      Fatima Blank, MD 04/17/21 [phone]

## 2021-04-17 NOTE — Plan of Care (Signed)
  Problem: Education: Goal: Knowledge of General Education information will improve Description Including pain rating scale, medication(s)/side effects and non-pharmacologic comfort measures Outcome: Progressing   Problem: Activity: Goal: Risk for activity intolerance will decrease Outcome: Progressing   Problem: Safety: Goal: Ability to remain free from injury will improve Outcome: Progressing   

## 2021-04-18 ENCOUNTER — Observation Stay (HOSPITAL_BASED_OUTPATIENT_CLINIC_OR_DEPARTMENT_OTHER): Payer: Medicare Other

## 2021-04-18 DIAGNOSIS — R0602 Shortness of breath: Secondary | ICD-10-CM | POA: Diagnosis not present

## 2021-04-18 LAB — COMPREHENSIVE METABOLIC PANEL
ALT: 43 U/L (ref 0–44)
AST: 64 U/L — ABNORMAL HIGH (ref 15–41)
Albumin: 2.9 g/dL — ABNORMAL LOW (ref 3.5–5.0)
Alkaline Phosphatase: 50 U/L (ref 38–126)
Anion gap: 8 (ref 5–15)
BUN: 36 mg/dL — ABNORMAL HIGH (ref 8–23)
CO2: 20 mmol/L — ABNORMAL LOW (ref 22–32)
Calcium: 9 mg/dL (ref 8.9–10.3)
Chloride: 110 mmol/L (ref 98–111)
Creatinine, Ser: 1.62 mg/dL — ABNORMAL HIGH (ref 0.61–1.24)
GFR, Estimated: 43 mL/min — ABNORMAL LOW (ref >60)
Glucose, Bld: 192 mg/dL — ABNORMAL HIGH (ref 70–99)
Potassium: 4.1 mmol/L (ref 3.5–5.1)
Sodium: 138 mmol/L (ref 135–145)
Total Bilirubin: 0.8 mg/dL (ref 0.3–1.2)
Total Protein: 7 g/dL (ref 6.5–8.1)

## 2021-04-18 LAB — CBC
HCT: 32.9 % — ABNORMAL LOW (ref 39.0–52.0)
Hemoglobin: 11.3 g/dL — ABNORMAL LOW (ref 13.0–17.0)
MCH: 33.2 pg (ref 26.0–34.0)
MCHC: 34.3 g/dL (ref 30.0–36.0)
MCV: 96.8 fL (ref 80.0–100.0)
Platelets: 106 10*3/uL — ABNORMAL LOW (ref 150–400)
RBC: 3.4 MIL/uL — ABNORMAL LOW (ref 4.22–5.81)
RDW: 12.5 % (ref 11.5–15.5)
WBC: 11.9 10*3/uL — ABNORMAL HIGH (ref 4.0–10.5)
nRBC: 0 % (ref 0.0–0.2)

## 2021-04-18 LAB — ECHOCARDIOGRAM COMPLETE
Height: 70 in
S' Lateral: 4.1 cm
Weight: 2402.13 oz

## 2021-04-18 LAB — PROTIME-INR
INR: 1.2 (ref 0.8–1.2)
Prothrombin Time: 15.6 seconds — ABNORMAL HIGH (ref 11.4–15.2)

## 2021-04-18 LAB — FOLATE: Folate: 13.3 ng/mL (ref >5.9)

## 2021-04-18 LAB — MAGNESIUM: Magnesium: 2.2 mg/dL (ref 1.7–2.4)

## 2021-04-18 LAB — VITAMIN B12: Vitamin B-12: 557 pg/mL (ref 180–914)

## 2021-04-18 MED ORDER — PREDNISONE 20 MG PO TABS
40.0000 mg | ORAL_TABLET | Freq: Every day | ORAL | Status: DC
  Administered 2021-04-18: 11:00:00 40 mg via ORAL
  Filled 2021-04-18: qty 2

## 2021-04-18 MED ORDER — OSELTAMIVIR PHOSPHATE 30 MG PO CAPS: 30.0000 mg | ORAL_CAPSULE | Freq: Two times a day (BID) | ORAL | 0 refills | Status: DC

## 2021-04-18 MED ORDER — ALLOPURINOL 100 MG PO TABS
50.0000 mg | ORAL_TABLET | Freq: Every day | ORAL | Status: DC
  Administered 2021-04-18: 11:00:00 50 mg via ORAL
  Filled 2021-04-18: qty 1

## 2021-04-18 MED ORDER — PREDNISONE 20 MG PO TABS: 40.0000 mg | ORAL_TABLET | Freq: Every day | ORAL | 0 refills | Status: DC

## 2021-04-18 NOTE — Progress Notes (Signed)
Family Medicine Teaching Service Daily Progress Note Intern Pager: [phone]  [name] Medical record number: [id number] Date of birth: [birth year] Age: 80 y.o. Gender: male  Primary Care Provider: [name], DO Consultants: None Code Status: DNR  Pt Overview and Major Events to Date:  12/03: Admitted to FPTS  Assessment and Plan: Roberto Romanoski is a 80 y.o. male presenting with Shortness of Breath. PMHx of asthma, HFpEF (09/2020), CKD 3B, liver cirrhosis status post TIPS (2018), history of SBP (2010), mild thrombocytopenia, remote history of EtOH abuse-7 years in remission, hypertension, macrocytic anemia, gout, chronic low back pain, arthritis   Sepsis likely secondary to Flu A pneumonia, acute asthma exacerbation Hemodynamically stable overnight and remains afebrile.  Currently on room air with O2 sats 100%. Leukocytosis likely secondary from high dose steroids given.  On exam lungs sounds rhonchorous throughout with prolonged expiratory wheezing.  Mild cough without sputum production. -Will initiate Prednisone 40 mg daily x 4 days -Continue home Dulera -Continue prn duoneb -Continue to monitor fevers -Continue to  monitior respirations and maintain O2 sats >92% -Continue Tamiflu 30 mg BID -BMP and CBC daily  HFpEF (09/2020)  Atrial flutter and bradycardia LVEF 55-60% with normal function on last ECHO. 24 hr UOP 825 cc plus unmeasured voids.   Does not appear volume overload on exam. -Follow up ECHO -Strict intake and output -Daily weights -Continue Aldactone 37.5 mg daily -Continue Bisoprolol 7.5 gm daily -Resume Farxiga when appropriate -BMP daily, replete electrolytes as needed   AKI  CKD 3B Improving.  Likely secondary to diureses -Continue to hold nephrotoxic agents -BMP daily   Elevated AST  Thrombocytopenia  Hx liver cirrhosis s/p TIPS 2018  hx of SBP (2010)  Remote hx of EtOH abuse, 32yrs in remission  Improving.  NO signs of bleeding.   Does not endorse any abdominal pain -Continue to monitor  HTN Normotensive. -Continue Norvasc 5 mg daily -Restart Losartan when Cr improved   Arthritis  gout Asymptomatic. -Restart home medications Allopurinol 50 mg daily   Chronic low back pain Stable Continue to monitor  BPH No complaints of difficulty starting urinating or weak stream.  Is currently on diuretics.  Reports no difficulty voiding.   -Will discontinue Flomax if any urinary retention can trial   FEN/GI: regular PPx: Lovenox Dispo:Home pending clinical improvement . Barriers include none.   Subjective:  No acute complaints overnight.  Reports breathing much better today.  Denies any fevers, chest pain, shortness of breath, or sputum production.  Voiding well.  Asking when can go home.    Objective: Temp:  [97.6 F (36.4 C)-98.9 F (37.2 C)] 97.6 F (36.4 C) (12/04 0348) Pulse Rate:  [65-120] 65 (12/04 0348) Resp:  [16-30] 20 (12/04 0348) BP: (117-174)/(55-92) 137/61 (12/04 0348) SpO2:  [95 %-100 %] 100 % (12/04 0348) Weight:  [69.7 kg] 69.7 kg (12/03 1548)  Physical Exam:  General: 80 y.o. male in NAD Cardio: RRR no m/r/g Lungs: Rhonchorous breath sounds with prolonged expiratory wheezing throughout lung fields. No IWOB on room air Abdomen: Soft, non-tender to palpation, non-distended, positive bowel sounds Skin: warm and dry Extremities: No edema  Laboratory: Recent Labs  Lab 04/17/21 0332 04/18/21 0259  WBC 8.5 11.9*  HGB 11.6* 11.3*  HCT 34.1* 32.9*  PLT 93* 106*   Recent Labs  Lab 04/17/21 0332 04/18/21 0259  NA 137 138  K 3.9 4.1  CL 109 110  CO2 20* 20*  BUN 24* 36*  CREATININE 1.75* 1.62*  CALCIUM  8.8* 9.0  PROT 6.8 7.0  BILITOT 1.8* 0.8  ALKPHOS 47 50  ALT 37 43  AST 68* 64*  GLUCOSE 95 192*      Imaging/Diagnostic Tests: No results found.   Carollee Leitz, MD 04/18/2021, 4:22 AM PGY-3, [place] Intern pager: [phone], text pages welcome

## 2021-04-18 NOTE — Discharge Instructions (Signed)
Thank you for allowing [place] to participate in your care!    You were admitted for shortness of breath likely due to flu A infection.  You are having shortness of breath and given breathing treatments as well as started on Tamiflu and steroids.  Physical therapy and Occupational Therapy both saw you and recommended home physical therapy and Occupational Therapy.  Someone should be calling you to set this up.  Regarding her treatment for the flu we are going to continue Tamiflu for 4 more days which she will take twice daily.  We are also going to continue prednisone for an additional 4 days, you will take 40 mg daily.  Prescriptions for both of these has been sent to your pharmacy.  If your shortness of breath worsens or you have any other issues please be reevaluated in the emergency department I hope you have a wonderful night!  If you experience worsening of your admission symptoms, develop shortness of breath, life threatening emergency, suicidal or homicidal thoughts you must seek medical attention immediately by calling 911 or calling your MD immediately  if symptoms less severe.

## 2021-04-18 NOTE — Care Management Obs Status (Signed)
[place] NOTIFICATION   Patient Details  Name: Bayley Hurn MRN: [id number] Date of Birth: Jun 02, 1940   Medicare Observation Status Notification Given:  Yes    Bartholomew Crews, RN 04/18/2021, 2:33 PM

## 2021-04-18 NOTE — TOC Transition Note (Signed)
Transition of Care [facility]) - CM/SW Discharge Note   Patient Details  Name: Donald Moreno MRN: [id number] Date of Birth: 03/09/1941  Transition of Care [facility]) CM/SW Contact:  Bartholomew Crews, RN Phone Number: [phone] 04/18/2021, 4:51 PM   Clinical Narrative:     Notified by MD of patient transitioning home today. HH PT/OT orders written. Referral to [facility]) accepted with delay in start of care greater than 48 hours. Spoke to patient on hospital room phone - patient agreeable to home health. Advised that agency will call later in the week to schedule visits. Patient stated that he will arrange for transportation home. Demographics and PCP verified. No further TOC needs identified.   Final next level of care: [place] Barriers to Discharge: No Barriers Identified   Patient Goals and CMS Choice Patient states their goals for this hospitalization and ongoing recovery are:: return home CMS Medicare.gov Compare Post Acute Care list provided to:: Patient Choice offered to / list presented to : Patient  Discharge Placement                       Discharge Plan and Services                DME Arranged: N/A DME Agency: NA       HH Arranged: PT, OT HH Agency: [place] Date [place]: 04/18/21 Time [place]: [place] Representative spoke with at [place]: [place] ([place]) Interventions     Readmission Risk Interventions No flowsheet data found.

## 2021-04-18 NOTE — Discharge Summary (Signed)
[facility] Discharge Summary  Patient name: Gurveer Colucci Medical record number: [id number] Date of birth: 1940-10-29 Age: 80 y.o. Gender: male Date of Admission: 04/17/2021  Date of Discharge: 04/18/2021 Admitting Physician: Merrily Brittle, DO  Primary Care Provider: [name], DO Consultants: None  Indication for Hospitalization: Sepsis secondary to Flu A  Discharge Diagnoses/Problem List:  Influenza A positive Acute Exacerbation Asthma  Disposition: Home  Discharge Condition: Stable  Discharge Exam:   Physical Exam:  General: 79 y.o. male in NAD Cardio: RRR no m/r/g Lungs: Rhonchorous breath sounds with prolonged expiratory wheezing throughout lung fields. No IWOB on room air Abdomen: Soft, non-tender to palpation, non-distended, positive bowel sounds Skin: warm and dry Extremities: No edema  Brief Hospital Course:  Leith Szafranski is a 80 y.o. male presenting with Shortness of Breath. Greycen Felter is a 80 y.o. male presenting with Shortness of Breath. PMHx of asthma, HFpEF (09/2020), CKD 3B, liver cirrhosis status post TIPS (2018), history of SBP (2010), mild thrombocytopenia, remote history of EtOH abuse-7 years in remission, hypertension, macrocytic anemia, gout, chronic low back pain, arthritis.   ED Course: Patient had cold-like sxs x3days but called EMS for SOB x1hr with wheezing. He was found to be flu +. EMS gave him 5mg  albuterol X2, 5mg  Atrovent without relief X2 and 125mg  solumedrol.  BNP was also elevated at 565.  Patient had crackles in right lower lung fields with diffuse wheezing. BP (!) 148/69 (BP Location: Right Arm)   Pulse (!) 114   Temp 99.2 F (37.3 C) (Oral)   Resp (!) 28   SpO2 100%  Gen:                Awake, increased work of breathing Resp:               Diffuse wheezes and rhonchi bilaterally.  Increased work of breathing   Brief Hospital Course: Patient presented to the emergency department via EMS having  shortness of breath for approximately 1 hour prior to arrival and flu symptoms for 3 days.  He was given Solu-Medrol, Atrovent, albuterol.  He was tachypneic and tachycardic.  He was placed on DuoNebs.  Chest x-ray showed acute bronchopneumonia with no pleural effusions.  Viral panel showed positive for flu.  BNP elevated at 565.  Patient's respiratory status greatly improved with these treatments with his O2 improving to 100% on room air with no respiratory distress.  Patient was started on Tamiflu as well as duo nebs were continued every 4 hours as needed.  Overnight patient's respiratory status continues to improve.  He started on prednisone as well.  He is ambulated with pulse ox showing 97% on room air with ambulation and 99% on room air at rest.  PT and OT evaluated the patient and recommended home PT/OT.  Patient was discharged home with strict return precautions.  Plan by Problem: Respiratory distress 2/2 flu, acute asthma exacerbation Patient presented in acute respiratory distress.  Was placed on O2 nasal cannula and given albuterol, Atrovent, Solu-Medrol.  On secondary evaluation there was noted a mild cough with sputum production as well as rhonchi and wheezing.  Prednisone 40 mg daily was initiated as well as Tamiflu 30 mg twice daily.  Throughout the day his respirations improved.  He was seen by occupational therapy recommended home OT.  He was seen by physical therapy who recommended home health PT.  He was ambulated with pulse ox and O2 saturation was 97% while ambulating on room air  and 99% at rest on room air.  Given his great improvement and oxygenation and desire to go home patient was discharged.  Orders were placed for home health PT and OT.  Elevated creatinine Patient's baseline creatinine appears to be between 1.4 and 1.9.  On arrival was 1.75.  Patient with CKD stage IIIb.  Diuresed patient overnight and creatinine improved to 1.62.  Euvolemic on exam prior to  discharge.  HFpEF Patient with history of HFpEF with LVEF of 55-60% (09/2020).  On arrival he appeared mildly volume overloaded.  He was given 1 dose of Lasix in the emergency department.  After admission he was continued on his home beta-blocker.  Echocardiogram showed stable HFpEF with EF 50-55%.  Patient was discharged home on home regimen of Aldactone and bisoprolol.  Can resume [place] as well.   Issues for Follow Up:  Monitor fluid status and assess need for diuretics Repeat BMP at kidney function Follow-up with cardiology Follow-up PA and lateral chest x-ray recommended 3/4 weeks following treatment to ensure resolution and exclude underlying malignancy, order will need to be placed at hospital follow-up  Significant Procedures:  None  Significant Labs and Imaging:  Recent Labs  Lab 04/17/21 0332 04/18/21 0259  WBC 8.5 11.9*  HGB 11.6* 11.3*  HCT 34.1* 32.9*  PLT 93* 106*   Recent Labs  Lab 04/17/21 0332 04/18/21 0259  NA 137 138  K 3.9 4.1  CL 109 110  CO2 20* 20*  GLUCOSE 95 192*  BUN 24* 36*  CREATININE 1.75* 1.62*  CALCIUM 8.8* 9.0  MG  --  2.2  ALKPHOS 47 50  AST 68* 64*  ALT 37 43  ALBUMIN 2.9* 2.9*    DG Chest Port 1 View  Result Date: 04/17/2021 CLINICAL DATA:  80 year old male with shortness of breath for 1 hour. Wheezing. Cold symptoms for 3 days. EXAM: PORTABLE CHEST 1 VIEW COMPARISON:  Chest radiographs 10/04/2020 and earlier. FINDINGS: Portable AP upright view at 0401 hours. Stable lung volumes and mediastinal contours. Visualized tracheal air column is within normal limits. Left lung markings are stable. Acute on chronic interstitial opacity tracking from the right hilum to the right lung base. No consolidation or pleural effusion. No pneumothorax. Stable mild architectural distortion along the right middle fissure. No acute osseous abnormality identified. Negative visible bowel gas. IMPRESSION: Evidence of chronic lung disease with increased  reticulonodular opacity at the right lung base compatible with acute bronchopneumonia. No pleural effusion. Followup PA and lateral chest X-ray is recommended in 3-4 weeks following trial of antibiotic therapy to ensure resolution and exclude underlying malignancy. Electronically Signed   By: Genevie Ann M.D.   On: 04/17/2021 04:20   ECHOCARDIOGRAM COMPLETE  Result Date: 04/18/2021    ECHOCARDIOGRAM REPORT   Patient Name:   DAIMIEN PATMON Date of Exam: 04/18/2021 Medical Rec #:  [id number]      Height:       70.0 in Accession #:    [id number]     Weight:       150.1 lb Date of Birth:  November 30, 1940       BSA:          1.848 m Patient Age:    43 years       BP:           137/61 mmHg Patient Gender: M              HR:           81 bpm.  Exam Location:  Inpatient Procedure: 2D Echo, Color Doppler and Cardiac Doppler Indications:    shortness of breath  History:        Patient has prior history of Echocardiogram examinations, most                 recent 04/18/2020. Arrythmias:Bradycardia and Atrial Flutter,                 Signs/Symptoms:Shortness of Breath; Risk Factors:Hypertension.  Sonographer:    Melissa Morford RDCS (AE, PE) Referring Phys: [id number] [place]  1. Left ventricular ejection fraction, by estimation, is 50 to 55%. The left ventricle has low normal function. The left ventricle has no regional wall motion abnormalities. The left ventricular internal cavity size was moderately dilated. Left ventricular diastolic parameters were normal.  2. Right ventricular systolic function is normal. The right ventricular size is normal.  3. Right atrial size was mildly dilated.  4. The mitral valve is normal in structure. Mild mitral valve regurgitation.  5. The aortic valve is normal in structure. Aortic valve regurgitation is not visualized.  6. The inferior vena cava is dilated in size with >50% respiratory variability, suggesting right atrial pressure of 8 mmHg. Comparison(s): The left ventricular  function No significant change. FINDINGS  Left Ventricle: Left ventricular ejection fraction, by estimation, is 50 to 55%. The left ventricle has low normal function. The left ventricle has no regional wall motion abnormalities. The left ventricular internal cavity size was moderately dilated. There is no left ventricular hypertrophy. Left ventricular diastolic parameters were normal. Right Ventricle: The right ventricular size is normal. Right vetricular wall thickness was not assessed. Right ventricular systolic function is normal. Left Atrium: Left atrial size was normal in size. Right Atrium: Right atrial size was mildly dilated. Pericardium: There is no evidence of pericardial effusion. Mitral Valve: The mitral valve is normal in structure. Mild mitral valve regurgitation. Tricuspid Valve: The tricuspid valve is normal in structure. Tricuspid valve regurgitation is mild. Aortic Valve: The aortic valve is normal in structure. Aortic valve regurgitation is not visualized. Pulmonic Valve: The pulmonic valve was grossly normal. Pulmonic valve regurgitation is not visualized. Aorta: The aortic root and ascending aorta are structurally normal, with no evidence of dilitation. Venous: The inferior vena cava is dilated in size with greater than 50% respiratory variability, suggesting right atrial pressure of 8 mmHg. IAS/Shunts: No atrial level shunt detected by color flow Doppler.  LEFT VENTRICLE PLAX 2D LVIDd:         5.40 cm LVIDs:         4.10 cm LV PW:         10.00 cm LV IVS:        1.10 cm                         3D Volume EF:                         3D EF:        53 % RIGHT VENTRICLE RV S prime:     11.20 cm/s TAPSE (M-mode): 1.5 cm LEFT ATRIUM           Index        RIGHT ATRIUM           Index LA diam:      4.00 cm 2.16 cm/m   RA Area:     21.80 cm  LA Vol (A2C): 52.1 ml 28.20 ml/m  RA Volume:   67.60 ml  36.58 ml/m  AORTIC VALVE LVOT Vmax:   122.00 cm/s LVOT Vmean:  79.500 cm/s LVOT VTI:    0.234 m  AORTA  Ao Root diam: 3.30 cm Ao Asc diam:  3.30 cm TRICUSPID VALVE TR Peak grad:   31.8 mmHg TR Vmax:        282.00 cm/s  SHUNTS Systemic VTI: 0.23 m Dorris Carnes MD Electronically signed by Dorris Carnes MD Signature Date/Time: 04/18/2021/1:09:48 PM    Final      Results/Tests Pending at Time of Discharge: None  Discharge Medications:  Allergies as of 04/18/2021   No Known Allergies      Medication List     STOP taking these medications    ondansetron 4 MG disintegrating tablet Commonly known as: Zofran ODT       TAKE these medications    albuterol 108 (90 Base) MCG/ACT inhaler Commonly known as: ProAir HFA Inhale 2 puffs into the lungs every 4 (four) hours as needed for wheezing.   allopurinol 100 MG tablet Commonly known as: ZYLOPRIM TAKE 1/2 TABLET BY MOUTH EVERY DAY   amLODipine 5 MG tablet Commonly known as: NORVASC Take 1 tablet (5 mg total) by mouth daily.   bisoprolol 5 MG tablet Commonly known as: ZEBETA Take 1&1/2 tablets ( 7.5 mg ) daily What changed:  how much to take how to take this when to take this   dapagliflozin propanediol 10 MG Tabs tablet Commonly known as: FARXIGA Take 1 tablet (10 mg total) by mouth daily.   Ensure Complete Shake Liqd Take 1 Container by mouth daily. What changed: when to take this   ferrous sulfate 325 (65 FE) MG tablet TAKE 1 TABLET BY MOUTH EVERY OTHER DAY   lactulose 10 GM/15ML solution Commonly known as: CHRONULAC Take 15 mLs (10 g total) by mouth 2 (two) times daily as needed for mild constipation. (NEED TO ENSURE 2 SOFT BM IN A DAY.).   Lidocaine 4 % Ptch Commonly known as: HM Lidocaine Patch Apply 1 Units topically daily as needed.   losartan 50 MG tablet Commonly known as: COZAAR Take 1 tablet (50 mg total) by mouth at bedtime.   montelukast 10 MG tablet Commonly known as: SINGULAIR TAKE 1 TABLET BY MOUTH EVERYDAY AT BEDTIME What changed: See the new instructions.   oseltamivir 30 MG capsule Commonly known  as: TAMIFLU Take 1 capsule (30 mg total) by mouth 2 (two) times daily.   predniSONE 20 MG tablet Commonly known as: DELTASONE Take 2 tablets (40 mg total) by mouth daily with breakfast. Start taking on: April 19, 2021   spironolactone 25 MG tablet Commonly known as: ALDACTONE Take 1.5 tablets (37.5 mg total) by mouth at bedtime.   Vitamin D-3 125 MCG (5000 UT) Tabs Take 5,000 Units daily by mouth.   Wixela Inhub 500-50 MCG/ACT Aepb Generic drug: fluticasone-salmeterol INHALE 1 PUFF BY MOUTH TWICE A DAY What changed: See the new instructions.        Discharge Instructions: Please refer to Patient Instructions section of EMR for full details.  Patient was counseled important signs and symptoms that should prompt return to medical care, changes in medications, dietary instructions, activity restrictions, and follow up appointments.   Follow-Up Appointments:  Follow-up Information     [place], [place] Follow up.   Specialty: Home Health Services Why: the office will call the end of the week to schedule home  health visits Contact information: [place] [address] [phone]                 Gifford Shave, MD 04/18/2021, 5:05 PM PGY-3, [place]

## 2021-04-18 NOTE — Progress Notes (Signed)
Physical Therapy Note  PT eval complete with full note to follow;  Recommend going home with HHPT for transition out of hospital;  Overall walking well and showing good self-monitor for activity tolerance;  Will follow acutely,   Roney Marion, [place] Pager [phone] Office [phone]

## 2021-04-18 NOTE — Evaluation (Signed)
Occupational Therapy Evaluation Patient Details Name: Donald Moreno MRN: [id number] DOB: 07/18/1940 Today's Date: 04/18/2021   History of Present Illness Pt is a 80 y.o. male presenting to ED on 12/2 with flu-like symptoms x3 days. PMH includes CHF, ashtma, CKD, HTN, ETOH abuse, gout, and liver cirrhosis   Clinical Impression   Pt lives at home with family, independent with ADLs and uses SPC/RW interchangeably for mobility at baseline. Pt min guard - min A for ADLs, increased difficulty with fine motor coordination components of ADL tasks due to tremor-like movements. Pt supervision for bed mobility, min guard A for transfers during session with RW. SpO2 above 92% on RA throughout session. Educated pt on using shower chair/BSC in shower at d/c for safety, pt verbalized understanding. Pt presenting with decreased balance, activity tolerance, ROM and coordination at this time. Will continue to follow acutely. Recommend d/c home with assistance and HHOT.     Recommendations for follow up therapy are one component of a multi-disciplinary discharge planning process, led by the attending physician.  Recommendations may be updated based on patient status, additional functional criteria and insurance authorization.   Follow Up Recommendations  Home health OT    Assistance Recommended at Discharge Intermittent Supervision/Assistance  Functional Status Assessment  Patient has had a recent decline in their functional status and demonstrates the ability to make significant improvements in function in a reasonable and predictable amount of time.  Equipment Recommendations  Other (comment);None recommended by OT (pt has all recommended DME)    Recommendations for Other Services PT consult     Precautions / Restrictions Precautions Precautions: Fall Restrictions Weight Bearing Restrictions: No      Mobility Bed Mobility Overal bed mobility: Needs Assistance Bed Mobility: Supine to Sit      Supine to sit: Supervision          Transfers Overall transfer level: Needs assistance Equipment used: Rolling walker (2 wheels) Transfers: Sit to/from Stand Sit to Stand: Min guard                  Balance Overall balance assessment: Mild deficits observed, not formally tested                                         ADL either performed or assessed with clinical judgement   ADL Overall ADL's : Needs assistance/impaired Eating/Feeding: Sitting;Minimal assistance Eating/Feeding Details (indicate cue type and reason): min A to open wrappers/lids due to UB tremor-like movements Grooming: Minimal assistance Grooming Details (indicate cue type and reason): min A for fine motor tasks due to UB tremor-like movements Upper Body Bathing: Set up;Sitting   Lower Body Bathing: Minimal assistance;Sitting/lateral leans   Upper Body Dressing : Minimal assistance;Sitting   Lower Body Dressing: Minimal assistance;Sitting/lateral leans Lower Body Dressing Details (indicate cue type and reason): donned socks sitting EOB Toilet Transfer: Min guard;Ambulation   Toileting- Clothing Manipulation and Hygiene: Minimal assistance;Sit to/from stand Toileting - Clothing Manipulation Details (indicate cue type and reason): min A to manage clothing while sitting on commode, unable to release hands from RW due to instability     Functional mobility during ADLs: Min guard General ADL Comments: tremor-like movements inhibiting pt from performing fine motor components of ADL tasks     Vision   Vision Assessment?: No apparent visual deficits     Perception     Praxis  Pertinent Vitals/Pain       Hand Dominance     Extremity/Trunk Assessment Upper Extremity Assessment Upper Extremity Assessment: Overall WFL for tasks assessed   Lower Extremity Assessment Lower Extremity Assessment: Defer to PT evaluation   Cervical / Trunk Assessment Cervical / Trunk  Assessment: Normal   Communication Communication Communication: No difficulties   Cognition Arousal/Alertness: Awake/alert Behavior During Therapy: WFL for tasks assessed/performed Overall Cognitive Status: Within Functional Limits for tasks assessed                                       General Comments  VSS throughout, SpO2 above 92% throughout session on RA    Exercises     Shoulder Instructions      Home Living Family/patient expects to be discharged to:: Private residence Living Arrangements: Other relatives;Other (Comment) (lives with neice and son) Available Help at Discharge: Family;Available 24 hours/day Type of Home: House Home Access: Stairs to enter [employer] of Steps: 1 step   Home Layout: One level     Bathroom Shower/Tub: Teacher, early years/pre: Standard Bathroom Accessibility: Yes How Accessible: Accessible via walker Home Equipment: [place] (2 wheels);Cane - single point;Grab bars - toilet;Grab bars - tub/shower          Prior Functioning/Environment Prior Level of Function : Independent/Modified Independent             Mobility Comments: uses RW and cane intermittently, reports he has electic w/c that he does not use ADLs 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 Problem List: Decreased strength;Decreased range of motion;Decreased activity tolerance;Impaired balance (sitting and/or standing);Impaired UE functional use;Decreased safety awareness      OT Treatment/Interventions: Self-care/ADL training;Energy conservation;DME and/or AE instruction;Therapeutic activities;Balance training;Patient/family education    OT Goals(Current goals can be found in the care plan section) Acute Rehab OT Goals Patient Stated Goal: return home OT Goal Formulation: With patient Time For Goal Achievement: 05/02/21 Potential to Achieve Goals: Good ADL Goals Pt Will Perform Grooming: with  supervision;standing Pt Will Perform Lower Body Bathing: with supervision;sit to/from stand Pt Will Perform Toileting - Clothing Manipulation and hygiene: with supervision;sit to/from stand  OT Frequency: Min 2X/week   Barriers to D/C:            Co-evaluation              AM-PAC OT "6 Clicks" Daily Activity     Outcome Measure Help from another person eating meals?: A Little Help from another person taking care of personal grooming?: A Little Help from another person toileting, which includes using toliet, bedpan, or urinal?: A Little Help from another person bathing (including washing, rinsing, drying)?: A Little Help from another person to put on and taking off regular upper body clothing?: A Little Help from another person to put on and taking off regular lower body clothing?: A Lot 6 Click Score: 17   End of Session Equipment Utilized During Treatment: Gait belt;Rolling walker (2 wheels) Nurse Communication: Mobility status  Activity Tolerance: Patient tolerated treatment well Patient left: in chair;with call bell/phone within reach;with chair alarm set  OT Visit Diagnosis: Unsteadiness on feet (R26.81);Other abnormalities of gait and mobility (R26.89);Muscle weakness (generalized) (M62.81)                Time: 0932-3557 OT Time Calculation (min): 37 min Charges:  OT  General Charges $OT Visit: 1 Visit OT Evaluation $OT Eval Low Complexity: 1 Low OT Treatments $Self Care/Home Management : 8-22 mins  Lynnda Child, OTD, OTR/L Acute Rehab [phone] - [place] 04/18/2021, 8:33 AM

## 2021-04-18 NOTE — Evaluation (Signed)
Physical Therapy Evaluation Patient Details Name: Donald Moreno MRN: [id number] DOB: 14-Nov-1940 Today's Date: 04/18/2021  History of Present Illness  Pt is a 80 y.o. male presenting to ED on 12/2 with flu-like symptoms x3 days. PMH includes CHF, ashtma, CKD, HTN, ETOH abuse, gout, and liver cirrhosis  Clinical Impression   Pt admitted with above diagnosis. Comes from home where he lives with family; Independent/modified indepepndent at baseline, uses an assistive device prn; Presents to PT overall doing well, but with decr endurance compared to baseline; I anticipate good progress;  Pt currently with functional limitations due to the deficits listed below (see PT Problem List). Pt will benefit from skilled PT to increase their independence and safety with mobility to allow discharge to the venue listed below.          Recommendations for follow up therapy are one component of a multi-disciplinary discharge planning process, led by the attending physician.  Recommendations may be updated based on patient status, additional functional criteria and insurance authorization.  Follow Up Recommendations Home health PT    Assistance Recommended at Discharge Set up Supervision/Assistance  Functional Status Assessment Patient has had a recent decline in their functional status and demonstrates the ability to make significant improvements in function in a reasonable and predictable amount of time.  Equipment Recommendations  Rolling walker (2 wheels);BSC/3in1    Recommendations for Other Services       Precautions / Restrictions Precautions Precautions: Fall Precaution Comments: fall risk is present, but minimized withuse of RW      Mobility  Bed Mobility Overal bed mobility: Needs Assistance Bed Mobility: Supine to Sit     Supine to sit: Supervision     General bed mobility comments: Little difficulty    Transfers Overall transfer level: Needs assistance Equipment used: Rolling  walker (2 wheels) Transfers: Sit to/from Stand Sit to Stand: Min guard (without physical contact)           General transfer comment: Slow, but steady rise    Ambulation/Gait Ambulation/Gait assistance: Supervision;Modified independent (Device/Increase time) Gait Distance (Feet): 450 Feet Assistive device: Rolling walker (2 wheels) Gait Pattern/deviations: Step-through pattern          Stairs            Wheelchair Mobility    Modified Rankin (Stroke Patients Only)       Balance Overall balance assessment: Mild deficits observed, not formally tested                                           Pertinent Vitals/Pain Pain Assessment: No/denies pain    Home Living Family/patient expects to be discharged to:: Private residence Living Arrangements: Other relatives;Other (Comment) (lives with neice and son) Available Help at Discharge: Family;Available 24 hours/day Type of Home: House Home Access: Stairs to enter   [employer] of Steps: 1 step   Home Layout: One level Home Equipment: Conservation officer, nature (2 wheels);Cane - single point;Grab bars - toilet;Grab bars - tub/shower      Prior Function Prior Level of Function : Independent/Modified Independent             Mobility Comments: uses RW and cane intermittently, reports he has electic w/c that he does not use ADLs Comments: worked in an Loss adjuster, chartered        Extremity/Trunk Assessment   Upper Extremity  Assessment Upper Extremity Assessment: Defer to OT evaluation    Lower Extremity Assessment Lower Extremity Assessment: Generalized weakness    Cervical / Trunk Assessment Cervical / Trunk Assessment: Normal  Communication   Communication: No difficulties  Cognition Arousal/Alertness: Awake/alert Behavior During Therapy: WFL for tasks assessed/performed Overall Cognitive Status: Within Functional Limits for tasks assessed                                           General Comments General comments (skin integrity, edema, etc.): Cues to self-monitor for activity tolerance    Exercises     Assessment/Plan    PT Assessment Patient needs continued PT services  PT Problem List Decreased strength;Decreased activity tolerance       PT Treatment Interventions DME instruction;Gait training;Stair training;Functional mobility training;Therapeutic activities;Therapeutic exercise;Balance training;Patient/family education    PT Goals (Current goals can be found in the Care Plan section)  Acute Rehab PT Goals Patient Stated Goal: Hopes to be able to get home PT Goal Formulation: With patient Time For Goal Achievement: 05/02/21 Potential to Achieve Goals: Good    Frequency Min 3X/week   Barriers to discharge        Co-evaluation               AM-PAC PT "6 Clicks" Mobility  Outcome Measure Help needed turning from your back to your side while in a flat bed without using bedrails?: None Help needed moving from lying on your back to sitting on the side of a flat bed without using bedrails?: None Help needed moving to and from a bed to a chair (including a wheelchair)?: A Little Help needed standing up from a chair using your arms (e.g., wheelchair or bedside chair)?: A Little Help needed to walk in hospital room?: A Little Help needed climbing 3-5 steps with a railing? : A Lot 6 Click Score: 19    End of Session   Activity Tolerance: Patient tolerated treatment well Patient left: in chair;with call bell/phone within reach;with chair alarm set Nurse Communication: Mobility status PT Visit Diagnosis: Unsteadiness on feet (R26.81)    Time: 0272-5366 PT Time Calculation (min) (ACUTE ONLY): 21 min   Charges:   PT Evaluation $PT Eval Low Complexity: [place], PT  Acute Rehabilitation Services Pager [phone] Office [place] 04/18/2021, 6:51 PM

## 2021-04-18 NOTE — Progress Notes (Signed)
FPTS Interim Night Progress Note  S:Patient sleeping comfortably.  Rounded with primary night RN.  No concerns voiced.  No orders required.    O: Today's Vitals   04/17/21 2031 04/17/21 2100 04/18/21 0011 04/18/21 0348  BP: (!) 137/55  (!) 145/71 137/61  Pulse: 82  81 65  Resp: 20  16 20   Temp: 97.9 F (36.6 C)  98.3 F (36.8 C) 97.6 F (36.4 C)  TempSrc: Oral  Oral   SpO2: 99%  100% 100%  Weight:      Height:      PainSc:  0-No pain        A/P: Continue current management  Carollee Leitz MD PGY-3, [facility] pager [phone]

## 2021-04-18 NOTE — Progress Notes (Signed)
SATURATION QUALIFICATIONS: (This note is used to comply with regulatory documentation for home oxygen)  Patient Saturations on Room Air at Rest = 99%  Patient Saturations on Room Air while Ambulating = 97%

## 2021-04-19 ENCOUNTER — Other Ambulatory Visit: Payer: Self-pay | Admitting: Gastroenterology

## 2021-04-19 DIAGNOSIS — K7460 Unspecified cirrhosis of liver: Secondary | ICD-10-CM

## 2021-04-19 NOTE — Telephone Encounter (Signed)
Follow Up"       Patient is returning Lisa's call from Friday(04-16-21).

## 2021-04-20 NOTE — Telephone Encounter (Signed)
Left a message for the patient to call back.  

## 2021-04-21 NOTE — Telephone Encounter (Signed)
Follow Up:      Patient returning Lisa's call from yesterday.(04-20-21)

## 2021-04-21 NOTE — Telephone Encounter (Signed)
Spoke with patient. He voiced understanding to take losartan 100 mg each night.

## 2021-04-22 ENCOUNTER — Ambulatory Visit: Payer: Medicare Other | Admitting: Family Medicine

## 2021-04-22 MED ORDER — LOSARTAN POTASSIUM 100 MG PO TABS: 100.0000 mg | ORAL_TABLET | Freq: Every day | ORAL | 5 refills | Status: DC

## 2021-04-22 NOTE — Addendum Note (Signed)
Addended by: Ricci Barker on: 04/22/2021 12:37 PM   Modules accepted: Orders

## 2021-04-26 ENCOUNTER — Telehealth: Payer: Self-pay

## 2021-04-26 NOTE — Telephone Encounter (Signed)
Lia from [employer] calling for PT verbal orders as follows:  1 time(s) weekly for 1 week(s), then 2 time(s) weekly for 3 week(s), then 1 time weekly for 3 weeks.   Verbal orders given per [facility] protocol  [name], RN

## 2021-05-04 ENCOUNTER — Ambulatory Visit
Admission: RE | Admit: 2021-05-04 | Discharge: 2021-05-04 | Disposition: A | Discharge status: Home / Self Care | Payer: Medicare Other | Source: Ambulatory Visit | Attending: Gastroenterology | Admitting: Gastroenterology

## 2021-05-04 DIAGNOSIS — K7460 Unspecified cirrhosis of liver: Secondary | ICD-10-CM

## 2021-05-19 DIAGNOSIS — N1832 Chronic kidney disease, stage 3b: Secondary | ICD-10-CM | POA: Diagnosis not present

## 2021-05-19 DIAGNOSIS — J45901 Unspecified asthma with (acute) exacerbation: Secondary | ICD-10-CM | POA: Diagnosis not present

## 2021-05-19 DIAGNOSIS — I130 Hypertensive heart and chronic kidney disease with heart failure and stage 1 through stage 4 chronic kidney disease, or unspecified chronic kidney disease: Secondary | ICD-10-CM | POA: Diagnosis not present

## 2021-05-19 DIAGNOSIS — I5032 Chronic diastolic (congestive) heart failure: Secondary | ICD-10-CM | POA: Diagnosis not present

## 2021-05-28 DIAGNOSIS — J45901 Unspecified asthma with (acute) exacerbation: Secondary | ICD-10-CM | POA: Diagnosis not present

## 2021-05-28 DIAGNOSIS — I5032 Chronic diastolic (congestive) heart failure: Secondary | ICD-10-CM | POA: Diagnosis not present

## 2021-05-28 DIAGNOSIS — I130 Hypertensive heart and chronic kidney disease with heart failure and stage 1 through stage 4 chronic kidney disease, or unspecified chronic kidney disease: Secondary | ICD-10-CM | POA: Diagnosis not present

## 2021-05-28 DIAGNOSIS — N1832 Chronic kidney disease, stage 3b: Secondary | ICD-10-CM | POA: Diagnosis not present

## 2021-06-02 DIAGNOSIS — N1832 Chronic kidney disease, stage 3b: Secondary | ICD-10-CM | POA: Diagnosis not present

## 2021-06-02 DIAGNOSIS — I5032 Chronic diastolic (congestive) heart failure: Secondary | ICD-10-CM | POA: Diagnosis not present

## 2021-06-02 DIAGNOSIS — J45901 Unspecified asthma with (acute) exacerbation: Secondary | ICD-10-CM | POA: Diagnosis not present

## 2021-06-02 DIAGNOSIS — I130 Hypertensive heart and chronic kidney disease with heart failure and stage 1 through stage 4 chronic kidney disease, or unspecified chronic kidney disease: Secondary | ICD-10-CM | POA: Diagnosis not present

## 2021-06-09 ENCOUNTER — Ambulatory Visit (INDEPENDENT_AMBULATORY_CARE_PROVIDER_SITE_OTHER): Payer: Medicare Other | Admitting: Family Medicine

## 2021-06-09 ENCOUNTER — Ambulatory Visit
Admission: RE | Admit: 2021-06-09 | Discharge: 2021-06-09 | Disposition: A | Discharge status: Home / Self Care | Payer: Medicare Other | Source: Ambulatory Visit | Attending: Family Medicine | Admitting: Family Medicine

## 2021-06-09 ENCOUNTER — Encounter: Payer: Self-pay | Admitting: Family Medicine

## 2021-06-09 ENCOUNTER — Other Ambulatory Visit: Payer: Self-pay

## 2021-06-09 ENCOUNTER — Ambulatory Visit (INDEPENDENT_AMBULATORY_CARE_PROVIDER_SITE_OTHER): Payer: Medicare Other

## 2021-06-09 VITALS — BP 130/59 | HR 63 | Ht 70.0 in | Wt 150.8 lb

## 2021-06-09 DIAGNOSIS — R918 Other nonspecific abnormal finding of lung field: Secondary | ICD-10-CM | POA: Diagnosis not present

## 2021-06-09 DIAGNOSIS — I5032 Chronic diastolic (congestive) heart failure: Secondary | ICD-10-CM | POA: Diagnosis not present

## 2021-06-09 DIAGNOSIS — I10 Essential (primary) hypertension: Secondary | ICD-10-CM | POA: Diagnosis not present

## 2021-06-09 DIAGNOSIS — Z23 Encounter for immunization: Secondary | ICD-10-CM | POA: Diagnosis not present

## 2021-06-09 DIAGNOSIS — Z8701 Personal history of pneumonia (recurrent): Secondary | ICD-10-CM

## 2021-06-09 DIAGNOSIS — D539 Nutritional anemia, unspecified: Secondary | ICD-10-CM | POA: Diagnosis not present

## 2021-06-09 DIAGNOSIS — N1832 Chronic kidney disease, stage 3b: Secondary | ICD-10-CM | POA: Diagnosis not present

## 2021-06-09 NOTE — Patient Instructions (Signed)
I am going to get labs today to check where you are at after your hospitalization last month. I have also ordered a chest X-ray to make sure that everything is resolved and there is nothing to be concerned about at this time.   Your blood pressure looks good, we will continue with your current medications.

## 2021-06-09 NOTE — Assessment & Plan Note (Signed)
Recheck CBC today. 

## 2021-06-09 NOTE — Assessment & Plan Note (Signed)
BP today 130/59.  Stable on losartan 100 mg, amlodipine 5 mg, bisoprolol 7.5. - Continue current regimen - Check BMP today

## 2021-06-09 NOTE — Progress Notes (Signed)
° ° °  SUBJECTIVE:   CHIEF COMPLAINT / HPI:   Patient presents for a follow-up on blood pressure as well as hospitalization last month.  Patient is that he has been feeling really well since his last hospitalization, there have been multiple medication adjustments that he feels he has been doing better with.  His current blood pressure medications include losartan 100mg , Amlodipine 5mg , bisoprolol 7.5mg     PERTINENT  PMH / PSH: Reviewed  OBJECTIVE:   BP (!) 130/59    Pulse 63    Ht 5\' 10"  (1.778 m)    Wt 150 lb 12.8 oz (68.4 kg)    SpO2 100%    BMI 21.64 kg/m   Gen: well-appearing, NAD CV: RRR, no m/r/g appreciated, no peripheral edema Pulm: CTAB, no wheezes/cracklesGI: soft, non-tender, non-distended  ASSESSMENT/PLAN:   HYPERTENSION, BENIGN SYSTEMIC BP today 130/59.  Stable on losartan 100 mg, amlodipine 5 mg, bisoprolol 7.5. - Continue current regimen - Check BMP today  Macrocytic anemia - Recheck CBC today   Recent hospitalization with PNA   HFpEF Per discharge summary, repeat CXR was recommended to further evaluate for resolution of hospital symptoms as well as ensuring no underlying malignancy. Appears stable from a volume status standpoint. - CXR ordered   [name], [place]

## 2021-06-10 ENCOUNTER — Telehealth: Payer: Self-pay | Admitting: Family Medicine

## 2021-06-10 DIAGNOSIS — R918 Other nonspecific abnormal finding of lung field: Secondary | ICD-10-CM

## 2021-06-10 LAB — COMPREHENSIVE METABOLIC PANEL
ALT: 20 IU/L (ref 0–44)
AST: 16 IU/L (ref 0–40)
Albumin/Globulin Ratio: 0.9 — ABNORMAL LOW (ref 1.2–2.2)
Albumin: 3.5 g/dL — ABNORMAL LOW (ref 3.7–4.7)
Alkaline Phosphatase: 122 IU/L — ABNORMAL HIGH (ref 44–121)
BUN/Creatinine Ratio: 16 (ref 10–24)
BUN: 30 mg/dL — ABNORMAL HIGH (ref 8–27)
Bilirubin Total: 1.4 mg/dL — ABNORMAL HIGH (ref 0.0–1.2)
CO2: 15 mmol/L — ABNORMAL LOW (ref 20–29)
Calcium: 9.1 mg/dL (ref 8.6–10.2)
Chloride: 110 mmol/L — ABNORMAL HIGH (ref 96–106)
Creatinine, Ser: 1.82 mg/dL — ABNORMAL HIGH (ref 0.76–1.27)
Globulin, Total: 3.7 g/dL (ref 1.5–4.5)
Glucose: 92 mg/dL (ref 70–99)
Potassium: 5.1 mmol/L (ref 3.5–5.2)
Sodium: 140 mmol/L (ref 134–144)
Total Protein: 7.2 g/dL (ref 6.0–8.5)
eGFR: 38 mL/min/{1.73_m2} — ABNORMAL LOW (ref >59)

## 2021-06-10 LAB — CBC
Hematocrit: 32.1 % — ABNORMAL LOW (ref 37.5–51.0)
Hemoglobin: 10.9 g/dL — ABNORMAL LOW (ref 13.0–17.7)
MCH: 33.6 pg — ABNORMAL HIGH (ref 26.6–33.0)
MCHC: 34 g/dL (ref 31.5–35.7)
MCV: 99 fL — ABNORMAL HIGH (ref 79–97)
Platelets: 167 10*3/uL (ref 150–450)
RBC: 3.24 x10E6/uL — ABNORMAL LOW (ref 4.14–5.80)
RDW: 12.5 % (ref 11.6–15.4)
WBC: 13.5 10*3/uL — ABNORMAL HIGH (ref 3.4–10.8)

## 2021-06-10 NOTE — Telephone Encounter (Signed)
Called patient with recent results. CXR recommended follow-up with CT chest without contrast due to persistent reticulonodular opacity in the right lung base. Patient is aware and is okay with getting further imaging.   Sachit Gilman, DO

## 2021-06-11 ENCOUNTER — Telehealth: Payer: Self-pay

## 2021-06-11 NOTE — Telephone Encounter (Signed)
Patient LVM on nurse line asking if he left his medications here on 1/25.  I do not see any notes about this. Will froward to PCP/CMA to see if she recalls seeing medication.

## 2021-07-01 ENCOUNTER — Ambulatory Visit
Admission: RE | Admit: 2021-07-01 | Discharge: 2021-07-01 | Disposition: A | Discharge status: Home / Self Care | Payer: Medicare Other | Source: Ambulatory Visit | Attending: Family Medicine | Admitting: Family Medicine

## 2021-07-01 ENCOUNTER — Other Ambulatory Visit: Payer: Self-pay

## 2021-07-01 DIAGNOSIS — R911 Solitary pulmonary nodule: Secondary | ICD-10-CM | POA: Diagnosis not present

## 2021-07-01 DIAGNOSIS — R918 Other nonspecific abnormal finding of lung field: Secondary | ICD-10-CM

## 2021-07-01 DIAGNOSIS — R599 Enlarged lymph nodes, unspecified: Secondary | ICD-10-CM | POA: Diagnosis not present

## 2021-07-06 ENCOUNTER — Telehealth: Payer: Self-pay | Admitting: Family Medicine

## 2021-07-06 NOTE — Telephone Encounter (Signed)
Attempted to call patient, left voicemail to call the office back regarding CT chest results.    Talita Recht, DO

## 2021-07-06 NOTE — Telephone Encounter (Signed)
Patient calls nurse line in regards to imaging results.   Patient reports you can leave a detailed VM.

## 2021-07-07 ENCOUNTER — Telehealth: Payer: Self-pay | Admitting: Family Medicine

## 2021-07-07 NOTE — Telephone Encounter (Signed)
Called patient and left detailed voicemail per request. CT scan does show signs of possible multifocal pneumonia, but could not ask the patient if he was having any symptoms.  Further discussed the results and we will consider repeat imaging in 12 months per the recommendation.   Also a concern for possible pulmonary hypertension and we may need to consider pulmonology referral    Ariani Seier, DO

## 2021-07-18 ENCOUNTER — Other Ambulatory Visit: Payer: Self-pay | Admitting: Family Medicine

## 2021-07-21 ENCOUNTER — Encounter: Payer: Self-pay | Admitting: Podiatry

## 2021-07-21 ENCOUNTER — Ambulatory Visit (INDEPENDENT_AMBULATORY_CARE_PROVIDER_SITE_OTHER): Payer: Medicare Other | Admitting: Podiatry

## 2021-07-21 ENCOUNTER — Other Ambulatory Visit: Payer: Self-pay

## 2021-07-21 DIAGNOSIS — B351 Tinea unguium: Secondary | ICD-10-CM

## 2021-07-21 DIAGNOSIS — N183 Chronic kidney disease, stage 3 unspecified: Secondary | ICD-10-CM

## 2021-07-21 NOTE — Progress Notes (Signed)
This patient returns to my office for at risk foot care.  This patient requires this care by a professional since this patient will be at risk due to having CKD.  This patient is unable to cut nails himself since the patient cannot reach his nails.These nails are painful walking and wearing shoes.  This patient presents for at risk foot care today.  General Appearance  Alert, conversant and in no acute stress.  Vascular  Dorsalis pedis and posterior tibial  pulses are  weakly palpable  bilaterally.  Capillary return is within normal limits  bilaterally. Temperature is within normal limits  bilaterally.  Neurologic  Senn-Weinstein monofilament wire test within normal limits  bilaterally. Muscle power within normal limits bilaterally.  Nails Thick disfigured discolored nails with subungual debris  from hallux to fifth toes bilaterally. No evidence of bacterial infection or drainage bilaterally.  Orthopedic  No limitations of motion  feet .  No crepitus or effusions noted.  No bony pathology or digital deformities noted.  Skin  normotropic skin with no porokeratosis noted bilaterally.  No signs of infections or ulcers noted.     Onychomycosis  Pain in right toes  Pain in left toes  Consent was obtained for treatment procedures.   Mechanical debridement of nails 1-5  bilaterally performed with a nail nipper.  Filed with dremel without incident. Cauterized fourth toe right foot.   Return office visit     3 months                 Told patient to return for periodic foot care and evaluation due to potential at risk complications.   Donald Moreno DPM   

## 2021-08-23 ENCOUNTER — Other Ambulatory Visit: Payer: Self-pay | Admitting: Family Medicine

## 2021-08-23 DIAGNOSIS — J45909 Unspecified asthma, uncomplicated: Secondary | ICD-10-CM

## 2021-09-06 ENCOUNTER — Ambulatory Visit (INDEPENDENT_AMBULATORY_CARE_PROVIDER_SITE_OTHER): Payer: Medicare Other | Admitting: Family Medicine

## 2021-09-06 ENCOUNTER — Encounter: Payer: Self-pay | Admitting: Family Medicine

## 2021-09-06 VITALS — BP 147/69 | HR 73 | Ht 70.0 in | Wt 160.0 lb

## 2021-09-06 DIAGNOSIS — M7632 Iliotibial band syndrome, left leg: Secondary | ICD-10-CM | POA: Diagnosis not present

## 2021-09-06 DIAGNOSIS — M87052 Idiopathic aseptic necrosis of left femur: Secondary | ICD-10-CM

## 2021-09-06 NOTE — Patient Instructions (Addendum)
It was so great seeing you today! Today we discussed the following: ? ?- You can try to put a pillow between your legs when you are sleeping as well to see if that helps. If you are propping your legs up at night, make sure you have enough support under your knees as well. ? ? ?Here are some exercises you can try at home.  ? ? ?Iliotibial Band Syndrome Rehab ?Ask your health care provider which exercises are safe for you. Do exercises exactly as told by your health care provider and adjust them as directed. It is normal to feel mild stretching, pulling, tightness, or discomfort as you do these exercises. Stop right away if you feel sudden pain or your pain gets significantly worse. Do not begin these exercises until told by your health care provider. ?Stretching and range-of-motion exercises ?These exercises warm up your muscles and joints and improve the movement and flexibility of your hip and pelvis. ?Quadriceps stretch, prone ? ?Lie on your abdomen (prone position) on a firm surface, such as a bed or padded floor. ?Bend your left / right knee and reach back to hold your ankle or pant leg. If you cannot reach your ankle or pant leg, loop a belt around your foot and grab the belt instead. ?Gently pull your heel toward your buttocks. Your knee should not slide out to the side. You should feel a stretch in the front of your thigh and knee (quadriceps). ?Hold this position for __________ seconds. ?Repeat __________ times. Complete this exercise __________ times a day. ?Iliotibial band stretch ?An iliotibial band is a strong band of muscle tissue that runs from the outer side of your hip to the outer side of your thigh and knee. ?Lie on your side with your left / right leg in the top position. ?Bend both of your knees and grab your left / right ankle. Stretch out your bottom arm to help you balance. ?Slowly bring your top knee back so your thigh goes behind your trunk. ?Slowly lower your top leg toward the floor until  you feel a gentle stretch on the outside of your left / right hip and thigh. If you do not feel a stretch and your knee will not fall farther, place the heel of your other foot on top of your knee and pull your knee down toward the floor with your foot. ?Hold this position for 20 seconds. ? ?Strengthening exercises ?These exercises build strength and endurance in your hip and pelvis. Endurance is the ability to use your muscles for a long time, even after they get tired. ?Straight leg raises, side-lying ?This exercise strengthens the muscles that rotate the leg at the hip and move it away from your body (hip abductors). ?Lie on your side with your left / right leg in the top position. Lie so your head, shoulder, hip, and knee line up. You may bend your bottom knee to help you balance. ?Roll your hips slightly forward so your hips are stacked directly over each other and your left / right knee is facing forward. ?Tense the muscles in your outer thigh and lift your top leg 4-6 inches (10-15 cm). ?Hold this position for 20 seconds. ?Slowly lower your leg to return to the starting position. Let your muscles relax completely before doing another repetition. ? ? ? ? ? ? ?

## 2021-09-06 NOTE — Assessment & Plan Note (Signed)
Stable.  Able to ambulate.  Continues to not want surgical intervention or pain management referral. ?

## 2021-09-06 NOTE — Progress Notes (Signed)
? ? ?  SUBJECTIVE:  ? ?CHIEF COMPLAINT / HPI:  ? ?Left hip pain ?Patient reports that he is continue to have left hip/leg pain.  He has known history of avascular necrosis and states that the pain is typical for him.  He is still able to walk 4-5 times per day.  He does note that the pain right now seems to be on the lateral side of his hip down to his lateral knee. ? ?He also notes that he has had a little bit of leg swelling at night and has been propping his legs up.  He is following with cardiology and all of his medications have been updated most recently. ? ?PERTINENT  PMH / PSH: Reviewed ? ?OBJECTIVE:  ? ?BP (!) 147/69   Pulse 73   Ht '5\' 10"'$  (1.778 m)   Wt 160 lb (72.6 kg)   SpO2 100%   BMI 22.96 kg/m?   ?General: NAD, well-appearing, well-nourished ?Respiratory: No respiratory distress, breathing comfortably, able to speak in full sentences ?Skin: warm and dry, no rashes noted on exposed skin ?Psych: Appropriate affect and mood ?MSK: TTP over the IT band (worse at insertion and origin points). Patient has full flexion of the left hip with passive and active movements. Gait is abnormal with favoring of the left side.  ? ?ASSESSMENT/PLAN:  ? ?Avascular necrosis of bone of left hip ([place]) ?Stable.  Able to ambulate.  Continues to not want surgical intervention or pain management referral. ? ?It band syndrome, left ?Physical exam seems most consistent with a possible IT band versus a bursitis.  Pain sometimes worsens at night, given patient's body habitus I feel that if he is sleeping on his side it could worsen the stretching of the band. ?- IT band stretches/exercises given ?- Recommended pillow between his legs if sleeping on the side ?- If patient sleeping on his back with legs propped up, ensuring that his knees are supported ?- Follow-up in the next 3 to 4 weeks if no improvement or if worsening can follow-up sooner ?  ? ? ?Dalyce Renne, DO ?[place]  ?

## 2021-09-06 NOTE — Assessment & Plan Note (Signed)
Physical exam seems most consistent with a possible IT band versus a bursitis.  Pain sometimes worsens at night, given patient's body habitus I feel that if he is sleeping on his side it could worsen the stretching of the band. ?- IT band stretches/exercises given ?- Recommended pillow between his legs if sleeping on the side ?- If patient sleeping on his back with legs propped up, ensuring that his knees are supported ?- Follow-up in the next 3 to 4 weeks if no improvement or if worsening can follow-up sooner ?

## 2021-09-21 ENCOUNTER — Telehealth: Payer: Self-pay

## 2021-09-21 NOTE — Telephone Encounter (Signed)
[name] NP with Donald Moreno nurse line to report abnormal finding at recent home visit.  ? ?Soni reports she performed a PAD scan and reports mild in right leg. Reports WNL in left leg. ? ?Patient reported continued leg swelling to NP.  ? ?Patient called and apt made for next week.  ? ?Red flags discussed.  ? ? ?

## 2021-09-27 ENCOUNTER — Encounter: Payer: Self-pay | Admitting: Family Medicine

## 2021-09-27 ENCOUNTER — Ambulatory Visit (INDEPENDENT_AMBULATORY_CARE_PROVIDER_SITE_OTHER): Payer: Medicare Other | Admitting: Family Medicine

## 2021-09-27 VITALS — BP 159/67 | HR 49 | Ht 70.0 in | Wt 167.1 lb

## 2021-09-27 DIAGNOSIS — M7989 Other specified soft tissue disorders: Secondary | ICD-10-CM | POA: Diagnosis not present

## 2021-09-27 NOTE — Patient Instructions (Signed)
The screening that the nurse completed showed that there is some mild disease in your left leg. The biggest things that we get worried about with leg vascular disease is if there is a lot of pain with walking (like cramping pain in your calf that stops you from walking further) or if there is a significant amount of swelling in your legs.  ? ?If you want, you can get over the counter compression stockings and try them out and see if you like them. If they hurt, then don't use them. The prescription stockings are too high in pressure for you and could cause more issues. ?

## 2021-09-27 NOTE — Progress Notes (Signed)
? ? ?  SUBJECTIVE:  ? ?CHIEF COMPLAINT / HPI:  ? ?Patient was told by his nurse that came out to do an evaluation on his legs to follow-up in the clinic.  He notes that chronic pain that is unchanged from his baseline and does not stop him from walking.  Per the report notes it appears that he has some mild vascular disease of the right lower leg.  He does not experience any claudication symptoms at this time.  Does not wish to see vascular if there option is going to be anything surgical. ? ? ?PERTINENT  PMH / PSH: Reviewed ? ?OBJECTIVE:  ? ?BP (!) 159/67   Pulse (!) 49   Ht '5\' 10"'$  (1.778 m)   Wt 167 lb 2 oz (75.8 kg)   SpO2 100%   BMI 23.98 kg/m?   ?General: NAD, well-appearing, well-nourished ?Respiratory: No respiratory distress, breathing comfortably, able to speak in full sentences ?Skin: warm and dry, no rashes noted on exposed skin ?Psych: Appropriate affect and mood ?CV: palpable pulses in RLE, no pain with palpation, mild edema around the lateral ankle.  ? ?ASSESSMENT/PLAN:  ? ?Leg swelling ?Mild swelling of the right lateral malleolus, not on tender to palpation.  Physical exam reassuring and patient remains active.  There was concern from a nurse completing a vascular disease screen for mild disease of the right extremity. No claudication symptoms at this time. ?- Can trial OTC compression stockings for the mild leg swelling (feel that graded compression would not be beneficial if patient has arterial issues) ?- Consider Vascular referral for further evaluation if claudication symptoms present ? ? ?Alan Drummer, DO ?[place]  ?

## 2021-09-28 ENCOUNTER — Other Ambulatory Visit: Payer: Self-pay | Admitting: Cardiology

## 2021-10-19 ENCOUNTER — Encounter: Payer: Self-pay | Admitting: *Deleted

## 2021-10-22 ENCOUNTER — Ambulatory Visit: Payer: Medicare Other | Admitting: Podiatry

## 2021-10-27 ENCOUNTER — Ambulatory Visit (INDEPENDENT_AMBULATORY_CARE_PROVIDER_SITE_OTHER): Payer: Medicare Other | Admitting: Family Medicine

## 2021-10-27 VITALS — BP 144/62 | HR 52 | Ht 70.0 in | Wt 164.6 lb

## 2021-10-27 DIAGNOSIS — M87052 Idiopathic aseptic necrosis of left femur: Secondary | ICD-10-CM | POA: Diagnosis not present

## 2021-10-27 DIAGNOSIS — M7989 Other specified soft tissue disorders: Secondary | ICD-10-CM | POA: Diagnosis not present

## 2021-10-27 DIAGNOSIS — I10 Essential (primary) hypertension: Secondary | ICD-10-CM

## 2021-10-27 MED ORDER — LIDOCAINE 5 % EX PTCH: 1.0000 | MEDICATED_PATCH | CUTANEOUS | 0 refills | Status: DC

## 2021-10-27 NOTE — Assessment & Plan Note (Addendum)
Largely stable, still not wanting surgical treatment.  Conservative management at this time.  We will trial lidocaine patches as patient never tried them before. - Lidocaine patches prescribed - Patient informed if not covered by insurance and get them OTC

## 2021-10-27 NOTE — Progress Notes (Signed)
    SUBJECTIVE:   CHIEF COMPLAINT / HPI:   Patient presents for follow-up of chronic leg pain.  He reports that he is still able to walk the several miles a day that he typically does.  He has had no acute change.  Does note that he has intermittent shooting down his left leg (which is delayed to has avascular necrosis).  Patient does not wish to pursue surgical intervention and is currently doing conservative management.  PERTINENT  PMH / PSH: Reviewed  OBJECTIVE:   BP (!) 144/62   Pulse (!) 52   Ht '5\' 10"'$  (1.778 m)   Wt 164 lb 9.6 oz (74.7 kg)   SpO2 100%   BMI 23.62 kg/m   General: NAD, well-appearing Respiratory: No respiratory distress, breathing comfortably, able to speak in full sentences Skin: warm and dry, no rashes noted on exposed skin Psych: Appropriate affect and mood Extremities: 1+ pitting edema of the left lower extremity (unchanged from prior), no significant swelling otherwise, no erythema or redness or tenderness to palpation.  ASSESSMENT/PLAN:   HYPERTENSION, BENIGN SYSTEMIC BP mildly elevated to 144/62 today.  Otherwise stable on losartan 100 mg daily, amlodipine 5 mg daily, this for the loss of 0.5 mg daily - Continue current regimen  Avascular necrosis of bone of left hip ([facility]) Largely stable, still not wanting surgical treatment.  Conservative management at this time.  We will trial lidocaine patches as patient never tried them before. - Lidocaine patches prescribed - Patient informed if not covered by insurance and get them OTC   Left leg swelling Stable from prior examinations.  No evidence of clot at this time.  Differential does include venous insufficiency but is not limiting his current function.  Do not want to wear tight socks at this time due to concerns for circulation problems that may worsen if compression stockings used. - Continue to monitor  [name], [place]

## 2021-10-27 NOTE — Assessment & Plan Note (Signed)
BP mildly elevated to 144/62 today.  Otherwise stable on losartan 100 mg daily, amlodipine 5 mg daily, this for the loss of 0.5 mg daily - Continue current regimen

## 2021-10-27 NOTE — Patient Instructions (Signed)
Everything looks great.  We are going to try a lidocaine patch and see if that helps any of your pain.  It is not covered by insurance just get it when you are able to, there is an over-the-counter version as well.  Lets follow-up in the next 3 or so months and we will get some lab work at that time.

## 2021-10-29 ENCOUNTER — Telehealth: Payer: Self-pay

## 2021-10-29 ENCOUNTER — Other Ambulatory Visit: Payer: Self-pay | Admitting: Cardiovascular Disease

## 2021-10-29 DIAGNOSIS — I10 Essential (primary) hypertension: Secondary | ICD-10-CM

## 2021-10-29 NOTE — Telephone Encounter (Signed)
A Prior Authorization was initiated for this patients LIDOCAINE PATCHES through CoverMyMeds.   Key: [id number]

## 2021-11-01 NOTE — Telephone Encounter (Signed)
Prior Auth for patients medication LIDOCAINE PATCHES denied by [facility] via CoverMyMeds.   Reason: not approved for medical condition.  Denial letter scanned to chart & pharmacy notified.  CoverMyMeds Key: [id number]

## 2021-11-02 ENCOUNTER — Other Ambulatory Visit: Payer: Self-pay | Admitting: Cardiovascular Disease

## 2021-11-16 NOTE — Progress Notes (Unsigned)
    SUBJECTIVE:   CHIEF COMPLAINT / HPI:   Bilateral leg swelling: patient has a h/o leg swelling and avascular necrosis of bone of left hip. He remains active, walks 5 miles a day. Patient last had an echo 8 months ago on 04/18/21, with normal EF 50-55%, normal right ventricular function, normal valves, IVC >50% with right atrial pressure 48mHg. Diagnosed with HFpEF.  Patient has a history of cirrhosis, no swelling in his abdomen.  He is on FIranand spironolactone, also has a history of CKD stage IIIb last creatinine January of this year was 1.82, in baseline range of 1.5-1.9.  Has previously been on furosemide, however his edema was overall well controlled then so he stopped taking it.  He reports that he puts his leg above the level of his heart most of the day when he is at home, and swelling does decrease.  Both of his legs are swollen, nonerythematous, they are not swollen and the morning when he wakes up.  We will check ABI today.  PERTINENT  PMH / PSH: Avascular necrosis of left hip, HFpEF, CKD stage IIIb  OBJECTIVE:   BP (!) 136/57   Pulse (!) 52   Wt 166 lb 2 oz (75.4 kg)   SpO2 100%   BMI 23.84 kg/m   Nursing note and vitals reviewed GEN: Elderly, AAM, resting comfortably in chair, NAD, WNWD HEENT: NCAT. PERRLA. Sclera without injection or icterus. MMM.  Cardiac: Regular rate and irregularly irregular rhythm. Normal S1/S2. No murmurs, rubs, or gallops appreciated. 2+ radial pulses. Lungs: Clear bilaterally to ascultation. No increased WOB, no accessory muscle usage. No w/r/r. Neuro: AOx3  Ext: 3+ pitting edema bilateral lower extremities, no erythema, or heat, bilateral lower extremities are symmetrically warm to touch, 2+ DP bilaterally Psych: Pleasant and appropriate  ASSESSMENT/PLAN:   Venous insufficiency of both lower extremities Based on history and exam, suspect that patient has bilateral venous insufficiency of the lower extremities.  Patient had recent echo that did  not show heart failure, his legs only swell in the evening, go down with elevation.  Patient has been reluctant to use compression stockings in the past.  He has been on furosemide in the past but was eventually taken off this about 2021 as his weight was stable and he was not having edema at that time.  ABIs today were normal 1.21 bilaterally, no significant concern for poor arterial circulation of the legs.  Patient has CKD stage IIIb, although Lasix could be used, would have to watch creatinine closely.  Patient is interested in trying compression stockings.  Recommend graduated compression stockings 20 to 30 mmHg.  Follow-up with PCP next week.     CGladys Damme MD [facility]

## 2021-11-17 ENCOUNTER — Encounter: Payer: Self-pay | Admitting: Family Medicine

## 2021-11-17 ENCOUNTER — Ambulatory Visit (INDEPENDENT_AMBULATORY_CARE_PROVIDER_SITE_OTHER): Payer: Medicare Other | Admitting: Family Medicine

## 2021-11-17 VITALS — BP 136/57 | HR 52 | Wt 166.1 lb

## 2021-11-17 DIAGNOSIS — I872 Venous insufficiency (chronic) (peripheral): Secondary | ICD-10-CM | POA: Diagnosis not present

## 2021-11-17 DIAGNOSIS — M7989 Other specified soft tissue disorders: Secondary | ICD-10-CM | POA: Diagnosis not present

## 2021-11-17 NOTE — Patient Instructions (Addendum)
It was a pleasure to see you today!  We will get some labs today.  If they are abnormal or we need to do something about them, I will call you.  If they are normal, I will send you a message on MyChart (if it is active) or a letter in the mail.  If you don't hear from [place] in 2 weeks, please call the office  [phone]. I recommend you try wearing wearing compression stockings with 20-30 mmHg of pressure to decrease swellin gin your legs Follow up in 2 weeks  Be Well,  Dr. Chauncey Reading

## 2021-11-17 NOTE — Assessment & Plan Note (Addendum)
Based on history and exam, suspect that patient has bilateral venous insufficiency of the lower extremities.  Patient had recent echo that did not show heart failure, his legs only swell in the evening, go down with elevation.  Patient has been reluctant to use compression stockings in the past.  He has been on furosemide in the past but was eventually taken off this about 2021 as his weight was stable and he was not having edema at that time.  ABIs today were normal 1.21 bilaterally, no significant concern for poor arterial circulation of the legs.  Patient has CKD stage IIIb, although Lasix could be used, would have to watch creatinine closely.  Patient is interested in trying compression stockings.  Recommend graduated compression stockings 20 to 30 mmHg.  Follow-up with PCP next week.  Last serum creatinine was obtained in January 2023, will obtain BMP today to an sure normal creatinine just in case Lasix does need to be started and patient cannot tolerate compression stockings.

## 2021-11-18 ENCOUNTER — Telehealth: Payer: Self-pay | Admitting: Family Medicine

## 2021-11-18 DIAGNOSIS — N179 Acute kidney failure, unspecified: Secondary | ICD-10-CM

## 2021-11-18 LAB — BASIC METABOLIC PANEL
BUN/Creatinine Ratio: 17 (ref 10–24)
BUN: 41 mg/dL — ABNORMAL HIGH (ref 8–27)
CO2: 17 mmol/L — ABNORMAL LOW (ref 20–29)
Calcium: 9.6 mg/dL (ref 8.6–10.2)
Chloride: 108 mmol/L — ABNORMAL HIGH (ref 96–106)
Creatinine, Ser: 2.4 mg/dL — ABNORMAL HIGH (ref 0.76–1.27)
Glucose: 87 mg/dL (ref 70–99)
Potassium: 5.8 mmol/L — ABNORMAL HIGH (ref 3.5–5.2)
Sodium: 141 mmol/L (ref 134–144)
eGFR: 27 mL/min/{1.73_m2} — ABNORMAL LOW (ref >59)

## 2021-11-18 NOTE — Telephone Encounter (Signed)
Patient with AKI and hyperkalemia in setting of losartan and spironolactone use. Recommend he hold losartan and spironolactone, hydrate well, and return tomorrow for a repeat BMP. Attempted to call patient, no answer, left HIPAA compliant VM. Will send mychart as well.  Gladys Damme, MD [facility], PGY-3

## 2021-11-19 NOTE — Telephone Encounter (Signed)
Patient has appointment with dr. Oleh Genin on 7/13 repeat labs will be done then. Attempted to reach patient to inform. No answer. LVM of message left by Dr. Chauncey Reading. Will try to reach patient later. Salvatore Marvel, CMA

## 2021-11-25 ENCOUNTER — Encounter: Payer: Self-pay | Admitting: Family Medicine

## 2021-11-25 ENCOUNTER — Ambulatory Visit (INDEPENDENT_AMBULATORY_CARE_PROVIDER_SITE_OTHER): Payer: Medicare Other | Admitting: Family Medicine

## 2021-11-25 VITALS — BP 136/55 | HR 60 | Ht 70.0 in | Wt 164.4 lb

## 2021-11-25 DIAGNOSIS — E875 Hyperkalemia: Secondary | ICD-10-CM | POA: Diagnosis not present

## 2021-11-25 DIAGNOSIS — I10 Essential (primary) hypertension: Secondary | ICD-10-CM

## 2021-11-25 DIAGNOSIS — M87052 Idiopathic aseptic necrosis of left femur: Secondary | ICD-10-CM

## 2021-11-25 MED ORDER — TRAMADOL HCL 50 MG PO TABS: 25.0000 mg | ORAL_TABLET | Freq: Every evening | ORAL | 0 refills | Status: DC | PRN

## 2021-11-25 NOTE — Assessment & Plan Note (Signed)
Worsening pain. Continuing conservative management. Given interference with sleeping, will plan for opiate-like medication to assist with sleep and pain. Limited on OTC use given cirrhosis and CKD. - Tramadol '25mg'$  QHS PRN, can increase to '50mg'$  if necessary for pain control - Follow-up in 3-4 weeks to monitor

## 2021-11-25 NOTE — Progress Notes (Deleted)
    SUBJECTIVE:   CHIEF COMPLAINT / HPI:   Avascular necrosis of hip  Leg swelling - slightly improving with compression stockings  PERTINENT  PMH / PSH: ***  OBJECTIVE:   BP (!) 136/55   Pulse 60   Ht '5\' 10"'$  (1.778 m)   Wt 164 lb 6 oz (74.6 kg)   SpO2 99%   BMI 23.59 kg/m   ***  ASSESSMENT/PLAN:   No problem-specific Assessment & Plan notes found for this encounter.     [name], [place]

## 2021-11-25 NOTE — Patient Instructions (Addendum)
I am sending in a medication called tramadol for you to take at night.  I want you to try to take half a tablet first as it can have stronger effects.  If the half tablet does not help you after a few days, you can increase to a full tablet.  We are going to get labs today.  I will call you with any abnormal results.  Continue using the compression stockings, I do want to follow-up in the next 2 to 4 weeks about your leg swelling if it is getting worse.

## 2021-11-25 NOTE — Progress Notes (Signed)
    SUBJECTIVE:   CHIEF COMPLAINT / HPI:   Avascular necrosis of hip - Pain has been increasing at night - Limited in OTC medication use due to hx of cirrhosis and CKD - Ambulation is now slowing and he is "walking crooked" due to pain - Ambulates with cane at baseline  Leg swelling - slightly improving with compression stockings - Elevating leg mainly at night  PERTINENT  PMH / PSH: Cirrhosis, CKD, HFpEF  OBJECTIVE:   BP (!) 136/55   Pulse 60   Ht '5\' 10"'$  (1.778 m)   Wt 164 lb 6 oz (74.6 kg)   SpO2 99%   BMI 23.59 kg/m   General: NAD, well-appearing, well-nourished Respiratory: No respiratory distress, breathing comfortably, able to speak in full sentences Skin: warm and dry, no rashes noted on exposed skin Psych: Appropriate affect and mood Gait: abnormal with left hip limp, ambulates with cane Extremities: 2+ pitting edema in BLE, compression socks in place  ASSESSMENT/PLAN:   Avascular necrosis of bone of left hip (HCC) Worsening pain. Continuing conservative management. Given interference with sleeping, will plan for opiate-like medication to assist with sleep and pain. Limited on OTC use given cirrhosis and CKD. - Tramadol '25mg'$  QHS PRN, can increase to '50mg'$  if necessary for pain control - Follow-up in 3-4 weeks to monitor    Hyperkalemia 5.8 on last BMP, patient didn't receive message regarding holding medication and continued spironolactone and losartan. - BMP today  Leg swelling Hx of cirrhosis and HFpEF, no other symptoms or swelling at this time. Will hold off on diuretics for now given we are monitoring kidneys today. May need to consider in the future and evaluation for worsening HF. - Continue compression socks - Consider echocardiogram  Donald Moreno, [place]

## 2021-11-26 ENCOUNTER — Telehealth: Payer: Self-pay | Admitting: Family Medicine

## 2021-11-26 DIAGNOSIS — E875 Hyperkalemia: Secondary | ICD-10-CM

## 2021-11-26 LAB — BASIC METABOLIC PANEL
BUN/Creatinine Ratio: 15 (ref 10–24)
BUN: 37 mg/dL — ABNORMAL HIGH (ref 8–27)
CO2: 17 mmol/L — ABNORMAL LOW (ref 20–29)
Calcium: 9.8 mg/dL (ref 8.6–10.2)
Chloride: 112 mmol/L — ABNORMAL HIGH (ref 96–106)
Creatinine, Ser: 2.39 mg/dL — ABNORMAL HIGH (ref 0.76–1.27)
Glucose: 82 mg/dL (ref 70–99)
Potassium: 6.1 mmol/L — ABNORMAL HIGH (ref 3.5–5.2)
Sodium: 143 mmol/L (ref 134–144)
eGFR: 27 mL/min/{1.73_m2} — ABNORMAL LOW (ref >59)

## 2021-11-26 MED ORDER — LOKELMA 10 G PO PACK: 10.0000 g | PACK | Freq: Every day | ORAL | 0 refills | Status: AC

## 2021-11-26 NOTE — Telephone Encounter (Signed)
Called patient regarding lab results, patient did not answer phone. Voicemail was left per patient request at last visit.   Hyperkalemia - STOP losartan and spironolactone - Lokelma '10mg'$  x2 days - Repeat BMP on Monday (future ordered).   Tamsyn Owusu, DO

## 2021-11-29 ENCOUNTER — Other Ambulatory Visit: Payer: Medicare Other

## 2021-11-29 DIAGNOSIS — E875 Hyperkalemia: Secondary | ICD-10-CM | POA: Diagnosis not present

## 2021-11-30 ENCOUNTER — Telehealth: Payer: Self-pay | Admitting: Family Medicine

## 2021-11-30 DIAGNOSIS — E875 Hyperkalemia: Secondary | ICD-10-CM

## 2021-11-30 LAB — BASIC METABOLIC PANEL
BUN/Creatinine Ratio: 17 (ref 10–24)
BUN: 44 mg/dL — ABNORMAL HIGH (ref 8–27)
CO2: 16 mmol/L — ABNORMAL LOW (ref 20–29)
Calcium: 9.5 mg/dL (ref 8.6–10.2)
Chloride: 105 mmol/L (ref 96–106)
Creatinine, Ser: 2.59 mg/dL — ABNORMAL HIGH (ref 0.76–1.27)
Glucose: 93 mg/dL (ref 70–99)
Potassium: 5.8 mmol/L — ABNORMAL HIGH (ref 3.5–5.2)
Sodium: 135 mmol/L (ref 134–144)
eGFR: 24 mL/min/{1.73_m2} — ABNORMAL LOW (ref >59)

## 2021-11-30 MED ORDER — LOKELMA 10 G PO PACK: 10.0000 g | PACK | Freq: Every day | ORAL | 0 refills | Status: AC

## 2021-11-30 NOTE — Telephone Encounter (Signed)
Patient potassium remains elevated at 5.8. At this time we are going to continue holding the spironolactone and losartan. I am sending in 3 more days of lokelma and patient will come in to get repeat BMP on Friday 7/21.   Voicemail regarding this information was left for patient per his request.    Herschel Fleagle, DO

## 2021-12-01 ENCOUNTER — Other Ambulatory Visit: Payer: Self-pay | Admitting: Gastroenterology

## 2021-12-01 DIAGNOSIS — K7460 Unspecified cirrhosis of liver: Secondary | ICD-10-CM

## 2021-12-03 ENCOUNTER — Other Ambulatory Visit: Payer: Medicare Other

## 2021-12-03 ENCOUNTER — Other Ambulatory Visit: Payer: Self-pay | Admitting: Cardiovascular Disease

## 2021-12-03 DIAGNOSIS — E875 Hyperkalemia: Secondary | ICD-10-CM

## 2021-12-04 LAB — BASIC METABOLIC PANEL
BUN/Creatinine Ratio: 20 (ref 10–24)
BUN: 48 mg/dL — ABNORMAL HIGH (ref 8–27)
CO2: 18 mmol/L — ABNORMAL LOW (ref 20–29)
Calcium: 9.8 mg/dL (ref 8.6–10.2)
Chloride: 106 mmol/L (ref 96–106)
Creatinine, Ser: 2.37 mg/dL — ABNORMAL HIGH (ref 0.76–1.27)
Glucose: 77 mg/dL (ref 70–99)
Potassium: 5.6 mmol/L — ABNORMAL HIGH (ref 3.5–5.2)
Sodium: 136 mmol/L (ref 134–144)
eGFR: 27 mL/min/{1.73_m2} — ABNORMAL LOW (ref >59)

## 2021-12-06 ENCOUNTER — Ambulatory Visit
Admission: RE | Admit: 2021-12-06 | Discharge: 2021-12-06 | Disposition: A | Discharge status: Home / Self Care | Payer: Medicare Other | Source: Ambulatory Visit | Attending: Gastroenterology | Admitting: Gastroenterology

## 2021-12-06 DIAGNOSIS — K7689 Other specified diseases of liver: Secondary | ICD-10-CM | POA: Diagnosis not present

## 2021-12-06 DIAGNOSIS — K8020 Calculus of gallbladder without cholecystitis without obstruction: Secondary | ICD-10-CM | POA: Diagnosis not present

## 2021-12-06 DIAGNOSIS — K7460 Unspecified cirrhosis of liver: Secondary | ICD-10-CM

## 2021-12-08 ENCOUNTER — Telehealth: Payer: Self-pay | Admitting: Family Medicine

## 2021-12-08 MED ORDER — TRAMADOL HCL 50 MG PO TABS: 50.0000 mg | ORAL_TABLET | Freq: Every evening | ORAL | 0 refills | Status: DC | PRN

## 2021-12-08 MED ORDER — LOKELMA 10 G PO PACK: 10.0000 g | PACK | ORAL | 0 refills | Status: AC

## 2021-12-08 NOTE — Telephone Encounter (Signed)
Called patient to check in and also discuss previous labs. Patient has appointment with me for follow-up next week.   Given elevated potassium still persists, we will do '10mg'$  of Lokelma every other day (Thursday, Saturday, and Monday) to bridge to our appointment and will recheck labs at that time.   Patient reports continued pain in his left leg that I feel may be limiting somewhat, though he is still getting up and walking through the pain/discomfort. Will refill for a higher dose of the tramadol ('50mg'$  instead of the previous '25mg'$ ) and see if this helps any. Will follow-up in clinic at next appointment.    Donald Eckley, DO

## 2021-12-10 ENCOUNTER — Other Ambulatory Visit: Payer: Self-pay | Admitting: Family Medicine

## 2021-12-10 DIAGNOSIS — M109 Gout, unspecified: Secondary | ICD-10-CM

## 2021-12-15 ENCOUNTER — Ambulatory Visit (INDEPENDENT_AMBULATORY_CARE_PROVIDER_SITE_OTHER): Payer: Medicare Other | Admitting: Family Medicine

## 2021-12-15 ENCOUNTER — Encounter: Payer: Self-pay | Admitting: Family Medicine

## 2021-12-15 VITALS — BP 148/63 | HR 70 | Ht 70.0 in | Wt 170.1 lb

## 2021-12-15 DIAGNOSIS — E875 Hyperkalemia: Secondary | ICD-10-CM | POA: Diagnosis not present

## 2021-12-15 DIAGNOSIS — I10 Essential (primary) hypertension: Secondary | ICD-10-CM | POA: Diagnosis not present

## 2021-12-15 DIAGNOSIS — N184 Chronic kidney disease, stage 4 (severe): Secondary | ICD-10-CM

## 2021-12-15 NOTE — Patient Instructions (Signed)
We are going to go ahead and check your blood work today to check on your kidneys, blood count levels and vitamin D level.  I am sending in a referral to nephrology (the kidney doctor) to help with your medication management as well.  We are also going to get an ultrasound of your kidneys to make sure there is no abnormality happening that is causing your labs to be abnormal.

## 2021-12-15 NOTE — Progress Notes (Signed)
    SUBJECTIVE:   CHIEF COMPLAINT / HPI:   Hyperkalemia - Has not taken spironolactone or losartan since hyperkalemic several weeks ago - Has been taking Lokelma with intermittently prescribed, 3 times in the last week  CKD stage IV - Previously thought to be seeing a nephrologist, turns out patient had a specialty confused with nephrology  Hypertension - Has been discontinued off of losartan and spironolactone due to hyperkalemia - Has been compliant with all her medications  PERTINENT  PMH / PSH: Reviewed  OBJECTIVE:   BP (!) 148/63   Pulse 70   Ht '5\' 10"'$  (1.778 m)   Wt 170 lb 2 oz (77.2 kg)   SpO2 100%   BMI 24.41 kg/m   General: NAD, well-appearing, well-nourished Respiratory: No respiratory distress, breathing comfortably, able to speak in full sentences Skin: warm and dry, no rashes noted on exposed skin Psych: Appropriate affect and mood Extremities: 1+ pitting edema in BLE  ASSESSMENT/PLAN:   CKD (chronic kidney disease) stage 3, GFR 30-59 ml/min (HCC) Has advanced to stage IV.  Was previously following with [facility] per overview from prior PCP but patient reports he has not seen them in quite some time.  Unclear etiology of persistent hyperkalemia, patient also noted to have decrease in GFR in the last 6 months. - Referral to nephrology placed again - CMP, CBC, vitamin D today - Renal ultrasound ordered  HYPERTENSION, BENIGN SYSTEMIC BP elevated 148/63.  Not currently on losartan or spironolactone due to hyperkalemia that is persistent.  Will await results of CMP before prescription of medications given persistent hyperkalemia -CMP today     Pavlos Yon, DO [place]

## 2021-12-15 NOTE — Assessment & Plan Note (Signed)
BP elevated 148/63.  Not currently on losartan or spironolactone due to hyperkalemia that is persistent.  Will await results of CMP before prescription of medications given persistent hyperkalemia -CMP today

## 2021-12-15 NOTE — Assessment & Plan Note (Signed)
Has advanced to stage IV.  Was previously following with [facility] per overview from prior PCP but patient reports he has not seen them in quite some time.  Unclear etiology of persistent hyperkalemia, patient also noted to have decrease in GFR in the last 6 months. - Referral to nephrology placed again - CMP, CBC, vitamin D today - Renal ultrasound ordered

## 2021-12-16 LAB — COMPREHENSIVE METABOLIC PANEL
ALT: 43 IU/L (ref 0–44)
AST: 73 IU/L — ABNORMAL HIGH (ref 0–40)
Albumin/Globulin Ratio: 1.3 (ref 1.2–2.2)
Albumin: 4.3 g/dL (ref 3.8–4.8)
Alkaline Phosphatase: 183 IU/L — ABNORMAL HIGH (ref 44–121)
BUN/Creatinine Ratio: 13 (ref 10–24)
BUN: 29 mg/dL — ABNORMAL HIGH (ref 8–27)
Bilirubin Total: 1.1 mg/dL (ref 0.0–1.2)
CO2: 18 mmol/L — ABNORMAL LOW (ref 20–29)
Calcium: 9.8 mg/dL (ref 8.6–10.2)
Chloride: 108 mmol/L — ABNORMAL HIGH (ref 96–106)
Creatinine, Ser: 2.25 mg/dL — ABNORMAL HIGH (ref 0.76–1.27)
Globulin, Total: 3.3 g/dL (ref 1.5–4.5)
Glucose: 90 mg/dL (ref 70–99)
Potassium: 5 mmol/L (ref 3.5–5.2)
Sodium: 141 mmol/L (ref 134–144)
Total Protein: 7.6 g/dL (ref 6.0–8.5)
eGFR: 29 mL/min/{1.73_m2} — ABNORMAL LOW (ref >59)

## 2021-12-16 LAB — CBC
Hematocrit: 30.8 % — ABNORMAL LOW (ref 37.5–51.0)
Hemoglobin: 11 g/dL — ABNORMAL LOW (ref 13.0–17.7)
MCH: 34.5 pg — ABNORMAL HIGH (ref 26.6–33.0)
MCHC: 35.7 g/dL (ref 31.5–35.7)
MCV: 97 fL (ref 79–97)
Platelets: 131 10*3/uL — ABNORMAL LOW (ref 150–450)
RBC: 3.19 x10E6/uL — ABNORMAL LOW (ref 4.14–5.80)
RDW: 12.1 % (ref 11.6–15.4)
WBC: 5.5 10*3/uL (ref 3.4–10.8)

## 2021-12-16 LAB — VITAMIN D 25 HYDROXY (VIT D DEFICIENCY, FRACTURES): Vit D, 25-Hydroxy: 50.7 ng/mL (ref 30.0–100.0)

## 2021-12-17 ENCOUNTER — Ambulatory Visit (HOSPITAL_COMMUNITY)
Admission: RE | Admit: 2021-12-17 | Discharge: 2021-12-17 | Disposition: A | Discharge status: Home / Self Care | Payer: Medicare Other | Source: Ambulatory Visit | Attending: Family Medicine | Admitting: Family Medicine

## 2021-12-17 DIAGNOSIS — N184 Chronic kidney disease, stage 4 (severe): Secondary | ICD-10-CM | POA: Diagnosis present

## 2021-12-17 DIAGNOSIS — E875 Hyperkalemia: Secondary | ICD-10-CM | POA: Diagnosis present

## 2021-12-27 ENCOUNTER — Other Ambulatory Visit: Payer: Self-pay

## 2021-12-27 MED ORDER — TRAMADOL HCL 50 MG PO TABS: 50.0000 mg | ORAL_TABLET | Freq: Every evening | ORAL | 0 refills | Status: DC | PRN

## 2021-12-31 ENCOUNTER — Other Ambulatory Visit: Payer: Self-pay | Admitting: Cardiovascular Disease

## 2022-01-19 ENCOUNTER — Encounter: Payer: Self-pay | Admitting: Podiatry

## 2022-01-19 ENCOUNTER — Ambulatory Visit (INDEPENDENT_AMBULATORY_CARE_PROVIDER_SITE_OTHER): Payer: Medicare Other | Admitting: Podiatry

## 2022-01-19 DIAGNOSIS — N183 Chronic kidney disease, stage 3 unspecified: Secondary | ICD-10-CM

## 2022-01-19 DIAGNOSIS — B351 Tinea unguium: Secondary | ICD-10-CM

## 2022-01-19 DIAGNOSIS — I872 Venous insufficiency (chronic) (peripheral): Secondary | ICD-10-CM

## 2022-01-19 NOTE — Progress Notes (Signed)
This patient returns to my office for at risk foot care.  This patient requires this care by a professional since this patient will be at risk due to having CKD.  This patient is unable to cut nails himself since the patient cannot reach his nails.These nails are painful walking and wearing shoes.  This patient presents for at risk foot care today.  General Appearance  Alert, conversant and in no acute stress.  Vascular  Dorsalis pedis and posterior tibial  pulses are  weakly palpable  bilaterally.  Capillary return is within normal limits  bilaterally. Temperature is within normal limits  bilaterally.  Neurologic  Senn-Weinstein monofilament wire test within normal limits  bilaterally. Muscle power within normal limits bilaterally.  Nails Thick disfigured discolored nails with subungual debris  from hallux to fifth toes bilaterally. No evidence of bacterial infection or drainage bilaterally.  Orthopedic  No limitations of motion  feet .  No crepitus or effusions noted.  No bony pathology or digital deformities noted.  Skin  normotropic skin with no porokeratosis noted bilaterally.  No signs of infections or ulcers noted.     Onychomycosis  Pain in right toes  Pain in left toes  Consent was obtained for treatment procedures.   Mechanical debridement of nails 1-5  bilaterally performed with a nail nipper.  Filed with dremel without incident. Cauterized fourth toe right foot.   Return office visit     3 months                 Told patient to return for periodic foot care and evaluation due to potential at risk complications.   Gardiner Barefoot DPM

## 2022-01-27 ENCOUNTER — Other Ambulatory Visit: Payer: Self-pay | Admitting: Cardiovascular Disease

## 2022-01-30 NOTE — Progress Notes (Unsigned)
    SUBJECTIVE:   CHIEF COMPLAINT / HPI:   Blood pressure - Previously discontinued BP meds due to hyperkalemia  CKD stage 4 - Referred to nephrology last visit (appears to be with [facility])  Avascular necrosis  -Patient is still able to ambulate during the day - Pain typically limits him we work at night  PERTINENT  PMH / PSH: Reviewed  OBJECTIVE:   BP 133/62   Pulse (!) 55   Ht '5\' 10"'$  (1.778 m)   Wt 153 lb 9.6 oz (69.7 kg)   SpO2 100%   BMI 22.04 kg/m   General: NAD, well-appearing, well-nourished Respiratory: No respiratory distress, breathing comfortably, able to speak in full sentences Skin: warm and dry, no rashes noted on exposed skin Psych: Appropriate affect and mood Gait: leaning to the side secondary to pain, short and abnormal gait pattern  ASSESSMENT/PLAN:   HYPERTENSION, BENIGN SYSTEMIC BP well controlled in the office today at 133/62.  Currently on spironolactone 50 mg daily, amlodipine 5 mg daily, bisoprolol 5 mg. - Continue current regimen - BMP to evaluate potassium  Avascular necrosis of bone of left hip (HCC) Persistent pain, slightly improved with the tramadol dosing from before. - Refill of tramadol 50 mg nightly provided  CKD (chronic kidney disease) stage 4, GFR 15-29 ml/min (HCC) Awaiting follow-up with [facility]. - BMP today   Flu shot administered today  [name], [place]

## 2022-01-31 ENCOUNTER — Encounter: Payer: Self-pay | Admitting: Family Medicine

## 2022-01-31 ENCOUNTER — Ambulatory Visit (INDEPENDENT_AMBULATORY_CARE_PROVIDER_SITE_OTHER): Payer: Medicare Other | Admitting: Family Medicine

## 2022-01-31 VITALS — BP 133/62 | HR 55 | Ht 70.0 in | Wt 153.6 lb

## 2022-01-31 DIAGNOSIS — Z23 Encounter for immunization: Secondary | ICD-10-CM

## 2022-01-31 DIAGNOSIS — I10 Essential (primary) hypertension: Secondary | ICD-10-CM | POA: Diagnosis not present

## 2022-01-31 DIAGNOSIS — N184 Chronic kidney disease, stage 4 (severe): Secondary | ICD-10-CM | POA: Diagnosis not present

## 2022-01-31 DIAGNOSIS — M87052 Idiopathic aseptic necrosis of left femur: Secondary | ICD-10-CM | POA: Diagnosis not present

## 2022-01-31 MED ORDER — TRAMADOL HCL 50 MG PO TABS: 50.0000 mg | ORAL_TABLET | Freq: Every evening | ORAL | 0 refills | Status: DC | PRN

## 2022-01-31 NOTE — Assessment & Plan Note (Signed)
Awaiting follow-up with [facility]. - BMP today

## 2022-01-31 NOTE — Assessment & Plan Note (Signed)
BP well controlled in the office today at 133/62.  Currently on spironolactone 50 mg daily, amlodipine 5 mg daily, bisoprolol 5 mg. - Continue current regimen - BMP to evaluate potassium

## 2022-01-31 NOTE — Patient Instructions (Addendum)
Your blood pressure looks beautiful today!  We are going to get a lab just to make sure your potassium is okay, if it is normal you will get a call from the and we will just follow-up in the next couple of months or whenever you need.  If it is abnormal I will call you with the when you are due for follow-up.

## 2022-01-31 NOTE — Assessment & Plan Note (Signed)
Persistent pain, slightly improved with the tramadol dosing from before. - Refill of tramadol 50 mg nightly provided

## 2022-02-01 ENCOUNTER — Telehealth: Payer: Self-pay | Admitting: Family Medicine

## 2022-02-01 LAB — BASIC METABOLIC PANEL WITH GFR
BUN/Creatinine Ratio: 15 (ref 10–24)
BUN: 32 mg/dL — ABNORMAL HIGH (ref 8–27)
CO2: 15 mmol/L — ABNORMAL LOW (ref 20–29)
Calcium: 9.2 mg/dL (ref 8.6–10.2)
Chloride: 107 mmol/L — ABNORMAL HIGH (ref 96–106)
Creatinine, Ser: 2.11 mg/dL — ABNORMAL HIGH (ref 0.76–1.27)
Glucose: 87 mg/dL (ref 70–99)
Potassium: 4.7 mmol/L (ref 3.5–5.2)
Sodium: 138 mmol/L (ref 134–144)
eGFR: 31 mL/min/1.73 — ABNORMAL LOW

## 2022-02-01 NOTE — Telephone Encounter (Signed)
Called patient regarding results, potassium reassuringly stable. Will continue current management as blood pressure is controlled and encouraged follow-up with nephrology.    Jaeceon Michelin, DO

## 2022-02-18 ENCOUNTER — Other Ambulatory Visit: Payer: Self-pay | Admitting: Family Medicine

## 2022-02-18 DIAGNOSIS — J45909 Unspecified asthma, uncomplicated: Secondary | ICD-10-CM

## 2022-02-26 ENCOUNTER — Other Ambulatory Visit: Payer: Self-pay | Admitting: Cardiovascular Disease

## 2022-02-26 DIAGNOSIS — I10 Essential (primary) hypertension: Secondary | ICD-10-CM

## 2022-03-07 ENCOUNTER — Other Ambulatory Visit: Payer: Self-pay | Admitting: Interventional Radiology

## 2022-03-07 ENCOUNTER — Other Ambulatory Visit: Payer: Self-pay

## 2022-03-07 DIAGNOSIS — Z95828 Presence of other vascular implants and grafts: Secondary | ICD-10-CM

## 2022-03-07 MED ORDER — TRAMADOL HCL 50 MG PO TABS: 50.0000 mg | ORAL_TABLET | Freq: Every evening | ORAL | 0 refills | Status: DC | PRN

## 2022-03-16 ENCOUNTER — Other Ambulatory Visit: Payer: Self-pay | Admitting: Cardiovascular Disease

## 2022-03-16 ENCOUNTER — Other Ambulatory Visit: Payer: Medicare Other

## 2022-03-16 DIAGNOSIS — I10 Essential (primary) hypertension: Secondary | ICD-10-CM

## 2022-03-24 ENCOUNTER — Other Ambulatory Visit: Payer: Self-pay | Admitting: Cardiovascular Disease

## 2022-03-24 DIAGNOSIS — I10 Essential (primary) hypertension: Secondary | ICD-10-CM

## 2022-03-31 ENCOUNTER — Other Ambulatory Visit: Payer: Self-pay | Admitting: Family Medicine

## 2022-04-13 ENCOUNTER — Other Ambulatory Visit: Payer: Self-pay | Admitting: Cardiovascular Disease

## 2022-04-13 DIAGNOSIS — I10 Essential (primary) hypertension: Secondary | ICD-10-CM

## 2022-04-19 ENCOUNTER — Ambulatory Visit: Payer: Medicare Other | Admitting: Cardiovascular Disease

## 2022-04-20 ENCOUNTER — Encounter: Payer: Self-pay | Admitting: Podiatry

## 2022-04-20 ENCOUNTER — Ambulatory Visit (INDEPENDENT_AMBULATORY_CARE_PROVIDER_SITE_OTHER): Payer: Medicare Other | Admitting: Podiatry

## 2022-04-20 DIAGNOSIS — L84 Corns and callosities: Secondary | ICD-10-CM | POA: Diagnosis not present

## 2022-04-20 DIAGNOSIS — N183 Chronic kidney disease, stage 3 unspecified: Secondary | ICD-10-CM | POA: Diagnosis not present

## 2022-04-20 DIAGNOSIS — I872 Venous insufficiency (chronic) (peripheral): Secondary | ICD-10-CM

## 2022-04-20 DIAGNOSIS — B351 Tinea unguium: Secondary | ICD-10-CM

## 2022-04-20 NOTE — Progress Notes (Signed)
This patient returns to my office for at risk foot care.  This patient requires this care by a professional since this patient will be at risk due to having CKD.  This patient is unable to cut nails himself since the patient cannot reach his nails.These nails are painful walking and wearing shoes. Painful callus right foot.  This patient presents for at risk foot care today.  General Appearance  Alert, conversant and in no acute stress.  Vascular  Dorsalis pedis and posterior tibial  pulses are  weakly palpable  bilaterally.  Capillary return is within normal limits  bilaterally. Temperature is within normal limits  bilaterally.  Neurologic  Senn-Weinstein monofilament wire test within normal limits  bilaterally. Muscle power within normal limits bilaterally.  Nails Thick disfigured discolored nails with subungual debris  from hallux to fifth toes bilaterally. No evidence of bacterial infection or drainage bilaterally.  Orthopedic  No limitations of motion  feet .  No crepitus or effusions noted.  No bony pathology or digital deformities noted.  Skin  normotropic skin with no porokeratosis noted bilaterally.  No signs of infections or ulcers noted.   Callus sub 1 right foot.  Onychomycosis  Pain in right toes  Pain in left toes  Consent was obtained for treatment procedures.   Mechanical debridement of nails 1-5  bilaterally performed with a nail nipper.  Filed with dremel without incident. Debride callus with # 15 blade and dremel tool.   Return office visit     3 months                 Told patient to return for periodic foot care and evaluation due to potential at risk complications.   Gardiner Barefoot DPM

## 2022-04-25 ENCOUNTER — Other Ambulatory Visit: Payer: Self-pay | Admitting: Family Medicine

## 2022-04-27 ENCOUNTER — Encounter: Payer: Self-pay | Admitting: Cardiovascular Disease

## 2022-04-27 ENCOUNTER — Ambulatory Visit: Payer: Medicare Other | Attending: Cardiovascular Disease | Admitting: Cardiovascular Disease

## 2022-04-27 VITALS — BP 132/59 | HR 56 | Ht 70.5 in | Wt 153.6 lb

## 2022-04-27 DIAGNOSIS — I2721 Secondary pulmonary arterial hypertension: Secondary | ICD-10-CM

## 2022-04-27 DIAGNOSIS — I483 Typical atrial flutter: Secondary | ICD-10-CM | POA: Diagnosis not present

## 2022-04-27 DIAGNOSIS — K7030 Alcoholic cirrhosis of liver without ascites: Secondary | ICD-10-CM

## 2022-04-27 DIAGNOSIS — N1832 Chronic kidney disease, stage 3b: Secondary | ICD-10-CM

## 2022-04-27 DIAGNOSIS — I5032 Chronic diastolic (congestive) heart failure: Secondary | ICD-10-CM

## 2022-04-27 MED ORDER — BISOPROLOL FUMARATE 5 MG PO TABS: ORAL_TABLET | ORAL | 3 refills | Status: DC

## 2022-04-27 NOTE — Progress Notes (Signed)
Cardiology office note    Date:  04/27/2022   ID:  [name], DOB 07-May-1941, MRN [id number]   PCP:  [name], DO  Cardiologist:  Sanda Klein, MD  Electrophysiologist:  None   Evaluation Performed:  Follow-Up Visit  Chief Complaint:  AFlutter, CHF  History of Present Illness:    Donald Moreno is a 82 y.o. male with recurrent persistent atrial flutter,  congestive heart failure with preserved left ventricular systolic function during periods of RVR, markedly improved after arrhythmia control.  He was initially managed with amiodarone but this had to be stopped due to junctional rhythm.  He has advanced chronic liver disease (cirrhosis, history of multiple paracentesis for massive ascites, improved after TIPS).  Because of his liver problems with spontaneously elevated prothrombin time, he is not anticoagulated.  In April 2021 had problems with RVR during COVID infection. In May 2022 had asymptomatic bradycardia (Atrial flutter with slow ventricular response). Bisoprolol dose was reduced. Echo during that admission showed moderate PAH with normal right ventricular function.   On event monitor in January 2020, he had persistent atrial flutter with rates of 38-129 bpm, average 74 bpm and occasional pauses over 3 seconds.  He reports doing quite well except for some unsteadiness in his gait and pain in his right leg.  He walks with a cane.  He has not had any falls or bleeding problems.  He denies shortness of breath or chest pain at rest or with activity.  He continues to live independently, with support from his niece who is a Dietitian.  He has not had palpitations, dizziness or syncope.  He has never had overt hepatic encephalopathy.  He is no longer taking [place].  Spironolactone was stopped for hyperkalemia over the summer when he received a few doses of Lokelma.  Still reports urinating "a lot".  He has lost quite a bit of weight since last year, about 10 pounds.   His creatinine is 1.97, but this is a substantial improvement compared to assays earlier this year when it was 2.2-2.6, and is only slightly worse than his creatinine a year ago at 1.5-2.0.   Past Medical History:  Diagnosis Date   Acute congestive heart failure ([place])    Arthritis    "all over"   Asthma    Blood in stool 04/11/2017   Chronic kidney disease    "I see a kidney dr @ [facility]" (10/11/2016)   Chronic lower back pain    "turned a truck over a long time ago" (10/11/2016)   Dysrhythmia    bradycardia with occassional junctional rhythm   ETOH abuse    Gout    Hypertension    Inguinal hernia 10/28/2014   S/P inguinal hernia repair with 4L as ascites removed 03/30/17   Liver cirrhosis ([place]) 2015   Macrocytosis    Pneumonia    "long long time ago" (10/11/2016)   Spontaneous bacterial peritonitis ([place]) 07/01/2015   Past Surgical History:  Procedure Laterality Date   INGUINAL HERNIA REPAIR Bilateral 03/30/2017   Procedure: OPEN BILATERAL INGUINAL HERNIA REPAIR WITH MESH;  Surgeon: Kinsinger, Arta Bruce, MD;  Location: [facility];  Service: General;  Laterality: Bilateral;  GENERAL COMBINED WITH REGIONAL FOR POST OP PAIN    INSERTION OF MESH N/A 10/16/2016   Procedure: INSERTION OF MESH;  Surgeon: Kieth Brightly Arta Bruce, MD;  Location: [facility];  Service: General;  Laterality: N/A;   INSERTION OF MESH Bilateral 03/30/2017   Procedure: INSERTION OF MESH;  Surgeon: Kinsinger, Arta Bruce, MD;  [facility];  Service: General;  Laterality: Bilateral;  GENERAL COMBINED WITH REGIONAL FOR POST OP PAIN    IR PARACENTESIS  08/19/2016   IR PARACENTESIS  08/26/2016   IR PARACENTESIS  09/02/2016   IR PARACENTESIS  09/09/2016   IR PARACENTESIS  09/16/2016   IR PARACENTESIS  10/03/2016   IR PARACENTESIS  10/11/2016   IR RADIOLOGIST EVAL & MGMT  09/29/2016   IR RADIOLOGIST EVAL & MGMT  12/21/2016   IR RADIOLOGIST EVAL & MGMT  01/23/2018   IR RADIOLOGIST EVAL & MGMT  02/19/2019   IR RADIOLOGIST EVAL &  MGMT  03/30/2020   IR RADIOLOGIST EVAL & MGMT  03/17/2021   IR TIPS  10/11/2016   RADIOLOGY WITH ANESTHESIA N/A 10/11/2016   Procedure: TIPS;  Surgeon: Arne Cleveland, MD;  Location: [facility];  Service: Radiology;  Laterality: N/A;   UMBILICAL HERNIA REPAIR N/A 10/16/2016   Procedure: HERNIA REPAIR UMBILICAL ADULT;  Surgeon: Kinsinger, Arta Bruce, MD;  Location: [facility];  Service: General;  Laterality: N/A;     Current Meds  Medication Sig   allopurinol (ZYLOPRIM) 100 MG tablet TAKE 1/2 TABLET BY MOUTH EVERY DAY   amLODipine (NORVASC) 5 MG tablet Take 1 tablet (5 mg total) by mouth daily. Please call to schedule an appointment for further refills. 1st attempt   Cholecalciferol (VITAMIN D-3) 5000 units TABS Take 5,000 Units daily by mouth.    montelukast (SINGULAIR) 10 MG tablet Take 1 tablet (10 mg total) by mouth at bedtime.   spironolactone (ALDACTONE) 25 MG tablet Take 1.5 tablets (37.5 mg total) by mouth at bedtime. Schedule an appointment for further refills, 2ND ATTEMPT   WIXELA INHUB 500-50 MCG/ACT AEPB INHALE 1 PUFF BY MOUTH TWICE A DAY   [DISCONTINUED] bisoprolol (ZEBETA) 5 MG tablet Take 1&1/2 tablets ( 7.5 mg ) daily. Schedule an appointment for further refills. KEEP UPCOMING APPOINTMENT FOR FUTURE REFILLS.     Allergies:   Patient has no known allergies.   Social History   Tobacco Use   Smoking status: Never   Smokeless tobacco: Current    Types: Snuff, Chew   Tobacco comments:    Started chewing tobacco at 81 years old  Vaping Use   Vaping Use: Never used  Substance Use Topics   Alcohol use: No    Comment: quit drinking "over 2 yr ago" (10/11/2016)   Drug use: No     Family Hx: The patient's family history includes Asthma in his brother, brother, sister, and son; Heart disease in his sister; Hypertension in his brother and brother; Other in his father and mother.  ROS:   Please see the history of present illness.    All other systems are reviewed and are  negative.  Prior CV studies:   The following studies were reviewed today: AFib clinic notes  Labs/Other Tests and Data Reviewed:    ECHO 10/05/2020: 1. Left ventricular ejection fraction, by estimation, is 55 to 60%. The left ventricle has normal function. The left ventricle has no regional wall motion abnormalities. Left ventricular diastolic function could not be evaluated.   2. Right ventricular systolic function is normal. The right ventricular size is normal. There is moderately elevated pulmonary artery systolic pressure. The estimated right ventricular systolic pressure is 86.7 mmHg.   3. Left atrial size was mildly dilated.   4. The mitral valve is degenerative. Mild mitral valve regurgitation. No evidence of mitral stenosis.   5. The aortic  valve is tricuspid. Aortic valve regurgitation is not visualized. No aortic stenosis is present.   6. The inferior vena cava is dilated in size with >50% respiratory variability, suggesting right atrial pressure of 8 mmHg.  EKG: ECG ordered today shows atrial flutter with variable AV block and slow ventricular response of 56 bpm, voltage criteria for LVH, otherwise normal tracing, QTc 424 ms  Recent Labs: 12/15/2021: ALT 43; Hemoglobin 11.0; Platelets 131 01/31/2022: BUN 32; Creatinine, Ser 2.11; Potassium 4.7; Sodium 138   03/28/2022 Potassium 5.3, creatinine 1.97   Recent Lipid Panel Lab Results  Component Value Date/Time   CHOL 143 10/04/2020 06:29 PM   CHOL 110 12/19/2019 10:25 AM   TRIG 23 10/04/2020 06:29 PM   HDL 60 10/04/2020 06:29 PM   HDL 56 12/19/2019 10:25 AM   CHOLHDL 2.4 10/04/2020 06:29 PM   LDLCALC 78 10/04/2020 06:29 PM   LDLCALC 43 12/19/2019 10:25 AM    Wt Readings from Last 3 Encounters:  04/27/22 69.7 kg  01/31/22 69.7 kg  12/15/21 77.2 kg     Objective:    Vital Signs:  BP (!) 132/59   Pulse (!) 56   Ht 5' 10.5" (1.791 m)   Wt 69.7 kg   SpO2 99%   BMI 21.73 kg/m     General: Alert, oriented x3, no  distress, appears leaner than I remember him, but still at an acceptable weight Head: no evidence of trauma, PERRL, EOMI, no exophtalmos or lid lag, no myxedema, no xanthelasma; normal ears, nose and oropharynx Neck: normal jugular venous pulsations and no hepatojugular reflux; brisk carotid pulses without delay and no carotid bruits Chest: clear to auscultation, no signs of consolidation by percussion or palpation, normal fremitus, symmetrical and full respiratory excursions Cardiovascular: normal position and quality of the apical impulse, irregular rhythm, normal first and second heart sounds, no murmurs, rubs or gallops Abdomen: no tenderness or distention, no masses by palpation, no abnormal pulsatility or arterial bruits, normal bowel sounds, no hepatosplenomegaly Extremities: no clubbing, cyanosis or edema; 2+ radial, ulnar and brachial pulses bilaterally; 2+ right femoral, posterior tibial and dorsalis pedis pulses; 2+ left femoral, posterior tibial and dorsalis pedis pulses; no subclavian or femoral bruits Neurological: grossly nonfocal Psych: Normal mood and affect    ASSESSMENT & PLAN:    1. Typical atrial flutter ([place])   2. Chronic diastolic congestive heart failure ([place])   3. PAH (pulmonary artery hypertension) ([place])   4. Alcoholic cirrhosis of liver without ascites (H[facility])   5. Stage 3b chronic kidney disease ([facility])        A Flutter: Asymptomatic.  Possibly a little excessively rate controlled.  Decrease bisoprolol to 5 mg daily.  We have not recommended anticoagulation, due to advanced chronic liver disease.  Prothrombin time is mildly elevated at 15.4 (INR 1.2) spontaneously. CHADSVasc 2-3 (age; CHF was rate related), embolic risk is relatively low especially with flutter. CHF: Clinically euvolemic without edema or ascites on spironolactone.  Ever since his flutter rates have been well controlled he has not had any problems with heart failure.  Monitor renal function  carefully. PAH:  by most recent echo.  Asymptomatic.  Suspect this is related to portal hypertension/hepatic shunting. Cirrhosis: No evidence of hemodynamic decompensation and relatively subtle signs of parenchymal insufficiency (albumin has improved to 4.3, bilirubin is lower at 1.4, INR 1.2). Has been abstinent from alcohol. CKD 3B: Recent creatinine 1.97 last month, improved from earlier in the year when it was high as 2.6 (2022  baseline is 1.5-1.9). GFR right around 30. K at the upper limit of normal.  It looks like he is taking spironolactone again, not sure when this was restarted, but recent labs show acceptable values.  Would consider decreasing the spironolactone as well, but wait to see the impact of decreasing bisoprolol on his blood pressure first.   Patient Instructions  Medication Instructions:   TAKE bisoprolol '5mg'$  once daily   *If you need a refill on your cardiac medications before your next appointment, please call your pharmacy*     Follow-Up: At [facility], you and your health needs are our priority.  As part of our continuing mission to provide you with exceptional heart care, we have created designated Provider Care Teams.  These Care Teams include your primary Cardiologist (physician) and Advanced Practice Providers (APPs -  Physician Assistants and Nurse Practitioners) who all work together to provide you with the care you need, when you need it.  We recommend signing up for the patient portal called "MyChart".  Sign up information is provided on this After Visit Summary.  MyChart is used to connect with patients for Virtual Visits (Telemedicine).  Patients are able to view lab/test results, encounter notes, upcoming appointments, etc.  Non-urgent messages can be sent to your provider as well.   To learn more about what you can do with MyChart, go to [email or web address].    Your next appointment:   12 month(s)  The format for your next appointment:    In Person  Provider:   Sanda Klein, MD        Signed, Sanda Klein, MD  04/27/2022 10:33 AM    [place]

## 2022-04-27 NOTE — Patient Instructions (Signed)
Medication Instructions:   TAKE bisoprolol '5mg'$  once daily   *If you need a refill on your cardiac medications before your next appointment, please call your pharmacy*     Follow-Up: At [facility], you and your health needs are our priority.  As part of our continuing mission to provide you with exceptional heart care, we have created designated Provider Care Teams.  These Care Teams include your primary Cardiologist (physician) and Advanced Practice Providers (APPs -  Physician Assistants and Nurse Practitioners) who all work together to provide you with the care you need, when you need it.  We recommend signing up for the patient portal called "MyChart".  Sign up information is provided on this After Visit Summary.  MyChart is used to connect with patients for Virtual Visits (Telemedicine).  Patients are able to view lab/test results, encounter notes, upcoming appointments, etc.  Non-urgent messages can be sent to your provider as well.   To learn more about what you can do with MyChart, go to [email or web address].    Your next appointment:   12 month(s)  The format for your next appointment:   In Person  Provider:   Sanda Klein, MD

## 2022-04-28 ENCOUNTER — Other Ambulatory Visit: Payer: Self-pay | Admitting: Cardiovascular Disease

## 2022-04-28 DIAGNOSIS — I10 Essential (primary) hypertension: Secondary | ICD-10-CM

## 2022-05-05 ENCOUNTER — Other Ambulatory Visit: Payer: Medicare Other

## 2022-05-12 ENCOUNTER — Other Ambulatory Visit: Payer: Self-pay | Admitting: Cardiovascular Disease

## 2022-05-12 DIAGNOSIS — I10 Essential (primary) hypertension: Secondary | ICD-10-CM

## 2022-05-20 ENCOUNTER — Other Ambulatory Visit: Payer: Medicare Other

## 2022-05-23 ENCOUNTER — Telehealth: Payer: Medicare Other

## 2022-05-31 ENCOUNTER — Other Ambulatory Visit: Payer: Self-pay | Admitting: Family Medicine

## 2022-06-01 ENCOUNTER — Ambulatory Visit
Admission: RE | Admit: 2022-06-01 | Discharge: 2022-06-01 | Disposition: A | Discharge status: Home / Self Care | Payer: 59 | Source: Ambulatory Visit | Attending: Interventional Radiology | Admitting: Interventional Radiology

## 2022-06-01 DIAGNOSIS — Z95828 Presence of other vascular implants and grafts: Secondary | ICD-10-CM

## 2022-06-15 ENCOUNTER — Ambulatory Visit
Admission: RE | Admit: 2022-06-15 | Discharge: 2022-06-15 | Disposition: A | Discharge status: Home / Self Care | Payer: Medicare Other | Source: Ambulatory Visit | Attending: Interventional Radiology | Admitting: Interventional Radiology

## 2022-06-15 DIAGNOSIS — Z95828 Presence of other vascular implants and grafts: Secondary | ICD-10-CM

## 2022-06-15 NOTE — Progress Notes (Signed)
Patient ID: Donald Moreno, male   DOB: 1940-10-05, 82 y.o.   MRN: [id number]       Chief Complaint: Patient was consulted remotely today (TeleHealth) for  follow up TIPS  at the request of Nakeisha Greenhouse.    Referring Physician(s):  Arta Silence MD   History of Present Illness: Donald Moreno is a 82 y.o. male  with a long history of cirrhosis and portal venous hypertension.  He had TIPS creation 09/30/2016 because of recurrent large volume abdominal ascites requiring multiple ultrasound-guided paracentesis procedures.   He has had no need for paracentesis since his procedure.  He has no abdominal distention.  He maintains his weight using Lasix for fluid balance.  His appetite remains good.  His energy is good.  He and some family are headed to [place] today for a family funeral. He elected to discontinue his lactulose in September.  No evidence of hepatic encephalopathy. He maintains  his signature  positive demeanor and gratitude for life.  Past Medical History:  Diagnosis Date   Acute congestive heart failure ([place])    Arthritis    "all over"   Asthma    Blood in stool 04/11/2017   Chronic kidney disease    "I see a kidney dr @ [facility]" (10/11/2016)   Chronic lower back pain    "turned a truck over a long time ago" (10/11/2016)   Dysrhythmia    bradycardia with occassional junctional rhythm   ETOH abuse    Gout    Hypertension    Inguinal hernia 10/28/2014   S/P inguinal hernia repair with 4L as ascites removed 03/30/17   Liver cirrhosis ([place]) 2015   Macrocytosis    Pneumonia    "long long time ago" (10/11/2016)   Spontaneous bacterial peritonitis ([place]) 07/01/2015    Past Surgical History:  Procedure Laterality Date   INGUINAL HERNIA REPAIR Bilateral 03/30/2017   Procedure: OPEN BILATERAL INGUINAL HERNIA REPAIR WITH MESH;  Surgeon: Kinsinger, Arta Bruce, MD;  [facility];  Service: General;  Laterality: Bilateral;  GENERAL COMBINED WITH REGIONAL FOR POST OP PAIN     INSERTION OF MESH N/A 10/16/2016   Procedure: INSERTION OF MESH;  Surgeon: Kinsinger, Arta Bruce, MD;  Location: [facility];  Service: General;  Laterality: N/A;   INSERTION OF MESH Bilateral 03/30/2017   Procedure: INSERTION OF MESH;  Surgeon: Kieth Brightly Arta Bruce, MD;  [facility];  Service: General;  Laterality: Bilateral;  GENERAL COMBINED WITH REGIONAL FOR POST OP PAIN    IR PARACENTESIS  08/19/2016   IR PARACENTESIS  08/26/2016   IR PARACENTESIS  09/02/2016   IR PARACENTESIS  09/09/2016   IR PARACENTESIS  09/16/2016   IR PARACENTESIS  10/03/2016   IR PARACENTESIS  10/11/2016   IR RADIOLOGIST EVAL & MGMT  09/29/2016   IR RADIOLOGIST EVAL & MGMT  12/21/2016   IR RADIOLOGIST EVAL & MGMT  01/23/2018   IR RADIOLOGIST EVAL & MGMT  02/19/2019   IR RADIOLOGIST EVAL & MGMT  03/30/2020   IR RADIOLOGIST EVAL & MGMT  03/17/2021   IR TIPS  10/11/2016   RADIOLOGY WITH ANESTHESIA N/A 10/11/2016   Procedure: TIPS;  Surgeon: Arne Cleveland, MD;  Location: [facility];  Service: Radiology;  Laterality: N/A;   UMBILICAL HERNIA REPAIR N/A 10/16/2016   Procedure: HERNIA REPAIR UMBILICAL ADULT;  Surgeon: Kinsinger, Arta Bruce, MD;  Location: [facility];  Service: General;  Laterality: N/A;    Allergies: Patient has no known allergies.  Medications: Prior to Admission  medications   Medication Sig Start Date End Date Taking? Authorizing Provider  albuterol (PROAIR HFA) 108 (90 Base) MCG/ACT inhaler Inhale 2 puffs into the lungs every 4 (four) hours as needed for wheezing. Patient not taking: Reported on 04/27/2022 05/24/18   Shirley, [place], DO  allopurinol (ZYLOPRIM) 100 MG tablet TAKE 1/2 TABLET BY MOUTH EVERY DAY 12/10/21   Lilland, Alana, DO  amLODipine (NORVASC) 5 MG tablet Take 1 tablet (5 mg total) by mouth daily. 05/12/22   Croitoru, Mihai, MD  bisoprolol (ZEBETA) 5 MG tablet Take 1 tablet by mouth daily 04/27/22   Croitoru, Mihai, MD  Cholecalciferol (VITAMIN D-3) 5000 units TABS Take 5,000 Units daily by mouth.      [provider]  lidocaine (LIDODERM) 5 % Place 1 patch onto the skin daily. Remove & Discard patch within 12 hours or as directed by MD Patient not taking: Reported on 04/27/2022 10/27/21   Lilland, Alana, DO  montelukast (SINGULAIR) 10 MG tablet Take 1 tablet (10 mg total) by mouth at bedtime. 07/19/21   Sharion Settler, DO  spironolactone (ALDACTONE) 25 MG tablet Take 1.5 tablets (37.5 mg total) by mouth at bedtime. Schedule an appointment for further refills, 2ND ATTEMPT 03/25/22   Croitoru, Mihai, MD  traMADol (ULTRAM) 50 MG tablet TAKE 1 TABLET BY MOUTH EVERY DAY AT BEDTIME AS NEEDED 06/01/22   Lilland, Alana, DO  WIXELA INHUB 500-50 MCG/ACT AEPB INHALE 1 PUFF BY MOUTH TWICE A DAY 02/18/22   Lilland, Alana, DO  furosemide (LASIX) 40 MG tablet TAKE 1 TAB 3 TIMES A DAY FOR WEIGHT >168LB 2 TIMES A DAY FOR WEIGHT 163-168LB ONCE DAILY FOR WEIGHT <163LB 12/24/18 07/20/19  Shirley, [place], DO     Family History  Problem Relation Age of Onset   Other Mother        died when pt was only 76 - unknown cause.   Other Father        deceased.   Asthma Sister    Heart disease Sister        multiple stents.   Asthma Brother    Hypertension Brother    Asthma Son    Asthma Brother    Hypertension Brother     Social History   Socioeconomic History   Marital status: Divorced    Spouse name: Not on file   Number of children: 5   Years of education: 10   Highest education level: Not on file  Occupational History   Occupation: Retired- truck Geophysicist/field seismologist  Tobacco Use   Smoking status: Never   Smokeless tobacco: Current    Types: Snuff, Chew   Tobacco comments:    Started chewing tobacco at 82 years old  Vaping Use   Vaping Use: Never used  Substance and Sexual Activity   Alcohol use: No    Comment: quit drinking "over 2 yr ago" (10/11/2016)   Drug use: No   Sexual activity: Not Currently  Other Topics Concern   Not on file  Social History Narrative   Lives with nephew and niece in  [employer] home.     Tobacco: snuff all day (can last two days. Never smoked.       Hobbies: cooking, sleeping, going to store   Pets:  Dog, Bean   Social Determinants of Health   Financial Resource Strain: Not on file  Food Insecurity: Not on file  Transportation Needs: Not on file  Physical Activity: Not on file  Stress: Not on file  Social Connections:  Not on file    ECOG Status: 1 - Symptomatic but completely ambulatory  Review of Systems  Review of Systems: A 12 point ROS discussed and pertinent positives are indicated in the HPI above.  All other systems are negative.    Physical Exam No direct physical exam was performed (except for noted visual exam findings with Video Visits).     Vital Signs: There were no vitals taken for this visit.  Imaging: US ABDOMINAL PELVIC ART/VENT FLOW DOPPLER  Result Date: 06/03/2022 CLINICAL DATA:  History of cirrhosis complicated by recurrent ascites. Patient underwent tips creation 10/11/2016. Routine surveillance imaging. EXAM: DUPLEX ULTRASOUND OF LIVER AND TIPS SHUNT TECHNIQUE: Color and duplex Doppler ultrasound was performed to evaluate the hepatic in-flow and out-flow vessels. COMPARISON:  None Available. Prior TIPS ultrasound 03/17/2021 FINDINGS: Portal Vein Velocities Main:  42 cm/sec Right:  48 cm/sec Left:  30 cm/sec TIPS Stent Velocities Proximal:  176 cm/sec Mid:  193 Distal:  122 cm/sec IVC: Present and patent with normal respiratory phasicity. Hepatic Vein Velocities Right:  158 cm/sec Mid:  43 cm/sec Left:  48 cm/sec Hepatic Artery: 103 centimeters/second No significant ascites. Echogenic and heterogeneous liver parenchyma consistent with known cirrhosis. The gallbladder is contracted and contains multiple echogenic foci consistent with stones. IMPRESSION: Widely patent TIPS. Hepatic cirrhosis without evidence of mass. No significant ascites. Electronically Signed   By: Jacqulynn Cadet M.D.   On: 06/03/2022 05:44     Labs:  CBC: Recent Labs    12/15/21 1155  WBC 5.5  HGB 11.0*  HCT 30.8*  PLT 131*    COAGS: No results for input(s): "INR", "APTT" in the last 8760 hours.  BMP: Recent Labs    11/29/21 0905 12/03/21 1612 12/15/21 1155 01/31/22 1541  NA 135 136 141 138  K 5.8* 5.6* 5.0 4.7  CL 105 106 108* 107*  CO2 16* 18* 18* 15*  GLUCOSE 93 77 90 87  BUN 44* 48* 29* 32*  CALCIUM 9.5 9.8 9.8 9.2  CREATININE 2.59* 2.37* 2.25* 2.11*    LIVER FUNCTION TESTS: Recent Labs    12/15/21 1155  BILITOT 1.1  AST 73*  ALT 43  ALKPHOS 183*  PROT 7.6  ALBUMIN 4.3    TUMOR MARKERS: No results for input(s): "AFPTM", "CEA", "CA199", "CHROMGRNA" in the last 8760 hours.  Assessment and Plan:  My impression is that Donald Moreno is doing great post TIPS creation  almost 6 years ago.  This has obviated his need for any recurrent paracentesis.  He is not having any problems with hepatic encephalopathy despite discontinuing his lactulose.  The TIPS looks great on ultrasound.  No new liver lesions evident, just a stable small cyst. I would anticipate continued success with regards to the TIPS.  He will need continued surveillance because of his underlying cirrhosis of course.   We reviewed together the recent ultrasound findings, and the expected durable function of the TIPS.  He has a good attitude about life and looks forward to every day.  He knows to call with any questions or problems.   We will plan to see him back in about a year for his usual annual surveillance ultrasound.     Thank you for this interesting consult.  I greatly enjoyed meeting Donald Moreno and look forward to participating in their care.  A copy of this report was sent to the requesting provider on this date.  Electronically Signed: Rickard Rhymes 06/15/2022, 8:38 AM   I spent a  total of    15 Minutes in remote  clinical consultation, greater than 50% of which was counseling/coordinating care for TIPS  surveillance.    Visit type: Audio only (telephone). Audio (no video) only due to patient's lack of internet/smartphone capability. Alternative for in-person consultation at [facility], [address], [place], [place].  This format is felt to be most appropriate for this patient at this time.  All issues noted in this document were discussed and addressed.

## 2022-06-16 ENCOUNTER — Other Ambulatory Visit: Payer: Self-pay | Admitting: Family Medicine

## 2022-06-16 DIAGNOSIS — M109 Gout, unspecified: Secondary | ICD-10-CM

## 2022-06-23 ENCOUNTER — Ambulatory Visit: Payer: Medicare Other | Admitting: Podiatry

## 2022-06-23 ENCOUNTER — Encounter (HOSPITAL_COMMUNITY): Payer: Self-pay | Admitting: *Deleted

## 2022-07-04 ENCOUNTER — Other Ambulatory Visit: Payer: Self-pay | Admitting: Gastroenterology

## 2022-07-04 DIAGNOSIS — K7460 Unspecified cirrhosis of liver: Secondary | ICD-10-CM

## 2022-07-12 ENCOUNTER — Other Ambulatory Visit: Payer: Self-pay | Admitting: Cardiovascular Disease

## 2022-07-12 ENCOUNTER — Telehealth: Payer: Self-pay | Admitting: Cardiovascular Disease

## 2022-07-12 ENCOUNTER — Other Ambulatory Visit: Payer: Self-pay

## 2022-07-12 DIAGNOSIS — I10 Essential (primary) hypertension: Secondary | ICD-10-CM

## 2022-07-12 NOTE — Telephone Encounter (Signed)
*  STAT* If patient is at the pharmacy, call can be transferred to refill team.   1. Which medications need to be refilled? (please list name of each medication and dose if known) Losartan 100 MG once daily  2. Which pharmacy/location (including street and city if local pharmacy) is medication to be sent to? [id number]- [place], [place] - [address] AT [address]. Do they need a 30 day or 90 day supply?  90 day supply  Patient is completely out of medication.

## 2022-07-12 NOTE — Telephone Encounter (Signed)
I do not think he is supposed to be taking that medication any longer.  Please take losartan off his list.

## 2022-07-15 ENCOUNTER — Other Ambulatory Visit: Payer: Self-pay | Admitting: Family Medicine

## 2022-07-17 ENCOUNTER — Other Ambulatory Visit: Payer: Self-pay | Admitting: Family Medicine

## 2022-07-22 ENCOUNTER — Telehealth: Payer: Self-pay

## 2022-07-22 NOTE — Telephone Encounter (Signed)
Patient Donald Moreno on nurse line requesting a refill on Losartan.   Per chart review it appears this medication was discontinued by his Cardiologist.   Attempted to call patient back to discuss. However, no answer option for VM.

## 2022-07-25 ENCOUNTER — Telehealth: Payer: Self-pay | Admitting: Family Medicine

## 2022-07-25 ENCOUNTER — Ambulatory Visit: Payer: Medicare Other | Admitting: Podiatry

## 2022-07-25 ENCOUNTER — Ambulatory Visit (INDEPENDENT_AMBULATORY_CARE_PROVIDER_SITE_OTHER): Payer: 59 | Admitting: Podiatry

## 2022-07-25 DIAGNOSIS — M79674 Pain in right toe(s): Secondary | ICD-10-CM | POA: Diagnosis not present

## 2022-07-25 DIAGNOSIS — B351 Tinea unguium: Secondary | ICD-10-CM | POA: Diagnosis not present

## 2022-07-25 DIAGNOSIS — M79675 Pain in left toe(s): Secondary | ICD-10-CM

## 2022-07-25 NOTE — Telephone Encounter (Signed)
Patient came in stating that he needs a refill for his '100mg'$  Losartin, he only has one left.

## 2022-07-25 NOTE — Progress Notes (Signed)
   Chief Complaint  Patient presents with   Nail Problem    Routine foot care, nail and callus trim     SUBJECTIVE Patient presents to office today complaining of elongated, thickened nails that cause pain while ambulating in shoes.  Patient is unable to trim their own nails.  Patient also has symptomatic calluses that he is requesting to have debrided as well.  Patient is here for further evaluation and treatment.  Past Medical History:  Diagnosis Date   Acute congestive heart failure ([place])    Arthritis    "all over"   Asthma    Blood in stool 04/11/2017   Chronic kidney disease    "I see a kidney dr @ [facility]" (10/11/2016)   Chronic lower back pain    "turned a truck over a long time ago" (10/11/2016)   Dysrhythmia    bradycardia with occassional junctional rhythm   ETOH abuse    Gout    Hypertension    Inguinal hernia 10/28/2014   S/P inguinal hernia repair with 4L as ascites removed 03/30/17   Liver cirrhosis ([place]) 2015   Macrocytosis    Pneumonia    "long long time ago" (10/11/2016)   Spontaneous bacterial peritonitis ([place]) 07/01/2015    No Known Allergies   OBJECTIVE General Patient is awake, alert, and oriented x 3 and in no acute distress. Derm Skin is dry and supple bilateral. Negative open lesions or macerations. Remaining integument unremarkable. Nails are tender, long, thickened and dystrophic with subungual debris, consistent with onychomycosis, 1-5 bilateral. No signs of infection noted.  Hyperkeratotic calluses noted to the plantar aspect of the bilateral forefoot with a central nucleated core Vasc  DP and PT pedal pulses palpable bilaterally. Temperature gradient within normal limits.  Neuro Epicritic and protective threshold sensation grossly intact bilaterally.  Musculoskeletal Exam No symptomatic pedal deformities noted bilateral. Muscular strength within normal limits.  ASSESSMENT 1.  Pain due to onychomycosis of toenails both 2.  Symptomatic  callus plantar aspect of the bilateral foot  PLAN OF CARE 1. Patient evaluated today.  2. Instructed to maintain good pedal hygiene and foot care.  3. Mechanical debridement of nails 1-5 bilaterally performed using a nail nipper. Filed with dremel without incident.  4.  Excisional debridement of the hyperkeratotic callus was performed today with a 312 scalpel without incident or bleeding.  Patient felt relief.  Courtesy for the patient  5.  Return to clinic in 3 mos.    Edrick Kins, DPM [facility]  Dr. Edrick Kins, DPM    [address], [place] [address]                Office [phone]  Fax [phone]

## 2022-07-27 ENCOUNTER — Other Ambulatory Visit: Payer: Self-pay | Admitting: Family Medicine

## 2022-07-27 DIAGNOSIS — M109 Gout, unspecified: Secondary | ICD-10-CM

## 2022-07-27 DIAGNOSIS — I10 Essential (primary) hypertension: Secondary | ICD-10-CM

## 2022-07-27 NOTE — Progress Notes (Signed)
Losartan is not on patient medication list, unclear of what medication is needed.

## 2022-07-28 ENCOUNTER — Ambulatory Visit
Admission: RE | Admit: 2022-07-28 | Discharge: 2022-07-28 | Disposition: A | Discharge status: Home / Self Care | Payer: 59 | Source: Ambulatory Visit | Attending: Gastroenterology | Admitting: Gastroenterology

## 2022-07-28 DIAGNOSIS — K7460 Unspecified cirrhosis of liver: Secondary | ICD-10-CM

## 2022-07-28 NOTE — Telephone Encounter (Signed)
Attempted to reach patient. No answer. Unable to LVM phone just rang. Will try again. Wanted to make appt for medication review. Salvatore Marvel, CMA

## 2022-07-29 NOTE — Telephone Encounter (Signed)
Spoke with patient. Made appt for 3/20 at 9:10 to go over medications. Salvatore Marvel, CMA

## 2022-08-02 NOTE — Progress Notes (Deleted)
    SUBJECTIVE:   CHIEF COMPLAINT / HPI:   *** HYPERTENSION, BENIGN SYSTEMIC BP well controlled in the office today at 133/62.  Currently on spironolactone 50 mg daily, amlodipine 5 mg daily, bisoprolol 5 mg. - Continue current regimen - BMP to evaluate potassium   Avascular necrosis of bone of left hip (HCC) Persistent pain, slightly improved with the tramadol dosing from before. - Refill of tramadol 50 mg nightly provided   CKD (chronic kidney disease) stage 4, GFR 15-29 ml/min (HCC) Awaiting follow-up with [facility]. - BMP today  PERTINENT  PMH / PSH: ***  OBJECTIVE:   There were no vitals taken for this visit.  ***  ASSESSMENT/PLAN:   No problem-specific Assessment & Plan notes found for this encounter.     Ezequiel Essex, MD [place]

## 2022-08-03 ENCOUNTER — Ambulatory Visit: Payer: 59 | Admitting: Family Medicine

## 2022-08-03 DIAGNOSIS — N184 Chronic kidney disease, stage 4 (severe): Secondary | ICD-10-CM

## 2022-08-03 DIAGNOSIS — Z23 Encounter for immunization: Secondary | ICD-10-CM

## 2022-08-11 ENCOUNTER — Encounter: Payer: Self-pay | Admitting: Family Medicine

## 2022-08-11 ENCOUNTER — Ambulatory Visit (INDEPENDENT_AMBULATORY_CARE_PROVIDER_SITE_OTHER): Payer: 59 | Admitting: Family Medicine

## 2022-08-11 VITALS — BP 146/58 | HR 58 | Ht 70.5 in | Wt 165.4 lb

## 2022-08-11 DIAGNOSIS — I10 Essential (primary) hypertension: Secondary | ICD-10-CM

## 2022-08-11 MED ORDER — LOSARTAN POTASSIUM 100 MG PO TABS: 100.0000 mg | ORAL_TABLET | Freq: Every day | ORAL | 1 refills | Status: DC

## 2022-08-11 NOTE — Patient Instructions (Addendum)
It was nice seeing you today!  I have refilled your losartan.  Come back in 2 weeks for blood pressure check and labs.  Stay well, Zola Button, MD [place] [phone]  --  Make sure to check out at the front desk before you leave today.  Please arrive at least 15 minutes prior to your scheduled appointments.  If you had blood work today, I will send you a MyChart message or a letter if results are normal. Otherwise, I will give you a call.  If you had a referral placed, they will call you to set up an appointment. Please give [place] a call if you don't hear back in the next 2 weeks.  If you need additional refills before your next appointment, please call your pharmacy first.

## 2022-08-11 NOTE — Progress Notes (Signed)
    SUBJECTIVE:   CHIEF COMPLAINT / HPI:  Chief Complaint  Patient presents with   Medication Management   Medication Refill    Here for follow-up for hypertension. Medications: Amlodipine 5 mg, bisoprolol 5 mg. Last visit with cardiology 04/2022, bisoprolol was decreased to 5 mg daily.  He has brought his medications with him today.  He reports he has still been taking the losartan 100 mg. He ran out of the medication 2 weeks ago. On further chart review, it appears that this was discontinued by the PCP several months ago due to hyperkalemia.  He was supposed to hold this medication, but patient reports he never heard anything about this and has still been taking the medication. Does not check BP at home. Walking daily.  PERTINENT  PMH / PSH: Alcoholic cirrhosis, CKD stage IV followed by nephrology, HFpEF, HTN, atrial flutter followed by cardiology  Patient Care Team: [name], DO as PCP - General (Family Medicine) Croitoru, Mihai, MD as PCP - Cardiology (Cardiology) Arta Silence, MD as Consulting Physician (Gastroenterology)   OBJECTIVE:   BP (!) 146/58   Pulse (!) 58   Ht 5' 10.5" (1.791 m)   Wt 165 lb 6.4 oz (75 kg)   SpO2 100%   BMI 23.40 kg/m   Physical Exam Constitutional:      General: He is not in acute distress. Cardiovascular:     Rate and Rhythm: Normal rate.     Comments: 2/6 systolic murmur Pulmonary:     Effort: Pulmonary effort is normal. No respiratory distress.     Comments: Mild diffuse expiratory wheezes Neurological:     Mental Status: He is alert.         {Show previous vital signs (Q000111Q  Last metabolic panel Lab Results  Component Value Date   GLUCOSE 87 01/31/2022   NA 138 01/31/2022   K 4.7 01/31/2022   CL 107 (H) 01/31/2022   CO2 15 (L) 01/31/2022   BUN 32 (H) 01/31/2022   CREATININE 2.11 (H) 01/31/2022   EGFR 31 (L) 01/31/2022   CALCIUM 9.2 01/31/2022   PHOS 3.7 04/13/2017   PROT 7.6 12/15/2021    ALBUMIN 4.3 12/15/2021   LABGLOB 3.3 12/15/2021   AGRATIO 1.3 12/15/2021   BILITOT 1.1 12/15/2021   ALKPHOS 183 (H) 12/15/2021   AST 73 (H) 12/15/2021   ALT 43 12/15/2021   ANIONGAP 8 04/18/2021      ASSESSMENT/PLAN:   Problem List Items Addressed This Visit       Cardiovascular and Mediastinum   HYPERTENSION, BENIGN SYSTEMIC - Primary (Chronic)    Uncontrolled in the office.  Due to miscommunication, patient has still been taking the losartan 100 mg and recently ran out 2 weeks ago.  He had issues with hyperkalemia and looks like this normalized even when he was still on the losartan so I think it is appropriate to refill this medication.  Will plan to repeat metabolic panel today and in 2 weeks. - restart losartan 100 mg - continue amlodipine 5 mg, bisoprolol 5 mg      Relevant Medications   losartan (COZAAR) 100 MG tablet   Other Relevant Orders   Basic Metabolic Panel       Return in about 2 weeks (around 08/25/2022) for f/u HTN.   Zola Button, MD [place]

## 2022-08-11 NOTE — Assessment & Plan Note (Signed)
Uncontrolled in the office.  Due to miscommunication, patient has still been taking the losartan 100 mg and recently ran out 2 weeks ago.  He had issues with hyperkalemia and looks like this normalized even when he was still on the losartan so I think it is appropriate to refill this medication.  Will plan to repeat metabolic panel today and in 2 weeks. - restart losartan 100 mg - continue amlodipine 5 mg, bisoprolol 5 mg

## 2022-08-12 LAB — BASIC METABOLIC PANEL
BUN/Creatinine Ratio: 17 (ref 10–24)
BUN: 35 mg/dL — ABNORMAL HIGH (ref 8–27)
CO2: 16 mmol/L — ABNORMAL LOW (ref 20–29)
Calcium: 9.6 mg/dL (ref 8.6–10.2)
Chloride: 110 mmol/L — ABNORMAL HIGH (ref 96–106)
Creatinine, Ser: 2.05 mg/dL — ABNORMAL HIGH (ref 0.76–1.27)
Glucose: 91 mg/dL (ref 70–99)
Potassium: 5.5 mmol/L — ABNORMAL HIGH (ref 3.5–5.2)
Sodium: 139 mmol/L (ref 134–144)
eGFR: 32 mL/min/{1.73_m2} — ABNORMAL LOW (ref >59)

## 2022-08-14 ENCOUNTER — Telehealth: Payer: Self-pay | Admitting: Family Medicine

## 2022-08-14 NOTE — Telephone Encounter (Signed)
I called patient with his lab results.  Notable for hyperkalemia.  In light of this, I told him to stop taking his losartan for the time being.  He expressed understanding and verified the medication he is to stop taking for now is losartan.  Will forward to provider seeing him on 4/11.  If still hyperkalemic, he may need to decrease his spironolactone.

## 2022-08-17 ENCOUNTER — Other Ambulatory Visit: Payer: Self-pay | Admitting: Family Medicine

## 2022-08-17 DIAGNOSIS — J45909 Unspecified asthma, uncomplicated: Secondary | ICD-10-CM

## 2022-08-25 ENCOUNTER — Encounter: Payer: Self-pay | Admitting: Family Medicine

## 2022-08-25 ENCOUNTER — Ambulatory Visit (INDEPENDENT_AMBULATORY_CARE_PROVIDER_SITE_OTHER): Payer: 59 | Admitting: Family Medicine

## 2022-08-25 VITALS — BP 132/60 | HR 70 | Ht 70.0 in | Wt 163.5 lb

## 2022-08-25 DIAGNOSIS — I10 Essential (primary) hypertension: Secondary | ICD-10-CM

## 2022-08-25 DIAGNOSIS — Z23 Encounter for immunization: Secondary | ICD-10-CM | POA: Diagnosis not present

## 2022-08-25 NOTE — Patient Instructions (Signed)
It was great seeing you today!  Today we discussed your blood pressure which looks great today! Please continue to take your amlodipine, bisoprolol and spironolactone daily. Please do not take your losartan.   We will get blood work to assess your kidney function and electrolytes.   Please follow up at your next scheduled appointment in 2 weeks, if anything arises between now and then, please don't hesitate to contact our office.   Thank you for allowing [place] to be a part of your medical care!  Thank you, Dr. Robyne Peers  Also a reminder of our clinic's no-show policy. Please make sure to arrive at least 15 minutes prior to your scheduled appointment time. Please try to cancel before 24 hours if you are not able to make it. If you no-show for 2 appointments then you will be receiving a warning letter. If you no-show after 3 visits, then you may be at risk of being dismissed from our clinic. This is to ensure that everyone is able to be seen in a timely manner. Thank you, we appreciate your assistance with this!

## 2022-08-25 NOTE — Progress Notes (Signed)
    SUBJECTIVE:   CHIEF COMPLAINT / HPI:   Patient presents for hypertension follow up. Denies chest pain, dyspnea and worsening leg swelling. During the last visit, he was advised to stop his losartan due to hyperkalemia. Endorses continued compliance on amlodipine, spirononactone and bisoprolol. Denies any new concerns at this time.   OBJECTIVE:   BP 132/60   Pulse 70   Ht 5\' 10"  (1.778 m)   Wt 163 lb 8 oz (74.2 kg)   SpO2 100%   BMI 23.46 kg/m   General: Patient well-appearing, in no acute distress. CV: RRR, no murmurs or gallops auscultated Resp: CTAB, no wheezing, rales or rhonchi noted Ext: mild non-pitting edema noted along LE bilaterally Psych: mood appropriate, very pleasant   ASSESSMENT/PLAN:   HYPERTENSION, BENIGN SYSTEMIC -BP 132/60, at goal -continue amlodipine, bisoprolol and spironolactone -continue to hold losartan given hyperkalemia -pending BMP to assess renal function and prior hyperkalemia  -advised to elevate legs  -follow up in 2 weeks for BP check or sooner as appropriate    -PHQ-9 score of 0 reviewed.   Reece Leader, DO [facility]

## 2022-08-25 NOTE — Assessment & Plan Note (Signed)
-  BP 132/60, at goal -continue amlodipine, bisoprolol and spironolactone -continue to hold losartan given hyperkalemia -pending BMP to assess renal function and prior hyperkalemia  -advised to elevate legs  -follow up in 2 weeks for BP check or sooner as appropriate

## 2022-08-26 LAB — BASIC METABOLIC PANEL
BUN/Creatinine Ratio: 16 (ref 10–24)
BUN: 35 mg/dL — ABNORMAL HIGH (ref 8–27)
CO2: 16 mmol/L — ABNORMAL LOW (ref 20–29)
Calcium: 9.1 mg/dL (ref 8.6–10.2)
Chloride: 103 mmol/L (ref 96–106)
Creatinine, Ser: 2.25 mg/dL — ABNORMAL HIGH (ref 0.76–1.27)
Glucose: 97 mg/dL (ref 70–99)
Potassium: 5.5 mmol/L — ABNORMAL HIGH (ref 3.5–5.2)
Sodium: 134 mmol/L (ref 134–144)
eGFR: 29 mL/min/{1.73_m2} — ABNORMAL LOW (ref >59)

## 2022-09-01 NOTE — Progress Notes (Signed)
Pt has appt on 04/25 

## 2022-09-02 ENCOUNTER — Other Ambulatory Visit: Payer: Self-pay

## 2022-09-02 MED ORDER — TRAMADOL HCL 50 MG PO TABS: ORAL_TABLET | ORAL | 0 refills | Status: DC

## 2022-09-08 ENCOUNTER — Ambulatory Visit (INDEPENDENT_AMBULATORY_CARE_PROVIDER_SITE_OTHER): Payer: 59 | Admitting: Family Medicine

## 2022-09-08 ENCOUNTER — Encounter: Payer: Self-pay | Admitting: Family Medicine

## 2022-09-08 VITALS — BP 142/59 | HR 52 | Ht 70.0 in | Wt 158.2 lb

## 2022-09-08 DIAGNOSIS — I10 Essential (primary) hypertension: Secondary | ICD-10-CM

## 2022-09-08 DIAGNOSIS — E875 Hyperkalemia: Secondary | ICD-10-CM

## 2022-09-08 NOTE — Assessment & Plan Note (Signed)
-  BP slightly elevated initially, at goal on repeat. Goal of <140/90. Slightly low diastolic. Checked home BP cuff for comparison which was 143/63.  -continue bisoprolol 5 mg and amlodipine 5 mg daily -follow up in 1 month with PCP

## 2022-09-08 NOTE — Patient Instructions (Signed)
It was great seeing you today!  Today we discussed your blood pressure which looks good. Please continue to take only your bisprolol and amlodipine daily. Do not take losartan and spironolactone. We will recheck your potassium level today.   Please follow up at your next scheduled appointment in 1 month, if anything arises between now and then, please don't hesitate to contact our office.   Thank you for allowing [place] to be a part of your medical care!  Thank you, Dr. Robyne Peers

## 2022-09-08 NOTE — Assessment & Plan Note (Signed)
-  noted to be hyperkalemic on last 2 attempts at checking K, discontinued both losartan and spironolactone. Reassuringly patient remains asymptomatic. Discussed the importance of maintaining K levels within normal range.  -pending repeat BMP

## 2022-09-08 NOTE — Progress Notes (Signed)
    SUBJECTIVE:   CHIEF COMPLAINT / HPI:   Patient presents for follow up, history of hypertension. Found to have hyperkalemia and discontinued losartan at a prior visit. Later spironolactone was also discontinued as patient seemed to have continued hyperkalemia. Compliant on his regimen of bisprolol 5 mg daily and amlodipine 5 mg daily. Denies chest pain, dyspnea, leg swelling or any other concerns. Walks at least 3 times a day outdoors, uses his walker. Patient's BP at home has ranged around systolic 140s.   OBJECTIVE:   BP (!) 142/59   Pulse (!) 52   Ht  (1.778 m)   Wt 158 lb 3.2 oz (71.8 kg)   SpO2 100%   BMI 22.70 kg/m   General: Patient well-appearing, in no acute distress. CV: RRR, no murmurs or gallops auscultated Resp: CTAB, no wheezing, rales or rhonchi noted Ext: trace to no LE edema noted bilaterally   ASSESSMENT/PLAN:   Hyperkalemia -noted to be hyperkalemic on last 2 attempts at checking K, discontinued both losartan and spironolactone. Reassuringly patient remains asymptomatic. Discussed the importance of maintaining K levels within normal range.  -pending repeat BMP  HYPERTENSION, BENIGN SYSTEMIC -BP slightly elevated initially, at goal on repeat. Goal of <140/90. Slightly low diastolic. Checked home BP cuff for comparison which was 143/63.  -continue bisoprolol 5 mg and amlodipine 5 mg daily -follow up in 1 month with PCP    Donald Leader, DO [facility]

## 2022-09-09 LAB — BASIC METABOLIC PANEL
BUN/Creatinine Ratio: 13 (ref 10–24)
BUN: 31 mg/dL — ABNORMAL HIGH (ref 8–27)
CO2: 18 mmol/L — ABNORMAL LOW (ref 20–29)
Calcium: 8.9 mg/dL (ref 8.6–10.2)
Chloride: 109 mmol/L — ABNORMAL HIGH (ref 96–106)
Creatinine, Ser: 2.4 mg/dL — ABNORMAL HIGH (ref 0.76–1.27)
Glucose: 98 mg/dL (ref 70–99)
Potassium: 4.9 mmol/L (ref 3.5–5.2)
Sodium: 141 mmol/L (ref 134–144)
eGFR: 27 mL/min/{1.73_m2} — ABNORMAL LOW (ref >59)

## 2022-09-29 ENCOUNTER — Telehealth: Payer: Self-pay | Admitting: Family Medicine

## 2022-09-29 NOTE — Telephone Encounter (Signed)
Called patient to schedule Medicare Annual Wellness Visit (AWV). Left message for patient to call back and schedule Medicare Annual Wellness Visit (AWV).  Last date of AWV: 05/04/2016  Please schedule an AWVS appointment at any time with [facility] VISIT.  If any questions, please contact me at [phone].    Thank you,  [facility] Support [facility] Direct dial  [phone]

## 2022-10-11 ENCOUNTER — Ambulatory Visit (INDEPENDENT_AMBULATORY_CARE_PROVIDER_SITE_OTHER): Payer: 59 | Admitting: Family Medicine

## 2022-10-11 ENCOUNTER — Encounter: Payer: Self-pay | Admitting: Family Medicine

## 2022-10-11 VITALS — BP 136/78 | HR 60 | Wt 170.0 lb

## 2022-10-11 DIAGNOSIS — I10 Essential (primary) hypertension: Secondary | ICD-10-CM

## 2022-10-11 DIAGNOSIS — H9313 Tinnitus, bilateral: Secondary | ICD-10-CM | POA: Diagnosis not present

## 2022-10-11 DIAGNOSIS — M87052 Idiopathic aseptic necrosis of left femur: Secondary | ICD-10-CM

## 2022-10-11 MED ORDER — TRAMADOL HCL 50 MG PO TABS: ORAL_TABLET | ORAL | 0 refills | Status: DC

## 2022-10-11 NOTE — Assessment & Plan Note (Signed)
Stable pain, still able to ambulate several miles per day - Refill of Tramadol 50mg  [facility]

## 2022-10-11 NOTE — Progress Notes (Signed)
    SUBJECTIVE:   CHIEF COMPLAINT / HPI:   HTN: - Medications: Amlodipine 5mg  daily, bisprolol 5mg  daily  - Compliance: good - Denies any SOB, CP, vision changes, LE edema, medication SEs, or symptoms of hypotension  AVN - Stable with conserative management - Still walks several miles per day with minimal issue - Uses Tramadol PRN at night  Hearing loss - Constant tinnitus in his ears - Would like to have hearing tested  - Considering hearing aids  Family Losses - Has lost 2 family members in the last few months - Is dealing with the emotions well - He is no stranger to death or grief and is coping well and helping his family    PERTINENT  PMH / PSH: Reviewed  OBJECTIVE:   BP 136/78   Pulse 60   Wt 170 lb (77.1 kg)   SpO2 97%   BMI 24.39 kg/m   Gen: well-appearing, NAD CV: RRR, no m/r/g appreciated, no peripheral edema Pulm: CTAB, no wheezes/crackles  ASSESSMENT/PLAN:   HYPERTENSION, BENIGN SYSTEMIC BP stable today at 136/78 and has not had any adverse effects with medications. - Continue Bisprolol 5mg  and amlodipine 5mg  daily  - Return in 3 months for check  Avascular necrosis of bone of left hip (HCC) Stable pain, still able to ambulate several miles per day - Refill of Tramadol 50mg  [facility]    Tinnitus Likely age related presbycusis, no red flags with description of symptoms. Has had symptoms for several years. - Referral to audiology   Evelena Leyden, DO [facility]

## 2022-10-11 NOTE — Patient Instructions (Signed)
I sent in the prescription for your pain medication.   I have also sent a referral to the audiologist to test your hearing to see if they have recommendations for hearing aids.   Everything else looks good today. Keep an eye on your phone and voicemails as you are due for a medicare annual wellness visit which is typically done on the phone.

## 2022-10-11 NOTE — Assessment & Plan Note (Signed)
BP stable today at 136/78 and has not had any adverse effects with medications. - Continue Bisprolol 5mg  and amlodipine 5mg  daily  - Return in 3 months for check

## 2022-10-20 ENCOUNTER — Ambulatory Visit: Payer: 59 | Attending: Audiologist | Admitting: Audiologist

## 2022-10-20 DIAGNOSIS — H903 Sensorineural hearing loss, bilateral: Secondary | ICD-10-CM | POA: Insufficient documentation

## 2022-10-25 ENCOUNTER — Inpatient Hospital Stay (HOSPITAL_COMMUNITY)
Admission: EM | Admit: 2022-10-25 | Discharge: 2022-10-29 | DRG: 433 | Disposition: A | Discharge status: Home / Self Care | Payer: 59 | Attending: Family Medicine | Admitting: Family Medicine

## 2022-10-25 ENCOUNTER — Other Ambulatory Visit: Payer: Self-pay

## 2022-10-25 DIAGNOSIS — Z8249 Family history of ischemic heart disease and other diseases of the circulatory system: Secondary | ICD-10-CM

## 2022-10-25 DIAGNOSIS — E8779 Other fluid overload: Secondary | ICD-10-CM

## 2022-10-25 DIAGNOSIS — Z79899 Other long term (current) drug therapy: Secondary | ICD-10-CM

## 2022-10-25 DIAGNOSIS — R140 Abdominal distension (gaseous): Secondary | ICD-10-CM | POA: Diagnosis present

## 2022-10-25 DIAGNOSIS — M7989 Other specified soft tissue disorders: Secondary | ICD-10-CM | POA: Diagnosis present

## 2022-10-25 DIAGNOSIS — I2721 Secondary pulmonary arterial hypertension: Secondary | ICD-10-CM | POA: Diagnosis present

## 2022-10-25 DIAGNOSIS — K5900 Constipation, unspecified: Secondary | ICD-10-CM | POA: Diagnosis present

## 2022-10-25 DIAGNOSIS — N184 Chronic kidney disease, stage 4 (severe): Secondary | ICD-10-CM | POA: Diagnosis present

## 2022-10-25 DIAGNOSIS — K7460 Unspecified cirrhosis of liver: Secondary | ICD-10-CM | POA: Diagnosis present

## 2022-10-25 DIAGNOSIS — Z720 Tobacco use: Secondary | ICD-10-CM

## 2022-10-25 DIAGNOSIS — M1990 Unspecified osteoarthritis, unspecified site: Secondary | ICD-10-CM | POA: Diagnosis present

## 2022-10-25 DIAGNOSIS — E8770 Fluid overload, unspecified: Secondary | ICD-10-CM | POA: Diagnosis present

## 2022-10-25 DIAGNOSIS — R109 Unspecified abdominal pain: Secondary | ICD-10-CM | POA: Diagnosis not present

## 2022-10-25 DIAGNOSIS — E785 Hyperlipidemia, unspecified: Secondary | ICD-10-CM | POA: Diagnosis present

## 2022-10-25 DIAGNOSIS — I10 Essential (primary) hypertension: Secondary | ICD-10-CM | POA: Diagnosis present

## 2022-10-25 DIAGNOSIS — N5089 Other specified disorders of the male genital organs: Secondary | ICD-10-CM | POA: Diagnosis present

## 2022-10-25 DIAGNOSIS — Z825 Family history of asthma and other chronic lower respiratory diseases: Secondary | ICD-10-CM

## 2022-10-25 DIAGNOSIS — Z634 Disappearance and death of family member: Secondary | ICD-10-CM

## 2022-10-25 DIAGNOSIS — K8020 Calculus of gallbladder without cholecystitis without obstruction: Secondary | ICD-10-CM | POA: Diagnosis present

## 2022-10-25 DIAGNOSIS — I872 Venous insufficiency (chronic) (peripheral): Secondary | ICD-10-CM | POA: Diagnosis present

## 2022-10-25 DIAGNOSIS — K7031 Alcoholic cirrhosis of liver with ascites: Principal | ICD-10-CM | POA: Diagnosis present

## 2022-10-25 DIAGNOSIS — R600 Localized edema: Secondary | ICD-10-CM | POA: Diagnosis present

## 2022-10-25 DIAGNOSIS — K858 Other acute pancreatitis without necrosis or infection: Principal | ICD-10-CM

## 2022-10-25 DIAGNOSIS — I483 Typical atrial flutter: Secondary | ICD-10-CM | POA: Diagnosis present

## 2022-10-25 DIAGNOSIS — F1011 Alcohol abuse, in remission: Secondary | ICD-10-CM | POA: Diagnosis present

## 2022-10-25 DIAGNOSIS — M109 Gout, unspecified: Secondary | ICD-10-CM | POA: Diagnosis present

## 2022-10-25 DIAGNOSIS — K852 Alcohol induced acute pancreatitis without necrosis or infection: Secondary | ICD-10-CM

## 2022-10-25 DIAGNOSIS — J45909 Unspecified asthma, uncomplicated: Secondary | ICD-10-CM | POA: Diagnosis present

## 2022-10-25 DIAGNOSIS — I8510 Secondary esophageal varices without bleeding: Secondary | ICD-10-CM | POA: Diagnosis present

## 2022-10-25 DIAGNOSIS — D539 Nutritional anemia, unspecified: Secondary | ICD-10-CM | POA: Diagnosis present

## 2022-10-25 DIAGNOSIS — Z7722 Contact with and (suspected) exposure to environmental tobacco smoke (acute) (chronic): Secondary | ICD-10-CM | POA: Diagnosis present

## 2022-10-25 DIAGNOSIS — I130 Hypertensive heart and chronic kidney disease with heart failure and stage 1 through stage 4 chronic kidney disease, or unspecified chronic kidney disease: Secondary | ICD-10-CM | POA: Diagnosis present

## 2022-10-25 DIAGNOSIS — N1832 Chronic kidney disease, stage 3b: Secondary | ICD-10-CM | POA: Diagnosis present

## 2022-10-25 DIAGNOSIS — Z66 Do not resuscitate: Secondary | ICD-10-CM | POA: Diagnosis present

## 2022-10-25 DIAGNOSIS — M879 Osteonecrosis, unspecified: Secondary | ICD-10-CM | POA: Diagnosis present

## 2022-10-25 DIAGNOSIS — R001 Bradycardia, unspecified: Secondary | ICD-10-CM | POA: Diagnosis present

## 2022-10-25 DIAGNOSIS — R748 Abnormal levels of other serum enzymes: Secondary | ICD-10-CM

## 2022-10-25 DIAGNOSIS — I5032 Chronic diastolic (congestive) heart failure: Secondary | ICD-10-CM | POA: Diagnosis present

## 2022-10-25 LAB — CBC
HCT: 30.7 % — ABNORMAL LOW (ref 39.0–52.0)
Hemoglobin: 10.3 g/dL — ABNORMAL LOW (ref 13.0–17.0)
MCH: 33.7 pg (ref 26.0–34.0)
MCHC: 33.6 g/dL (ref 30.0–36.0)
MCV: 100.3 fL — ABNORMAL HIGH (ref 80.0–100.0)
Platelets: 153 10*3/uL (ref 150–400)
RBC: 3.06 MIL/uL — ABNORMAL LOW (ref 4.22–5.81)
RDW: 14 % (ref 11.5–15.5)
WBC: 5.9 10*3/uL (ref 4.0–10.5)
nRBC: 0 % (ref 0.0–0.2)

## 2022-10-25 NOTE — ED Triage Notes (Signed)
Patient reports pain across his abdomen with distention and emesis this week . Bradycardic at triage .

## 2022-10-26 ENCOUNTER — Emergency Department (HOSPITAL_COMMUNITY): Payer: 59

## 2022-10-26 ENCOUNTER — Observation Stay (HOSPITAL_COMMUNITY): Payer: 59

## 2022-10-26 ENCOUNTER — Observation Stay (HOSPITAL_BASED_OUTPATIENT_CLINIC_OR_DEPARTMENT_OTHER): Payer: 59

## 2022-10-26 ENCOUNTER — Ambulatory Visit: Payer: 59 | Admitting: Podiatry

## 2022-10-26 DIAGNOSIS — M109 Gout, unspecified: Secondary | ICD-10-CM | POA: Diagnosis present

## 2022-10-26 DIAGNOSIS — I5032 Chronic diastolic (congestive) heart failure: Secondary | ICD-10-CM | POA: Diagnosis present

## 2022-10-26 DIAGNOSIS — M7989 Other specified soft tissue disorders: Secondary | ICD-10-CM | POA: Diagnosis present

## 2022-10-26 DIAGNOSIS — I483 Typical atrial flutter: Secondary | ICD-10-CM | POA: Diagnosis present

## 2022-10-26 DIAGNOSIS — R109 Unspecified abdominal pain: Secondary | ICD-10-CM | POA: Diagnosis present

## 2022-10-26 DIAGNOSIS — Z66 Do not resuscitate: Secondary | ICD-10-CM | POA: Diagnosis present

## 2022-10-26 DIAGNOSIS — I272 Pulmonary hypertension, unspecified: Secondary | ICD-10-CM

## 2022-10-26 DIAGNOSIS — K7031 Alcoholic cirrhosis of liver with ascites: Secondary | ICD-10-CM

## 2022-10-26 DIAGNOSIS — Z8249 Family history of ischemic heart disease and other diseases of the circulatory system: Secondary | ICD-10-CM | POA: Diagnosis not present

## 2022-10-26 DIAGNOSIS — K8020 Calculus of gallbladder without cholecystitis without obstruction: Secondary | ICD-10-CM | POA: Diagnosis present

## 2022-10-26 DIAGNOSIS — J45909 Unspecified asthma, uncomplicated: Secondary | ICD-10-CM | POA: Diagnosis present

## 2022-10-26 DIAGNOSIS — R748 Abnormal levels of other serum enzymes: Secondary | ICD-10-CM | POA: Diagnosis not present

## 2022-10-26 DIAGNOSIS — N5089 Other specified disorders of the male genital organs: Secondary | ICD-10-CM | POA: Diagnosis present

## 2022-10-26 DIAGNOSIS — Z825 Family history of asthma and other chronic lower respiratory diseases: Secondary | ICD-10-CM | POA: Diagnosis not present

## 2022-10-26 DIAGNOSIS — I130 Hypertensive heart and chronic kidney disease with heart failure and stage 1 through stage 4 chronic kidney disease, or unspecified chronic kidney disease: Secondary | ICD-10-CM | POA: Diagnosis present

## 2022-10-26 DIAGNOSIS — K852 Alcohol induced acute pancreatitis without necrosis or infection: Secondary | ICD-10-CM

## 2022-10-26 DIAGNOSIS — R609 Edema, unspecified: Secondary | ICD-10-CM

## 2022-10-26 DIAGNOSIS — I2721 Secondary pulmonary arterial hypertension: Secondary | ICD-10-CM | POA: Diagnosis present

## 2022-10-26 DIAGNOSIS — R600 Localized edema: Secondary | ICD-10-CM

## 2022-10-26 DIAGNOSIS — F1011 Alcohol abuse, in remission: Secondary | ICD-10-CM | POA: Diagnosis present

## 2022-10-26 DIAGNOSIS — Z79899 Other long term (current) drug therapy: Secondary | ICD-10-CM | POA: Diagnosis not present

## 2022-10-26 DIAGNOSIS — D539 Nutritional anemia, unspecified: Secondary | ICD-10-CM | POA: Diagnosis present

## 2022-10-26 DIAGNOSIS — M879 Osteonecrosis, unspecified: Secondary | ICD-10-CM | POA: Diagnosis present

## 2022-10-26 DIAGNOSIS — R140 Abdominal distension (gaseous): Secondary | ICD-10-CM | POA: Diagnosis present

## 2022-10-26 DIAGNOSIS — R0609 Other forms of dyspnea: Secondary | ICD-10-CM | POA: Diagnosis not present

## 2022-10-26 DIAGNOSIS — K5900 Constipation, unspecified: Secondary | ICD-10-CM | POA: Diagnosis present

## 2022-10-26 DIAGNOSIS — I8510 Secondary esophageal varices without bleeding: Secondary | ICD-10-CM | POA: Diagnosis present

## 2022-10-26 DIAGNOSIS — E785 Hyperlipidemia, unspecified: Secondary | ICD-10-CM | POA: Diagnosis present

## 2022-10-26 DIAGNOSIS — Z720 Tobacco use: Secondary | ICD-10-CM | POA: Diagnosis not present

## 2022-10-26 DIAGNOSIS — Z634 Disappearance and death of family member: Secondary | ICD-10-CM | POA: Diagnosis not present

## 2022-10-26 DIAGNOSIS — N1832 Chronic kidney disease, stage 3b: Secondary | ICD-10-CM | POA: Diagnosis present

## 2022-10-26 HISTORY — PX: IR PARACENTESIS: IMG2679

## 2022-10-26 LAB — BODY FLUID CELL COUNT WITH DIFFERENTIAL
Eos, Fluid: 0 %
Lymphs, Fluid: 65 %
Monocyte-Macrophage-Serous Fluid: 33 % — ABNORMAL LOW (ref 50–90)
Neutrophil Count, Fluid: 2 % (ref 0–25)
Total Nucleated Cell Count, Fluid: 227 cu mm (ref 0–1000)

## 2022-10-26 LAB — LACTATE DEHYDROGENASE, PLEURAL OR PERITONEAL FLUID: LD, Fluid: 62 U/L — ABNORMAL HIGH (ref 3–23)

## 2022-10-26 LAB — COMPREHENSIVE METABOLIC PANEL
ALT: 50 U/L — ABNORMAL HIGH (ref 0–44)
AST: 121 U/L — ABNORMAL HIGH (ref 15–41)
Albumin: 3.1 g/dL — ABNORMAL LOW (ref 3.5–5.0)
Alkaline Phosphatase: 143 U/L — ABNORMAL HIGH (ref 38–126)
Anion gap: 9 (ref 5–15)
BUN: 31 mg/dL — ABNORMAL HIGH (ref 8–23)
CO2: 20 mmol/L — ABNORMAL LOW (ref 22–32)
Calcium: 8.9 mg/dL (ref 8.9–10.3)
Chloride: 111 mmol/L (ref 98–111)
Creatinine, Ser: 2.11 mg/dL — ABNORMAL HIGH (ref 0.61–1.24)
GFR, Estimated: 31 mL/min — ABNORMAL LOW (ref >60)
Glucose, Bld: 91 mg/dL (ref 70–99)
Potassium: 4.6 mmol/L (ref 3.5–5.1)
Sodium: 140 mmol/L (ref 135–145)
Total Bilirubin: 1.8 mg/dL — ABNORMAL HIGH (ref 0.3–1.2)
Total Protein: 6.7 g/dL (ref 6.5–8.1)

## 2022-10-26 LAB — ECHOCARDIOGRAM COMPLETE
AR max vel: 1.92 cm2
AV Area VTI: 1.68 cm2
AV Area mean vel: 1.86 cm2
AV Mean grad: 7.5 mmHg
AV Peak grad: 13.9 mmHg
Ao pk vel: 1.87 m/s
Area-P 1/2: 4.06 cm2
Calc EF: 57.7 %
Height: 70 in
S' Lateral: 3.8 cm
Single Plane A2C EF: 58.1 %
Single Plane A4C EF: 59 %
Weight: 2776.03 oz

## 2022-10-26 LAB — BASIC METABOLIC PANEL
Anion gap: 13 (ref 5–15)
BUN: 31 mg/dL — ABNORMAL HIGH (ref 8–23)
CO2: 19 mmol/L — ABNORMAL LOW (ref 22–32)
Calcium: 9 mg/dL (ref 8.9–10.3)
Chloride: 107 mmol/L (ref 98–111)
Creatinine, Ser: 2.13 mg/dL — ABNORMAL HIGH (ref 0.61–1.24)
GFR, Estimated: 31 mL/min — ABNORMAL LOW (ref >60)
Glucose, Bld: 93 mg/dL (ref 70–99)
Potassium: 4.7 mmol/L (ref 3.5–5.1)
Sodium: 139 mmol/L (ref 135–145)

## 2022-10-26 LAB — IRON AND TIBC
Iron: 54 ug/dL (ref 45–182)
Saturation Ratios: 26 % (ref 17.9–39.5)
TIBC: 204 ug/dL — ABNORMAL LOW (ref 250–450)
UIBC: 150 ug/dL

## 2022-10-26 LAB — FOLATE: Folate: 14 ng/mL (ref >5.9)

## 2022-10-26 LAB — URINALYSIS, ROUTINE W REFLEX MICROSCOPIC
Bilirubin Urine: NEGATIVE
Glucose, UA: NEGATIVE mg/dL
Ketones, ur: NEGATIVE mg/dL
Leukocytes,Ua: NEGATIVE
Nitrite: NEGATIVE
Protein, ur: 30 mg/dL — AB
Specific Gravity, Urine: 1.01 (ref 1.005–1.030)
pH: 5 (ref 5.0–8.0)

## 2022-10-26 LAB — GRAM STAIN: Gram Stain: NONE SEEN

## 2022-10-26 LAB — PROTIME-INR
INR: 1.4 — ABNORMAL HIGH (ref 0.8–1.2)
Prothrombin Time: 17.6 seconds — ABNORMAL HIGH (ref 11.4–15.2)

## 2022-10-26 LAB — HEPATITIS PANEL, ACUTE
HCV Ab: NONREACTIVE
Hep A IgM: NONREACTIVE
Hep B C IgM: NONREACTIVE
Hepatitis B Surface Ag: NONREACTIVE

## 2022-10-26 LAB — LIPASE, BLOOD: Lipase: 385 U/L — ABNORMAL HIGH (ref 11–51)

## 2022-10-26 LAB — BRAIN NATRIURETIC PEPTIDE: B Natriuretic Peptide: 1040.1 pg/mL — ABNORMAL HIGH (ref 0.0–100.0)

## 2022-10-26 LAB — RETICULOCYTES
Immature Retic Fract: 9.1 % (ref 2.3–15.9)
RBC.: 3.55 MIL/uL — ABNORMAL LOW (ref 4.22–5.81)
Retic Count, Absolute: 32.3 10*3/uL (ref 19.0–186.0)
Retic Ct Pct: 0.9 % (ref 0.4–3.1)

## 2022-10-26 LAB — AMMONIA: Ammonia: 50 umol/L — ABNORMAL HIGH (ref 9–35)

## 2022-10-26 LAB — FERRITIN: Ferritin: 772 ng/mL — ABNORMAL HIGH (ref 24–336)

## 2022-10-26 LAB — MAGNESIUM: Magnesium: 2.4 mg/dL (ref 1.7–2.4)

## 2022-10-26 LAB — GLUCOSE, PLEURAL OR PERITONEAL FLUID: Glucose, Fluid: 98 mg/dL

## 2022-10-26 LAB — VITAMIN B12: Vitamin B-12: 894 pg/mL (ref 180–914)

## 2022-10-26 MED ORDER — SODIUM CHLORIDE 0.9 % IV SOLN
1.0000 g | Freq: Once | INTRAVENOUS | Status: AC
  Administered 2022-10-26: 1 g via INTRAVENOUS
  Filled 2022-10-26: qty 10

## 2022-10-26 MED ORDER — LIDOCAINE-EPINEPHRINE (PF) 2 %-1:200000 IJ SOLN
10.0000 mL | Freq: Once | INTRAMUSCULAR | Status: AC
  Administered 2022-10-26: 10 mL via INTRADERMAL
  Filled 2022-10-26: qty 20

## 2022-10-26 MED ORDER — ACETAMINOPHEN 650 MG RE SUPP: 650.0000 mg | Freq: Four times a day (QID) | RECTAL | Status: DC | PRN

## 2022-10-26 MED ORDER — ONDANSETRON HCL 4 MG/2ML IJ SOLN
4.0000 mg | Freq: Once | INTRAMUSCULAR | Status: AC
  Administered 2022-10-26: 4 mg via INTRAVENOUS
  Filled 2022-10-26: qty 2

## 2022-10-26 MED ORDER — FUROSEMIDE 10 MG/ML IJ SOLN
40.0000 mg | Freq: Once | INTRAMUSCULAR | Status: AC
  Administered 2022-10-26: 40 mg via INTRAVENOUS
  Filled 2022-10-26: qty 4

## 2022-10-26 MED ORDER — ACETAMINOPHEN 650 MG RE SUPP: 325.0000 mg | Freq: Four times a day (QID) | RECTAL | Status: DC | PRN

## 2022-10-26 MED ORDER — ALLOPURINOL 100 MG PO TABS
50.0000 mg | ORAL_TABLET | Freq: Every day | ORAL | Status: DC
  Administered 2022-10-26 – 2022-10-27 (×2): 50 mg via ORAL
  Filled 2022-10-26 (×3): qty 1

## 2022-10-26 MED ORDER — ACETAMINOPHEN 500 MG PO TABS
500.0000 mg | ORAL_TABLET | Freq: Four times a day (QID) | ORAL | Status: DC | PRN
  Administered 2022-10-28: 500 mg via ORAL
  Filled 2022-10-26: qty 1

## 2022-10-26 MED ORDER — MONTELUKAST SODIUM 10 MG PO TABS
10.0000 mg | ORAL_TABLET | Freq: Every day | ORAL | Status: DC
  Administered 2022-10-26 – 2022-10-28 (×3): 10 mg via ORAL
  Filled 2022-10-26 (×3): qty 1

## 2022-10-26 MED ORDER — LIDOCAINE HCL 1 % IJ SOLN
INTRAMUSCULAR | Status: AC
  Filled 2022-10-26: qty 20

## 2022-10-26 MED ORDER — AMLODIPINE BESYLATE 5 MG PO TABS
5.0000 mg | ORAL_TABLET | Freq: Every day | ORAL | Status: DC
  Administered 2022-10-26: 5 mg via ORAL
  Filled 2022-10-26: qty 1

## 2022-10-26 MED ORDER — POLYETHYLENE GLYCOL 3350 17 G PO PACK
17.0000 g | PACK | Freq: Every day | ORAL | Status: DC
  Administered 2022-10-26 – 2022-10-28 (×3): 17 g via ORAL
  Filled 2022-10-26 (×4): qty 1

## 2022-10-26 MED ORDER — SPIRONOLACTONE 25 MG PO TABS
25.0000 mg | ORAL_TABLET | Freq: Every day | ORAL | Status: DC
  Administered 2022-10-26 – 2022-10-29 (×4): 25 mg via ORAL
  Filled 2022-10-26 (×5): qty 1

## 2022-10-26 MED ORDER — FLUTICASONE FUROATE-VILANTEROL 200-25 MCG/ACT IN AEPB
1.0000 | INHALATION_SPRAY | Freq: Every day | RESPIRATORY_TRACT | Status: DC
  Administered 2022-10-26 – 2022-10-29 (×4): 1 via RESPIRATORY_TRACT
  Filled 2022-10-26: qty 28

## 2022-10-26 MED ORDER — FENTANYL CITRATE PF 50 MCG/ML IJ SOSY
50.0000 ug | PREFILLED_SYRINGE | Freq: Once | INTRAMUSCULAR | Status: AC
  Administered 2022-10-26: 50 ug via INTRAVENOUS
  Filled 2022-10-26: qty 1

## 2022-10-26 MED ORDER — VITAMIN D 25 MCG (1000 UNIT) PO TABS
1000.0000 [IU] | ORAL_TABLET | Freq: Every day | ORAL | Status: DC
  Administered 2022-10-26 – 2022-10-27 (×2): 1000 [IU] via ORAL
  Filled 2022-10-26 (×3): qty 1

## 2022-10-26 MED ORDER — TRAMADOL HCL 50 MG PO TABS
50.0000 mg | ORAL_TABLET | Freq: Every evening | ORAL | Status: DC | PRN
  Administered 2022-10-28: 50 mg via ORAL
  Filled 2022-10-26: qty 1

## 2022-10-26 MED ORDER — ENOXAPARIN SODIUM 30 MG/0.3ML IJ SOSY
30.0000 mg | PREFILLED_SYRINGE | INTRAMUSCULAR | Status: DC
  Administered 2022-10-26 – 2022-10-28 (×3): 30 mg via SUBCUTANEOUS
  Filled 2022-10-26 (×3): qty 0.3

## 2022-10-26 MED ORDER — BISOPROLOL FUMARATE 5 MG PO TABS
5.0000 mg | ORAL_TABLET | Freq: Every day | ORAL | Status: DC
  Administered 2022-10-26: 5 mg via ORAL
  Filled 2022-10-26 (×2): qty 1

## 2022-10-26 MED ORDER — ADULT MULTIVITAMIN W/MINERALS CH
1.0000 | ORAL_TABLET | Freq: Every day | ORAL | Status: DC
  Administered 2022-10-26 – 2022-10-27 (×2): 1 via ORAL
  Filled 2022-10-26 (×3): qty 1

## 2022-10-26 MED ORDER — ACETAMINOPHEN 325 MG PO TABS: 650.0000 mg | ORAL_TABLET | Freq: Four times a day (QID) | ORAL | Status: DC | PRN

## 2022-10-26 MED ORDER — FUROSEMIDE 20 MG PO TABS
20.0000 mg | ORAL_TABLET | Freq: Every day | ORAL | Status: DC
  Administered 2022-10-26 – 2022-10-29 (×4): 20 mg via ORAL
  Filled 2022-10-26 (×5): qty 1

## 2022-10-26 NOTE — ED Provider Notes (Signed)
[facility] Provider Note  CSN: [id number] Arrival date & time: 10/25/22 2301  Chief Complaint(s) Abdominal Pain (Bradycardic) and Ascites  HPI Donald Moreno is a 82 y.o. male with a past medical history listed below including CHF with a last EF of 50 to 55% in 2022, alcoholic cirrhosis with ascites requiring paracentesis, cholelithiasis noted on prior imaging who presents to the emergency department several days of abdominal pain, intermittent in nature worse this evening.  It became severe around 9 to 10 PM.  Patient had reportedly had emesis this week but has not had any this afternoon. Still having BM. No known fevers or chills.  No chest pain.  Patient has chronic peripheral edema.  The history is provided by a relative and the patient.    Past Medical History Past Medical History:  Diagnosis Date   Acute congestive heart failure (HCC)    Arthritis    "all over"   Asthma    Blood in stool 04/11/2017   Chronic kidney disease    "I see a kidney dr @ [facility]" (10/11/2016)   Chronic lower back pain    "turned a truck over a long time ago" (10/11/2016)   Dysrhythmia    bradycardia with occassional junctional rhythm   ETOH abuse    Gout    Hypertension    Inguinal hernia 10/28/2014   S/P inguinal hernia repair with 4L as ascites removed 03/30/17   Liver cirrhosis (HCC) 2015   Macrocytosis    Pneumonia    "long long time ago" (10/11/2016)   Spontaneous bacterial peritonitis (HCC) 07/01/2015   Patient Active Problem List   Diagnosis Date Noted   Pancreatitis, alcoholic, acute 10/26/2022   Abdominal pain 10/26/2022   Venous insufficiency of both lower extremities 11/17/2021   Esophageal varices (HCC) 03/19/2021   Lesion of liver 03/19/2021   Long term (current) use of anticoagulants 03/19/2021   Avascular necrosis of bone of left hip (HCC) 07/02/2020   History of anemia due to CKD 02/20/2018   Secondary hyperparathyroidism of renal origin (HCC)  02/20/2018   Macrocytic anemia 04/20/2017   Cirrhosis of liver (HCC)    Chronic heart failure with preserved ejection fraction (HFpEF) (HCC)    Hematuria 01/30/2017   Alcoholic cirrhosis of liver without ascites (HCC)    Typical atrial flutter (HCC)    CKD (chronic kidney disease) stage 4, GFR 15-29 ml/min (HCC) 05/27/2015   At risk for decreased bone density 03/04/2015   Bradycardia 07/08/2013   Advanced directives, counseling/discussion 01/03/2011   Asthma 01/29/2010   Gout 07/13/2006   HYPERTENSION, BENIGN SYSTEMIC 07/13/2006   Arthritis 07/13/2006   Home Medication(s) Prior to Admission medications   Medication Sig Start Date End Date Taking? Authorizing Provider  allopurinol (ZYLOPRIM) 100 MG tablet TAKE 1/2 TABLET BY MOUTH EVERY DAY Patient taking differently: Take 50 mg by mouth daily. 06/16/22  Yes Lilland, Alana, DO  amLODipine (NORVASC) 5 MG tablet Take 1 tablet (5 mg total) by mouth daily. 05/12/22  Yes Croitoru, Mihai, MD  bisoprolol (ZEBETA) 5 MG tablet Take 1 tablet by mouth daily 04/27/22  Yes Croitoru, Mihai, MD  Cholecalciferol (VITAMIN D-3) 5000 units TABS Take 1,000 Units by mouth daily.   Yes [provider]  montelukast (SINGULAIR) 10 MG tablet TAKE 1 TABLET BY MOUTH EVERYDAY AT BEDTIME Patient taking differently: Take 10 mg by mouth at bedtime. 07/15/22  Yes Lilland, Alana, DO  traMADol (ULTRAM) 50 MG tablet TAKE 1 TABLET BY MOUTH EVERY  DAY AT BEDTIME AS NEEDED 10/11/22  Yes Lilland, Alana, DO  WIXELA INHUB 500-50 MCG/ACT AEPB TAKE 1 PUFF BY MOUTH TWICE A DAY Patient taking differently: Inhale 1 puff into the lungs in the morning and at bedtime. 08/17/22  Yes Littie Deeds, MD  albuterol [facility] HFA) 108 (90 Base) MCG/ACT inhaler Inhale 2 puffs into the lungs every 4 (four) hours as needed for wheezing. Patient not taking: Reported on 04/27/2022 05/24/18   Shirley, [place], DO  furosemide (LASIX) 40 MG tablet TAKE 1 TAB 3 TIMES A DAY FOR WEIGHT >168LB 2 TIMES A DAY  FOR WEIGHT 163-168LB ONCE DAILY FOR WEIGHT <163LB 12/24/18 07/20/19  Shirley, [place], DO                                                                                                                                    Allergies Patient has no known allergies.  Review of Systems Review of Systems As noted in HPI  Physical Exam Vital Signs  I have reviewed the triage vital signs BP (!) 156/73 (BP Location: Left Arm)   Pulse 69   Temp 98.1 F (36.7 C) (Oral)   Resp 18   SpO2 100%   Physical Exam Vitals reviewed.  Constitutional:      General: He is not in acute distress.    Appearance: He is well-developed. He is not diaphoretic.  HENT:     Head: Normocephalic and atraumatic.     Right Ear: External ear normal.     Left Ear: External ear normal.     Nose: Nose normal.     Mouth/Throat:     Mouth: Mucous membranes are moist.  Eyes:     General: No scleral icterus.    Conjunctiva/sclera: Conjunctivae normal.  Neck:     Trachea: Phonation normal.  Cardiovascular:     Rate and Rhythm: Normal rate and regular rhythm.  Pulmonary:     Effort: Pulmonary effort is normal. No respiratory distress.     Breath sounds: No stridor.  Abdominal:     General: There is distension.     Tenderness: There is generalized abdominal tenderness.  Musculoskeletal:        General: Normal range of motion.     Cervical back: Normal range of motion.     Right lower leg: 2+ Pitting Edema present.     Left lower leg: 2+ Pitting Edema present.  Neurological:     Mental Status: He is alert and oriented to person, place, and time.  Psychiatric:        Behavior: Behavior normal.     ED Results and Treatments Labs (all labs ordered are listed, but only abnormal results are displayed) Labs Reviewed  LIPASE, BLOOD - Abnormal; Notable for the following components:      Result Value   Lipase 385 (*)    All other components within normal limits  COMPREHENSIVE METABOLIC PANEL - Abnormal;  Notable for  the following components:   CO2 20 (*)    BUN 31 (*)    Creatinine, Ser 2.11 (*)    Albumin 3.1 (*)    AST 121 (*)    ALT 50 (*)    Alkaline Phosphatase 143 (*)    Total Bilirubin 1.8 (*)    GFR, Estimated 31 (*)    All other components within normal limits  CBC - Abnormal; Notable for the following components:   RBC 3.06 (*)    Hemoglobin 10.3 (*)    HCT 30.7 (*)    MCV 100.3 (*)    All other components within normal limits  URINALYSIS, ROUTINE W REFLEX MICROSCOPIC - Abnormal; Notable for the following components:   Color, Urine AMBER (*)    Hgb urine dipstick SMALL (*)    Protein, ur 30 (*)    Bacteria, UA RARE (*)    All other components within normal limits  BRAIN NATRIURETIC PEPTIDE - Abnormal; Notable for the following components:   B Natriuretic Peptide 1,040.1 (*)    All other components within normal limits  AMMONIA - Abnormal; Notable for the following components:   Ammonia 50 (*)    All other components within normal limits  PROTIME-INR - Abnormal; Notable for the following components:   Prothrombin Time 17.6 (*)    INR 1.4 (*)    All other components within normal limits  BASIC METABOLIC PANEL  MAGNESIUM                                                                                                                         EKG  EKG Interpretation  Date/Time:  Tuesday October 25 2022 23:14:45 EDT Ventricular Rate:  50 PR Interval:    QRS Duration: 78 QT Interval:  486 QTC Calculation: 443 R Axis:   54 Text Interpretation: Atrial flutter with slow ventricular response Nonspecific ST and T wave abnormality Abnormal ECG When compared with ECG of 17-Apr-2021 06:51, PREVIOUS ECG IS PRESENT Confirmed by Drema Pry [phone]) on 10/26/2022 12:46:57 AM       Radiology DG ABD ACUTE 2+V W 1V CHEST  Result Date: 10/26/2022 CLINICAL DATA:  Abdominal distension EXAM: DG ABDOMEN ACUTE WITH 1 VIEW CHEST COMPARISON:  06/09/2021 FINDINGS: Cardiomegaly, vascular congestion.  Bibasilar atelectasis or infiltrates, right greater than left. No visible effusions. No acute bony abnormality. Aortic atherosclerosis. IMPRESSION: Cardiomegaly, vascular congestion. Bibasilar atelectasis or infiltrates. Electronically Signed   By: Charlett Nose M.D.   On: 10/26/2022 02:22   CT ABDOMEN PELVIS WO CONTRAST  Result Date: 10/26/2022 CLINICAL DATA:  Bowel obstruction suspected EXAM: CT ABDOMEN AND PELVIS WITHOUT CONTRAST TECHNIQUE: Multidetector CT imaging of the abdomen and pelvis was performed following the standard protocol without IV contrast. RADIATION DOSE REDUCTION: This exam was performed according to the departmental dose-optimization program which includes automated exposure control, adjustment of the mA and/or kV according to patient size and/or use of iterative reconstruction technique. COMPARISON:  10/04/2020 FINDINGS:  Lower chest: Small bilateral pleural effusions. Bibasilar airspace opacities, likely atelectasis. Hepatobiliary: Numerous gallstones layering within the gallbladder. No visible biliary ductal dilatation. Small central cyst measuring 1.4 cm, stable. Prior tips. Pancreas: No focal abnormality or ductal dilatation. Spleen: No focal abnormality.  Normal size. Adrenals/Urinary Tract: 1 cm low-density lesion in the lower pole of the right kidney is stable since prior study, likely cyst. No follow-up imaging recommended. No stones or hydronephrosis. Adrenal glands and urinary bladder unremarkable. Stomach/Bowel: Stomach, large and small bowel grossly unremarkable. Vascular/Lymphatic: Aortic atherosclerosis. No evidence of aneurysm or adenopathy. Reproductive: Mildly prominent prostate Other: Large volume ascites in the abdomen and pelvis, new since prior study. Musculoskeletal: No acute bony abnormality. IMPRESSION: Prior tips within the liver. Large volume ascites in the abdomen and pelvis. Cholelithiasis. Aortic atherosclerosis. Electronically Signed   By: Charlett Nose M.D.    On: 10/26/2022 02:12   US Abdomen Limited RUQ (LIVER/GB)  Result Date: 10/26/2022 CLINICAL DATA:  Abdominal pain. EXAM: ULTRASOUND ABDOMEN LIMITED RIGHT UPPER QUADRANT COMPARISON:  None Available. FINDINGS: Gallbladder: Multiple shadowing, echogenic gallstones are seen within the gallbladder lumen. The largest measures approximately 1.1 cm. The gallbladder wall measures 3.3 mm in thickness. No sonographic Murphy sign noted by sonographer. Common bile duct: Diameter: 6.1 mm Liver: No focal lesion identified. The liver parenchyma is nodular in echotexture and diffusely increased in echogenicity. Portal vein is patent on color Doppler imaging with normal direction of blood flow towards the liver. Other: A large amount of ascites is present. IMPRESSION: 1. Cholelithiasis, without evidence of acute cholecystitis. 2. Hepatic steatosis and hepatic cirrhosis without focal liver lesions. 3. Ascites. Electronically Signed   By: Aram Candela M.D.   On: 10/26/2022 01:41    Medications Ordered in ED Medications  allopurinol (ZYLOPRIM) tablet 50 mg (has no administration in time range)  traMADol (ULTRAM) tablet 50 mg (has no administration in time range)  amLODipine (NORVASC) tablet 5 mg (has no administration in time range)  bisoprolol (ZEBETA) tablet 5 mg (has no administration in time range)  cholecalciferol (VITAMIN D3) 25 MCG (1000 UNIT) tablet 1,000 Units (has no administration in time range)  montelukast (SINGULAIR) tablet 10 mg (has no administration in time range)  fluticasone furoate-vilanterol (BREO ELLIPTA) 200-25 MCG/ACT 1 puff (has no administration in time range)  acetaminophen (TYLENOL) tablet 650 mg (has no administration in time range)    Or  acetaminophen (TYLENOL) suppository 650 mg (has no administration in time range)  multivitamin with minerals tablet 1 tablet (has no administration in time range)  polyethylene glycol (MIRALAX / GLYCOLAX) packet 17 g (has no administration in time  range)  cefTRIAXone (ROCEPHIN) 1 g in sodium chloride 0.9 % 100 mL IVPB (0 g Intravenous Stopped 10/26/22 0134)  fentaNYL (SUBLIMAZE) injection 50 mcg (50 mcg Intravenous Given 10/26/22 0103)  ondansetron (ZOFRAN) injection 4 mg (4 mg Intravenous Given 10/26/22 0102)  furosemide (LASIX) injection 40 mg (40 mg Intravenous Given 10/26/22 0434)  lidocaine-EPINEPHrine (XYLOCAINE W/EPI) 2 %-1:200000 (PF) injection 10 mL (10 mLs Intradermal Given 10/26/22 0434)   Procedures Procedures  (including critical care time) Medical Decision Making / ED Course   Medical Decision Making Amount and/or Complexity of Data Reviewed Labs: ordered. Decision-making details documented in ED Course. Radiology: ordered and independent interpretation performed. Decision-making details documented in ED Course. ECG/medicine tests: ordered and independent interpretation performed. Decision-making details documented in ED Course.  Risk Prescription drug management. Parenteral controlled substances. Decision regarding hospitalization.    Patient presented with severe abdominal  pain.  Differential includes but not limited to pancreatitis, biliary disease, serious intra-abdominal inflammatory/infectious process/bowel obstruction, SBP.  Patient also noted to be volume overloaded with peripheral edema.   BMP greater than the thousand which is double his baseline.   Patient provided with IV pain medicine.   CBC without leukocytosis.  Anemia with stable hemoglobin. Metabolic panel without significant electrolyte derangements.  Baseline renal function.  Mildly elevated LFTs.  Close to baseline. Evidence of pancreatitis with lipase 380s.  Up from 39 1 year ago. He was started on CTX.  Right upper quadrant ultrasound notable for cholelithiasis without evidence of acute cholecystitis. Acute abdominal series without evidence of bowel obstruction.  CT scan confirmed no bowel obstruction.  He does have large volume ascites.    On reexamination just prior to attempting to get a peritoneal sample to rule out SBP, patient reported complete relief of his abdominal pain.  Exam was completely benign with soft abdomen, making SBP unlikely.  Patient given a dose of Lasix.  Consulted FM for admission pancreatitis and CHF exacerbation. Patient might benefit from IR paracentesis during admission.    Final Clinical Impression(s) / ED Diagnoses Final diagnoses:  Other acute pancreatitis, unspecified complication status  Other hypervolemia    This chart was dictated using voice recognition software.  Despite best efforts to proofread,  errors can occur which can change the documentation meaning.    Nira Conn, MD 10/26/22 [phone]

## 2022-10-26 NOTE — TOC CM/SW Note (Signed)
Transition of Care [facility]) - Inpatient Brief Assessment   Patient Details  Name: Donald Moreno MRN: [id number] Date of Birth: 11/12/1940  [facility]) CM/SW Contact:    Tom-Johnson, Hershal Coria, RN Phone Number: 10/26/2022, 1:47 PM   Clinical Narrative:  CM spoke with patient at bedside about needs for post hospital transition. Admitted with SOB, Abdominal pain with distension and worsening Bilateral LL swelling. Completed IV abx and on oral Lasix.    From home with niece and nephew. Has two biological children and one adopted. Does not drive, Nephew Apolinar Junes transports to and from appointments. Has a cane and rollator at home.  PCP is Evelena Leyden, DO and uses [facility] on [address].   No TOC needs or recommendations noted at this time. CM will continue to follow as patient progresses with care towards discharge.    Transition of Care Asessment: Insurance and Status: Insurance coverage has been reviewed Patient has primary care physician: Yes Home environment has been reviewed: Yes Prior level of function:: Independent Prior/Current Home Services: No current home services Social Determinants of Health Reivew: SDOH reviewed no interventions necessary Readmission risk has been reviewed: Yes Transition of care needs: no transition of care needs at this time

## 2022-10-26 NOTE — Assessment & Plan Note (Addendum)
Last echocardiogram in 2022 with EF of 50 to 55%. Repeat echo with LVEF 55 to 60%, severely elevated pulmonary artery pressure; pulmonary with no further intervention.  Has had continued lower extremity swelling that improved today.  DVT ultrasound negative.  Saturating well on room air on my examination. -Strict intake and output -Daily weights -A.m. CMP, consider redose of Lasix if creatinine stable or improving

## 2022-10-26 NOTE — Assessment & Plan Note (Addendum)
Hx of heavy alcohol use. Last drink 30 years ago, unlikely recent alcohol use. RUQ cirrhosis and large volume ascites.  CT with ascites.  AST and ALT elevated. S/p TIPS procedure.  LFTs slightly more elevated this morning, and total bilirubin now 4.0 from 1.8. -Has been started on Lasix and spironolactone for cirrhosis -Will also start lactulose today and see how he tolerates tonight. -CMP, trend LFTs -Fractionated bilirubin

## 2022-10-26 NOTE — ED Notes (Signed)
ED TO INPATIENT HANDOFF REPORT  ED Nurse Name and Phone #: Baker Pierini RN [phone]  S Name/Age/Gender Donald Moreno 82 y.o. male Room/Bed: 016C/016C  Code Status   Code Status: Prior  Home/SNF/Other Home Patient oriented to: self, place, time, and situation Is this baseline? Yes   Triage Complete: Triage complete  Chief Complaint Abdominal pain [R10.9]  Triage Note Patient reports pain across his abdomen with distention and emesis this week . Bradycardic at triage .    Allergies No Known Allergies  Level of Care/Admitting Diagnosis ED Disposition     ED Disposition  Admit   Condition  --   Comment  Hospital Area: [facility] [[id number]]  Level of Care: Telemetry Medical [104]  May place patient in observation at [facility] or [facility] if equivalent level of care is available:: No  Covid Evaluation: Asymptomatic - no recent exposure (last 10 days) testing not required  Diagnosis: Abdominal pain [086578]  Admitting Physician: Levin Erp [[id number]]  Attending Physician: Doreene Eland [[id number]]          B Medical/Surgery History Past Medical History:  Diagnosis Date   Acute congestive heart failure (HCC)    Arthritis    "all over"   Asthma    Blood in stool 04/11/2017   Chronic kidney disease    "I see a kidney dr @ [facility]" (10/11/2016)   Chronic lower back pain    "turned a truck over a long time ago" (10/11/2016)   Dysrhythmia    bradycardia with occassional junctional rhythm   ETOH abuse    Gout    Hypertension    Inguinal hernia 10/28/2014   S/P inguinal hernia repair with 4L as ascites removed 03/30/17   Liver cirrhosis (HCC) 2015   Macrocytosis    Pneumonia    "long long time ago" (10/11/2016)   Spontaneous bacterial peritonitis (HCC) 07/01/2015   Past Surgical History:  Procedure Laterality Date   INGUINAL HERNIA REPAIR Bilateral 03/30/2017   Procedure: OPEN BILATERAL INGUINAL HERNIA REPAIR WITH MESH;   Surgeon: Kinsinger, De Blanch, MD;  Location: [facility];  Service: General;  Laterality: Bilateral;  GENERAL COMBINED WITH REGIONAL FOR POST OP PAIN    INSERTION OF MESH N/A 10/16/2016   Procedure: INSERTION OF MESH;  Surgeon: Kinsinger, De Blanch, MD;  Location: [facility] OR;  Service: General;  Laterality: N/A;   INSERTION OF MESH Bilateral 03/30/2017   Procedure: INSERTION OF MESH;  Surgeon: Sheliah Hatch De Blanch, MD;  Location: [facility];  Service: General;  Laterality: Bilateral;  GENERAL COMBINED WITH REGIONAL FOR POST OP PAIN    IR PARACENTESIS  08/19/2016   IR PARACENTESIS  08/26/2016   IR PARACENTESIS  09/02/2016   IR PARACENTESIS  09/09/2016   IR PARACENTESIS  09/16/2016   IR PARACENTESIS  10/03/2016   IR PARACENTESIS  10/11/2016   IR RADIOLOGIST EVAL & MGMT  09/29/2016   IR RADIOLOGIST EVAL & MGMT  12/21/2016   IR RADIOLOGIST EVAL & MGMT  01/23/2018   IR RADIOLOGIST EVAL & MGMT  02/19/2019   IR RADIOLOGIST EVAL & MGMT  03/30/2020   IR RADIOLOGIST EVAL & MGMT  03/17/2021   IR TIPS  10/11/2016   RADIOLOGY WITH ANESTHESIA N/A 10/11/2016   Procedure: TIPS;  Surgeon: Oley Balm, MD;  Location: [facility] OR;  Service: Radiology;  Laterality: N/A;   UMBILICAL HERNIA REPAIR N/A 10/16/2016   Procedure: HERNIA REPAIR UMBILICAL ADULT;  Surgeon: Kinsinger, De Blanch, MD;  Location: [facility] OR;  Service: General;  Laterality: N/A;     A IV Location/Drains/Wounds Patient Lines/Drains/Airways Status     Active Line/Drains/Airways     Name Placement date Placement time Site Days   Peripheral IV 10/26/22 22 G Right;Posterior Forearm 10/26/22  0101  Forearm  less than 1            Intake/Output Last 24 hours No intake or output data in the 24 hours ending 10/26/22 1610  Labs/Imaging Results for orders placed or performed during the hospital encounter of 10/25/22 (from the past 48 hour(s))  Lipase, blood     Status: Abnormal   Collection Time: 10/25/22 11:19 PM  Result Value Ref Range   Lipase 385 (H) 11 - 51 U/L     Comment: Performed at [facility], [address] [place], [place] [address]  Comprehensive metabolic panel     Status: Abnormal   Collection Time: 10/25/22 11:19 PM  Result Value Ref Range   Sodium 140 135 - 145 mmol/L   Potassium 4.6 3.5 - 5.1 mmol/L   Chloride 111 98 - 111 mmol/L   CO2 20 (L) 22 - 32 mmol/L   Glucose, Bld 91 70 - 99 mg/dL    Comment: Glucose reference range applies only to samples taken after fasting for at least 8 hours.   BUN 31 (H) 8 - 23 mg/dL   Creatinine, Ser 4.09 (H) 0.61 - 1.24 mg/dL   Calcium 8.9 8.9 - 81.1 mg/dL   Total Protein 6.7 6.5 - 8.1 g/dL   Albumin 3.1 (L) 3.5 - 5.0 g/dL   AST 914 (H) 15 - 41 U/L   ALT 50 (H) 0 - 44 U/L   Alkaline Phosphatase 143 (H) 38 - 126 U/L   Total Bilirubin 1.8 (H) 0.3 - 1.2 mg/dL   GFR, Estimated 31 (L) >60 mL/min    Comment: (NOTE) Calculated using the CKD-EPI Creatinine Equation (2021)    Anion gap 9 5 - 15    Comment: Performed at [facility], [address] [place], [place] [address]  CBC     Status: Abnormal   Collection Time: 10/25/22 11:19 PM  Result Value Ref Range   WBC 5.9 4.0 - 10.5 K/uL   RBC 3.06 (L) 4.22 - 5.81 MIL/uL   Hemoglobin 10.3 (L) 13.0 - 17.0 g/dL   HCT 62.1 (L) 30.8 - 65.7 %   MCV 100.3 (H) 80.0 - 100.0 fL   MCH 33.7 26.0 - 34.0 pg   MCHC 33.6 30.0 - 36.0 g/dL   RDW 84.6 96.2 - 95.2 %   Platelets 153 150 - 400 K/uL    Comment: REPEATED TO VERIFY   nRBC 0.0 0.0 - 0.2 %    Comment: Performed at [facility], [address] [place], [place] [address]  Brain natriuretic peptide     Status: Abnormal   Collection Time: 10/25/22 11:19 PM  Result Value Ref Range   B Natriuretic Peptide 1,040.1 (H) 0.0 - 100.0 pg/mL    Comment: Performed at [facility], [address] [place], [place] [address]  Ammonia     Status: Abnormal   Collection Time: 10/26/22 12:56 AM  Result Value Ref Range   Ammonia 50 (H) 9 - 35 umol/L    Comment: HEMOLYSIS AT THIS LEVEL MAY  AFFECT RESULT Performed at [facility], [address] [place], [place] [address]   Protime-INR     Status: Abnormal   Collection  Time: 10/26/22 12:56 AM  Result Value Ref Range   Prothrombin Time 17.6 (H) 11.4 - 15.2 seconds   INR 1.4 (H) 0.8 - 1.2    Comment: (NOTE) INR goal varies based on device and disease states. Performed at [facility], [address], [place], [place] [address]    DG ABD ACUTE 2+V W 1V CHEST  Result Date: 10/26/2022 CLINICAL DATA:  Abdominal distension EXAM: DG ABDOMEN ACUTE WITH 1 VIEW CHEST COMPARISON:  06/09/2021 FINDINGS: Cardiomegaly, vascular congestion. Bibasilar atelectasis or infiltrates, right greater than left. No visible effusions. No acute bony abnormality. Aortic atherosclerosis. IMPRESSION: Cardiomegaly, vascular congestion. Bibasilar atelectasis or infiltrates. Electronically Signed   By: Charlett Nose M.D.   On: 10/26/2022 02:22   CT ABDOMEN PELVIS WO CONTRAST  Result Date: 10/26/2022 CLINICAL DATA:  Bowel obstruction suspected EXAM: CT ABDOMEN AND PELVIS WITHOUT CONTRAST TECHNIQUE: Multidetector CT imaging of the abdomen and pelvis was performed following the standard protocol without IV contrast. RADIATION DOSE REDUCTION: This exam was performed according to the departmental dose-optimization program which includes automated exposure control, adjustment of the mA and/or kV according to patient size and/or use of iterative reconstruction technique. COMPARISON:  10/04/2020 FINDINGS: Lower chest: Small bilateral pleural effusions. Bibasilar airspace opacities, likely atelectasis. Hepatobiliary: Numerous gallstones layering within the gallbladder. No visible biliary ductal dilatation. Small central cyst measuring 1.4 cm, stable. Prior tips. Pancreas: No focal abnormality or ductal dilatation. Spleen: No focal abnormality.  Normal size. Adrenals/Urinary Tract: 1 cm low-density lesion in the lower pole of the right kidney is stable since prior  study, likely cyst. No follow-up imaging recommended. No stones or hydronephrosis. Adrenal glands and urinary bladder unremarkable. Stomach/Bowel: Stomach, large and small bowel grossly unremarkable. Vascular/Lymphatic: Aortic atherosclerosis. No evidence of aneurysm or adenopathy. Reproductive: Mildly prominent prostate Other: Large volume ascites in the abdomen and pelvis, new since prior study. Musculoskeletal: No acute bony abnormality. IMPRESSION: Prior tips within the liver. Large volume ascites in the abdomen and pelvis. Cholelithiasis. Aortic atherosclerosis. Electronically Signed   By: Charlett Nose M.D.   On: 10/26/2022 02:12   US Abdomen Limited RUQ (LIVER/GB)  Result Date: 10/26/2022 CLINICAL DATA:  Abdominal pain. EXAM: ULTRASOUND ABDOMEN LIMITED RIGHT UPPER QUADRANT COMPARISON:  None Available. FINDINGS: Gallbladder: Multiple shadowing, echogenic gallstones are seen within the gallbladder lumen. The largest measures approximately 1.1 cm. The gallbladder wall measures 3.3 mm in thickness. No sonographic Murphy sign noted by sonographer. Common bile duct: Diameter: 6.1 mm Liver: No focal lesion identified. The liver parenchyma is nodular in echotexture and diffusely increased in echogenicity. Portal vein is patent on color Doppler imaging with normal direction of blood flow towards the liver. Other: A large amount of ascites is present. IMPRESSION: 1. Cholelithiasis, without evidence of acute cholecystitis. 2. Hepatic steatosis and hepatic cirrhosis without focal liver lesions. 3. Ascites. Electronically Signed   By: Aram Candela M.D.   On: 10/26/2022 01:41    Pending Labs Unresulted Labs (From admission, onward)     Start     Ordered   10/25/22 2308  Urinalysis, Routine w reflex microscopic -Urine, Clean Catch  Once,   URGENT       Question:  Specimen Source  Answer:  Urine, Clean Catch   10/25/22 2308            Vitals/Pain Today's Vitals   10/26/22 0315 10/26/22 0330 10/26/22  0435 10/26/22 0530  BP: (!) 157/67  (!) 158/68 (!) 152/59  Pulse: (!) 45  (!) 50  Resp: 16  17 15   Temp:      TempSrc:      SpO2: 100%  100%   PainSc:  Asleep      Isolation Precautions No active isolations  Medications Medications  cefTRIAXone (ROCEPHIN) 1 g in sodium chloride 0.9 % 100 mL IVPB (0 g Intravenous Stopped 10/26/22 0134)  fentaNYL (SUBLIMAZE) injection 50 mcg (50 mcg Intravenous Given 10/26/22 0103)  ondansetron (ZOFRAN) injection 4 mg (4 mg Intravenous Given 10/26/22 0102)  furosemide (LASIX) injection 40 mg (40 mg Intravenous Given 10/26/22 0434)  lidocaine-EPINEPHrine (XYLOCAINE W/EPI) 2 %-1:200000 (PF) injection 10 mL (10 mLs Intradermal Given 10/26/22 0434)    Mobility walks with device     Focused Assessments Cardiac Assessment Handoff:  Cardiac Rhythm: Sinus bradycardia Lab Results  Component Value Date   TROPONINI <0.03 02/19/2018   No results found for: "DDIMER" Does the Patient currently have chest pain? No    R Recommendations: See Admitting Provider Note  Report given to:   Additional Notes: pt is a good historian and uses call light appropriately .

## 2022-10-26 NOTE — Assessment & Plan Note (Addendum)
Hypertensive to 160s systolic while in hospital ON.  Heart rate a little soft with current dose of bisoprolol, will decrease.  Will also monitor blood pressure with above diuretics. -Continue home amlodipine -Continue home bisoprolol at reduced dose to 25 mg -Continue Lasix, spironolactone

## 2022-10-26 NOTE — Plan of Care (Signed)

## 2022-10-26 NOTE — ED Notes (Signed)
Pt has 3+ pitting edema in feet shoes removed for comfort and pt asked for feet to be elevated as well. Pt resting comfortably

## 2022-10-26 NOTE — Progress Notes (Signed)
FMTS Interim Progress Note  S: Saw patient briefly after admission this morning.  He is feeling much better after IR paracentesis.  O: BP (!) 156/73 (BP Location: Left Arm)   Pulse 69   Temp 98.1 F (36.7 C) (Oral)   Resp 18   Ht 5\' 10"  (1.778 m)   Wt 78.7 kg   SpO2 100%   BMI 24.89 kg/m   General: Alert and oriented, in NAD Skin: Warm, dry, and intact HEENT: NCAT, EOM grossly normal, midline nasal septum Cardiac: RRR, no m/r/g appreciated Respiratory: CTAB, breathing and speaking comfortably on RA Abdominal: Soft, nontender, nondistended, normoactive bowel sounds Extremities: Moves all extremities grossly equally in bed Neurological: No gross focal deficit Psychiatric: Appropriate mood and affect   A/P: Abdominal pain likely secondary to cirrhotic ascites Most likely from abdominal distention from fluid overload given improvement after IR paracentesis.  He has also had improvement with Lasix.  Will continue Lasix at 20 mg and add spironolactone 25 mg.  No indication for SBP coverage given PMN count less than 250. Will need follow-up with TIPS clinic at discharge.  Continue home bisoprolol.  Hepatitis panel negative, hepatitis B surface antigen pending.  CHF Status post Lasix.  Adding on spironolactone as above.  No DVT on preliminary DVT ultrasound report of right leg.  Repeat echo with LVEF 55 to 60%, severely elevated pulmonary artery pressure with elevated right atrial pressure, patent left atrial appendage, mildly dilated left atrium, mild mitral valve regurgitation, moderate to severe tricuspid valve regurgitation, trivial aortic valve regurgitation.  Given evidence of PAH, will consult pulmonology.  CKD stage IIIb Baseline creatinine around 2.  2.13 today after diuresis.  Follows with [facility].  Will continue to monitor while continuing diuretics for cirrhotic ascites.  Chronic macrocytic anemia Folate and vitamin B12 normal.  TIBC 204, sat ratio 26, ferritin 772.   Reticulocyte count 32.3.  Less likely due to alcoholism given last drink was 30 years ago.  Less likely myelodysplastic syndrome given otherwise unremarkable cell lines.  Less likely hypothyroidism given normal TSH in 2022, and do not feel like rechecking here in the hospital will be of much utility given he is acutely ill. Most likely explanation is known liver disease.  He will need follow-up outpatient.  Anticipate speedy discharge if he continues to improve.  Janeal Holmes, MD 10/26/2022, 4:10 PM PGY-1, [facility] pager [phone]

## 2022-10-26 NOTE — Assessment & Plan Note (Addendum)
Rates ranging 40s to 50s.  Longstanding bradycardia documented in chart.  Not on any anticoagulation likely in the setting of history of esophageal varices. -Continue home bisoprolol at reduced dose 2.5 mg given lower heart rate as above -Cardiac monitoring

## 2022-10-26 NOTE — Assessment & Plan Note (Addendum)
Resolved.  Likely due to ascites that has been removed by IR. -MiraLAX for bowel regimen -Clear liquid diet, advance as tolerated -Tylenol as needed -AM CMP, Mag, CBC

## 2022-10-26 NOTE — Care Management Obs Status (Signed)
MEDICARE OBSERVATION STATUS NOTIFICATION   Patient Details  Name: Donald Moreno MRN: [id number] Date of Birth: 24-Aug-1940   Medicare Observation Status Notification Given:  Yes    Tom-Johnson, Hershal Coria, RN 10/26/2022, 2:04 PM

## 2022-10-26 NOTE — Progress Notes (Signed)
New Admission Note:   Arrival Method: Bed Mental Orientation: Alert and OrientedX4 Telemetry: ordered Assessment: Completed Skin: intact IV: Right Posterior forearm Pain:0/10 Tubes: none Safety Measures: Safety Fall Prevention Plan has been given, discussed and signed Admission: Completed [facility] Orientation: Patient has been orientated to the room, unit and staff.  Family: none bedside  Orders have been reviewed and implemented. Will continue to monitor the patient. Call light has been placed within reach and bed alarm has been activated.   Stacie Glaze LPN [facility] Renal Phone: [phone]

## 2022-10-26 NOTE — Assessment & Plan Note (Deleted)
Resolved on my examination.  DDx detailed above-most likely pancreatitis versus pain from abdominal distention from fluid overload.  Patient received ceftriaxone x 1 for SBP workup.  Will obtain IR paracentesis for further labs although it is reassuring that patient is not peritonitic on my examination.  Lasix IV 40 x 1 and has had symptomatic improvement after this.  Possible pancreatitis although imaging does not show it could be a mild case.  No current alcohol use in the past 30 years.  Will trial patient on clear liquid diet and advance as tolerated. -Admit to FMTS, med/tele, attending Dr. Lum Babe -Monitor fever curve, consider continued antibiotics for SBP prophylaxis -MiraLAX for bowel regimen -Clear liquid diet, advance as tolerated -Tylenol as needed -AM CMP, Mag, CBC

## 2022-10-26 NOTE — Assessment & Plan Note (Addendum)
Creatinine 2.11 on admission.  Now 2.08 today.  Recent baseline from 2.0-2.4. -Monitor creatinine after Lasix dose -A.m. BMP -Avoid nephrotoxic agents

## 2022-10-26 NOTE — Hospital Course (Addendum)
Donald Moreno is a 82 y.o. male who was admitted to the [facility] Medicine Teaching Service at [facility] for abdominal pain in the setting of alcoholic liver cirrhosis and ascites. Hospital course is outlined below by problem.   Ascites in the setting of alcoholic liver cirrhosis Presented with abdominal pain and tense, distended abdomen.  Liver enzymes elevated with negative hepatitis panel.  Lipase elevated likely in setting of liver disease. CT abdomen, ultrasound, and x-ray with large ascites; no evidence of pancreatitis.  He received IV Lasix 40 mg in the ED.  IR was consulted who performed therapeutic and diagnostic paracentesis with improvement in pain.  Fluid studies with PMN less than 250, no antibiotics were started.  Other studies demonstrated Gram stain with no WBC or organisms, culture with no growth in <24 hrs. he did have an episode of recurrent pain over admission for which KUB EKG and tropes were unremarkable.  Repeat right upper quadrant sound and ultrasound TIPS demonstrated some wall thickening with multiple gallstones, no evidence of acute cholecystitis. GI was consulted who recommended HIDA scan with ejection fraction which showed***. He was referred to TIPS clinic at discharge.  CHF Presented with bilateral lower limb edema worse on the right.  BMP 1040.1.  Lasix administered as above.  DVT ultrasound obtained which was negative for acute DVT.  Echo in 2022 with EF of 50 to 55%, repeat echo here with EF of 55 to 60%.  He was on amlodipine for hypertension, though this was discontinued due to worsening edema.  Home bisoprolol continued at reduced dose 2.5 given softer rates in the hospital to 40-50s.  Pulmonary arterial hypertension Found on echo.  Appears concern for this for a while.  Pulmonology consulted who recommended no further workup given he is not a candidate for vasodilator therapy given advanced cirrhosis; will advise diuresis as renal function permits and evaluate for desat with  ambulation at time of discharge which was ***.  CKD stage IIIb Baseline creatinine around 2, presented with mildly increased to 2.13.  Follows with [facility] per records.  He received diuresis as above.  By discharge, his creatinine was ***.  Chronic macrocytic anemia Hemoglobin 10.3 and MCV 100.3 on admission.  Folate and vitamin B12 normal. TIBC 204, sat ratio 26, ferritin 772. Reticulocyte count 32.3. Less likely due to alcoholism given last drink was 30 years ago. Less likely myelodysplastic syndrome given otherwise unremarkable cell lines. Less likely hypothyroidism given normal TSH in 2022, and do not feel like rechecking here in the hospital will be of much utility given he is acutely ill. Most likely explanation is known liver disease.  Hemoglobin remained stable over admission***.  Chronic conditions Gout-continued allopurinol Asthma-continued montelukast, continued [place] formulary equivalent Avascular necrosis of left hip-continued tramadol as needed, vitamin D3 Esophageal varices-continued bisoprolol, added Lovenox for VTE prophylaxis Hx of alcohol use-no drink in 30 years, did not place on CIWAs  Issues for follow up: Follow-up volume and ascites status on spironolactone and Lasix Ensure follow-up with pulmonology for [facility] Ensure follow-up for CKD Monitor chronic microcytic anemia Assess follow-up with TIPS clinic Recommend BMP in 1 week f/u

## 2022-10-26 NOTE — Consult Note (Signed)
NAME:  Donald Moreno, MRN:  [id number], DOB:  [birth year], LOS: 0 ADMISSION DATE:  10/25/2022, CONSULTATION DATE:  10/26/2022 REFERRING MD:  Lelon Mast, CHIEF COMPLAINT:  PAH per ECHO   History of Present Illness:  Donald Moreno is a 82 y.o. male with a past medical history significant for HFpEF, HTN, HLD, alcoholic liver cirrhosis requiring multiple paracenteses with SBP in the past, CKD stage IV, a flutter, and asthma who presented to the emergency department 10/25/2022 for complaints of abdominal pain with associated ascites.  Patient was admitted per FMTS for management of abdominal pain and likely need of paracenteses.  ECHO obtained and revealed EF 55 to 60% with no WMA, RV function normal, severe elevated pulmonary artery systolic pressure.  Pulmonary consulted for assistance in workup of PAH.   Pertinent  Medical History  HFpEF, HTN, HLD, alcoholic liver cirrhosis requiring multiple paracenteses with SBP in the past, CKD stage IV, a flutter, and asthma  Significant Hospital Events: Including procedures, antibiotic start and stop dates in addition to other pertinent events   6/12 presented for complaints of abdominal pain echo obtained and revealed severely elevated pulmonary artery systolic pressure, pulmonary consulted  Interim History / Subjective:  No dyspnea Complains of leg edema  Objective   Blood pressure (!) 156/73, pulse 69, temperature 98.1 F (36.7 C), temperature source Oral, resp. rate 18, height 5\' 10"  (1.778 m), weight 78.7 kg, SpO2 100 %.        Intake/Output Summary (Last 24 hours) at 10/26/2022 1701 Last data filed at 10/26/2022 1600 Gross per 24 hour  Intake 1000 ml  Output 700 ml  Net 300 ml   Filed Weights   10/26/22 0820  Weight: 78.7 kg    Examination: General: Chronically ill appearing elderly man, in NAD, out of bed to chair HEENT: ETT, MM pink/moist, PERRL,  Neuro: Alert, interactive, nonfocal, no asterixis CV: s1s2 regular rate and rhythm, loud P2, no  murmur, rubs, Donald gallops,  PULM: Decreased breath sounds bilaterally, no rhonchi, no accessory muscle use GI: soft, bowel sounds active in all 4 quadrants, non-tender, non-distended,  Extremities: warm/dry, 2+ edema  Skin: no rashes Donald lesions  Labs show BUN/creatinine 31/2.1  Resolved Hospital Problem list     Assessment & Plan:  Severe pulmonary hypertension, likely secondary in this 82 year old man - ECHO with HFpEF and revealed EF 55 to 60% with no WMA, RV function normal, severe elevated pulmonary artery systolic pressure.   Review of previous echo shows pulmonary hypertension dating back to 2018.  His cardiologist was aware of this and attributed it to portal pulmonary hypertension.  He has never had right heart cath History of cirrhosis attributed to EtOH but he reports quitting drinking 30 years ago.  No reason to suspect CTEPH Donald constrictive pericarditis P: Workup for pulmonary hypertension would include VQ scan to rule out CTEPH and right heart cath -Given that this is longstanding and his cardiologist was aware of it, I doubt that he is a candidate for vasodilator therapy given his advanced liver cirrhosis, hence I doubt that further workup is necessary Donald helpful to him.  Pulmonary hypertension may be secondary to HFpEF Donald porto- pulmonary -Would advise diuresis as renal function permits -Evaluate for desaturation with ambulation at time of discharge after diuresis  PCCM will be available as needed, outpatient follow-up can be with cardiology  Best Practice (right click and "Reselect all SmartList Selections" daily)  Per Primary   Labs   CBC: Recent Labs  Lab 10/25/22  2319  WBC 5.9  HGB 10.3*  HCT 30.7*  MCV 100.3*  PLT 153    Basic Metabolic Panel: Recent Labs  Lab 10/25/22 2319 10/26/22 0717  NA 140 139  K 4.6 4.7  CL 111 107  CO2 20* 19*  GLUCOSE 91 93  BUN 31* 31*  CREATININE 2.11* 2.13*  CALCIUM 8.9 9.0  MG  --  2.4   GFR: Estimated Creatinine  Clearance: 28.6 mL/min (A) (by C-G formula based on SCr of 2.13 mg/dL (H)). Recent Labs  Lab 10/25/22 2319  WBC 5.9    Liver Function Tests: Recent Labs  Lab 10/25/22 2319  AST 121*  ALT 50*  ALKPHOS 143*  BILITOT 1.8*  PROT 6.7  ALBUMIN 3.1*   Recent Labs  Lab 10/25/22 2319  LIPASE 385*   Recent Labs  Lab 10/26/22 0056  AMMONIA 50*    ABG No results found for: "PHART", "PCO2ART", "PO2ART", "HCO3", "TCO2", "ACIDBASEDEF", "O2SAT"   Coagulation Profile: Recent Labs  Lab 10/26/22 0056  INR 1.4*    Cardiac Enzymes: No results for input(s): "CKTOTAL", "CKMB", "CKMBINDEX", "TROPONINI" in the last 168 hours.  HbA1C: Hgb A1c MFr Bld  Date/Time Value Ref Range Status  10/04/2020 06:29 PM 5.3 4.8 - 5.6 % Final    Comment:    (NOTE) Pre diabetes:          5.7%-6.4%  Diabetes:              >6.4%  Glycemic control for   <7.0% adults with diabetes   09/10/2008 10:01 AM 5.1 %     CBG: No results for input(s): "GLUCAP" in the last 168 hours.  Review of Systems:   Please see the history of present illness. All other systems reviewed and are negative   Past Medical History:  He,  has a past medical history of Acute congestive heart failure (HCC), Arthritis, Asthma, Blood in stool (04/11/2017), Chronic kidney disease, Chronic lower back pain, Dysrhythmia, ETOH abuse, Gout, Hypertension, Inguinal hernia (10/28/2014), Liver cirrhosis (HCC) (2015), Macrocytosis, Pneumonia, and Spontaneous bacterial peritonitis (HCC) (07/01/2015).   Surgical History:   Past Surgical History:  Procedure Laterality Date   INGUINAL HERNIA REPAIR Bilateral 03/30/2017   Procedure: OPEN BILATERAL INGUINAL HERNIA REPAIR WITH MESH;  Surgeon: Kinsinger, De Blanch, MD;  Location: [facility];  Service: General;  Laterality: Bilateral;  GENERAL COMBINED WITH REGIONAL FOR POST OP PAIN    INSERTION OF MESH N/A 10/16/2016   Procedure: INSERTION OF MESH;  Surgeon: Kinsinger, De Blanch, MD;  Location: [facility];  Service: General;  Laterality: N/A;   INSERTION OF MESH Bilateral 03/30/2017   Procedure: INSERTION OF MESH;  Surgeon: Sheliah Hatch De Blanch, MD;  Location: [facility];  Service: General;  Laterality: Bilateral;  GENERAL COMBINED WITH REGIONAL FOR POST OP PAIN    IR PARACENTESIS  08/19/2016   IR PARACENTESIS  08/26/2016   IR PARACENTESIS  09/02/2016   IR PARACENTESIS  09/09/2016   IR PARACENTESIS  09/16/2016   IR PARACENTESIS  10/03/2016   IR PARACENTESIS  10/11/2016   IR PARACENTESIS  10/26/2022   IR RADIOLOGIST EVAL & MGMT  09/29/2016   IR RADIOLOGIST EVAL & MGMT  12/21/2016   IR RADIOLOGIST EVAL & MGMT  01/23/2018   IR RADIOLOGIST EVAL & MGMT  02/19/2019   IR RADIOLOGIST EVAL & MGMT  03/30/2020   IR RADIOLOGIST EVAL & MGMT  03/17/2021   IR TIPS  10/11/2016   RADIOLOGY WITH ANESTHESIA N/A 10/11/2016   Procedure:  TIPS;  Surgeon: Oley Balm, MD;  Location: [facility];  Service: Radiology;  Laterality: N/A;   UMBILICAL HERNIA REPAIR N/A 10/16/2016   Procedure: HERNIA REPAIR UMBILICAL ADULT;  Surgeon: Kinsinger, De Blanch, MD;  Location: [facility];  Service: General;  Laterality: N/A;     Social History:   reports that he has never smoked. He has been exposed to tobacco smoke. His smokeless tobacco use includes snuff and chew. He reports that he does not drink alcohol and does not use drugs.   Family History:  His family history includes Asthma in his brother, brother, sister, and son; Heart disease in his sister; Hypertension in his brother and brother; Other in his father and mother.   Allergies No Known Allergies   Home Medications  Prior to Admission medications   Medication Sig Start Date End Date Taking? Authorizing Provider  allopurinol (ZYLOPRIM) 100 MG tablet TAKE 1/2 TABLET BY MOUTH EVERY DAY Patient taking differently: Take 50 mg by mouth daily. 06/16/22  Yes Lilland, Alana, DO  amLODipine (NORVASC) 5 MG tablet Take 1 tablet (5 mg total) by mouth daily. 05/12/22  Yes Croitoru, Mihai, MD   bisoprolol (ZEBETA) 5 MG tablet Take 1 tablet by mouth daily 04/27/22  Yes Croitoru, Mihai, MD  Cholecalciferol (VITAMIN D-3) 5000 units TABS Take 1,000 Units by mouth daily.   Yes [provider]  montelukast (SINGULAIR) 10 MG tablet TAKE 1 TABLET BY MOUTH EVERYDAY AT BEDTIME Patient taking differently: Take 10 mg by mouth at bedtime. 07/15/22  Yes Lilland, Alana, DO  traMADol (ULTRAM) 50 MG tablet TAKE 1 TABLET BY MOUTH EVERY DAY AT BEDTIME AS NEEDED 10/11/22  Yes Lilland, Alana, DO  WIXELA INHUB 500-50 MCG/ACT AEPB TAKE 1 PUFF BY MOUTH TWICE A DAY Patient taking differently: Inhale 1 puff into the lungs in the morning and at bedtime. 08/17/22  Yes Littie Deeds, MD  albuterol [facility] HFA) 108 (90 Base) MCG/ACT inhaler Inhale 2 puffs into the lungs every 4 (four) hours as needed for wheezing. Patient not taking: Reported on 04/27/2022 05/24/18   Shirley, [place], DO  furosemide (LASIX) 40 MG tablet TAKE 1 TAB 3 TIMES A DAY FOR WEIGHT >168LB 2 TIMES A DAY FOR WEIGHT 163-168LB ONCE DAILY FOR WEIGHT <163LB 12/24/18 07/20/19  Shirley, [place], DO     Cyril Mourning MD. FCCP. [facility] Pager : [phone]  If no response to pager , please call [phone] until 7 pm After 7:00 pm call Elink  [phone]    10/26/2022, 6:00 PM

## 2022-10-26 NOTE — H&P (Addendum)
Hospital Admission History and Physical Service Pager: [phone]  [name] Medical record number: [id number] Date of Birth: 08/08/1940 Age: 82 y.o. Gender: male  Primary Care Provider: Evelena Leyden, DO Consultants: IR Code Status: DNR/DNI, discussed with patient.  Preferred Emergency Contact:  Contact Information     Name Relation Home Work Mobile   Neal,[name] Relative [phone]   Neal,Geneive Niece [phone]  [phone]   Clearance Coots   [phone]       Chief Complaint: stomach pain  Assessment and Plan: Nickolaos Brallier is a 82 y.o. male PMH HTN, asthma, CKD 4, a flutter, alcoholic cirrhosis, past, anemia, AVN L hip, esophageal varices presenting with abdominal pain. Differential for this patient's presentation of this includes pancreatitis given elevated lipase, SBP given ascites/cirrhosis however not peritonitic on examination and abdominal pain resolved, pain from fluid distention from heart failure/cirrhosis, constipation-last bowel movement yesterday morning and sometimes it is hard.  Active Hospital Problems   *Pancreatitis, alcoholic, acute    Abdominal pain           Resolved on my examination.  DDx detailed           above-most likely pancreatitis versus pain from           abdominal distention from fluid overload.  Patient           received ceftriaxone x 1 for SBP workup.  Will           obtain IR paracentesis for further labs although           it is reassuring that patient is not peritonitic           on my examination.  Lasix IV 40 x 1 and has had           symptomatic improvement after this.  Possible           pancreatitis although imaging does not show it           could be a mild case.  No current alcohol use in           the past 30 years.  Will trial patient on clear           liquid diet and advance as tolerated.           -Admit to FMTS, med/tele, attending Dr. Lum Babe            -Monitor fever curve, consider continued           antibiotics for SBP prophylaxis           -MiraLAX for bowel regimen           -Clear liquid diet, advance as tolerated           -Tylenol as needed           -AM CMP, Mag, CBC    Chronic heart failure with preserved ejection fraction (HFpEF) (HCC)           Last echocardiogram in 2022 with EF of 50 to 55%.            Has had continued lower extremity swelling.  3+ up           to knees bilaterally worse on the right side           although does have significant history of venous           insufficiency.  Will obtain Dopplers to  rule out           DVT.  Will obtain new echocardiogram given           elevated BNP to 1000's.  Saturating well on room           air on my examination although intermittently           needed 3 L of oxygen.  Denies any orthopnea           however is sitting slightly elevated on my           examination.           -Echocardiogram           -DVT ultrasound           -Strict intake and output           -Daily weights           -A.m. BMP, consider redose of Lasix if creatinine           stable or improving    Cirrhosis of liver (HCC)           Hx of heavy alcohol use. Last drink 30 years ago,           unlikely recent alcohol use. RUQ cirrhosis and           large volume ascites. AST and ALT elevated. S/p            TIPS procedure.            -IR paracentesis to r/o SBP           -CMP, trend LFTs               Typical atrial flutter (HCC)           Rates ranging 40s to 50s.  Longstanding           bradycardia documented in chart.  Not on any           anticoagulation likely in the setting of history           of esophageal varices.           -Continue home bisoprolol           -Cardiac monitoring    CKD (chronic kidney disease) stage 4, GFR 15-29 ml/min (HCC)           Creatinine 2.11 on admission.  Recent baseline           from 2.0-2.4.           -Monitor creatinine after Lasix dose            -A.m. BMP           -Avoid nephrotoxic agents    HYPERTENSION, BENIGN SYSTEMIC        Chronic           Hypertensive to 170s systolic while in hospital.           -Continue home amlodipine           -Continue home bisoprolol   Resolved Hospital Problems No resolved problems to display.  Chronic conditions Gout-continue allopurinol Asthma-continue montelukast, continue Wixela formulary equivalent Avascular necrosis of left hip-continue tramadol as needed, vitamin D3 Esophageal varices-held pharmacologic VTE prophylaxis Hx of alcohol use-no drink in 30 years, did not place on CIWAs  FEN/GI: Clear liquid diet VTE Prophylaxis: SCDs  Disposition: Med-Surg  History of Present Illness:  Donald  Moreno is a 82 y.o. male presenting with stomach pain.   Came in to hospital yesterday due to stomach pain that started last night. Woke up last night with shooting pain is stomach. All across stomach not in specific spot. Feels stomach is more swollen as well. Last bowel movement was yesterday morning, sometimes has hard stools. No vomiting or diarrhea. No nausea. Took 3 walks yesterday as usual. No known fevers at home. No chest pain. Rare coughing. Feels breathing has been normal.   Has not had paracentesis recently. Brother passed away recently (1 month ago). No orthopnea or paroxysmal nocturnal dyspnea. Does endorses chronic swelling of lower legs.  In the ED, vitals were notable for HR 50, BP 158/68. Lipase was 385, BNP 1040.1. CT Abdomen showed ascites, and cholelithiasis. Patient received 1g Ceftriaxone, 40mg  Lasix, Zofran and Fentanyl.   Review Of Systems: Per HPI.  Pertinent Past Medical History: CHF Alcoholic Cirrhosis Ascites HTN CKD IV Atrial Flutter Remainder reviewed in history tab.   Pertinent Past Surgical History: S/p TIPS procedure Multiple paracentesis Umbilical hernia repair   Remainder reviewed in history tab.  Pertinent Social History: Tobacco use: did not  smoke, still dips daily since age 56 Alcohol use: Last drank alcohol 30 years ago, drank whiskey daily Other Substance use: No other drug use Lives with niece and nephew. Has walker at home, but does not usually have to use.  Pertinent Family History:  Reviewed in history tab.   Important Outpatient Medications: Albuterol Allopurinol Amlodipine Bisoprolol Singulair Tramadol Wixela  Remainder reviewed in medication history.   Objective: BP (!) 156/73 (BP Location: Left Arm)   Pulse 69   Temp 98.1 F (36.7 C) (Oral)   Resp 18   SpO2 100%  Exam: General: NAD, alert and oriented to person, place, situation (unable to correctly name year says that it is 1924, says that "white dude" is the president) Eyes: Pupils equal and reactive to light, sclera icteric ENTM: Moist mucous membranes, no obvious trauma Neck: Range of motion intact Cardiovascular: Normal rate, no murmurs or gallops auscultated, 2+ radial pulses bilaterally,  Respiratory: Clear to auscultation bilaterally, no wheezes rales or crackles, no increased work of breathing on room air Gastrointestinal: Distended, nontender to palpation except MILD tenderness to deep palpation of LUQ, fluid wave present, tympanic, bowel sounds moderately hyperactive, no CVA tenderness MSK: 3+ pitting edema to knees/upper thighs bilaterally R>L, nontender to palpation of calves however does  have some pain with bending right leg Derm: No foot ulcers seen on examination, onychomycoses present, scarring on abdomen from prior TIPS procedure/abdominal surgery Neuro: CN II: PERRL CN III, IV,VI: EOMI CV V: Normal sensation in V1, V2, V3 CVII: Symmetric smile and brow raise CN VIII: Normal hearing CN IX,X: Symmetric palate raise  CN XI: 5/5 shoulder shrug CN XII: Symmetric tongue protrusion  UE and LE strength 5/5 Normal sensation in UE and LE bilaterally  Psych: Mood pleasant  Labs:  CBC BMET  Recent Labs  Lab 10/25/22 2319  WBC 5.9   HGB 10.3*  HCT 30.7*  PLT 153   Recent Labs  Lab 10/25/22 2319  NA 140  K 4.6  CL 111  CO2 20*  BUN 31*  CREATININE 2.11*  GLUCOSE 91  CALCIUM 8.9      Lipase was 385 BNP 1040.1 Ammonia 50 INR 1.4  EKG: Atrial Flutter  Imaging Studies Performed:  Acute Abd XR and CXR: Cardiomegaly, vascular congestion. Bibasilar atelectasis or infiltrates.  CT Abdomen/Pel: Prior tips within  the liver. Large volume ascites in the abdomen and pelvis. Cholelithiasis. Aortic atherosclerosis.  RUQ [place] 1. Cholelithiasis, without evidence of acute cholecystitis. 2. Hepatic steatosis and hepatic cirrhosis without focal liver lesions. 3. Ascites.   Levin Erp, MD 10/26/2022, 7:07 AM PGY-1, [facility]  FPTS Intern pager: [phone], text pages welcome Secure chat group [facility] Teaching Service

## 2022-10-27 ENCOUNTER — Other Ambulatory Visit (HOSPITAL_COMMUNITY): Payer: Self-pay

## 2022-10-27 LAB — BILIRUBIN, FRACTIONATED(TOT/DIR/INDIR)
Bilirubin, Direct: 2.1 mg/dL — ABNORMAL HIGH (ref 0.0–0.2)
Indirect Bilirubin: 1.7 mg/dL — ABNORMAL HIGH (ref 0.3–0.9)
Total Bilirubin: 3.8 mg/dL — ABNORMAL HIGH (ref 0.3–1.2)

## 2022-10-27 LAB — CBC
HCT: 33.3 % — ABNORMAL LOW (ref 39.0–52.0)
Hemoglobin: 11.2 g/dL — ABNORMAL LOW (ref 13.0–17.0)
MCH: 33.9 pg (ref 26.0–34.0)
MCHC: 33.6 g/dL (ref 30.0–36.0)
MCV: 100.9 fL — ABNORMAL HIGH (ref 80.0–100.0)
Platelets: 139 10*3/uL — ABNORMAL LOW (ref 150–400)
RBC: 3.3 MIL/uL — ABNORMAL LOW (ref 4.22–5.81)
RDW: 13.8 % (ref 11.5–15.5)
WBC: 5.1 10*3/uL (ref 4.0–10.5)
nRBC: 0 % (ref 0.0–0.2)

## 2022-10-27 LAB — COMPREHENSIVE METABOLIC PANEL
ALT: 72 U/L — ABNORMAL HIGH (ref 0–44)
AST: 104 U/L — ABNORMAL HIGH (ref 15–41)
Albumin: 2.9 g/dL — ABNORMAL LOW (ref 3.5–5.0)
Alkaline Phosphatase: 242 U/L — ABNORMAL HIGH (ref 38–126)
Anion gap: 7 (ref 5–15)
BUN: 27 mg/dL — ABNORMAL HIGH (ref 8–23)
CO2: 20 mmol/L — ABNORMAL LOW (ref 22–32)
Calcium: 8.7 mg/dL — ABNORMAL LOW (ref 8.9–10.3)
Chloride: 108 mmol/L (ref 98–111)
Creatinine, Ser: 2.08 mg/dL — ABNORMAL HIGH (ref 0.61–1.24)
GFR, Estimated: 32 mL/min — ABNORMAL LOW (ref >60)
Glucose, Bld: 87 mg/dL (ref 70–99)
Potassium: 4.5 mmol/L (ref 3.5–5.1)
Sodium: 135 mmol/L (ref 135–145)
Total Bilirubin: 4 mg/dL — ABNORMAL HIGH (ref 0.3–1.2)
Total Protein: 6.5 g/dL (ref 6.5–8.1)

## 2022-10-27 LAB — PATHOLOGIST SMEAR REVIEW: Path Review: NEGATIVE

## 2022-10-27 LAB — HEPATITIS B SURFACE ANTIBODY, QUANTITATIVE: Hep B S AB Quant (Post): <3.5 m[IU]/mL — ABNORMAL LOW (ref >9.9)

## 2022-10-27 LAB — MAGNESIUM: Magnesium: 2.1 mg/dL (ref 1.7–2.4)

## 2022-10-27 MED ORDER — LACTULOSE 10 GM/15ML PO SOLN
10.0000 g | Freq: Every day | ORAL | Status: DC
  Administered 2022-10-27: 10 g via ORAL
  Filled 2022-10-27 (×3): qty 15

## 2022-10-27 MED ORDER — BISOPROLOL FUMARATE 5 MG PO TABS
2.5000 mg | ORAL_TABLET | Freq: Every day | ORAL | Status: DC
  Administered 2022-10-27: 2.5 mg via ORAL
  Filled 2022-10-27 (×3): qty 0.5

## 2022-10-27 NOTE — Discharge Instructions (Addendum)
Dear Donald Moreno,  Thank you for letting us participate in your care. You were hospitalized for abdominal pain and diagnosed with Abdominal pain. You were treated with paracentesis to remove fluid which helped your pain.   POST-HOSPITAL & CARE INSTRUCTIONS We decreased your bisoprolol to 2.5 mg since it was making your heart rate a little too low.  Please be sure to follow-up with your PCP and cardiologist. Go to your follow up appointments (listed below) Please follow-up with [facility] (Dr. Dulce Sellar) with the information below  DOCTOR'S APPOINTMENT   Future Appointments  Date Time Provider Department Center  10/31/2022  3:10 PM Evelena Leyden, DO [facility]  11/02/2022  2:00 PM Black, Anson Fret, AUD [facility] None    Follow-up Information     Lilland, Alana, DO. Schedule an appointment as soon as possible for a visit.   Specialty: Family Medicine Why: Make an appointment for hospital follow-up ASAP. Contact information: [address] [place] [address] [phone]         Willis Modena, MD. Schedule an appointment as soon as possible for a visit in 1 week(s).   Specialty: Gastroenterology Contact information: [address] [place] [address] [phone]                 Take care and be well!  Family Medicine Teaching Service Inpatient Team [place]  [facility]  [address] [place], [place] [address] [phone]

## 2022-10-27 NOTE — TOC Benefit Eligibility Note (Signed)
Pharmacy Patient Advocate Encounter  Insurance verification completed.    The patient is insured through [facility]    Ran test claim for Spironolactone and the current 30 day co-pay is $0.00.  Ran test claim for Eplerenone and the current 30 day co-pay is $0.00.  This test claim was processed through [facility]- copay amounts may vary at other pharmacies due to pharmacy/plan contracts, or as the patient moves through the different stages of their insurance plan.

## 2022-10-27 NOTE — Progress Notes (Signed)
Daily Progress Note Intern Pager: [phone]  [name] Medical record number: [id number] Date of birth: 05/28/1940 Age: 82 y.o. Gender: male  Primary Care Provider: Evelena Leyden, DO Consultants: IR Code Status: DNR/DNI  Pt Overview and Major Events to Date:  6/12-admitted  Assessment and Plan:  Donald Moreno is a 82 year old male presenting with abdominal pain likely due to ascites with cirrhosis. Pertinent PMH/PSH includes hypertension, asthma, CKD 4, a flutter, alcoholic cirrhosis, anemia, AVN left hip, esophageal varices.   Hospital Problem List      Hospital   Abdominal pain likely due to ascites with cirrhosis     Resolved.  Likely due to ascites that has been removed by IR. -MiraLAX for bowel regimen -Clear liquid diet, advance as tolerated -Tylenol as needed -AM CMP, Mag, CBC        HYPERTENSION, BENIGN SYSTEMIC     Hypertensive to 160s systolic while in hospital ON.  Heart rate a  little soft with current dose of bisoprolol, will decrease.  Will also  monitor blood pressure with above diuretics. -Continue home amlodipine -Continue home bisoprolol at reduced dose to 25 mg -Continue Lasix, spironolactone        CKD (chronic kidney disease) stage 4, GFR 15-29 ml/min (HCC)     Creatinine 2.11 on admission.  Now 2.08 today.  Recent baseline from  2.0-2.4. -Monitor creatinine after Lasix dose -A.m. BMP -Avoid nephrotoxic agents        Typical atrial flutter (HCC)     Rates ranging 40s to 50s.  Longstanding bradycardia documented in  chart.  Not on any anticoagulation likely in the setting of history of  esophageal varices. -Continue home bisoprolol at reduced dose 2.5 mg given lower heart rate as  above -Cardiac monitoring        Cirrhosis of liver (HCC)     Hx of heavy alcohol use. Last drink 30 years ago, unlikely recent  alcohol use. RUQ cirrhosis and large volume ascites.  CT with ascites.   AST and ALT elevated. S/p TIPS  procedure.  LFTs slightly more elevated  this morning, and total bilirubin now 4.0 from 1.8. -Has been started on Lasix and spironolactone for cirrhosis -Will also start lactulose today and see how he tolerates tonight. -CMP, trend LFTs -Fractionated bilirubin         Chronic heart failure with preserved ejection fraction (HFpEF) (HCC)     Last echocardiogram in 2022 with EF of 50 to 55%. Repeat echo with LVEF  55 to 60%, severely elevated pulmonary artery pressure; pulmonary with no  further intervention.  Has had continued lower extremity swelling that  improved today.  DVT ultrasound negative.  Saturating well on room air on  my examination. -Strict intake and output -Daily weights -A.m. CMP, consider redose of Lasix if creatinine stable or improving   Chronic conditions Gout-continue allopurinol Asthma-continue montelukast, continue [place] formulary equivalent Avascular necrosis of left hip-continue tramadol as needed, vitamin D3 Esophageal varices-held pharmacologic VTE prophylaxis Hx of alcohol use-no drink in 30 years, did not place on CIWAs  FEN/GI: Clear liquid diet PPx: SCDs Dispo: Pending continued improvement with new cirrhosis medication management  Subjective:  Well this morning without any abdominal pain.  Objective: Temp:  [98 F (36.7 C)-98.5 F (36.9 C)] 98.1 F (36.7 C) (06/13 0845) Pulse Rate:  [52-108] 108 (06/13 0845) Resp:  [17-18] 17 (06/13 0845) BP: (145-164)/(52-68) 145/68 (06/13 0845) SpO2:  [98 %-100 %] 100 % (06/13 0845) Weight:  [  78.8 kg] 78.8 kg (06/13 0709) Physical Exam: General: Sitting up in bed, conversant, no acute distress Cardiovascular: Regular rate without murmurs rubs or gallops Respiratory: Clear to auscultation bilaterally anteriorly without wheezes rales or rhonchi Abdomen: Soft, nondistended, nontender, normoactive bowel sounds Extremities: Moves all extremity grossly equally  Laboratory: Most recent CBC Lab Results   Component Value Date   WBC 5.1 10/27/2022   HGB 11.2 (L) 10/27/2022   HCT 33.3 (L) 10/27/2022   MCV 100.9 (H) 10/27/2022   PLT 139 (L) 10/27/2022   Most recent BMP    Latest Ref Rng & Units 10/27/2022    9:18 AM  BMP  Glucose 70 - 99 mg/dL 87   BUN 8 - 23 mg/dL 27   Creatinine 4.09 - 1.24 mg/dL 8.11   Sodium 914 - 782 mmol/L 135   Potassium 3.5 - 5.1 mmol/L 4.5   Chloride 98 - 111 mmol/L 108   CO2 22 - 32 mmol/L 20   Calcium 8.9 - 10.3 mg/dL 8.7    Evette Georges, MD 10/27/2022, 11:59 AM PGY-1, [facility] FPTS Intern pager: [phone], text pages welcome Secure chat group [facility] Teaching Service

## 2022-10-27 NOTE — Discharge Summary (Signed)
Family Medicine Teaching [facility] Discharge Summary  [name] Medical record number: [id number] Date of birth: [birth year] Age: 82 y.o. Gender: male Date of Admission: 10/25/2022  Date of Discharge: 10/29/2022 Admitting Physician: Levin Erp, MD  Primary Care Provider: Evelena Leyden, DO Consultants: Pulmonology, GI, IR  Indication for Hospitalization: Ascites with abdominal pain  Brief Hospital Course:  Donald Moreno is a 82 y.o. male who was admitted to the [facility] Medicine Teaching Service at [facility] for abdominal pain in the setting of alcoholic liver cirrhosis and ascites. Hospital course is outlined below by problem.   Ascites in the setting of alcoholic liver cirrhosis Presented with abdominal pain and tense, distended abdomen.  Liver enzymes elevated with negative hepatitis panel.  Lipase elevated likely in setting of liver disease. CT abdomen, ultrasound, and x-ray with large ascites; no evidence of pancreatitis.  He received IV Lasix 40 mg in the ED.  IR was consulted who performed therapeutic and diagnostic paracentesis with improvement in pain.  Fluid studies with PMN less than 250, no antibiotics were started.  Other studies demonstrated Gram stain with no WBC or organisms, culture with no growth in <24 hrs. he did have an episode of recurrent pain over admission for which KUB EKG and tropes were unremarkable.  Repeat right upper quadrant sound and ultrasound TIPS demonstrated some wall thickening with multiple gallstones, no evidence of acute cholecystitis. GI was consulted who recommended HIDA scan with ejection fraction which showed no evidence of cholecystitis. Calculated gallbladder ejection fraction of 100%. He was advised to follow-up in the outpatient setting with [facility] (Dr. Dulce Sellar) as he is overdue for EGD to survey esophageal varices.  He may have AFP checked in the outpatient setting at that GI follow-up.  CHF Presented with bilateral lower  limb edema worse on the right.  BMP 1040.1.  Lasix administered as above.  DVT ultrasound obtained which was negative for acute DVT.  Echo in 2022 with EF of 50 to 55%, repeat echo here with EF of 55 to 60%.  He was on amlodipine for hypertension, though this was discontinued due to worsening edema.  Home bisoprolol continued at reduced dose 2.5 given softer rates in the hospital to 40-50s.  Pulmonary arterial hypertension Found on echo.  Appears concern for this for a while.  Pulmonology consulted who recommended no further workup given he is not a candidate for vasodilator therapy given advanced cirrhosis; will advise diuresis as renal function permits.  CKD stage IIIb Baseline creatinine around 2, presented with mildly increased to 2.13.  Follows with [facility] per records.  He received diuresis as above.  By discharge, his creatinine was 1.77.  Chronic macrocytic anemia Hemoglobin 10.3 and MCV 100.3 on admission.  Folate and vitamin B12 normal. TIBC 204, sat ratio 26, ferritin 772. Reticulocyte count 32.3. Less likely due to alcoholism given last drink was 30 years ago. Less likely myelodysplastic syndrome given otherwise unremarkable cell lines. Less likely hypothyroidism given normal TSH in 2022, and do not feel like rechecking here in the hospital will be of much utility given he is acutely ill. Most likely explanation is known liver disease.  Hemoglobin remained stable over admission 10.6.  Chronic conditions Gout-continued allopurinol Asthma-continued montelukast, continued [place] formulary equivalent Avascular necrosis of left hip-continued tramadol as needed, vitamin D3 Esophageal varices-continued bisoprolol, added Lovenox for VTE prophylaxis Hx of alcohol use-no drink in 30 years, did not place on CIWAs  Issues for follow up: Follow-up volume and ascites status  on spironolactone and Lasix Ensure follow-up with pulmonology for [facility] Ensure follow-up for CKD Monitor chronic  microcytic anemia Ensure follow up with [facility] (Dr. Dulce Sellar) for EGD to survey esophageal varices Recommend BMP in 1 week f/u  Discharge Diagnoses/Problem List:  Principal Problem for Admission: Ascites due to alcoholic cirrhosis Other Problems addressed during stay:  Present on Admission:  Chronic heart failure with preserved ejection fraction (HFpEF) (HCC)  Typical atrial flutter (HCC)  CKD (chronic kidney disease) stage 4, GFR 15-29 ml/min (HCC)  Abdominal pain likely due to ascites with cirrhosis  HYPERTENSION, BENIGN SYSTEMIC  Cirrhosis of liver (HCC)  Ascites due to alcoholic cirrhosis (HCC)   Disposition: Home  Discharge Condition: Stable  Discharge Exam:  Blood pressure (!) 158/59, pulse (!) 51, temperature 98.1 F (36.7 C), temperature source Oral, resp. rate 16, height 5\' 10"  (1.778 m), weight 78.8 kg, SpO2 99 %.  General: Walking with a walker, NAD, conversational Cardiovascular: RRR, no murmurs auscultated Respiratory: CTAB, normal WOB Abdomen: Soft, nontender, normoactive bowel sounds Extremities: No pitting edema bilateral lower extremities  Significant Procedures: IR paracentesis  Significant Labs and Imaging:  Recent Labs  Lab 10/28/22 0431  WBC 6.1  HGB 10.6*  HCT 31.3*  PLT 142*   Recent Labs  Lab 10/28/22 0215 10/29/22 0205  NA 137 137  K 4.1 3.9  CL 106 106  CO2 23 22  GLUCOSE 82 68*  BUN 25* 24*  CREATININE 1.92* 1.77*  CALCIUM 8.9 8.5*  ALKPHOS 259* 223*  AST 80* 52*  ALT 62* 49*  ALBUMIN 2.6* 2.3*   NM Hepato W/EF  Result Date: 10/29/2022 CLINICAL DATA:  Abdominal pain, upper, chronic, assess gallbladder motility EXAM: NUCLEAR MEDICINE HEPATOBILIARY IMAGING WITH GALLBLADDER EF TECHNIQUE: Sequential images of the abdomen were obtained out to 60 minutes following intravenous administration of radiopharmaceutical. After oral ingestion of Ensure, gallbladder ejection fraction was determined. At 60 min, normal ejection fraction is greater  than 33%. RADIOPHARMACEUTICALS:  7.5 mCi Tc-53m  Choletec IV COMPARISON:  Ultrasound 10/28/2022 FINDINGS: Prompt uptake and biliary excretion of activity by the liver is seen. Gallbladder activity is visualized, consistent with patency of cystic duct. Biliary activity passes into small bowel, consistent with patent common bile duct. Calculated gallbladder ejection fraction is 100%. (Normal gallbladder ejection fraction with Ensure is greater than 33%.) IMPRESSION: 1. Normal scintigraphic hepatobiliary scan. No evidence of cholecystitis. 2. Calculated gallbladder ejection fraction of 100%. Electronically Signed   By: Duanne Guess D.O.   On: 10/29/2022 16:57   US Abdomen Limited RUQ (LIVER/GB)  Result Date: 10/28/2022 CLINICAL DATA:  Abdominal pain EXAM: ULTRASOUND ABDOMEN LIMITED RIGHT UPPER QUADRANT COMPARISON:  CT 10/25/2022.  Ultrasound 10/26/2022. FINDINGS: Gallbladder: Gallbladder mildly distended with multiple stones. There is wall thickening measuring up to 4 mm. Adjacent fluid with the ascites. Common bile duct: Diameter: 6 mm, unchanged from prior Liver: No focal lesion identified. Within normal limits in parenchymal echogenicity. There is a tips shunt in place. Flow in the tips seen diffusely on color Doppler. Velocity along the proximal aspect of the tips of 107, mid 188 and distal 161 centimeters/second. Appropriate direction flow in the tips. The main portal vein is patent with velocity of 31 centimeters/second. Preserved flow in the central left portal vein and right. Preserved flow in the left, middle and right portal vein. Preserved waveform of the hepatic artery with peak systolic velocity of 128 centimeters/second. Diameter of the main portal vein of 16 mm. Other: None. IMPRESSION: Multiple gallstones with wall  thickening. Adjacent ascites. The wall thickening could relate to level of the ascites. The gallbladder itself is nondilated. No biliary ductal dilatation. Patent tips shunt without  findings of significant stenosis or abnormal velocity. Electronically Signed   By: Karen Kays M.D.   On: 10/28/2022 17:23   US LIVER DOPPLER  Result Date: 10/28/2022 CLINICAL DATA:  Abdominal pain EXAM: ULTRASOUND ABDOMEN LIMITED RIGHT UPPER QUADRANT COMPARISON:  CT 10/25/2022.  Ultrasound 10/26/2022. FINDINGS: Gallbladder: Gallbladder mildly distended with multiple stones. There is wall thickening measuring up to 4 mm. Adjacent fluid with the ascites. Common bile duct: Diameter: 6 mm, unchanged from prior Liver: No focal lesion identified. Within normal limits in parenchymal echogenicity. There is a tips shunt in place. Flow in the tips seen diffusely on color Doppler. Velocity along the proximal aspect of the tips of 107, mid 188 and distal 161 centimeters/second. Appropriate direction flow in the tips. The main portal vein is patent with velocity of 31 centimeters/second. Preserved flow in the central left portal vein and right. Preserved flow in the left, middle and right portal vein. Preserved waveform of the hepatic artery with peak systolic velocity of 128 centimeters/second. Diameter of the main portal vein of 16 mm. Other: None. IMPRESSION: Multiple gallstones with wall thickening. Adjacent ascites. The wall thickening could relate to level of the ascites. The gallbladder itself is nondilated. No biliary ductal dilatation. Patent tips shunt without findings of significant stenosis or abnormal velocity. Electronically Signed   By: Karen Kays M.D.   On: 10/28/2022 17:23   DG Abd 1 View  Result Date: 10/28/2022 CLINICAL DATA:  82 year old male with sudden saw onset abdominal pain at paracentesis site. EXAM: ABDOMEN - 1 VIEW COMPARISON:  CT Abdomen and Pelvis 10/26/2022 and earlier. FINDINGS: Portable AP supine view at 0350 hours. TIPS. Aortoiliac calcified atherosclerosis. Lung bases appear negative. Bowel gas pattern nonobstructed, similar to the recent CT. Some decrease in visible abdominal and  pelvic visceral contours likely due to some residual ascites. Stable visualized osseous structures. Advanced bilateral femoral head AVN, hip degeneration. IMPRESSION: 1. No acute radiographic finding. 2. TIPS. Aortic Atherosclerosis (ICD10-I70.0). Advanced bilateral femoral head AVN, hip degeneration. Electronically Signed   By: Odessa Fleming M.D.   On: 10/28/2022 04:57   VAS [place] LOWER EXTREMITY VENOUS (DVT)  Result Date: 10/26/2022  Lower Venous DVT Study Patient Name:  GARVEY REFFETT  Date of Exam:   10/26/2022 Medical Rec #: [id number]       Accession #:    [id number] Date of Birth: June 20, 1940        Patient Gender: M Patient Age:   72 years Exam Location:  [facility] Procedure:      VAS [place] LOWER EXTREMITY VENOUS (DVT) Referring Phys: Janit Pagan --------------------------------------------------------------------------------  Indications: Edema, RT>LT.  Comparison Study: No prior studies. Performing Technologist: Jean Rosenthal RDMS, RVT  Examination Guidelines: A complete evaluation includes B-mode imaging, spectral Doppler, color Doppler, and power Doppler as needed of all accessible portions of each vessel. Bilateral testing is considered an integral part of a complete examination. Limited examinations for reoccurring indications may be performed as noted. The reflux portion of the exam is performed with the patient in reverse Trendelenburg.  +---------+---------------+---------+-----------+----------+--------------+ RIGHT    CompressibilityPhasicitySpontaneityPropertiesThrombus Aging +---------+---------------+---------+-----------+----------+--------------+ CFV      Full           Yes      Yes                                 +---------+---------------+---------+-----------+----------+--------------+  SFJ      Full                                                        +---------+---------------+---------+-----------+----------+--------------+ FV Prox  Full                                                         +---------+---------------+---------+-----------+----------+--------------+ FV Mid   Full                                                        +---------+---------------+---------+-----------+----------+--------------+ FV DistalFull                                                        +---------+---------------+---------+-----------+----------+--------------+ PFV      Full                                                        +---------+---------------+---------+-----------+----------+--------------+ POP      Full           Yes      Yes                                 +---------+---------------+---------+-----------+----------+--------------+ PTV      Full                                                        +---------+---------------+---------+-----------+----------+--------------+ PERO     Full                                                        +---------+---------------+---------+-----------+----------+--------------+   +----+---------------+---------+-----------+----------+--------------+ LEFTCompressibilityPhasicitySpontaneityPropertiesThrombus Aging +----+---------------+---------+-----------+----------+--------------+ CFV Full           Yes      Yes                                 +----+---------------+---------+-----------+----------+--------------+     Summary: RIGHT: - There is no evidence of deep vein thrombosis in the lower extremity.  - No cystic structure found in the popliteal fossa.  - Overlying subcutaneous edema noted.  LEFT: - No evidence of common femoral vein obstruction.  *See table(s) above for measurements and observations. Electronically  signed by Heath Lark on 10/26/2022 at 4:58:56 PM.    Final    ECHOCARDIOGRAM COMPLETE  Result Date: 10/26/2022    ECHOCARDIOGRAM REPORT   Patient Name:   LAVERE ECHARD Date of Exam: 10/26/2022 Medical Rec #:  [id number]      Height:       70.0 in  Accession #:    [id number]     Weight:       173.5 lb Date of Birth:  1940/11/23       BSA:          1.965 m Patient Age:    82 years       BP:           156/73 mmHg Patient Gender: M              HR:           55 bpm. Exam Location:  Inpatient Procedure: 2D Echo, Color Doppler and Cardiac Doppler Indications:    Dyspnea  History:        Patient has prior history of Echocardiogram examinations, most                 recent 04/18/2021. CHF, CKD, LEE, Aclute Alc. Pancreatitis,                 Arrythmias:Atrial Flutter; Risk Factors:Hypertension.  Sonographer:    Milbert Coulter Referring Phys: [id number] Theador Hawthorne ENIOLA IMPRESSIONS  1. Left ventricular ejection fraction, by estimation, is 55 to 60%. The left ventricle has normal function. The left ventricle has no regional wall motion abnormalities. Left ventricular diastolic parameters are indeterminate.  2. Right ventricular systolic function is normal. The right ventricular size is mildly enlarged. There is severely elevated pulmonary artery systolic pressure. The estimated right ventricular systolic pressure is 76.2 mmHg.  3. Patent left atrial appendange on TTE assessment. Left atrial size was mildly dilated.  4. The mitral valve is normal in structure. Mild mitral valve regurgitation. No evidence of mitral stenosis.  5. Tricuspid valve regurgitation is moderate to severe.  6. The aortic valve is tricuspid. Aortic valve regurgitation is trivial. No aortic stenosis is present.  7. The inferior vena cava is dilated in size with <50% respiratory variability, suggesting right atrial pressure of 15 mmHg. Comparison(s): Prior images reviewed side by side. Tricuspid regurgitation has increased. FINDINGS  Left Ventricle: Left ventricular ejection fraction, by estimation, is 55 to 60%. The left ventricle has normal function. The left ventricle has no regional wall motion abnormalities. The left ventricular internal cavity size was normal in size. There is  no left ventricular  hypertrophy. Left ventricular diastolic parameters are indeterminate. Right Ventricle: The right ventricular size is mildly enlarged. No increase in right ventricular wall thickness. Right ventricular systolic function is normal. There is severely elevated pulmonary artery systolic pressure. The tricuspid regurgitant velocity is 3.91 m/s, and with an assumed right atrial pressure of 15 mmHg, the estimated right ventricular systolic pressure is 76.2 mmHg. Left Atrium: Patent left atrial appendange on TTE assessment. Left atrial size was mildly dilated. Right Atrium: Right atrial size was normal in size. Pericardium: Trivial pericardial effusion is present. The pericardial effusion is localized near the left atrium and localized near the right atrium. Mitral Valve: The mitral valve is normal in structure. Mild mitral valve regurgitation. No evidence of mitral valve stenosis. Tricuspid Valve: The tricuspid valve is normal in structure. Tricuspid valve regurgitation is moderate to severe. Aortic Valve: The aortic valve is tricuspid.  Aortic valve regurgitation is trivial. No aortic stenosis is present. Aortic valve mean gradient measures 7.5 mmHg. Aortic valve peak gradient measures 13.9 mmHg. Aortic valve area, by VTI measures 1.68 cm. Pulmonic Valve: The pulmonic valve was normal in structure. Pulmonic valve regurgitation is mild. No evidence of pulmonic stenosis. Aorta: The aortic root and ascending aorta are structurally normal, with no evidence of dilitation. Venous: The inferior vena cava is dilated in size with less than 50% respiratory variability, suggesting right atrial pressure of 15 mmHg. IAS/Shunts: There is left bowing of the interatrial septum, suggestive of elevated right atrial pressure. No atrial level shunt detected by color flow Doppler.  LEFT VENTRICLE PLAX 2D LVIDd:         5.50 cm      Diastology LVIDs:         3.80 cm      LV e' medial:    8.38 cm/s LV PW:         1.10 cm      LV E/e' medial:   18.3 LV IVS:        1.00 cm      LV e' lateral:   9.79 cm/s LVOT diam:     1.90 cm      LV E/e' lateral: 15.6 LV SV:         74 LV SV Index:   38 LVOT Area:     2.84 cm  LV Volumes (MOD) LV vol d, MOD A2C: 79.3 ml LV vol d, MOD A4C: 102.0 ml LV vol s, MOD A2C: 33.2 ml LV vol s, MOD A4C: 41.8 ml LV SV MOD A2C:     46.1 ml LV SV MOD A4C:     102.0 ml LV SV MOD BP:      52.2 ml RIGHT VENTRICLE             IVC RV Basal diam:  4.50 cm     IVC diam: 2.30 cm RV Mid diam:    4.00 cm RV S prime:     10.80 cm/s TAPSE (M-mode): 2.0 cm LEFT ATRIUM             Index        RIGHT ATRIUM           Index LA diam:        3.90 cm 1.98 cm/m   RA Area:     18.90 cm LA Vol (A2C):   60.9 ml 30.99 ml/m  RA Volume:   54.80 ml  27.89 ml/m LA Vol (A4C):   64.6 ml 32.88 ml/m LA Biplane Vol: 62.9 ml 32.01 ml/m  AORTIC VALVE AV Area (Vmax):    1.92 cm AV Area (Vmean):   1.86 cm AV Area (VTI):     1.68 cm AV Vmax:           186.50 cm/s AV Vmean:          121.500 cm/s AV VTI:            0.442 m AV Peak Grad:      13.9 mmHg AV Mean Grad:      7.5 mmHg LVOT Vmax:         126.00 cm/s LVOT Vmean:        79.850 cm/s LVOT VTI:          0.262 m LVOT/AV VTI ratio: 0.59  AORTA Ao Root diam: 2.90 cm Ao Asc diam:  2.90 cm MITRAL VALVE  TRICUSPID VALVE MV Area (PHT): 4.06 cm     TR Peak grad:   61.2 mmHg MV Decel Time: 187 msec     TR Vmax:        391.00 cm/s MV E velocity: 153.00 cm/s MV A velocity: 80.00 cm/s   SHUNTS MV E/A ratio:  1.91         Systemic VTI:  0.26 m                             Systemic Diam: 1.90 cm Riley Lam MD Electronically signed by Riley Lam MD Signature Date/Time: 10/26/2022/2:22:17 PM    Final    IR Paracentesis  Result Date: 10/26/2022 INDICATION: Abdominal distention. Ascites. Request for diagnostic and therapeutic paracentesis. EXAM: ULTRASOUND GUIDED RIGHT LOWER QUADRANT PARACENTESIS MEDICATIONS: 1% plain lidocaine, 4 mL COMPLICATIONS: None immediate. PROCEDURE: Informed written  consent was obtained from the patient after a discussion of the risks, benefits and alternatives to treatment. A timeout was performed prior to the initiation of the procedure. Initial ultrasound scanning demonstrates a large amount of ascites within the right lower abdominal quadrant. The right lower abdomen was prepped and draped in the usual sterile fashion. 1% lidocaine was used for local anesthesia. Following this, a 19 gauge, 7-cm, Yueh catheter was introduced. An ultrasound image was saved for documentation purposes. The paracentesis was performed. The catheter was removed and a dressing was applied. The patient tolerated the procedure well without immediate post procedural complication. Patient received post-procedure intravenous albumin; see nursing notes for details. FINDINGS: A total of approximately 5.1 L of cloudy, milky, chylous fluid was removed. Samples were sent to the laboratory as requested by the clinical team. IMPRESSION: Successful ultrasound-guided paracentesis yielding 5.1 L liters of chylous peritoneal fluid. PLAN: The patient has previously been formally evaluated by the [facility] Interventional Radiology Portal Hypertension Clinic and is being actively followed for potential future intervention. He underwent TIPS placement by Dr. Deanne Coffer in 10/11/2016, with last ultrasound TIPS Doppler (06/01/2022) with patent TIPS. We will recommend repeat TIPS Doppler, and ambulatory follow-up in the portal hypertension clinic with Dr. Deanne Coffer. Procedure performed by Brayton El PA-C and supervised by Dr. Roanna Banning [facility] Electronically Signed   By: Roanna Banning M.D.   On: 10/26/2022 10:06   DG ABD ACUTE 2+V W 1V CHEST  Result Date: 10/26/2022 CLINICAL DATA:  Abdominal distension EXAM: DG ABDOMEN ACUTE WITH 1 VIEW CHEST COMPARISON:  06/09/2021 FINDINGS: Cardiomegaly, vascular congestion. Bibasilar atelectasis or infiltrates, right  greater than left. No visible effusions. No acute bony abnormality. Aortic atherosclerosis. IMPRESSION: Cardiomegaly, vascular congestion. Bibasilar atelectasis or infiltrates. Electronically Signed   By: Charlett Nose M.D.   On: 10/26/2022 02:22   CT ABDOMEN PELVIS WO CONTRAST  Result Date: 10/26/2022 CLINICAL DATA:  Bowel obstruction suspected EXAM: CT ABDOMEN AND PELVIS WITHOUT CONTRAST TECHNIQUE: Multidetector CT imaging of the abdomen and pelvis was performed following the standard protocol without IV contrast. RADIATION DOSE REDUCTION: This exam was performed according to the departmental dose-optimization program which includes automated exposure control, adjustment of the mA and/or kV according to patient size and/or use of iterative reconstruction technique. COMPARISON:  10/04/2020 FINDINGS: Lower chest: Small bilateral pleural effusions. Bibasilar airspace opacities, likely atelectasis. Hepatobiliary: Numerous gallstones layering within the gallbladder. No visible biliary ductal dilatation. Small central cyst measuring 1.4 cm, stable. Prior tips. Pancreas: No focal abnormality or ductal dilatation. Spleen: No focal abnormality.  Normal size. Adrenals/Urinary Tract: 1 cm low-density lesion in the lower pole of the right kidney is stable since prior study, likely cyst. No follow-up imaging recommended. No stones or hydronephrosis. Adrenal glands and urinary bladder unremarkable. Stomach/Bowel: Stomach, large and small bowel grossly unremarkable. Vascular/Lymphatic: Aortic atherosclerosis. No evidence of aneurysm or adenopathy. Reproductive: Mildly prominent prostate Other: Large volume ascites in the abdomen and pelvis, new since prior study. Musculoskeletal: No acute bony abnormality. IMPRESSION: Prior tips within the liver. Large volume ascites in the abdomen and pelvis. Cholelithiasis. Aortic atherosclerosis. Electronically Signed   By: Charlett Nose M.D.   On: 10/26/2022 02:12   US Abdomen Limited  RUQ (LIVER/GB)  Result Date: 10/26/2022 CLINICAL DATA:  Abdominal pain. EXAM: ULTRASOUND ABDOMEN LIMITED RIGHT UPPER QUADRANT COMPARISON:  None Available. FINDINGS: Gallbladder: Multiple shadowing, echogenic gallstones are seen within the gallbladder lumen. The largest measures approximately 1.1 cm. The gallbladder wall measures 3.3 mm in thickness. No sonographic Murphy sign noted by sonographer. Common bile duct: Diameter: 6.1 mm Liver: No focal lesion identified. The liver parenchyma is nodular in echotexture and diffusely increased in echogenicity. Portal vein is patent on color Doppler imaging with normal direction of blood flow towards the liver. Other: A large amount of ascites is present. IMPRESSION: 1. Cholelithiasis, without evidence of acute cholecystitis. 2. Hepatic steatosis and hepatic cirrhosis without focal liver lesions. 3. Ascites. Electronically Signed   By: Aram Candela M.D.   On: 10/26/2022 01:41     Results/Tests Pending at Time of Discharge:  Unresulted Labs (From admission, onward)    None       Discharge Medications:  Allergies as of 10/29/2022   No Known Allergies      Medication List     TAKE these medications    acetaminophen 500 MG tablet Commonly known as: TYLENOL Take 1 tablet (500 mg total) by mouth every 6 (six) hours as needed for mild pain (or Fever >/= 101).   albuterol 108 (90 Base) MCG/ACT inhaler Commonly known as: ProAir HFA Inhale 2 puffs into the lungs every 4 (four) hours as needed for wheezing.   allopurinol 100 MG tablet Commonly known as: ZYLOPRIM TAKE 1/2 TABLET BY MOUTH EVERY DAY   amLODipine 5 MG tablet Commonly known as: NORVASC Take 1 tablet (5 mg total) by mouth daily.   bisoprolol 5 MG tablet Commonly known as: ZEBETA Take 0.5 tablets (2.5 mg total) by mouth daily. Start taking on: October 30, 2022 What changed:  how much to take how to take this when to take this additional instructions   furosemide 20 MG  tablet Commonly known as: LASIX Take 1 tablet (20 mg total) by mouth daily.   lactulose 10 GM/15ML solution Commonly known as: CHRONULAC Take 15 mLs (10 g total) by mouth daily. Start taking on: October 30, 2022   montelukast 10 MG tablet Commonly known as: SINGULAIR TAKE 1 TABLET BY MOUTH EVERYDAY AT BEDTIME What changed: See the new instructions.   multivitamin with minerals Tabs tablet Take 1 tablet by mouth daily.   polyethylene glycol 17 g packet Commonly known as: MIRALAX / GLYCOLAX Take 17 g by mouth daily.   spironolactone 25 MG tablet Commonly known as: ALDACTONE Take 1 tablet (25 mg total) by mouth daily.   traMADol 50 MG tablet Commonly known as: ULTRAM TAKE 1 TABLET BY MOUTH EVERY DAY AT BEDTIME AS NEEDED   Vitamin D-3 125 MCG (5000 UT) Tabs Take 1,000 Units by mouth daily.   Wixela Inhub 500-50  MCG/ACT Aepb Generic drug: fluticasone-salmeterol TAKE 1 PUFF BY MOUTH TWICE A DAY What changed: See the new instructions.        Discharge Instructions: Please refer to Patient Instructions section of EMR for full details.  Patient was counseled important signs and symptoms that should prompt return to medical care, changes in medications, dietary instructions, activity restrictions, and follow up appointments.   Follow-Up Appointments:  Follow-up Information     Lilland, Alana, DO. Schedule an appointment as soon as possible for a visit.   Specialty: Family Medicine Why: Make an appointment for hospital follow-up ASAP. Contact information: [address] [place] [address] [phone]         Willis Modena, MD. Schedule an appointment as soon as possible for a visit in 1 week(s).   Specialty: Gastroenterology Contact information: [address] [place] [address] [phone]                [name], DO 10/29/2022, 5:28 PM PGY-2, [facility]

## 2022-10-28 ENCOUNTER — Inpatient Hospital Stay (HOSPITAL_COMMUNITY): Payer: 59

## 2022-10-28 ENCOUNTER — Other Ambulatory Visit (HOSPITAL_COMMUNITY): Payer: 59

## 2022-10-28 LAB — CBC
HCT: 31.3 % — ABNORMAL LOW (ref 39.0–52.0)
Hemoglobin: 10.6 g/dL — ABNORMAL LOW (ref 13.0–17.0)
MCH: 33.3 pg (ref 26.0–34.0)
MCHC: 33.9 g/dL (ref 30.0–36.0)
MCV: 98.4 fL (ref 80.0–100.0)
Platelets: 142 10*3/uL — ABNORMAL LOW (ref 150–400)
RBC: 3.18 MIL/uL — ABNORMAL LOW (ref 4.22–5.81)
RDW: 13.5 % (ref 11.5–15.5)
WBC: 6.1 10*3/uL (ref 4.0–10.5)
nRBC: 0 % (ref 0.0–0.2)

## 2022-10-28 LAB — COMPREHENSIVE METABOLIC PANEL
ALT: 62 U/L — ABNORMAL HIGH (ref 0–44)
AST: 80 U/L — ABNORMAL HIGH (ref 15–41)
Albumin: 2.6 g/dL — ABNORMAL LOW (ref 3.5–5.0)
Alkaline Phosphatase: 259 U/L — ABNORMAL HIGH (ref 38–126)
Anion gap: 8 (ref 5–15)
BUN: 25 mg/dL — ABNORMAL HIGH (ref 8–23)
CO2: 23 mmol/L (ref 22–32)
Calcium: 8.9 mg/dL (ref 8.9–10.3)
Chloride: 106 mmol/L (ref 98–111)
Creatinine, Ser: 1.92 mg/dL — ABNORMAL HIGH (ref 0.61–1.24)
GFR, Estimated: 35 mL/min — ABNORMAL LOW (ref >60)
Glucose, Bld: 82 mg/dL (ref 70–99)
Potassium: 4.1 mmol/L (ref 3.5–5.1)
Sodium: 137 mmol/L (ref 135–145)
Total Bilirubin: 4.4 mg/dL — ABNORMAL HIGH (ref 0.3–1.2)
Total Protein: 5.8 g/dL — ABNORMAL LOW (ref 6.5–8.1)

## 2022-10-28 LAB — TROPONIN I (HIGH SENSITIVITY)
Troponin I (High Sensitivity): 17 ng/L (ref <18)
Troponin I (High Sensitivity): 16 ng/L (ref <18)

## 2022-10-28 LAB — CULTURE, BODY FLUID W GRAM STAIN -BOTTLE: Culture: NO GROWTH

## 2022-10-28 LAB — LACTIC ACID, PLASMA
Lactic Acid, Venous: 1 mmol/L (ref 0.5–1.9)
Lactic Acid, Venous: 0.8 mmol/L (ref 0.5–1.9)

## 2022-10-28 MED ORDER — SIMETHICONE 80 MG PO CHEW
160.0000 mg | CHEWABLE_TABLET | Freq: Once | ORAL | Status: AC
  Administered 2022-10-28: 160 mg via ORAL
  Filled 2022-10-28: qty 2

## 2022-10-28 MED ORDER — OXYCODONE HCL 5 MG PO TABS
5.0000 mg | ORAL_TABLET | Freq: Once | ORAL | Status: AC
  Administered 2022-10-28: 5 mg via ORAL
  Filled 2022-10-28: qty 1

## 2022-10-28 NOTE — Care Management Important Message (Signed)
Important Message  Patient Details  Name: Donald Moreno MRN: [id number] Date of Birth: [birth year]   Medicare Important Message Given:  Yes     Louise Rawson Stefan Church 10/28/2022, 3:13 PM

## 2022-10-28 NOTE — Assessment & Plan Note (Signed)
Hypertensive on most recent check 140-150s. -Continue home amlodipine -Continue home bisoprolol at reduced dose to 25 mg -Continue Lasix, spironolactone 

## 2022-10-28 NOTE — Progress Notes (Addendum)
FMTS Brief Progress Note  S:paged by primary RN about abdominal pain that started around 1 AM.  Feels like he is on half bloating.  Feels like most the pain is around his paracentesis injection site.  Denies any vomiting.  Says that he has not pooped in a while and last bowel movement was yesterday very small amount.  States that the pain sometimes radiates to his back between his shoulder blades as well.  He cannot get into a comfortable position and has to keep turning signs/sitting up in chair.  O: BP (!) 152/60 (BP Location: Right Arm)   Pulse (!) 49   Temp 97.9 F (36.6 C) (Oral)   Resp 18   Ht 5\' 10"  (1.778 m)   Wt 78.8 kg   SpO2 100%   BMI 24.93 kg/m    General: NAD, awake, alert, responsive to questions Respiratory: chest rises symmetrically,  no increased work of breathing Abdomen: Soft, tender to palpation along right lower quadrant and left upper quadrant, distended (hyperactive BS appreciated  by Dr. Velna Ochs), bandage CDI in RLQ  A/P: Abdominal pain Possibly related to constipation given no bowel movement tonight or much yesterday.  Differential includes small bowel obstruction, mesenteric ischemia, pancreatitis (did have elevated lipase on admission and does have radiation of pain to his back between shoulder blades), MI, SBP (although peritoneal fluid negative and no fevers), appendicitis (no vomiting, will order CBC, consider CT abd), pain from injection site -Stat KUB -Simethicone x 1 -EKG STAT -troponin -oxycodone 5 mg x 1 -Consider CT abdomen  -CBC, LA -monitor fever curve  Donald Erp, MD 10/28/2022, 3:25 AM PGY-2, [facility] Night Resident  Please page [phone] with questions.

## 2022-10-28 NOTE — Assessment & Plan Note (Signed)
Rates ranging 40s to 50s while on reduced dose of bisoprolol, he recently had 1 dose.  Longstanding bradycardia documented in chart.  Not on any anticoagulation likely in the setting of history of esophageal varices. -Continue home bisoprolol at reduced dose 2.5 mg -Cardiac monitoring

## 2022-10-28 NOTE — Assessment & Plan Note (Signed)
Hx of heavy alcohol use. Last drink 30 years ago, unlikely recent alcohol use. RUQ cirrhosis and large volume ascites.  CT with ascites.  AST and ALT elevated. S/p TIPS procedure.  Now status post IR tap plan for admission.  LFTs overall stable this morning, and total bilirubin now 4.4 from 1.8.  Fractionated bilirubin obtained yesterday shows direct bilirubin at 2.1 and indirect bilirubin at 1.7-consider cirrhosis as etiology. -Has been started on Lasix and spironolactone for cirrhosis -Continue lactulose -CMP, trend LFTs

## 2022-10-28 NOTE — Progress Notes (Signed)
Daily Progress Note Intern Pager: [phone]  [name] Medical record number: [id number] Date of birth: [birth year] Age: 81 y.o. Gender: male  Primary Care Provider: Evelena Leyden, DO Consultants: IR Code Status: DNR/DNI   Pt Overview and Major Events to Date:  6/12-admitted   Assessment and Plan:   Donald Moreno is a 82 year old male presenting with abdominal pain likely due to ascites with cirrhosis. Pertinent PMH/PSH includes hypertension, asthma, CKD 4, a flutter, alcoholic cirrhosis, anemia, AVN left hip, esophageal varices.   Hospital Problem List      Hospital      HYPERTENSION, BENIGN SYSTEMIC     Hypertensive on most recent check 140-150s. -Continue home amlodipine -Continue home bisoprolol at reduced dose to 25 mg -Continue Lasix, spironolactone        CKD (chronic kidney disease) stage 4, GFR 15-29 ml/min (HCC)     Creatinine 2.11 on admission.  Now 1.92 today.  Recent baseline from  2.0-2.4. -A.m. CMP -Avoid nephrotoxic agents        Typical atrial flutter (HCC)     Rates ranging 40s to 50s while on reduced dose of bisoprolol, he  recently had 1 dose.  Longstanding bradycardia documented in chart.  Not  on any anticoagulation likely in the setting of history of esophageal  varices. -Continue home bisoprolol at reduced dose 2.5 mg -Cardiac monitoring      Hypervolemia     Patient admitted for fluid overload. Patient was discharged with lasix  60mg  daily and spironolactone 12.5mg  daily, which he is compliant with.  Patient reporting that he urinates very frequently. Patient continues to  have 2+ pitting edema in lower extremities bilaterally and scrotal edema.  -increase lasix to 60mg  bid. -increase spironolactone to 25mg  daily  -follow up in 1 week for monitoring of edema and weight. Will need repeat  BMP at follow up given increase in spironolactone dose  -have placed order for [facility] consultation to provide scale so patient can   weight himself at home. Informed patient that if he notices significant  weight gain he should go to emergency room -strict return precautions given         Cirrhosis of liver (HCC)     Hx of heavy alcohol use. Last drink 30 years ago, unlikely recent  alcohol use. RUQ cirrhosis and large volume ascites.  CT with ascites.   AST and ALT elevated. S/p TIPS procedure.  Now status post IR tap plan for  admission.  LFTs overall stable this morning, and total bilirubin now 4.4  from 1.8.  Fractionated bilirubin obtained yesterday shows direct  bilirubin at 2.1 and indirect bilirubin at 1.7-consider cirrhosis as  etiology. -Has been started on Lasix and spironolactone for cirrhosis -Continue lactulose, consider increasing based on stools -CMP, trend LFTs        Chronic heart failure with preserved ejection fraction (HFpEF) (HCC)     Last echocardiogram in 2022 with EF of 50 to 55%. Repeat echo with LVEF  55 to 60%, severely elevated pulmonary artery pressure; pulmonary with no  further intervention.  Has had continued lower extremity swelling that  improved today.  DVT ultrasound negative.  Saturating well on room air on  my examination. -Strict intake and output -Daily weights -A.m. CMP, consider redose of Lasix if creatinine stable or improving        Pancreatitis, alcoholic, acute     Abdominal pain likely due to ascites with cirrhosis     Greatly improved,  though last night had another episode of pain mostly  around paracentesis injection site but that is sometimes shoots between  his shoulder blades into his back.  He is also not stooled since  yesterday, though it was a very small amount.  KUB, EKG, LA, troponins all  unremarkable.  Believe initial pain was due to ascites, this pain could be  due to post tap vs biliary colic.  Also consider pancreatitis, though  mildly elevated lipase on admission will make this less likely. -MiraLAX for bowel regimen -Repeat RUQ [place] with TI PS  Doppler, consider GI consult based on results -Clear liquid diet, advance as tolerated -Tylenol as needed -AM CMP, Mag, CBC -Can consider CT abdomen    Chronic conditions Gout-continue allopurinol Asthma-continue montelukast, continue Wixela formulary equivalent Avascular necrosis of left hip-continue tramadol as needed, vitamin D3 Esophageal varices-held pharmacologic VTE prophylaxis Hx of alcohol use-no drink in 30 years, did not place on CIWAs   FEN/GI: Clear liquid diet PPx: SCDs Dispo: Pending continued improvement with new cirrhosis medication management, pain control  Subjective:  Doing well this morning without any pain.  Had an episode of abdominal pain overnight where workup was overall negative.  The pain was in his upper belly and radiated to his back and between his shoulder blades.  The pain medicine they gave helped.  Objective: Temp:  [97.9 F (36.6 C)-99 F (37.2 C)] 98.2 F (36.8 C) (06/14 0856) Pulse Rate:  [42-65] 65 (06/14 0856) Resp:  [16-18] 18 (06/14 0856) BP: (111-177)/(60-73) 111/64 (06/14 0856) SpO2:  [98 %-100 %] 100 % (06/14 0856) Weight:  [78.8 kg] 78.8 kg (06/13 1900) Physical Exam: General: Sitting up in chair, no acute distress, conversant Cardiovascular: Regular rate and rhythm without murmurs rubs or gallops Respiratory: Clear to auscultation bilaterally without wheezes rales or rhonchi Abdomen: Nontender to palpation, mildly distended, mildly firm, normoactive bowel sounds Extremities: Moves all extremities grossly equally  Laboratory: Most recent CBC Lab Results  Component Value Date   WBC 6.1 10/28/2022   HGB 10.6 (L) 10/28/2022   HCT 31.3 (L) 10/28/2022   MCV 98.4 10/28/2022   PLT 142 (L) 10/28/2022   Most recent BMP    Latest Ref Rng & Units 10/28/2022    2:15 AM  BMP  Glucose 70 - 99 mg/dL 82   BUN 8 - 23 mg/dL 25   Creatinine 1.61 - 1.24 mg/dL 0.96   Sodium 045 - 409 mmol/L 137   Potassium 3.5 - 5.1 mmol/L 4.1    Chloride 98 - 111 mmol/L 106   CO2 22 - 32 mmol/L 23   Calcium 8.9 - 10.3 mg/dL 8.9    Troponin 17 Lactic acid 1, 0.8  Imaging/Diagnostic Tests: KUB IMPRESSION: 1. No acute radiographic finding. 2. TIPS. Aortic Atherosclerosis (ICD10-I70.0). Advanced bilateral femoral head AVN, hip degeneration.  Evette Georges, MD 10/28/2022, 2:24 PM PGY-1, [facility] FPTS Intern pager: [phone], text pages welcome Secure chat group [facility] Teaching Service

## 2022-10-28 NOTE — Assessment & Plan Note (Signed)
Last echocardiogram in 2022 with EF of 50 to 55%. Repeat echo with LVEF 55 to 60%, severely elevated pulmonary artery pressure; pulmonary with no further intervention.  Has had continued lower extremity swelling that improved today.  DVT ultrasound negative.  Saturating well on room air on my examination. -Strict intake and output -Daily weights -A.m. CMP, consider redose of Lasix if creatinine stable or improving 

## 2022-10-28 NOTE — Assessment & Plan Note (Addendum)
Greatly improved, though last night had another episode of pain mostly around paracentesis injection site but that is sometimes shoots between his shoulder blades into his back.  He is also not stooled since yesterday, though it was a very small amount.  KUB, EKG, LA, troponins all unremarkable.  Believe initial pain was due to ascites, this pain could be due to post tap vs biliary colic.  Also consider pancreatitis, though mildly elevated lipase on admission will make this less likely. -MiraLAX for bowel regimen -Repeat RUQ [place] with TI PS Doppler, consider GI consult based on results -Clear liquid diet, advance as tolerated -Tylenol as needed -AM CMP, Mag, CBC -Can consider CT abdomen

## 2022-10-28 NOTE — Assessment & Plan Note (Addendum)
Creatinine 2.11 on admission.  Now 1.92 today.  Recent baseline from 2.0-2.4. -A.m. CMP -Avoid nephrotoxic agents

## 2022-10-29 ENCOUNTER — Inpatient Hospital Stay (HOSPITAL_COMMUNITY): Payer: 59

## 2022-10-29 LAB — LIPASE, FLUID: Lipase-Fluid: 567 U/L

## 2022-10-29 LAB — COMPREHENSIVE METABOLIC PANEL
ALT: 49 U/L — ABNORMAL HIGH (ref 0–44)
AST: 52 U/L — ABNORMAL HIGH (ref 15–41)
Albumin: 2.3 g/dL — ABNORMAL LOW (ref 3.5–5.0)
Alkaline Phosphatase: 223 U/L — ABNORMAL HIGH (ref 38–126)
Anion gap: 9 (ref 5–15)
BUN: 24 mg/dL — ABNORMAL HIGH (ref 8–23)
CO2: 22 mmol/L (ref 22–32)
Calcium: 8.5 mg/dL — ABNORMAL LOW (ref 8.9–10.3)
Chloride: 106 mmol/L (ref 98–111)
Creatinine, Ser: 1.77 mg/dL — ABNORMAL HIGH (ref 0.61–1.24)
GFR, Estimated: 38 mL/min — ABNORMAL LOW (ref >60)
Glucose, Bld: 68 mg/dL — ABNORMAL LOW (ref 70–99)
Potassium: 3.9 mmol/L (ref 3.5–5.1)
Sodium: 137 mmol/L (ref 135–145)
Total Bilirubin: 3.2 mg/dL — ABNORMAL HIGH (ref 0.3–1.2)
Total Protein: 5.3 g/dL — ABNORMAL LOW (ref 6.5–8.1)

## 2022-10-29 LAB — CULTURE, BODY FLUID W GRAM STAIN -BOTTLE

## 2022-10-29 MED ORDER — TECHNETIUM TC 99M MEBROFENIN IV KIT
7.5000 | PACK | Freq: Once | INTRAVENOUS | Status: AC | PRN
  Administered 2022-10-29: 7.5 via INTRAVENOUS

## 2022-10-29 MED ORDER — BISOPROLOL FUMARATE 5 MG PO TABS: 2.5000 mg | ORAL_TABLET | Freq: Every day | ORAL | 0 refills | Status: DC

## 2022-10-29 MED ORDER — FUROSEMIDE 20 MG PO TABS: 20.0000 mg | ORAL_TABLET | Freq: Every day | ORAL | 0 refills | Status: DC

## 2022-10-29 MED ORDER — ADULT MULTIVITAMIN W/MINERALS CH: 1.0000 | ORAL_TABLET | Freq: Every day | ORAL | Status: DC

## 2022-10-29 MED ORDER — ACETAMINOPHEN 500 MG PO TABS: 500.0000 mg | ORAL_TABLET | Freq: Four times a day (QID) | ORAL | 0 refills | Status: DC | PRN

## 2022-10-29 MED ORDER — SPIRONOLACTONE 25 MG PO TABS: 25.0000 mg | ORAL_TABLET | Freq: Every day | ORAL | 0 refills | Status: DC

## 2022-10-29 MED ORDER — POLYETHYLENE GLYCOL 3350 17 G PO PACK: 17.0000 g | PACK | Freq: Every day | ORAL | 0 refills | Status: DC

## 2022-10-29 MED ORDER — LACTULOSE 10 GM/15ML PO SOLN: 10.0000 g | Freq: Every day | ORAL | 0 refills | Status: DC

## 2022-10-29 NOTE — Assessment & Plan Note (Addendum)
Last echocardiogram in 2022 with EF of 50 to 55%. Repeat echo with LVEF 55 to 60%, severely elevated pulmonary artery pressure; pulmonary with no further intervention.  No signs of pitting lower extremity edema.  DVT ultrasound previously negative.  Saturating well on room air on my examination. -Strict intake and output -Daily weights -A.m. CMP, consider redose of Lasix if creatinine stable or improving

## 2022-10-29 NOTE — Progress Notes (Signed)
Daily Progress Note Intern Pager: [phone]  [name] Medical record number: [id number] Date of birth: 26-Feb-1941 Age: 82 y.o. Gender: male  Primary Care Provider: Evelena Leyden, DO Consultants: IR, GI Code Status: DNR/DNI  Pt Overview and Major Events to Date:  6/12: Admitted  Assessment and Plan: Donald Moreno is a 82 y.o. male presenting with abdominal pain likely due to ascites with cirrhosis. Pertinent PMH/PSH includes HTN, asthma, CKD 4, atrial flutter, alcoholic cirrhosis, anemia, AVN left hip, esophageal varices.  Hospital Problem List      Hospital     * (Principal) Abdominal pain likely due to ascites with cirrhosis     Resolved pain.  He is also not stooled since yesterday, though it was a  very small amount.  KUB, EKG, LA, troponins all unremarkable. RUQ U/S  revealed multiple gallstones with wall thickening. Adjacent ascites. The  wall  thickening could relate to level of the ascites. The gallbladder itself is  nondilated. No biliary ductal dilatation. Patent tips shunt without  findings of significant stenosis or abnormal velocity. -GI following, appreciate recommendations -MiraLAX for bowel regimen -HIDA scan results pending -Regular diet -Tylenol as needed -AM CMP        HYPERTENSION, BENIGN SYSTEMIC     Hypertensive on most recent check 140-150s. -Continue home amlodipine -Continue home bisoprolol at reduced dose to 25 mg -Continue Lasix, spironolactone        Ascites due to alcoholic cirrhosis (HCC)     Abdominal distention and accumulation of ascites again. Therapeutic  paracentesis scheduled for patient. Appears patient is going toward  needing regularly scheduled taps.         CKD (chronic kidney disease) stage 4, GFR 15-29 ml/min (HCC)     Creatinine 2.11 on admission. Now 1.77. Recent baseline from 2.0-2.4. -A.m. CMP -Avoid nephrotoxic agents        Typical atrial flutter (HCC)     Rates ranging 40s to 50s while on  reduced dose of bisoprolol, he  recently had 1 dose.  Longstanding bradycardia documented in chart.  Not  on any anticoagulation likely in the setting of history of esophageal  varices. -Continue home bisoprolol at reduced dose 2.5 mg -Cardiac monitoring        Cirrhosis of liver (HCC)     Hx of heavy alcohol use. Last drink 30 years ago, unlikely recent  alcohol use. RUQ cirrhosis and large volume ascites.  CT with ascites.   AST and ALT elevated. S/p TIPS procedure.  Now status post IR tap plan for  admission.  LFTs overall stable this morning, and total bilirubin now 4.4  from 1.8.  Fractionated bilirubin obtained yesterday shows direct  bilirubin at 2.1 and indirect bilirubin at 1.7-consider cirrhosis as  etiology. -Has been started on Lasix and spironolactone for cirrhosis -Continue lactulose -CMP, trend LFTs        Chronic heart failure with preserved ejection fraction (HFpEF) (HCC)     Last echocardiogram in 2022 with EF of 50 to 55%. Repeat echo with LVEF  55 to 60%, severely elevated pulmonary artery pressure; pulmonary with no  further intervention.  No signs of pitting lower extremity edema.  DVT  ultrasound previously negative.  Saturating well on room air on my  examination. -Strict intake and output -Daily weights -A.m. CMP, consider redose of Lasix if creatinine stable or improving        Pancreatitis, alcoholic, acute     Elevated lipase    FEN/GI:  Regular diet PPx: SCDs Dispo:pending clinical improvement and HIDA scan results.  Subjective:  Patient is walking the halls and states he feels well, he is hoping to leave today.  Objective: Temp:  [98.1 F (36.7 C)-98.5 F (36.9 C)] 98.1 F (36.7 C) (06/15 1603) Pulse Rate:  [45-70] 51 (06/15 1603) Resp:  [16-18] 16 (06/15 1603) BP: (142-169)/(48-67) 158/59 (06/15 1603) SpO2:  [97 %-99 %] 99 % (06/15 1603) Weight:  [78.8 kg] 78.8 kg (06/14 1900) Physical Exam: General: Walking with a walker, NAD,  conversational Cardiovascular: RRR, no murmurs auscultated Respiratory: CTAB, normal WOB Abdomen: Soft, nontender, normoactive bowel sounds Extremities: No pitting edema bilateral lower extremities  Laboratory: Most recent CBC Lab Results  Component Value Date   WBC 6.1 10/28/2022   HGB 10.6 (L) 10/28/2022   HCT 31.3 (L) 10/28/2022   MCV 98.4 10/28/2022   PLT 142 (L) 10/28/2022   Most recent BMP    Latest Ref Rng & Units 10/29/2022    2:05 AM  BMP  Glucose 70 - 99 mg/dL 68   BUN 8 - 23 mg/dL 24   Creatinine 1.61 - 1.24 mg/dL 0.96   Sodium 045 - 409 mmol/L 137   Potassium 3.5 - 5.1 mmol/L 3.9   Chloride 98 - 111 mmol/L 106   CO2 22 - 32 mmol/L 22   Calcium 8.9 - 10.3 mg/dL 8.5    Imaging/Diagnostic Tests: RUQ U/S: Multiple gallstones with wall thickening. Adjacent ascites. The wall  thickening could relate to level of the ascites. The gallbladder itself is nondilated. No biliary ductal dilatation. Patent tips shunt without findings of significant stenosis or abnormal velocity.  Donald Mattocks, DO 10/29/2022, 4:43 PM  PGY-2, [facility] FPTS Intern pager: [phone], text pages welcome Secure chat group [facility] Teaching Service

## 2022-10-29 NOTE — Progress Notes (Signed)
DISCHARGE NOTE HOME Kerwin Poehlman to be discharged Home per MD order. Discharge instructions discussed including, diagnosis, treatment, medications list, and follow up appointments discussed.  Skin clean, dry and intact without evidence of skin break down, no evidence of skin tears noted. IV catheter discontinued intact. Site without signs and symptoms of complications. Dressing and pressure applied. Pt denies pain at the site currently. No complaints noted. VSS. Medications returned to patient from pharmacy.  An After Visit Summary (AVS) was printed and given to the patient.   Tresa Endo, RN

## 2022-10-29 NOTE — Assessment & Plan Note (Signed)
Resolved pain.  He is also not stooled since yesterday, though it was a very small amount.  KUB, EKG, LA, troponins all unremarkable. RUQ U/S revealed multiple gallstones with wall thickening. Adjacent ascites. The wall  thickening could relate to level of the ascites. The gallbladder itself is nondilated. No biliary ductal dilatation. Patent tips shunt without findings of significant stenosis or abnormal velocity. -GI following, appreciate recommendations -MiraLAX for bowel regimen -HIDA scan results pending -Regular diet -Tylenol as needed -AM CMP

## 2022-10-29 NOTE — Assessment & Plan Note (Signed)
Hx of heavy alcohol use. Last drink 30 years ago, unlikely recent alcohol use. RUQ cirrhosis and large volume ascites.  CT with ascites.  AST and ALT elevated. S/p TIPS procedure.  Now status post IR tap plan for admission.  LFTs overall stable this morning, and total bilirubin now 4.4 from 1.8.  Fractionated bilirubin obtained yesterday shows direct bilirubin at 2.1 and indirect bilirubin at 1.7-consider cirrhosis as etiology. -Has been started on Lasix and spironolactone for cirrhosis -Continue lactulose -CMP, trend LFTs 

## 2022-10-29 NOTE — Consult Note (Addendum)
[facility] Consult  Referring Provider: No ref. provider found Primary Care Physician:  Evelena Leyden, DO Primary Gastroenterologist: [facility] (Dr. Dulce Sellar)  Reason for Consultation: abdominal pain  SUBJECTIVE:   HPI: Donald Moreno is a 82 y.o. male with past medical history significant for cirrhosis secondary to alcohol decompensated by ascites and esophageal varices. He is status post TIPs procedure. Presented to hospital with chief complaint of 1 week of abdominal pain, located in bilateral upper quadrants, no aggravating factors. Pain is completely resolved this AM, he is hoping to be discharged. He noted having some constipation, no melena or hematochezia. Noted that he has chronic lower extremity edema (R>L), edema can extend to his hip, he takes diuretic medications and urinates frequently. No chest pain or shortness of breath. No alcohol use for > 20 years.  Labs show Hgb 10.6, PLT 142, AST/ALT 80/62, ALP 259, total bilirubin 4.4, INR 1.4, ammonia level 50, INR 1.4. Paracentesis 10/26/22 removed roughly 5 liters of chlylous fluid, negative for SBP with PMN count 4.5, culture negative to date.  Abdominal ultrasound imaging 10/28/22 showed multiple choleliths, adjacent ascites, wall thickening (question relation to ascites), no biliary ductal dilatation, patent TIPS.   GI was contacted yesterday evening for further recommendations, HIDA scan with ejection fraction was recommended, this is pending.   EGD/COL 11/14/2014 (Dr. Dulce Sellar) with findings of grade II varices, moderate portal hypertensive gastropathy, normal appearing duodenum, transverse colon non-bleeding AVM.  Past Medical History:  Diagnosis Date   Acute congestive heart failure (HCC)    Arthritis    "all over"   Asthma    Blood in stool 04/11/2017   Chronic kidney disease    "I see a kidney dr @ [facility]" (10/11/2016)   Chronic lower back pain    "turned a truck over a long time ago" (10/11/2016)    Dysrhythmia    bradycardia with occassional junctional rhythm   ETOH abuse    Gout    Hypertension    Inguinal hernia 10/28/2014   S/P inguinal hernia repair with 4L as ascites removed 03/30/17   Liver cirrhosis (HCC) 2015   Macrocytosis    Pneumonia    "long long time ago" (10/11/2016)   Spontaneous bacterial peritonitis (HCC) 07/01/2015   Past Surgical History:  Procedure Laterality Date   INGUINAL HERNIA REPAIR Bilateral 03/30/2017   Procedure: OPEN BILATERAL INGUINAL HERNIA REPAIR WITH MESH;  Surgeon: Kinsinger, De Blanch, MD;  Location: [facility];  Service: General;  Laterality: Bilateral;  GENERAL COMBINED WITH REGIONAL FOR POST OP PAIN    INSERTION OF MESH N/A 10/16/2016   Procedure: INSERTION OF MESH;  Surgeon: Kinsinger, De Blanch, MD;  Location: [facility];  Service: General;  Laterality: N/A;   INSERTION OF MESH Bilateral 03/30/2017   Procedure: INSERTION OF MESH;  Surgeon: Sheliah Hatch De Blanch, MD;  Location: [facility];  Service: General;  Laterality: Bilateral;  GENERAL COMBINED WITH REGIONAL FOR POST OP PAIN    IR PARACENTESIS  08/19/2016   IR PARACENTESIS  08/26/2016   IR PARACENTESIS  09/02/2016   IR PARACENTESIS  09/09/2016   IR PARACENTESIS  09/16/2016   IR PARACENTESIS  10/03/2016   IR PARACENTESIS  10/11/2016   IR PARACENTESIS  10/26/2022   IR RADIOLOGIST EVAL & MGMT  09/29/2016   IR RADIOLOGIST EVAL & MGMT  12/21/2016   IR RADIOLOGIST EVAL & MGMT  01/23/2018   IR RADIOLOGIST EVAL & MGMT  02/19/2019   IR RADIOLOGIST EVAL & MGMT  03/30/2020   IR  RADIOLOGIST EVAL & MGMT  03/17/2021   IR TIPS  10/11/2016   RADIOLOGY WITH ANESTHESIA N/A 10/11/2016   Procedure: TIPS;  Surgeon: Oley Balm, MD;  Location: [facility];  Service: Radiology;  Laterality: N/A;   UMBILICAL HERNIA REPAIR N/A 10/16/2016   Procedure: HERNIA REPAIR UMBILICAL ADULT;  Surgeon: Kinsinger, De Blanch, MD;  Location: [facility];  Service: General;  Laterality: N/A;   Prior to Admission medications   Medication Sig Start Date End  Date Taking? Authorizing Provider  allopurinol (ZYLOPRIM) 100 MG tablet TAKE 1/2 TABLET BY MOUTH EVERY DAY Patient taking differently: Take 50 mg by mouth daily. 06/16/22  Yes Lilland, Alana, DO  amLODipine (NORVASC) 5 MG tablet Take 1 tablet (5 mg total) by mouth daily. 05/12/22  Yes Croitoru, Mihai, MD  bisoprolol (ZEBETA) 5 MG tablet Take 1 tablet by mouth daily 04/27/22  Yes Croitoru, Mihai, MD  Cholecalciferol (VITAMIN D-3) 5000 units TABS Take 1,000 Units by mouth daily.   Yes [provider]  montelukast (SINGULAIR) 10 MG tablet TAKE 1 TABLET BY MOUTH EVERYDAY AT BEDTIME Patient taking differently: Take 10 mg by mouth at bedtime. 07/15/22  Yes Lilland, Alana, DO  traMADol (ULTRAM) 50 MG tablet TAKE 1 TABLET BY MOUTH EVERY DAY AT BEDTIME AS NEEDED 10/11/22  Yes Lilland, Alana, DO  WIXELA INHUB 500-50 MCG/ACT AEPB TAKE 1 PUFF BY MOUTH TWICE A DAY Patient taking differently: Inhale 1 puff into the lungs in the morning and at bedtime. 08/17/22  Yes Littie Deeds, MD  albuterol [facility] HFA) 108 (90 Base) MCG/ACT inhaler Inhale 2 puffs into the lungs every 4 (four) hours as needed for wheezing. Patient not taking: Reported on 04/27/2022 05/24/18   Shirley, [place], DO  furosemide (LASIX) 40 MG tablet TAKE 1 TAB 3 TIMES A DAY FOR WEIGHT >168LB 2 TIMES A DAY FOR WEIGHT 163-168LB ONCE DAILY FOR WEIGHT <163LB 12/24/18 07/20/19  Shirley, [place], DO   Current Facility-Administered Medications  Medication Dose Route Frequency Provider Last Rate Last Admin   acetaminophen (TYLENOL) tablet 500 mg  500 mg Oral Q6H PRN [name], DO   500 mg at 10/28/22 0114   Or   acetaminophen (TYLENOL) suppository 325 mg  325 mg Rectal Q6H PRN [name], DO       allopurinol (ZYLOPRIM) tablet 50 mg  50 mg Oral Daily Levin Erp, MD   50 mg at 10/27/22 0914   bisoprolol (ZEBETA) tablet 2.5 mg  2.5 mg Oral Daily Evette Georges, MD   2.5 mg at 10/27/22 6962   cholecalciferol (VITAMIN D3) 25 MCG (1000 UNIT) tablet  1,000 Units  1,000 Units Oral Daily Levin Erp, MD   1,000 Units at 10/27/22 0914   enoxaparin (LOVENOX) injection 30 mg  30 mg Subcutaneous Q24H Vonna Drafts, MD   30 mg at 10/28/22 1211   fluticasone furoate-vilanterol (BREO ELLIPTA) 200-25 MCG/ACT 1 puff  1 puff Inhalation Daily Levin Erp, MD   1 puff at 10/29/22 0806   furosemide (LASIX) tablet 20 mg  20 mg Oral Daily Vonna Drafts, MD   20 mg at 10/28/22 1637   lactulose (CHRONULAC) 10 GM/15ML solution 10 g  10 g Oral Daily Bess Kinds, MD   10 g at 10/27/22 1206   montelukast (SINGULAIR) tablet 10 mg  10 mg Oral QHS Levin Erp, MD   10 mg at 10/28/22 2145   multivitamin with minerals tablet 1 tablet  1 tablet Oral Daily Levin Erp, MD   1 tablet at 10/27/22 [phone]  polyethylene glycol (MIRALAX / GLYCOLAX) packet 17 g  17 g Oral Daily Levin Erp, MD   17 g at 10/28/22 1637   spironolactone (ALDACTONE) tablet 25 mg  25 mg Oral Daily Vonna Drafts, MD   25 mg at 10/28/22 1637   traMADol (ULTRAM) tablet 50 mg  50 mg Oral QHS PRN Levin Erp, MD   50 mg at 10/28/22 0144   Allergies as of 10/25/2022   (No Known Allergies)   Family History  Problem Relation Age of Onset   Other Mother        died when pt was only 58 - unknown cause.   Other Father        deceased.   Asthma Sister    Heart disease Sister        multiple stents.   Asthma Brother    Hypertension Brother    Asthma Son    Asthma Brother    Hypertension Brother    Social History   Socioeconomic History   Marital status: Divorced    Spouse name: Not on file   Number of children: 5   Years of education: 10   Highest education level: Not on file  Occupational History   Occupation: Retired- truck Hospital doctor  Tobacco Use   Smoking status: Never    Passive exposure: Current   Smokeless tobacco: Current    Types: Snuff, Chew   Tobacco comments:    Started chewing tobacco at 81 years old  Vaping Use   Vaping Use: Never used   Substance and Sexual Activity   Alcohol use: No    Comment: quit drinking "over 2 yr ago" (10/11/2016)   Drug use: No   Sexual activity: Not Currently  Other Topics Concern   Not on file  Social History Narrative   Lives with nephew and niece in [place] home.     Tobacco: snuff all day (can last two days. Never smoked.       Hobbies: cooking, sleeping, going to store   Pets:  Dog, Bean   Social Determinants of Health   Financial Resource Strain: Not on file  Food Insecurity: No Food Insecurity (10/26/2022)   Hunger Vital Sign    Worried About Running Out of Food in the Last Year: Never true    Ran Out of Food in the Last Year: Never true  Transportation Needs: No Transportation Needs (10/26/2022)   PRAPARE - Administrator, Civil Service (Medical): No    Lack of Transportation (Non-Medical): No  Physical Activity: Not on file  Stress: Not on file  Social Connections: Not on file  Intimate Partner Violence: Not At Risk (10/26/2022)   Humiliation, Afraid, Rape, and Kick questionnaire    Fear of Current or Ex-Partner: No    Emotionally Abused: No    Physically Abused: No    Sexually Abused: No   Review of Systems:  Review of Systems  Constitutional:  Negative for chills, fever and weight loss.  Respiratory:  Negative for shortness of breath.   Cardiovascular:  Positive for leg swelling. Negative for chest pain.  Gastrointestinal:  Positive for abdominal pain and constipation. Negative for blood in stool, melena, nausea and vomiting.    OBJECTIVE:   Temp:  [98.4 F (36.9 C)-98.5 F (36.9 C)] 98.4 F (36.9 C) (06/15 0806) Pulse Rate:  [45-70] 70 (06/15 0806) Resp:  [16-18] 16 (06/15 0806) BP: (142-169)/(48-77) 142/67 (06/15 0806) SpO2:  [97 %-100 %] 97 % (06/15 0806) Weight:  [  78.8 kg] 78.8 kg (06/14 1900) Last BM Date : 10/24/22 Physical Exam Constitutional:      General: He is not in acute distress.    Appearance: He is not ill-appearing, toxic-appearing  or diaphoretic.  Cardiovascular:     Rate and Rhythm: Normal rate. Rhythm irregular.  Pulmonary:     Effort: No respiratory distress.     Breath sounds: Normal breath sounds.     Comments: No supplemental oxygen Abdominal:     General: Bowel sounds are normal. There is no distension.     Palpations: Abdomen is soft.     Tenderness: There is no abdominal tenderness. There is no guarding.  Musculoskeletal:     Right lower leg: Edema present.     Left lower leg: Edema present.  Skin:    General: Skin is warm and dry.  Neurological:     Mental Status: He is alert.     Labs: Recent Labs    10/27/22 0918 10/28/22 0431  WBC 5.1 6.1  HGB 11.2* 10.6*  HCT 33.3* 31.3*  PLT 139* 142*   BMET Recent Labs    10/27/22 0918 10/28/22 0215 10/29/22 0205  NA 135 137 137  K 4.5 4.1 3.9  CL 108 106 106  CO2 20* 23 22  GLUCOSE 87 82 68*  BUN 27* 25* 24*  CREATININE 2.08* 1.92* 1.77*  CALCIUM 8.7* 8.9 8.5*   LFT Recent Labs    10/27/22 0919 10/28/22 0215 10/29/22 0205  PROT  --    < > 5.3*  ALBUMIN  --    < > 2.3*  AST  --    < > 52*  ALT  --    < > 49*  ALKPHOS  --    < > 223*  BILITOT 3.8*   < > 3.2*  BILIDIR 2.1*  --   --   IBILI 1.7*  --   --    < > = values in this interval not displayed.   PT/INR No results for input(s): "LABPROT", "INR" in the last 72 hours.  Diagnostic imaging: US Abdomen Limited RUQ (LIVER/GB)  Result Date: 10/28/2022 CLINICAL DATA:  Abdominal pain EXAM: ULTRASOUND ABDOMEN LIMITED RIGHT UPPER QUADRANT COMPARISON:  CT 10/25/2022.  Ultrasound 10/26/2022. FINDINGS: Gallbladder: Gallbladder mildly distended with multiple stones. There is wall thickening measuring up to 4 mm. Adjacent fluid with the ascites. Common bile duct: Diameter: 6 mm, unchanged from prior Liver: No focal lesion identified. Within normal limits in parenchymal echogenicity. There is a tips shunt in place. Flow in the tips seen diffusely on color Doppler. Velocity along the  proximal aspect of the tips of 107, mid 188 and distal 161 centimeters/second. Appropriate direction flow in the tips. The main portal vein is patent with velocity of 31 centimeters/second. Preserved flow in the central left portal vein and right. Preserved flow in the left, middle and right portal vein. Preserved waveform of the hepatic artery with peak systolic velocity of 128 centimeters/second. Diameter of the main portal vein of 16 mm. Other: None. IMPRESSION: Multiple gallstones with wall thickening. Adjacent ascites. The wall thickening could relate to level of the ascites. The gallbladder itself is nondilated. No biliary ductal dilatation. Patent tips shunt without findings of significant stenosis or abnormal velocity. Electronically Signed   By: Karen Kays M.D.   On: 10/28/2022 17:23   US LIVER DOPPLER  Result Date: 10/28/2022 CLINICAL DATA:  Abdominal pain EXAM: ULTRASOUND ABDOMEN LIMITED RIGHT UPPER QUADRANT COMPARISON:  CT 10/25/2022.  Ultrasound 10/26/2022. FINDINGS: Gallbladder: Gallbladder mildly distended with multiple stones. There is wall thickening measuring up to 4 mm. Adjacent fluid with the ascites. Common bile duct: Diameter: 6 mm, unchanged from prior Liver: No focal lesion identified. Within normal limits in parenchymal echogenicity. There is a tips shunt in place. Flow in the tips seen diffusely on color Doppler. Velocity along the proximal aspect of the tips of 107, mid 188 and distal 161 centimeters/second. Appropriate direction flow in the tips. The main portal vein is patent with velocity of 31 centimeters/second. Preserved flow in the central left portal vein and right. Preserved flow in the left, middle and right portal vein. Preserved waveform of the hepatic artery with peak systolic velocity of 128 centimeters/second. Diameter of the main portal vein of 16 mm. Other: None. IMPRESSION: Multiple gallstones with wall thickening. Adjacent ascites. The wall thickening could relate  to level of the ascites. The gallbladder itself is nondilated. No biliary ductal dilatation. Patent tips shunt without findings of significant stenosis or abnormal velocity. Electronically Signed   By: Karen Kays M.D.   On: 10/28/2022 17:23   DG Abd 1 View  Result Date: 10/28/2022 CLINICAL DATA:  82 year old male with sudden saw onset abdominal pain at paracentesis site. EXAM: ABDOMEN - 1 VIEW COMPARISON:  CT Abdomen and Pelvis 10/26/2022 and earlier. FINDINGS: Portable AP supine view at 0350 hours. TIPS. Aortoiliac calcified atherosclerosis. Lung bases appear negative. Bowel gas pattern nonobstructed, similar to the recent CT. Some decrease in visible abdominal and pelvic visceral contours likely due to some residual ascites. Stable visualized osseous structures. Advanced bilateral femoral head AVN, hip degeneration. IMPRESSION: 1. No acute radiographic finding. 2. TIPS. Aortic Atherosclerosis (ICD10-I70.0). Advanced bilateral femoral head AVN, hip degeneration. Electronically Signed   By: Odessa Fleming M.D.   On: 10/28/2022 04:57    IMPRESSION: EtOH cirrhosis d/b ascites and varices s/p TIPS procedure  -No current EtOH use  -Noted that he is on diuretic therapy prior to admission (I do not see this Rx)  -Paracentesis 10/26/22 negative for SBP  -Last EGD 11/14/2014 Transaminase elevation in mixed pattern Normocytic anemia, no signs of acute GI blood loss Thrombocytopenia Congestive heart failure Chronic kidney disease Asthma Hypertension  PLAN: -Question if pain is biliary colic in nature, imaging thus far does not show biliary ductal dilatation, gallbladder wall with thickening, patent TIPS, recommend follow up HIDA scan testing -Recommend increase furosemide dosing to 40 mg PO daily as still with lower extremity edema and in setting of ascites -Continue spironolactone 25 mg PO daily -No sign of hepatic encephalopathy, continue lactulose, patient has history of constipation -Recommend check AFP  for completeness -Will need follow up in outpatient setting with [facility] (Dr. Dulce Sellar) as he is overdue for EGD to survey esophageal varices  -Recommend low sodium diet (<2 gm per day) [facility] will follow   LOS: 3 days   Liliane Shi, [facility]

## 2022-10-29 NOTE — Assessment & Plan Note (Signed)
Rates ranging 40s to 50s while on reduced dose of bisoprolol, he recently had 1 dose.  Longstanding bradycardia documented in chart.  Not on any anticoagulation likely in the setting of history of esophageal varices. -Continue home bisoprolol at reduced dose 2.5 mg -Cardiac monitoring 

## 2022-10-29 NOTE — Assessment & Plan Note (Signed)
Hypertensive on most recent check 140-150s. -Continue home amlodipine -Continue home bisoprolol at reduced dose to 25 mg -Continue Lasix, spironolactone

## 2022-10-29 NOTE — Assessment & Plan Note (Signed)
Creatinine 2.11 on admission. Now 1.77. Recent baseline from 2.0-2.4. -A.m. CMP -Avoid nephrotoxic agents

## 2022-10-31 ENCOUNTER — Ambulatory Visit (INDEPENDENT_AMBULATORY_CARE_PROVIDER_SITE_OTHER): Payer: 59 | Admitting: Family Medicine

## 2022-10-31 ENCOUNTER — Telehealth: Payer: Self-pay

## 2022-10-31 VITALS — BP 151/59 | HR 64 | Ht 70.0 in | Wt 156.2 lb

## 2022-10-31 DIAGNOSIS — K7031 Alcoholic cirrhosis of liver with ascites: Secondary | ICD-10-CM

## 2022-10-31 DIAGNOSIS — I2721 Secondary pulmonary arterial hypertension: Secondary | ICD-10-CM | POA: Diagnosis not present

## 2022-10-31 DIAGNOSIS — I10 Essential (primary) hypertension: Secondary | ICD-10-CM

## 2022-10-31 DIAGNOSIS — N184 Chronic kidney disease, stage 4 (severe): Secondary | ICD-10-CM | POA: Diagnosis not present

## 2022-10-31 NOTE — Progress Notes (Signed)
    SUBJECTIVE:   CHIEF COMPLAINT / HPI:   Hospital Follow-up - Admitted from 6/11-6/15 with abdominal pain with alcoholic liver cirrhosis and ascites - Med Changes: bisoprolol decreased to 2.5mg   - Patient reports no further abdominal pain - Leg swelling is stable per patient - Denies any further abdominal swelling  PERTINENT  PMH / PSH: Reviewed  OBJECTIVE:   BP (!) 151/59   Pulse 64   Ht 5\' 10"  (1.778 m)   Wt 156 lb 3.2 oz (70.9 kg)   SpO2 100%   BMI 22.41 kg/m   Gen: well-appearing, NAD elderly male CV:  3+ pitting edema in RLE, 2+ LLE Pulm: breathing comfortably on room air, speaking in full sentences GI: soft, non-tender, non-distended  ASSESSMENT/PLAN:   HYPERTENSION, BENIGN SYSTEMIC BP elevated at 150/50s today.  Given the widened pulse pressure and patient's age I am cautious to be significantly aggressive with blood pressure control.  Discharge summary from recent hospitalization stated that amlodipine was discontinued, but it was not discontinued on his medication list and patient has been taking it without issue.  We will continue the medication at this time. - Continue amlodipine 5 mg daily - Continue home bisoprolol at 2.5 mg daily - Continue Lasix 20mg  and spironolactone 25mg  - BMP and BP check in 1 week  Ascites due to alcoholic cirrhosis (HCC) Patient hospitalized with a reaccumulation of ascites, currently not symptomatic and has no distention. - Encourage patient to follow-up with GI  CKD (chronic kidney disease) stage 4, GFR 15-29 ml/min (HCC) Patient to follow-up with outpatient nephrology. - BMP in 1 week  PAH (pulmonary artery hypertension) (HCC) Noted on echocardiogram.  Pulmonology was consulted in the hospitalization and patient is not a candidate for vasodilator therapy given his advanced cirrhosis.  Patient has diuresis with spironolactone and Lasix as his kidney function allows currently. - Patient to follow-up with outpatient pulm      Evelena Leyden, DO [facility]

## 2022-10-31 NOTE — Assessment & Plan Note (Signed)
Noted on echocardiogram.  Pulmonology was consulted in the hospitalization and patient is not a candidate for vasodilator therapy given his advanced cirrhosis.  Patient has diuresis with spironolactone and Lasix as his kidney function allows currently. - Patient to follow-up with outpatient pulm

## 2022-10-31 NOTE — Transitions of Care (Post Inpatient/ED Visit) (Signed)
10/31/2022  Name: Donald Moreno MRN: [id number] DOB: 1940-06-26  Today's TOC FU Call Status: Today's TOC FU Call Status:: Successful TOC FU Call Competed TOC FU Call Complete Date: 10/31/22  Transition Care Management Follow-up Telephone Call Date of Discharge: 10/29/22 Discharge Facility[facility]) Type of Discharge: Inpatient Admission Primary Inpatient Discharge Diagnosis:: pancreatitis How have you been since you were released from the hospital?: Better Any questions or concerns?: No  Items Reviewed: Did you receive and understand the discharge instructions provided?: Yes Medications obtained,verified, and reconciled?: Yes (Medications Reviewed) Any new allergies since your discharge?: No Dietary orders reviewed?: Yes Do you have support at home?: Yes People in Home: other relative(s)  Medications Reviewed Today: Medications Reviewed Today     Reviewed by Karena Addison, LPN (Licensed Practical Nurse) on 10/31/22 at 1018  Med List Status: <None>   Medication Order Taking? Sig Documenting Provider Last Dose Status Informant  acetaminophen (TYLENOL) 500 MG tablet [id number]  Take 1 tablet (500 mg total) by mouth every 6 (six) hours as needed for mild pain (or Fever >/= 101). [name], DO  Active   albuterol [facility] HFA) 108 [phone] Base) MCG/ACT inhaler [id number] No Inhale 2 puffs into the lungs every 4 (four) hours as needed for wheezing.  Patient not taking: Reported on 04/27/2022   Shirley, [place], DO Not Taking Active Self           Med Note Margo Aye, Fawn Kirk   Sun Oct 04, 2020  7:37 AM)    allopurinol (ZYLOPRIM) 100 MG tablet [id number] No TAKE 1/2 TABLET BY MOUTH EVERY DAY  Patient taking differently: Take 50 mg by mouth daily.   Lilland, Alana, DO 10/25/2022 am Active Self  amLODipine (NORVASC) 5 MG tablet [id number] No Take 1 tablet (5 mg total) by mouth daily. Croitoru, Rachelle Hora, MD 10/25/2022 am Active Self  bisoprolol (ZEBETA) 5 MG tablet [id number]  Take 0.5  tablets (2.5 mg total) by mouth daily. [name], DO  Active   Cholecalciferol (VITAMIN D-3) 5000 units TABS [id number] No Take 1,000 Units by mouth daily. [provider] 10/25/2022 am Active Self  furosemide (LASIX) 20 MG tablet [id number]  Take 1 tablet (20 mg total) by mouth daily. [name], DO  Active   lactulose (CHRONULAC) 10 GM/15ML solution [id number]  Take 15 mLs (10 g total) by mouth daily. [name], DO  Active   montelukast (SINGULAIR) 10 MG tablet [id number] No TAKE 1 TABLET BY MOUTH EVERYDAY AT BEDTIME  Patient taking differently: Take 10 mg by mouth at bedtime.   Lilland, Alana, DO 10/24/2022 pm Active Self  Multiple Vitamin (MULTIVITAMIN WITH MINERALS) TABS tablet [id number]  Take 1 tablet by mouth daily. [name], DO  Active   polyethylene glycol (MIRALAX / GLYCOLAX) 17 g packet [id number]  Take 17 g by mouth daily. [name], DO  Active   spironolactone (ALDACTONE) 25 MG tablet [id number]  Take 1 tablet (25 mg total) by mouth daily. [name], DO  Active   traMADol (ULTRAM) 50 MG tablet [id number] No TAKE 1 TABLET BY MOUTH EVERY DAY AT BEDTIME AS NEEDED Lilland, Alana, DO Past Week Active Self  WIXELA INHUB 500-50 MCG/ACT AEPB [id number] No TAKE 1 PUFF BY MOUTH TWICE A DAY  Patient taking differently: Inhale 1 puff into the lungs in the morning and at bedtime.   Littie Deeds, MD 10/25/2022 am Active Self  Med List Note Birdie Sons, Yehuda Mao, MD 07/29/14 1507):  Home Care and Equipment/Supplies: Were Home Health Services Ordered?: NA Any new equipment or medical supplies ordered?: NA  Functional Questionnaire: Do you need assistance with bathing/showering or dressing?: No Do you need assistance with meal preparation?: No Do you need assistance with eating?: No Do you have difficulty maintaining continence: No Do you need assistance with getting out of bed/getting out of a chair/moving?: No Do you have difficulty managing or  taking your medications?: No  Follow up appointments reviewed: PCP Follow-up appointment confirmed?: Yes Date of PCP follow-up appointment?: 10/31/22 Follow-up Provider[facility] Follow-up appointment confirmed?: No Reason Specialist Follow-Up Not Confirmed: Patient has Specialist Provider Number and will Call for Appointment Do you need transportation to your follow-up appointment?: No Do you understand care options if your condition(s) worsen?: Yes-patient verbalized understanding    SIGNATURE Karena Addison, LPN [facility] Nurse Health Advisor Direct Dial [phone]

## 2022-10-31 NOTE — Assessment & Plan Note (Signed)
Patient to follow-up with outpatient nephrology. - BMP in 1 week

## 2022-10-31 NOTE — Assessment & Plan Note (Addendum)
Patient hospitalized with a reaccumulation of ascites, currently not symptomatic and has no distention. - Encourage patient to follow-up with GI

## 2022-10-31 NOTE — Patient Instructions (Signed)
We are going to keep you on your current medications.  I went ahead and changed the label on your bottle but you will take half the dose of the bisoprolol.  Make sure to schedule follow-up with your GI doctor and nephrologist.  If you do not hear anything in the next week, then go ahead and call their offices to schedule.  We will also want to have you schedule with pulmonology for follow-up as they noticed some elevated pressure readings in your lung arteries that they will want to monitor.  I want you to follow-up in the next week to get your labs checked.

## 2022-10-31 NOTE — Assessment & Plan Note (Signed)
BP elevated at 150/50s today.  Given the widened pulse pressure and patient's age I am cautious to be significantly aggressive with blood pressure control.  Discharge summary from recent hospitalization stated that amlodipine was discontinued, but it was not discontinued on his medication list and patient has been taking it without issue.  We will continue the medication at this time. - Continue amlodipine 5 mg daily - Continue home bisoprolol at 2.5 mg daily - Continue Lasix 20mg  and spironolactone 25mg  - BMP and BP check in 1 week

## 2022-11-02 ENCOUNTER — Other Ambulatory Visit: Payer: Self-pay | Admitting: Gastroenterology

## 2022-11-02 ENCOUNTER — Ambulatory Visit: Payer: 59 | Admitting: Audiologist

## 2022-11-02 DIAGNOSIS — K7031 Alcoholic cirrhosis of liver with ascites: Secondary | ICD-10-CM

## 2022-11-02 DIAGNOSIS — H903 Sensorineural hearing loss, bilateral: Secondary | ICD-10-CM

## 2022-11-02 NOTE — Procedures (Addendum)
  [facility] [address] [place], [place]  [address] [phone]  AUDIOLOGICAL  EVALUATION  NAME: Donald Moreno     DOB:   May 22, 1940      MRN: [id number]                                                                                     DATE: 11/02/2022     REFERENT: Evelena Leyden, DO STATUS: Outpatient DIAGNOSIS: Bilateral Sensorineural Hearing Loss    History: Donald Moreno was seen for an audiological evaluation. Donald Moreno is receiving a hearing evaluation due to concerns for difficulty understanding speech and tinnitus . Donald Moreno has difficulty hearing in both ears. This difficulty began gradually. Donald Moreno often mishears people during his appointments.  No pain or pressure reported in either ear. Tinnitus reported for both ears that occurs "all the time". When asked to explain his tinnitus, Donald Moreno had difficulty providing an explanation beyond "sound in my ear".  Donald Moreno has a history of noise exposure from working in a variety of noisy environments.  Medical history positive for heart disease. No other relevant case history reported.   Evaluation:  Otoscopy showed a clear view of the tympanic membranes, bilaterally Tympanometry results were consistent with normal middle ear function and tympanic membrane movement (Type A), bilaterally   Audiometric testing was completed using conventional audiometry with supraural headphones transducer. Speech Recognition Thresholds were 30 dB in the right ear and 30 dB in the left ear. Word Recognition was performed 70 dB SL, scored 100 % in the right ear and 78% in the left ear. Pure tone thresholds show sloping mild to severe sensorineural hearing loss in the right ear and left ear.   Results:  The test results were reviewed with Donald Moreno. Otoscopy and tympanometry indicate normal outer a middle ear anatomy and function. Audiometry suggests bilateral sloping mild to severe sensorineural hearing loss. Hearing aids were  recommended. Donald Moreno indicated he was interested in hearing aids, he was provided with information to contact his insurance provider to check his hearing aid benefits.   Recommendations: No further audiologic testing is needed unless future hearing concerns arise.  Amplification is necessary for both ears. Donald Moreno was given a handout on how to set up a hearing aid appointment with [facility] since he has [employer]. Copy of audiogram provided. Also gave him provider's card in case his niece has questions.    45 minutes spent testing and counseling on results.   Ammie Ferrier  Audiologist, Au.D., CCC-A 11/02/2022  2:48 PM  Ebbie Cherry Tera Partridge, MS Audiology Student   Cc: Evelena Leyden, DO

## 2022-11-03 ENCOUNTER — Telehealth: Payer: Self-pay

## 2022-11-03 NOTE — Telephone Encounter (Signed)
Patient LVM on nurse line reporting he lost his glasses.   He reports he is not sure if he left them here at his last office visit.   I checked with the front office and no glasses have been turned in.   I attempted to contact patient to inform, however no answer.

## 2022-11-06 ENCOUNTER — Other Ambulatory Visit: Payer: Self-pay | Admitting: Student

## 2022-11-07 ENCOUNTER — Ambulatory Visit (INDEPENDENT_AMBULATORY_CARE_PROVIDER_SITE_OTHER): Payer: 59 | Admitting: Family Medicine

## 2022-11-07 VITALS — BP 134/63 | HR 66 | Ht 70.0 in | Wt 161.2 lb

## 2022-11-07 DIAGNOSIS — I10 Essential (primary) hypertension: Secondary | ICD-10-CM | POA: Diagnosis not present

## 2022-11-07 DIAGNOSIS — I2721 Secondary pulmonary arterial hypertension: Secondary | ICD-10-CM | POA: Diagnosis not present

## 2022-11-07 NOTE — Patient Instructions (Signed)
I have sent in the referral to the lung doctor (pulmonologist) for the pulmonary artery hypertension and the prior small nodule we found on CT imaging in 2023. They should reach out to you in a few weeks, it is not urgent but we will definitely want you to see them sometime in the next few months.   Your blood pressure looks great today and we are going to check your labs to make sure your kidneys are tolerating the medications.

## 2022-11-07 NOTE — Progress Notes (Signed)
    SUBJECTIVE:   CHIEF COMPLAINT / HPI:   HTN - Doing well with no hypotensive symptoms - Has all of his medications as prescribed - Brought excess medications for removal  PAH - Hasn't heard from pulmonology regarding hospital follow-up  PERTINENT  PMH / PSH: Reviewed  OBJECTIVE:   BP 134/63   Pulse 66   Ht 5\' 10"  (1.778 m)   Wt 161 lb 3.2 oz (73.1 kg)   SpO2 100%   BMI 23.13 kg/m   Gen: well-appearing, NAD CV: RRR, no m/r/g appreciated, 3+ pitting edema in RLE and 1+ in LLE Pulm: CTAB, no wheezes/crackles  ASSESSMENT/PLAN:   HYPERTENSION, BENIGN SYSTEMIC BP well-controlled today, will have no changes to his medications.  Needs to have labs checked to ensure kidneys are tolerating. - BMP today  PAH (pulmonary artery hypertension) (HCC) Patient has not yet had follow-up with pulmonology from the hospitalization.  Diagnosis was noted on echocardiogram while hospitalized and patient is not a candidate for vasodilator therapy due to advanced cirrhosis.  Currently on Lasix and spironolactone.  Patient was also on to have a pleural nodule that was 5 mm on prior imaging and will need to be followed - Referral to pulmonology placed     Evelena Leyden, DO [facility]

## 2022-11-07 NOTE — Assessment & Plan Note (Signed)
BP well-controlled today, will have no changes to his medications.  Needs to have labs checked to ensure kidneys are tolerating. - BMP today

## 2022-11-07 NOTE — Assessment & Plan Note (Signed)
Patient has not yet had follow-up with pulmonology from the hospitalization.  Diagnosis was noted on echocardiogram while hospitalized and patient is not a candidate for vasodilator therapy due to advanced cirrhosis.  Currently on Lasix and spironolactone.  Patient was also on to have a pleural nodule that was 5 mm on prior imaging and will need to be followed - Referral to pulmonology placed

## 2022-11-08 ENCOUNTER — Encounter: Payer: Self-pay | Admitting: Podiatry

## 2022-11-08 LAB — BASIC METABOLIC PANEL
BUN/Creatinine Ratio: 21 (ref 10–24)
BUN: 44 mg/dL — ABNORMAL HIGH (ref 8–27)
CO2: 21 mmol/L (ref 20–29)
Calcium: 9.2 mg/dL (ref 8.6–10.2)
Chloride: 107 mmol/L — ABNORMAL HIGH (ref 96–106)
Creatinine, Ser: 2.1 mg/dL — ABNORMAL HIGH (ref 0.76–1.27)
Glucose: 91 mg/dL (ref 70–99)
Potassium: 5.2 mmol/L (ref 3.5–5.2)
Sodium: 143 mmol/L (ref 134–144)
eGFR: 31 mL/min/{1.73_m2} — ABNORMAL LOW (ref >59)

## 2022-11-09 ENCOUNTER — Ambulatory Visit: Payer: 59 | Admitting: Podiatry

## 2022-11-09 ENCOUNTER — Ambulatory Visit (INDEPENDENT_AMBULATORY_CARE_PROVIDER_SITE_OTHER): Payer: 59 | Admitting: Podiatry

## 2022-11-09 ENCOUNTER — Encounter: Payer: Self-pay | Admitting: Podiatry

## 2022-11-09 VITALS — BP 160/66 | HR 62

## 2022-11-09 DIAGNOSIS — M79675 Pain in left toe(s): Secondary | ICD-10-CM

## 2022-11-09 DIAGNOSIS — M79674 Pain in right toe(s): Secondary | ICD-10-CM

## 2022-11-09 DIAGNOSIS — B351 Tinea unguium: Secondary | ICD-10-CM | POA: Diagnosis not present

## 2022-11-10 NOTE — Progress Notes (Signed)
Subjective:   Patient ID: Donald Moreno, male   DOB: 82 y.o.   MRN: [id number]   HPI Patient presents with elongated nailbeds 1-5 both feet that are thick and brittle   ROS      Objective:  Physical Exam  Neurovascular status intact with thick yellow brittle nailbeds 1-5 both feet painful     Assessment:  Mycotic nail infection with pitted nailbeds 1-5 both feet     Plan:  H&P reviewed debrided nailbeds 1-5 both feet no angiogenic bleeding reappoint routine care

## 2022-11-18 ENCOUNTER — Ambulatory Visit
Admission: RE | Admit: 2022-11-18 | Discharge: 2022-11-18 | Disposition: A | Discharge status: Home / Self Care | Payer: 59 | Source: Ambulatory Visit | Attending: Gastroenterology | Admitting: Gastroenterology

## 2022-11-18 DIAGNOSIS — K7031 Alcoholic cirrhosis of liver with ascites: Secondary | ICD-10-CM

## 2022-11-18 NOTE — Progress Notes (Signed)
Patient ID: Donald Moreno, male   DOB: [birth year], 82 y.o.   MRN: [id number]       Chief Complaint: Patient was consulted remotely today (TeleHealth) for ascites development post TIPS  at the request of Outlaw,William.    Referring Physician(s): Outlaw,William  History of Present Illness: Donald Moreno is a 82 y.o. male with a long history of cirrhosis and portal venous hypertension.  He had TIPS creation 09/30/2016 because of recurrent large volume abdominal ascites requiring multiple ultrasound-guided paracentesis procedures. For many years He   had no need for paracentesis since his procedure.   He was recently admitted through ED on 10/26/22 for abd pain, pitting edema,  and recurrent ascites. He had large volume paracentesis 5.1L with significant resolution of symptoms. Cytology and culture negative. [place] of TIPS showed continued flow and good velocities, no suggesting of high grade stenosis or occlusion. No portal vein thrombosis. Gallstones noted. Currently he is doing well at home, ambulating daily, elevating legs at home. No abd pain or distention currently.   Past Medical History:  Diagnosis Date   Acute congestive heart failure (HCC)    Arthritis    "all over"   Asthma    Blood in stool 04/11/2017   Chronic kidney disease    "I see a kidney dr @ [facility]" (10/11/2016)   Chronic lower back pain    "turned a truck over a long time ago" (10/11/2016)   Dysrhythmia    bradycardia with occassional junctional rhythm   ETOH abuse    Gout    Hypertension    Inguinal hernia 10/28/2014   S/P inguinal hernia repair with 4L as ascites removed 03/30/17   Liver cirrhosis (HCC) 2015   Macrocytosis    Pneumonia    "long long time ago" (10/11/2016)   Spontaneous bacterial peritonitis (HCC) 07/01/2015    Past Surgical History:  Procedure Laterality Date   INGUINAL HERNIA REPAIR Bilateral 03/30/2017   Procedure: OPEN BILATERAL INGUINAL HERNIA REPAIR WITH MESH;  Surgeon: Kinsinger,  De Blanch, MD;  Location: [facility];  Service: General;  Laterality: Bilateral;  GENERAL COMBINED WITH REGIONAL FOR POST OP PAIN    INSERTION OF MESH N/A 10/16/2016   Procedure: INSERTION OF MESH;  Surgeon: Kinsinger, De Blanch, MD;  Location: [facility] OR;  Service: General;  Laterality: N/A;   INSERTION OF MESH Bilateral 03/30/2017   Procedure: INSERTION OF MESH;  Surgeon: Sheliah Hatch De Blanch, MD;  Location: [facility];  Service: General;  Laterality: Bilateral;  GENERAL COMBINED WITH REGIONAL FOR POST OP PAIN    IR PARACENTESIS  08/19/2016   IR PARACENTESIS  08/26/2016   IR PARACENTESIS  09/02/2016   IR PARACENTESIS  09/09/2016   IR PARACENTESIS  09/16/2016   IR PARACENTESIS  10/03/2016   IR PARACENTESIS  10/11/2016   IR PARACENTESIS  10/26/2022   IR RADIOLOGIST EVAL & MGMT  09/29/2016   IR RADIOLOGIST EVAL & MGMT  12/21/2016   IR RADIOLOGIST EVAL & MGMT  01/23/2018   IR RADIOLOGIST EVAL & MGMT  02/19/2019   IR RADIOLOGIST EVAL & MGMT  03/30/2020   IR RADIOLOGIST EVAL & MGMT  03/17/2021   IR TIPS  10/11/2016   RADIOLOGY WITH ANESTHESIA N/A 10/11/2016   Procedure: TIPS;  Surgeon: Oley Balm, MD;  Location: [facility] OR;  Service: Radiology;  Laterality: N/A;   UMBILICAL HERNIA REPAIR N/A 10/16/2016   Procedure: HERNIA REPAIR UMBILICAL ADULT;  Surgeon: Kinsinger, De Blanch, MD;  Location: [facility] OR;  Service: General;  Laterality: N/A;  Allergies: Patient has no known allergies.  Medications: Prior to Admission medications   Medication Sig Start Date End Date Taking? Authorizing Provider  acetaminophen (TYLENOL) 500 MG tablet Take 1 tablet (500 mg total) by mouth every 6 (six) hours as needed for mild pain (or Fever >/= 101). 10/29/22   [name], DO  albuterol (PROAIR HFA) 108 (90 Base) MCG/ACT inhaler Inhale 2 puffs into the lungs every 4 (four) hours as needed for wheezing. 05/24/18   Shirley, [place], DO  allopurinol (ZYLOPRIM) 100 MG tablet TAKE 1/2 TABLET BY MOUTH EVERY DAY Patient taking differently: Take  50 mg by mouth daily. 06/16/22   Lilland, Alana, DO  amLODipine (NORVASC) 5 MG tablet Take 1 tablet (5 mg total) by mouth daily. 05/12/22   Croitoru, Mihai, MD  bisoprolol (ZEBETA) 5 MG tablet Take 0.5 tablets (2.5 mg total) by mouth daily. 10/30/22   [name], DO  Cholecalciferol (VITAMIN D-3) 5000 units TABS Take 1,000 Units by mouth daily.    [provider]  furosemide (LASIX) 20 MG tablet Take 1 tablet (20 mg total) by mouth daily. 10/29/22   [name], DO  lactulose (CHRONULAC) 10 GM/15ML solution TAKE 15 MLS (10 G TOTAL) BY MOUTH DAILY. 11/07/22   Lilland, Alana, DO  montelukast (SINGULAIR) 10 MG tablet TAKE 1 TABLET BY MOUTH EVERYDAY AT BEDTIME 07/15/22   Lilland, Alana, DO  Multiple Vitamin (MULTIVITAMIN WITH MINERALS) TABS tablet Take 1 tablet by mouth daily. 10/29/22   [name], DO  polyethylene glycol (MIRALAX / GLYCOLAX) 17 g packet Take 17 g by mouth daily. 10/29/22   [name], DO  spironolactone (ALDACTONE) 25 MG tablet Take 1 tablet (25 mg total) by mouth daily. 10/29/22   [name], DO  traMADol (ULTRAM) 50 MG tablet TAKE 1 TABLET BY MOUTH EVERY DAY AT BEDTIME AS NEEDED 10/11/22   Lilland, Alana, DO  WIXELA INHUB 500-50 MCG/ACT AEPB TAKE 1 PUFF BY MOUTH TWICE A DAY Patient taking differently: Inhale 1 puff into the lungs in the morning and at bedtime. 08/17/22   Littie Deeds, MD     Family History  Problem Relation Age of Onset   Other Mother        died when pt was only 40 - unknown cause.   Other Father        deceased.   Asthma Sister    Heart disease Sister        multiple stents.   Asthma Brother    Hypertension Brother    Asthma Son    Asthma Brother    Hypertension Brother     Social History   Socioeconomic History   Marital status: Divorced    Spouse name: Not on file   Number of children: 5   Years of education: 10   Highest education level: Not on file  Occupational History   Occupation: Retired- truck Hospital doctor  Tobacco Use    Smoking status: Never    Passive exposure: Current   Smokeless tobacco: Current    Types: Snuff, Chew   Tobacco comments:    Started chewing tobacco at 82 years old     Currently dip snuff only at this time - 11/09/22  Vaping Use   Vaping Use: Never used  Substance and Sexual Activity   Alcohol use: No    Comment: quit drinking "over 2 yr ago" (10/11/2016)   Drug use: No   Sexual activity: Not Currently  Other Topics Concern   Not on file  Social History Narrative   Lives with nephew and niece in [place].     Tobacco: snuff all day (can last two days. Never smoked.       Hobbies: cooking, sleeping, going to store   Pets:  Dog, Bean   Social Determinants of Health   Financial Resource Strain: Not on file  Food Insecurity: No Food Insecurity (10/26/2022)   Hunger Vital Sign    Worried About Running Out of Food in the Last Year: Never true    Ran Out of Food in the Last Year: Never true  Transportation Needs: No Transportation Needs (10/26/2022)   PRAPARE - Administrator, Civil Service (Medical): No    Lack of Transportation (Non-Medical): No  Physical Activity: Not on file  Stress: Not on file  Social Connections: Not on file    ECOG Status: 1 - Symptomatic but completely ambulatory  Review of Systems  Review of Systems: A 12 point ROS discussed and pertinent positives are indicated in the HPI above.  All other systems are negative.  Physical Exam No direct physical exam was performed (except for noted visual exam findings with Video Visits).    Vital Signs: There were no vitals taken for this visit.  Imaging: NM Hepato W/EF  Result Date: 10/29/2022 CLINICAL DATA:  Abdominal pain, upper, chronic, assess gallbladder motility EXAM: NUCLEAR MEDICINE HEPATOBILIARY IMAGING WITH GALLBLADDER EF TECHNIQUE: Sequential images of the abdomen were obtained out to 60 minutes following intravenous administration of radiopharmaceutical. After oral ingestion of  Ensure, gallbladder ejection fraction was determined. At 60 min, normal ejection fraction is greater than 33%. RADIOPHARMACEUTICALS:  7.5 mCi Tc-32m  Choletec IV COMPARISON:  Ultrasound 10/28/2022 FINDINGS: Prompt uptake and biliary excretion of activity by the liver is seen. Gallbladder activity is visualized, consistent with patency of cystic duct. Biliary activity passes into small bowel, consistent with patent common bile duct. Calculated gallbladder ejection fraction is 100%. (Normal gallbladder ejection fraction with Ensure is greater than 33%.) IMPRESSION: 1. Normal scintigraphic hepatobiliary scan. No evidence of cholecystitis. 2. Calculated gallbladder ejection fraction of 100%. Electronically Signed   By: Duanne Guess D.O.   On: 10/29/2022 16:57   US Abdomen Limited RUQ (LIVER/GB)  Result Date: 10/28/2022 CLINICAL DATA:  Abdominal pain EXAM: ULTRASOUND ABDOMEN LIMITED RIGHT UPPER QUADRANT COMPARISON:  CT 10/25/2022.  Ultrasound 10/26/2022. FINDINGS: Gallbladder: Gallbladder mildly distended with multiple stones. There is wall thickening measuring up to 4 mm. Adjacent fluid with the ascites. Common bile duct: Diameter: 6 mm, unchanged from prior Liver: No focal lesion identified. Within normal limits in parenchymal echogenicity. There is a tips shunt in place. Flow in the tips seen diffusely on color Doppler. Velocity along the proximal aspect of the tips of 107, mid 188 and distal 161 centimeters/second. Appropriate direction flow in the tips. The main portal vein is patent with velocity of 31 centimeters/second. Preserved flow in the central left portal vein and right. Preserved flow in the left, middle and right portal vein. Preserved waveform of the hepatic artery with peak systolic velocity of 128 centimeters/second. Diameter of the main portal vein of 16 mm. Other: None. IMPRESSION: Multiple gallstones with wall thickening. Adjacent ascites. The wall thickening could relate to level of the  ascites. The gallbladder itself is nondilated. No biliary ductal dilatation. Patent tips shunt without findings of significant stenosis or abnormal velocity. Electronically Signed   By: Karen Kays M.D.   On: 10/28/2022 17:23   US LIVER DOPPLER  Result Date: 10/28/2022  CLINICAL DATA:  Abdominal pain EXAM: ULTRASOUND ABDOMEN LIMITED RIGHT UPPER QUADRANT COMPARISON:  CT 10/25/2022.  Ultrasound 10/26/2022. FINDINGS: Gallbladder: Gallbladder mildly distended with multiple stones. There is wall thickening measuring up to 4 mm. Adjacent fluid with the ascites. Common bile duct: Diameter: 6 mm, unchanged from prior Liver: No focal lesion identified. Within normal limits in parenchymal echogenicity. There is a tips shunt in place. Flow in the tips seen diffusely on color Doppler. Velocity along the proximal aspect of the tips of 107, mid 188 and distal 161 centimeters/second. Appropriate direction flow in the tips. The main portal vein is patent with velocity of 31 centimeters/second. Preserved flow in the central left portal vein and right. Preserved flow in the left, middle and right portal vein. Preserved waveform of the hepatic artery with peak systolic velocity of 128 centimeters/second. Diameter of the main portal vein of 16 mm. Other: None. IMPRESSION: Multiple gallstones with wall thickening. Adjacent ascites. The wall thickening could relate to level of the ascites. The gallbladder itself is nondilated. No biliary ductal dilatation. Patent tips shunt without findings of significant stenosis or abnormal velocity. Electronically Signed   By: Karen Kays M.D.   On: 10/28/2022 17:23   DG Abd 1 View  Result Date: 10/28/2022 CLINICAL DATA:  82 year old male with sudden saw onset abdominal pain at paracentesis site. EXAM: ABDOMEN - 1 VIEW COMPARISON:  CT Abdomen and Pelvis 10/26/2022 and earlier. FINDINGS: Portable AP supine view at 0350 hours. TIPS. Aortoiliac calcified atherosclerosis. Lung bases appear  negative. Bowel gas pattern nonobstructed, similar to the recent CT. Some decrease in visible abdominal and pelvic visceral contours likely due to some residual ascites. Stable visualized osseous structures. Advanced bilateral femoral head AVN, hip degeneration. IMPRESSION: 1. No acute radiographic finding. 2. TIPS. Aortic Atherosclerosis (ICD10-I70.0). Advanced bilateral femoral head AVN, hip degeneration. Electronically Signed   By: Odessa Fleming M.D.   On: 10/28/2022 04:57   VAS [place] LOWER EXTREMITY VENOUS (DVT)  Result Date: 10/26/2022  Lower Venous DVT Study Patient Name:  BRAEDON CHAMPION  Date of Exam:   10/26/2022 Medical Rec #: [id number]       Accession #:    [id number] Date of Birth: [birth year]        Patient Gender: M Patient Age:   20 years Exam Location:  [facility] Procedure:      VAS [place] LOWER EXTREMITY VENOUS (DVT) Referring Phys: Janit Pagan --------------------------------------------------------------------------------  Indications: Edema, RT>LT.  Comparison Study: No prior studies. Performing Technologist: Jean Rosenthal RDMS, RVT  Examination Guidelines: A complete evaluation includes B-mode imaging, spectral Doppler, color Doppler, and power Doppler as needed of all accessible portions of each vessel. Bilateral testing is considered an integral part of a complete examination. Limited examinations for reoccurring indications may be performed as noted. The reflux portion of the exam is performed with the patient in reverse Trendelenburg.  +---------+---------------+---------+-----------+----------+--------------+ RIGHT    CompressibilityPhasicitySpontaneityPropertiesThrombus Aging +---------+---------------+---------+-----------+----------+--------------+ CFV      Full           Yes      Yes                                 +---------+---------------+---------+-----------+----------+--------------+ SFJ      Full                                                         +---------+---------------+---------+-----------+----------+--------------+  FV Prox  Full                                                        +---------+---------------+---------+-----------+----------+--------------+ FV Mid   Full                                                        +---------+---------------+---------+-----------+----------+--------------+ FV DistalFull                                                        +---------+---------------+---------+-----------+----------+--------------+ PFV      Full                                                        +---------+---------------+---------+-----------+----------+--------------+ POP      Full           Yes      Yes                                 +---------+---------------+---------+-----------+----------+--------------+ PTV      Full                                                        +---------+---------------+---------+-----------+----------+--------------+ PERO     Full                                                        +---------+---------------+---------+-----------+----------+--------------+   +----+---------------+---------+-----------+----------+--------------+ LEFTCompressibilityPhasicitySpontaneityPropertiesThrombus Aging +----+---------------+---------+-----------+----------+--------------+ CFV Full           Yes      Yes                                 +----+---------------+---------+-----------+----------+--------------+     Summary: RIGHT: - There is no evidence of deep vein thrombosis in the lower extremity.  - No cystic structure found in the popliteal fossa.  - Overlying subcutaneous edema noted.  LEFT: - No evidence of common femoral vein obstruction.  *See table(s) above for measurements and observations. Electronically signed by Heath Lark on 10/26/2022 at 4:58:56 PM.    Final    ECHOCARDIOGRAM COMPLETE  Result Date: 10/26/2022    ECHOCARDIOGRAM REPORT    Patient Name:   ELIAHS SPRUIELL Date of Exam: 10/26/2022 Medical Rec #:  [id number]      Height:       70.0 in Accession #:  [id number]     Weight:       173.5 lb Date of Birth:  Dec 04, 1940       BSA:          1.965 m Patient Age:    82 years       BP:           156/73 mmHg Patient Gender: M              HR:           55 bpm. Exam Location:  Inpatient Procedure: 2D Echo, Color Doppler and Cardiac Doppler Indications:    Dyspnea  History:        Patient has prior history of Echocardiogram examinations, most                 recent 04/18/2021. CHF, CKD, LEE, Aclute Alc. Pancreatitis,                 Arrythmias:Atrial Flutter; Risk Factors:Hypertension.  Sonographer:    Milbert Coulter Referring Phys: [id number] Theador Hawthorne ENIOLA IMPRESSIONS  1. Left ventricular ejection fraction, by estimation, is 55 to 60%. The left ventricle has normal function. The left ventricle has no regional wall motion abnormalities. Left ventricular diastolic parameters are indeterminate.  2. Right ventricular systolic function is normal. The right ventricular size is mildly enlarged. There is severely elevated pulmonary artery systolic pressure. The estimated right ventricular systolic pressure is 76.2 mmHg.  3. Patent left atrial appendange on TTE assessment. Left atrial size was mildly dilated.  4. The mitral valve is normal in structure. Mild mitral valve regurgitation. No evidence of mitral stenosis.  5. Tricuspid valve regurgitation is moderate to severe.  6. The aortic valve is tricuspid. Aortic valve regurgitation is trivial. No aortic stenosis is present.  7. The inferior vena cava is dilated in size with <50% respiratory variability, suggesting right atrial pressure of 15 mmHg. Comparison(s): Prior images reviewed side by side. Tricuspid regurgitation has increased. FINDINGS  Left Ventricle: Left ventricular ejection fraction, by estimation, is 55 to 60%. The left ventricle has normal function. The left ventricle has no regional wall motion  abnormalities. The left ventricular internal cavity size was normal in size. There is  no left ventricular hypertrophy. Left ventricular diastolic parameters are indeterminate. Right Ventricle: The right ventricular size is mildly enlarged. No increase in right ventricular wall thickness. Right ventricular systolic function is normal. There is severely elevated pulmonary artery systolic pressure. The tricuspid regurgitant velocity is 3.91 m/s, and with an assumed right atrial pressure of 15 mmHg, the estimated right ventricular systolic pressure is 76.2 mmHg. Left Atrium: Patent left atrial appendange on TTE assessment. Left atrial size was mildly dilated. Right Atrium: Right atrial size was normal in size. Pericardium: Trivial pericardial effusion is present. The pericardial effusion is localized near the left atrium and localized near the right atrium. Mitral Valve: The mitral valve is normal in structure. Mild mitral valve regurgitation. No evidence of mitral valve stenosis. Tricuspid Valve: The tricuspid valve is normal in structure. Tricuspid valve regurgitation is moderate to severe. Aortic Valve: The aortic valve is tricuspid. Aortic valve regurgitation is trivial. No aortic stenosis is present. Aortic valve mean gradient measures 7.5 mmHg. Aortic valve peak gradient measures 13.9 mmHg. Aortic valve area, by VTI measures 1.68 cm. Pulmonic Valve: The pulmonic valve was normal in structure. Pulmonic valve regurgitation is mild. No evidence of pulmonic stenosis. Aorta: The aortic root and ascending aorta are structurally normal, with no evidence  of dilitation. Venous: The inferior vena cava is dilated in size with less than 50% respiratory variability, suggesting right atrial pressure of 15 mmHg. IAS/Shunts: There is left bowing of the interatrial septum, suggestive of elevated right atrial pressure. No atrial level shunt detected by color flow Doppler.  LEFT VENTRICLE PLAX 2D LVIDd:         5.50 cm       Diastology LVIDs:         3.80 cm      LV e' medial:    8.38 cm/s LV PW:         1.10 cm      LV E/e' medial:  18.3 LV IVS:        1.00 cm      LV e' lateral:   9.79 cm/s LVOT diam:     1.90 cm      LV E/e' lateral: 15.6 LV SV:         74 LV SV Index:   38 LVOT Area:     2.84 cm  LV Volumes (MOD) LV vol d, MOD A2C: 79.3 ml LV vol d, MOD A4C: 102.0 ml LV vol s, MOD A2C: 33.2 ml LV vol s, MOD A4C: 41.8 ml LV SV MOD A2C:     46.1 ml LV SV MOD A4C:     102.0 ml LV SV MOD BP:      52.2 ml RIGHT VENTRICLE             IVC RV Basal diam:  4.50 cm     IVC diam: 2.30 cm RV Mid diam:    4.00 cm RV S prime:     10.80 cm/s TAPSE (M-mode): 2.0 cm LEFT ATRIUM             Index        RIGHT ATRIUM           Index LA diam:        3.90 cm 1.98 cm/m   RA Area:     18.90 cm LA Vol (A2C):   60.9 ml 30.99 ml/m  RA Volume:   54.80 ml  27.89 ml/m LA Vol (A4C):   64.6 ml 32.88 ml/m LA Biplane Vol: 62.9 ml 32.01 ml/m  AORTIC VALVE AV Area (Vmax):    1.92 cm AV Area (Vmean):   1.86 cm AV Area (VTI):     1.68 cm AV Vmax:           186.50 cm/s AV Vmean:          121.500 cm/s AV VTI:            0.442 m AV Peak Grad:      13.9 mmHg AV Mean Grad:      7.5 mmHg LVOT Vmax:         126.00 cm/s LVOT Vmean:        79.850 cm/s LVOT VTI:          0.262 m LVOT/AV VTI ratio: 0.59  AORTA Ao Root diam: 2.90 cm Ao Asc diam:  2.90 cm MITRAL VALVE                TRICUSPID VALVE MV Area (PHT): 4.06 cm     TR Peak grad:   61.2 mmHg MV Decel Time: 187 msec     TR Vmax:        391.00 cm/s MV E velocity: 153.00 cm/s MV A velocity: 80.00 cm/s   SHUNTS MV E/A  ratio:  1.91         Systemic VTI:  0.26 m                             Systemic Diam: 1.90 cm Riley Lam MD Electronically signed by Riley Lam MD Signature Date/Time: 10/26/2022/2:22:17 PM    Final    IR Paracentesis  Result Date: 10/26/2022 INDICATION: Abdominal distention. Ascites. Request for diagnostic and therapeutic paracentesis. EXAM: ULTRASOUND GUIDED RIGHT LOWER  QUADRANT PARACENTESIS MEDICATIONS: 1% plain lidocaine, 4 mL COMPLICATIONS: None immediate. PROCEDURE: Informed written consent was obtained from the patient after a discussion of the risks, benefits and alternatives to treatment. A timeout was performed prior to the initiation of the procedure. Initial ultrasound scanning demonstrates a large amount of ascites within the right lower abdominal quadrant. The right lower abdomen was prepped and draped in the usual sterile fashion. 1% lidocaine was used for local anesthesia. Following this, a 19 gauge, 7-cm, Yueh catheter was introduced. An ultrasound image was saved for documentation purposes. The paracentesis was performed. The catheter was removed and a dressing was applied. The patient tolerated the procedure well without immediate post procedural complication. Patient received post-procedure intravenous albumin; see nursing notes for details. FINDINGS: A total of approximately 5.1 L of cloudy, milky, chylous fluid was removed. Samples were sent to the laboratory as requested by the clinical team. IMPRESSION: Successful ultrasound-guided paracentesis yielding 5.1 L liters of chylous peritoneal fluid. PLAN: The patient has previously been formally evaluated by the [facility] and is being actively followed for potential future intervention. He underwent TIPS placement by Dr. Deanne Coffer in 10/11/2016, with last ultrasound TIPS Doppler (06/01/2022) with patent TIPS. We will recommend repeat TIPS Doppler, and ambulatory follow-up in the portal hypertension clinic with Dr. Deanne Coffer. Procedure performed by Brayton El PA-C and supervised by Dr. Roanna Banning [facility] Electronically Signed   By: Roanna Banning M.D.   On: 10/26/2022 10:06   DG ABD ACUTE 2+V W 1V CHEST  Result Date: 10/26/2022 CLINICAL DATA:  Abdominal distension EXAM: DG ABDOMEN ACUTE WITH 1 VIEW  CHEST COMPARISON:  06/09/2021 FINDINGS: Cardiomegaly, vascular congestion. Bibasilar atelectasis or infiltrates, right greater than left. No visible effusions. No acute bony abnormality. Aortic atherosclerosis. IMPRESSION: Cardiomegaly, vascular congestion. Bibasilar atelectasis or infiltrates. Electronically Signed   By: Charlett Nose M.D.   On: 10/26/2022 02:22   CT ABDOMEN PELVIS WO CONTRAST  Result Date: 10/26/2022 CLINICAL DATA:  Bowel obstruction suspected EXAM: CT ABDOMEN AND PELVIS WITHOUT CONTRAST TECHNIQUE: Multidetector CT imaging of the abdomen and pelvis was performed following the standard protocol without IV contrast. RADIATION DOSE REDUCTION: This exam was performed according to the departmental dose-optimization program which includes automated exposure control, adjustment of the mA and/or kV according to patient size and/or use of iterative reconstruction technique. COMPARISON:  10/04/2020 FINDINGS: Lower chest: Small bilateral pleural effusions. Bibasilar airspace opacities, likely atelectasis. Hepatobiliary: Numerous gallstones layering within the gallbladder. No visible biliary ductal dilatation. Small central cyst measuring 1.4 cm, stable. Prior tips. Pancreas: No focal abnormality or ductal dilatation. Spleen: No focal abnormality.  Normal size. Adrenals/Urinary Tract: 1 cm low-density lesion in the lower pole of the right kidney is stable since prior study, likely cyst. No follow-up imaging recommended. No stones or hydronephrosis. Adrenal glands and urinary bladder unremarkable. Stomach/Bowel: Stomach, large and small bowel grossly unremarkable. Vascular/Lymphatic: Aortic atherosclerosis. No evidence of aneurysm or  adenopathy. Reproductive: Mildly prominent prostate Other: Large volume ascites in the abdomen and pelvis, new since prior study. Musculoskeletal: No acute bony abnormality. IMPRESSION: Prior tips within the liver. Large volume ascites in the abdomen and pelvis. Cholelithiasis.  Aortic atherosclerosis. Electronically Signed   By: Charlett Nose M.D.   On: 10/26/2022 02:12   US Abdomen Limited RUQ (LIVER/GB)  Result Date: 10/26/2022 CLINICAL DATA:  Abdominal pain. EXAM: ULTRASOUND ABDOMEN LIMITED RIGHT UPPER QUADRANT COMPARISON:  None Available. FINDINGS: Gallbladder: Multiple shadowing, echogenic gallstones are seen within the gallbladder lumen. The largest measures approximately 1.1 cm. The gallbladder wall measures 3.3 mm in thickness. No sonographic Murphy sign noted by sonographer. Common bile duct: Diameter: 6.1 mm Liver: No focal lesion identified. The liver parenchyma is nodular in echotexture and diffusely increased in echogenicity. Portal vein is patent on color Doppler imaging with normal direction of blood flow towards the liver. Other: A large amount of ascites is present. IMPRESSION: 1. Cholelithiasis, without evidence of acute cholecystitis. 2. Hepatic steatosis and hepatic cirrhosis without focal liver lesions. 3. Ascites. Electronically Signed   By: Aram Candela M.D.   On: 10/26/2022 01:41    Labs:  CBC: Recent Labs    12/15/21 1155 10/25/22 2319 10/27/22 0918 10/28/22 0431  WBC 5.5 5.9 5.1 6.1  HGB 11.0* 10.3* 11.2* 10.6*  HCT 30.8* 30.7* 33.3* 31.3*  PLT 131* 153 139* 142*    COAGS: Recent Labs    10/26/22 0056  INR 1.4*    BMP: Recent Labs    10/26/22 0717 10/27/22 0918 10/28/22 0215 10/29/22 0205 11/07/22 1219  NA 139 135 137 137 143  K 4.7 4.5 4.1 3.9 5.2  CL 107 108 106 106 107*  CO2 19* 20* 23 22 21   GLUCOSE 93 87 82 68* 91  BUN 31* 27* 25* 24* 44*  CALCIUM 9.0 8.7* 8.9 8.5* 9.2  CREATININE 2.13* 2.08* 1.92* 1.77* 2.10*  GFRNONAA 31* 32* 35* 38*  --     LIVER FUNCTION TESTS: Recent Labs    10/25/22 2319 10/27/22 0918 10/27/22 0919 10/28/22 0215 10/29/22 0205  BILITOT 1.8* 4.0* 3.8* 4.4* 3.2*  AST 121* 104*  --  80* 52*  ALT 50* 72*  --  62* 49*  ALKPHOS 143* 242*  --  259* 223*  PROT 6.7 6.5  --  5.8* 5.3*   ALBUMIN 3.1* 2.9*  --  2.6* 2.3*    TUMOR MARKERS: No results for input(s): "AFPTM", "CEA", "CA199", "CHROMGRNA" in the last 8760 hours.  Assessment and Plan:  I am unsure why he developed large volume ascites more than 6 years post TIPS creation. TIPS looks normal on ultrasound with good flow velocities. THis may represent progression of his underlying cirrhosis, or a transient exacerbation.  We can observe for now, since he is symptomatically improved. SHould large volume ascites recur in the short to intermediate term, I would proceed with TIPS venogram to make sure there are no occult stenoses that have developed. We would not want those to progress to occlusion. I reviewed my thoughts and plan with the patient, who seemed to understand, asked appropriate questions, and is agreable. His consistent gratitude for life persists and I take that as a good sign. We will follow up with him in 4-6 months if all goes well.    Thank you for this interesting consult.  I greatly enjoyed meeting Wen Go[place]hea and look forward to participating in their care.  A copy of this report was sent to the  requesting provider on this date.  Electronically Signed: Durwin Glaze 11/18/2022, 2:30 PM   I spent a total of    25 Minutes in remote  clinical consultation, greater than 50% of which was counseling/coordinating care for ascites post TIPS creation.    Visit type: Audio only (telephone). Audio (no video) only due to patient's lack of internet/smartphone capability.  Alternative for in-person consultation at [facility],  [place], [place].   This format is felt to be most appropriate for this patient at this time.  All issues noted in this document were discussed and addressed.  ]

## 2022-11-21 ENCOUNTER — Other Ambulatory Visit: Payer: Self-pay | Admitting: Student

## 2022-12-01 NOTE — Patient Instructions (Addendum)
Mr. Donald Moreno , Thank you for taking time to come for your Medicare Wellness Visit. I appreciate your ongoing commitment to your health goals. Please review the following plan we discussed and let me know if I can assist you in the future.   These are the goals we discussed:  Goals      Blood Pressure < 140/90     Eat more fruits and vegetables, increase vegetables to 1-3 x week.      Reduce daily sodium     Remain active     Weight < 185 lb (83.915 kg)        This is a list of the screening recommended for you and due dates:  Health Maintenance  Topic Date Due   Zoster (Shingles) Vaccine (1 of 2) Never done   DTaP/Tdap/Td vaccine (3 - Tdap) 05/31/2020   COVID-19 Vaccine (6 - 2023-24 season) 10/20/2022   Flu Shot  12/15/2022   Medicare Annual Wellness Visit  12/02/2023   Pneumonia Vaccine  Completed   HPV Vaccine  Aged Out   Hepatitis C Screening  Discontinued    Advanced directives: We have a copy of your advanced directives available in your record should your provider ever need to access them.  Conditions/risks identified: Aim for 30 minutes of exercise or brisk walking, 6-8 glasses of water, and 5 servings of fruits and vegetables each day.  Next appointment: Follow up in one year for your annual wellness visit.   Preventive Care 40 Years and Older, Male  Preventive care refers to lifestyle choices and visits with your health care provider that can promote health and wellness. What does preventive care include? A yearly physical exam. This is also called an annual well check. Dental exams once or twice a year. Routine eye exams. Ask your health care provider how often you should have your eyes checked. Personal lifestyle choices, including: Daily care of your teeth and gums. Regular physical activity. Eating a healthy diet. Avoiding tobacco and drug use. Limiting alcohol use. Practicing safe sex. Taking low doses of aspirin every day. Taking vitamin and mineral  supplements as recommended by your health care provider. What happens during an annual well check? The services and screenings done by your health care provider during your annual well check will depend on your age, overall health, lifestyle risk factors, and family history of disease. Counseling  Your health care provider may ask you questions about your: Alcohol use. Tobacco use. Drug use. Emotional well-being. Home and relationship well-being. Sexual activity. Eating habits. History of falls. Memory and ability to understand (cognition). Work and work Astronomer. Screening  You may have the following tests or measurements: Height, weight, and BMI. Blood pressure. Lipid and cholesterol levels. These may be checked every 5 years, or more frequently if you are over 38 years old. Skin check. Lung cancer screening. You may have this screening every year starting at age 21 if you have a 30-pack-year history of smoking and currently smoke or have quit within the past 15 years. Fecal occult blood test (FOBT) of the stool. You may have this test every year starting at age 2. Flexible sigmoidoscopy or colonoscopy. You may have a sigmoidoscopy every 5 years or a colonoscopy every 10 years starting at age 22. Prostate cancer screening. Recommendations will vary depending on your family history and other risks. Hepatitis C blood test. Hepatitis B blood test. Sexually transmitted disease (STD) testing. Diabetes screening. This is done by checking your blood sugar (glucose) after  you have not eaten for a while (fasting). You may have this done every 1-3 years. Abdominal aortic aneurysm (AAA) screening. You may need this if you are a current or former smoker. Osteoporosis. You may be screened starting at age 1 if you are at high risk. Talk with your health care provider about your test results, treatment options, and if necessary, the need for more tests. Vaccines  Your health care provider  may recommend certain vaccines, such as: Influenza vaccine. This is recommended every year. Tetanus, diphtheria, and acellular pertussis (Tdap, Td) vaccine. You may need a Td booster every 10 years. Zoster vaccine. You may need this after age 72. Pneumococcal 13-valent conjugate (PCV13) vaccine. One dose is recommended after age 19. Pneumococcal polysaccharide (PPSV23) vaccine. One dose is recommended after age 68. Talk to your health care provider about which screenings and vaccines you need and how often you need them. This information is not intended to replace advice given to you by your health care provider. Make sure you discuss any questions you have with your health care provider. Document Released: 05/29/2015 Document Revised: 01/20/2016 Document Reviewed: 03/03/2015 Elsevier Interactive Patient Education  2017 [employer].  Fall Prevention in the Home Falls can cause injuries. They can happen to people of all ages. There are many things you can do to make your home safe and to help prevent falls. What can I do on the outside of my home? Regularly fix the edges of walkways and driveways and fix any cracks. Remove anything that might make you trip as you walk through a door, such as a raised step or threshold. Trim any bushes or trees on the path to your home. Use bright outdoor lighting. Clear any walking paths of anything that might make someone trip, such as rocks or tools. Regularly check to see if handrails are loose or broken. Make sure that both sides of any steps have handrails. Any raised decks and porches should have guardrails on the edges. Have any leaves, snow, or ice cleared regularly. Use sand or salt on walking paths during winter. Clean up any spills in your garage right away. This includes oil or grease spills. What can I do in the bathroom? Use night lights. Install grab bars by the toilet and in the tub and shower. Do not use towel bars as grab bars. Use  non-skid mats or decals in the tub or shower. If you need to sit down in the shower, use a plastic, non-slip stool. Keep the floor dry. Clean up any water that spills on the floor as soon as it happens. Remove soap buildup in the tub or shower regularly. Attach bath mats securely with double-sided non-slip rug tape. Do not have throw rugs and other things on the floor that can make you trip. What can I do in the bedroom? Use night lights. Make sure that you have a light by your bed that is easy to reach. Do not use any sheets or blankets that are too big for your bed. They should not hang down onto the floor. Have a firm chair that has side arms. You can use this for support while you get dressed. Do not have throw rugs and other things on the floor that can make you trip. What can I do in the kitchen? Clean up any spills right away. Avoid walking on wet floors. Keep items that you use a lot in easy-to-reach places. If you need to reach something above you, use a strong  step stool that has a grab bar. Keep electrical cords out of the way. Do not use floor polish or wax that makes floors slippery. If you must use wax, use non-skid floor wax. Do not have throw rugs and other things on the floor that can make you trip. What can I do with my stairs? Do not leave any items on the stairs. Make sure that there are handrails on both sides of the stairs and use them. Fix handrails that are broken or loose. Make sure that handrails are as long as the stairways. Check any carpeting to make sure that it is firmly attached to the stairs. Fix any carpet that is loose or worn. Avoid having throw rugs at the top or bottom of the stairs. If you do have throw rugs, attach them to the floor with carpet tape. Make sure that you have a light switch at the top of the stairs and the bottom of the stairs. If you do not have them, ask someone to add them for you. What else can I do to help prevent falls? Wear  shoes that: Do not have high heels. Have rubber bottoms. Are comfortable and fit you well. Are closed at the toe. Do not wear sandals. If you use a stepladder: Make sure that it is fully opened. Do not climb a closed stepladder. Make sure that both sides of the stepladder are locked into place. Ask someone to hold it for you, if possible. Clearly mark and make sure that you can see: Any grab bars or handrails. First and last steps. Where the edge of each step is. Use tools that help you move around (mobility aids) if they are needed. These include: Canes. Walkers. Scooters. Crutches. Turn on the lights when you go into a dark area. Replace any light bulbs as soon as they burn out. Set up your furniture so you have a clear path. Avoid moving your furniture around. If any of your floors are uneven, fix them. If there are any pets around you, be aware of where they are. Review your medicines with your doctor. Some medicines can make you feel dizzy. This can increase your chance of falling. Ask your doctor what other things that you can do to help prevent falls. This information is not intended to replace advice given to you by your health care provider. Make sure you discuss any questions you have with your health care provider. Document Released: 02/26/2009 Document Revised: 10/08/2015 Document Reviewed: 06/06/2014 Elsevier Interactive Patient Education  2017 [employer].

## 2022-12-01 NOTE — Progress Notes (Signed)
Subjective:   Donald Moreno is a 82 y.o. male who presents for Medicare Annual/Subsequent preventive examination.  Visit Complete: Virtual  I connected with  Donald Moreno on 12/02/22 by a audio enabled telemedicine application and verified that I am speaking with the correct person using two identifiers.  Patient Location: Home  Provider Location: Home Office  I discussed the limitations of evaluation and management by telemedicine. The patient expressed understanding and agreed to proceed.  Review of Systems     Cardiac Risk Factors include: advanced age (>33men, >20 women);smoking/ tobacco exposure;hypertension;male gender     Objective:    Today's Vitals   12/02/22 0944  Weight: 161 lb (73 kg)  Height: 5\' 10"  (1.778 m)   Body mass index is 23.1 kg/m.     12/02/2022   10:13 AM 10/26/2022    7:54 AM 10/25/2022   11:11 PM 10/11/2022    8:23 AM 09/08/2022    8:30 AM 08/25/2022    9:20 AM 08/11/2022   10:49 AM  Advanced Directives  Does Patient Have a Medical Advance Directive? Yes Yes No No No No No  Type of Estate agent of [place];Living will Healthcare Power of Attorney       Does patient want to make changes to medical advance directive? No - Patient declined Yes (Inpatient - patient defers changing a medical advance directive and declines information at this time)       Copy of Healthcare Power of Attorney in Chart? Yes - validated most recent copy scanned in chart (See row information)        Would patient like information on creating a medical advance directive? No - Patient declined   No - Patient declined No - Patient declined No - Patient declined No - Patient declined    Current Medications (verified) Outpatient Encounter Medications as of 12/02/2022  Medication Sig   acetaminophen (TYLENOL) 500 MG tablet Take 1 tablet (500 mg total) by mouth every 6 (six) hours as needed for mild pain (or Fever >/= 101).   albuterol (PROAIR HFA) 108 (90  Base) MCG/ACT inhaler Inhale 2 puffs into the lungs every 4 (four) hours as needed for wheezing.   allopurinol (ZYLOPRIM) 100 MG tablet TAKE 1/2 TABLET BY MOUTH EVERY DAY (Patient taking differently: Take 50 mg by mouth daily.)   amLODipine (NORVASC) 5 MG tablet Take 1 tablet (5 mg total) by mouth daily.   bisoprolol (ZEBETA) 5 MG tablet Take 0.5 tablets (2.5 mg total) by mouth daily.   Cholecalciferol (VITAMIN D-3) 5000 units TABS Take 1,000 Units by mouth daily.   furosemide (LASIX) 20 MG tablet TAKE 1 TABLET BY MOUTH EVERY DAY   lactulose (CHRONULAC) 10 GM/15ML solution TAKE 15 MLS (10 G TOTAL) BY MOUTH DAILY.   montelukast (SINGULAIR) 10 MG tablet TAKE 1 TABLET BY MOUTH EVERYDAY AT BEDTIME   Multiple Vitamin (MULTIVITAMIN WITH MINERALS) TABS tablet Take 1 tablet by mouth daily.   polyethylene glycol (MIRALAX / GLYCOLAX) 17 g packet Take 17 g by mouth daily.   spironolactone (ALDACTONE) 25 MG tablet TAKE 1 TABLET (25 MG TOTAL) BY MOUTH DAILY.   traMADol (ULTRAM) 50 MG tablet TAKE 1 TABLET BY MOUTH EVERY DAY AT BEDTIME AS NEEDED   WIXELA INHUB 500-50 MCG/ACT AEPB TAKE 1 PUFF BY MOUTH TWICE A DAY (Patient taking differently: Inhale 1 puff into the lungs in the morning and at bedtime.)   No facility-administered encounter medications on file as of 12/02/2022.    Allergies (  verified) Patient has no known allergies.   History: Past Medical History:  Diagnosis Date   Acute congestive heart failure (HCC)    Arthritis    "all over"   Asthma    Blood in stool 04/11/2017   Chronic kidney disease    "I see a kidney dr @ [facility]" (10/11/2016)   Chronic lower back pain    "turned a truck over a long time ago" (10/11/2016)   Dysrhythmia    bradycardia with occassional junctional rhythm   ETOH abuse    Gout    Hypertension    Inguinal hernia 10/28/2014   S/P inguinal hernia repair with 4L as ascites removed 03/30/17   Liver cirrhosis (HCC) 2015   Macrocytosis    Pneumonia    "long  long time ago" (10/11/2016)   Spontaneous bacterial peritonitis (HCC) 07/01/2015   Past Surgical History:  Procedure Laterality Date   INGUINAL HERNIA REPAIR Bilateral 03/30/2017   Procedure: OPEN BILATERAL INGUINAL HERNIA REPAIR WITH MESH;  Surgeon: Kinsinger, De Blanch, MD;  Location: [facility];  Service: General;  Laterality: Bilateral;  GENERAL COMBINED WITH REGIONAL FOR POST OP PAIN    INSERTION OF MESH N/A 10/16/2016   Procedure: INSERTION OF MESH;  Surgeon: Kinsinger, De Blanch, MD;  Location: [facility];  Service: General;  Laterality: N/A;   INSERTION OF MESH Bilateral 03/30/2017   Procedure: INSERTION OF MESH;  Surgeon: Sheliah Hatch De Blanch, MD;  Location: [facility];  Service: General;  Laterality: Bilateral;  GENERAL COMBINED WITH REGIONAL FOR POST OP PAIN    IR PARACENTESIS  08/19/2016   IR PARACENTESIS  08/26/2016   IR PARACENTESIS  09/02/2016   IR PARACENTESIS  09/09/2016   IR PARACENTESIS  09/16/2016   IR PARACENTESIS  10/03/2016   IR PARACENTESIS  10/11/2016   IR PARACENTESIS  10/26/2022   IR RADIOLOGIST EVAL & MGMT  09/29/2016   IR RADIOLOGIST EVAL & MGMT  12/21/2016   IR RADIOLOGIST EVAL & MGMT  01/23/2018   IR RADIOLOGIST EVAL & MGMT  02/19/2019   IR RADIOLOGIST EVAL & MGMT  03/30/2020   IR RADIOLOGIST EVAL & MGMT  03/17/2021   IR TIPS  10/11/2016   RADIOLOGY WITH ANESTHESIA N/A 10/11/2016   Procedure: TIPS;  Surgeon: Oley Balm, MD;  Location: [facility];  Service: Radiology;  Laterality: N/A;   UMBILICAL HERNIA REPAIR N/A 10/16/2016   Procedure: HERNIA REPAIR UMBILICAL ADULT;  Surgeon: Kinsinger, De Blanch, MD;  Location: [facility] OR;  Service: General;  Laterality: N/A;   Family History  Problem Relation Age of Onset   Other Mother        died when pt was only 6 - unknown cause.   Other Father        deceased.   Asthma Sister    Heart disease Sister        multiple stents.   Asthma Brother    Hypertension Brother    Asthma Son    Asthma Brother    Hypertension Brother    Social  History   Socioeconomic History   Marital status: Divorced    Spouse name: Not on file   Number of children: 5   Years of education: 10   Highest education level: Not on file  Occupational History   Occupation: Retired- truck Hospital doctor  Tobacco Use   Smoking status: Never    Passive exposure: Current   Smokeless tobacco: Current    Types: Snuff, Chew   Tobacco comments:    Started  chewing tobacco at 82 years old     Currently dip snuff only at this time - 11/09/22  Vaping Use   Vaping status: Never Used  Substance and Sexual Activity   Alcohol use: No    Comment: quit drinking "over 2 yr ago" (10/11/2016)   Drug use: No   Sexual activity: Not Currently  Other Topics Concern   Not on file  Social History Narrative   Lives with nephew and niece in [place] home.     Tobacco: snuff all day (can last two days. Never smoked.       Hobbies: cooking, sleeping, going to store   Pets:  Dog, Bean   Social Determinants of Health   Financial Resource Strain: Low Risk  (12/02/2022)   Overall Financial Resource Strain (CARDIA)    Difficulty of Paying Living Expenses: Not hard at all  Food Insecurity: No Food Insecurity (12/02/2022)   Hunger Vital Sign    Worried About Running Out of Food in the Last Year: Never true    Ran Out of Food in the Last Year: Never true  Transportation Needs: No Transportation Needs (12/02/2022)   PRAPARE - Administrator, Civil Service (Medical): No    Lack of Transportation (Non-Medical): No  Physical Activity: Sufficiently Active (12/02/2022)   Exercise Vital Sign    Days of Exercise per Week: 5 days    Minutes of Exercise per Session: 30 min  Stress: No Stress Concern Present (12/02/2022)   [employer] of Occupational Health - Occupational Stress Questionnaire    Feeling of Stress : Not at all  Social Connections: Moderately Isolated (12/02/2022)   Social Connection and Isolation Panel [NHANES]    Frequency of Communication with Friends  and Family: More than three times a week    Frequency of Social Gatherings with Friends and Family: Three times a week    Attends Religious Services: 1 to 4 times per year    Active Member of Clubs or Organizations: No    Attends Banker Meetings: Never    Marital Status: Divorced    Tobacco Counseling Ready to quit: Not Answered Counseling given: Not Answered Tobacco comments: Started chewing tobacco at 82 years old  Currently dip snuff only at this time - 11/09/22   Clinical Intake:  Pre-visit preparation completed: Yes  Pain : No/denies pain     Diabetes: No  How often do you need to have someone help you when you read instructions, pamphlets, or other written materials from your doctor or pharmacy?: 4 - Often What is the last grade level you completed in school?: does not read well  Interpreter Needed?: No  Information entered by :: Kandis Fantasia LPN   Activities of Daily Living    12/02/2022   10:09 AM 10/26/2022    7:54 AM  In your present state of health, do you have any difficulty performing the following activities:  Hearing? 0 0  Vision? 0 0  Difficulty concentrating or making decisions? 0 0  Walking or climbing stairs? 1 1  Dressing or bathing? 0 0  Doing errands, shopping? 1 0  Preparing Food and eating ? N   Using the Toilet? N   In the past six months, have you accidently leaked urine? N   Do you have problems with loss of bowel control? N   Managing your Medications? N   Managing your Finances? N   Housekeeping or managing your Housekeeping? N     Patient  Care Team: Nelia Shi, MD as PCP - General (Family Medicine) Croitoru, Rachelle Hora, MD as PCP - Cardiology (Cardiology) Willis Modena, MD as Consulting Physician (Gastroenterology)  Indicate any recent Medical Services you may have received from other than [facility] providers in the past year (date may be approximate).     Assessment:   This is a routine wellness examination for  Daelyn.  Hearing/Vision screen Hearing Screening - Comments:: Hard of hearing; followed by audiology   Vision Screening - Comments:: Wears rx glasses - up to date with routine eye exams      Dietary issues and exercise activities discussed:     Goals Addressed             This Visit's Progress    Remain active         Depression Screen    12/02/2022   10:12 AM 11/07/2022    9:47 AM 10/31/2022    3:07 PM 10/11/2022    8:23 AM 09/08/2022    8:29 AM 08/25/2022    9:20 AM 08/11/2022   10:49 AM  PHQ 2/9 Scores  PHQ - 2 Score 0   0  0   PHQ- 9 Score 0   0  0   Exception Documentation  Patient refusal Patient refusal  Patient refusal  Patient refusal    Fall Risk    12/02/2022   10:13 AM 11/07/2022    9:47 AM 10/31/2022    3:07 PM 10/11/2022    8:24 AM 09/08/2022    8:30 AM  Fall Risk   Falls in the past year? 0 0 0 0 0  Number falls in past yr: 0 0 0 0 0  Injury with Fall? 0 0 0 0 0  Risk for fall due to : Impaired balance/gait;History of fall(s)   Impaired balance/gait;Impaired mobility   Follow up Falls prevention discussed;Education provided;Falls evaluation completed   Falls prevention discussed     MEDICARE RISK AT HOME:  Medicare Risk at Home - 12/02/22 1014     Any stairs in or around the home? No    If so, are there any without handrails? No    Home free of loose throw rugs in walkways, pet beds, electrical cords, etc? Yes    Adequate lighting in your home to reduce risk of falls? Yes    Life alert? No    Use of a cane, walker or w/c? Yes    Grab bars in the bathroom? Yes    Shower chair or bench in shower? Yes    Elevated toilet seat or a handicapped toilet? No             TIMED UP AND GO:  Was the test performed?  No    Cognitive Function:    07/31/2012   10:00 AM 06/28/2011   10:00 AM  MMSE - Mini Mental State Exam  Orientation to time 5 5  Orientation to Place 5 5  Registration 3 3  Attention/ Calculation 0 0  Recall 2 2  Language- name  2 objects 2 2  Language- repeat 1 1  Language- follow 3 step command 3 3  Language- read & follow direction 0 1  Write a sentence 0 0  Copy design 1 1  Total score 22 23        12/02/2022   10:14 AM  6CIT Screen  What Year? 0 points  What month? 0 points  What time? 0 points  Count back from 20 2  points  Months in reverse 2 points  Repeat phrase 0 points  Total Score 4 points    Immunizations Immunization History  Administered Date(s) Administered   COVID-19, mRNA, vaccine(Comirnaty)12 years and older 08/25/2022   Fluad Quad(high Dose 65+) 02/25/2020, 02/03/2021, 01/31/2022   Influenza Split 02/15/2011, 03/07/2012   Influenza Whole 04/30/2007, 02/28/2008, 02/18/2009, 02/16/2010   Influenza, High Dose Seasonal PF 02/05/2019, 02/27/2019   Influenza,inj,Quad PF,6+ Mos 01/30/2013, 01/27/2014, 03/04/2015, 03/09/2016, 01/13/2017, 01/29/2018   PFIZER(Purple Top)SARS-COV-2 Vaccination 06/30/2019, 09/23/2019, 03/25/2020   Pfizer Covid-19 Vaccine Bivalent Booster 57yrs & up 06/09/2021   Pneumococcal Conjugate-13 01/30/2013   Pneumococcal Polysaccharide-23 02/13/1998, 04/25/2008   Td 05/17/1999, 05/31/2010    TDAP status: Due, Education has been provided regarding the importance of this vaccine. Advised may receive this vaccine at local pharmacy or Health Dept. Aware to provide a copy of the vaccination record if obtained from local pharmacy or Health Dept. Verbalized acceptance and understanding.  Pneumococcal vaccine status: Up to date  Covid-19 vaccine status: Information provided on how to obtain vaccines.   Qualifies for Shingles Vaccine? Yes   Zostavax completed No   Shingrix Completed?: No.    Education has been provided regarding the importance of this vaccine. Patient has been advised to call insurance company to determine out of pocket expense if they have not yet received this vaccine. Advised may also receive vaccine at local pharmacy or Health Dept. Verbalized  acceptance and understanding.  Screening Tests Health Maintenance  Topic Date Due   Zoster Vaccines- Shingrix (1 of 2) Never done   DTaP/Tdap/Td (3 - Tdap) 05/31/2020   COVID-19 Vaccine (6 - 2023-24 season) 10/20/2022   INFLUENZA VACCINE  12/15/2022   Medicare Annual Wellness (AWV)  12/02/2023   Pneumonia Vaccine 77+ Years old  Completed   HPV VACCINES  Aged Out   Hepatitis C Screening  Discontinued    Health Maintenance  Health Maintenance Due  Topic Date Due   Zoster Vaccines- Shingrix (1 of 2) Never done   DTaP/Tdap/Td (3 - Tdap) 05/31/2020   COVID-19 Vaccine (6 - 2023-24 season) 10/20/2022    Colorectal cancer screening: No longer required.   Lung Cancer Screening: (Low Dose CT Chest recommended if Age 63-80 years, 20 pack-year currently smoking OR have quit w/in 15years.) does not qualify.   Lung Cancer Screening Referral: n/a  Additional Screening:  Hepatitis C Screening: does not qualify;   Vision Screening: Recommended annual ophthalmology exams for early detection of glaucoma and other disorders of the eye. Is the patient up to date with their annual eye exam?  Yes  Who is the provider or what is the name of the office in which the patient attends annual eye exams? Unable to provide name  If pt is not established with a provider, would they like to be referred to a provider to establish care? No .   Dental Screening: Recommended annual dental exams for proper oral hygiene  Community Resource Referral / Chronic Care Management: CRR required this visit?  No   CCM required this visit?  No     Plan:     I have personally reviewed and noted the following in the patient's chart:   Medical and social history Use of alcohol, tobacco or illicit drugs  Current medications and supplements including opioid prescriptions. Patient is not currently taking opioid prescriptions. Functional ability and status Nutritional status Physical activity Advanced  directives List of other physicians Hospitalizations, surgeries, and ER visits in previous 12 months Vitals Screenings  to include cognitive, depression, and falls Referrals and appointments  In addition, I have reviewed and discussed with patient certain preventive protocols, quality metrics, and best practice recommendations. A written personalized care plan for preventive services as well as general preventive health recommendations were provided to patient.     Kandis Fantasia [place], [place]   1/61/0960   After Visit Summary: (Mail) Due to this being a telephonic visit, the after visit summary with patients personalized plan was offered to patient via mail   Nurse Notes: No concerns

## 2022-12-02 ENCOUNTER — Ambulatory Visit (INDEPENDENT_AMBULATORY_CARE_PROVIDER_SITE_OTHER): Payer: 59

## 2022-12-02 VITALS — Ht 70.0 in | Wt 161.0 lb

## 2022-12-02 DIAGNOSIS — Z0000 Encounter for general adult medical examination without abnormal findings: Secondary | ICD-10-CM | POA: Diagnosis not present

## 2022-12-05 ENCOUNTER — Other Ambulatory Visit: Payer: Self-pay

## 2022-12-05 ENCOUNTER — Encounter (HOSPITAL_COMMUNITY): Payer: Self-pay

## 2022-12-05 ENCOUNTER — Observation Stay (HOSPITAL_COMMUNITY): Payer: 59

## 2022-12-05 ENCOUNTER — Emergency Department (HOSPITAL_COMMUNITY): Payer: 59

## 2022-12-05 ENCOUNTER — Inpatient Hospital Stay (HOSPITAL_COMMUNITY)
Admission: EM | Admit: 2022-12-05 | Discharge: 2022-12-15 | DRG: 438 | Disposition: E | Payer: 59 | Attending: Family Medicine | Admitting: Family Medicine

## 2022-12-05 DIAGNOSIS — E785 Hyperlipidemia, unspecified: Secondary | ICD-10-CM | POA: Diagnosis present

## 2022-12-05 DIAGNOSIS — N184 Chronic kidney disease, stage 4 (severe): Secondary | ICD-10-CM | POA: Diagnosis present

## 2022-12-05 DIAGNOSIS — K3189 Other diseases of stomach and duodenum: Secondary | ICD-10-CM | POA: Diagnosis present

## 2022-12-05 DIAGNOSIS — D539 Nutritional anemia, unspecified: Secondary | ICD-10-CM | POA: Diagnosis present

## 2022-12-05 DIAGNOSIS — I4892 Unspecified atrial flutter: Secondary | ICD-10-CM | POA: Diagnosis present

## 2022-12-05 DIAGNOSIS — I10 Essential (primary) hypertension: Secondary | ICD-10-CM | POA: Diagnosis present

## 2022-12-05 DIAGNOSIS — Z515 Encounter for palliative care: Secondary | ICD-10-CM

## 2022-12-05 DIAGNOSIS — Z825 Family history of asthma and other chronic lower respiratory diseases: Secondary | ICD-10-CM

## 2022-12-05 DIAGNOSIS — R1013 Epigastric pain: Secondary | ICD-10-CM

## 2022-12-05 DIAGNOSIS — K859 Acute pancreatitis without necrosis or infection, unspecified: Secondary | ICD-10-CM | POA: Diagnosis present

## 2022-12-05 DIAGNOSIS — Z720 Tobacco use: Secondary | ICD-10-CM

## 2022-12-05 DIAGNOSIS — R0603 Acute respiratory distress: Secondary | ICD-10-CM | POA: Diagnosis present

## 2022-12-05 DIAGNOSIS — I130 Hypertensive heart and chronic kidney disease with heart failure and stage 1 through stage 4 chronic kidney disease, or unspecified chronic kidney disease: Secondary | ICD-10-CM | POA: Diagnosis present

## 2022-12-05 DIAGNOSIS — D631 Anemia in chronic kidney disease: Secondary | ICD-10-CM | POA: Diagnosis present

## 2022-12-05 DIAGNOSIS — R109 Unspecified abdominal pain: Secondary | ICD-10-CM | POA: Diagnosis present

## 2022-12-05 DIAGNOSIS — R54 Age-related physical debility: Secondary | ICD-10-CM | POA: Diagnosis present

## 2022-12-05 DIAGNOSIS — K7460 Unspecified cirrhosis of liver: Secondary | ICD-10-CM | POA: Diagnosis present

## 2022-12-05 DIAGNOSIS — R001 Bradycardia, unspecified: Secondary | ICD-10-CM | POA: Diagnosis present

## 2022-12-05 DIAGNOSIS — J45909 Unspecified asthma, uncomplicated: Secondary | ICD-10-CM | POA: Diagnosis present

## 2022-12-05 DIAGNOSIS — E875 Hyperkalemia: Secondary | ICD-10-CM | POA: Diagnosis present

## 2022-12-05 DIAGNOSIS — K7031 Alcoholic cirrhosis of liver with ascites: Secondary | ICD-10-CM | POA: Diagnosis present

## 2022-12-05 DIAGNOSIS — I5032 Chronic diastolic (congestive) heart failure: Secondary | ICD-10-CM | POA: Diagnosis present

## 2022-12-05 DIAGNOSIS — R748 Abnormal levels of other serum enzymes: Secondary | ICD-10-CM | POA: Diagnosis present

## 2022-12-05 DIAGNOSIS — R1030 Lower abdominal pain, unspecified: Secondary | ICD-10-CM | POA: Diagnosis not present

## 2022-12-05 DIAGNOSIS — M109 Gout, unspecified: Secondary | ICD-10-CM | POA: Diagnosis present

## 2022-12-05 DIAGNOSIS — G9341 Metabolic encephalopathy: Secondary | ICD-10-CM | POA: Diagnosis present

## 2022-12-05 DIAGNOSIS — K652 Spontaneous bacterial peritonitis: Secondary | ICD-10-CM | POA: Diagnosis not present

## 2022-12-05 DIAGNOSIS — E872 Acidosis, unspecified: Secondary | ICD-10-CM | POA: Diagnosis present

## 2022-12-05 DIAGNOSIS — I959 Hypotension, unspecified: Secondary | ICD-10-CM | POA: Diagnosis present

## 2022-12-05 DIAGNOSIS — M7989 Other specified soft tissue disorders: Secondary | ICD-10-CM

## 2022-12-05 DIAGNOSIS — R6520 Severe sepsis without septic shock: Secondary | ICD-10-CM

## 2022-12-05 DIAGNOSIS — N170 Acute kidney failure with tubular necrosis: Secondary | ICD-10-CM | POA: Diagnosis present

## 2022-12-05 DIAGNOSIS — Z7189 Other specified counseling: Secondary | ICD-10-CM

## 2022-12-05 DIAGNOSIS — Z66 Do not resuscitate: Secondary | ICD-10-CM | POA: Diagnosis present

## 2022-12-05 DIAGNOSIS — I8510 Secondary esophageal varices without bleeding: Secondary | ICD-10-CM | POA: Diagnosis present

## 2022-12-05 DIAGNOSIS — R1084 Generalized abdominal pain: Principal | ICD-10-CM

## 2022-12-05 DIAGNOSIS — Z8249 Family history of ischemic heart disease and other diseases of the circulatory system: Secondary | ICD-10-CM

## 2022-12-05 DIAGNOSIS — F1011 Alcohol abuse, in remission: Secondary | ICD-10-CM | POA: Diagnosis present

## 2022-12-05 DIAGNOSIS — A419 Sepsis, unspecified organism: Secondary | ICD-10-CM

## 2022-12-05 DIAGNOSIS — Z79899 Other long term (current) drug therapy: Secondary | ICD-10-CM

## 2022-12-05 HISTORY — PX: IR PARACENTESIS: IMG2679

## 2022-12-05 LAB — GLUCOSE, PLEURAL OR PERITONEAL FLUID: Glucose, Fluid: 124 mg/dL

## 2022-12-05 LAB — CBC WITH DIFFERENTIAL/PLATELET
Abs Immature Granulocytes: 0.02 10*3/uL (ref 0.00–0.07)
Basophils Absolute: 0 10*3/uL (ref 0.0–0.1)
Basophils Relative: 1 %
Eosinophils Absolute: 0.5 10*3/uL (ref 0.0–0.5)
Eosinophils Relative: 6 %
HCT: 32.9 % — ABNORMAL LOW (ref 39.0–52.0)
Hemoglobin: 10.8 g/dL — ABNORMAL LOW (ref 13.0–17.0)
Immature Granulocytes: 0 %
Lymphocytes Relative: 26 %
Lymphs Abs: 1.9 10*3/uL (ref 0.7–4.0)
MCH: 33.4 pg (ref 26.0–34.0)
MCHC: 32.8 g/dL (ref 30.0–36.0)
MCV: 101.9 fL — ABNORMAL HIGH (ref 80.0–100.0)
Monocytes Absolute: 0.3 10*3/uL (ref 0.1–1.0)
Monocytes Relative: 4 %
Neutro Abs: 4.7 10*3/uL (ref 1.7–7.7)
Neutrophils Relative %: 63 %
Platelets: 152 10*3/uL (ref 150–400)
RBC: 3.23 MIL/uL — ABNORMAL LOW (ref 4.22–5.81)
RDW: 13.4 % (ref 11.5–15.5)
WBC: 7.5 10*3/uL (ref 4.0–10.5)
nRBC: 0 % (ref 0.0–0.2)

## 2022-12-05 LAB — PROTEIN, PLEURAL OR PERITONEAL FLUID

## 2022-12-05 LAB — LACTATE DEHYDROGENASE, PLEURAL OR PERITONEAL FLUID: LD, Fluid: 1302 U/L — ABNORMAL HIGH (ref 3–23)

## 2022-12-05 LAB — BODY FLUID CELL COUNT WITH DIFFERENTIAL
Other Cells, Fluid: UNDETERMINED %
Total Nucleated Cell Count, Fluid: 89 cu mm (ref 0–1000)

## 2022-12-05 LAB — COMPREHENSIVE METABOLIC PANEL
ALT: 66 U/L — ABNORMAL HIGH (ref 0–44)
AST: 65 U/L — ABNORMAL HIGH (ref 15–41)
Albumin: 3.1 g/dL — ABNORMAL LOW (ref 3.5–5.0)
Alkaline Phosphatase: 159 U/L — ABNORMAL HIGH (ref 38–126)
Anion gap: 9 (ref 5–15)
BUN: 50 mg/dL — ABNORMAL HIGH (ref 8–23)
CO2: 18 mmol/L — ABNORMAL LOW (ref 22–32)
Calcium: 7.4 mg/dL — ABNORMAL LOW (ref 8.9–10.3)
Chloride: 113 mmol/L — ABNORMAL HIGH (ref 98–111)
Creatinine, Ser: 2.27 mg/dL — ABNORMAL HIGH (ref 0.61–1.24)
GFR, Estimated: 28 mL/min — ABNORMAL LOW (ref >60)
Glucose, Bld: 117 mg/dL — ABNORMAL HIGH (ref 70–99)
Potassium: 5 mmol/L (ref 3.5–5.1)
Sodium: 140 mmol/L (ref 135–145)
Total Bilirubin: 1.4 mg/dL — ABNORMAL HIGH (ref 0.3–1.2)
Total Protein: 7.2 g/dL (ref 6.5–8.1)

## 2022-12-05 LAB — TROPONIN I (HIGH SENSITIVITY)
Troponin I (High Sensitivity): 14 ng/L (ref <18)
Troponin I (High Sensitivity): 15 ng/L (ref <18)

## 2022-12-05 LAB — PROTIME-INR
INR: 1.5 — ABNORMAL HIGH (ref 0.8–1.2)
Prothrombin Time: 18.6 seconds — ABNORMAL HIGH (ref 11.4–15.2)

## 2022-12-05 LAB — LACTIC ACID, PLASMA
Lactic Acid, Venous: 2 mmol/L (ref 0.5–1.9)
Lactic Acid, Venous: 2.3 mmol/L (ref 0.5–1.9)

## 2022-12-05 LAB — APTT: aPTT: 34 seconds (ref 24–36)

## 2022-12-05 LAB — LIPASE, BLOOD: Lipase: 287 U/L — ABNORMAL HIGH (ref 11–51)

## 2022-12-05 LAB — BODY FLUID CULTURE W GRAM STAIN

## 2022-12-05 LAB — ALBUMIN, PLEURAL OR PERITONEAL FLUID: Albumin, Fluid: 2.5 g/dL

## 2022-12-05 MED ORDER — MIDODRINE HCL 5 MG PO TABS
10.0000 mg | ORAL_TABLET | Freq: Two times a day (BID) | ORAL | Status: DC
  Filled 2022-12-05: qty 2

## 2022-12-05 MED ORDER — MONTELUKAST SODIUM 10 MG PO TABS: 10.0000 mg | ORAL_TABLET | Freq: Every day | ORAL | Status: DC

## 2022-12-05 MED ORDER — SODIUM CHLORIDE 0.9 % IV SOLN
INTRAVENOUS | Status: AC
  Administered 2022-12-05: 2 g via INTRAVENOUS
  Filled 2022-12-05: qty 20

## 2022-12-05 MED ORDER — LACTULOSE ENEMA
300.0000 mL | Freq: Once | ORAL | Status: DC
  Filled 2022-12-05: qty 300

## 2022-12-05 MED ORDER — SODIUM CHLORIDE 0.9 % IV SOLN: 2.0000 g | INTRAVENOUS | Status: DC

## 2022-12-05 MED ORDER — AMLODIPINE BESYLATE 5 MG PO TABS: 5.0000 mg | ORAL_TABLET | Freq: Every day | ORAL | Status: DC

## 2022-12-05 MED ORDER — BISOPROLOL FUMARATE 5 MG PO TABS: 2.5000 mg | ORAL_TABLET | Freq: Every day | ORAL | Status: DC

## 2022-12-05 MED ORDER — METRONIDAZOLE 500 MG/100ML IV SOLN
500.0000 mg | Freq: Two times a day (BID) | INTRAVENOUS | Status: DC
  Administered 2022-12-05 (×2): 500 mg via INTRAVENOUS
  Filled 2022-12-05 (×2): qty 100

## 2022-12-05 MED ORDER — ENOXAPARIN SODIUM 30 MG/0.3ML IJ SOSY
30.0000 mg | PREFILLED_SYRINGE | INTRAMUSCULAR | Status: DC
  Administered 2022-12-05: 30 mg via SUBCUTANEOUS
  Filled 2022-12-05: qty 0.3

## 2022-12-05 MED ORDER — LACTULOSE 10 GM/15ML PO SOLN: 10.0000 g | Freq: Every day | ORAL | Status: DC

## 2022-12-05 MED ORDER — HYDROMORPHONE HCL 1 MG/ML IJ SOLN
1.0000 mg | INTRAMUSCULAR | Status: DC | PRN
  Administered 2022-12-05 (×2): 1 mg via INTRAVENOUS
  Filled 2022-12-05 (×2): qty 1

## 2022-12-05 MED ORDER — HYDROMORPHONE HCL 1 MG/ML IJ SOLN
1.0000 mg | Freq: Once | INTRAMUSCULAR | Status: AC
  Administered 2022-12-05: 1 mg via INTRAVENOUS
  Filled 2022-12-05: qty 1

## 2022-12-05 MED ORDER — LACTATED RINGERS IV BOLUS
500.0000 mL | Freq: Once | INTRAVENOUS | Status: AC
  Administered 2022-12-05: 500 mL via INTRAVENOUS

## 2022-12-05 MED ORDER — SENNA 8.6 MG PO TABS
1.0000 | ORAL_TABLET | Freq: Two times a day (BID) | ORAL | Status: DC
  Filled 2022-12-05: qty 1

## 2022-12-05 MED ORDER — FUROSEMIDE 10 MG/ML IJ SOLN
40.0000 mg | Freq: Once | INTRAMUSCULAR | Status: AC
  Administered 2022-12-05: 40 mg via INTRAVENOUS
  Filled 2022-12-05: qty 4

## 2022-12-05 MED ORDER — OXYCODONE HCL 5 MG PO TABS: 5.0000 mg | ORAL_TABLET | ORAL | Status: DC | PRN

## 2022-12-05 MED ORDER — ALBUMIN HUMAN 25 % IV SOLN: 25.0000 g | Freq: Once | INTRAVENOUS | Status: DC

## 2022-12-05 MED ORDER — HYDROMORPHONE HCL 1 MG/ML IJ SOLN
1.0000 mg | Freq: Once | INTRAMUSCULAR | Status: AC
  Administered 2022-12-05: 1 mg via INTRAVENOUS

## 2022-12-05 MED ORDER — ALBUTEROL SULFATE (2.5 MG/3ML) 0.083% IN NEBU: 2.5000 mg | INHALATION_SOLUTION | Freq: Four times a day (QID) | RESPIRATORY_TRACT | Status: DC | PRN

## 2022-12-05 MED ORDER — MOMETASONE FURO-FORMOTEROL FUM 200-5 MCG/ACT IN AERO
2.0000 | INHALATION_SPRAY | Freq: Two times a day (BID) | RESPIRATORY_TRACT | Status: DC
  Filled 2022-12-05: qty 8.8

## 2022-12-05 MED ORDER — ALBUTEROL SULFATE HFA 108 (90 BASE) MCG/ACT IN AERS: 2.0000 | INHALATION_SPRAY | RESPIRATORY_TRACT | Status: DC | PRN

## 2022-12-05 MED ORDER — SODIUM CHLORIDE 0.9 % IV SOLN: 2.0000 g | Freq: Once | INTRAVENOUS | Status: AC

## 2022-12-05 MED ORDER — SODIUM CHLORIDE 0.9 % IV SOLN: 2.0000 g | Freq: Once | INTRAVENOUS | Status: DC

## 2022-12-05 MED ORDER — POLYETHYLENE GLYCOL 3350 17 G PO PACK
17.0000 g | PACK | Freq: Every day | ORAL | Status: DC
  Filled 2022-12-05: qty 1

## 2022-12-05 MED ORDER — HYDROMORPHONE HCL 1 MG/ML IJ SOLN
INTRAMUSCULAR | Status: AC
  Administered 2022-12-05: 1 mg via INTRAVENOUS
  Filled 2022-12-05: qty 1

## 2022-12-05 MED ORDER — HYDROMORPHONE HCL 1 MG/ML IJ SOLN
0.5000 mg | Freq: Once | INTRAMUSCULAR | Status: AC | PRN
  Administered 2022-12-05: 0.5 mg via INTRAVENOUS
  Filled 2022-12-05: qty 0.5

## 2022-12-05 MED ORDER — ALBUMIN HUMAN 25 % IV SOLN: 50.0000 g | Freq: Once | INTRAVENOUS | Status: DC

## 2022-12-05 MED ORDER — LIDOCAINE HCL 1 % IJ SOLN
INTRAMUSCULAR | Status: AC
  Filled 2022-12-05: qty 20

## 2022-12-05 MED ORDER — SPIRONOLACTONE 12.5 MG HALF TABLET: 25.0000 mg | ORAL_TABLET | Freq: Every day | ORAL | Status: DC

## 2022-12-05 NOTE — Procedures (Signed)
PROCEDURE SUMMARY:  Successful image-guided paracentesis from the right lower abdomen.  Yielded 1.1 liters of milky-yellow fluid.  No immediate complications.  EBL = trace. Patient tolerated well.   Specimen was sent for labs.  Please see imaging section of Epic for full dictation.   Kennieth Francois PA-C 11/23/2022 4:14 PM

## 2022-12-05 NOTE — Progress Notes (Signed)
I tried to call Alyson Ingles, niece, with no answer.  I was able to get in contact with Amedeo Plenty (nephew whom the patient lives with) who provided the following collateral information:   Mr. Everitt Amber says Mr. Norgaard was in his usual state of health on Sunday, but then this morning at 0400, the patient asked him to call the ambulance because he was hurting to the point where he could not stand up. It was a sudden onset pain. He says this was similar to what happened to him prior to his admission last month, but a bit worse. He confirms that his code status is DNR/DNI.  Says he has not had any vomiting or falls at home.  HCPOA is nephew Alen Blew, phone # [phone]- tried to call with no answer.  Darral Dash, DO PGY-3 [facility]

## 2022-12-05 NOTE — Assessment & Plan Note (Signed)
Unfortunately progressed from stage III to stage IV CKD within the past month.  Reassuringly, creatinine is not exquisitely elevated, and is generally near his baseline of 2. -Avoid nephrotoxic medications -Nephrology consulted will follow along this mission.  Appreciate their assistance -Monitor for hyperkalemia given his history.  Potassium 5.0 at this time.

## 2022-12-05 NOTE — Assessment & Plan Note (Signed)
Patient is DNR/DNI.  Advanced directives reviewed.

## 2022-12-05 NOTE — Assessment & Plan Note (Signed)
Known liver cirrhosis, AST and ALT in 60s with Tbili 1.4. Abdomen with large volume ascites on CT abdomen/pelvis, and may require therapeutic paracentesis while admitted -Monitor ascites -Continue home lactulose 10 g daily -Liver Doppler to ensure TIPS patency

## 2022-12-05 NOTE — ED Provider Notes (Incomplete)
[place] EMERGENCY DEPARTMENT AT [facility] Provider Note   CSN: [id number] Arrival date & time: 11/22/2022  0405     History {Add pertinent medical, surgical, social history, OB history to HPI:1} Chief Complaint  Patient presents with  . Abdominal Pain    Donald Moreno is a 82 y.o. male.   Abdominal Pain      Home Medications Prior to Admission medications   Medication Sig Start Date End Date Taking? Authorizing Provider  acetaminophen (TYLENOL) 500 MG tablet Take 1 tablet (500 mg total) by mouth every 6 (six) hours as needed for mild pain (or Fever >/= 101). 10/29/22   [name], DO  albuterol (PROAIR HFA) 108 (90 Base) MCG/ACT inhaler Inhale 2 puffs into the lungs every 4 (four) hours as needed for wheezing. 05/24/18   Shirley, [place], DO  allopurinol (ZYLOPRIM) 100 MG tablet TAKE 1/2 TABLET BY MOUTH EVERY DAY Patient taking differently: Take 50 mg by mouth daily. 06/16/22   Lilland, Alana, DO  amLODipine (NORVASC) 5 MG tablet Take 1 tablet (5 mg total) by mouth daily. 05/12/22   Croitoru, Mihai, MD  bisoprolol (ZEBETA) 5 MG tablet Take 0.5 tablets (2.5 mg total) by mouth daily. 10/30/22   [name], DO  Cholecalciferol (VITAMIN D-3) 5000 units TABS Take 1,000 Units by mouth daily.    [provider]  furosemide (LASIX) 20 MG tablet TAKE 1 TABLET BY MOUTH EVERY DAY 11/21/22   Shitarev, Dimitry, MD  lactulose (CHRONULAC) 10 GM/15ML solution TAKE 15 MLS (10 G TOTAL) BY MOUTH DAILY. 11/07/22   Lilland, Alana, DO  montelukast (SINGULAIR) 10 MG tablet TAKE 1 TABLET BY MOUTH EVERYDAY AT BEDTIME 07/15/22   Lilland, Alana, DO  Multiple Vitamin (MULTIVITAMIN WITH MINERALS) TABS tablet Take 1 tablet by mouth daily. 10/29/22   [name], DO  polyethylene glycol (MIRALAX / GLYCOLAX) 17 g packet Take 17 g by mouth daily. 10/29/22   [name], DO  spironolactone (ALDACTONE) 25 MG tablet TAKE 1 TABLET (25 MG TOTAL) BY MOUTH DAILY. 11/21/22   Shitarev, Dimitry, MD   traMADol (ULTRAM) 50 MG tablet TAKE 1 TABLET BY MOUTH EVERY DAY AT BEDTIME AS NEEDED 10/11/22   Lilland, Alana, DO  WIXELA INHUB 500-50 MCG/ACT AEPB TAKE 1 PUFF BY MOUTH TWICE A DAY Patient taking differently: Inhale 1 puff into the lungs in the morning and at bedtime. 08/17/22   Littie Deeds, MD      Allergies    Patient has no known allergies.    Review of Systems   Review of Systems  Gastrointestinal:  Positive for abdominal pain.    Physical Exam Updated Vital Signs BP (!) 154/77 (BP Location: Right Arm)   Pulse (!) 56   Temp 98.7 F (37.1 C) (Oral)   Resp (!) 21   Ht 5\' 10"  (1.778 m)   Wt 74.8 kg   SpO2 (!) 52%   BMI 23.68 kg/m  Physical Exam  ED Results / Procedures / Treatments   Labs (all labs ordered are listed, but only abnormal results are displayed) Labs Reviewed  CBC WITH DIFFERENTIAL/PLATELET - Abnormal; Notable for the following components:      Result Value   RBC 3.23 (*)    Hemoglobin 10.8 (*)    HCT 32.9 (*)    MCV 101.9 (*)    All other components within normal limits  COMPREHENSIVE METABOLIC PANEL  LIPASE, BLOOD  URINALYSIS, ROUTINE W REFLEX MICROSCOPIC    EKG None  Radiology No results found.  Procedures Procedures  {Document cardiac monitor, telemetry assessment procedure when appropriate:1}  Medications Ordered in ED Medications  HYDROmorphone (DILAUDID) injection 1 mg (has no administration in time range)  lactated ringers bolus 500 mL (has no administration in time range)    ED Course/ Medical Decision Making/ A&P   {   Click here for ABCD2, HEART and other calculatorsREFRESH Note before signing :1}                          Medical Decision Making Amount and/or Complexity of Data Reviewed Labs: ordered. Radiology: ordered.  Risk Prescription drug management.   ***  {Document critical care time when appropriate:1} {Document review of labs and clinical decision tools ie heart score, Chads2Vasc2 etc:1}  {Document your  independent review of radiology images, and any outside records:1} {Document your discussion with family members, caretakers, and with consultants:1} {Document social determinants of health affecting pt's care:1} {Document your decision making why or why not admission, treatments were needed:1} Final Clinical Impression(s) / ED Diagnoses Final diagnoses:  None    Rx / DC Orders ED Discharge Orders     None

## 2022-12-05 NOTE — Progress Notes (Signed)
Went to bedside with Dr. Fatima Blank to speak to Donald Moreno who is patient's HCPOA. I discussed labs and imaging that were obtained throughout the day and explained the diagnostic paracentesis that was obtained earlier today to help rule out intra-abdominal infection.   We discussed Donald Moreno's poor prognosis and likelihood of decompensating over the night. [name] understands that this is most likely the end of Donald Moreno life. He reports that his uncle never wanted to be in a nursing home or be hooked up to machines to keep him living. He acknowledges that he has outlived his expected prognosis given the severity of his disease. He is open to discussing hospice care with Palliative Medicine tomorrow.   He would like for [place] to continue treating what we can, understanding there are limitations when it comes to supporting his blood pressure. He understands that more fluid could be very harmful to his uncle and that we are unable to give him medications by mouth due to his AMS. I discussed that Donald Moreno is not a good candidate for the ICU due to his medical frailty and wishes to be DNR/DNI. [name] agrees with Donald Moreno decision to remain DNR and avoid ICU admission understanding that his blood pressures will most likely continue to fall.   I discussed the risks and benefits of continuing pain medication as this can lower respiratory drive and blood pressure. [name] would like for [place] to continue to keep him comfortable with IV dilaudid overnight due to the amount of severe pain he was in earlier today.   He is not ready for the patient to transition to full comfort care as he would like for him to continue receiving IV antibiotics, but is okay with things naturally progressing - understanding that his uncle's prognosis is most likely hours-days.    Plan Overnight:  - continue minimally invasive medical intervention including IV antibiotics  - Pain control with 1 mg Dilaudid every 3 hours PRN - Will  place consult to Palliative Care for hospice  - Update family throughout the night on patient's status   [name], DO [facility], PGY-2 2022-12-28 6:14 PM

## 2022-12-05 NOTE — Assessment & Plan Note (Signed)
Lipase 287.  While he could have acute pancreatitis, especially given his exquisite pain on exam, it is more likely secondary to his ascites. Also, chronic liver disease may be contributing to elevation in lipase. Reassuringly, CT abdomen/pelvis did not show any acute changes of the pancreas at this time. -Lipase in the a.m. -Monitor, rescan if concern for worsening abdominal pain despite paracentesis

## 2022-12-05 NOTE — Progress Notes (Signed)
PT Cancellation Note  Patient Details Name: Jereld Presti MRN: [id number] DOB: 12/15/1940   Cancelled Treatment:    Reason Eval/Treat Not Completed: Pain limiting ability to participate. Pt moaning and crying out in pain upon PT arrival. Pt does not appear in a state were he is able to effectively participate in PT evaluation due to pain. PT will follow up tomorrow after further time for pain control.   Arlyss Gandy 2022-12-24, 12:40 PM

## 2022-12-05 NOTE — Progress Notes (Signed)
FMTS Brief Progress Note  S: Went to assess patient at bedside with Dr. Georg Ruddle and night RN.  Patient does not respond to verbal commands.  Family surrounding him currently with lots of support.  Asked if any questions currently only question was about his ascitic fluid.  Discussed that his fluid did not show a lot white blood cell counts but we currently still have him on antibiotics.   O: BP (!) 81/45 (BP Location: Right Arm)   Pulse 73   Temp 97.9 F (36.6 C) (Oral)   Resp 20   Ht 5\' 10"  (1.778 m)   Wt 74.8 kg   SpO2 96%   BMI 23.68 kg/m    General: Critically ill-appearing, GCS 5-does not respond to verbal cues or commands.  Does have some response to pain when palpating abdomen Head: Normocephalic atraumatic, dry mucous membranes, mouth agape CV: Regular rate  Respiratory: increased work of breathing with rhonchorous breath sounds throughout, chest rising symmetrically Abdomen: Tense and distended, tender to palpation does have some flexion with pain   A/P: Abdominal pain concerning for SBP  acute encephalopathy Preliminary body fluid culture with no WBC or organisms seen.  LDH of fluid very high at 1302.  Poor prognosis.  Family aware of this. -Continuing minimally invasive medical interventions-CTX/flagyl -Dilaudid 1 mg q3h prn -Avoiding fluids/pressors for BP management -Palliative to see tomorrow   Levin Erp, MD 12/08/2022, 8:17 PM PGY-3, [facility] Night Resident  Please page [phone] with questions.

## 2022-12-05 NOTE — Assessment & Plan Note (Signed)
Concerning for peritonitis given exquisite pain on palpation with rigidity. However, reassuringly afberile without concern for sepsis and CT abdomen/pelvis without acute changes from 1 month ago. Reassuringly, right upper quadrant ultrasound negative for choledocholithiasis or other acute gallbladder pathology. -Liver Doppler to ensure TIPS patency - IR diagnostic paracentesis with cell count, culture, gram stain and no fluid removal at this time - Pain control with Oxycodone 5 mg q4h and Dilaudid 0.5 mg q2h for breakthrough pain -Strict intake/output -N.p.o. for now pending IR paracentesis -Consider GI consult

## 2022-12-05 NOTE — Assessment & Plan Note (Signed)
Hemoglobin at baseline, 10.8 on admission.  MCV 101.9, fitting with known macrocytic cause. In the past, ferritin, iron, B12, folate all within normal limits.  Likely underlying CKD contributing with anemia of chronic disease -Monitor with morning labs, CBC

## 2022-12-05 NOTE — Assessment & Plan Note (Addendum)
S/p TIPS.  IR consulted for paracentesis with 5 L fluid removal restriction and postprocedure albumin. Also with type II hepatorenal syndrome given patient reports taking daily Lasix, concerning for diuretic resistant ascites. Reassuringly, right upper quadrant ultrasound negative for choledocholithiasis or other acute gallbladder pathology. -Liver Doppler to ensure TIPS patency -Lasix 40 mg x 1 -Strict intake/output -N.p.o. for now pending IR paracentesis -Consider GI consult

## 2022-12-05 NOTE — Consult Note (Addendum)
NAME:  Donald Moreno, MRN:  [id number], DOB:  31-Jan-1941, LOS: 0 ADMISSION DATE:  Dec 22, 2022, CONSULTATION DATE:  2022-12-22  REFERRING MD:  Annamary Rummage, CHIEF COMPLAINT:  hypotension   History of Present Illness:  82 year old man with cirrhosis and HFpEF admitted with acute onset abdominal pain.  History is limited due to his discomfort and waxing and waning mental status and poor speech.  CT abdomen/pelvis was negative. Right upper quadrant ultrasound showed ascites but no evidence of cholecystitis or CBD dilatation.  PCCM consulted for hypotension. He was given Dilaudid for pain, mental status has fluctuated.  Seen by renal who advised midodrine. Paracentesis is pending He was given fluid bolus but then felt to have wet lungs and given Lasix Ultrasound was ordered for right lower extremity swelling Blood pressure is charted as 97/60 and 105/59  Pertinent  Medical History  HFpEF- severe PH, RVSP 76 10/2022  HTN, HLD,  alcoholic liver cirrhosis with ascites, portal gastropathy requiring multiple paracenteses with SBP in the past,  CKD stage IV,  a flutter, and asthma   Significant Hospital Events: Including procedures, antibiotic start and stop dates in addition to other pertinent events     Interim History / Subjective:  Complains of pain. He winces on abdominal exam and then drifts back to sleep  Objective   Blood pressure (!) 105/59, pulse (!) 52, temperature (!) 97.5 F (36.4 C), temperature source Oral, resp. rate 20, height 5\' 10"  (1.778 m), weight 74.8 kg, SpO2 98%.        Intake/Output Summary (Last 24 hours) at 22-Dec-2022 1459 Last data filed at 12-22-2022 0654 Gross per 24 hour  Intake 500 ml  Output --  Net 500 ml   Filed Weights   12/22/22 0420  Weight: 74.8 kg    Examination: General: Elderly man, lying supine, no distress.  HENT: Mild pallor, no icterus, no JVD Lungs: Upper airway secretions, no rhonchi, no crackles, no accessory muscle  use Cardiovascular: S1 normal, loud P2, no murmur Abdomen: Soft, mild distended, diffusely tender, no guarding Extremities: No deformity, right leg more swollen than left Neuro: Somnolent but easily arousable, follows one-step commands and drifts back to sleep   Labs show hyperkalemia 5.0, BUN/creatinine 50/2.3, lipase 287, no leukocytosis, hemoglobin 10.8, mild thrombocytopenia  Resolved Hospital Problem list     Assessment & Plan:  Acute abdominal pain, differential includes acute pancreatitis given high lipase and SBP given diffuse tenderness CT abdomen does not show any evidence of pancreatitis  -Proceed with diagnostic paracentesis, this has been ordered via IR and they have called for him otherwise we can assist -Empiric ceftriaxone   Severe sepsis/hypotension -does not need pressors at present -Midodrine once he clears swallow evaluation -DNR/DNI noted and this is appropriate, encourage palliative care conversation with goals of care, will try to avoid ICU admission unless pressors required -If large-volume paracentesis planned, then he should receive albumin prior   Best Practice (right click and "Reselect all SmartList Selections" daily)   Diet/type: NPO DVT prophylaxis: SCD GI prophylaxis: N/A Lines: N/A Foley:  N/A Code Status:  DNR Last date of multidisciplinary goals of care discussion [per FPTS]  Labs   CBC: Recent Labs  Lab 12-22-22 0440  WBC 7.5  NEUTROABS 4.7  HGB 10.8*  HCT 32.9*  MCV 101.9*  PLT 152    Basic Metabolic Panel: Recent Labs  Lab Dec 22, 2022 0440  NA 140  K 5.0  CL 113*  CO2 18*  GLUCOSE 117*  BUN 50*  CREATININE  2.27*  CALCIUM 7.4*   GFR: Estimated Creatinine Clearance: 26.8 mL/min (A) (by C-G formula based on SCr of 2.27 mg/dL (H)). Recent Labs  Lab 12-11-22 0440  WBC 7.5    Liver Function Tests: Recent Labs  Lab 12-11-22 0440  AST 65*  ALT 66*  ALKPHOS 159*  BILITOT 1.4*  PROT 7.2  ALBUMIN 3.1*   Recent  Labs  Lab 2022/12/11 0440  LIPASE 287*   No results for input(s): "AMMONIA" in the last 168 hours.  ABG No results found for: "PHART", "PCO2ART", "PO2ART", "HCO3", "TCO2", "ACIDBASEDEF", "O2SAT"   Coagulation Profile: Recent Labs  Lab 2022/12/11 1154  INR 1.5*    Cardiac Enzymes: No results for input(s): "CKTOTAL", "CKMB", "CKMBINDEX", "TROPONINI" in the last 168 hours.  HbA1C: Hgb A1c MFr Bld  Date/Time Value Ref Range Status  10/04/2020 06:29 PM 5.3 4.8 - 5.6 % Final    Comment:    (NOTE) Pre diabetes:          5.7%-6.4%  Diabetes:              >6.4%  Glycemic control for   <7.0% adults with diabetes   09/10/2008 10:01 AM 5.1 %     CBG: No results for input(s): "GLUCAP" in the last 168 hours.  Review of Systems:   Unable to obtain  Past Medical History:  He,  has a past medical history of Acute congestive heart failure (HCC), Arthritis, Asthma, Blood in stool (04/11/2017), Chronic kidney disease, Chronic lower back pain, Dysrhythmia, ETOH abuse, Gout, Hypertension, Inguinal hernia (10/28/2014), Liver cirrhosis (HCC) (2015), Macrocytosis, Pneumonia, and Spontaneous bacterial peritonitis (HCC) (07/01/2015).   Surgical History:   Past Surgical History:  Procedure Laterality Date   INGUINAL HERNIA REPAIR Bilateral 03/30/2017   Procedure: OPEN BILATERAL INGUINAL HERNIA REPAIR WITH MESH;  Surgeon: Kinsinger, De Blanch, MD;  Location: [facility];  Service: General;  Laterality: Bilateral;  GENERAL COMBINED WITH REGIONAL FOR POST OP PAIN    INSERTION OF MESH N/A 10/16/2016   Procedure: INSERTION OF MESH;  Surgeon: Kinsinger, De Blanch, MD;  Location: [facility] OR;  Service: General;  Laterality: N/A;   INSERTION OF MESH Bilateral 03/30/2017   Procedure: INSERTION OF MESH;  Surgeon: Sheliah Hatch De Blanch, MD;  Location: [facility];  Service: General;  Laterality: Bilateral;  GENERAL COMBINED WITH REGIONAL FOR POST OP PAIN    IR PARACENTESIS  08/19/2016   IR PARACENTESIS  08/26/2016   IR  PARACENTESIS  09/02/2016   IR PARACENTESIS  09/09/2016   IR PARACENTESIS  09/16/2016   IR PARACENTESIS  10/03/2016   IR PARACENTESIS  10/11/2016   IR PARACENTESIS  10/26/2022   IR RADIOLOGIST EVAL & MGMT  09/29/2016   IR RADIOLOGIST EVAL & MGMT  12/21/2016   IR RADIOLOGIST EVAL & MGMT  01/23/2018   IR RADIOLOGIST EVAL & MGMT  02/19/2019   IR RADIOLOGIST EVAL & MGMT  03/30/2020   IR RADIOLOGIST EVAL & MGMT  03/17/2021   IR TIPS  10/11/2016   RADIOLOGY WITH ANESTHESIA N/A 10/11/2016   Procedure: TIPS;  Surgeon: Oley Balm, MD;  Location: [facility] OR;  Service: Radiology;  Laterality: N/A;   UMBILICAL HERNIA REPAIR N/A 10/16/2016   Procedure: HERNIA REPAIR UMBILICAL ADULT;  Surgeon: Kinsinger, De Blanch, MD;  Location: [facility] OR;  Service: General;  Laterality: N/A;     Social History:   reports that he has never smoked. He has been exposed to tobacco smoke. His smokeless tobacco use includes snuff and chew. He reports  that he does not drink alcohol and does not use drugs.   Family History:  His family history includes Asthma in his brother, brother, sister, and son; Heart disease in his sister; Hypertension in his brother and brother; Other in his father and mother.   Allergies No Known Allergies   Home Medications  Prior to Admission medications   Medication Sig Start Date End Date Taking? Authorizing Provider  acetaminophen (TYLENOL) 500 MG tablet Take 1 tablet (500 mg total) by mouth every 6 (six) hours as needed for mild pain (or Fever >/= 101). 10/29/22   [name], DO  albuterol (PROAIR HFA) 108 (90 Base) MCG/ACT inhaler Inhale 2 puffs into the lungs every 4 (four) hours as needed for wheezing. 05/24/18   Shirley, [place], DO  allopurinol (ZYLOPRIM) 100 MG tablet TAKE 1/2 TABLET BY MOUTH EVERY DAY Patient taking differently: Take 50 mg by mouth daily. 06/16/22   Lilland, Alana, DO  amLODipine (NORVASC) 5 MG tablet Take 1 tablet (5 mg total) by mouth daily. 05/12/22   Croitoru, Mihai, MD  bisoprolol  (ZEBETA) 5 MG tablet Take 0.5 tablets (2.5 mg total) by mouth daily. 10/30/22   [name], DO  Cholecalciferol (VITAMIN D-3) 5000 units TABS Take 1,000 Units by mouth daily.    [provider]  furosemide (LASIX) 20 MG tablet TAKE 1 TABLET BY MOUTH EVERY DAY 11/21/22   Shitarev, Dimitry, MD  lactulose (CHRONULAC) 10 GM/15ML solution TAKE 15 MLS (10 G TOTAL) BY MOUTH DAILY. 11/07/22   Lilland, Alana, DO  montelukast (SINGULAIR) 10 MG tablet TAKE 1 TABLET BY MOUTH EVERYDAY AT BEDTIME 07/15/22   Lilland, Alana, DO  Multiple Vitamin (MULTIVITAMIN WITH MINERALS) TABS tablet Take 1 tablet by mouth daily. 10/29/22   [name], DO  polyethylene glycol (MIRALAX / GLYCOLAX) 17 g packet Take 17 g by mouth daily. 10/29/22   [name], DO  spironolactone (ALDACTONE) 25 MG tablet TAKE 1 TABLET (25 MG TOTAL) BY MOUTH DAILY. 11/21/22   Shitarev, Dimitry, MD  traMADol (ULTRAM) 50 MG tablet TAKE 1 TABLET BY MOUTH EVERY DAY AT BEDTIME AS NEEDED 10/11/22   Lilland, Alana, DO  WIXELA INHUB 500-50 MCG/ACT AEPB TAKE 1 PUFF BY MOUTH TWICE A DAY Patient taking differently: Inhale 1 puff into the lungs in the morning and at bedtime. 08/17/22   Littie Deeds, MD      Cyril Mourning MD. FCCP.  Pulmonary & Critical care Pager : [phone]  If no response to pager , please call [phone] until 7 pm After 7:00 pm call Elink  [phone]   11/14/2022

## 2022-12-05 NOTE — H&P (Addendum)
Hospital Admission History and Physical Service Pager: [phone]  [name] Medical record number: [id number] Date of Birth: 16-Oct-1940 Age: 82 y.o. Gender: male  Primary Care Provider: Nelia Shi, MD Consultants: IR, Nephrology Code Status: DNR which was confirmed with family if patient unable to confirm   Preferred Emergency Contact: Rudean Hitt Information     Name Relation Home Work Mobile   [place]   [phone]   Neal,[name] Relative [phone]   Neal,Geneive Niece [phone]  [phone]      Other Contacts   None on File      Chief Complaint: Abdominal pain and distention  Assessment and Plan: Donald Moreno is a 82 y.o. male presenting with abdominal pain and distention. Differential for presentation of this includes acute vs chronic pancreatitis, choledocholithiasis, ascites in the setting of alcoholic liver cirrhosis, AAA, UTI, intestinal obstruction, constipation, gastritis, GI bleed.  Presentation most concerning for peritonitis, especially with high degree of pain and abdominal rigidity. Also contributing cirrhosis with ascites at baseline.  PMHx: PMX of CKD, CHF, Arrhythmias, ETOH abuse, Gout, HTN, Liver cirrhosis, and SBP, atrial flutter    Hospital     * (Principal) Abdominal pain  Afberile without concern for sepsis and CT abdomen/pelvis without acute changes from 1 month ago. Today, RUQ ultrasound negative for choledocholithiasis or other acute gallbladder pathology. S/p CTX 2g x1.  - Liver Doppler to ensure TIPS patency - IR diagnostic paracentesis with cell count, culture, gram stain and no fluid removal at this time - Pain control with Dilaudid 1.0 mg q2h for pain - Strict intake/output - N.p.o. for now  - CTX 2g x1, continue q 24h     Cirrhosis of liver Known liver cirrhosis secondary to history of heavy alcohol use (no alcohol use in past 30 years), AST and ALT in 60s  with Tbili 1.4. Abdomen with large volume ascites on CT abdomen/pelvis, and may require therapeutic paracentesis while admitted -Monitor ascites -Start lactulose 10 g enema while NPO -Liver Doppler to ensure TIPS patency     CKD (chronic kidney disease) stage 4, GFR 15-29 ml/min (HCC)     Unfortunately progressed from stage III to stage IV CKD within the past  month.  Reassuringly, creatinine is not exquisitely elevated, and is  generally near his baseline of 2. -Avoid nephrotoxic medications -Nephrology consulted, will follow along this admission.  Appreciate their  assistance -Monitor for hyperkalemia given his history.  Potassium 5.0 at this time.     Chronic heart failure with preserved ejection fraction (HFpEF) (HCC)     S/p 500 cc bolus in ED.  Lungs rhonchorous with crackles, concerning  for fluid overload, however no lower extremity edema. Most recent echo 10/26/2022 reviewed with LVEF 55-60%, diastolic parameters  indeterminate -Lasix 40 mg x 1, redose as necessary -Monitor I's/O -SpO2 as indicated, goal greater than 90%.  If requiring increasing needs  of oxygen supplementation, would obtain repeat chest x-ray and redose  Diuretic     Elevated lipase     Lipase 287.  While he could have acute pancreatitis, especially given  his exquisite pain on exam, it is more likely secondary to his ascites. Also, chronic liver disease may be contributing to elevation in lipase. Reassuringly, CT abdomen/pelvis did not show any acute changes of the  pancreas at this time. -Monitor, rescan if concern for worsening abdominal pain      HYPERTENSION, BENIGN SYSTEMIC     S/p 500 cc bolus in  ED.  Now diuresing.  Will need to closely monitor  MAP (goal greater than 65) in setting of diuresis and likely hepatorenal  syndrome with underlying stage IV CKD -Hold antihypertensives for now (home amlodipine)      Advanced directives, counseling/discussion     Patient is DNR/DNI.  Advanced  directives reviewed.      Bradycardia with hx atrial flutter    Atrial flutter in the 50s on EKG. Chronic problem. -Cardiac telemetry      Macrocytic anemia     Hemoglobin at baseline, 10.8 on admission.  MCV 101.9, fitting with  known macrocytic cause. In the past, ferritin, iron, B12, folate all within normal limits.  Likely  underlying CKD contributing with anemia of chronic disease -Monitor with morning labs, CBC   Chronic conditions, stable -Asthma: A bit tachypneic, but likely secondary to his pain.  Continue montelukast and Wixela (formulary equivalent acceptable) and albuterol PRN wheezing/SOB -Esophageal varices: Hold bisoprolol -History of alcohol use disorder: Has not used alcohol in 30 years and CIWA's are not necessary at this time  FEN/GI: N.p.o. VTE Prophylaxis: Lovenox (prophylactic dose)  Disposition: Progressive  History of Present Illness:  Donald Moreno is a 82 y.o. male presenting with abdominal pain and distention.  Reports worsening abdominal pain, he thinks this is much worse than his last admission.  He says he ate steak and sweet potatoes last night.  He says he had a bowel movement last night.  He says that he "pukes a lot."  Unable to localize his pain, but says is all over his abdomen.  Denies any fevers or chills.  No hematemesis or blood in the stool.  He is able to tell me that he is in the hospital, the year is in the 2000's.  Oriented to self.  He is aware of why he is here.  He was recently admitted 10/25/2022 with same complaint and clinical picture.  At that time, he was diuresed with Lasix and had a therapeutic and diagnostic paracentesis which improved his pain. GI was consulted and he had a HIDA scan that did not show evidence of cholecystitis.  He was to follow-up outpatient with Dr. Dulce [facility]ellar [facility] for EGD to survey esophageal varices, and he did see them on 11/01/2022.  In the ED, he was tachypneic with RR in the 20s, bradycardic in the  40s and 50s with generally normotensive or lower blood pressures.  He received 500 cc LR bolus, Dilaudid 1 mg x 2 and Rocephin 2 g x 1.  CT abdomen/pelvis showed large volume ascites but otherwise unchanged from recent admission.  He was admitted for pain control and further evaluation and management of abdominal distention with likely need for paracentesis.  Review Of Systems: Per HPI   Pertinent Past Medical History: Gout CHF Asthma Esophageal varices Hypertension CKD Atrial flutter Cirrhosis of liver secondary to alcohol use, history of SBP AVN of left hip  Remainder reviewed in history tab.   Pertinent Past Surgical History: Past Surgical History:  Procedure Laterality Date   INGUINAL HERNIA REPAIR Bilateral 03/30/2017   Procedure: OPEN BILATERAL INGUINAL HERNIA REPAIR WITH MESH;  Surgeon: Kinsinger, De Blanch, MD;  Location: [facility];  Service: General;  Laterality: Bilateral;  GENERAL COMBINED WITH REGIONAL FOR POST OP PAIN    INSERTION OF MESH N/A 10/16/2016   Procedure: INSERTION OF MESH;  Surgeon: Sheliah Hatch De Blanch, MD;  Location: [facility];  Service: General;  Laterality: N/A;   INSERTION OF MESH Bilateral 03/30/2017  Procedure: INSERTION OF MESH;  Surgeon: Kinsinger, De Blanch, MD;  Location: [facility];  Service: General;  Laterality: Bilateral;  GENERAL COMBINED WITH REGIONAL FOR POST OP PAIN    IR PARACENTESIS  08/19/2016   IR PARACENTESIS  08/26/2016   IR PARACENTESIS  09/02/2016   IR PARACENTESIS  09/09/2016   IR PARACENTESIS  09/16/2016   IR PARACENTESIS  10/03/2016   IR PARACENTESIS  10/11/2016   IR PARACENTESIS  10/26/2022   IR RADIOLOGIST EVAL & MGMT  09/29/2016   IR RADIOLOGIST EVAL & MGMT  12/21/2016   IR RADIOLOGIST EVAL & MGMT  01/23/2018   IR RADIOLOGIST EVAL & MGMT  02/19/2019   IR RADIOLOGIST EVAL & MGMT  03/30/2020   IR RADIOLOGIST EVAL & MGMT  03/17/2021   IR TIPS  10/11/2016   RADIOLOGY WITH ANESTHESIA N/A 10/11/2016   Procedure: TIPS;  Surgeon: Oley Balm, MD;   Location: [facility] OR;  Service: Radiology;  Laterality: N/A;   UMBILICAL HERNIA REPAIR N/A 10/16/2016   Procedure: HERNIA REPAIR UMBILICAL ADULT;  Surgeon: Kinsinger, De Blanch, MD;  Location: [facility];  Service: General;  Laterality: N/A;     Remainder reviewed in history tab.   Pertinent Social History: Tobacco use: No cigarette use, but uses chewing tobacco daily Alcohol use: Former significant alcohol use (daily liquor use, last drink 30 years ago) Other Substance use: Denies Lives with nephew and niece  Pertinent Family History: None pertinent  Remainder reviewed in history tab.   Important Outpatient Medications: Amlodipine for hypertension Albuterol as needed for asthma Allopurinol for gout Bisoprolol for history of esophageal varices Singulair for asthma Tramadol for pain Wixela for asthma  Remainder reviewed in medication history.   Objective: BP 109/62   Pulse (!) 56   Temp 98.7 F (37.1 C) (Oral)   Resp (!) 32   Ht 5\' 10"  (1.778 m)   Wt 74.8 kg   SpO2 98%   BMI 23.68 kg/m  Exam: General: Acute on chronically ill-appearing, in mild distress with groaning Eyes: Chronic aging of the eye, somewhat bilateral conjunctival injection, pupils PERRLA, opens eyes spontaneously. ENTM: Edentulous.  Tacky mucous membranes Neck: JVD noted Cardiovascular: Sinus bradycardia in the 50s, no murmurs appreciated Respiratory: Rhonchorous breath sounds throughout with mild expiratory wheezing and bibasilar crackles when listen anteriorly and laterally, limited secondary to patient inability to sit upright for exam Gastrointestinal: Quite tender throughout abdomen.  Tense and distended. MSK: No joint deformities Derm: No rashes or lesions on exposed skin Neuro: Somewhat garbled speech likely secondary to edentulous mouth, but makes complete sentences.  Oriented to self, location, reasoning for presentation to ED  Labs:  CBC BMET  Recent Labs  Lab 11/21/2022 0440  WBC 7.5  HGB 10.8*   HCT 32.9*  PLT 152   Recent Labs  Lab 12/03/2022 0440  NA 140  K 5.0  CL 113*  CO2 18*  BUN 50*  CREATININE 2.27*  GLUCOSE 117*  CALCIUM 7.4*    Lipase     Component Value Date/Time   LIPASE 287 (H) 11/16/2022 0440    EKG: Atrial flutter, a lot of artifact   Imaging Studies Performed:  US Abdomen Limited RUQ (LIVER/GB)  Result Date: 12/11/2022 CLINICAL DATA:  [id number] Cholelithiasis [id number] Abdominal pain [id number] EXAM: ULTRASOUND ABDOMEN LIMITED RIGHT UPPER QUADRANT COMPARISON:  CT scan abdomen and pelvis from earlier the same day. FINDINGS: Gallbladder: Partially distended gallbladder. There is mild diffuse gallbladder wall thickening, nonspecific but likely secondary to systemic causes. There  are multiple gallstones better seen on the prior CT scan. Sonographic Murphy's sign was negative as per the technologist. Common bile duct: Diameter: Less than 5 mm. Liver: Limited evaluation on the provided images. There is increased hepatic echogenicity which reduces the sensitivity of ultrasound for the detection of focal masses. That being said, no focal mass is identified. Portal vein is patent on color Doppler imaging with normal direction of blood flow towards the liver. Other: Ascites noted. IMPRESSION: 1. Cholelithiasis. Mild diffuse gallbladder wall thickening, nonspecific but likely secondary to systemic causes. 2. Increased hepatic echogenicity, a nonspecific finding that is most commonly seen on the basis of steatosis or liver parenchymal disease. 3. Ascites. Electronically Signed   By: Jules Schick M.D.   On: 11/30/2022 09:21   CT ABDOMEN PELVIS WO CONTRAST  Result Date: 11/21/2022 CLINICAL DATA:  Acute, nonlocalized abdominal pain EXAM: CT ABDOMEN AND PELVIS WITHOUT CONTRAST TECHNIQUE: Multidetector CT imaging of the abdomen and pelvis was performed following the standard protocol without IV contrast. RADIATION DOSE REDUCTION: This exam was performed according to the  departmental dose-optimization program which includes automated exposure control, adjustment of the mA and/or kV according to patient size and/or use of iterative reconstruction technique. COMPARISON:  10/26/2022 FINDINGS: Lower chest:  Cardiomegaly.  Atelectatic type density at the bases. Hepatobiliary: Tips stent is present. Stable appearance of the liver.Multiple gallstones. No biliary dilatation. Pancreas: Unremarkable. Spleen: Unremarkable. Adrenals/Urinary Tract: Negative adrenals. No hydronephrosis or stone. Unremarkable bladder. Stomach/Bowel:  No obstruction. No appendicitis. Vascular/Lymphatic: No acute vascular abnormality. No mass or adenopathy. Reproductive:No pathologic findings. Other: Generalized low-density ascites which is large volume. Musculoskeletal: No acute abnormalities. Remote and healed left posterior rib fractures. Avascular necrosis of the femoral heads with hip degeneration. IMPRESSION: 1. No acute finding or change from 1 month prior. 2. Cirrhosis with large volume ascites. 3. Cholelithiasis. Electronically Signed   By: Tiburcio Pea M.D.   On: 12/04/2022 07:19   DG Abdomen 1 View  Result Date: 12/11/2022 CLINICAL DATA:  82 year old male with history of abdominal pain and abdominal distention and tenderness. EXAM: ABDOMEN - 1 VIEW COMPARISON:  Abdominal radiograph 10/28/2022. FINDINGS: No pathologic dilatation of small bowel. Gas and stool are noted in the colon and rectum. No definite pneumoperitoneum. Increased density over the abdomen suggesting ascites. IMPRESSION: 1. Nonspecific, nonobstructive bowel gas pattern, as above. 2. Probable ascites. Electronically Signed   By: Trudie Reed M.D.   On: 12/11/2022 05:56   DG Chest Portable 1 View  Result Date: 11/26/2022 CLINICAL DATA:  82 year old male with history of abdominal pain and distention. EXAM: PORTABLE CHEST 1 VIEW COMPARISON:  Chest x-ray 10/26/2022. FINDINGS: Lung volumes are low. There is cephalization of the  pulmonary vasculature and slight indistinctness of the interstitial markings suggestive of mild pulmonary edema. Small bilateral pleural effusions. No pneumothorax. Heart size is mildly enlarged. Upper mediastinal contours are within normal limits. Old healed fracture of the posterolateral right seventh rib incidentally noted. IMPRESSION: 1. The appearance of the chest suggests mild congestive heart failure, as above. 2. Low lung volumes. Electronically Signed   By: Trudie Reed M.D.   On: 12/04/2022 05:54      Darral Dash, DO 12/05/2022, 10:13 AM PGY-3, [facility]  FPTS Intern pager: [phone], text pages welcome Secure chat group [facility] Teaching Service

## 2022-12-05 NOTE — Assessment & Plan Note (Addendum)
S/p 500 cc bolus in ED.  Now diuresing.  Will need to closely monitor MAP (goal greater than 65) in setting of diuresis and likely hepatorenal syndrome with underlying stage IV CKD -Albumin will be given after paracentesis -Hold antihypertensives for now (home amlodipine)

## 2022-12-05 NOTE — Progress Notes (Signed)
OT Cancellation Note  Patient Details Name: Donald Moreno MRN: [id number] DOB: 1941-03-10   Cancelled Treatment:    Reason Eval/Treat Not Completed: Pain limiting ability to participate Will follow up another time.  Dorrien Grunder,HILLARY 11/30/2022, 12:59 PM Luisa Dago, OT/L   Acute OT Clinical Specialist Acute Rehabilitation Services Pager [phone] Office [phone]

## 2022-12-05 NOTE — Assessment & Plan Note (Addendum)
S/p 500 cc bolus in ED.  Lungs rhonchorous with crackles, concerning for fluid overload, however no lower extremity edema. Most recent echo 10/26/2022 reviewed with LVEF 55-60%, diastolic parameters indeterminate -Lasix 40 mg x 1, redose as necessary -Monitor I's/O -SpO2 as indicated, goal greater than 90%.  If requiring increasing needs of oxygen supplementation, would obtain repeat chest x-ray and redose diuretic

## 2022-12-05 NOTE — Assessment & Plan Note (Signed)
Sinus bradycardia in the 50s. Chronic problem. No evidence of heart block seen on EKG. -Cardiac telemetry

## 2022-12-05 NOTE — Hospital Course (Signed)
Mr. Coster is an 82 yo M with PMH of alcoholic liver cirrhosis s/p TIPS procedure in 2016 with h/o SBP, portal gastropathy, and Grade II esophageal varices, HTN, CKD stage 4, gout, asthma who presented with acute onset abdominal pain starting suddenly overnight.   Abdominal pain  Concerning for SBP versus peritonitis.  CT abdomen/pelvis Showed no acute changes and showed cholelithiasis.  Right upper quadrant ultrasound without cholecystitis or CBD dilation and ascites.  Ultrasound of the liver ensured patency of TIPS.  Small volume diagnostic paracentesis performed.  Fluid showed milky turbid fluid, no growth seen on micro.  Hypotension Did receive fluid bolus in the ED however patient develops fluid overload with this.  Avoided further fluids in the hospital.  Midodrine was started by nephrology however patient could not tolerate anything oral. Critical care was consulted however was not a good candidate for pressors and further discussion with family leaning towards more comfort measures and avoiding pressors/ICU transfer.  Acute encephalopathy Secondary to infection versus hepatic encephalopathy.  Received lactulose enemas.  Troponin negative and flat.  Acute respiratory distress Developed fluid overload after bolus in ED.  Was given IV Lasix x 1.  Started on 2 L nasal cannula for work of breathing.  Acute Renal Failure Unfortunately progressed from stage III to stage IV CKD within the past month. Cr elevated to 4.26 from 2.27 at admission. No urine output overnight. Hyperkalemia overnight likely due to significant renal failure, ATN, HRS and was treated overnight. Dialysis not within GOC.   Palliative Due to patient's decompensated cirrhosis and acute renal failure, the family decided to go full comfort care for him with his DNR/DNI status and to respect his wishes. He was given oxycodone and Dilaudid for pain control.

## 2022-12-05 NOTE — Progress Notes (Signed)
Called and updated [facility] on patient's condition. He was surprised patient wanted to be DNR/DNI this admission as his wishes have been different in the past. When asked about advancing level of care to potentially include ICU level of care - Mr. Jennette Kettle said that he believed the patient would want all interventions based on prior conversations.   Mr. Jennette Kettle wanted to call his wife and discuss this with her - they will call back when they are ready to resume talking about GOC.   [name], DO [facility], PGY-2 December 27, 2022 2:14 PM

## 2022-12-05 NOTE — Progress Notes (Signed)
Patient currently on 2W med tele. Placed order to transfer patient to progressive floor given acuity of condition.  H&P to follow shortly.  Darral Dash, DO PGY-3 [facility]

## 2022-12-05 NOTE — Consult Note (Signed)
Nephrology Consult   Requesting provider: [facility] requesting consult: Fam Med Reason for consult: CKD IV   Assessment/Recommendations: Donald Moreno is a/an 82 y.o. male with a past medical history CKD IV, CHF, ETOH use disorder, Gout, HTN, cirrhosis c/b SBP who present w/ abdominal pain  Abdominal Pain: mild distention but very tender and has some degree of AMS. Very concerning for SBP. Diagnostic para ASAP -if studies negative for SBP can do LVP with 3-5L removed and replaced with 50g of 25% albumin  CKD IV: Crt 2.3 and baseline recently around 2. No significant AKI at this time but is at risk given his acute illness. -No LVP for now -Eval for SBP as above  -Will start midodrine 10mg  BID given low Bps -Obtain UA and Urine Na -Consider albumin; will need it if he has SBP. If he doesn't I don't feel strongly about empiric use given serum albumin is 3.1 -hold further lasix, spiro, amlodipine, and bisoprolol -Continue to monitor daily Cr, Dose meds for GFR -Monitor Daily I/Os, Daily weight  -Maintain MAP>65 for optimal renal perfusion.  -Avoid nephrotoxic medications including NSAIDs -Use synthetic opioids (Fentanyl/Dilaudid) if needed  AMS: mild could be 2/2 infection and/or some HE. Mgmt per primary  HTN: hold home BP meds; unfortunately received this morning  Metabolic acidosis: Mild with bicarb of 18.  Continue to monitor  Anemia: Mild with hemoglobin of 10.8.  Continue to monitor and transfuse per primary team as needed   Recommendations conveyed to primary service.    Darnell Level [facility] 12/01/2022 10:47 AM   _____________________________________________________________________________________ CC: Abdominal pain  History of Present Illness: Donald Moreno is a/an 82 y.o. male with a past medical history of CKD IV, CHF, ETOH use disorder, Gout, HTN, cirrhosis c/b SBP who presents with abdominal pain.  The patient was somewhat  confused so history was predominately obtained per chart review.  Patient presented to the hospital today because of worsening abdominal pain and distention at home.  Patient states he has been having vomiting at home.  Pain has been worsening for several days and has been generalized throughout his abdomen.  No fevers or chills that he has noted.  No blood in his emesis.  Unable to ascertain whether he has diarrhea, blood in his stool, shortness of breath, chest pain.  He was admitted in June for abdominal pain and distention.  No SBP at that time.  He had an LVP during this hospitalization.  Creatinine was around 2 during that time.  He does have a history of SBP.  He is followed in nephrology clinic and is felt to have arterionephrosclerosis with some contribution from liver disease.  In the emergency department the patient was having borderline low blood pressures.  He received some IV fluids as well as pain control and Rocephin.  CT abdomen pelvis demonstrated ascites and he was admitted for further management.  Patient did receive his home blood pressure medications   Medications:  Current Facility-Administered Medications  Medication Dose Route Frequency Provider Last Rate Last Admin   albuterol (PROVENTIL) (2.5 MG/3ML) 0.083% nebulizer solution 2.5 mg  2.5 mg Nebulization Q6H PRN Francena Hanly, [facility]       [START ON Dec 19, 2022] cefTRIAXone (ROCEPHIN) 2 g in sodium chloride 0.9 % 100 mL IVPB  2 g Intravenous Q24H [name], DO       enoxaparin (LOVENOX) injection 30 mg  30 mg Subcutaneous Q24H Dameron, Marisa, DO   30 mg at 11/19/2022 [phone]  HYDROmorphone (DILAUDID) injection 1 mg  1 mg Intravenous Q3H PRN [name], DO       lactulose [facility]) enema 200 gm  300 mL Rectal Once [name], DO       mometasone-formoterol (DULERA) 200-5 MCG/ACT inhaler 2 puff  2 puff Inhalation BID Darral Dash, DO         ALLERGIES Patient has no known allergies.  MEDICAL HISTORY Past  Medical History:  Diagnosis Date   Acute congestive heart failure (HCC)    Arthritis    "all over"   Asthma    Blood in stool 04/11/2017   Chronic kidney disease    "I see a kidney dr @ [facility]" (10/11/2016)   Chronic lower back pain    "turned a truck over a long time ago" (10/11/2016)   Dysrhythmia    bradycardia with occassional junctional rhythm   ETOH abuse    Gout    Hypertension    Inguinal hernia 10/28/2014   S/P inguinal hernia repair with 4L as ascites removed 03/30/17   Liver cirrhosis (HCC) 2015   Macrocytosis    Pneumonia    "long long time ago" (10/11/2016)   Spontaneous bacterial peritonitis (HCC) 07/01/2015     SOCIAL HISTORY Social History   Socioeconomic History   Marital status: Divorced    Spouse name: Not on file   Number of children: 5   Years of education: 10   Highest education level: Not on file  Occupational History   Occupation: Retired- truck Hospital doctor  Tobacco Use   Smoking status: Never    Passive exposure: Current   Smokeless tobacco: Current    Types: Snuff, Chew   Tobacco comments:    Started chewing tobacco at 82 years old     Currently dip snuff only at this time - 11/09/22  Vaping Use   Vaping status: Never Used  Substance and Sexual Activity   Alcohol use: No    Comment: quit drinking "over 2 yr ago" (10/11/2016)   Drug use: No   Sexual activity: Not Currently  Other Topics Concern   Not on file  Social History Narrative   Lives with nephew and niece in [place] home.     Tobacco: snuff all day (can last two days. Never smoked.       Hobbies: cooking, sleeping, going to store   Pets:  Dog, Bean   Social Determinants of Health   Financial Resource Strain: Low Risk  (12/02/2022)   Overall Financial Resource Strain (CARDIA)    Difficulty of Paying Living Expenses: Not hard at all  Food Insecurity: No Food Insecurity (12/02/2022)   Hunger Vital Sign    Worried About Running Out of Food in the Last Year: Never true    Ran  Out of Food in the Last Year: Never true  Transportation Needs: No Transportation Needs (12/02/2022)   PRAPARE - Administrator, Civil Service (Medical): No    Lack of Transportation (Non-Medical): No  Physical Activity: Sufficiently Active (12/02/2022)   Exercise Vital Sign    Days of Exercise per Week: 5 days    Minutes of Exercise per Session: 30 min  Stress: No Stress Concern Present (12/02/2022)   [employer] of Occupational Health - Occupational Stress Questionnaire    Feeling of Stress : Not at all  Social Connections: Moderately Isolated (12/02/2022)   Social Connection and Isolation Panel [NHANES]    Frequency of Communication with Friends and Family: More than three times  a week    Frequency of Social Gatherings with Friends and Family: Three times a week    Attends Religious Services: 1 to 4 times per year    Active Member of Clubs or Organizations: No    Attends Banker Meetings: Never    Marital Status: Divorced  Catering manager Violence: Not At Risk (12/02/2022)   Humiliation, Afraid, Rape, and Kick questionnaire    Fear of Current or Ex-Partner: No    Emotionally Abused: No    Physically Abused: No    Sexually Abused: No     FAMILY HISTORY Family History  Problem Relation Age of Onset   Other Mother        died when pt was only 38 - unknown cause.   Other Father        deceased.   Asthma Sister    Heart disease Sister        multiple stents.   Asthma Brother    Hypertension Brother    Asthma Son    Asthma Brother    Hypertension Brother       Review of Systems: Review of systems performed as able, unable to obtain full review of systems due to the patient's altered mental status  Physical Exam: Vitals:   12/16/22 0830 December 16, 2022 0845  BP: 107/62 109/62  Pulse: (!) 51 (!) 56  Resp: 20 (!) 32  Temp:    SpO2: 99% 98%   No intake/output data recorded.  Intake/Output Summary (Last 24 hours) at Dec 16, 2022 1047 Last data  filed at Dec 16, 2022 0654 Gross per 24 hour  Intake 500 ml  Output --  Net 500 ml   General: Acutely ill-appearing, lying in bed, rolling in pain HEENT: anicteric sclera, oropharynx clear without lesions CV: Bradycardic, no murmurs, no peripheral edema Lungs: clear to auscultation bilaterally, normal work of breathing wake and shallow breathing Abd: Hard abdomen, guarding present, mild to moderate distention Skin: no visible lesions or rashes Psych: Awake and alert, appropriate mood and affect Musculoskeletal: no obvious deformities Neuro: normal speech, oriented to person and place but not time  Test Results Reviewed Lab Results  Component Value Date   NA 140 12/16/2022   K 5.0 12-16-2022   CL 113 (H) 12-16-22   CO2 18 (L) 16-Dec-2022   BUN 50 (H) Dec 16, 2022   CREATININE 2.27 (H) December 16, 2022   CALCIUM 7.4 (L) 2022/12/16   ALBUMIN 3.1 (L) 16-Dec-2022   PHOS 3.7 04/13/2017    CBC Recent Labs  Lab 2022-12-16 0440  WBC 7.5  NEUTROABS 4.7  HGB 10.8*  HCT 32.9*  MCV 101.9*  PLT 152    I have reviewed all relevant outside healthcare records related to the patient's current hospitalization

## 2022-12-05 NOTE — Progress Notes (Signed)
Bilateral lower extremity venous study completed.   Preliminary results relayed to RN.  Please see CV Procedures for preliminary results.  Henchy Mccauley, RVT  4:18 PM 12/10/2022

## 2022-12-05 NOTE — Progress Notes (Signed)
SLP Cancellation Note  Patient Details Name: Donald Moreno MRN: [id number] DOB: Jul 25, 1940   Cancelled treatment:       Reason Eval/Treat Not Completed: Medical issues which prohibited therapy;Fatigue/lethargy limiting ability to participate  Pt currently presents with wet breath sounds/vocal quality, and has recently received medication for pain. RN and MD present in room. Will hold on BSE at this time, until pt is more appropriate to participate in swallow evaluation. Recommend continue NPO. ST will revisit next date.  Donald Moreno B. Murvin Natal, [facility], CCC-SLP Speech Language Pathologist Office: [phone]  Leigh Aurora 2022-12-19, 2:49 PM

## 2022-12-05 NOTE — ED Notes (Signed)
ED TO INPATIENT HANDOFF REPORT  ED Nurse Name and Phone #: Josh  S Name/Age/Gender Donald Moreno 82 y.o. male Room/Bed: RESUSC/RESUSC  Code Status   Code Status: Prior  Home/SNF/Other Home Patient oriented to: self, place, time, and situation Is this baseline? Yes   Triage Complete: Triage complete  Chief Complaint Pancreatitis [K85.90]  Triage Note Pt BIBEMS w/ c/o abd pain & abd distention & tenderness. As per report  pt sx started PTA   Allergies No Known Allergies  Level of Care/Admitting Diagnosis ED Disposition     ED Disposition  Admit   Condition  --   Comment  Hospital Area: [facility] [[id number]]  Level of Care: Telemetry Medical [104]  May place patient in observation at [facility] or [facility] if equivalent level of care is available:: No  Covid Evaluation: Confirmed COVID Negative  Diagnosis: Pancreatitis [202663]  Admitting Physician: Darral Dash [[id number]]  Attending Physician: Billey Co [[id number]]          B Medical/Surgery History Past Medical History:  Diagnosis Date   Acute congestive heart failure (HCC)    Arthritis    "all over"   Asthma    Blood in stool 04/11/2017   Chronic kidney disease    "I see a kidney dr @ [facility]" (10/11/2016)   Chronic lower back pain    "turned a truck over a long time ago" (10/11/2016)   Dysrhythmia    bradycardia with occassional junctional rhythm   ETOH abuse    Gout    Hypertension    Inguinal hernia 10/28/2014   S/P inguinal hernia repair with 4L as ascites removed 03/30/17   Liver cirrhosis (HCC) 2015   Macrocytosis    Pneumonia    "long long time ago" (10/11/2016)   Spontaneous bacterial peritonitis (HCC) 07/01/2015   Past Surgical History:  Procedure Laterality Date   INGUINAL HERNIA REPAIR Bilateral 03/30/2017   Procedure: OPEN BILATERAL INGUINAL HERNIA REPAIR WITH MESH;  Surgeon: Kinsinger, De Blanch, MD;  Location: [facility] ORS;  Service: General;   Laterality: Bilateral;  GENERAL COMBINED WITH REGIONAL FOR POST OP PAIN    INSERTION OF MESH N/A 10/16/2016   Procedure: INSERTION OF MESH;  Surgeon: Kinsinger, De Blanch, MD;  Location: [facility] OR;  Service: General;  Laterality: N/A;   INSERTION OF MESH Bilateral 03/30/2017   Procedure: INSERTION OF MESH;  Surgeon: Sheliah Hatch De Blanch, MD;  Location: [facility];  Service: General;  Laterality: Bilateral;  GENERAL COMBINED WITH REGIONAL FOR POST OP PAIN    IR PARACENTESIS  08/19/2016   IR PARACENTESIS  08/26/2016   IR PARACENTESIS  09/02/2016   IR PARACENTESIS  09/09/2016   IR PARACENTESIS  09/16/2016   IR PARACENTESIS  10/03/2016   IR PARACENTESIS  10/11/2016   IR PARACENTESIS  10/26/2022   IR RADIOLOGIST EVAL & MGMT  09/29/2016   IR RADIOLOGIST EVAL & MGMT  12/21/2016   IR RADIOLOGIST EVAL & MGMT  01/23/2018   IR RADIOLOGIST EVAL & MGMT  02/19/2019   IR RADIOLOGIST EVAL & MGMT  03/30/2020   IR RADIOLOGIST EVAL & MGMT  03/17/2021   IR TIPS  10/11/2016   RADIOLOGY WITH ANESTHESIA N/A 10/11/2016   Procedure: TIPS;  Surgeon: Oley Balm, MD;  Location: [facility] OR;  Service: Radiology;  Laterality: N/A;   UMBILICAL HERNIA REPAIR N/A 10/16/2016   Procedure: HERNIA REPAIR UMBILICAL ADULT;  Surgeon: Kinsinger, De Blanch, MD;  Location: [facility] OR;  Service: General;  Laterality: N/A;  A IV Location/Drains/Wounds Patient Lines/Drains/Airways Status     Active Line/Drains/Airways     Name Placement date Placement time Site Days   Peripheral IV 11/25/2022 20 G Anterior;Left;Upper Arm 12/02/2022  0446  Arm  less than 1            Intake/Output Last 24 hours  Intake/Output Summary (Last 24 hours) at 11/14/2022 0842 Last data filed at 11/29/2022 0654 Gross per 24 hour  Intake 500 ml  Output --  Net 500 ml    Labs/Imaging Results for orders placed or performed during the hospital encounter of 11/23/2022 (from the past 48 hour(s))  Comprehensive metabolic panel     Status: Abnormal   Collection Time: 12/01/2022   4:40 AM  Result Value Ref Range   Sodium 140 135 - 145 mmol/L   Potassium 5.0 3.5 - 5.1 mmol/L   Chloride 113 (H) 98 - 111 mmol/L   CO2 18 (L) 22 - 32 mmol/L   Glucose, Bld 117 (H) 70 - 99 mg/dL    Comment: Glucose reference range applies only to samples taken after fasting for at least 8 hours.   BUN 50 (H) 8 - 23 mg/dL   Creatinine, Ser 1.30 (H) 0.61 - 1.24 mg/dL   Calcium 7.4 (L) 8.9 - 10.3 mg/dL   Total Protein 7.2 6.5 - 8.1 g/dL   Albumin 3.1 (L) 3.5 - 5.0 g/dL   AST 65 (H) 15 - 41 U/L   ALT 66 (H) 0 - 44 U/L   Alkaline Phosphatase 159 (H) 38 - 126 U/L   Total Bilirubin 1.4 (H) 0.3 - 1.2 mg/dL   GFR, Estimated 28 (L) >60 mL/min    Comment: (NOTE) Calculated using the CKD-EPI Creatinine Equation (2021)    Anion gap 9 5 - 15    Comment: Performed at [facility], [address], [place], [place] [address]  Lipase, blood     Status: Abnormal   Collection Time: 11/15/2022  4:40 AM  Result Value Ref Range   Lipase 287 (H) 11 - 51 U/L    Comment: Performed at [facility], [address], [place], [place] [address]  CBC with Diff     Status: Abnormal   Collection Time: 12/09/2022  4:40 AM  Result Value Ref Range   WBC 7.5 4.0 - 10.5 K/uL   RBC 3.23 (L) 4.22 - 5.81 MIL/uL   Hemoglobin 10.8 (L) 13.0 - 17.0 g/dL   HCT 95.2 (L) 84.1 - 32.4 %   MCV 101.9 (H) 80.0 - 100.0 fL   MCH 33.4 26.0 - 34.0 pg   MCHC 32.8 30.0 - 36.0 g/dL   RDW 40.1 02.7 - 25.3 %   Platelets 152 150 - 400 K/uL   nRBC 0.0 0.0 - 0.2 %   Neutrophils Relative % 63 %   Neutro Abs 4.7 1.7 - 7.7 K/uL   Lymphocytes Relative 26 %   Lymphs Abs 1.9 0.7 - 4.0 K/uL   Monocytes Relative 4 %   Monocytes Absolute 0.3 0.1 - 1.0 K/uL   Eosinophils Relative 6 %   Eosinophils Absolute 0.5 0.0 - 0.5 K/uL   Basophils Relative 1 %   Basophils Absolute 0.0 0.0 - 0.1 K/uL   Immature Granulocytes 0 %   Abs Immature Granulocytes 0.02 0.00 - 0.07 K/uL    Comment: Performed at [facility], [address]  [place], [place] [address]   CT ABDOMEN PELVIS WO CONTRAST  Result Date: 12/05/2022  CLINICAL DATA:  Acute, nonlocalized abdominal pain EXAM: CT ABDOMEN AND PELVIS WITHOUT CONTRAST TECHNIQUE: Multidetector CT imaging of the abdomen and pelvis was performed following the standard protocol without IV contrast. RADIATION DOSE REDUCTION: This exam was performed according to the departmental dose-optimization program which includes automated exposure control, adjustment of the mA and/or kV according to patient size and/or use of iterative reconstruction technique. COMPARISON:  10/26/2022 FINDINGS: Lower chest:  Cardiomegaly.  Atelectatic type density at the bases. Hepatobiliary: Tips stent is present. Stable appearance of the liver.Multiple gallstones. No biliary dilatation. Pancreas: Unremarkable. Spleen: Unremarkable. Adrenals/Urinary Tract: Negative adrenals. No hydronephrosis or stone. Unremarkable bladder. Stomach/Bowel:  No obstruction. No appendicitis. Vascular/Lymphatic: No acute vascular abnormality. No mass or adenopathy. Reproductive:No pathologic findings. Other: Generalized low-density ascites which is large volume. Musculoskeletal: No acute abnormalities. Remote and healed left posterior rib fractures. Avascular necrosis of the femoral heads with hip degeneration. IMPRESSION: 1. No acute finding or change from 1 month prior. 2. Cirrhosis with large volume ascites. 3. Cholelithiasis. Electronically Signed   By: Tiburcio Pea M.D.   On: 12/04/2022 07:19   DG Abdomen 1 View  Result Date: 12/07/2022 CLINICAL DATA:  82 year old male with history of abdominal pain and abdominal distention and tenderness. EXAM: ABDOMEN - 1 VIEW COMPARISON:  Abdominal radiograph 10/28/2022. FINDINGS: No pathologic dilatation of small bowel. Gas and stool are noted in the colon and rectum. No definite pneumoperitoneum. Increased density over the abdomen suggesting ascites. IMPRESSION: 1. Nonspecific, nonobstructive bowel gas  pattern, as above. 2. Probable ascites. Electronically Signed   By: Trudie Reed M.D.   On: 12/03/2022 05:56   DG Chest Portable 1 View  Result Date: 11/25/2022 CLINICAL DATA:  81 year old male with history of abdominal pain and distention. EXAM: PORTABLE CHEST 1 VIEW COMPARISON:  Chest x-ray 10/26/2022. FINDINGS: Lung volumes are low. There is cephalization of the pulmonary vasculature and slight indistinctness of the interstitial markings suggestive of mild pulmonary edema. Small bilateral pleural effusions. No pneumothorax. Heart size is mildly enlarged. Upper mediastinal contours are within normal limits. Old healed fracture of the posterolateral right seventh rib incidentally noted. IMPRESSION: 1. The appearance of the chest suggests mild congestive heart failure, as above. 2. Low lung volumes. Electronically Signed   By: Trudie Reed M.D.   On: 12/13/2022 05:54    Pending Labs Unresulted Labs (From admission, onward)     Start     Ordered   12/01/2022 0425  Urinalysis, Routine w reflex microscopic -Urine, Clean Catch  Once,   URGENT       Question:  Specimen Source  Answer:  Urine, Clean Catch   11/19/2022 0426            Vitals/Pain Today's Vitals   12/11/2022 0745 11/29/2022 0755 12/13/2022 0800 11/17/2022 0805  BP: 108/62  (!) 92/56 (!) 110/55  Pulse: (!) 56 (!) 48 (!) 50 (!) 51  Resp: (!) 28 (!) 30 (!) 22 (!) 30  Temp:      TempSrc:      SpO2: 98% 99% 99% 99%  Weight:      Height:      PainSc:        Isolation Precautions No active isolations  Medications Medications  HYDROmorphone (DILAUDID) injection 1 mg (1 mg Intravenous Given 11/24/2022 0536)  lactated ringers bolus 500 mL (0 mLs Intravenous Stopped 11/15/2022 0654)  cefTRIAXone (ROCEPHIN) 2 g in sodium chloride 0.9 % 100 mL IVPB (0 g Intravenous Stopped 12/01/2022 0809)  HYDROmorphone (DILAUDID) injection 1  mg (1 mg Intravenous Given 12/01/2022 0751)    Mobility walks     Focused Assessments Ascites.      R Recommendations: See Admitting Provider Note  Report given to:   Additional Notes: pt does ont have teeth in and is a little difficult to understand

## 2022-12-05 NOTE — ED Triage Notes (Signed)
Pt BIBEMS w/ c/o abd pain & abd distention & tenderness. As per report  pt sx started PTA

## 2022-12-06 DIAGNOSIS — R1030 Lower abdominal pain, unspecified: Secondary | ICD-10-CM | POA: Diagnosis not present

## 2022-12-06 LAB — CBC WITH DIFFERENTIAL/PLATELET
Abs Immature Granulocytes: 0.09 10*3/uL — ABNORMAL HIGH (ref 0.00–0.07)
Abs Immature Granulocytes: 0.16 10*3/uL — ABNORMAL HIGH (ref 0.00–0.07)
Basophils Absolute: 0 10*3/uL (ref 0.0–0.1)
Basophils Absolute: 0 10*3/uL (ref 0.0–0.1)
Basophils Relative: 0 %
Basophils Relative: 0 %
Eosinophils Absolute: 0 10*3/uL (ref 0.0–0.5)
Eosinophils Absolute: 0 10*3/uL (ref 0.0–0.5)
Eosinophils Relative: 0 %
Eosinophils Relative: 0 %
HCT: 34.3 % — ABNORMAL LOW (ref 39.0–52.0)
HCT: 34.5 % — ABNORMAL LOW (ref 39.0–52.0)
Hemoglobin: 11.1 g/dL — ABNORMAL LOW (ref 13.0–17.0)
Hemoglobin: 11.2 g/dL — ABNORMAL LOW (ref 13.0–17.0)
Immature Granulocytes: 1 %
Immature Granulocytes: 1 %
Lymphocytes Relative: 10 %
Lymphocytes Relative: 9 %
Lymphs Abs: 1.4 10*3/uL (ref 0.7–4.0)
Lymphs Abs: 1.6 10*3/uL (ref 0.7–4.0)
MCH: 33.5 pg (ref 26.0–34.0)
MCH: 34.2 pg — ABNORMAL HIGH (ref 26.0–34.0)
MCHC: 32.4 g/dL (ref 30.0–36.0)
MCHC: 32.5 g/dL (ref 30.0–36.0)
MCV: 103.3 fL — ABNORMAL HIGH (ref 80.0–100.0)
MCV: 105.5 fL — ABNORMAL HIGH (ref 80.0–100.0)
Monocytes Absolute: 0.8 10*3/uL (ref 0.1–1.0)
Monocytes Absolute: 0.8 10*3/uL (ref 0.1–1.0)
Monocytes Relative: 5 %
Monocytes Relative: 5 %
Neutro Abs: 13.4 10*3/uL — ABNORMAL HIGH (ref 1.7–7.7)
Neutro Abs: 13.6 10*3/uL — ABNORMAL HIGH (ref 1.7–7.7)
Neutrophils Relative %: 84 %
Neutrophils Relative %: 85 %
Platelets: 151 10*3/uL (ref 150–400)
Platelets: 168 10*3/uL (ref 150–400)
RBC: 3.25 MIL/uL — ABNORMAL LOW (ref 4.22–5.81)
RBC: 3.34 MIL/uL — ABNORMAL LOW (ref 4.22–5.81)
RDW: 14.1 % (ref 11.5–15.5)
RDW: 14.3 % (ref 11.5–15.5)
WBC: 15.8 10*3/uL — ABNORMAL HIGH (ref 4.0–10.5)
WBC: 16.2 10*3/uL — ABNORMAL HIGH (ref 4.0–10.5)
nRBC: 0 % (ref 0.0–0.2)
nRBC: 0.1 % (ref 0.0–0.2)

## 2022-12-06 LAB — COMPREHENSIVE METABOLIC PANEL
ALT: 48 U/L — ABNORMAL HIGH (ref 0–44)
AST: 53 U/L — ABNORMAL HIGH (ref 15–41)
Albumin: 2.7 g/dL — ABNORMAL LOW (ref 3.5–5.0)
Alkaline Phosphatase: 106 U/L (ref 38–126)
Anion gap: 16 — ABNORMAL HIGH (ref 5–15)
BUN: 70 mg/dL — ABNORMAL HIGH (ref 8–23)
CO2: 12 mmol/L — ABNORMAL LOW (ref 22–32)
Calcium: <4 mg/dL — CL (ref 8.9–10.3)
Chloride: 112 mmol/L — ABNORMAL HIGH (ref 98–111)
Creatinine, Ser: 4.26 mg/dL — ABNORMAL HIGH (ref 0.61–1.24)
GFR, Estimated: 13 mL/min — ABNORMAL LOW (ref >60)
Glucose, Bld: 108 mg/dL — ABNORMAL HIGH (ref 70–99)
Potassium: 6.9 mmol/L (ref 3.5–5.1)
Sodium: 140 mmol/L (ref 135–145)
Total Bilirubin: 0.8 mg/dL (ref 0.3–1.2)
Total Protein: 6.5 g/dL (ref 6.5–8.1)

## 2022-12-06 LAB — BODY FLUID CULTURE W GRAM STAIN: Gram Stain: NONE SEEN

## 2022-12-06 LAB — TRIGLYCERIDES, BODY FLUIDS: Triglycerides, Fluid: 2301 mg/dL

## 2022-12-06 MED ORDER — HALOPERIDOL LACTATE 5 MG/ML IJ SOLN
0.5000 mg | INTRAMUSCULAR | Status: DC | PRN
  Administered 2022-12-06: 0.5 mg via INTRAVENOUS
  Filled 2022-12-06: qty 1

## 2022-12-06 MED ORDER — POLYVINYL ALCOHOL 1.4 % OP SOLN: 1.0000 [drp] | Freq: Four times a day (QID) | OPHTHALMIC | Status: DC | PRN

## 2022-12-06 MED ORDER — SODIUM BICARBONATE 8.4 % IV SOLN
50.0000 meq | Freq: Once | INTRAVENOUS | Status: AC
  Administered 2022-12-06: 50 meq via INTRAVENOUS
  Filled 2022-12-06: qty 50

## 2022-12-06 MED ORDER — HYDROMORPHONE HCL 1 MG/ML IJ SOLN
0.5000 mg | Freq: Once | INTRAMUSCULAR | Status: AC | PRN
  Administered 2022-12-06: 0.5 mg via INTRAVENOUS
  Filled 2022-12-06: qty 0.5

## 2022-12-06 MED ORDER — HYDROMORPHONE HCL 1 MG/ML IJ SOLN: 1.0000 mg | INTRAMUSCULAR | Status: DC | PRN

## 2022-12-06 MED ORDER — HALOPERIDOL LACTATE 2 MG/ML PO CONC: 0.5000 mg | ORAL | Status: DC | PRN

## 2022-12-06 MED ORDER — GLYCOPYRROLATE 0.2 MG/ML IJ SOLN: 0.2000 mg | INTRAMUSCULAR | Status: DC | PRN

## 2022-12-06 MED ORDER — INSULIN ASPART 100 UNIT/ML IV SOLN
5.0000 [IU] | Freq: Once | INTRAVENOUS | Status: AC
  Administered 2022-12-06: 5 [IU] via INTRAVENOUS

## 2022-12-06 MED ORDER — HYDROMORPHONE HCL 1 MG/ML IJ SOLN
0.5000 mg | INTRAMUSCULAR | Status: DC | PRN
  Administered 2022-12-06: 0.5 mg via INTRAVENOUS
  Filled 2022-12-06: qty 0.5

## 2022-12-06 MED ORDER — ACETAMINOPHEN 325 MG PO TABS: 650.0000 mg | ORAL_TABLET | Freq: Four times a day (QID) | ORAL | Status: DC | PRN

## 2022-12-06 MED ORDER — DEXTROSE 50 % IV SOLN
1.0000 | Freq: Once | INTRAVENOUS | Status: AC
  Administered 2022-12-06: 50 mL via INTRAVENOUS
  Filled 2022-12-06: qty 50

## 2022-12-06 MED ORDER — ACETAMINOPHEN 650 MG RE SUPP: 650.0000 mg | Freq: Four times a day (QID) | RECTAL | Status: DC | PRN

## 2022-12-06 MED ORDER — CALCIUM GLUCONATE-NACL 1-0.675 GM/50ML-% IV SOLN
1.0000 g | Freq: Once | INTRAVENOUS | Status: AC
  Administered 2022-12-06: 1000 mg via INTRAVENOUS
  Filled 2022-12-06: qty 50

## 2022-12-06 MED ORDER — BIOTENE DRY MOUTH MT LIQD: 15.0000 mL | OROMUCOSAL | Status: DC | PRN

## 2022-12-06 MED ORDER — HALOPERIDOL 0.5 MG PO TABS: 0.5000 mg | ORAL_TABLET | ORAL | Status: DC | PRN

## 2022-12-06 MED ORDER — GLYCOPYRROLATE 1 MG PO TABS: 1.0000 mg | ORAL_TABLET | ORAL | Status: DC | PRN

## 2022-12-06 MED ORDER — ONDANSETRON HCL 4 MG/2ML IJ SOLN: 4.0000 mg | Freq: Four times a day (QID) | INTRAMUSCULAR | Status: DC | PRN

## 2022-12-06 MED ORDER — ONDANSETRON 4 MG PO TBDP: 4.0000 mg | ORAL_TABLET | Freq: Four times a day (QID) | ORAL | Status: DC | PRN

## 2022-12-07 LAB — BODY FLUID CULTURE W GRAM STAIN: Culture: NO GROWTH

## 2022-12-07 LAB — CYTOLOGY - NON PAP

## 2022-12-15 NOTE — Assessment & Plan Note (Addendum)
Unfortunately progressed from stage III to stage IV CKD within the past month. Cr elevated to 4.26 from 2.27 at admission. No urine output overnight. Hyperkalemia overnight likely due to significant renal failure, ATN, HRS and was treated overnight. Dialysis not within GOC.  - Avoid nephrotoxic medications

## 2022-12-15 NOTE — Assessment & Plan Note (Signed)
S/p 500 cc bolus in ED.  Lungs rhonchorous with crackles, concerning for fluid overload, however no lower extremity edema. Most recent echo 10/26/2022 reviewed with LVEF 55-60%, diastolic parameters indeterminate.

## 2022-12-15 NOTE — Progress Notes (Signed)
This chaplain responded to MD consult for EOL spiritual care. Tana Felts shadowed the visit. The Pt. RN, along with multiple nieces and nephews, are at the bedside.  The chaplain began rapport building with the family through the chaplain's  introduction and invitation for storytelling. The chaplain understands the Pt. is often the first person to say hello and many people speak to him as he walks in the neighborhood. The Pt. niece describes him as strong willed and acknowledges his personal faith with God.  The Pt. is resting without distress at the time of the visit. The family is interactive with RN care and the goal of keeping the Pt. comfortable.  The chaplain provided education on how to request a chaplain visit through the RN. The family accepted the chaplain's invitation for intercessory prayer.  Chaplain Stephanie Acre [phone]

## 2022-12-15 NOTE — Assessment & Plan Note (Addendum)
Patient is DNR/DNI.  Advanced directives reviewed.  Discussion with nephew Gregary Signs and niece Donald Moreno at bedside about patient approaching the end of life resulted in them transitioning to full comfort measures this morning.  - Transfer patient to 6N for end-of-life care. - Anticipate hospital death

## 2022-12-15 NOTE — Progress Notes (Signed)
Pt passed away this morning at 1106, no pulse was confirmed with [facility]. Family at bedside, provider was notified Pray, Milus Mallick, MD. Post mortem care performed.

## 2022-12-15 NOTE — Progress Notes (Signed)
Nephrology Follow-Up Consult note   Assessment/Recommendations: Donald Moreno is a/an 82 y.o. male with a past medical history significant for CKD IV, CHF, ETOH use disorder, Gout, HTN, cirrhosis c/b SBP who present w/ abdominal pain   Abdominal Pain: Not consistent with SBP.  Now with pain control   AKI on CKD 4: Now complicated by hyperkalemia.  Likely having significant renal failure, ATN, HRS.  Dialysis not within goals of care.  Now switching to comfort   AMS: mild could be 2/2 infection and/or some HE. Mgmt per primary   HTN: Holding home medications   Metabolic acidosis: With worsening renal failure.  Comfort as above   We will sign off   Recommendations conveyed to primary service.    Darnell Level [facility] 11/14/2022 9:51 AM  ___________________________________________________________  CC: Abdominal pain  Interval History/Subjective: Patient progressively worsened over the past 24 hours with significant rise in creatinine, persistent hypotension, worsening encephalopathy.  Discussed with family and plans for comfort care   Medications:  Current Facility-Administered Medications  Medication Dose Route Frequency Provider Last Rate Last Admin   acetaminophen (TYLENOL) tablet 650 mg  650 mg Oral Q6H PRN Alicia Amel, MD       Or   acetaminophen (TYLENOL) suppository 650 mg  650 mg Rectal Q6H PRN Alicia Amel, MD       antiseptic oral rinse (BIOTENE) solution 15 mL  15 mL Topical PRN Alicia Amel, MD       glycopyrrolate (ROBINUL) tablet 1 mg  1 mg Oral Q4H PRN Alicia Amel, MD       Or   glycopyrrolate (ROBINUL) injection 0.2 mg  0.2 mg Subcutaneous Q4H PRN Alicia Amel, MD       Or   glycopyrrolate (ROBINUL) injection 0.2 mg  0.2 mg Intravenous Q4H PRN Alicia Amel, MD       haloperidol (HALDOL) tablet 0.5 mg  0.5 mg Oral Q4H PRN Alicia Amel, MD       Or   haloperidol (HALDOL) 2 MG/ML solution 0.5 mg  0.5 mg  Sublingual Q4H PRN Alicia Amel, MD       Or   haloperidol lactate (HALDOL) injection 0.5 mg  0.5 mg Intravenous Q4H PRN Darnelle Spangle B, MD   0.5 mg at 11/19/2022 0944   HYDROmorphone (DILAUDID) injection 1 mg  1 mg Intravenous Q2H PRN Billey Co, MD       ondansetron (ZOFRAN-ODT) disintegrating tablet 4 mg  4 mg Oral Q6H PRN Alicia Amel, MD       Or   ondansetron [facility]) injection 4 mg  4 mg Intravenous Q6H PRN Alicia Amel, MD       polyvinyl alcohol (LIQUIFILM TEARS) 1.4 % ophthalmic solution 1 drop  1 drop Both Eyes QID PRN Alicia Amel, MD          Review of Systems: 10 systems reviewed and negative except per interval history/subjective  Physical Exam: Vitals:   December 06, 2022 2044 12/14/2022 0300  BP: (!) 66/42 (!) 76/49  Pulse: 77 78  Resp: 16 20  Temp: 98.2 F (36.8 C) 98.1 F (36.7 C)  SpO2: 100% 99%   No intake/output data recorded. No intake or output data in the 24 hours ending 11/25/2022 0951 Constitutional: Appearing, lying in bed, nonresponsive ENMT: ears and nose without scars or lesions, dry mucous membranes CV: normal rate, no edema Respiratory: Shallow breathing, no increased work of breathing Gastrointestinal: Mild  distention, tender to palpation Skin: no visible lesions or rashes Psych: Lethargic, not responsive   Test Results I personally reviewed new and old clinical labs and radiology tests Lab Results  Component Value Date   NA 140 December 30, 2022   K 6.9 (HH) 2022-12-30   CL 112 (H) 12-30-2022   CO2 12 (L) 2022/12/30   BUN 70 (H) Dec 30, 2022   CREATININE 4.26 (H) December 30, 2022   CALCIUM <4.0 (LL) December 30, 2022   ALBUMIN 2.7 (L) 30-Dec-2022   PHOS 3.7 04/13/2017    CBC Recent Labs  Lab 11/21/2022 0440 12/02/2022 2337 12-30-2022 0318  WBC 7.5 16.2* 15.8*  NEUTROABS 4.7 13.6* 13.4*  HGB 10.8* 11.1* 11.2*  HCT 32.9* 34.3* 34.5*  MCV 101.9* 105.5* 103.3*  PLT 152 168 151

## 2022-12-15 NOTE — Assessment & Plan Note (Addendum)
Hemoglobin at baseline, 10.8 on admission and improved to 11.2 overnight. MCV 103.3, fitting with known macrocytic cause. In the past, ferritin, iron, B12, folate all within normal limits.  Likely underlying CKD contributing with anemia of chronic disease - No labs to follow at this time

## 2022-12-15 NOTE — Plan of Care (Signed)
  Problem: Education: Goal: Knowledge of General Education information will improve Description: Including pain rating scale, medication(s)/side effects and non-pharmacologic comfort measures 12/02/2022 0700 by Gerlean Ren, RN Outcome: Not Progressing 11/29/2022 0700 by Gerlean Ren, RN Outcome: Not Progressing   Problem: Health Behavior/Discharge Planning: Goal: Ability to manage health-related needs will improve 12/05/2022 0700 by Gerlean Ren, RN Outcome: Not Progressing 12/12/2022 0700 by Gerlean Ren, RN Outcome: Not Progressing   Problem: Clinical Measurements: Goal: Ability to maintain clinical measurements within normal limits will improve 12/05/2022 0700 by Gerlean Ren, RN Outcome: Not Progressing 12/01/2022 0700 by Gerlean Ren, RN Outcome: Not Progressing Goal: Will remain free from infection 12/08/2022 0700 by Gerlean Ren, RN Outcome: Not Progressing 11/25/2022 0700 by Gerlean Ren, RN Outcome: Not Progressing Goal: Diagnostic test results will improve 11/25/2022 0700 by Gerlean Ren, RN Outcome: Not Progressing 12/13/2022 0700 by Gerlean Ren, RN Outcome: Not Progressing Goal: Respiratory complications will improve 12/10/2022 0700 by Gerlean Ren, RN Outcome: Not Progressing 11/25/2022 0700 by Gerlean Ren, RN Outcome: Not Progressing Goal: Cardiovascular complication will be avoided 12/05/2022 0700 by Gerlean Ren, RN Outcome: Not Progressing 11/16/2022 0700 by Gerlean Ren, RN Outcome: Not Progressing   Problem: Activity: Goal: Risk for activity intolerance will decrease 11/14/2022 0700 by Gerlean Ren, RN Outcome: Not Progressing 12/12/2022 0700 by Gerlean Ren, RN Outcome: Not Progressing   Problem: Nutrition: Goal: Adequate nutrition will be maintained 11/18/2022 0700 by Gerlean Ren, RN Outcome: Not Progressing 12/02/2022 0700 by Gerlean Ren,  RN Outcome: Not Progressing   Problem: Coping: Goal: Level of anxiety will decrease 12/12/2022 0700 by Gerlean Ren, RN Outcome: Not Progressing 11/20/2022 0700 by Gerlean Ren, RN Outcome: Not Progressing   Problem: Elimination: Goal: Will not experience complications related to bowel motility 11/30/2022 0700 by Gerlean Ren, RN Outcome: Not Progressing 11/27/2022 0700 by Gerlean Ren, RN Outcome: Not Progressing Goal: Will not experience complications related to urinary retention 12/10/2022 0700 by Gerlean Ren, RN Outcome: Not Progressing 11/18/2022 0700 by Gerlean Ren, RN Outcome: Not Progressing   Problem: Pain Managment: Goal: General experience of comfort will improve 12/14/2022 0700 by Gerlean Ren, RN Outcome: Not Progressing 11/15/2022 0700 by Gerlean Ren, RN Outcome: Not Progressing   Problem: Safety: Goal: Ability to remain free from injury will improve 11/15/2022 0700 by Gerlean Ren, RN Outcome: Not Progressing 12/07/2022 0700 by Gerlean Ren, RN Outcome: Not Progressing   Problem: Skin Integrity: Goal: Risk for impaired skin integrity will decrease 11/25/2022 0700 by Gerlean Ren, RN Outcome: Not Progressing 12/05/2022 0700 by Gerlean Ren, RN Outcome: Not Progressing

## 2022-12-15 NOTE — Assessment & Plan Note (Addendum)
S/p 500 cc bolus in ED.  Likely hepatorenal syndrome with underlying stage IV CKD - Hold antihypertensives

## 2022-12-15 NOTE — Progress Notes (Addendum)
Met at bedside with nephew Donald Moreno and niece Donald Moreno. Expressed that given the laboratory abnormalities noted on this morning's labs that it appears Donald Moreno is approaching the end of life.  They voiced understanding and lack of surprise at this news. They give thanks that he remained healthy and vibrant for so long, he was "up and about" as recently as Sunday. 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 They share what a special relationship Donald Moreno and Donald Moreno have shared for many, many years.   Joint decision made to transition to full comfort measures this morning. Orders have been placed. Anticipate hospital death. Will try to transfer patient to 6N for EOL care for a larger room for family visitation.   Donald Mccoy, MD 2023/01/01 8:09 AM

## 2022-12-15 NOTE — Death Summary Note (Addendum)
Family Medicine Teaching Service [facility] Death Summary  [name] Medical record number: [id number] Date of birth: 06-29-1940 Age: 82 y.o. Gender: male Date of Admission: 12-31-22  Date of Death: 2023/01/01  Admitting Physician: Darral Dash, DO  Primary Care Provider: Nelia Shi, MD Consultants: Palliative, Nephrology  Indication for Hospitalization: Abdominal distention/pain  Discharge Diagnoses/Problem List:  Principal Problem:   Abdominal pain Active Problems:   Advanced directives, counseling/discussion   Cirrhosis of liver (HCC)   CKD (chronic kidney disease) stage 4, GFR 15-29 ml/min (HCC)   HTN (hypertension), benign    Disposition: Death  Brief Hospital Course:  Donald Moreno is an 82 yo M with PMH of alcoholic liver cirrhosis s/p TIPS procedure in 2016 with h/o SBP, portal gastropathy, and Grade II esophageal varices, HTN, CKD stage 4, gout, asthma who presented with acute onset abdominal pain starting suddenly overnight.  His initial presentation was concerning for SBP versus peritonitis.  CT abdomen/pelvis showed no acute changes.  Right upper quadrant ultrasound without cholecystitis or CBD dilation and ascites.  Ultrasound of the liver ensured patency of TIPS.  Small volume diagnostic paracentesis performed which showed milky turbid fluid, no WBC or organisms but a markedly elevated LDH >1200. SAAG of 0.2, suggesting that portal hypertension was not the ultimate cause of his ascites. Unfortunately patient had persistent hypotension, encephalopathy, and evidence of acute renal failure with hyperkalemia on labs hospital day 2. Decision made in conjunction with his family to transition to comfort measures on hospital day 2. Patient passed within a few hours with family at bedside.   Significant Procedures: Paracentesis - 1.1 L of fluid - turbid, milky  Significant Labs and Imaging:  Recent Labs  Lab 2022-12-31 0440 December 31, 2022 2337 01/01/2023 0318   WBC 7.5 16.2* 15.8*  HGB 10.8* 11.1* 11.2*  HCT 32.9* 34.3* 34.5*  PLT 152 168 151   Recent Labs  Lab December 31, 2022 0440 01-01-2023 0318  NA 140 140  K 5.0 6.9*  CL 113* 112*  CO2 18* 12*  GLUCOSE 117* 108*  BUN 50* 70*  CREATININE 2.27* 4.26*  CALCIUM 7.4* <4.0*  ALKPHOS 159* 106  AST 65* 53*  ALT 66* 48*  ALBUMIN 3.1* 2.7*    Pending at time of discharge Body fluid triglycerides Body fluid cytology   Fortunato Curling, DO 01/01/23, 12:34 PM PGY-1, [facility]    I have evaluated this patient along with Dr. Fatima Blank and reviewed the above note, making necessary revisions.  Dorothyann Gibbs, MD January 01, 2023, 12:36 PM PGY-3, [facility]

## 2022-12-15 NOTE — Consult Note (Signed)
[facility] Consultation Note  Referring Provider: Family Medicine Teaching Service Primary Care Physician:  Nelia Shi, MD Primary Gastroenterologist:  Dr. Dulce Sellar  Reason for Consultation:  abdominal pain  HPI: Donald Moreno is a 82 y.o. male longstanding history of cirrhosis, prior TIPS for refractory ascites.  Presents acute abdominal pain and worsening ascites.  Acutely worsening overnight; paracentesis shows no SBP but milky ascites seen.  Now patient non-verbal and confused.  No hematemesis or hematochezia.   Past Medical History:  Diagnosis Date   Acute congestive heart failure (HCC)    Arthritis    "all over"   Asthma    Blood in stool 04/11/2017   Chronic kidney disease    "I see a kidney dr @ [facility]" (10/11/2016)   Chronic lower back pain    "turned a truck over a long time ago" (10/11/2016)   Dysrhythmia    bradycardia with occassional junctional rhythm   ETOH abuse    Gout    Hypertension    Inguinal hernia 10/28/2014   S/P inguinal hernia repair with 4L as ascites removed 03/30/17   Liver cirrhosis (HCC) 2015   Macrocytosis    Pneumonia    "long long time ago" (10/11/2016)   Spontaneous bacterial peritonitis (HCC) 07/01/2015    Past Surgical History:  Procedure Laterality Date   INGUINAL HERNIA REPAIR Bilateral 03/30/2017   Procedure: OPEN BILATERAL INGUINAL HERNIA REPAIR WITH MESH;  Surgeon: Kinsinger, De Blanch, MD;  Location: [facility];  Service: General;  Laterality: Bilateral;  GENERAL COMBINED WITH REGIONAL FOR POST OP PAIN    INSERTION OF MESH N/A 10/16/2016   Procedure: INSERTION OF MESH;  Surgeon: Kinsinger, De Blanch, MD;  Location: [facility];  Service: General;  Laterality: N/A;   INSERTION OF MESH Bilateral 03/30/2017   Procedure: INSERTION OF MESH;  Surgeon: Sheliah Hatch De Blanch, MD;  Location: [facility];  Service: General;  Laterality: Bilateral;  GENERAL COMBINED WITH REGIONAL FOR POST OP PAIN    IR PARACENTESIS  08/19/2016   IR  PARACENTESIS  08/26/2016   IR PARACENTESIS  09/02/2016   IR PARACENTESIS  09/09/2016   IR PARACENTESIS  09/16/2016   IR PARACENTESIS  10/03/2016   IR PARACENTESIS  10/11/2016   IR PARACENTESIS  10/26/2022   IR PARACENTESIS  12/13/2022   IR RADIOLOGIST EVAL & MGMT  09/29/2016   IR RADIOLOGIST EVAL & MGMT  12/21/2016   IR RADIOLOGIST EVAL & MGMT  01/23/2018   IR RADIOLOGIST EVAL & MGMT  02/19/2019   IR RADIOLOGIST EVAL & MGMT  03/30/2020   IR RADIOLOGIST EVAL & MGMT  03/17/2021   IR TIPS  10/11/2016   RADIOLOGY WITH ANESTHESIA N/A 10/11/2016   Procedure: TIPS;  Surgeon: Oley Balm, MD;  Location: [facility] OR;  Service: Radiology;  Laterality: N/A;   UMBILICAL HERNIA REPAIR N/A 10/16/2016   Procedure: HERNIA REPAIR UMBILICAL ADULT;  Surgeon: Kinsinger, De Blanch, MD;  Location: [facility];  Service: General;  Laterality: N/A;    Prior to Admission medications   Medication Sig Start Date End Date Taking? Authorizing Provider  acetaminophen (TYLENOL) 500 MG tablet Take 1 tablet (500 mg total) by mouth every 6 (six) hours as needed for mild pain (or Fever >/= 101). 10/29/22   [name], DO  albuterol (PROAIR HFA) 108 (90 Base) MCG/ACT inhaler Inhale 2 puffs into the lungs every 4 (four) hours as needed for wheezing. 05/24/18   Shirley, [place], DO  allopurinol (ZYLOPRIM) 100 MG tablet TAKE 1/2 TABLET BY MOUTH  EVERY DAY Patient taking differently: Take 50 mg by mouth daily. 06/16/22   Lilland, Alana, DO  amLODipine (NORVASC) 5 MG tablet Take 1 tablet (5 mg total) by mouth daily. 05/12/22   Croitoru, Mihai, MD  bisoprolol (ZEBETA) 5 MG tablet Take 0.5 tablets (2.5 mg total) by mouth daily. 10/30/22   [name], DO  Cholecalciferol (VITAMIN D-3) 5000 units TABS Take 1,000 Units by mouth daily.    [provider]  furosemide (LASIX) 20 MG tablet TAKE 1 TABLET BY MOUTH EVERY DAY 11/21/22   Shitarev, Dimitry, MD  lactulose (CHRONULAC) 10 GM/15ML solution TAKE 15 MLS (10 G TOTAL) BY MOUTH DAILY. 11/07/22    Lilland, Alana, DO  montelukast (SINGULAIR) 10 MG tablet TAKE 1 TABLET BY MOUTH EVERYDAY AT BEDTIME 07/15/22   Lilland, Alana, DO  Multiple Vitamin (MULTIVITAMIN WITH MINERALS) TABS tablet Take 1 tablet by mouth daily. 10/29/22   [name], DO  polyethylene glycol (MIRALAX / GLYCOLAX) 17 g packet Take 17 g by mouth daily. 10/29/22   [name], DO  spironolactone (ALDACTONE) 25 MG tablet TAKE 1 TABLET (25 MG TOTAL) BY MOUTH DAILY. 11/21/22   Shitarev, Dimitry, MD  traMADol (ULTRAM) 50 MG tablet TAKE 1 TABLET BY MOUTH EVERY DAY AT BEDTIME AS NEEDED 10/11/22   Lilland, Alana, DO  WIXELA INHUB 500-50 MCG/ACT AEPB TAKE 1 PUFF BY MOUTH TWICE A DAY Patient taking differently: Inhale 1 puff into the lungs in the morning and at bedtime. 08/17/22   Littie Deeds, MD    Current Facility-Administered Medications  Medication Dose Route Frequency Provider Last Rate Last Admin   acetaminophen (TYLENOL) tablet 650 mg  650 mg Oral Q6H PRN Alicia Amel, MD       Or   acetaminophen (TYLENOL) suppository 650 mg  650 mg Rectal Q6H PRN Alicia Amel, MD       antiseptic oral rinse (BIOTENE) solution 15 mL  15 mL Topical PRN Alicia Amel, MD       glycopyrrolate (ROBINUL) tablet 1 mg  1 mg Oral Q4H PRN Alicia Amel, MD       Or   glycopyrrolate (ROBINUL) injection 0.2 mg  0.2 mg Subcutaneous Q4H PRN Alicia Amel, MD       Or   glycopyrrolate (ROBINUL) injection 0.2 mg  0.2 mg Intravenous Q4H PRN Alicia Amel, MD       haloperidol (HALDOL) tablet 0.5 mg  0.5 mg Oral Q4H PRN Alicia Amel, MD       Or   haloperidol (HALDOL) 2 MG/ML solution 0.5 mg  0.5 mg Sublingual Q4H PRN Alicia Amel, MD       Or   haloperidol lactate (HALDOL) injection 0.5 mg  0.5 mg Intravenous Q4H PRN Alicia Amel, MD       HYDROmorphone (DILAUDID) injection 0.5 mg  0.5 mg Intravenous Once PRN Billey Co, MD       HYDROmorphone (DILAUDID) injection 1 mg  1 mg Intravenous Q2H PRN Billey Co, MD        ondansetron (ZOFRAN-ODT) disintegrating tablet 4 mg  4 mg Oral Q6H PRN Alicia Amel, MD       Or   ondansetron [facility]) injection 4 mg  4 mg Intravenous Q6H PRN Alicia Amel, MD       polyvinyl alcohol (LIQUIFILM TEARS) 1.4 % ophthalmic solution 1 drop  1 drop Both Eyes QID PRN Alicia Amel, MD  Allergies as of Jan 01, 2023   (No Known Allergies)    Family History  Problem Relation Age of Onset   Other Mother        died when pt was only 85 - unknown cause.   Other Father        deceased.   Asthma Sister    Heart disease Sister        multiple stents.   Asthma Brother    Hypertension Brother    Asthma Son    Asthma Brother    Hypertension Brother     Social History   Socioeconomic History   Marital status: Divorced    Spouse name: Not on file   Number of children: 5   Years of education: 10   Highest education level: Not on file  Occupational History   Occupation: Retired- truck Hospital doctor  Tobacco Use   Smoking status: Never    Passive exposure: Current   Smokeless tobacco: Current    Types: Snuff, Chew   Tobacco comments:    Started chewing tobacco at 82 years old     Currently dip snuff only at this time - 11/09/22  Vaping Use   Vaping status: Never Used  Substance and Sexual Activity   Alcohol use: No    Comment: quit drinking "over 2 yr ago" (10/11/2016)   Drug use: No   Sexual activity: Not Currently  Other Topics Concern   Not on file  Social History Narrative   Lives with nephew and niece in [place].     Tobacco: snuff all day (can last two days. Never smoked.       Hobbies: cooking, sleeping, going to store   Pets:  Dog, Bean   Social Determinants of Health   Financial Resource Strain: Low Risk  (12/02/2022)   Overall Financial Resource Strain (CARDIA)    Difficulty of Paying Living Expenses: Not hard at all  Food Insecurity: No Food Insecurity (12/02/2022)   Hunger Vital Sign    Worried About Running Out of Food in the  Last Year: Never true    Ran Out of Food in the Last Year: Never true  Transportation Needs: No Transportation Needs (12/02/2022)   PRAPARE - Administrator, Civil Service (Medical): No    Lack of Transportation (Non-Medical): No  Physical Activity: Sufficiently Active (12/02/2022)   Exercise Vital Sign    Days of Exercise per Week: 5 days    Minutes of Exercise per Session: 30 min  Stress: No Stress Concern Present (12/02/2022)   [employer] of Occupational Health - Occupational Stress Questionnaire    Feeling of Stress : Not at all  Social Connections: Moderately Isolated (12/02/2022)   Social Connection and Isolation Panel [NHANES]    Frequency of Communication with Friends and Family: More than three times a week    Frequency of Social Gatherings with Friends and Family: Three times a week    Attends Religious Services: 1 to 4 times per year    Active Member of Clubs or Organizations: No    Attends Banker Meetings: Never    Marital Status: Divorced  Catering manager Violence: Not At Risk (12/02/2022)   Humiliation, Afraid, Rape, and Kick questionnaire    Fear of Current or Ex-Partner: No    Emotionally Abused: No    Physically Abused: No    Sexually Abused: No    Review of Systems: Unable to obtain due to altered mental status  Physical Exam: Vital  signs in last 24 hours: Temp:  [97.5 F (36.4 C)-98.2 F (36.8 C)] 98.1 F (36.7 C) (07/23 0300) Pulse Rate:  [52-78] 78 (07/23 0300) Resp:  [16-25] 20 (07/23 0300) BP: (66-105)/(42-60) 76/49 (07/23 0300) SpO2:  [96 %-100 %] 99 % (07/23 0300)   General:   Awake, non-verbal, moribund-appearing Head:  Normocephalic and atraumatic. Eyes:  Sclera clear, no icterus.   Conjunctiva pink. Ears:  Normal auditory acuity. Nose:  No deformity, discharge,  or lesions. Mouth:  No deformity or lesions.  Oropharynx pink & moist. Neck:  Supple; no masses or thyromegaly. Lungs:  No respiratory distress Abdomen:   Moderate distended and tender with some guarding, No masses, hepatosplenomegaly or hernias noted. Normal bowel sounds, without guarding, and without rebound.     Msk:  Symmetrical without gross deformities. Normal posture. Pulses:  Normal pulses noted. Extremities:  Without clubbing or edema. Neurologic:  Awake, non-verbal, confused but can squeeze hands and follow other simple commands at times. Skin:  Intact without significant lesions or rashes. Psych:  Awake but non-verbal   Lab Results: Recent Labs    2023/01/03 0440 2023-01-03 2337 11/30/2022 0318  WBC 7.5 16.2* 15.8*  HGB 10.8* 11.1* 11.2*  HCT 32.9* 34.3* 34.5*  PLT 152 168 151   BMET Recent Labs    2023/01/03 0440 12/03/2022 0318  NA 140 140  K 5.0 6.9*  CL 113* 112*  CO2 18* 12*  GLUCOSE 117* 108*  BUN 50* 70*  CREATININE 2.27* 4.26*  CALCIUM 7.4* <4.0*   LFT Recent Labs    12/11/2022 0318  PROT 6.5  ALBUMIN 2.7*  AST 53*  ALT 48*  ALKPHOS 106  BILITOT 0.8   PT/INR Recent Labs    2023/01/03 1154  LABPROT 18.6*  INR 1.5*    Studies/Results: IR Paracentesis  Result Date: Jan 03, 2023 INDICATION: Ascites, waxing and waning mental status EXAM: ULTRASOUND GUIDED diagnostic PARACENTESIS MEDICATIONS: 5 cc 1% lidocaine COMPLICATIONS: None immediate. PROCEDURE: Informed written consent was obtained from the patient after a discussion of the risks, benefits and alternatives to treatment. A timeout was performed prior to the initiation of the procedure. Initial ultrasound scanning demonstrates a moderate amount of ascites within the right lower abdominal quadrant. The right lower abdomen was prepped and draped in the usual sterile fashion. 1% lidocaine was used for local anesthesia. Following this, a 19 gauge, 10-cm, Yueh catheter was introduced. An ultrasound image was saved for documentation purposes. The paracentesis was performed. The catheter was removed and a dressing was applied. The patient tolerated the procedure well  without immediate post procedural complication. FINDINGS: A total of approximately 1.1 L of milky-yellow fluid was removed. Samples were sent to the laboratory as requested by the clinical team. IMPRESSION: Successful ultrasound-guided paracentesis yielding 1.1 liters of peritoneal fluid. Procedure performed by Mina Marble, PA-C Electronically Signed   By: Gilmer Mor D.O.   On: 01/03/23 19:25   VAS [place] LOWER EXTREMITY VENOUS (DVT)  Result Date: 2023/01/03  Lower Venous DVT Study Patient Name:  Donald Moreno  Date of Exam:   Jan 03, 2023 Medical Rec #: [id number]       Accession #:    [id number] Date of Birth: 30-Mar-1941        Patient Gender: M Patient Age:   41 years Exam Location:  [facility] Procedure:      VAS [place] LOWER EXTREMITY VENOUS (DVT) Referring Phys: MARGARET PRAY --------------------------------------------------------------------------------  Indications: Swelling, and Pain.  Limitations: Patient unable to tolerate compressions, patient positioning.  Comparison Study: Previous RLE 10/26/22 negative.                   No previous LLE. Performing Technologist: McKayla Maag RVT, VT  Examination Guidelines: A complete evaluation includes B-mode imaging, spectral Doppler, color Doppler, and power Doppler as needed of all accessible portions of each vessel. Bilateral testing is considered an integral part of a complete examination. Limited examinations for reoccurring indications may be performed as noted. The reflux portion of the exam is performed with the patient in reverse Trendelenburg.  +--------+---------------+---------+-----------+----------+--------------------+ RIGHT   CompressibilityPhasicitySpontaneityPropertiesThrombus Aging       +--------+---------------+---------+-----------+----------+--------------------+ CFV     Full           Yes      Yes                                       +--------+---------------+---------+-----------+----------+--------------------+ SFJ      Full                                                              +--------+---------------+---------+-----------+----------+--------------------+ FV Prox                Yes      Yes                  patent by color                                                           doppler              +--------+---------------+---------+-----------+----------+--------------------+ FV Mid  Full                                                              +--------+---------------+---------+-----------+----------+--------------------+ FV      Full                                                              Distal                                                                    +--------+---------------+---------+-----------+----------+--------------------+ PFV                    Yes      Yes  patent by color                                                           doppler              +--------+---------------+---------+-----------+----------+--------------------+ POP                                                  not visualized       +--------+---------------+---------+-----------+----------+--------------------+ PTV     Full                                                              +--------+---------------+---------+-----------+----------+--------------------+ PERO                                                 not visualized       +--------+---------------+---------+-----------+----------+--------------------+   +---------+---------------+---------+-----------+----------+--------------+ LEFT     CompressibilityPhasicitySpontaneityPropertiesThrombus Aging +---------+---------------+---------+-----------+----------+--------------+ CFV      Full           Yes      Yes                                 +---------+---------------+---------+-----------+----------+--------------+ SFJ      Full                                                         +---------+---------------+---------+-----------+----------+--------------+ FV Prox  Full                                                        +---------+---------------+---------+-----------+----------+--------------+ FV Mid   Full                                                        +---------+---------------+---------+-----------+----------+--------------+ FV DistalFull                                                        +---------+---------------+---------+-----------+----------+--------------+ PFV      Full                                                        +---------+---------------+---------+-----------+----------+--------------+  POP                                                   not visualized +---------+---------------+---------+-----------+----------+--------------+ PTV      Full                                                        +---------+---------------+---------+-----------+----------+--------------+ PERO     Full                                                        +---------+---------------+---------+-----------+----------+--------------+     Summary: RIGHT: - There is no evidence of deep vein thrombosis in the lower extremity. However, portions of this examination were limited- see technologist comments above.  - No cystic structure found in the popliteal fossa. - Non-vascularized area is noted in right groin.  LEFT: - There is no evidence of deep vein thrombosis in the lower extremity. However, portions of this examination were limited- see technologist comments above.  - No cystic structure found in the popliteal fossa.  *See table(s) above for measurements and observations. Electronically signed by Lemar Livings MD on 2022-12-12 at 7:03:21 PM.    Final    US LIVER DOPPLER  Result Date: 12/12/22 CLINICAL DATA:  Status post TIPS. EXAM: DUPLEX ULTRASOUND OF LIVER AND TIPS SHUNT TECHNIQUE:  Color and duplex Doppler ultrasound was performed to evaluate the hepatic in-flow and out-flow vessels. COMPARISON:  10/28/2022 FINDINGS: Portal Vein Velocities Main:  55 cm/sec Right: Not visualized Left: Not visualized TIPS Stent Velocities Proximal-portal: 109 cm/sec Mid:  180 cm/sec Distal-hepatic: 173 cm/sec IVC: Present and patent with normal respiratory phasicity. Hepatic Vein Velocities Right:  41 cm/sec Mid: Visualized Left: Visualized Splenic Vein: 23 cm/sec Superior Mesenteric Vein: Not visualized Hepatic Artery: 139 cm/sec Ascities: Present Varices: Absent Other findings: Main portal vein measures up to 1.6 cm. Main portal vein is patent with hepatopetal flow. IMPRESSION: 1. Patent TIPS stent with normal velocities. 2. Main portal vein is patent with hepatopetal flow. 3. Technically challenging examination with limitations. Electronically Signed   By: Richarda Overlie M.D.   On: 2022-12-12 15:27   US Abdomen Limited RUQ (LIVER/GB)  Result Date: Dec 12, 2022 CLINICAL DATA:  [id number] Cholelithiasis [id number] Abdominal pain [id number] EXAM: ULTRASOUND ABDOMEN LIMITED RIGHT UPPER QUADRANT COMPARISON:  CT scan abdomen and pelvis from earlier the same day. FINDINGS: Gallbladder: Partially distended gallbladder. There is mild diffuse gallbladder wall thickening, nonspecific but likely secondary to systemic causes. There are multiple gallstones better seen on the prior CT scan. Sonographic Murphy's sign was negative as per the technologist. Common bile duct: Diameter: Less than 5 mm. Liver: Limited evaluation on the provided images. There is increased hepatic echogenicity which reduces the sensitivity of ultrasound for the detection of focal masses. That being said, no focal mass is identified. Portal vein is patent on color Doppler imaging with normal direction of blood flow towards the liver. Other: Ascites noted. IMPRESSION: 1. Cholelithiasis. Mild diffuse gallbladder wall thickening, nonspecific but likely  secondary  to systemic causes. 2. Increased hepatic echogenicity, a nonspecific finding that is most commonly seen on the basis of steatosis or liver parenchymal disease. 3. Ascites. Electronically Signed   By: Jules Schick M.D.   On: 11/20/2022 09:21   CT ABDOMEN PELVIS WO CONTRAST  Result Date: 11/25/2022 CLINICAL DATA:  Acute, nonlocalized abdominal pain EXAM: CT ABDOMEN AND PELVIS WITHOUT CONTRAST TECHNIQUE: Multidetector CT imaging of the abdomen and pelvis was performed following the standard protocol without IV contrast. RADIATION DOSE REDUCTION: This exam was performed according to the departmental dose-optimization program which includes automated exposure control, adjustment of the mA and/or kV according to patient size and/or use of iterative reconstruction technique. COMPARISON:  10/26/2022 FINDINGS: Lower chest:  Cardiomegaly.  Atelectatic type density at the bases. Hepatobiliary: Tips stent is present. Stable appearance of the liver.Multiple gallstones. No biliary dilatation. Pancreas: Unremarkable. Spleen: Unremarkable. Adrenals/Urinary Tract: Negative adrenals. No hydronephrosis or stone. Unremarkable bladder. Stomach/Bowel:  No obstruction. No appendicitis. Vascular/Lymphatic: No acute vascular abnormality. No mass or adenopathy. Reproductive:No pathologic findings. Other: Generalized low-density ascites which is large volume. Musculoskeletal: No acute abnormalities. Remote and healed left posterior rib fractures. Avascular necrosis of the femoral heads with hip degeneration. IMPRESSION: 1. No acute finding or change from 1 month prior. 2. Cirrhosis with large volume ascites. 3. Cholelithiasis. Electronically Signed   By: Tiburcio Pea M.D.   On: 11/27/2022 07:19   DG Abdomen 1 View  Result Date: 12/13/2022 CLINICAL DATA:  82 year old male with history of abdominal pain and abdominal distention and tenderness. EXAM: ABDOMEN - 1 VIEW COMPARISON:  Abdominal radiograph 10/28/2022. FINDINGS:  No pathologic dilatation of small bowel. Gas and stool are noted in the colon and rectum. No definite pneumoperitoneum. Increased density over the abdomen suggesting ascites. IMPRESSION: 1. Nonspecific, nonobstructive bowel gas pattern, as above. 2. Probable ascites. Electronically Signed   By: Trudie Reed M.D.   On: 12/08/2022 05:56   DG Chest Portable 1 View  Result Date: 11/27/2022 CLINICAL DATA:  82 year old male with history of abdominal pain and distention. EXAM: PORTABLE CHEST 1 VIEW COMPARISON:  Chest x-ray 10/26/2022. FINDINGS: Lung volumes are low. There is cephalization of the pulmonary vasculature and slight indistinctness of the interstitial markings suggestive of mild pulmonary edema. Small bilateral pleural effusions. No pneumothorax. Heart size is mildly enlarged. Upper mediastinal contours are within normal limits. Old healed fracture of the posterolateral right seventh rib incidentally noted. IMPRESSION: 1. The appearance of the chest suggests mild congestive heart failure, as above. 2. Low lung volumes. Electronically Signed   By: Trudie Reed M.D.   On: 11/19/2022 05:54    Impression:   Abdominal pain. Ascites, milky, suggestive chylous ascites. Acute renal failure. Hyperkalemia.  Plan:   Patient is moribund.  I have reviewed with family at bedside, who feel we should pursue comfort care measures, and I agree. My condolences given to family at bedside. [facility] will sign-off; please let me know if we can be of any further assistance.   LOS: 1 day   Alphia Behanna M  2022-12-16, 9:12 AM  Cell [phone] If no answer or after 5 PM call [phone]

## 2022-12-15 NOTE — Plan of Care (Signed)

## 2022-12-15 NOTE — Progress Notes (Addendum)
Alerted for critical lab values of hyperkalemia and hypocalcemia.  Went to bedside however no family members present.  Patient restless in bed eyes closed not responsive to verbal commands.  Lungs rising symmetrically with some increased work of breathing. Called [employer]. Confirmed patients date of birth.  Discussed with him that potassium is at critically high level of 6.9. Discussed risks of arrhythmia with this. Discussed that he is also showing signs of renal failure (Cr 2.27>4.26).  I suspect hepatorenal syndrome picture.  Discussed that patient is not a good candidate for dialysis.  Nephew agrees with this.  I question whether he would want any medications to help lower his potassium in the meantime waiting for his palliative conversation today.  Nephew states that he would want medications to help lower potassium.  I discussed with him that this will not change long-term prognosis for patient but could possibly give some time while awaiting palliative conversation. Nephew states understanding of this. Patient is unable to take p.o. medications.  Hyperkalemia  renal failure  metabolic acidosis -EKG -Calcium gluconate 1 g over 20 minutes -NovoLog 5 units with dextrose 50 -Sodium bicarbonate 50 mEq once -Albuterol if patient is able to wear mask -Avoiding Lasix in the setting of hypotension and renal failure -Avoiding lokelma in setting of unable to intake PO -Plan for BMP later this a.m. -Orders communicated with nursing -Consider albumin pending palliative discussions -Overall poor prognosis which was communicated with family

## 2022-12-15 NOTE — ED Provider Notes (Signed)
[facility] Provider Note   CSN: [id number] Arrival date & time: 01-01-2023  0405     History  Chief Complaint  Patient presents with   Abdominal Pain    Donald Moreno is a 82 y.o. male.  82 year old male with multiple past medical problems as below most prominent of which recently admitted for acute pancreatitis but also has a history of cirrhosis status post TIPS procedure.  Came in with sudden worsening of his abdominal pain and distention approximately 1 hour prior to arrival.  No fevers.  No nausea or vomiting.  Last bowel movement was over 24 hours ago unclear if he is passing gases is not the best historian secondary to pain and altered mental status.  Had a paracentesis done on a recent hospitalization but no fevers at home and is suddenly got worse today.   Abdominal Pain      Home Medications Prior to Admission medications   Medication Sig Start Date End Date Taking? Authorizing Provider  acetaminophen (TYLENOL) 500 MG tablet Take 1 tablet (500 mg total) by mouth every 6 (six) hours as needed for mild pain (or Fever >/= 101). 10/29/22   [name], DO  albuterol (PROAIR HFA) 108 (90 Base) MCG/ACT inhaler Inhale 2 puffs into the lungs every 4 (four) hours as needed for wheezing. 05/24/18   Shirley, [place], DO  allopurinol (ZYLOPRIM) 100 MG tablet TAKE 1/2 TABLET BY MOUTH EVERY DAY Patient taking differently: Take 50 mg by mouth daily. 06/16/22   Lilland, Alana, DO  amLODipine (NORVASC) 5 MG tablet Take 1 tablet (5 mg total) by mouth daily. 05/12/22   Croitoru, Mihai, MD  bisoprolol (ZEBETA) 5 MG tablet Take 0.5 tablets (2.5 mg total) by mouth daily. 10/30/22   [name], DO  Cholecalciferol (VITAMIN D-3) 5000 units TABS Take 1,000 Units by mouth daily.    [provider]  furosemide (LASIX) 20 MG tablet TAKE 1 TABLET BY MOUTH EVERY DAY 11/21/22   Shitarev, Dimitry, MD  lactulose (CHRONULAC) 10 GM/15ML solution TAKE 15 MLS (10 G TOTAL) BY MOUTH  DAILY. 11/07/22   Lilland, Alana, DO  montelukast (SINGULAIR) 10 MG tablet TAKE 1 TABLET BY MOUTH EVERYDAY AT BEDTIME 07/15/22   Lilland, Alana, DO  Multiple Vitamin (MULTIVITAMIN WITH MINERALS) TABS tablet Take 1 tablet by mouth daily. 10/29/22   [name], DO  polyethylene glycol (MIRALAX / GLYCOLAX) 17 g packet Take 17 g by mouth daily. 10/29/22   [name], DO  spironolactone (ALDACTONE) 25 MG tablet TAKE 1 TABLET (25 MG TOTAL) BY MOUTH DAILY. 11/21/22   Shitarev, Dimitry, MD  traMADol (ULTRAM) 50 MG tablet TAKE 1 TABLET BY MOUTH EVERY DAY AT BEDTIME AS NEEDED 10/11/22   Lilland, Alana, DO  WIXELA INHUB 500-50 MCG/ACT AEPB TAKE 1 PUFF BY MOUTH TWICE A DAY Patient taking differently: Inhale 1 puff into the lungs in the morning and at bedtime. 08/17/22   Littie Deeds, MD      Allergies    Patient has no known allergies.    Review of Systems   Review of Systems  Gastrointestinal:  Positive for abdominal pain.    Physical Exam Updated Vital Signs BP (!) 76/49 (BP Location: Right Arm)   Pulse 78   Temp 98.1 F (36.7 C) (Axillary)   Resp 20   Ht 5\' 10"  (1.778 m)   Wt 74.8 kg   SpO2 99%   BMI 23.68 kg/m  Physical Exam Vitals and nursing note reviewed.  Constitutional:  Appearance: He is well-developed.  HENT:     Head: Normocephalic and atraumatic.  Cardiovascular:     Rate and Rhythm: Normal rate.  Pulmonary:     Effort: Pulmonary effort is normal. No respiratory distress.  Abdominal:     General: Bowel sounds are decreased. There is distension.     Tenderness: There is generalized abdominal tenderness. There is no guarding or rebound.  Musculoskeletal:        General: Normal range of motion.     Cervical back: Normal range of motion.  Skin:    General: Skin is warm and dry.  Neurological:     Mental Status: He is alert.     ED Results / Procedures / Treatments   Labs (all labs ordered are listed, but only abnormal results are displayed) Labs Reviewed   COMPREHENSIVE METABOLIC PANEL - Abnormal; Notable for the following components:      Result Value   Chloride 113 (*)    CO2 18 (*)    Glucose, Bld 117 (*)    BUN 50 (*)    Creatinine, Ser 2.27 (*)    Calcium 7.4 (*)    Albumin 3.1 (*)    AST 65 (*)    ALT 66 (*)    Alkaline Phosphatase 159 (*)    Total Bilirubin 1.4 (*)    GFR, Estimated 28 (*)    All other components within normal limits  LIPASE, BLOOD - Abnormal; Notable for the following components:   Lipase 287 (*)    All other components within normal limits  CBC WITH DIFFERENTIAL/PLATELET - Abnormal; Notable for the following components:   RBC 3.23 (*)    Hemoglobin 10.8 (*)    HCT 32.9 (*)    MCV 101.9 (*)    All other components within normal limits  PROTIME-INR - Abnormal; Notable for the following components:   Prothrombin Time 18.6 (*)    INR 1.5 (*)    All other components within normal limits  LACTIC ACID, PLASMA - Abnormal; Notable for the following components:   Lactic Acid, Venous 2.0 (*)    All other components within normal limits  LACTIC ACID, PLASMA - Abnormal; Notable for the following components:   Lactic Acid, Venous 2.3 (*)    All other components within normal limits  LACTATE DEHYDROGENASE, PLEURAL OR PERITONEAL FLUID - Abnormal; Notable for the following components:   LD, Fluid 1,302 (*)    All other components within normal limits  BODY FLUID CELL COUNT WITH DIFFERENTIAL - Abnormal; Notable for the following components:   Color, Fluid MILKY (*)    Appearance, Fluid TURBID (*)    All other components within normal limits  CBC WITH DIFFERENTIAL/PLATELET - Abnormal; Notable for the following components:   WBC 16.2 (*)    RBC 3.25 (*)    Hemoglobin 11.1 (*)    HCT 34.3 (*)    MCV 105.5 (*)    MCH 34.2 (*)    Neutro Abs 13.6 (*)    Abs Immature Granulocytes 0.09 (*)    All other components within normal limits  CBC WITH DIFFERENTIAL/PLATELET - Abnormal; Notable for the following components:    WBC 15.8 (*)    RBC 3.34 (*)    Hemoglobin 11.2 (*)    HCT 34.5 (*)    MCV 103.3 (*)    Neutro Abs 13.4 (*)    Abs Immature Granulocytes 0.16 (*)    All other components within normal limits  COMPREHENSIVE METABOLIC PANEL -  Abnormal; Notable for the following components:   Potassium 6.9 (*)    Chloride 112 (*)    CO2 12 (*)    Glucose, Bld 108 (*)    BUN 70 (*)    Creatinine, Ser 4.26 (*)    Calcium <4.0 (*)    Albumin 2.7 (*)    AST 53 (*)    ALT 48 (*)    GFR, Estimated 13 (*)    Anion gap 16 (*)    All other components within normal limits  BODY FLUID CULTURE W GRAM STAIN  APTT  ALBUMIN, PLEURAL OR PERITONEAL FLUID   PROTEIN, PLEURAL OR PERITONEAL FLUID  GLUCOSE, PLEURAL OR PERITONEAL FLUID  URINALYSIS, ROUTINE W REFLEX MICROSCOPIC  SODIUM, URINE, RANDOM  OSMOLALITY, URINE  TRIGLYCERIDES, BODY FLUIDS  CYTOLOGY - NON PAP  TROPONIN I (HIGH SENSITIVITY)  TROPONIN I (HIGH SENSITIVITY)    EKG EKG Interpretation Date/Time:  Monday 2022/12/20 04:18:06 EDT Ventricular Rate:  56 PR Interval:    QRS Duration:  143 QT Interval:  483 QTC Calculation: 467 R Axis:   30  Text Interpretation: Atrial flutter with predominant 4:1 AV block Probable left ventricular hypertrophy Artifact in lead(s) II III aVF V1 V4 V5 Confirmed by Marily Memos [id number] on Dec 20, 2022 5:31:41 AM  Radiology IR Paracentesis  Result Date: 12/20/2022 INDICATION: Ascites, waxing and waning mental status EXAM: ULTRASOUND GUIDED diagnostic PARACENTESIS MEDICATIONS: 5 cc 1% lidocaine COMPLICATIONS: None immediate. PROCEDURE: Informed written consent was obtained from the patient after a discussion of the risks, benefits and alternatives to treatment. A timeout was performed prior to the initiation of the procedure. Initial ultrasound scanning demonstrates a moderate amount of ascites within the right lower abdominal quadrant. The right lower abdomen was prepped and draped in the usual sterile fashion. 1%  lidocaine was used for local anesthesia. Following this, a 19 gauge, 10-cm, Yueh catheter was introduced. An ultrasound image was saved for documentation purposes. The paracentesis was performed. The catheter was removed and a dressing was applied. The patient tolerated the procedure well without immediate post procedural complication. FINDINGS: A total of approximately 1.1 L of milky-yellow fluid was removed. Samples were sent to the laboratory as requested by the clinical team. IMPRESSION: Successful ultrasound-guided paracentesis yielding 1.1 liters of peritoneal fluid. Procedure performed by Mina Marble, PA-C Electronically Signed   By: Gilmer Mor D.O.   On: 20-Dec-2022 19:25   VAS [place] LOWER EXTREMITY VENOUS (DVT)  Result Date: 12-20-2022  Lower Venous DVT Study Patient Name:  CLIFF DAMIANI  Date of Exam:   12/20/22 Medical Rec #: [id number]       Accession #:    [id number] Date of Birth: January 31, 1941        Patient Gender: M Patient Age:   67 years Exam Location:  [facility] Procedure:      VAS [place] LOWER EXTREMITY VENOUS (DVT) Referring Phys: MARGARET PRAY --------------------------------------------------------------------------------  Indications: Swelling, and Pain.  Limitations: Patient unable to tolerate compressions, patient positioning. Comparison Study: Previous RLE 10/26/22 negative.                   No previous LLE. Performing Technologist: McKayla Maag RVT, VT  Examination Guidelines: A complete evaluation includes B-mode imaging, spectral Doppler, color Doppler, and power Doppler as needed of all accessible portions of each vessel. Bilateral testing is considered an integral part of a complete examination. Limited examinations for reoccurring indications may be performed as noted. The reflux portion of the exam is  performed with the patient in reverse Trendelenburg.  +--------+---------------+---------+-----------+----------+--------------------+ RIGHT    CompressibilityPhasicitySpontaneityPropertiesThrombus Aging       +--------+---------------+---------+-----------+----------+--------------------+ CFV     Full           Yes      Yes                                       +--------+---------------+---------+-----------+----------+--------------------+ SFJ     Full                                                              +--------+---------------+---------+-----------+----------+--------------------+ FV Prox                Yes      Yes                  patent by color                                                           doppler              +--------+---------------+---------+-----------+----------+--------------------+ FV Mid  Full                                                              +--------+---------------+---------+-----------+----------+--------------------+ FV      Full                                                              Distal                                                                    +--------+---------------+---------+-----------+----------+--------------------+ PFV                    Yes      Yes                  patent by color                                                           doppler              +--------+---------------+---------+-----------+----------+--------------------+ POP  not visualized       +--------+---------------+---------+-----------+----------+--------------------+ PTV     Full                                                              +--------+---------------+---------+-----------+----------+--------------------+ PERO                                                 not visualized       +--------+---------------+---------+-----------+----------+--------------------+   +---------+---------------+---------+-----------+----------+--------------+ LEFT      CompressibilityPhasicitySpontaneityPropertiesThrombus Aging +---------+---------------+---------+-----------+----------+--------------+ CFV      Full           Yes      Yes                                 +---------+---------------+---------+-----------+----------+--------------+ SFJ      Full                                                        +---------+---------------+---------+-----------+----------+--------------+ FV Prox  Full                                                        +---------+---------------+---------+-----------+----------+--------------+ FV Mid   Full                                                        +---------+---------------+---------+-----------+----------+--------------+ FV DistalFull                                                        +---------+---------------+---------+-----------+----------+--------------+ PFV      Full                                                        +---------+---------------+---------+-----------+----------+--------------+ POP                                                   not visualized +---------+---------------+---------+-----------+----------+--------------+ PTV      Full                                                        +---------+---------------+---------+-----------+----------+--------------+  PERO     Full                                                        +---------+---------------+---------+-----------+----------+--------------+     Summary: RIGHT: - There is no evidence of deep vein thrombosis in the lower extremity. However, portions of this examination were limited- see technologist comments above.  - No cystic structure found in the popliteal fossa. - Non-vascularized area is noted in right groin.  LEFT: - There is no evidence of deep vein thrombosis in the lower extremity. However, portions of this examination were limited- see technologist comments  above.  - No cystic structure found in the popliteal fossa.  *See table(s) above for measurements and observations. Electronically signed by Lemar Livings MD on 11/20/2022 at 7:03:21 PM.    Final    US LIVER DOPPLER  Result Date: 11/22/2022 CLINICAL DATA:  Status post TIPS. EXAM: DUPLEX ULTRASOUND OF LIVER AND TIPS SHUNT TECHNIQUE: Color and duplex Doppler ultrasound was performed to evaluate the hepatic in-flow and out-flow vessels. COMPARISON:  10/28/2022 FINDINGS: Portal Vein Velocities Main:  55 cm/sec Right: Not visualized Left: Not visualized TIPS Stent Velocities Proximal-portal: 109 cm/sec Mid:  180 cm/sec Distal-hepatic: 173 cm/sec IVC: Present and patent with normal respiratory phasicity. Hepatic Vein Velocities Right:  41 cm/sec Mid: Visualized Left: Visualized Splenic Vein: 23 cm/sec Superior Mesenteric Vein: Not visualized Hepatic Artery: 139 cm/sec Ascities: Present Varices: Absent Other findings: Main portal vein measures up to 1.6 cm. Main portal vein is patent with hepatopetal flow. IMPRESSION: 1. Patent TIPS stent with normal velocities. 2. Main portal vein is patent with hepatopetal flow. 3. Technically challenging examination with limitations. Electronically Signed   By: Richarda Overlie M.D.   On: 11/20/2022 15:27   US Abdomen Limited RUQ (LIVER/GB)  Result Date: 12/12/2022 CLINICAL DATA:  [id number] Cholelithiasis [id number] Abdominal pain [id number] EXAM: ULTRASOUND ABDOMEN LIMITED RIGHT UPPER QUADRANT COMPARISON:  CT scan abdomen and pelvis from earlier the same day. FINDINGS: Gallbladder: Partially distended gallbladder. There is mild diffuse gallbladder wall thickening, nonspecific but likely secondary to systemic causes. There are multiple gallstones better seen on the prior CT scan. Sonographic Murphy's sign was negative as per the technologist. Common bile duct: Diameter: Less than 5 mm. Liver: Limited evaluation on the provided images. There is increased hepatic echogenicity which reduces  the sensitivity of ultrasound for the detection of focal masses. That being said, no focal mass is identified. Portal vein is patent on color Doppler imaging with normal direction of blood flow towards the liver. Other: Ascites noted. IMPRESSION: 1. Cholelithiasis. Mild diffuse gallbladder wall thickening, nonspecific but likely secondary to systemic causes. 2. Increased hepatic echogenicity, a nonspecific finding that is most commonly seen on the basis of steatosis or liver parenchymal disease. 3. Ascites. Electronically Signed   By: Jules Schick M.D.   On: 11/26/2022 09:21   CT ABDOMEN PELVIS WO CONTRAST  Result Date: 11/24/2022 CLINICAL DATA:  Acute, nonlocalized abdominal pain EXAM: CT ABDOMEN AND PELVIS WITHOUT CONTRAST TECHNIQUE: Multidetector CT imaging of the abdomen and pelvis was performed following the standard protocol without IV contrast. RADIATION DOSE REDUCTION: This exam was performed according to the departmental dose-optimization program which includes automated exposure control, adjustment of the mA and/or kV according to patient size and/or use of iterative reconstruction technique.  COMPARISON:  10/26/2022 FINDINGS: Lower chest:  Cardiomegaly.  Atelectatic type density at the bases. Hepatobiliary: Tips stent is present. Stable appearance of the liver.Multiple gallstones. No biliary dilatation. Pancreas: Unremarkable. Spleen: Unremarkable. Adrenals/Urinary Tract: Negative adrenals. No hydronephrosis or stone. Unremarkable bladder. Stomach/Bowel:  No obstruction. No appendicitis. Vascular/Lymphatic: No acute vascular abnormality. No mass or adenopathy. Reproductive:No pathologic findings. Other: Generalized low-density ascites which is large volume. Musculoskeletal: No acute abnormalities. Remote and healed left posterior rib fractures. Avascular necrosis of the femoral heads with hip degeneration. IMPRESSION: 1. No acute finding or change from 1 month prior. 2. Cirrhosis with large volume  ascites. 3. Cholelithiasis. Electronically Signed   By: Tiburcio Pea M.D.   On: Jan 04, 2023 07:19   DG Abdomen 1 View  Result Date: Jan 04, 2023 CLINICAL DATA:  82 year old male with history of abdominal pain and abdominal distention and tenderness. EXAM: ABDOMEN - 1 VIEW COMPARISON:  Abdominal radiograph 10/28/2022. FINDINGS: No pathologic dilatation of small bowel. Gas and stool are noted in the colon and rectum. No definite pneumoperitoneum. Increased density over the abdomen suggesting ascites. IMPRESSION: 1. Nonspecific, nonobstructive bowel gas pattern, as above. 2. Probable ascites. Electronically Signed   By: Trudie Reed M.D.   On: 01-04-23 05:56   DG Chest Portable 1 View  Result Date: 01/04/2023 CLINICAL DATA:  82 year old male with history of abdominal pain and distention. EXAM: PORTABLE CHEST 1 VIEW COMPARISON:  Chest x-ray 10/26/2022. FINDINGS: Lung volumes are low. There is cephalization of the pulmonary vasculature and slight indistinctness of the interstitial markings suggestive of mild pulmonary edema. Small bilateral pleural effusions. No pneumothorax. Heart size is mildly enlarged. Upper mediastinal contours are within normal limits. Old healed fracture of the posterolateral right seventh rib incidentally noted. IMPRESSION: 1. The appearance of the chest suggests mild congestive heart failure, as above. 2. Low lung volumes. Electronically Signed   By: Trudie Reed M.D.   On: 01/04/2023 05:54    Procedures .Critical Care  Performed by: Marily Memos, MD Authorized by: Marily Memos, MD   Critical care provider statement:    Critical care time (minutes):  30   Critical care was necessary to treat or prevent imminent or life-threatening deterioration of the following conditions:  Endocrine crisis   Critical care was time spent personally by me on the following activities:  Development of treatment plan with patient or surrogate, discussions with consultants, evaluation of  patient's response to treatment, examination of patient, ordering and review of laboratory studies, ordering and review of radiographic studies, ordering and performing treatments and interventions, pulse oximetry, re-evaluation of patient's condition and review of old charts     Medications Ordered in ED Medications  mometasone-formoterol (DULERA) 200-5 MCG/ACT inhaler 2 puff (2 puffs Inhalation Not Given 01-04-23 2030)  albuterol (PROVENTIL) (2.5 MG/3ML) 0.083% nebulizer solution 2.5 mg (has no administration in time range)  HYDROmorphone (DILAUDID) injection 1 mg (1 mg Intravenous Given 2023-01-04 1851)  cefTRIAXone (ROCEPHIN) 2 g in sodium chloride 0.9 % 100 mL IVPB (has no administration in time range)  lactulose (CHRONULAC) enema 200 gm (has no administration in time range)  metroNIDAZOLE (FLAGYL) IVPB 500 mg (500 mg Intravenous New Bag/Given 2023-01-04 2202)  midodrine (PROAMATINE) tablet 10 mg (10 mg Oral Not Given 01-04-23 1343)  HYDROmorphone (DILAUDID) injection 1 mg (1 mg Intravenous Given 2023/01/04 0536)  lactated ringers bolus 500 mL (0 mLs Intravenous Stopped 04-Jan-2023 0654)  cefTRIAXone (ROCEPHIN) 2 g in sodium chloride 0.9 % 100 mL IVPB (0 g Intravenous Stopped 01/04/23 0809)  HYDROmorphone (DILAUDID) injection 1 mg (1 mg Intravenous Given 12-21-22 0751)  furosemide (LASIX) injection 40 mg (40 mg Intravenous Given 12-21-2022 0953)  HYDROmorphone (DILAUDID) injection 0.5 mg (0.5 mg Intravenous Given 21-Dec-2022 1017)    ED Course/ Medical Decision Making/ A&P                             Medical Decision Making Amount and/or Complexity of Data Reviewed Labs: ordered. Radiology: ordered.  Risk Prescription drug management. Decision regarding hospitalization.   Patient CT scan without any evidence of pancreas changes or gallbladder changes.  Lipase is elevated concern for pancreatitis reviewed.  Pains well-controlled for an hour or 2 on Dilaudid and then needs a second dose.  Empirically  covered with Rocephin in case of possible SBP although no white count, no fever and the sudden onset make a little bit less likely.  Will discuss with medicine for admission.   Final Clinical Impression(s) / ED Diagnoses Final diagnoses:  Generalized abdominal pain  Acute pancreatitis, unspecified complication status, unspecified pancreatitis type    Rx / DC Orders ED Discharge Orders     None         Shigeru Lampert, Barbara Cower, MD 11/30/2022 [phone]

## 2022-12-15 NOTE — Assessment & Plan Note (Signed)
Lipase 287.  While he could have acute pancreatitis, especially given his exquisite pain on exam, it is more likely secondary to his ascites. Also, chronic liver disease/renal failure may be contributing to elevation in lipase. Reassuringly, CT abdomen/pelvis did not show any acute changes of the pancreas at this time. - No further labs at this time

## 2022-12-15 NOTE — Evaluation (Signed)
SLP Cancellation Note  Patient Details Name: Donald Moreno MRN: [id number] DOB: 1940-08-21   Cancelled treatment:       Reason Eval/Treat Not Completed: Other (comment) (order discontinued by MD, not pt approaching EOL, please reconsult if indicated) Rolena Infante, MS [facility] SLP Acute Rehab Services Office [phone]   Chales Abrahams 11/30/2022, 8:43 AM

## 2022-12-15 DEATH — deceased
